# Patient Record
Sex: Male | Born: 1990
Health system: Southern US, Community
[De-identification: ages and names within clinical notes are randomized; demographics above are authoritative.]

## PROBLEM LIST (undated history)

## (undated) ENCOUNTER — Emergency Department (HOSPITAL_BASED_OUTPATIENT_CLINIC_OR_DEPARTMENT_OTHER): Admission: EM | Payer: Medicaid Other | Source: Home / Self Care

## (undated) DIAGNOSIS — Z765 Malingerer [conscious simulation]: Secondary | ICD-10-CM

## (undated) DIAGNOSIS — D571 Sickle-cell disease without crisis: Secondary | ICD-10-CM

## (undated) DIAGNOSIS — F17200 Nicotine dependence, unspecified, uncomplicated: Secondary | ICD-10-CM

## (undated) DIAGNOSIS — G894 Chronic pain syndrome: Secondary | ICD-10-CM

## (undated) DIAGNOSIS — F1910 Other psychoactive substance abuse, uncomplicated: Secondary | ICD-10-CM

## (undated) DIAGNOSIS — F1410 Cocaine abuse, uncomplicated: Secondary | ICD-10-CM

## (undated) HISTORY — PX: CHOLECYSTECTOMY: SHX55

## (undated) HISTORY — DX: Other psychoactive substance abuse, uncomplicated: F19.10

## (undated) HISTORY — DX: Nicotine dependence, unspecified, uncomplicated: F17.200

## (undated) HISTORY — DX: Malingerer (conscious simulation): Z76.5

## (undated) HISTORY — DX: Cocaine abuse, uncomplicated: F14.10

## (undated) HISTORY — DX: Chronic pain syndrome: G89.4

## (undated) HISTORY — PX: OTHER SURGICAL HISTORY: SHX169

---

## 2014-09-23 ENCOUNTER — Emergency Department (HOSPITAL_COMMUNITY): Payer: Self-pay

## 2014-09-23 ENCOUNTER — Emergency Department (HOSPITAL_COMMUNITY)
Admission: EM | Admit: 2014-09-23 | Discharge: 2014-09-24 | Discharge status: Against Medical Advice | Payer: Self-pay | Attending: Emergency Medicine | Admitting: Emergency Medicine

## 2014-09-23 ENCOUNTER — Encounter (HOSPITAL_COMMUNITY): Payer: Self-pay | Admitting: Emergency Medicine

## 2014-09-23 DIAGNOSIS — D5700 Hb-SS disease with crisis, unspecified: Secondary | ICD-10-CM

## 2014-09-23 DIAGNOSIS — J159 Unspecified bacterial pneumonia: Secondary | ICD-10-CM | POA: Insufficient documentation

## 2014-09-23 DIAGNOSIS — Z720 Tobacco use: Secondary | ICD-10-CM | POA: Insufficient documentation

## 2014-09-23 DIAGNOSIS — Z79899 Other long term (current) drug therapy: Secondary | ICD-10-CM | POA: Insufficient documentation

## 2014-09-23 DIAGNOSIS — J189 Pneumonia, unspecified organism: Secondary | ICD-10-CM

## 2014-09-23 LAB — CBC WITH DIFFERENTIAL/PLATELET
Basophils Absolute: 0.2 10*3/uL — ABNORMAL HIGH (ref 0.0–0.1)
Basophils Relative: 1 % (ref 0–1)
EOS ABS: 1.1 10*3/uL — AB (ref 0.0–0.7)
EOS PCT: 6 % — AB (ref 0–5)
HEMATOCRIT: 21.3 % — AB (ref 39.0–52.0)
Hemoglobin: 7.6 g/dL — ABNORMAL LOW (ref 13.0–17.0)
Lymphocytes Relative: 26 % (ref 12–46)
Lymphs Abs: 4.6 10*3/uL — ABNORMAL HIGH (ref 0.7–4.0)
MCH: 32.9 pg (ref 26.0–34.0)
MCHC: 35.7 g/dL (ref 30.0–36.0)
MCV: 92.2 fL (ref 78.0–100.0)
MONO ABS: 1.9 10*3/uL — AB (ref 0.1–1.0)
Monocytes Relative: 11 % (ref 3–12)
NEUTROS ABS: 9.8 10*3/uL — AB (ref 1.7–7.7)
NEUTROS PCT: 56 % (ref 43–77)
Platelets: 301 10*3/uL (ref 150–400)
RBC: 2.31 MIL/uL — AB (ref 4.22–5.81)
RDW: 21.6 % — AB (ref 11.5–15.5)
WBC: 17.6 10*3/uL — ABNORMAL HIGH (ref 4.0–10.5)

## 2014-09-23 LAB — RETICULOCYTES
RBC.: 2.32 MIL/uL — ABNORMAL LOW (ref 4.22–5.81)
RETIC CT PCT: 19.7 % — AB (ref 0.4–3.1)
Retic Count, Absolute: 457 10*3/uL — ABNORMAL HIGH (ref 19.0–186.0)

## 2014-09-23 LAB — URINALYSIS, ROUTINE W REFLEX MICROSCOPIC
Bilirubin Urine: NEGATIVE
GLUCOSE, UA: NEGATIVE mg/dL
Hgb urine dipstick: NEGATIVE
KETONES UR: NEGATIVE mg/dL
Leukocytes, UA: NEGATIVE
NITRITE: NEGATIVE
PROTEIN: NEGATIVE mg/dL
Specific Gravity, Urine: 1.013 (ref 1.005–1.030)
Urobilinogen, UA: 2 mg/dL — ABNORMAL HIGH (ref 0.0–1.0)
pH: 6.5 (ref 5.0–8.0)

## 2014-09-23 MED ORDER — SODIUM CHLORIDE 0.9 % IV BOLUS (SEPSIS)
1000.0000 mL | Freq: Once | INTRAVENOUS | Status: AC
  Administered 2014-09-23: 1000 mL via INTRAVENOUS

## 2014-09-23 MED ORDER — MORPHINE SULFATE 4 MG/ML IJ SOLN
4.0000 mg | Freq: Once | INTRAMUSCULAR | Status: AC
  Administered 2014-09-23: 4 mg via INTRAVENOUS
  Filled 2014-09-23 (×2): qty 1

## 2014-09-23 MED ORDER — DIPHENHYDRAMINE HCL 25 MG PO CAPS
25.0000 mg | ORAL_CAPSULE | Freq: Once | ORAL | Status: AC
  Administered 2014-09-23: 25 mg via ORAL
  Filled 2014-09-23: qty 1

## 2014-09-23 MED ORDER — AZITHROMYCIN 500 MG IV SOLR
500.0000 mg | Freq: Once | INTRAVENOUS | Status: AC
  Administered 2014-09-23: 500 mg via INTRAVENOUS
  Filled 2014-09-23: qty 500

## 2014-09-23 MED ORDER — CEFTRIAXONE SODIUM 1 G IJ SOLR
1.0000 g | Freq: Once | INTRAMUSCULAR | Status: AC
  Administered 2014-09-23: 1 g via INTRAVENOUS
  Filled 2014-09-23: qty 10

## 2014-09-23 MED ORDER — HYDROMORPHONE HCL 1 MG/ML IJ SOLN
1.0000 mg | Freq: Once | INTRAMUSCULAR | Status: AC
  Administered 2014-09-23: 1 mg via INTRAVENOUS
  Filled 2014-09-23: qty 1
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
## 2014-09-23 NOTE — ED Notes (Signed)
Pt requesting more pain medication.

## 2014-09-23 NOTE — ED Notes (Signed)
Pt from home c/o back pain and leg pain. HX of sickle cell. He states "I feel dehydrated". He reports he has not taken any medication at home for pain.

## 2014-09-24 ENCOUNTER — Encounter (HOSPITAL_COMMUNITY): Payer: Self-pay

## 2014-09-24 ENCOUNTER — Emergency Department (HOSPITAL_COMMUNITY): Payer: Medicaid - Out of State

## 2014-09-24 ENCOUNTER — Inpatient Hospital Stay (HOSPITAL_COMMUNITY)
Admission: EM | Admit: 2014-09-24 | Discharge: 2014-09-26 | DRG: 811 | Disposition: A | Discharge status: Home / Self Care | Payer: Medicaid - Out of State | Attending: Internal Medicine | Admitting: Internal Medicine

## 2014-09-24 DIAGNOSIS — Z720 Tobacco use: Secondary | ICD-10-CM | POA: Diagnosis not present

## 2014-09-24 DIAGNOSIS — Z79899 Other long term (current) drug therapy: Secondary | ICD-10-CM

## 2014-09-24 DIAGNOSIS — J189 Pneumonia, unspecified organism: Secondary | ICD-10-CM | POA: Diagnosis present

## 2014-09-24 DIAGNOSIS — D571 Sickle-cell disease without crisis: Secondary | ICD-10-CM

## 2014-09-24 DIAGNOSIS — Z79891 Long term (current) use of opiate analgesic: Secondary | ICD-10-CM | POA: Diagnosis not present

## 2014-09-24 DIAGNOSIS — D5700 Hb-SS disease with crisis, unspecified: Secondary | ICD-10-CM | POA: Diagnosis present

## 2014-09-24 DIAGNOSIS — Z681 Body mass index (BMI) 19 or less, adult: Secondary | ICD-10-CM

## 2014-09-24 DIAGNOSIS — D72829 Elevated white blood cell count, unspecified: Secondary | ICD-10-CM | POA: Diagnosis not present

## 2014-09-24 DIAGNOSIS — R000 Tachycardia, unspecified: Secondary | ICD-10-CM | POA: Diagnosis present

## 2014-09-24 DIAGNOSIS — R0902 Hypoxemia: Secondary | ICD-10-CM

## 2014-09-24 DIAGNOSIS — F17210 Nicotine dependence, cigarettes, uncomplicated: Secondary | ICD-10-CM | POA: Diagnosis present

## 2014-09-24 HISTORY — DX: Sickle-cell disease without crisis: D57.1

## 2014-09-24 LAB — CBC WITH DIFFERENTIAL/PLATELET
BASOS PCT: 0 % (ref 0–1)
Basophils Absolute: 0 10*3/uL (ref 0.0–0.1)
EOS PCT: 6 % — AB (ref 0–5)
Eosinophils Absolute: 1 10*3/uL — ABNORMAL HIGH (ref 0.0–0.7)
HCT: 22.5 % — ABNORMAL LOW (ref 39.0–52.0)
HEMOGLOBIN: 7.8 g/dL — AB (ref 13.0–17.0)
LYMPHS PCT: 24 % (ref 12–46)
Lymphs Abs: 3.9 10*3/uL (ref 0.7–4.0)
MCH: 32 pg (ref 26.0–34.0)
MCHC: 34.7 g/dL (ref 30.0–36.0)
MCV: 92.2 fL (ref 78.0–100.0)
MONO ABS: 1.4 10*3/uL — AB (ref 0.1–1.0)
Monocytes Relative: 9 % (ref 3–12)
NEUTROS PCT: 61 % (ref 43–77)
Neutro Abs: 9.8 10*3/uL — ABNORMAL HIGH (ref 1.7–7.7)
Platelets: 307 10*3/uL (ref 150–400)
RBC: 2.44 MIL/uL — ABNORMAL LOW (ref 4.22–5.81)
RDW: 21.4 % — ABNORMAL HIGH (ref 11.5–15.5)
WBC: 16.1 10*3/uL — ABNORMAL HIGH (ref 4.0–10.5)

## 2014-09-24 LAB — BASIC METABOLIC PANEL
Anion gap: 8 (ref 5–15)
BUN: 5 mg/dL — AB (ref 6–20)
CHLORIDE: 107 mmol/L (ref 101–111)
CO2: 24 mmol/L (ref 22–32)
Calcium: 9.3 mg/dL (ref 8.9–10.3)
Creatinine, Ser: 0.53 mg/dL — ABNORMAL LOW (ref 0.61–1.24)
GFR calc Af Amer: >60 mL/min (ref >60)
GFR calc non Af Amer: >60 mL/min (ref >60)
GLUCOSE: 89 mg/dL (ref 65–99)
POTASSIUM: 3.6 mmol/L (ref 3.5–5.1)
Sodium: 139 mmol/L (ref 135–145)

## 2014-09-24 LAB — HEPATIC FUNCTION PANEL
ALT: 15 U/L — ABNORMAL LOW (ref 17–63)
AST: 22 U/L (ref 15–41)
Albumin: 4.2 g/dL (ref 3.5–5.0)
Alkaline Phosphatase: 82 U/L (ref 38–126)
BILIRUBIN DIRECT: 0.2 mg/dL (ref 0.1–0.5)
BILIRUBIN TOTAL: 3.5 mg/dL — AB (ref 0.3–1.2)
Indirect Bilirubin: 3.3 mg/dL — ABNORMAL HIGH (ref 0.3–0.9)
Total Protein: 7.8 g/dL (ref 6.5–8.1)

## 2014-09-24 LAB — RETICULOCYTES
RBC.: 2.44 MIL/uL — ABNORMAL LOW (ref 4.22–5.81)
Retic Count, Absolute: 468.5 10*3/uL — ABNORMAL HIGH (ref 19.0–186.0)
Retic Ct Pct: 19.2 % — ABNORMAL HIGH (ref 0.4–3.1)

## 2014-09-24 LAB — LACTATE DEHYDROGENASE: LDH: 237 U/L — AB (ref 98–192)

## 2014-09-24 MED ORDER — ENOXAPARIN SODIUM 40 MG/0.4ML ~~LOC~~ SOLN
40.0000 mg | SUBCUTANEOUS | Status: DC
  Filled 2014-09-24 (×2): qty 0.4

## 2014-09-24 MED ORDER — DIPHENHYDRAMINE HCL 25 MG PO CAPS
50.0000 mg | ORAL_CAPSULE | Freq: Once | ORAL | Status: AC
  Administered 2014-09-24: 50 mg via ORAL
  Filled 2014-09-24: qty 2

## 2014-09-24 MED ORDER — HYDROMORPHONE HCL 1 MG/ML IJ SOLN
1.0000 mg | Freq: Once | INTRAMUSCULAR | Status: DC
Start: 2014-09-24 — End: 2014-09-25

## 2014-09-24 MED ORDER — DIPHENHYDRAMINE HCL 12.5 MG/5ML PO ELIX: 12.5000 mg | ORAL_SOLUTION | Freq: Four times a day (QID) | ORAL | Status: DC | PRN

## 2014-09-24 MED ORDER — NALOXONE HCL 0.4 MG/ML IJ SOLN: 0.4000 mg | INTRAMUSCULAR | Status: DC | PRN

## 2014-09-24 MED ORDER — DEXTROSE 5 % IV SOLN
500.0000 mg | Freq: Once | INTRAVENOUS | Status: AC
  Administered 2014-09-24: 500 mg via INTRAVENOUS
  Filled 2014-09-24: qty 500

## 2014-09-24 MED ORDER — DIPHENHYDRAMINE HCL 50 MG/ML IJ SOLN
12.5000 mg | Freq: Four times a day (QID) | INTRAMUSCULAR | Status: DC | PRN
  Administered 2014-09-25 (×2): 12.5 mg via INTRAVENOUS
  Filled 2014-09-24 (×2): qty 1

## 2014-09-24 MED ORDER — FOLIC ACID 1 MG PO TABS
1.0000 mg | ORAL_TABLET | Freq: Every day | ORAL | Status: DC
  Administered 2014-09-24 – 2014-09-26 (×3): 1 mg via ORAL
  Filled 2014-09-24 (×3): qty 1

## 2014-09-24 MED ORDER — HYDROMORPHONE HCL 1 MG/ML IJ SOLN
1.0000 mg | Freq: Once | INTRAMUSCULAR | Status: AC
  Administered 2014-09-24: 1 mg via INTRAVENOUS
  Filled 2014-09-24: qty 1

## 2014-09-24 MED ORDER — SODIUM CHLORIDE 0.9 % IV BOLUS (SEPSIS)
1000.0000 mL | Freq: Once | INTRAVENOUS | Status: AC
  Administered 2014-09-24: 1000 mL via INTRAVENOUS

## 2014-09-24 MED ORDER — HYDROMORPHONE HCL 1 MG/ML IJ SOLN: 1.0000 mg | INTRAMUSCULAR | Status: DC | PRN

## 2014-09-24 MED ORDER — ACETAMINOPHEN 325 MG PO TABS: 650.0000 mg | ORAL_TABLET | Freq: Four times a day (QID) | ORAL | Status: DC | PRN

## 2014-09-24 MED ORDER — DEXTROSE 5 % IV SOLN
1.0000 g | INTRAVENOUS | Status: DC
  Filled 2014-09-24: qty 10

## 2014-09-24 MED ORDER — SODIUM CHLORIDE 0.9 % IV SOLN: INTRAVENOUS | Status: DC

## 2014-09-24 MED ORDER — DEXTROSE 5 % IV SOLN
1.0000 g | Freq: Once | INTRAVENOUS | Status: AC
  Administered 2014-09-24: 1 g via INTRAVENOUS
  Filled 2014-09-24: qty 10

## 2014-09-24 MED ORDER — SODIUM CHLORIDE 0.9 % IJ SOLN
3.0000 mL | Freq: Two times a day (BID) | INTRAMUSCULAR | Status: DC
  Administered 2014-09-24 – 2014-09-25 (×2): 3 mL via INTRAVENOUS

## 2014-09-24 MED ORDER — OXYCODONE HCL 5 MG PO TABS
10.0000 mg | ORAL_TABLET | ORAL | Status: DC
  Administered 2014-09-24 – 2014-09-25 (×4): 10 mg via ORAL
  Filled 2014-09-24 (×4): qty 2

## 2014-09-24 MED ORDER — SODIUM CHLORIDE 0.9 % IV SOLN
INTRAVENOUS | Status: DC
  Administered 2014-09-24: 20:00:00 via INTRAVENOUS

## 2014-09-24 MED ORDER — ACETAMINOPHEN 650 MG RE SUPP
650.0000 mg | Freq: Four times a day (QID) | RECTAL | Status: DC | PRN
Start: 2014-09-24 — End: 2014-09-26

## 2014-09-24 MED ORDER — SODIUM CHLORIDE 0.9 % IJ SOLN: 9.0000 mL | INTRAMUSCULAR | Status: DC | PRN

## 2014-09-24 MED ORDER — AZITHROMYCIN 500 MG PO TABS
500.0000 mg | ORAL_TABLET | ORAL | Status: DC
  Filled 2014-09-24: qty 1

## 2014-09-24 MED ORDER — ONDANSETRON HCL 4 MG/2ML IJ SOLN: 4.0000 mg | Freq: Four times a day (QID) | INTRAMUSCULAR | Status: DC | PRN

## 2014-09-24 MED ORDER — HYDROMORPHONE 0.3 MG/ML IV SOLN
INTRAVENOUS | Status: DC
  Administered 2014-09-24: 0.3 mg via INTRAVENOUS
  Administered 2014-09-25: 2.4 mg via INTRAVENOUS
  Administered 2014-09-25: 3 mg via INTRAVENOUS
  Administered 2014-09-25: 0.9 mg via INTRAVENOUS
  Administered 2014-09-25: 4.2 mg via INTRAVENOUS
  Administered 2014-09-25: 2.1 mg via INTRAVENOUS
  Filled 2014-09-24 (×2): qty 25

## 2014-09-24 NOTE — ED Notes (Signed)
Pt can go at 18:25
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
## 2014-09-24 NOTE — ED Notes (Signed)
MD at bedside. 

## 2014-09-24 NOTE — Progress Notes (Addendum)
24 yr old [place] of [place] moved to [place] in the last week  Reports support system is his step father  Pt confirms no pcp and he has not made attempt to change medicaid from [facility] to [facility]  CM discussed with pt that he does need to go to or call DSS to assist with coverage prior to being changed to uninsured [employer]  CM discussed and provided written information for uninsured and medicaid accepting pcps, [facility] contact information, discussed the importance of pcp vs EDP services for f/u care, www.[email or web address], www.goodrx.com, discounted pharmacies and other [place] resources such as [employer] [facility] [facility], financial assistance,DSS and  health department  Reviewed resources for [employer] accepting pcps like [facility], family medicine at [facility], [facility], [facility], local urgent care centers, [facility], [facility] family practice, general medical clinics, family services of the [place], [facility] urgent care plus others, medication resources, [facility] out [facility] and housing Pt voiced understanding and appreciation of resources provided

## 2014-09-24 NOTE — ED Notes (Signed)
Pt seen yesterday.  Pt was given meds and sent home.  Pt has had back and leg pain x 3 days.  Sickle cell.  No shortness of breath or chest pain
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
## 2014-09-24 NOTE — ED Notes (Signed)
EDP spoke with pt about being admitted for sickle cell crisis and pneumonia. Pt refused. Pt states he understands the risks of refusing treatment. Pt understands the complications of pneumonia as well as sickle cell disease.

## 2014-09-24 NOTE — Progress Notes (Signed)
   I went to see the patient and introduced myself. I told him that the emergency room providers that saw him earlier have recommended for him to stay admitted at the hospital to treat his pneumonia and sickle cell crisis. He declined to be admitted and stated that he wanted to try to outpatient therapy first. He understands the risks of not getting treatment in the hospital, but states that if he fails outpatient treatment and he will return to the ED as soon as possible. I notified the provider that saw him earlier in the ED, Joycie Peek, PA-C, of the patient's decision.

## 2014-09-25 ENCOUNTER — Inpatient Hospital Stay (HOSPITAL_COMMUNITY): Payer: Medicaid - Out of State

## 2014-09-25 DIAGNOSIS — D5700 Hb-SS disease with crisis, unspecified: Principal | ICD-10-CM

## 2014-09-25 DIAGNOSIS — D72829 Elevated white blood cell count, unspecified: Secondary | ICD-10-CM

## 2014-09-25 DIAGNOSIS — Z720 Tobacco use: Secondary | ICD-10-CM

## 2014-09-25 DIAGNOSIS — J189 Pneumonia, unspecified organism: Secondary | ICD-10-CM

## 2014-09-25 DIAGNOSIS — R0902 Hypoxemia: Secondary | ICD-10-CM

## 2014-09-25 LAB — HIV ANTIBODY (ROUTINE TESTING W REFLEX): HIV SCREEN 4TH GENERATION: NONREACTIVE

## 2014-09-25 LAB — COMPREHENSIVE METABOLIC PANEL
ALT: 12 U/L — AB (ref 17–63)
ANION GAP: 4 — AB (ref 5–15)
AST: 17 U/L (ref 15–41)
Albumin: 3.7 g/dL (ref 3.5–5.0)
Alkaline Phosphatase: 78 U/L (ref 38–126)
BUN: 5 mg/dL — ABNORMAL LOW (ref 6–20)
CHLORIDE: 109 mmol/L (ref 101–111)
CO2: 27 mmol/L (ref 22–32)
Calcium: 8.9 mg/dL (ref 8.9–10.3)
Creatinine, Ser: 0.52 mg/dL — ABNORMAL LOW (ref 0.61–1.24)
GFR calc non Af Amer: >60 mL/min (ref >60)
Glucose, Bld: 88 mg/dL (ref 65–99)
POTASSIUM: 3.5 mmol/L (ref 3.5–5.1)
SODIUM: 140 mmol/L (ref 135–145)
Total Bilirubin: 3.3 mg/dL — ABNORMAL HIGH (ref 0.3–1.2)
Total Protein: 6.9 g/dL (ref 6.5–8.1)

## 2014-09-25 LAB — CBC
HCT: 19.9 % — ABNORMAL LOW (ref 39.0–52.0)
HEMOGLOBIN: 7 g/dL — AB (ref 13.0–17.0)
MCH: 32.6 pg (ref 26.0–34.0)
MCHC: 35.2 g/dL (ref 30.0–36.0)
MCV: 92.6 fL (ref 78.0–100.0)
Platelets: 262 10*3/uL (ref 150–400)
RBC: 2.15 MIL/uL — AB (ref 4.22–5.81)
RDW: 21.5 % — ABNORMAL HIGH (ref 11.5–15.5)
WBC: 17.3 10*3/uL — ABNORMAL HIGH (ref 4.0–10.5)

## 2014-09-25 MED ORDER — SODIUM CHLORIDE 0.9 % IV SOLN
25.0000 mg | INTRAVENOUS | Status: DC | PRN
  Administered 2014-09-25 – 2014-09-26 (×3): 25 mg via INTRAVENOUS
  Filled 2014-09-25 (×6): qty 0.5

## 2014-09-25 MED ORDER — POLYETHYLENE GLYCOL 3350 17 G PO PACK: 17.0000 g | PACK | Freq: Every day | ORAL | Status: DC | PRN

## 2014-09-25 MED ORDER — KETOROLAC TROMETHAMINE 30 MG/ML IJ SOLN
30.0000 mg | Freq: Four times a day (QID) | INTRAMUSCULAR | Status: DC
  Filled 2014-09-25 (×5): qty 1

## 2014-09-25 MED ORDER — LEVOFLOXACIN 750 MG PO TABS
750.0000 mg | ORAL_TABLET | Freq: Every day | ORAL | Status: DC
  Administered 2014-09-25 – 2014-09-26 (×2): 750 mg via ORAL
  Filled 2014-09-25 (×2): qty 1

## 2014-09-25 MED ORDER — SENNOSIDES-DOCUSATE SODIUM 8.6-50 MG PO TABS
1.0000 | ORAL_TABLET | Freq: Two times a day (BID) | ORAL | Status: DC
  Administered 2014-09-25 (×2): 1 via ORAL
  Filled 2014-09-25 (×3): qty 1

## 2014-09-25 MED ORDER — HYDROMORPHONE HCL 1 MG/ML IJ SOLN
1.0000 mg | INTRAMUSCULAR | Status: DC | PRN
  Administered 2014-09-25 – 2014-09-26 (×4): 1 mg via INTRAVENOUS
  Filled 2014-09-25 (×5): qty 1

## 2014-09-25 MED ORDER — DIPHENHYDRAMINE HCL 25 MG PO CAPS
25.0000 mg | ORAL_CAPSULE | Freq: Four times a day (QID) | ORAL | Status: DC | PRN
  Filled 2014-09-25: qty 1

## 2014-09-25 MED ORDER — OXYCODONE HCL 5 MG PO TABS
10.0000 mg | ORAL_TABLET | ORAL | Status: DC
  Administered 2014-09-25 – 2014-09-26 (×7): 10 mg via ORAL
  Filled 2014-09-25 (×7): qty 2

## 2014-09-25 NOTE — Progress Notes (Signed)
SICKLE CELL SERVICE PROGRESS NOTE  Matthew Shannon JYN:[id number] DOB: 06-21-1990 DOA: 09/24/2014 PCP: No primary care provider on file.  Assessment/Plan: Principal Problem:   Sickle cell pain crisis Active Problems:   Community acquired pneumonia   Tobacco abuse   CAP (community acquired pneumonia)  1. Hb SS with crisis  Pt reports Hb [place] which is unverified at this time. He reports that he has pain localized to his low back, throbbing in nature and at an intensity of 4/10 which    Is his usual baseline. He has used 6.3 mg with 123/21:demands/deliveries. I will add Toradol, Schedule Oxycodone 10 mg every 4 hours.and decrease IVF as patient is eating and drinking well. He is requesting to be discharged before 1:00pm this afternoon as he has his Grandmother's  funeral to attend. I will re-assess in 3 hours and make a decision about discharge. Will also obtain electrophorsis for establishing diagnosis and baseline Hb type levels. 2. Suspect CAP: Pt has an elevated WBC and had a CXR which showed scarring VS infiltrate. He denies cough or fever. However he was started on Azithromycin and Rocephin. I will de-escalate therapy to Levaquin as his severity of illness does not warrant IV antibiotics. 3. Tobacco Use Disorder: Pt smokes about 4 cigarettes/day. He has been counseled against tobacco use particularly in the context of his disease.  4. Psychosocial: Pt reports that he moved from [facility] to [place] in the last week so that he can be a patient of the [facility] as he has been discharged from the care of his Hematologist Dr. Henry Russel because of violation of his pain medication contract.   Code Status: Full Code Family Communication: N/A Disposition Plan: Will re-assess fro discharge this afternoon.  MATTHEWS,[name]  Pager [phone]. If 7PM-7AM, please contact night-coverage.  09/25/2014, 9:47 AM  LOS: 1 day  Pertinent  History: Pt reports a h/o Hb SS with hospitalizations about 4-5 x year. He  denies any h/o acute chest syndrome, Splenic sequestration,. Priapism. He does not know his baseline Hb, WBC or reticulocyte count. I spoke Dr. Henry Russel at [facility] who verifies that patient has HB SS and not Hb [place] a originally reported by patient. We will obtain records to document his baseline indices.  Consultants:  None  Procedures:  None  Antibiotics:  Azithromycin 8/15 >>8/16  Rocephin 8/15 >>8/16  Levaquin 8/16 >>  HPI/Subjective: Pt reports pain currently at 4/10 ( baseline) and localized to back and legs. Throbbing and non-radiating in nature.  He denies cough, fever or chills. He is requesting discharge befor 1:00pm today to attend his Grandmother's funeral.   Objective: Filed Vitals:   09/25/14 0400 09/25/14 0426 09/25/14 0443 09/25/14 0815  BP:  104/62    Pulse:  78    Temp:  97.9 F (36.6 C)    TempSrc:  Oral    Resp: Height:      Weight:      SpO2: 99% 96% 99% 99%   Weight change:   Intake/Output Summary (Last 24 hours) at 09/25/14 0947 Last data filed at 09/25/14 0654  Gross per 24 hour  Intake 813.75 ml  Output      0 ml  Net 813.75 ml    General: Alert, awake, oriented x3, in no acute distress.  HEENT: /AT PEERL, EOMI, anicteric Neck: Trachea midline,  no masses, no thyromegal,y no JVD, no carotid bruit OROPHARYNX:  Moist, No exudate/ erythema/lesions.  Heart: Regular rate  and rhythm, without murmurs, rubs, gallops, PMI non-displaced, no heaves or thrills on palpation.  Lungs: Crackles noted at RLL. No wheezing, no increased vocal fremitus. Lungs are resonant to percussion  Abdomen: Soft, nontender, nondistended, positive bowel sounds, no masses no hepatosplenomegaly noted.  Neuro: No focal neurological deficits noted cranial nerves II through XII grossly intact.  Strength 5/5 in bilateral upper and lower extremities. Musculoskeletal: No warm swelling or erythema around joints, no spinal tenderness  noted. Psychiatric: Patient alert and oriented x3, good insight and cognition, good recent to remote recall. Lymph node survey: No cervical axillary or inguinal lymphadenopathy noted.   Data Reviewed: Basic Metabolic Panel:  Recent Labs Lab 09/24/14 1535 09/25/14 0425  NA 139 140  K 3.6 3.5  CL 107 109  CO2 24 27  GLUCOSE 89 88  BUN 5* 5*  CREATININE 0.53* 0.52*  CALCIUM 9.3 8.9   Liver Function Tests:  Recent Labs Lab 09/24/14 1535 09/25/14 0425  AST 22 17  ALT 15* 12*  ALKPHOS 82 78  BILITOT 3.5* 3.3*  PROT 7.8 6.9  ALBUMIN 4.2 3.7   No results for input(s): LIPASE, AMYLASE in the last 168 hours. No results for input(s): AMMONIA in the last 168 hours. CBC:  Recent Labs Lab 09/23/14 2015 09/24/14 1535 09/25/14 0425  WBC 17.6* 16.1* 17.3*  NEUTROABS 9.8* 9.8*  --   HGB 7.6* 7.8* 7.0*  HCT 21.3* 22.5* 19.9*  MCV 92.2 92.2 92.6  PLT 301 307 262   Cardiac Enzymes: No results for input(s): CKTOTAL, CKMB, CKMBINDEX, TROPONINI in the last 168 hours. BNP (last 3 results) No results for input(s): BNP in the last 8760 hours.  ProBNP (last 3 results) No results for input(s): PROBNP in the last 8760 hours.  CBG: No results for input(s): GLUCAP in the last 168 hours.  No results found for this or any previous visit (from the past 240 hour(s)).   Studies: Dg Chest 2 View  09/24/2014   CLINICAL DATA:  Back pain for 3 days. Nonproductive cough. History of sickle cell disease.  EXAM: CHEST  2 VIEW  COMPARISON:  09/23/2014  FINDINGS: Heart is mildly enlarged. Airspace opacity in the right lung base is similar to prior study. This could be chronic, but cannot exclude developing infiltrate. Left lung is clear. No effusions. No acute bony abnormality.  IMPRESSION: Stable opacity at the right lung base of unknown chronicity. This could reflect scarring or developing infiltrate.   Electronically Signed   By: Charlett Nose M.D.   On: 09/24/2014 16:23   Dg Chest 2  View  09/23/2014   CLINICAL DATA:  Wheezing and congestion for 2 days  EXAM: CHEST - 2 VIEW  COMPARISON:  None.  FINDINGS: Cardiac shadow is within normal limits. A mild scoliosis concave to the left is noted in the mid thoracic spine. The lungs are well aerated bilaterally. Slight increased density is noted in the right lung base projecting in the lower lobe consistent with early infiltrate. No sizable effusion is seen. No other bony abnormality is noted.  IMPRESSION: Early infiltrate in the right lower lobe.   Electronically Signed   By: Alcide Clever M.D.   On: 09/23/2014 21:42    Scheduled Meds: . enoxaparin (LOVENOX) injection  40 mg Subcutaneous Q24H  . folic acid  1 mg Oral Daily  . HYDROmorphone PCA 0.3 mg/mL   Intravenous 6 times per day  . levofloxacin  750 mg Oral Daily  . oxyCODONE  10 mg Oral  Q4H  . senna-docusate  1 tablet Oral BID  . sodium chloride  3 mL Intravenous Q12H   Continuous Infusions: . sodium chloride 75 mL/hr at 09/24/14 2003   Time spent 40 minutes.

## 2014-09-25 NOTE — Progress Notes (Signed)
RN paged this NP secondary to pt leaving the floor multiple times to smoke in spite of being told not to. Both times, the RN has discussed the risks associated with him leaving the floor. He apologizes and does it again. Apparently, he did this yesterday as well. This NP communicated with hospital [facility], [place] Dec. Huntley Dec is going to talk with pt. Security is presently looking for the pt. In my opinion, if he is well enough to walk outside to smoke, he may be well enough for discharge. Jimmye Norman, NP [facility]

## 2014-09-25 NOTE — Care Management Note (Signed)
Case Management Note  Patient Details  Name: Matthew Shannon MRN: [id number] Date of Birth: 1990-02-19  Subjective/Objective:  24 y/o m admitted w/SSC. Recent move to .No pcp. Noted ED CM has provided w/pcp listing,med asst resources,& community resources.  [facility] to [facility] for pcp,& connection w/resources.                  Action/Plan:d/c plan home.   Expected Discharge Date:   (unknown)               Expected Discharge Plan:  Home/Self Care  In-House Referral:     Discharge planning Services  CM Consult  Post Acute Care Choice:    Choice offered to:     DME Arranged:    DME Agency:     HH Arranged:    HH Agency:     Status of Service:  In process, will continue to follow  Medicare Important Message Given:    Date Medicare IM Given:    Medicare IM give by:    Date Additional Medicare IM Given:    Additional Medicare Important Message give by:     If discussed at Long Length of Stay Meetings, dates discussed:    Additional Comments:  Lanier Clam, RN 09/25/2014, 3:26 PM

## 2014-09-25 NOTE — Progress Notes (Signed)
Entered patient room to round with Dr. Ashley Royalty. Patient had unhooked himself from the PCA pump and heart monitor to take a shower. RN was unaware of this. Educated patient on importance of not changing pump settings and the importance of not taking heart monitor off. Explained risk of infection when he disconnects himself from patient. Will continue to monitor patient.

## 2014-09-25 NOTE — Progress Notes (Signed)
Came back to re-assess patietn for possible discharge per his request. Pt had a decrease in saturation from 100% to 92% with ambulation and increase in HR to 110. In light of the possible developing infiltrate on CXR and  relative hypoxemia in a setting of Hb SS with crisis. I feel that his medical risk requires continued hospitalization. Additionally he is still requiring IV dilaudid in addition to oral Oxycodone to control pain. Will continue current treatment and re-assess tomorrow. All of the above discussed with patient in the presence of bedside nurse.

## 2014-09-25 NOTE — Progress Notes (Signed)
SATURATION QUALIFICATIONS: (This note is used to comply with regulatory documentation for home oxygen)  Patient Saturations on Room Air at Rest = 100%  Patient Saturations on Room Air while Ambulating = 92%  Patient Saturations on 0 Liters of oxygen while Ambulating = 92%  Please briefly explain why patient needs home oxygen: 

## 2014-09-25 NOTE — Progress Notes (Signed)
Talked with [name] about leaving the floor. I explained to him that when he signed in for treatment he was agreeing to follow the rules of [employer]. If he continues to leave the floor and not follow the rules, he will be asked to leave. He was told that his continuing treatment and care would be up to him. The patient voiced understanding and agrees to follow the rules. Verdene Rio, RN was present during this conversation.

## 2014-09-25 NOTE — Progress Notes (Signed)
Sent request to obtain medical records from patient previous HCP. Awaiting response.

## 2014-09-26 DIAGNOSIS — R000 Tachycardia, unspecified: Secondary | ICD-10-CM | POA: Diagnosis present

## 2014-09-26 LAB — BASIC METABOLIC PANEL
Anion gap: 7 (ref 5–15)
BUN: 5 mg/dL — ABNORMAL LOW (ref 6–20)
CO2: 26 mmol/L (ref 22–32)
Calcium: 8.8 mg/dL — ABNORMAL LOW (ref 8.9–10.3)
Chloride: 106 mmol/L (ref 101–111)
Creatinine, Ser: 0.49 mg/dL — ABNORMAL LOW (ref 0.61–1.24)
GFR calc Af Amer: >60 mL/min (ref >60)
GFR calc non Af Amer: >60 mL/min (ref >60)
Glucose, Bld: 82 mg/dL (ref 65–99)
Potassium: 3.6 mmol/L (ref 3.5–5.1)
Sodium: 139 mmol/L (ref 135–145)

## 2014-09-26 LAB — CBC WITH DIFFERENTIAL/PLATELET
BASOS ABS: 0.2 10*3/uL — AB (ref 0.0–0.1)
Basophils Relative: 1 % (ref 0–1)
EOS ABS: 0.9 10*3/uL — AB (ref 0.0–0.7)
EOS PCT: 6 % — AB (ref 0–5)
HCT: 18.5 % — ABNORMAL LOW (ref 39.0–52.0)
Hemoglobin: 6.3 g/dL — CL (ref 13.0–17.0)
LYMPHS ABS: 3.6 10*3/uL (ref 0.7–4.0)
Lymphocytes Relative: 24 % (ref 12–46)
MCH: 31.3 pg (ref 26.0–34.0)
MCHC: 34.1 g/dL (ref 30.0–36.0)
MCV: 92 fL (ref 78.0–100.0)
MONOS PCT: 13 % — AB (ref 3–12)
Monocytes Absolute: 2 10*3/uL — ABNORMAL HIGH (ref 0.1–1.0)
NEUTROS ABS: 8.5 10*3/uL — AB (ref 1.7–7.7)
NEUTROS PCT: 56 % (ref 43–77)
PLATELETS: 272 10*3/uL (ref 150–400)
RBC: 2.01 MIL/uL — AB (ref 4.22–5.81)
RDW: 22.9 % — ABNORMAL HIGH (ref 11.5–15.5)
WBC: 15.2 10*3/uL — AB (ref 4.0–10.5)
nRBC: 3 /100 WBC — ABNORMAL HIGH

## 2014-09-26 LAB — LACTATE DEHYDROGENASE: LDH: 259 U/L — AB (ref 98–192)

## 2014-09-26 MED ORDER — LEVOFLOXACIN 750 MG PO TABS: 750.0000 mg | ORAL_TABLET | Freq: Every day | ORAL | Status: DC

## 2014-09-26 MED ORDER — OXYCODONE HCL 10 MG PO TABS: 10.0000 mg | ORAL_TABLET | ORAL | Status: DC

## 2014-09-26 NOTE — Progress Notes (Signed)
Pt ambulated 200 ft in hall.  O2 sat was 96%, HR 86 at rest on RA.  O2 sat was 90-95% during ambulation with a HR of 88-99.  No complaints of SOB during ambulation.

## 2014-09-26 NOTE — Progress Notes (Signed)
CRITICAL VALUE ALERT  Critical value received:  6:39 AM  Date of notification:  09/26/2014  Time of notification:  6:38 AM  Critical value read back:Yes.    Nurse who received alert:  Rockne Coons   MD notified (1st page):  K.Kirby    Time of first page:  6:39 AM  MD notified (2nd page): K.Kirby  Time of second page: 6:45AM  Responding MD:    Time MD responded:

## 2014-09-26 NOTE — Care Management Note (Signed)
Case Management Note  Patient Details  Name: Kasai Beltran MRN: [id number] Date of Birth: March 22, 1990  Subjective/Objective:                    Action/Plan:d/c ome no needs or orders.   Expected Discharge Date:   (unknown)               Expected Discharge Plan:  Home/Self Care  In-House Referral:     Discharge planning Services  CM Consult  Post Acute Care Choice:    Choice offered to:     DME Arranged:    DME Agency:     HH Arranged:    HH Agency:     Status of Service:  Completed, signed off  Medicare Important Message Given:    Date Medicare IM Given:    Medicare IM give by:    Date Additional Medicare IM Given:    Additional Medicare Important Message give by:     If discussed at Long Length of Stay Meetings, dates discussed:    Additional Comments:  Lanier Clam, RN 09/26/2014, 10:20 AM

## 2014-09-26 NOTE — Discharge Summary (Signed)
Matthew Shannon MRN: [id number] DOB/AGE: 1990/06/03 24 y.o.  Admit date: 09/24/2014 Discharge date: 09/26/2014  Primary Care Physician:  No primary care provider on file.   Discharge Diagnoses:   Patient Active Problem List   Diagnosis Date Noted  . Tachycardia 09/26/2014  . Sickle cell pain crisis 09/24/2014  . Community acquired pneumonia 09/24/2014  . Tobacco abuse 09/24/2014  . CAP (community acquired pneumonia) 09/24/2014    DISCHARGE MEDICATION:   Medication List    STOP taking these medications        oxyCODONE-acetaminophen 5-325 MG per tablet  Commonly known as:  PERCOCET/ROXICET      TAKE these medications        folic acid 1 MG tablet  Commonly known as:  FOLVITE  Take 1 mg by mouth daily.     levofloxacin 750 MG tablet  Commonly known as:  LEVAQUIN  Take 1 tablet (750 mg total) by mouth daily.     Oxycodone HCl 10 MG Tabs  Take 1 tablet (10 mg total) by mouth every 4 (four) hours.          Consults:     SIGNIFICANT DIAGNOSTIC STUDIES:  Dg Chest 2 View  09/24/2014   CLINICAL DATA:  Back pain for 3 days. Nonproductive cough. History of sickle cell disease.  EXAM: CHEST  2 VIEW  COMPARISON:  09/23/2014  FINDINGS: Heart is mildly enlarged. Airspace opacity in the right lung base is similar to prior study. This could be chronic, but cannot exclude developing infiltrate. Left lung is clear. No effusions. No acute bony abnormality.  IMPRESSION: Stable opacity at the right lung base of unknown chronicity. This could reflect scarring or developing infiltrate.   Electronically Signed   By: Charlett Nose M.D.   On: 09/24/2014 16:23   Dg Chest 2 View  09/23/2014   CLINICAL DATA:  Wheezing and congestion for 2 days  EXAM: CHEST - 2 VIEW  COMPARISON:  None.  FINDINGS: Cardiac shadow is within normal limits. A mild scoliosis concave to the left is noted in the mid thoracic spine. The lungs are well aerated bilaterally. Slight increased density is noted in the right lung  base projecting in the lower lobe consistent with early infiltrate. No sizable effusion is seen. No other bony abnormality is noted.  IMPRESSION: Early infiltrate in the right lower lobe.   Electronically Signed   By: Alcide Clever M.D.   On: 09/23/2014 21:42   Dg Chest Port 1 View  09/25/2014   CLINICAL DATA:  Sickle cell anemia with current hypoxic episode, patient smokes  EXAM: PORTABLE CHEST - 1 VIEW  COMPARISON:  09/24/2014  FINDINGS: Sigmoid scoliotic curvature of the thoracolumbar spine stable. Mild to moderate cardiac enlargement stable. Moderate vascular congestion. Mild diffuse interstitial process bilaterally, increased in conspicuity when compared to prior study. No pleural effusions.  IMPRESSION: Cardiac enlargement with vascular congestion stable from prior study. Mildly increased diffuse interstitial opacification. No Ker[place] B-lines suggesting facial pneumonia however. Findings may be due to sickle cell crisis. Other possibilities include atypical pneumonia or progressive interstitial lung disease.   Electronically Signed   By: Esperanza Heir M.D.   On: 09/25/2014 21:47       Recent Results (from the past 240 hour(s))  Culture, blood (routine x 2) Call MD if unable to obtain prior to antibiotics being given     Status: None (Preliminary result)   Collection Time: 09/24/14  8:15 PM  Result Value Ref Range Status   Specimen  Description BLOOD LEFT ARM  Final   Special Requests BOTTLES DRAWN AEROBIC AND ANAEROBIC 5CC  Final   Culture   Final    NO GROWTH < 24 HOURS Performed at [facility]    Report Status PENDING  Incomplete  Culture, blood (routine x 2) Call MD if unable to obtain prior to antibiotics being given     Status: None (Preliminary result)   Collection Time: 09/24/14  8:20 PM  Result Value Ref Range Status   Specimen Description BLOOD RIGHT HAND  Final   Special Requests BOTTLES DRAWN AEROBIC AND ANAEROBIC 10CC  Final   Culture   Final    NO GROWTH < 24  HOURS Performed at [facility]    Report Status PENDING  Incomplete    BRIEF ADMITTING H & P: 24 year old male with history of sickle cell disease, from [place], [place] who is relocating to [place] presented to the ED yesterday with 3-4 days of progressive back pain along with bilateral leg pains typical of his sickle cell pain crisis. Patient reports that he gets 3-4 episodes of pain crisis during a year. He was last hospitalized in [place] in March of this year. Patient came to the ED yesterday with severe pain and was being admitted for sickle cell pain crisis but signed out AMA as he had a death in his family and had to leave. However the pain continued to get worse and he returned to the ED today. He also has been having nonproductive cough but no fevers or chills. Patient denies headache, dizziness,, nausea , vomiting, chest pain, palpitations, SOB, abdominal pain, bowel or urinary symptoms. Denies change in weight or appetite. He does not see a doctor in [place] and goes to the ED during sickle cell pain attacks and is prescribed Percocet by the ED. He reports taking his Percocet last few days without relief of symptoms. In the ED patient had low blood pressure. Remaining vitals were stable. Blood work done showed leukocytosis with obesity ascending 0.6, hemoglobin of 7.6 normal platelets. BMET was normal. Chest x-ray suggested of right lower lobe infiltrate. Patient given 3 mg of IV Dilaudid following which his pain was still 8/10 in severity. He also received IV Rocephin and azithromycin. Hospitalist admission requested.    Hospital Course:  Present on Admission:  . Sickle cell pain crisis: This is an Opiate tolerant patient who was admitted with Sickle Cell Crisis. He was managed intially with IV Dilaudid via PCA and Toradol. As pain improved, he  was transitioned to oral Oxycodone with clinician assisted doses. At the time of discharge his pain is 2/10  which is his baseline. He is discharged home on Oxycodone 10 mg q 4 hours PRN. A prescription was issued for Oxycodone 10 mg #30 tabs.  I spoke with his previous Hematologist Dr. Henry Russel who recalls that patient is Hb SS however patient reports Hb [place]. A consent for record request was faxed to Dr. Michaelle Copas office but not records were received.  Thus an Electrophoresis was ordered ands is in process. He is to follow up in the [facility] on 8/26 at 10:45 am.  . Suspect Community acquired pneumonia: Pr had a possible infiltrate on CXR in the setting of relative hypoxia. He was initially treated with Rocephin and Azithromycin. Therapy was de-escalated to Levaquin and he need 5 more days of therapy to complete course.   . Tobacco abuse disorder: Pt smokes on a daily basis. He has  been counseled against continued smoking particularly in the context of Sickle Cell Disease.  . Tachycardia: Pt has tachycardia and non-specific Telemetry changes. An EKG showed possible LVH. However tachycardia resolved at time of discharge thus this was likely related to pain.  Marland Kitchen Psychosocial: Pt has recently moved here from [facility] where he was last seen for his Sickle Cell Care by Dr. Henry Russel at [facility]. Dr. Katrinka Blazing reports patient to be a very nice young man who tries to comply with care and had been very compliant with Hydrea use during his care. Dr. Katrinka Blazing provided care until age 51. Since leaving the care of Dr. Katrinka Blazing, he has received his care mainly through [facility] as he has not been able to find a Primary Care Provider. Pt states that he moved to [facility] as he heard that there is a Teacher, early years/pre in [place] that knows how to care for his disease. He has been referred to the [facility].   Disposition and Follow-up:  Pt discharged in good condition. He is to follow up with [facility] for an appointment on 10/05/2014. I have advised patient that is he has a crisis  between now and his appointment he can call the [facility] for use of the [facility] as long a he keeps the appointment.     Discharge Instructions    Activity as tolerated - No restrictions    Complete by:  As directed      Diet general    Complete by:  As directed            DISCHARGE EXAM:  General: Alert, awake, oriented x3, in NAD.  Vital Signs: BP 104/57, HR 79, T 98.2 F (36.8 C), temperature source Oral, RR 16, height 5\' 10"  (1.778 m), weight 129 lb 4.8 oz (58.65 kg), SpO2 94 %. HEENT: [place]/AT PEERL, EOMI, anicteric Neck: Trachea midline, no masses, no thyromegal,y no JVD, no carotid bruit OROPHARYNX: Moist, No exudate/ erythema/lesions.  Heart: Regular rate and rhythm, without murmurs, rubs, gallops or S3. PMI non-displaced. Exam reveals no decreased pulses. Pulmonary/Chest: Normal effort. Breath sounds normal. No. Apnea. Clear to auscultation,no stridor,  no wheezing and no rhonchi noted. No respiratory distress and no tenderness noted. Abdomen: Soft, nontender, nondistended, normal bowel sounds, no masses no hepatosplenomegaly noted. No fluid wave and no ascites. There is no guarding or rebound. Neuro: Alert and oriented to person, place and time. Normal motor skills, Displays no atrophy or tremors and exhibits normal muscle tone.  No focal neurological deficits noted cranial nerves II through XII grossly intact. No sensory deficit noted. Strength at baseline in bilateral upper and lower extremities. Gait normal. Musculoskeletal: No warm swelling or erythema around joints, no spinal tenderness noted. Psychiatric: Patient alert and oriented x3, good insight and cognition, good recent to remote recall. Mood, memory, affect and judgement normal     Recent Labs  09/25/14 0425 09/26/14 0510  NA 140 139  K 3.5 3.6  CL 109 106  CO2 27 26  GLUCOSE 88 82  BUN 5* 5*  CREATININE 0.52* 0.49*  CALCIUM 8.9 8.8*    Recent Labs  09/24/14 1535 09/25/14 0425  AST 22  17  ALT 15* 12*  ALKPHOS 82 78  BILITOT 3.5* 3.3*  PROT 7.8 6.9  ALBUMIN 4.2 3.7   No results for input(s): LIPASE, AMYLASE in the last 72 hours.  Recent Labs  09/24/14 1535 09/25/14 0425 09/26/14 0510  WBC 16.1* 17.3* 15.2*  NEUTROABS 9.8*  --  8.5*  HGB 7.8* 7.0* 6.3*  HCT 22.5* 19.9* 18.5*  MCV 92.2 92.6 92.0  PLT 307 262 272     Total time spent including face to face and decision making was greater than 30 minutes  Signed: Leverne Amrhein A. 09/26/2014, 9:54 AM

## 2014-09-28 LAB — HEMOGLOBINOPATHY EVALUATION
HGB A: 0 % — AB (ref 94.0–98.0)
HGB C: 0 %
HGB S QUANTITAION: 92.8 % — AB
Hgb A2 Quant: 4.5 % — ABNORMAL HIGH (ref 0.7–3.1)
Hgb F Quant: 2.7 % — ABNORMAL HIGH (ref 0.0–2.0)

## 2014-09-29 LAB — CULTURE, BLOOD (ROUTINE X 2)
Culture: NO GROWTH
Culture: NO GROWTH

## 2014-10-02 ENCOUNTER — Emergency Department (HOSPITAL_COMMUNITY)
Admission: EM | Admit: 2014-10-02 | Discharge: 2014-10-02 | Disposition: A | Discharge status: Home / Self Care | Payer: Medicaid Other | Attending: Emergency Medicine | Admitting: Emergency Medicine

## 2014-10-02 ENCOUNTER — Encounter (HOSPITAL_COMMUNITY): Payer: Self-pay

## 2014-10-02 DIAGNOSIS — D5700 Hb-SS disease with crisis, unspecified: Secondary | ICD-10-CM | POA: Insufficient documentation

## 2014-10-02 DIAGNOSIS — Z79899 Other long term (current) drug therapy: Secondary | ICD-10-CM | POA: Diagnosis not present

## 2014-10-02 DIAGNOSIS — Z720 Tobacco use: Secondary | ICD-10-CM | POA: Diagnosis not present

## 2014-10-02 DIAGNOSIS — M545 Low back pain: Secondary | ICD-10-CM

## 2014-10-02 LAB — COMPREHENSIVE METABOLIC PANEL
ALT: 13 U/L — ABNORMAL LOW (ref 17–63)
ANION GAP: 6 (ref 5–15)
AST: 30 U/L (ref 15–41)
Albumin: 4.4 g/dL (ref 3.5–5.0)
Alkaline Phosphatase: 82 U/L (ref 38–126)
BUN: <5 mg/dL — ABNORMAL LOW (ref 6–20)
CHLORIDE: 106 mmol/L (ref 101–111)
CO2: 28 mmol/L (ref 22–32)
CREATININE: 0.38 mg/dL — AB (ref 0.61–1.24)
Calcium: 9.1 mg/dL (ref 8.9–10.3)
Glucose, Bld: 78 mg/dL (ref 65–99)
POTASSIUM: 3.9 mmol/L (ref 3.5–5.1)
SODIUM: 140 mmol/L (ref 135–145)
Total Bilirubin: 3 mg/dL — ABNORMAL HIGH (ref 0.3–1.2)
Total Protein: 7.9 g/dL (ref 6.5–8.1)

## 2014-10-02 LAB — CBC WITH DIFFERENTIAL/PLATELET
BASOS ABS: 0.2 10*3/uL — AB (ref 0.0–0.1)
Basophils Relative: 1 % (ref 0–1)
EOS PCT: 4 % (ref 0–5)
Eosinophils Absolute: 0.7 10*3/uL (ref 0.0–0.7)
HCT: 20.1 % — ABNORMAL LOW (ref 39.0–52.0)
HEMOGLOBIN: 6.9 g/dL — AB (ref 13.0–17.0)
LYMPHS ABS: 3 10*3/uL (ref 0.7–4.0)
LYMPHS PCT: 17 % (ref 12–46)
MCH: 32.5 pg (ref 26.0–34.0)
MCHC: 34.3 g/dL (ref 30.0–36.0)
MCV: 94.8 fL (ref 78.0–100.0)
MONOS PCT: 10 % (ref 3–12)
Monocytes Absolute: 1.8 10*3/uL — ABNORMAL HIGH (ref 0.1–1.0)
NEUTROS PCT: 68 % (ref 43–77)
Neutro Abs: 11.8 10*3/uL — ABNORMAL HIGH (ref 1.7–7.7)
Platelets: 350 10*3/uL (ref 150–400)
RBC: 2.12 MIL/uL — AB (ref 4.22–5.81)
RDW: 21.8 % — ABNORMAL HIGH (ref 11.5–15.5)
WBC: 17.5 10*3/uL — AB (ref 4.0–10.5)
nRBC: 2 /100 WBC — ABNORMAL HIGH

## 2014-10-02 LAB — RETICULOCYTES
RBC.: 2.12 MIL/uL — AB (ref 4.22–5.81)
RETIC CT PCT: 21.1 % — AB (ref 0.4–3.1)
Retic Count, Absolute: 447.3 10*3/uL — ABNORMAL HIGH (ref 19.0–186.0)

## 2014-10-02 MED ORDER — HYDROMORPHONE HCL 1 MG/ML IJ SOLN
1.0000 mg | Freq: Once | INTRAMUSCULAR | Status: AC
  Administered 2014-10-02: 1 mg via INTRAVENOUS
  Filled 2014-10-02: qty 1

## 2014-10-02 MED ORDER — DIPHENHYDRAMINE HCL 25 MG PO CAPS
50.0000 mg | ORAL_CAPSULE | Freq: Once | ORAL | Status: AC
  Administered 2014-10-02: 50 mg via ORAL
  Filled 2014-10-02: qty 2

## 2014-10-02 MED ORDER — ONDANSETRON HCL 4 MG/2ML IJ SOLN
4.0000 mg | Freq: Once | INTRAMUSCULAR | Status: AC
  Administered 2014-10-02: 4 mg via INTRAVENOUS
  Filled 2014-10-02: qty 2

## 2014-10-02 MED ORDER — SODIUM CHLORIDE 0.9 % IV BOLUS (SEPSIS)
1000.0000 mL | Freq: Once | INTRAVENOUS | Status: AC
  Administered 2014-10-02: 1000 mL via INTRAVENOUS
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
## 2014-10-02 NOTE — ED Notes (Signed)
Discharge instructions given and reviewed with patient.  Patient verbalized understanding to take medications as directed and to follow up with sickle cell clinic as scheduled.  Patient ambulatory; discharged home in good condition.

## 2014-10-02 NOTE — ED Notes (Signed)
Patient c/o upper, mid and lower back pain  X 2 day. Patient states the pain radiates into bilateral lower extremities. Patient also c/o weakness x 2 days.

## 2014-10-02 NOTE — ED Notes (Signed)
Called into patient's room.  IV site in right forearm noted to have hives and IV site swelling.  NS stopped and IV removed.  Dr. Verdie Mosher aware.

## 2014-10-02 NOTE — ED Notes (Signed)
CRITICAL VALUE ALERT  Critical value received:  Hgb 6.9 Date of notification:  10/02/2014   Time of notification:  1224  Critical value read back:Yes.    Nurse who received alert:  Charlette Caffey, RN  MD notified:  Dr. Verdie Mosher  Time:  12:36 PM

## 2014-10-02 NOTE — Discharge Instructions (Signed)
Please go to your hematologist appointment in 3 days as scheduled, and discuss any further medications as needed. Return without fail for worsening symptoms, including difficulty breathing, chest pain, fevers, vomiting unable to keep down food or fluids, or any other symptoms concerning to you  Sickle Cell Anemia Sickle cell anemia is a condition where your red blood cells are shaped like sickles. Red blood cells carry oxygen through the body. Sickle-shaped red blood cells do not live as long as normal red blood cells. They also clump together and block blood from flowing through the blood vessels. These things prevent the body from getting enough oxygen. Sickle cell anemia causes organ damage and pain. It also increases the risk of infection. HOME CARE  Drink enough fluid to keep your pee (urine) clear or pale yellow. Drink more in hot weather and during exercise.  Do not smoke. Smoking lowers oxygen levels in the blood.  Only take over-the-counter or prescription medicines as told by your doctor.  Take antibiotic medicines as told by your doctor. Make sure you finish them even if you start to feel better.  Take supplements as told by your doctor.  Consider wearing a medical alert bracelet. This tells anyone caring for you in an emergency of your condition.  When traveling, keep your medical information, doctors' names, and the medicines you take with you at all times.  If you have a fever, do not take fever medicines right away. This could cover up a problem. Tell your doctor.   Keep all follow-up visits with your doctor. Sickle cell anemia requires regular medical care. GET HELP IF: You have a fever. GET HELP RIGHT AWAY IF:  You feel dizzy or faint.  You have new belly (abdominal) pain, especially on the left side near the stomach area.  You have a lasting, often uncomfortable and painful erection of the penis (priapism). If it is not treated right away, you will become unable to  have sex (impotence).  You have numbness in your arms or legs or you have a hard time moving them.  You have a hard time talking.  You have a fever or lasting symptoms for more than 2-3 days.  You have a fever and your symptoms suddenly get worse.  You have signs or symptoms of infection. These include:  Chills.  Being more tired than normal (lethargy).  Irritability.  Poor eating.  Throwing up (vomiting).  You have pain that is not helped with medicine.  You have shortness of breath.  You have pain in your chest.  You are coughing up pus-like or bloody mucus.  You have a stiff neck.  Your feet or hands swell or have pain.  Your belly looks bloated.  Your joints hurt. MAKE SURE YOU:  Understand these instructions.  Will watch your condition.  Will get help right away if you are not doing well or get worse. Document Released: 11/16/2012 Document Revised: 06/12/2013 Document Reviewed: 11/16/2012 [facility] Patient Information 2015 [place], [place]. This information is not intended to replace advice given to you by your health care provider. Make sure you discuss any questions you have with your health care provider.

## 2014-10-05 ENCOUNTER — Emergency Department (HOSPITAL_COMMUNITY)
Admission: EM | Admit: 2014-10-05 | Discharge: 2014-10-06 | Disposition: A | Discharge status: Home / Self Care | Payer: Medicaid - Out of State | Attending: Emergency Medicine | Admitting: Emergency Medicine

## 2014-10-05 ENCOUNTER — Ambulatory Visit (INDEPENDENT_AMBULATORY_CARE_PROVIDER_SITE_OTHER): Payer: Medicaid - Out of State | Admitting: Family Medicine

## 2014-10-05 ENCOUNTER — Encounter: Payer: Self-pay | Admitting: Family Medicine

## 2014-10-05 ENCOUNTER — Encounter (HOSPITAL_COMMUNITY): Payer: Self-pay | Admitting: Emergency Medicine

## 2014-10-05 VITALS — BP 110/58 | HR 84 | Temp 98.2°F | Resp 16 | Ht 70.0 in | Wt 128.0 lb

## 2014-10-05 DIAGNOSIS — Z720 Tobacco use: Secondary | ICD-10-CM | POA: Insufficient documentation

## 2014-10-05 DIAGNOSIS — D571 Sickle-cell disease without crisis: Secondary | ICD-10-CM | POA: Diagnosis not present

## 2014-10-05 DIAGNOSIS — Z0000 Encounter for general adult medical examination without abnormal findings: Secondary | ICD-10-CM | POA: Diagnosis not present

## 2014-10-05 DIAGNOSIS — D5700 Hb-SS disease with crisis, unspecified: Secondary | ICD-10-CM | POA: Insufficient documentation

## 2014-10-05 DIAGNOSIS — R011 Cardiac murmur, unspecified: Secondary | ICD-10-CM | POA: Insufficient documentation

## 2014-10-05 LAB — COMPREHENSIVE METABOLIC PANEL
ALT: 12 U/L — ABNORMAL LOW (ref 17–63)
ANION GAP: 7 (ref 5–15)
AST: 25 U/L (ref 15–41)
Albumin: 4.5 g/dL (ref 3.5–5.0)
Alkaline Phosphatase: 75 U/L (ref 38–126)
BILIRUBIN TOTAL: 2.8 mg/dL — AB (ref 0.3–1.2)
BUN: 6 mg/dL (ref 6–20)
CO2: 26 mmol/L (ref 22–32)
Calcium: 9.6 mg/dL (ref 8.9–10.3)
Chloride: 107 mmol/L (ref 101–111)
Creatinine, Ser: 0.52 mg/dL — ABNORMAL LOW (ref 0.61–1.24)
Glucose, Bld: 94 mg/dL (ref 65–99)
POTASSIUM: 3.9 mmol/L (ref 3.5–5.1)
Sodium: 140 mmol/L (ref 135–145)
TOTAL PROTEIN: 8.2 g/dL — AB (ref 6.5–8.1)

## 2014-10-05 LAB — CBC WITH DIFFERENTIAL/PLATELET
BASOS PCT: 1 % (ref 0–1)
Basophils Absolute: 0.1 10*3/uL (ref 0.0–0.1)
Basophils Absolute: 0.2 10*3/uL — ABNORMAL HIGH (ref 0.0–0.1)
Basophils Relative: 1 % (ref 0–1)
EOS ABS: 0.8 10*3/uL — AB (ref 0.0–0.7)
Eosinophils Absolute: 0.4 10*3/uL (ref 0.0–0.7)
Eosinophils Relative: 3 % (ref 0–5)
Eosinophils Relative: 5 % (ref 0–5)
HCT: 25.1 % — ABNORMAL LOW (ref 39.0–52.0)
HCT: 24.9 % — ABNORMAL LOW (ref 39.0–52.0)
HEMOGLOBIN: 8.4 g/dL — AB (ref 13.0–17.0)
HEMOGLOBIN: 8.5 g/dL — AB (ref 13.0–17.0)
LYMPHS PCT: 33 % (ref 12–46)
Lymphocytes Relative: 21 % (ref 12–46)
Lymphs Abs: 3 10*3/uL (ref 0.7–4.0)
Lymphs Abs: 5.1 10*3/uL — ABNORMAL HIGH (ref 0.7–4.0)
MCH: 32.2 pg (ref 26.0–34.0)
MCH: 32.1 pg (ref 26.0–34.0)
MCHC: 33.5 g/dL (ref 30.0–36.0)
MCHC: 34.1 g/dL (ref 30.0–36.0)
MCV: 96.2 fL (ref 78.0–100.0)
MCV: 94 fL (ref 78.0–100.0)
MONOS PCT: 12 % (ref 3–12)
MPV: 10.3 fL (ref 8.6–12.4)
Monocytes Absolute: 1.3 10*3/uL — ABNORMAL HIGH (ref 0.1–1.0)
Monocytes Absolute: 1.9 10*3/uL — ABNORMAL HIGH (ref 0.1–1.0)
Monocytes Relative: 9 % (ref 3–12)
NEUTROS ABS: 9.4 10*3/uL — AB (ref 1.7–7.7)
NEUTROS ABS: 7.5 10*3/uL (ref 1.7–7.7)
NEUTROS PCT: 66 % (ref 43–77)
Neutrophils Relative %: 49 % (ref 43–77)
Platelets: 402 10*3/uL — ABNORMAL HIGH (ref 150–400)
Platelets: 421 10*3/uL — ABNORMAL HIGH (ref 150–400)
RBC: 2.61 MIL/uL — AB (ref 4.22–5.81)
RBC: 2.65 MIL/uL — AB (ref 4.22–5.81)
RDW: 19.7 % — ABNORMAL HIGH (ref 11.5–15.5)
RDW: 19.1 % — AB (ref 11.5–15.5)
WBC: 14.3 10*3/uL — AB (ref 4.0–10.5)
WBC: 15.5 10*3/uL — AB (ref 4.0–10.5)
nRBC: 2 /100 WBC — ABNORMAL HIGH

## 2014-10-05 LAB — TSH: TSH: 0.626 u[IU]/mL (ref 0.350–4.500)

## 2014-10-05 MED ORDER — FOLIC ACID 1 MG PO TABS: 1.0000 mg | ORAL_TABLET | Freq: Every day | ORAL | Status: DC

## 2014-10-05 MED ORDER — MORPHINE SULFATE ER 15 MG PO TBCR: 15.0000 mg | EXTENDED_RELEASE_TABLET | Freq: Two times a day (BID) | ORAL | Status: DC

## 2014-10-05 MED ORDER — OXYCODONE HCL 10 MG PO TABS: 10.0000 mg | ORAL_TABLET | Freq: Four times a day (QID) | ORAL | Status: DC | PRN

## 2014-10-05 NOTE — Progress Notes (Signed)
Patient ID: Matthew Shannon, male   DOB: 12-Sep-1990, 24 y.o.   MRN: [id number]   Matthew Shannon, is a 24 y.o. male  WUJ:[id number]  [id number]  DOB - 1990-05-30  CC:  Chief Complaint  Patient presents with  . Establish Care    back pain        HPI: Matthew Shannon is a 24 y.o. male here to establish care for sickle cell Disease. He denies other chronic illnesses. He was treated in hospital in the last week for Sickle Cell Crisis and a week prior to that for CAP. He has recently moved here from [facility]. He has been on hydrea but is unsure of the dosage and he is out. He is about out of folic acid and oxycodone 10 mg. He is asking for an increase in dosage on his oxycodone. He has not been on a long acting opoid. He reports he has a crisis a couple of times a year.  No Known Allergies Past Medical History  Diagnosis Date  . Sickle cell anemia    Current Outpatient Prescriptions on File Prior to Visit  Medication Sig Dispense Refill  . levofloxacin (LEVAQUIN) 750 MG tablet Take 1 tablet (750 mg total) by mouth daily. (Patient not taking: Reported on 10/02/2014) 5 tablet 0   No current facility-administered medications on file prior to visit.   Family History  Problem Relation Age of Onset  . Sickle cell anemia Brother   . Asthma Brother   . Diabetes Father    Social History   Social History  . Marital Status: Single    Spouse Name: N/A  . Number of Children: N/A  . Years of Education: N/A   Occupational History  . Not on file.   Social History Main Topics  . Smoking status: Current Every Day Smoker -- 0.25 packs/day    Types: Cigarettes  . Smokeless tobacco: Never Used  . Alcohol Use: No  . Drug Use: No  . Sexual Activity: Not on file   Other Topics Concern  . Not on file   Social History Narrative    Review of Systems: Constitutional: Denies chills, fever, weight loss HENT: Denies Problems Eyes: Denies problems Neck: Denies problems Respiratory: Negative for cough,  shortness of breath,   Cardiovascular: Negative for chest pain, palpitations and leg swelling. Gastrointestinal: Negative for abdominal distention, abdominal pain, nausea, vomiting, diarrhea,  Genitourinary: Denies problems Musculoskeletal: Denies problems except for sickle cell pain  Neurological: Denies problems Hematological: Denies problems Psychiatric/Behavioral: Denies depression, anxiety.   Objective:   Filed Vitals:   10/05/14 0955  BP: 110/58  Pulse: 84  Temp: 98.2 F (36.8 C)  Resp: 16    Physical Exam: Constitutional: Patient appears well-developed and well-nourished. No distress. HENT: Normocephalic, atraumatic, External right and left ear normal. Oropharynx is clear and moist.  Eyes: Conjunctivae and EOM are normal. PERRLA, no scleral icterus. Neck: Normal ROM. Neck supple. No lymphadenopathy, No thyromegaly. CVS: RRR, S1/S2 +, no murmurs, no gallops, no rubs Pulmonary: Effort and breath sounds normal, no stridor, rhonchi, wheezes, rales.  Abdominal: Soft. Normoactive BS,, no distension, tenderness, rebound or guarding.  Musculoskeletal: Normal range of motion. No edema and no tenderness.  Neuro: Alert.Normal muscle tone coordination. Non-focal Skin: Skin is warm and dry. No rash noted. Not diaphoretic. No erythema. No pallor. Psychiatric: Normal mood and affect. Behavior, judgment, thought content normal.  Lab Results  Component Value Date   WBC 17.5* 10/02/2014   HGB 6.9* 10/02/2014   HCT 20.1*  10/02/2014   MCV 94.8 10/02/2014   PLT 350 10/02/2014   Lab Results  Component Value Date   CREATININE 0.38* 10/02/2014   BUN <5* 10/02/2014   NA 140 10/02/2014   K 3.9 10/02/2014   CL 106 10/02/2014   CO2 28 10/02/2014    No results found for: HGBA1C Lipid Panel  No results found for: CHOL, TRIG, HDL, CHOLHDL, VLDL, LDLCALC     Assessment and plan:   1. Hb-SS disease without crisis  - CBC with Differential - Microalbumin, urine -He is to determine  hydrea dosage and call me with that.  2. Healthcare maintenance  - TSH - Vitamin D 1,25 dihydroxy - HIV antibody (with reflex)   Return in about 2 weeks (around 10/19/2014).  The patient was given clear instructions to go to ER or return to medical center if symptoms don't improve, worsen or new problems develop. The patient verbalized understanding. The patient was told to call to get lab results if they haven't heard anything in the next week.       Henrietta Hoover, MSN, FNP-BC   10/05/2014, 12:56 PM

## 2014-10-05 NOTE — ED Notes (Signed)
Pt not in lobby when called

## 2014-10-05 NOTE — ED Notes (Signed)
Patient has returned back to lobby. Pt was made aware that he must remain in lobby if he would like to be seen. Pt was made aware that he was called multiple times and now answer when called. EKG still pending due to patient unavailbility.

## 2014-10-06 ENCOUNTER — Emergency Department (HOSPITAL_COMMUNITY): Payer: Medicaid - Out of State

## 2014-10-06 LAB — MICROALBUMIN, URINE: MICROALB UR: 1 mg/dL (ref <2.0)

## 2014-10-06 LAB — RETICULOCYTES
RBC.: 2.63 MIL/uL — AB (ref 4.22–5.81)
RETIC CT PCT: 12.8 % — AB (ref 0.4–3.1)
Retic Count, Absolute: 336.6 10*3/uL — ABNORMAL HIGH (ref 19.0–186.0)

## 2014-10-06 LAB — HIV ANTIBODY (ROUTINE TESTING W REFLEX): HIV: NONREACTIVE

## 2014-10-06 LAB — I-STAT TROPONIN, ED: TROPONIN I, POC: 0 ng/mL (ref 0.00–0.08)

## 2014-10-06 MED ORDER — HYDROMORPHONE HCL 1 MG/ML IJ SOLN
1.0000 mg | Freq: Once | INTRAMUSCULAR | Status: AC
  Administered 2014-10-06: 1 mg via INTRAVENOUS
  Filled 2014-10-06: qty 1

## 2014-10-06 MED ORDER — KETOROLAC TROMETHAMINE 30 MG/ML IJ SOLN
30.0000 mg | Freq: Once | INTRAMUSCULAR | Status: AC
  Administered 2014-10-06: 30 mg via INTRAVENOUS
  Filled 2014-10-06: qty 1

## 2014-10-06 MED ORDER — DIPHENHYDRAMINE HCL 25 MG PO CAPS
50.0000 mg | ORAL_CAPSULE | Freq: Once | ORAL | Status: AC
  Administered 2014-10-06: 50 mg via ORAL
  Filled 2014-10-06: qty 2

## 2014-10-06 MED ORDER — ONDANSETRON HCL 4 MG/2ML IJ SOLN
4.0000 mg | INTRAMUSCULAR | Status: AC
  Administered 2014-10-06: 4 mg via INTRAVENOUS
  Filled 2014-10-06: qty 2

## 2014-10-06 NOTE — Discharge Instructions (Signed)
Sickle Cell Anemia, Adult °Sickle cell anemia is a condition in which red blood cells have an abnormal "sickle" shape. This abnormal shape shortens the cells' life span, which results in a lower than normal concentration of red blood cells in the blood. The sickle shape also causes the cells to clump together and block free blood flow through the blood vessels. As a result, the tissues and organs of the body do not receive enough oxygen. Sickle cell anemia causes organ damage and pain and increases the risk of infection. °CAUSES  °Sickle cell anemia is a genetic disorder. Those who receive two copies of the gene have the condition, and those who receive one copy have the trait. °RISK FACTORS °The sickle cell gene is most common in people whose families originated in [place]. Other areas of the globe where sickle cell trait occurs include the Mediterranean, [place] and [place], the [place], and the [place].  °SIGNS AND SYMPTOMS °· Pain, especially in the extremities, back, chest, or abdomen (common). The pain may start suddenly or may develop following an illness, especially if there is dehydration. Pain can also occur due to overexertion or exposure to extreme temperature changes. °· Frequent severe bacterial infections, especially certain types of pneumonia and meningitis. °· Pain and swelling in the hands and feet. °· Decreased activity.   °· Loss of appetite.   °· Change in behavior. °· Headaches. °· Seizures. °· Shortness of breath or difficulty breathing. °· Vision changes. °· Skin ulcers. °Those with the trait may not have symptoms or they may have mild symptoms.  °DIAGNOSIS  °Sickle cell anemia is diagnosed with blood tests that demonstrate the genetic trait. It is often diagnosed during the newborn period, due to mandatory testing nationwide. A variety of blood tests, X-rays, CT scans, MRI scans, ultrasounds, and lung function tests may also be done to monitor the condition. °TREATMENT  °Sickle  cell anemia may be treated with: °· Medicines. You may be given pain medicines, antibiotic medicines (to treat and prevent infections) or medicines to increase the production of certain types of hemoglobin. °· Fluids. °· Oxygen. °· Blood transfusions. °HOME CARE INSTRUCTIONS  °· Drink enough fluid to keep your urine clear or pale yellow. Increase your fluid intake in hot weather and during exercise. °· Do not smoke. Smoking lowers oxygen levels in the blood.   °· Only take over-the-counter or prescription medicines for pain, fever, or discomfort as directed by your health care provider. °· Take antibiotics as directed by your health care provider. Make sure you finish them it even if you start to feel better.   °· Take supplements as directed by your health care provider.   °· Consider wearing a medical alert bracelet. This tells anyone caring for you in an emergency of your condition.   °· When traveling, keep your medical information, health care provider's names, and the medicines you take with you at all times.   °· If you develop a fever, do not take medicines to reduce the fever right away. This could cover up a problem that is developing. Notify your health care provider. °· Keep all follow-up appointments with your health care provider. Sickle cell anemia requires regular medical care. °SEEK MEDICAL CARE IF: ° You have a fever. °SEEK IMMEDIATE MEDICAL CARE IF:  °· You feel dizzy or faint.   °· You have new abdominal pain, especially on the left side near the stomach area.   °· You develop a persistent, often uncomfortable and painful penile erection (priapism). If this is not treated immediately it   will lead to impotence.   °· You have numbness your arms or legs or you have a hard time moving them.   °· You have a hard time with speech.   °· You have a fever or persistent symptoms for more than 2-3 days.   °· You have a fever and your symptoms suddenly get worse.   °· You have signs or symptoms of infection.  These include:   °¨ Chills.   °¨ Abnormal tiredness (lethargy).   °¨ Irritability.   °¨ Poor eating.   °¨ Vomiting.   °· You develop pain that is not helped with medicine.   °· You develop shortness of breath. °· You have pain in your chest.   °· You are coughing up pus-like or bloody sputum.   °· You develop a stiff neck. °· Your feet or hands swell or have pain. °· Your abdomen appears bloated. °· You develop joint pain. °MAKE SURE YOU: °· Understand these instructions. °Document Released: 05/06/2005 Document Revised: 06/12/2013 Document Reviewed: 09/07/2012 °[employer]® Patient Information ©2015 [employer]. This information is not intended to replace advice given to you by your health care provider. Make sure you discuss any questions you have with your health care provider. ° °
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
## 2014-10-07 LAB — VITAMIN D 1,25 DIHYDROXY
VITAMIN D 1, 25 (OH) TOTAL: 81 pg/mL — AB (ref 18–72)
Vitamin D3 1, 25 (OH)2: 81 pg/mL

## 2014-10-09 ENCOUNTER — Encounter (HOSPITAL_COMMUNITY): Payer: Self-pay

## 2014-10-09 ENCOUNTER — Inpatient Hospital Stay (HOSPITAL_COMMUNITY)
Admission: EM | Admit: 2014-10-09 | Discharge: 2014-10-11 | DRG: 812 | Disposition: A | Discharge status: Home / Self Care | Payer: Medicaid - Out of State | Attending: Internal Medicine | Admitting: Internal Medicine

## 2014-10-09 DIAGNOSIS — Z832 Family history of diseases of the blood and blood-forming organs and certain disorders involving the immune mechanism: Secondary | ICD-10-CM

## 2014-10-09 DIAGNOSIS — D649 Anemia, unspecified: Secondary | ICD-10-CM

## 2014-10-09 DIAGNOSIS — Z833 Family history of diabetes mellitus: Secondary | ICD-10-CM

## 2014-10-09 DIAGNOSIS — F17210 Nicotine dependence, cigarettes, uncomplicated: Secondary | ICD-10-CM | POA: Diagnosis present

## 2014-10-09 DIAGNOSIS — D638 Anemia in other chronic diseases classified elsewhere: Secondary | ICD-10-CM | POA: Diagnosis present

## 2014-10-09 DIAGNOSIS — Z825 Family history of asthma and other chronic lower respiratory diseases: Secondary | ICD-10-CM

## 2014-10-09 DIAGNOSIS — D5700 Hb-SS disease with crisis, unspecified: Secondary | ICD-10-CM | POA: Diagnosis not present

## 2014-10-09 DIAGNOSIS — Z720 Tobacco use: Secondary | ICD-10-CM | POA: Diagnosis not present

## 2014-10-09 LAB — CBC
HCT: 24 % — ABNORMAL LOW (ref 39.0–52.0)
Hemoglobin: 8.5 g/dL — ABNORMAL LOW (ref 13.0–17.0)
MCH: 32.2 pg (ref 26.0–34.0)
MCHC: 35.4 g/dL (ref 30.0–36.0)
MCV: 90.9 fL (ref 78.0–100.0)
PLATELETS: 406 10*3/uL — AB (ref 150–400)
RBC: 2.64 MIL/uL — AB (ref 4.22–5.81)
RDW: 18.7 % — AB (ref 11.5–15.5)
WBC: 13.8 10*3/uL — AB (ref 4.0–10.5)

## 2014-10-09 LAB — COMPREHENSIVE METABOLIC PANEL
ALT: 7 U/L — AB (ref 17–63)
AST: 26 U/L (ref 15–41)
Albumin: 4.4 g/dL (ref 3.5–5.0)
Alkaline Phosphatase: 85 U/L (ref 38–126)
Anion gap: 8 (ref 5–15)
BILIRUBIN TOTAL: 3.7 mg/dL — AB (ref 0.3–1.2)
CO2: 27 mmol/L (ref 22–32)
CREATININE: 0.42 mg/dL — AB (ref 0.61–1.24)
Calcium: 9.2 mg/dL (ref 8.9–10.3)
Chloride: 105 mmol/L (ref 101–111)
Glucose, Bld: 73 mg/dL (ref 65–99)
POTASSIUM: 4.1 mmol/L (ref 3.5–5.1)
Sodium: 140 mmol/L (ref 135–145)
TOTAL PROTEIN: 7.9 g/dL (ref 6.5–8.1)

## 2014-10-09 LAB — RETICULOCYTES
RBC.: 2.64 MIL/uL — AB (ref 4.22–5.81)
RETIC COUNT ABSOLUTE: 290.4 10*3/uL — AB (ref 19.0–186.0)
Retic Ct Pct: 11 % — ABNORMAL HIGH (ref 0.4–3.1)

## 2014-10-09 MED ORDER — HYDROMORPHONE HCL 2 MG/ML IJ SOLN
3.0000 mg | INTRAMUSCULAR | Status: DC | PRN
  Administered 2014-10-09 – 2014-10-10 (×5): 3 mg via INTRAVENOUS
  Filled 2014-10-09 (×5): qty 2

## 2014-10-09 MED ORDER — HYDROMORPHONE HCL 2 MG/ML IJ SOLN
2.0000 mg | Freq: Once | INTRAMUSCULAR | Status: AC
  Administered 2014-10-09: 2 mg via INTRAVENOUS
  Filled 2014-10-09: qty 1

## 2014-10-09 MED ORDER — ONDANSETRON HCL 4 MG/2ML IJ SOLN
4.0000 mg | Freq: Once | INTRAMUSCULAR | Status: AC
  Administered 2014-10-09: 4 mg via INTRAVENOUS
  Filled 2014-10-09: qty 2

## 2014-10-09 MED ORDER — SODIUM CHLORIDE 0.9 % IV SOLN
INTRAVENOUS | Status: DC
Start: 2014-10-09 — End: 2014-10-10
  Administered 2014-10-09: 22:00:00 via INTRAVENOUS

## 2014-10-09 MED ORDER — PROMETHAZINE HCL 25 MG PO TABS: 12.5000 mg | ORAL_TABLET | Freq: Four times a day (QID) | ORAL | Status: DC | PRN

## 2014-10-09 MED ORDER — ACETAMINOPHEN 650 MG RE SUPP: 650.0000 mg | Freq: Four times a day (QID) | RECTAL | Status: DC | PRN

## 2014-10-09 MED ORDER — SODIUM CHLORIDE 0.9 % IJ SOLN
3.0000 mL | Freq: Two times a day (BID) | INTRAMUSCULAR | Status: DC
  Administered 2014-10-10 (×3): 3 mL via INTRAVENOUS

## 2014-10-09 MED ORDER — DIPHENHYDRAMINE HCL 50 MG/ML IJ SOLN
25.0000 mg | Freq: Once | INTRAMUSCULAR | Status: AC
  Administered 2014-10-09: 25 mg via INTRAVENOUS
  Filled 2014-10-09: qty 1

## 2014-10-09 MED ORDER — PROMETHAZINE HCL 25 MG RE SUPP
12.5000 mg | Freq: Four times a day (QID) | RECTAL | Status: DC | PRN
Start: 2014-10-09 — End: 2014-10-10

## 2014-10-09 MED ORDER — LEVOFLOXACIN 500 MG PO TABS: 500.0000 mg | ORAL_TABLET | Freq: Every day | ORAL | Status: DC

## 2014-10-09 MED ORDER — ACETAMINOPHEN 325 MG PO TABS: 650.0000 mg | ORAL_TABLET | Freq: Four times a day (QID) | ORAL | Status: DC | PRN

## 2014-10-09 MED ORDER — PROMETHAZINE HCL 25 MG/ML IJ SOLN
12.5000 mg | Freq: Four times a day (QID) | INTRAMUSCULAR | Status: DC | PRN
  Administered 2014-10-10: 12.5 mg via INTRAVENOUS
  Filled 2014-10-09: qty 1

## 2014-10-09 MED ORDER — DIPHENHYDRAMINE HCL 50 MG/ML IJ SOLN
25.0000 mg | INTRAMUSCULAR | Status: DC | PRN
  Administered 2014-10-09 – 2014-10-10 (×3): 25 mg via INTRAVENOUS
  Filled 2014-10-09 (×3): qty 1

## 2014-10-09 MED ORDER — LEVOFLOXACIN 500 MG PO TABS
500.0000 mg | ORAL_TABLET | Freq: Every day | ORAL | Status: DC
  Administered 2014-10-10: 500 mg via ORAL
  Filled 2014-10-09 (×2): qty 1

## 2014-10-09 MED ORDER — SODIUM CHLORIDE 0.9 % IV BOLUS (SEPSIS)
1000.0000 mL | INTRAVENOUS | Status: AC
  Administered 2014-10-09: 1000 mL via INTRAVENOUS

## 2014-10-09 NOTE — ED Notes (Signed)
Per pt, sickle cell pain x 4 days.  Seen here with no relief.  Went home. Pain is generalized.  Also c/o nausea

## 2014-10-09 NOTE — Progress Notes (Signed)
Patient listed as not having a pcp and Medicaid out of state.  EDCM spoke to patient at bedside.  Patient reports that he [facility]hs [facility]  Patient reports he has not gotten his Medicaid changed from [place] to [place].  EDCM provided patient with phone number and address of DSS of [place] as he staying with his uncle and plans to move here.  EDCM instructed patient to call DSS ASAP.  Patient thankful for services.  No further EDCM needs at this time.
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
## 2014-10-10 DIAGNOSIS — D72829 Elevated white blood cell count, unspecified: Secondary | ICD-10-CM | POA: Diagnosis not present

## 2014-10-10 DIAGNOSIS — Z825 Family history of asthma and other chronic lower respiratory diseases: Secondary | ICD-10-CM | POA: Diagnosis not present

## 2014-10-10 DIAGNOSIS — F17210 Nicotine dependence, cigarettes, uncomplicated: Secondary | ICD-10-CM | POA: Diagnosis present

## 2014-10-10 DIAGNOSIS — D638 Anemia in other chronic diseases classified elsewhere: Secondary | ICD-10-CM

## 2014-10-10 DIAGNOSIS — Z832 Family history of diseases of the blood and blood-forming organs and certain disorders involving the immune mechanism: Secondary | ICD-10-CM | POA: Diagnosis not present

## 2014-10-10 DIAGNOSIS — M79605 Pain in left leg: Secondary | ICD-10-CM | POA: Diagnosis not present

## 2014-10-10 DIAGNOSIS — D5700 Hb-SS disease with crisis, unspecified: Principal | ICD-10-CM

## 2014-10-10 DIAGNOSIS — Z833 Family history of diabetes mellitus: Secondary | ICD-10-CM | POA: Diagnosis not present

## 2014-10-10 DIAGNOSIS — Z720 Tobacco use: Secondary | ICD-10-CM | POA: Diagnosis not present

## 2014-10-10 LAB — COMPREHENSIVE METABOLIC PANEL
ALBUMIN: 3.9 g/dL (ref 3.5–5.0)
ALK PHOS: 77 U/L (ref 38–126)
ALT: 9 U/L — ABNORMAL LOW (ref 17–63)
ANION GAP: 5 (ref 5–15)
AST: 21 U/L (ref 15–41)
BILIRUBIN TOTAL: 4.5 mg/dL — AB (ref 0.3–1.2)
BUN: 5 mg/dL — AB (ref 6–20)
CALCIUM: 8.5 mg/dL — AB (ref 8.9–10.3)
CO2: 27 mmol/L (ref 22–32)
Chloride: 107 mmol/L (ref 101–111)
Creatinine, Ser: 0.44 mg/dL — ABNORMAL LOW (ref 0.61–1.24)
GFR calc Af Amer: >60 mL/min (ref >60)
GLUCOSE: 85 mg/dL (ref 65–99)
POTASSIUM: 3.8 mmol/L (ref 3.5–5.1)
Sodium: 139 mmol/L (ref 135–145)
TOTAL PROTEIN: 6.8 g/dL (ref 6.5–8.1)

## 2014-10-10 LAB — CBC
HEMATOCRIT: 21.4 % — AB (ref 39.0–52.0)
HEMOGLOBIN: 7.5 g/dL — AB (ref 13.0–17.0)
MCH: 31.9 pg (ref 26.0–34.0)
MCHC: 35 g/dL (ref 30.0–36.0)
MCV: 91.1 fL (ref 78.0–100.0)
Platelets: 332 10*3/uL (ref 150–400)
RBC: 2.35 MIL/uL — ABNORMAL LOW (ref 4.22–5.81)
RDW: 19.2 % — AB (ref 11.5–15.5)
WBC: 13.9 10*3/uL — ABNORMAL HIGH (ref 4.0–10.5)

## 2014-10-10 MED ORDER — HYDROMORPHONE 2 MG/ML HIGH CONCENTRATION IV PCA SOLN
INTRAVENOUS | Status: DC
  Administered 2014-10-10: 4.8 mg via INTRAVENOUS
  Administered 2014-10-10: 7.8 mg via INTRAVENOUS
  Administered 2014-10-10: 13:00:00 via INTRAVENOUS
  Administered 2014-10-11 (×2): 2.4 mg via INTRAVENOUS
  Administered 2014-10-11: 4.2 mg via INTRAVENOUS
  Administered 2014-10-11: 3 mg via INTRAVENOUS
  Filled 2014-10-10 (×2): qty 25

## 2014-10-10 MED ORDER — NALOXONE HCL 0.4 MG/ML IJ SOLN: 0.4000 mg | INTRAMUSCULAR | Status: DC | PRN

## 2014-10-10 MED ORDER — HYDROMORPHONE HCL 2 MG/ML IJ SOLN
2.0000 mg | Freq: Once | INTRAMUSCULAR | Status: AC
  Administered 2014-10-10: 2 mg via INTRAVENOUS
  Filled 2014-10-10: qty 1

## 2014-10-10 MED ORDER — DIPHENHYDRAMINE HCL 25 MG PO CAPS: 25.0000 mg | ORAL_CAPSULE | ORAL | Status: DC | PRN

## 2014-10-10 MED ORDER — ENOXAPARIN SODIUM 40 MG/0.4ML ~~LOC~~ SOLN
40.0000 mg | SUBCUTANEOUS | Status: DC
  Filled 2014-10-10 (×3): qty 0.4

## 2014-10-10 MED ORDER — ONDANSETRON HCL 4 MG/2ML IJ SOLN: 4.0000 mg | Freq: Four times a day (QID) | INTRAMUSCULAR | Status: DC | PRN

## 2014-10-10 MED ORDER — KETOROLAC TROMETHAMINE 30 MG/ML IJ SOLN
30.0000 mg | Freq: Four times a day (QID) | INTRAMUSCULAR | Status: DC
  Administered 2014-10-10 – 2014-10-11 (×4): 30 mg via INTRAVENOUS
  Filled 2014-10-10 (×4): qty 1

## 2014-10-10 MED ORDER — POLYETHYLENE GLYCOL 3350 17 G PO PACK
17.0000 g | PACK | Freq: Every day | ORAL | Status: DC
  Administered 2014-10-10 – 2014-10-11 (×2): 17 g via ORAL
  Filled 2014-10-10 (×2): qty 1

## 2014-10-10 MED ORDER — DEXTROSE-NACL 5-0.45 % IV SOLN
INTRAVENOUS | Status: DC
  Administered 2014-10-10 – 2014-10-11 (×2): via INTRAVENOUS

## 2014-10-10 MED ORDER — SENNOSIDES-DOCUSATE SODIUM 8.6-50 MG PO TABS
1.0000 | ORAL_TABLET | Freq: Two times a day (BID) | ORAL | Status: DC
  Administered 2014-10-10 – 2014-10-11 (×3): 1 via ORAL
  Filled 2014-10-10 (×3): qty 1

## 2014-10-10 MED ORDER — SODIUM CHLORIDE 0.9 % IJ SOLN: 9.0000 mL | INTRAMUSCULAR | Status: DC | PRN

## 2014-10-10 MED ORDER — SODIUM CHLORIDE 0.9 % IV SOLN: 25.0000 mg | INTRAVENOUS | Status: DC | PRN

## 2014-10-10 NOTE — Progress Notes (Signed)
SICKLE CELL SERVICE PROGRESS NOTE  Matthew Shannon NGE:[id number] DOB: September 20, 1990 DOA: 10/09/2014 PCP: No primary care provider on file.  Assessment/Plan: Principal Problem:   Sickle cell pain crisis Active Problems:   Tobacco abuse   Anemia   Sickle cell crisis  1. Hb SS with crisis: Pt reports pain localized to back. He is opiate tolerant and is receiving intermittent clinician assisted doses which per patient is ineffective. He reports that his pain is currently 9/10. I will add Toradol and start on a weight based Dilaudid PCA. Change IVF to D5.45. Will reassess later today.  2. Leukocytosis: Pt has a mild leukocytosis and no evidence of infection. He was started on Levaquin by the admitting Physician however with no clinical signs or symptoms of an infection and only a minimally elevated WBC which is a feature of crisis, I will discontinue Levaquin. 3. Anemia of chronic disease: Pt does not know baseline Hb but has no S/S of decompensated acute anemia. Will monitor.   Code Status: Full Code Family Communication: N/A Disposition Plan: Not yet ready for discharge  Dulcy Sida A.  Pager [phone]. If 7PM-7AM, please contact night-coverage.  10/10/2014, 10:00 AM    Pertinent History: Pt states that he was seen by [facility] and she could not adjust his pain medications until he was seen by [name]. He reports that he feels that his pain medication is ineffective and thus his reason for presenting to the ED. He has an appointment with D.r Hyman Hopes on 10/16/2014.  Consultants:  None  Procedures:  None  Antibiotics:  Levaquin 8/31 >> 8/31  HPI/Subjective: Pt reports pain localized to his back at an intensity of 8-9/10. He describes pain as throbbing and occasionally sharp. He has no other complaints at this time. Last BM yesterday. Telemetry shows 1st degree AVB with sinus rhythm.  Objective: Filed Vitals:   10/09/14 1431 10/09/14 1758 10/09/14 2053  BP:  107/60 104/55 116/65  Pulse: 84 79 67  Temp: 97.9 F (36.6 C) 98.9 F (37.2 C) 97.7 F (36.5 C)  TempSrc: Oral Oral Oral  Resp: Height:    (1.778 m)  Weight:   130 lb 1.1 oz (59 kg)  SpO2: 100% 96% 100%   Weight change:  No intake or output data in the 24 hours ending 10/10/14 1000  General: Alert, awake, oriented x3, in no acute distress.  HEENT: [place]/AT PEERL, EOMI, mild icterus Neck: Trachea midline,  no masses, no thyromegal,y no JVD, no carotid bruit OROPHARYNX:  Moist, No exudate/ erythema/lesions.  Heart: Regular rate and rhythm, without murmurs, rubs, gallops, PMI non-displaced, no heaves or thrills on palpation.  Lungs: Clear to auscultation, no wheezing or rhonchi noted. No increased vocal fremitus resonant to percussion  Abdomen: Soft, nontender, nondistended, positive bowel sounds, no masses no hepatosplenomegaly noted.  Neuro: No focal neurological deficits noted cranial nerves II through XII grossly intact.  Strength at functional baseline in bilateral upper and lower extremities. Musculoskeletal: No warm swelling or erythema around joints, no spinal tenderness noted. Psychiatric: Patient alert and oriented x3, good insight and cognition, good recent to remote recall.    Data Reviewed: Basic Metabolic Panel:  Recent Labs Lab 10/05/14 2113 10/09/14 1621 10/10/14 0556  NA 140 140 139  K 3.9 4.1 3.8  CL 107 105 107  CO2 GLUCOSE 94 73 85  BUN 6 <5* 5*  CREATININE 0.52* 0.42* 0.44*  CALCIUM 9.6 9.2 8.5*  Liver Function Tests:  Recent Labs Lab 10/05/14 2113 10/09/14 1621 10/10/14 0556  AST 25 26 21   ALT 12* 7* 9*  ALKPHOS 75 85 77  BILITOT 2.8* 3.7* 4.5*  PROT 8.2* 7.9 6.8  ALBUMIN 4.5 4.4 3.9   No results for input(s): LIPASE, AMYLASE in the last 168 hours. No results for input(s): AMMONIA in the last 168 hours. CBC:  Recent Labs Lab 10/05/14 1059 10/05/14 2113 10/09/14 1621 10/10/14 0556  WBC 14.3* 15.5* 13.8*  13.9*  NEUTROABS 9.4* 7.5  --   --   HGB 8.4* 8.5* 8.5* 7.5*  HCT 25.1* 24.9* 24.0* 21.4*  MCV 96.2 94.0 90.9 91.1  PLT 402* 421* 406* 332   Cardiac Enzymes: No results for input(s): CKTOTAL, CKMB, CKMBINDEX, TROPONINI in the last 168 hours. BNP (last 3 results) No results for input(s): BNP in the last 8760 hours.  ProBNP (last 3 results) No results for input(s): PROBNP in the last 8760 hours.  CBG: No results for input(s): GLUCAP in the last 168 hours.  No results found for this or any previous visit (from the past 240 hour(s)).   Studies: Dg Chest 2 View  10/06/2014   CLINICAL DATA:  Right-sided chest pain for 3 days. Current everyday smoker.  EXAM: CHEST  2 VIEW  COMPARISON:  09/25/2014  FINDINGS: Normal heart size and pulmonary vascularity. Linear interstitial changes in the lung bases are stable since prior study. This may represent fibrosis or chronic bronchitis. There is new patchy airspace infiltration in the left upper lung, suggesting developing acute pneumonia. No blunting of costophrenic angles. No pneumothorax. Thoracic scoliosis convex towards the right.  IMPRESSION: Unchanged interstitial pattern to the lungs. New focal airspace disease in the left upper lung suggesting acute pneumonia.   Electronically Signed   By: Burman Nieves M.D.   On: 10/06/2014 01:30   Dg Chest 2 View  09/24/2014   CLINICAL DATA:  Back pain for 3 days. Nonproductive cough. History of sickle cell disease.  EXAM: CHEST  2 VIEW  COMPARISON:  09/23/2014  FINDINGS: Heart is mildly enlarged. Airspace opacity in the right lung base is similar to prior study. This could be chronic, but cannot exclude developing infiltrate. Left lung is clear. No effusions. No acute bony abnormality.  IMPRESSION: Stable opacity at the right lung base of unknown chronicity. This could reflect scarring or developing infiltrate.   Electronically Signed   By: Charlett Nose M.D.   On: 09/24/2014 16:23   Dg Chest 2  View  09/23/2014   CLINICAL DATA:  Wheezing and congestion for 2 days  EXAM: CHEST - 2 VIEW  COMPARISON:  None.  FINDINGS: Cardiac shadow is within normal limits. A mild scoliosis concave to the left is noted in the mid thoracic spine. The lungs are well aerated bilaterally. Slight increased density is noted in the right lung base projecting in the lower lobe consistent with early infiltrate. No sizable effusion is seen. No other bony abnormality is noted.  IMPRESSION: Early infiltrate in the right lower lobe.   Electronically Signed   By: Alcide Clever M.D.   On: 09/23/2014 21:42   Dg Chest Port 1 View  09/25/2014   CLINICAL DATA:  Sickle cell anemia with current hypoxic episode, patient smokes  EXAM: PORTABLE CHEST - 1 VIEW  COMPARISON:  09/24/2014  FINDINGS: Sigmoid scoliotic curvature of the thoracolumbar spine stable. Mild to moderate cardiac enlargement stable. Moderate vascular congestion. Mild diffuse interstitial process bilaterally, increased in conspicuity when compared  to prior study. No pleural effusions.  IMPRESSION: Cardiac enlargement with vascular congestion stable from prior study. Mildly increased diffuse interstitial opacification. No Kerley B-lines suggesting facial pneumonia however. Findings may be due to sickle cell crisis. Other possibilities include atypical pneumonia or progressive interstitial lung disease.   Electronically Signed   By: Esperanza Heir M.D.   On: 09/25/2014 21:47    Scheduled Meds: . enoxaparin (LOVENOX) injection  40 mg Subcutaneous Q24H  . HYDROmorphone PCA 2 mg/mL   Intravenous 6 times per day  .  HYDROmorphone (DILAUDID) injection  2 mg Intravenous Once  . ketorolac  30 mg Intravenous 4 times per day  . polyethylene glycol  17 g Oral Daily  . senna-docusate  1 tablet Oral BID  . sodium chloride  3 mL Intravenous Q12H   Continuous Infusions: . dextrose 5 % and 0.45% NaCl      Time spent 30 minutes.

## 2014-10-10 NOTE — Progress Notes (Signed)
Report called to 3W spoke to [facility]

## 2014-10-10 NOTE — Progress Notes (Signed)
Attempted to call report to 3W.Nurse unable to take at this time d/t off unit.

## 2014-10-10 NOTE — Care Management Note (Signed)
Case Management Note  Patient Details  Name: Matthew Shannon MRN: [id number] Date of Birth: February 07, 1991  Subjective/Objective:   24 y/o m admitted w/SSC. From home.               Action/Plan:d/c plan home. No anticipated d/c needs.   Expected Discharge Date:   (unknown)               Expected Discharge Plan:  Home/Self Care  In-House Referral:     Discharge planning Services  CM Consult  Post Acute Care Choice:    Choice offered to:     DME Arranged:    DME Agency:     HH Arranged:    HH Agency:     Status of Service:  In process, will continue to follow  Medicare Important Message Given:    Date Medicare IM Given:    Medicare IM give by:    Date Additional Medicare IM Given:    Additional Medicare Important Message give by:     If discussed at Long Length of Stay Meetings, dates discussed:    Additional Comments:  Lanier Clam, RN 10/10/2014, 10:57 AM

## 2014-10-11 DIAGNOSIS — D72829 Elevated white blood cell count, unspecified: Secondary | ICD-10-CM

## 2014-10-11 DIAGNOSIS — Z720 Tobacco use: Secondary | ICD-10-CM

## 2014-10-11 LAB — CBC WITH DIFFERENTIAL/PLATELET
BASOS PCT: 1 % (ref 0–1)
Basophils Absolute: 0.2 10*3/uL — ABNORMAL HIGH (ref 0.0–0.1)
EOS ABS: 1.1 10*3/uL — AB (ref 0.0–0.7)
EOS PCT: 6 % — AB (ref 0–5)
HCT: 22.8 % — ABNORMAL LOW (ref 39.0–52.0)
HEMOGLOBIN: 7.8 g/dL — AB (ref 13.0–17.0)
Lymphocytes Relative: 37 % (ref 12–46)
Lymphs Abs: 6.8 10*3/uL — ABNORMAL HIGH (ref 0.7–4.0)
MCH: 31.2 pg (ref 26.0–34.0)
MCHC: 34.2 g/dL (ref 30.0–36.0)
MCV: 91.2 fL (ref 78.0–100.0)
MONO ABS: 1.8 10*3/uL — AB (ref 0.1–1.0)
Monocytes Relative: 10 % (ref 3–12)
NEUTROS ABS: 8.5 10*3/uL — AB (ref 1.7–7.7)
Neutrophils Relative %: 46 % (ref 43–77)
PLATELETS: 361 10*3/uL (ref 150–400)
RBC: 2.5 MIL/uL — ABNORMAL LOW (ref 4.22–5.81)
RDW: 21.2 % — ABNORMAL HIGH (ref 11.5–15.5)
WBC: 18.4 10*3/uL — ABNORMAL HIGH (ref 4.0–10.5)

## 2014-10-11 LAB — LACTATE DEHYDROGENASE: LDH: 269 U/L — AB (ref 98–192)

## 2014-10-11 MED ORDER — HYDROMORPHONE HCL 4 MG PO TABS: 4.0000 mg | ORAL_TABLET | ORAL | Status: DC | PRN

## 2014-10-11 MED ORDER — HYDROMORPHONE HCL 2 MG PO TABS: 2.0000 mg | ORAL_TABLET | ORAL | Status: DC | PRN

## 2014-10-11 MED ORDER — HYDROMORPHONE HCL 2 MG PO TABS
2.0000 mg | ORAL_TABLET | ORAL | Status: DC
  Administered 2014-10-11: 2 mg via ORAL
  Filled 2014-10-11: qty 1

## 2014-10-11 NOTE — Progress Notes (Signed)
23 ml high dose dilaudid pca wasted in sink verified with Enriqueta Shutter.  Betsey Holiday, Karis Rilling Dionne

## 2014-10-11 NOTE — Progress Notes (Signed)
Wasted 23ml high dose dilaudid pca with misha mathis. Rubye Oaks

## 2014-10-11 NOTE — Discharge Summary (Signed)
Matthew Shannon MRN: [id number] DOB/AGE: Jun 20, 1990 24 y.o.  Admit date: 10/09/2014 Discharge date: 10/11/2014  Primary Care Physician:  Jeanann Lewandowsky, MD   Discharge Diagnoses:   Patient Active Problem List   Diagnosis Date Noted  . Anemia 10/09/2014  . Sickle cell crisis 10/09/2014  . Tachycardia 09/26/2014  . Sickle cell pain crisis 09/24/2014  . Community acquired pneumonia 09/24/2014  . Tobacco abuse 09/24/2014  . CAP (community acquired pneumonia) 09/24/2014    DISCHARGE MEDICATION:   Medication List    STOP taking these medications        levofloxacin 750 MG tablet  Commonly known as:  LEVAQUIN     oxyCODONE 15 MG immediate release tablet  Commonly known as:  ROXICODONE     Oxycodone HCl 10 MG Tabs      TAKE these medications        folic acid 1 MG tablet  Commonly known as:  FOLVITE  Take 1 tablet (1 mg total) by mouth daily.     HYDROmorphone 4 MG tablet  Commonly known as:  DILAUDID  Take 1 tablet (4 mg total) by mouth every 4 (four) hours as needed for severe pain.     morphine 15 MG 12 hr tablet  Commonly known as:  MS CONTIN  Take 1 tablet (15 mg total) by mouth every 12 (twelve) hours.          Consults:     SIGNIFICANT DIAGNOSTIC STUDIES:  Dg Chest 2 View  10/06/2014   CLINICAL DATA:  Right-sided chest pain for 3 days. Current everyday smoker.  EXAM: CHEST  2 VIEW  COMPARISON:  09/25/2014  FINDINGS: Normal heart size and pulmonary vascularity. Linear interstitial changes in the lung bases are stable since prior study. This may represent fibrosis or chronic bronchitis. There is new patchy airspace infiltration in the left upper lung, suggesting developing acute pneumonia. No blunting of costophrenic angles. No pneumothorax. Thoracic scoliosis convex towards the right.  IMPRESSION: Unchanged interstitial pattern to the lungs. New focal airspace disease in the left upper lung suggesting acute pneumonia.   Electronically Signed   By: Burman Nieves M.D.   On: 10/06/2014 01:30   Dg Chest 2 View  09/24/2014   CLINICAL DATA:  Back pain for 3 days. Nonproductive cough. History of sickle cell disease.  EXAM: CHEST  2 VIEW  COMPARISON:  09/23/2014  FINDINGS: Heart is mildly enlarged. Airspace opacity in the right lung base is similar to prior study. This could be chronic, but cannot exclude developing infiltrate. Left lung is clear. No effusions. No acute bony abnormality.  IMPRESSION: Stable opacity at the right lung base of unknown chronicity. This could reflect scarring or developing infiltrate.   Electronically Signed   By: Charlett Nose M.D.   On: 09/24/2014 16:23   Dg Chest 2 View  09/23/2014   CLINICAL DATA:  Wheezing and congestion for 2 days  EXAM: CHEST - 2 VIEW  COMPARISON:  None.  FINDINGS: Cardiac shadow is within normal limits. A mild scoliosis concave to the left is noted in the mid thoracic spine. The lungs are well aerated bilaterally. Slight increased density is noted in the right lung base projecting in the lower lobe consistent with early infiltrate. No sizable effusion is seen. No other bony abnormality is noted.  IMPRESSION: Early infiltrate in the right lower lobe.   Electronically Signed   By: Alcide Clever M.D.   On: 09/23/2014 21:42   Dg Chest [facility] 1 [address]  09/25/2014   CLINICAL DATA:  Sickle cell anemia with current hypoxic episode, patient smokes  EXAM: PORTABLE CHEST - 1 VIEW  COMPARISON:  09/24/2014  FINDINGS: Sigmoid scoliotic curvature of the thoracolumbar spine stable. Mild to moderate cardiac enlargement stable. Moderate vascular congestion. Mild diffuse interstitial process bilaterally, increased in conspicuity when compared to prior study. No pleural effusions.  IMPRESSION: Cardiac enlargement with vascular congestion stable from prior study. Mildly increased diffuse interstitial opacification. No Kerley B-lines suggesting facial pneumonia however. Findings may be due to sickle cell crisis. Other possibilities  include atypical pneumonia or progressive interstitial lung disease.   Electronically Signed   By: Esperanza Heir M.D.   On: 09/25/2014 21:47      No results found for this or any previous visit (from the past 240 hour(s)).  BRIEF ADMITTING H & P: 24 yo male with hx of sickle cell, c/o pain in bilateral legs, back for the past 4 days c/w his prior sickle cell pain per pt. Routine pain medication has not been effective. Pt states that he was on different medication while in [facility] which was more efficiacious. Pt states that this was changed by the sickle cell clinic to ? Something weaker per the patient.    Hospital Course:  Present on Admission:  . Sickle cell pain crisis: Pt was admitted with simple sickle cell crisis. He was initially treated with bolus doses of Dilaudid every 3 ours by admitting Physician which was ineffective. He was then transition to IV Dilaudid via PCA, IVF and Toradol.  His pain decreased and he was transitioned to Oral Dilaudid 2 mg every 3 hours. Pt was still having pain at the level where he required 8.5 mg of IV Dilaudid within 4 hours. However in the absence of medical risk, he insisted on being discharged and felt that he had to balance his pain management  with his responsibilities to his 2 sons. Thus he was discharged with a Prescription for Dilaudid 4 mg #30 tabs.   . Leukocytosis: No evidence of infection. Felt to be secondary to crisis.  . Tobacco abuse: Pt counseled against further tobacco use.   Disposition and Follow-up:  Pt discharged in good condition. He is to follow up with [name] on 10/16/2014.       Discharge Instructions    Activity as tolerated - No restrictions    Complete by:  As directed      Diet general    Complete by:  As directed            DISCHARGE EXAM:  General: Alert, awake, oriented x3, in mild distress.  Vital Signs: BP 107/61, HR 77, temperature 97.9 F (36.6 C), temperature source Oral, RR 11, height 5\' 10"  (1.778  m), weight 126 lb 1.6 oz (57.199 kg), SpO2 96 %. HEENT: [place]/AT PEERL, EOMI, anicteric Neck: Trachea midline, no masses, no thyromegal,y no JVD, no carotid bruit OROPHARYNX: Moist, No exudate/ erythema/lesions.  Heart: Regular rate and rhythm, without murmurs, rubs, gallops or S3. PMI non-displaced. Exam reveals no decreased pulses. Pulmonary/Chest: Normal effort. Breath sounds normal. No. Apnea. Clear to auscultation,no stridor,  no wheezing and no rhonchi noted. No respiratory distress and no tenderness noted. Abdomen: Soft, nontender, nondistended, normal bowel sounds, no masses no hepatosplenomegaly noted. No fluid wave and no ascites. There is no guarding or rebound. Neuro: Alert and oriented to person, place and time. Normal motor skills, Displays no atrophy or tremors and exhibits normal muscle tone.  No focal neurological deficits  noted cranial nerves II through XII grossly intact. No sensory deficit noted.  Strength at functional baseline in bilateral upper and lower extremities. Gait normal. Musculoskeletal: No warm swelling or erythema around joints, no spinal tenderness noted. Psychiatric: Patient alert and oriented x3, good insight and cognition, good recent to remote recall. Mood, memory, affect and judgement normal Skin: Skin is warm and dry. No bruising, no ecchymosis and no rash noted. Pt is not diaphoretic. No erythema. No pallor    Recent Labs  10/09/14 1621 10/10/14 0556  NA 140 139  K 4.1 3.8  CL 105 107  CO2 27 27  GLUCOSE 73 85  BUN <5* 5*  CREATININE 0.42* 0.44*  CALCIUM 9.2 8.5*    Recent Labs  10/09/14 1621 10/10/14 0556  AST 26 21  ALT 7* 9*  ALKPHOS 85 77  BILITOT 3.7* 4.5*  PROT 7.9 6.8  ALBUMIN 4.4 3.9   No results for input(s): LIPASE, AMYLASE in the last 72 hours.  Recent Labs  10/10/14 0556 10/11/14 0440  WBC 13.9* 18.4*  NEUTROABS  --  8.5*  HGB 7.5* 7.8*  HCT 21.4* 22.8*  MCV 91.1 91.2  PLT 332 361     Total time spent  including face to face and decision making was greater than 30 minutes  Signed: Ajahni Nay A. 10/11/2014, 4:24 PM

## 2014-10-16 ENCOUNTER — Encounter: Payer: Self-pay | Admitting: Internal Medicine

## 2014-10-16 ENCOUNTER — Ambulatory Visit (INDEPENDENT_AMBULATORY_CARE_PROVIDER_SITE_OTHER): Payer: Medicaid - Out of State | Admitting: Internal Medicine

## 2014-10-16 VITALS — BP 109/58 | HR 94 | Temp 98.3°F | Resp 16 | Ht 70.0 in | Wt 128.0 lb

## 2014-10-16 DIAGNOSIS — D571 Sickle-cell disease without crisis: Secondary | ICD-10-CM | POA: Diagnosis not present

## 2014-10-16 DIAGNOSIS — Z0000 Encounter for general adult medical examination without abnormal findings: Secondary | ICD-10-CM

## 2014-10-16 MED ORDER — OXYCODONE-ACETAMINOPHEN 10-325 MG PO TABS: 1.0000 | ORAL_TABLET | ORAL | Status: DC | PRN

## 2014-10-16 MED ORDER — HYDROXYUREA 500 MG PO CAPS: 500.0000 mg | ORAL_CAPSULE | Freq: Every day | ORAL | Status: DC

## 2014-10-16 MED ORDER — FOLIC ACID 1 MG PO TABS: 1.0000 mg | ORAL_TABLET | Freq: Every day | ORAL | Status: DC

## 2014-10-16 MED ORDER — MORPHINE SULFATE ER 30 MG PO TBCR: 30.0000 mg | EXTENDED_RELEASE_TABLET | Freq: Two times a day (BID) | ORAL | Status: DC
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
## 2014-10-16 NOTE — Patient Instructions (Signed)
Hydroxyurea capsules What is this medicine? HYDROXYUREA (hye drox ee yoor EE a) is a chemotherapy drug. It slows the growth of cancer cells. This medicine is used to treat certain leukemias, skin cancer, head and neck cancer, and advanced ovarian cancer. It is also used to control the painful crises of sickle cell anemia. This medicine may be used for other purposes; ask your health care provider or pharmacist if you have questions. COMMON BRAND NAME(S): Droxia, Hydrea What should I tell my health care provider before I take this medicine? They need to know if you have any of these conditions: -immune system problems -infection (especially a virus infection such as chickenpox, cold sores, or herpes) -kidney disease -low blood counts, like low white cell, platelet, or red cell counts -previous or ongoing radiation therapy -an unusual or allergic reaction to hydroxyurea, other chemotherapy, other medicines, foods, dyes, or preservatives -pregnant or trying to get pregnant -breast-feeding How should I use this medicine? Take this medicine by mouth with a glass of water. Follow the directions on the prescription label. Take your medicine at regular intervals. Do not take it more often than directed. Do not stop taking except on your doctor's advice. People who are not taking this medicine should not be exposed to it. Wash your hands before and after handling your bottle or medicine. Caregivers should wear disposable gloves if they must touch the bottle or medicine. Clean up any medicine powder that spills with a damp disposable towel and throw the towel away in a closed container, such as a plastic bag. Talk to your pediatrician regarding the use of this medicine in children. Special care may be needed. Patients over 65 years old may have a stronger reaction and need a smaller dose. Overdosage: If you think you have taken too much of this medicine contact a poison control center or emergency room at  once. NOTE: This medicine is only for you. Do not share this medicine with others. What if I miss a dose? If you miss a dose, take it as soon as you can. If it is almost time for your next dose, take only that dose. Do not take double or extra doses. What may interact with this medicine? -didanosine -other chemotherapy agents -stavudine -tenofovir -vaccines This list may not describe all possible interactions. Give your health care provider a list of all the medicines, herbs, non-prescription drugs, or dietary supplements you use. Also tell them if you smoke, drink alcohol, or use illegal drugs. Some items may interact with your medicine. What should I watch for while using this medicine? This drug may make you feel generally unwell. This is not uncommon, as chemotherapy can affect healthy cells as well as cancer cells. Report any side effects. Continue your course of treatment even though you feel ill unless your doctor tells you to stop. You will receive regular blood tests during your treatment. Call your doctor or health care professional for advice if you get a fever, chills or sore throat, or other symptoms of a cold or flu. Do not treat yourself. This drug decreases your body's ability to fight infections. Try to avoid being around people who are sick. This medicine may increase your risk to bruise or bleed. Call your doctor or health care professional if you notice any unusual bleeding. Be careful brushing and flossing your teeth or using a toothpick because you may get an infection or bleed more easily. If you have any dental work done, tell your dentist you are   receiving this medicine. Avoid taking products that contain aspirin, acetaminophen, ibuprofen, naproxen, or ketoprofen unless instructed by your doctor. These medicines may hide a fever. Do not become pregnant while taking this medicine. Women should inform their doctor if they wish to become pregnant or think they might be  pregnant. There is a potential for serious side effects to an unborn child. Men should inform their doctors if they wish to father a child. This medicine may lower sperm counts. Talk to your health care professional or pharmacist for more information. Do not breast-feed an infant while taking this medicine. What side effects may I notice from receiving this medicine? Side effects that you should report to your doctor or health care professional as soon as possible: -allergic reactions like skin rash, itching or hives, swelling of the face, lips, or tongue -low blood counts - this medicine may decrease the number of white blood cells, red blood cells and platelets. You may be at increased risk for infections and bleeding. -signs of infection - fever or chills, cough, sore throat, pain or difficulty passing urine -signs of decreased platelets or bleeding - bruising, pinpoint red spots on the skin, black, tarry stools, blood in the urine -signs of decreased red blood cells - unusually weak or tired, fainting spells, lightheadedness -breathing problems -burning, redness or pain at the site of any radiation therapy -changes in skin color -confusion -mouth sores -pain, tingling, numbness in the hands or feet -seizures -skin ulcers -trouble passing urine or change in the amount of urine -vomiting Side effects that usually do not require medical attention (report to your doctor or health care professional if they continue or are bothersome): -headache -loss of appetite -red color to the face This list may not describe all possible side effects. Call your doctor for medical advice about side effects. You may report side effects to FDA at 1-800-FDA-1088. Where should I keep my medicine? Keep out of the reach of children. Store at room temperature between 15 and 30 degrees C (59 and 86 degrees F). Keep tightly closed. Throw away any unused medicine after the expiration date. NOTE: This sheet is a  summary. It may not cover all possible information. If you have questions about this medicine, talk to your doctor, pharmacist, or health care provider.  2015, [employer]vier/[employer] Standard. (2007-06-10 15:03:29) Sickle Cell Anemia, Adult Sickle cell anemia is a condition in which red blood cells have an abnormal "sickle" shape. This abnormal shape shortens the cells' life span, which results in a lower than normal concentration of red blood cells in the blood. The sickle shape also causes the cells to clump together and block free blood flow through the blood vessels. As a result, the tissues and organs of the body do not receive enough oxygen. Sickle cell anemia causes organ damage and pain and increases the risk of infection. CAUSES  Sickle cell anemia is a genetic disorder. Those who receive two copies of the gene have the condition, and those who receive one copy have the trait. RISK FACTORS The sickle cell gene is most common in people whose families originated in [place]. Other areas of the globe where sickle cell trait occurs include the [place], [place] and [place], and the [place].  SIGNS AND SYMPTOMS  Pain, especially in the extremities, back, chest, or abdomen (common). The pain may start suddenly or may develop following an illness, especially if there is dehydration. Pain can also occur due to overexertion or exposure to extreme  temperature changes.  Frequent severe bacterial infections, especially certain types of pneumonia and meningitis.  Pain and swelling in the hands and feet.  Decreased activity.   Loss of appetite.   Change in behavior.  Headaches.  Seizures.  Shortness of breath or difficulty breathing.  Vision changes.  Skin ulcers. Those with the trait may not have symptoms or they may have mild symptoms.  DIAGNOSIS  Sickle cell anemia is diagnosed with blood tests that demonstrate the genetic trait. It is often diagnosed during the  newborn period, due to mandatory testing nationwide. A variety of blood tests, X-rays, CT scans, MRI scans, ultrasounds, and lung function tests may also be done to monitor the condition. TREATMENT  Sickle cell anemia may be treated with:  Medicines. You may be given pain medicines, antibiotic medicines (to treat and prevent infections) or medicines to increase the production of certain types of hemoglobin.  Fluids.  Oxygen.  Blood transfusions. HOME CARE INSTRUCTIONS   Drink enough fluid to keep your urine clear or pale yellow. Increase your fluid intake in hot weather and during exercise.  Do not smoke. Smoking lowers oxygen levels in the blood.   Only take over-the-counter or prescription medicines for pain, fever, or discomfort as directed by your health care provider.  Take antibiotics as directed by your health care provider. Make sure you finish them it even if you start to feel better.   Take supplements as directed by your health care provider.   Consider wearing a medical alert bracelet. This tells anyone caring for you in an emergency of your condition.   When traveling, keep your medical information, health care provider's names, and the medicines you take with you at all times.   If you develop a fever, do not take medicines to reduce the fever right away. This could cover up a problem that is developing. Notify your health care provider.  Keep all follow-up appointments with your health care provider. Sickle cell anemia requires regular medical care. SEEK MEDICAL CARE IF: You have a fever. SEEK IMMEDIATE MEDICAL CARE IF:   You feel dizzy or faint.   You have new abdominal pain, especially on the left side near the stomach area.   You develop a persistent, often uncomfortable and painful penile erection (priapism). If this is not treated immediately it will lead to impotence.   You have numbness your arms or legs or you have a hard time moving them.    You have a hard time with speech.   You have a fever or persistent symptoms for more than 2-3 days.   You have a fever and your symptoms suddenly get worse.   You have signs or symptoms of infection. These include:   Chills.   Abnormal tiredness (lethargy).   Irritability.   Poor eating.   Vomiting.   You develop pain that is not helped with medicine.   You develop shortness of breath.  You have pain in your chest.   You are coughing up pus-like or bloody sputum.   You develop a stiff neck.  Your feet or hands swell or have pain.  Your abdomen appears bloated.  You develop joint pain. MAKE SURE YOU:  Understand these instructions. Document Released: 05/06/2005 Document Revised: 06/12/2013 Document Reviewed: 09/07/2012 [facility] Patient Information 2015 [place], [place]. This information is not intended to replace advice given to you by your health care provider. Make sure you discuss any questions you have with your health care provider. Smoking Cessation Quitting  smoking is important to your health and has many advantages. However, it is not always easy to quit since nicotine is a very addictive drug. Oftentimes, people try 3 times or more before being able to quit. This document explains the best ways for you to prepare to quit smoking. Quitting takes hard work and a lot of effort, but you can do it. ADVANTAGES OF QUITTING SMOKING  You will live longer, feel better, and live better.  Your body will feel the impact of quitting smoking almost immediately.  Within 20 minutes, blood pressure decreases. Your pulse returns to its normal level.  After 8 hours, carbon monoxide levels in the blood return to normal. Your oxygen level increases.  After 24 hours, the chance of having a heart attack starts to decrease. Your breath, hair, and body stop smelling like smoke.  After 48 hours, damaged nerve endings begin to recover. Your sense of taste and smell  improve.  After 72 hours, the body is virtually free of nicotine. Your bronchial tubes relax and breathing becomes easier.  After 2 to 12 weeks, lungs can hold more air. Exercise becomes easier and circulation improves.  The risk of having a heart attack, stroke, cancer, or lung disease is greatly reduced.  After 1 year, the risk of coronary heart disease is cut in half.  After 5 years, the risk of stroke falls to the same as a nonsmoker.  After 10 years, the risk of lung cancer is cut in half and the risk of other cancers decreases significantly.  After 15 years, the risk of coronary heart disease drops, usually to the level of a nonsmoker.  If you are pregnant, quitting smoking will improve your chances of having a healthy baby.  The people you live with, especially any children, will be healthier.  You will have extra money to spend on things other than cigarettes. QUESTIONS TO THINK ABOUT BEFORE ATTEMPTING TO QUIT You may want to talk about your answers with your health care provider.  Why do you want to quit?  If you tried to quit in the past, what helped and what did not?  What will be the most difficult situations for you after you quit? How will you plan to handle them?  Who can help you through the tough times? Your family? Friends? A health care provider?  What pleasures do you get from smoking? What ways can you still get pleasure if you quit? Here are some questions to ask your health care provider:  How can you help me to be successful at quitting?  What medicine do you think would be best for me and how should I take it?  What should I do if I need more help?  What is smoking withdrawal like? How can I get information on withdrawal? GET READY  Set a quit date.  Change your environment by getting rid of all cigarettes, ashtrays, matches, and lighters in your home, car, or work. Do not let people smoke in your home.  Review your past attempts to quit. Think  about what worked and what did not. GET SUPPORT AND ENCOURAGEMENT You have a better chance of being successful if you have help. You can get support in many ways.  Tell your family, friends, and coworkers that you are going to quit and need their support. Ask them not to smoke around you.  Get individual, group, or telephone counseling and support. Programs are available at [employer] and health centers. Call your local  health department for information about programs in your area.  Spiritual beliefs and practices may help some smokers quit.  Download a "quit meter" on your computer to keep track of quit statistics, such as how long you have gone without smoking, cigarettes not smoked, and money saved.  Get a self-help book about quitting smoking and staying off tobacco. LEARN NEW SKILLS AND BEHAVIORS  Distract yourself from urges to smoke. Talk to someone, go for a walk, or occupy your time with a task.  Change your normal routine. Take a different route to work. Drink tea instead of coffee. Eat breakfast in a different place.  Reduce your stress. Take a hot bath, exercise, or read a book.  Plan something enjoyable to do every day. Reward yourself for not smoking.  Explore interactive web-based programs that specialize in helping you quit. GET MEDICINE AND USE IT CORRECTLY Medicines can help you stop smoking and decrease the urge to smoke. Combining medicine with the above behavioral methods and support can greatly increase your chances of successfully quitting smoking.  Nicotine replacement therapy helps deliver nicotine to your body without the negative effects and risks of smoking. Nicotine replacement therapy includes nicotine gum, lozenges, inhalers, nasal sprays, and skin patches. Some may be available over-the-counter and others require a prescription.  Antidepressant medicine helps people abstain from smoking, but how this works is unknown. This medicine is available by  prescription.  Nicotinic receptor partial agonist medicine simulates the effect of nicotine in your brain. This medicine is available by prescription. Ask your health care provider for advice about which medicines to use and how to use them based on your health history. Your health care provider will tell you what side effects to look out for if you choose to be on a medicine or therapy. Carefully read the information on the package. Do not use any other product containing nicotine while using a nicotine replacement product.  RELAPSE OR DIFFICULT SITUATIONS Most relapses occur within the first 3 months after quitting. Do not be discouraged if you start smoking again. Remember, most people try several times before finally quitting. You may have symptoms of withdrawal because your body is used to nicotine. You may crave cigarettes, be irritable, feel very hungry, cough often, get headaches, or have difficulty concentrating. The withdrawal symptoms are only temporary. They are strongest when you first quit, but they will go away within 10-14 days. To reduce the chances of relapse, try to:  Avoid drinking alcohol. Drinking lowers your chances of successfully quitting.  Reduce the amount of caffeine you consume. Once you quit smoking, the amount of caffeine in your body increases and can give you symptoms, such as a rapid heartbeat, sweating, and anxiety.  Avoid smokers because they can make you want to smoke.  Do not let weight gain distract you. Many smokers will gain weight when they quit, usually less than 10 pounds. Eat a healthy diet and stay active. You can always lose the weight gained after you quit.  Find ways to improve your mood other than smoking. FOR MORE INFORMATION  www.smokefree.gov  Document Released: 01/20/2001 Document Revised: 06/12/2013 Document Reviewed: 05/07/2011 [facility] Patient Information 2015 [place], [place]. This information is not intended to replace advice given to you  by your health care provider. Make sure you discuss any questions you have with your health care provider.

## 2014-10-17 ENCOUNTER — Encounter (HOSPITAL_COMMUNITY): Payer: Self-pay

## 2014-10-17 ENCOUNTER — Emergency Department (HOSPITAL_COMMUNITY)
Admission: EM | Admit: 2014-10-17 | Discharge: 2014-10-17 | Discharge status: Against Medical Advice | Payer: Medicaid - Out of State | Attending: Emergency Medicine | Admitting: Emergency Medicine

## 2014-10-17 DIAGNOSIS — Z79899 Other long term (current) drug therapy: Secondary | ICD-10-CM | POA: Diagnosis not present

## 2014-10-17 DIAGNOSIS — Z720 Tobacco use: Secondary | ICD-10-CM | POA: Diagnosis not present

## 2014-10-17 DIAGNOSIS — D5700 Hb-SS disease with crisis, unspecified: Secondary | ICD-10-CM | POA: Diagnosis present

## 2014-10-17 LAB — RETICULOCYTES: RBC.: 1.83 MIL/uL — AB (ref 4.22–5.81)

## 2014-10-17 LAB — CBC WITH DIFFERENTIAL/PLATELET
BASOS ABS: 0.2 10*3/uL — AB (ref 0.0–0.1)
Basophils Relative: 1 % (ref 0–1)
EOS ABS: 0.8 10*3/uL — AB (ref 0.0–0.7)
Eosinophils Relative: 5 % (ref 0–5)
HCT: 22.7 % — ABNORMAL LOW (ref 39.0–52.0)
HEMOGLOBIN: 7.7 g/dL — AB (ref 13.0–17.0)
LYMPHS PCT: 19 % (ref 12–46)
Lymphs Abs: 3.1 10*3/uL (ref 0.7–4.0)
MCH: 31.8 pg (ref 26.0–34.0)
MCHC: 33.9 g/dL (ref 30.0–36.0)
MCV: 93.8 fL (ref 78.0–100.0)
Monocytes Absolute: 1.6 10*3/uL — ABNORMAL HIGH (ref 0.1–1.0)
Monocytes Relative: 10 % (ref 3–12)
NEUTROS ABS: 10.7 10*3/uL — AB (ref 1.7–7.7)
NEUTROS PCT: 65 % (ref 43–77)
Platelets: 296 10*3/uL (ref 150–400)
RBC: 2.42 MIL/uL — ABNORMAL LOW (ref 4.22–5.81)
RDW: 21.6 % — ABNORMAL HIGH (ref 11.5–15.5)
WBC: 16.4 10*3/uL — AB (ref 4.0–10.5)

## 2014-10-17 LAB — COMPREHENSIVE METABOLIC PANEL
ALK PHOS: 97 U/L (ref 38–126)
ALT: 20 U/L (ref 17–63)
ANION GAP: 9 (ref 5–15)
AST: 40 U/L (ref 15–41)
Albumin: 4.5 g/dL (ref 3.5–5.0)
BILIRUBIN TOTAL: 4.4 mg/dL — AB (ref 0.3–1.2)
BUN: 7 mg/dL (ref 6–20)
CALCIUM: 9.5 mg/dL (ref 8.9–10.3)
CO2: 24 mmol/L (ref 22–32)
Chloride: 107 mmol/L (ref 101–111)
Creatinine, Ser: 0.48 mg/dL — ABNORMAL LOW (ref 0.61–1.24)
GFR calc non Af Amer: >60 mL/min (ref >60)
Glucose, Bld: 107 mg/dL — ABNORMAL HIGH (ref 65–99)
Potassium: 4.1 mmol/L (ref 3.5–5.1)
Sodium: 140 mmol/L (ref 135–145)
TOTAL PROTEIN: 8.7 g/dL — AB (ref 6.5–8.1)

## 2014-10-17 MED ORDER — DIPHENHYDRAMINE HCL 50 MG/ML IJ SOLN
25.0000 mg | Freq: Once | INTRAMUSCULAR | Status: DC
  Filled 2014-10-17: qty 1

## 2014-10-17 MED ORDER — SODIUM CHLORIDE 0.45 % IV SOLN
INTRAVENOUS | Status: DC
  Administered 2014-10-17: 13:00:00 via INTRAVENOUS

## 2014-10-17 MED ORDER — HYDROMORPHONE HCL 2 MG/ML IJ SOLN: 0.0250 mg/kg | INTRAMUSCULAR | Status: DC

## 2014-10-17 MED ORDER — ONDANSETRON HCL 4 MG/2ML IJ SOLN: 4.0000 mg | Freq: Once | INTRAMUSCULAR | Status: DC

## 2014-10-17 MED ORDER — HYDROMORPHONE HCL 1 MG/ML IJ SOLN
1.0000 mg | INTRAMUSCULAR | Status: AC
  Administered 2014-10-17 (×3): 1 mg via INTRAVENOUS
  Filled 2014-10-17 (×3): qty 1

## 2014-10-17 MED ORDER — DIPHENHYDRAMINE HCL 50 MG/ML IJ SOLN
25.0000 mg | Freq: Once | INTRAMUSCULAR | Status: AC
  Administered 2014-10-17: 25 mg via INTRAVENOUS
  Filled 2014-10-17: qty 1

## 2014-10-17 MED ORDER — ONDANSETRON HCL 4 MG/2ML IJ SOLN
4.0000 mg | INTRAMUSCULAR | Status: DC | PRN
  Administered 2014-10-17: 4 mg via INTRAVENOUS
  Filled 2014-10-17: qty 2
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
## 2014-10-17 NOTE — ED Notes (Signed)
Pt c/o generalized back and BLE pain x 3 days r/t Sickle Cell Crisis.  Pain score 8/10.  Pt reports taking all prescribed medications w/o relief.

## 2014-10-17 NOTE — ED Notes (Signed)
Walked in room in order to administer benadryl . Found pt dressed and ready to leave the room after he removed his IV. Pt stated that he has an emergency at home and needs to leave. Joni Reining, [place] made aware.

## 2014-10-17 NOTE — ED Notes (Signed)
Bed: WA02 Expected date:  Expected time:  Means of arrival:  Comments: 

## 2014-10-18 ENCOUNTER — Emergency Department (HOSPITAL_COMMUNITY)
Admission: EM | Admit: 2014-10-18 | Discharge: 2014-10-18 | Disposition: A | Discharge status: Home / Self Care | Payer: Medicaid - Out of State | Attending: Emergency Medicine | Admitting: Emergency Medicine

## 2014-10-18 ENCOUNTER — Encounter (HOSPITAL_COMMUNITY): Payer: Self-pay | Admitting: Emergency Medicine

## 2014-10-18 DIAGNOSIS — Z79899 Other long term (current) drug therapy: Secondary | ICD-10-CM | POA: Diagnosis not present

## 2014-10-18 DIAGNOSIS — Z720 Tobacco use: Secondary | ICD-10-CM | POA: Insufficient documentation

## 2014-10-18 DIAGNOSIS — D5700 Hb-SS disease with crisis, unspecified: Secondary | ICD-10-CM | POA: Diagnosis present

## 2014-10-18 MED ORDER — DEXTROSE-NACL 5-0.45 % IV SOLN
INTRAVENOUS | Status: DC
  Administered 2014-10-18: 10:00:00 via INTRAVENOUS

## 2014-10-18 MED ORDER — HYDROMORPHONE HCL 2 MG/ML IJ SOLN
2.0000 mg | INTRAMUSCULAR | Status: AC | PRN
  Administered 2014-10-18 (×3): 2 mg via INTRAVENOUS
  Filled 2014-10-18 (×3): qty 1

## 2014-10-18 MED ORDER — HYDROMORPHONE HCL 2 MG/ML IJ SOLN
2.0000 mg | Freq: Once | INTRAMUSCULAR | Status: AC
  Administered 2014-10-18: 2 mg via INTRAVENOUS
  Filled 2014-10-18: qty 1

## 2014-10-18 MED ORDER — KETOROLAC TROMETHAMINE 15 MG/ML IJ SOLN
15.0000 mg | Freq: Once | INTRAMUSCULAR | Status: AC
  Administered 2014-10-18: 15 mg via INTRAVENOUS
  Filled 2014-10-18: qty 1

## 2014-10-18 MED ORDER — DIPHENHYDRAMINE HCL 50 MG/ML IJ SOLN
25.0000 mg | INTRAMUSCULAR | Status: AC | PRN
  Administered 2014-10-18 (×2): 25 mg via INTRAVENOUS
  Filled 2014-10-18 (×2): qty 1
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
## 2014-10-18 NOTE — ED Notes (Signed)
Pt left prior to receiving discharge papers.

## 2014-10-18 NOTE — ED Notes (Signed)
Pt in [facility], c/o pain to back and bilateral legs. Was seen yesterday for same.

## 2014-10-18 NOTE — Discharge Instructions (Signed)
Sickle Cell Anemia, Adult °Sickle cell anemia is a condition in which red blood cells have an abnormal "sickle" shape. This abnormal shape shortens the cells' life span, which results in a lower than normal concentration of red blood cells in the blood. The sickle shape also causes the cells to clump together and block free blood flow through the blood vessels. As a result, the tissues and organs of the body do not receive enough oxygen. Sickle cell anemia causes organ damage and pain and increases the risk of infection. °CAUSES  °Sickle cell anemia is a genetic disorder. Those who receive two copies of the gene have the condition, and those who receive one copy have the trait. °RISK FACTORS °The sickle cell gene is most common in people whose families originated in [place]. Other areas of the globe where sickle cell trait occurs include the Mediterranean, [place] and [place], the [place], and the [place].  °SIGNS AND SYMPTOMS °· Pain, especially in the extremities, back, chest, or abdomen (common). The pain may start suddenly or may develop following an illness, especially if there is dehydration. Pain can also occur due to overexertion or exposure to extreme temperature changes. °· Frequent severe bacterial infections, especially certain types of pneumonia and meningitis. °· Pain and swelling in the hands and feet. °· Decreased activity.   °· Loss of appetite.   °· Change in behavior. °· Headaches. °· Seizures. °· Shortness of breath or difficulty breathing. °· Vision changes. °· Skin ulcers. °Those with the trait may not have symptoms or they may have mild symptoms.  °DIAGNOSIS  °Sickle cell anemia is diagnosed with blood tests that demonstrate the genetic trait. It is often diagnosed during the newborn period, due to mandatory testing nationwide. A variety of blood tests, X-rays, CT scans, MRI scans, ultrasounds, and lung function tests may also be done to monitor the condition. °TREATMENT  °Sickle  cell anemia may be treated with: °· Medicines. You may be given pain medicines, antibiotic medicines (to treat and prevent infections) or medicines to increase the production of certain types of hemoglobin. °· Fluids. °· Oxygen. °· Blood transfusions. °HOME CARE INSTRUCTIONS  °· Drink enough fluid to keep your urine clear or pale yellow. Increase your fluid intake in hot weather and during exercise. °· Do not smoke. Smoking lowers oxygen levels in the blood.   °· Only take over-the-counter or prescription medicines for pain, fever, or discomfort as directed by your health care provider. °· Take antibiotics as directed by your health care provider. Make sure you finish them it even if you start to feel better.   °· Take supplements as directed by your health care provider.   °· Consider wearing a medical alert bracelet. This tells anyone caring for you in an emergency of your condition.   °· When traveling, keep your medical information, health care provider's names, and the medicines you take with you at all times.   °· If you develop a fever, do not take medicines to reduce the fever right away. This could cover up a problem that is developing. Notify your health care provider. °· Keep all follow-up appointments with your health care provider. Sickle cell anemia requires regular medical care. °SEEK MEDICAL CARE IF: ° You have a fever. °SEEK IMMEDIATE MEDICAL CARE IF:  °· You feel dizzy or faint.   °· You have new abdominal pain, especially on the left side near the stomach area.   °· You develop a persistent, often uncomfortable and painful penile erection (priapism). If this is not treated immediately it   will lead to impotence.   °· You have numbness your arms or legs or you have a hard time moving them.   °· You have a hard time with speech.   °· You have a fever or persistent symptoms for more than 2-3 days.   °· You have a fever and your symptoms suddenly get worse.   °· You have signs or symptoms of infection.  These include:   °¨ Chills.   °¨ Abnormal tiredness (lethargy).   °¨ Irritability.   °¨ Poor eating.   °¨ Vomiting.   °· You develop pain that is not helped with medicine.   °· You develop shortness of breath. °· You have pain in your chest.   °· You are coughing up pus-like or bloody sputum.   °· You develop a stiff neck. °· Your feet or hands swell or have pain. °· Your abdomen appears bloated. °· You develop joint pain. °MAKE SURE YOU: °· Understand these instructions. °Document Released: 05/06/2005 Document Revised: 06/12/2013 Document Reviewed: 09/07/2012 °[employer]® Patient Information ©2015 [employer]. This information is not intended to replace advice given to you by your health care provider. Make sure you discuss any questions you have with your health care provider. ° °

## 2014-10-19 ENCOUNTER — Encounter (HOSPITAL_COMMUNITY): Payer: Self-pay

## 2014-10-19 ENCOUNTER — Emergency Department (HOSPITAL_COMMUNITY)
Admission: EM | Admit: 2014-10-19 | Discharge: 2014-10-19 | Disposition: A | Discharge status: Home / Self Care | Payer: Medicaid - Out of State | Attending: Emergency Medicine | Admitting: Emergency Medicine

## 2014-10-19 DIAGNOSIS — Z720 Tobacco use: Secondary | ICD-10-CM | POA: Insufficient documentation

## 2014-10-19 DIAGNOSIS — Z79899 Other long term (current) drug therapy: Secondary | ICD-10-CM | POA: Diagnosis not present

## 2014-10-19 DIAGNOSIS — D5700 Hb-SS disease with crisis, unspecified: Secondary | ICD-10-CM | POA: Diagnosis not present

## 2014-10-19 LAB — COMPREHENSIVE METABOLIC PANEL
ALBUMIN: 4.6 g/dL (ref 3.5–5.0)
ALK PHOS: 90 U/L (ref 38–126)
ALT: 17 U/L (ref 17–63)
ANION GAP: 4 — AB (ref 5–15)
AST: 28 U/L (ref 15–41)
BILIRUBIN TOTAL: 3.6 mg/dL — AB (ref 0.3–1.2)
BUN: 6 mg/dL (ref 6–20)
CALCIUM: 9.4 mg/dL (ref 8.9–10.3)
CO2: 30 mmol/L (ref 22–32)
CREATININE: 0.51 mg/dL — AB (ref 0.61–1.24)
Chloride: 109 mmol/L (ref 101–111)
GFR calc Af Amer: >60 mL/min (ref >60)
GFR calc non Af Amer: >60 mL/min (ref >60)
GLUCOSE: 78 mg/dL (ref 65–99)
Potassium: 4 mmol/L (ref 3.5–5.1)
SODIUM: 143 mmol/L (ref 135–145)
TOTAL PROTEIN: 8.1 g/dL (ref 6.5–8.1)

## 2014-10-19 LAB — CBC WITH DIFFERENTIAL/PLATELET
BASOS ABS: 0 10*3/uL (ref 0.0–0.1)
Basophils Relative: 0 % (ref 0–1)
EOS ABS: 0.9 10*3/uL — AB (ref 0.0–0.7)
Eosinophils Relative: 5 % (ref 0–5)
HEMATOCRIT: 21.7 % — AB (ref 39.0–52.0)
HEMOGLOBIN: 7.5 g/dL — AB (ref 13.0–17.0)
LYMPHS PCT: 17 % (ref 12–46)
Lymphs Abs: 3 10*3/uL (ref 0.7–4.0)
MCH: 32.2 pg (ref 26.0–34.0)
MCHC: 34.6 g/dL (ref 30.0–36.0)
MCV: 93.1 fL (ref 78.0–100.0)
MONOS PCT: 11 % (ref 3–12)
Monocytes Absolute: 2 10*3/uL — ABNORMAL HIGH (ref 0.1–1.0)
NEUTROS ABS: 12 10*3/uL — AB (ref 1.7–7.7)
NEUTROS PCT: 67 % (ref 43–77)
Platelets: 340 10*3/uL (ref 150–400)
RBC: 2.33 MIL/uL — ABNORMAL LOW (ref 4.22–5.81)
RDW: 21.8 % — ABNORMAL HIGH (ref 11.5–15.5)
WBC: 17.9 10*3/uL — ABNORMAL HIGH (ref 4.0–10.5)

## 2014-10-19 LAB — RETICULOCYTES
RBC.: 2.33 MIL/uL — ABNORMAL LOW (ref 4.22–5.81)
RETIC COUNT ABSOLUTE: 517.3 10*3/uL — AB (ref 19.0–186.0)
Retic Ct Pct: 22.2 % — ABNORMAL HIGH (ref 0.4–3.1)

## 2014-10-19 MED ORDER — SODIUM CHLORIDE 0.9 % IV BOLUS (SEPSIS)
1000.0000 mL | Freq: Once | INTRAVENOUS | Status: AC
  Administered 2014-10-19: 1000 mL via INTRAVENOUS

## 2014-10-19 MED ORDER — HYDROMORPHONE HCL 1 MG/ML IJ SOLN
1.0000 mg | Freq: Once | INTRAMUSCULAR | Status: AC
  Administered 2014-10-19: 1 mg via INTRAVENOUS
  Filled 2014-10-19: qty 1

## 2014-10-19 MED ORDER — DIPHENHYDRAMINE HCL 25 MG PO CAPS
25.0000 mg | ORAL_CAPSULE | Freq: Once | ORAL | Status: AC
  Administered 2014-10-19: 25 mg via ORAL
  Filled 2014-10-19: qty 1

## 2014-10-19 MED ORDER — SODIUM CHLORIDE 0.9 % IV SOLN: Freq: Once | INTRAVENOUS | Status: DC

## 2014-10-19 NOTE — ED Notes (Signed)
Pt has sickle cell.  Has had back pain for a few days.  Has been seen here multiple x this week.  States pain is not getting any better.
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
## 2014-10-19 NOTE — ED Notes (Signed)
Attempted IV twice, both attempts unsuccessful. Gave pt a warm blanket, will alert on-coming nurse.

## 2014-10-19 NOTE — ED Notes (Signed)
Pt tried to leave without telling nursing staff or MD. Pt pulled out IV and left. Pt came back to get cell phone charger. Informed pt of need to let nursing or medical staff know he wants to be discharged prior to leaving to evaluate discharge condition.

## 2014-10-19 NOTE — Discharge Instructions (Signed)
Sickle Cell Anemia, Adult °Sickle cell anemia is a condition in which red blood cells have an abnormal "sickle" shape. This abnormal shape shortens the cells' life span, which results in a lower than normal concentration of red blood cells in the blood. The sickle shape also causes the cells to clump together and block free blood flow through the blood vessels. As a result, the tissues and organs of the body do not receive enough oxygen. Sickle cell anemia causes organ damage and pain and increases the risk of infection. °CAUSES  °Sickle cell anemia is a genetic disorder. Those who receive two copies of the gene have the condition, and those who receive one copy have the trait. °RISK FACTORS °The sickle cell gene is most common in people whose families originated in [place]. Other areas of the globe where sickle cell trait occurs include the Mediterranean, [place] and [place], the [place], and the [place].  °SIGNS AND SYMPTOMS °· Pain, especially in the extremities, back, chest, or abdomen (common). The pain may start suddenly or may develop following an illness, especially if there is dehydration. Pain can also occur due to overexertion or exposure to extreme temperature changes. °· Frequent severe bacterial infections, especially certain types of pneumonia and meningitis. °· Pain and swelling in the hands and feet. °· Decreased activity.   °· Loss of appetite.   °· Change in behavior. °· Headaches. °· Seizures. °· Shortness of breath or difficulty breathing. °· Vision changes. °· Skin ulcers. °Those with the trait may not have symptoms or they may have mild symptoms.  °DIAGNOSIS  °Sickle cell anemia is diagnosed with blood tests that demonstrate the genetic trait. It is often diagnosed during the newborn period, due to mandatory testing nationwide. A variety of blood tests, X-rays, CT scans, MRI scans, ultrasounds, and lung function tests may also be done to monitor the condition. °TREATMENT  °Sickle  cell anemia may be treated with: °· Medicines. You may be given pain medicines, antibiotic medicines (to treat and prevent infections) or medicines to increase the production of certain types of hemoglobin. °· Fluids. °· Oxygen. °· Blood transfusions. °HOME CARE INSTRUCTIONS  °· Drink enough fluid to keep your urine clear or pale yellow. Increase your fluid intake in hot weather and during exercise. °· Do not smoke. Smoking lowers oxygen levels in the blood.   °· Only take over-the-counter or prescription medicines for pain, fever, or discomfort as directed by your health care provider. °· Take antibiotics as directed by your health care provider. Make sure you finish them it even if you start to feel better.   °· Take supplements as directed by your health care provider.   °· Consider wearing a medical alert bracelet. This tells anyone caring for you in an emergency of your condition.   °· When traveling, keep your medical information, health care provider's names, and the medicines you take with you at all times.   °· If you develop a fever, do not take medicines to reduce the fever right away. This could cover up a problem that is developing. Notify your health care provider. °· Keep all follow-up appointments with your health care provider. Sickle cell anemia requires regular medical care. °SEEK MEDICAL CARE IF: ° You have a fever. °SEEK IMMEDIATE MEDICAL CARE IF:  °· You feel dizzy or faint.   °· You have new abdominal pain, especially on the left side near the stomach area.   °· You develop a persistent, often uncomfortable and painful penile erection (priapism). If this is not treated immediately it   will lead to impotence.   °· You have numbness your arms or legs or you have a hard time moving them.   °· You have a hard time with speech.   °· You have a fever or persistent symptoms for more than 2-3 days.   °· You have a fever and your symptoms suddenly get worse.   °· You have signs or symptoms of infection.  These include:   °¨ Chills.   °¨ Abnormal tiredness (lethargy).   °¨ Irritability.   °¨ Poor eating.   °¨ Vomiting.   °· You develop pain that is not helped with medicine.   °· You develop shortness of breath. °· You have pain in your chest.   °· You are coughing up pus-like or bloody sputum.   °· You develop a stiff neck. °· Your feet or hands swell or have pain. °· Your abdomen appears bloated. °· You develop joint pain. °MAKE SURE YOU: °· Understand these instructions. °Document Released: 05/06/2005 Document Revised: 06/12/2013 Document Reviewed: 09/07/2012 °[employer]® Patient Information ©2015 [employer]. This information is not intended to replace advice given to you by your health care provider. Make sure you discuss any questions you have with your health care provider. ° °

## 2014-10-22 ENCOUNTER — Encounter (HOSPITAL_COMMUNITY): Payer: Self-pay

## 2014-10-22 ENCOUNTER — Emergency Department (HOSPITAL_COMMUNITY)
Admission: EM | Admit: 2014-10-22 | Discharge: 2014-10-22 | Disposition: A | Discharge status: Home / Self Care | Payer: Medicaid - Out of State | Attending: Emergency Medicine | Admitting: Emergency Medicine

## 2014-10-22 DIAGNOSIS — D5700 Hb-SS disease with crisis, unspecified: Secondary | ICD-10-CM | POA: Diagnosis not present

## 2014-10-22 DIAGNOSIS — Z79899 Other long term (current) drug therapy: Secondary | ICD-10-CM | POA: Diagnosis not present

## 2014-10-22 DIAGNOSIS — Z720 Tobacco use: Secondary | ICD-10-CM | POA: Diagnosis not present

## 2014-10-22 LAB — RETICULOCYTES
RBC.: 2.31 MIL/uL — ABNORMAL LOW (ref 4.22–5.81)
Retic Count, Absolute: 422.7 K/uL — ABNORMAL HIGH (ref 19.0–186.0)
Retic Ct Pct: 18.3 % — ABNORMAL HIGH (ref 0.4–3.1)

## 2014-10-22 LAB — CBC WITH DIFFERENTIAL/PLATELET
Basophils Absolute: 0.1 K/uL (ref 0.0–0.1)
Basophils Relative: 1 % (ref 0–1)
Eosinophils Absolute: 1 K/uL — ABNORMAL HIGH (ref 0.0–0.7)
Eosinophils Relative: 7 % — ABNORMAL HIGH (ref 0–5)
HCT: 21.2 % — ABNORMAL LOW (ref 39.0–52.0)
Hemoglobin: 7.3 g/dL — ABNORMAL LOW (ref 13.0–17.0)
Lymphocytes Relative: 31 % (ref 12–46)
Lymphs Abs: 4.6 K/uL — ABNORMAL HIGH (ref 0.7–4.0)
MCH: 31.3 pg (ref 26.0–34.0)
MCHC: 34.4 g/dL (ref 30.0–36.0)
MCV: 91 fL (ref 78.0–100.0)
Monocytes Absolute: 1.6 K/uL — ABNORMAL HIGH (ref 0.1–1.0)
Monocytes Relative: 11 % (ref 3–12)
Neutro Abs: 7.5 K/uL (ref 1.7–7.7)
Neutrophils Relative %: 50 % (ref 43–77)
Platelets: 304 K/uL (ref 150–400)
RBC: 2.33 MIL/uL — ABNORMAL LOW (ref 4.22–5.81)
RDW: 20.3 % — ABNORMAL HIGH (ref 11.5–15.5)
WBC: 14.7 K/uL — ABNORMAL HIGH (ref 4.0–10.5)

## 2014-10-22 LAB — COMPREHENSIVE METABOLIC PANEL
ALBUMIN: 4.1 g/dL (ref 3.5–5.0)
ALK PHOS: 72 U/L (ref 38–126)
ALT: 11 U/L — ABNORMAL LOW (ref 17–63)
ANION GAP: 7 (ref 5–15)
AST: 23 U/L (ref 15–41)
BILIRUBIN TOTAL: 3.7 mg/dL — AB (ref 0.3–1.2)
CO2: 25 mmol/L (ref 22–32)
Calcium: 8.9 mg/dL (ref 8.9–10.3)
Chloride: 108 mmol/L (ref 101–111)
Creatinine, Ser: 0.33 mg/dL — ABNORMAL LOW (ref 0.61–1.24)
GFR calc Af Amer: >60 mL/min (ref >60)
GFR calc non Af Amer: >60 mL/min (ref >60)
GLUCOSE: 85 mg/dL (ref 65–99)
POTASSIUM: 3.7 mmol/L (ref 3.5–5.1)
SODIUM: 140 mmol/L (ref 135–145)
TOTAL PROTEIN: 7.2 g/dL (ref 6.5–8.1)

## 2014-10-22 MED ORDER — OXYCODONE-ACETAMINOPHEN 5-325 MG PO TABS: 2.0000 | ORAL_TABLET | ORAL | Status: DC | PRN

## 2014-10-22 MED ORDER — SODIUM CHLORIDE 0.9 % IV BOLUS (SEPSIS)
1000.0000 mL | Freq: Once | INTRAVENOUS | Status: AC
  Administered 2014-10-22: 1000 mL via INTRAVENOUS

## 2014-10-22 MED ORDER — HYDROMORPHONE HCL 2 MG/ML IJ SOLN
2.0000 mg | Freq: Once | INTRAMUSCULAR | Status: AC
  Administered 2014-10-22: 2 mg via INTRAVENOUS
  Filled 2014-10-22: qty 1

## 2014-10-22 MED ORDER — DIPHENHYDRAMINE HCL 50 MG/ML IJ SOLN
12.5000 mg | Freq: Once | INTRAMUSCULAR | Status: AC
  Administered 2014-10-22: 12.5 mg via INTRAVENOUS
  Filled 2014-10-22: qty 1

## 2014-10-22 MED ORDER — ONDANSETRON HCL 4 MG/2ML IJ SOLN
4.0000 mg | Freq: Once | INTRAMUSCULAR | Status: AC
  Administered 2014-10-22: 4 mg via INTRAVENOUS
  Filled 2014-10-22: qty 2

## 2014-10-22 NOTE — Discharge Instructions (Signed)
1. Medications: percocet, usual home medications 2. Treatment: rest, drink plenty of fluids  3. Follow Up: please followup with your primary doctor for discussion of your diagnoses and further evaluation after today's visit; please return to the ER for high fever, chest pain, shortness of breath, severe pain, weakness, loss of bowel or bladder control, numbness in groin   Sickle Cell Anemia, Adult Sickle cell anemia is a condition in which red blood cells have an abnormal "sickle" shape. This abnormal shape shortens the cells' life span, which results in a lower than normal concentration of red blood cells in the blood. The sickle shape also causes the cells to clump together and block free blood flow through the blood vessels. As a result, the tissues and organs of the body do not receive enough oxygen. Sickle cell anemia causes organ damage and pain and increases the risk of infection. CAUSES  Sickle cell anemia is a genetic disorder. Those who receive two copies of the gene have the condition, and those who receive one copy have the trait. RISK FACTORS The sickle cell gene is most common in people whose families originated in [place]. Other areas of the globe where sickle cell trait occurs include the [place], [place], the [place], and the [place].  SIGNS AND SYMPTOMS  Pain, especially in the extremities, back, chest, or abdomen (common). The pain may start suddenly or may develop following an illness, especially if there is dehydration. Pain can also occur due to overexertion or exposure to extreme temperature changes.  Frequent severe bacterial infections, especially certain types of pneumonia and meningitis.  Pain and swelling in the hands and feet.  Decreased activity.   Loss of appetite.   Change in behavior.  Headaches.  Seizures.  Shortness of breath or difficulty breathing.  Vision changes.  Skin ulcers. Those with the trait may not have  symptoms or they may have mild symptoms.  DIAGNOSIS  Sickle cell anemia is diagnosed with blood tests that demonstrate the genetic trait. It is often diagnosed during the newborn period, due to mandatory testing nationwide. A variety of blood tests, X-rays, CT scans, MRI scans, ultrasounds, and lung function tests may also be done to monitor the condition. TREATMENT  Sickle cell anemia may be treated with:  Medicines. You may be given pain medicines, antibiotic medicines (to treat and prevent infections) or medicines to increase the production of certain types of hemoglobin.  Fluids.  Oxygen.  Blood transfusions. HOME CARE INSTRUCTIONS   Drink enough fluid to keep your urine clear or pale yellow. Increase your fluid intake in hot weather and during exercise.  Do not smoke. Smoking lowers oxygen levels in the blood.   Only take over-the-counter or prescription medicines for pain, fever, or discomfort as directed by your health care provider.  Take antibiotics as directed by your health care provider. Make sure you finish them it even if you start to feel better.   Take supplements as directed by your health care provider.   Consider wearing a medical alert bracelet. This tells anyone caring for you in an emergency of your condition.   When traveling, keep your medical information, health care provider's names, and the medicines you take with you at all times.   If you develop a fever, do not take medicines to reduce the fever right away. This could cover up a problem that is developing. Notify your health care provider.  Keep all follow-up appointments with your health care provider. Sickle cell  anemia requires regular medical care. SEEK MEDICAL CARE IF: You have a fever. SEEK IMMEDIATE MEDICAL CARE IF:   You feel dizzy or faint.   You have new abdominal pain, especially on the left side near the stomach area.   You develop a persistent, often uncomfortable and painful  penile erection (priapism). If this is not treated immediately it will lead to impotence.   You have numbness your arms or legs or you have a hard time moving them.   You have a hard time with speech.   You have a fever or persistent symptoms for more than 2-3 days.   You have a fever and your symptoms suddenly get worse.   You have signs or symptoms of infection. These include:   Chills.   Abnormal tiredness (lethargy).   Irritability.   Poor eating.   Vomiting.   You develop pain that is not helped with medicine.   You develop shortness of breath.  You have pain in your chest.   You are coughing up pus-like or bloody sputum.   You develop a stiff neck.  Your feet or hands swell or have pain.  Your abdomen appears bloated.  You develop joint pain. MAKE SURE YOU:  Understand these instructions. Document Released: 05/06/2005 Document Revised: 06/12/2013 Document Reviewed: 09/07/2012 [facility] Patient Information 2015 [place], [place]. This information is not intended to replace advice given to you by your health care provider. Make sure you discuss any questions you have with your health care provider.
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
## 2014-10-22 NOTE — ED Notes (Signed)
Pt here several times this week for sickle cell pain. Pain not improving. Pain to back and legs.

## 2014-10-22 NOTE — ED Notes (Signed)
Unable to collect labs because only wants to be stuck once when he gets his IV done.

## 2014-10-24 ENCOUNTER — Telehealth (HOSPITAL_COMMUNITY): Payer: Self-pay

## 2014-10-24 ENCOUNTER — Encounter (HOSPITAL_COMMUNITY): Payer: Self-pay

## 2014-10-24 ENCOUNTER — Emergency Department (HOSPITAL_COMMUNITY)
Admission: EM | Admit: 2014-10-24 | Discharge: 2014-10-25 | Disposition: A | Discharge status: Home / Self Care | Payer: Medicaid - Out of State | Source: Home / Self Care

## 2014-10-24 ENCOUNTER — Encounter (HOSPITAL_COMMUNITY): Payer: Self-pay | Admitting: Emergency Medicine

## 2014-10-24 ENCOUNTER — Emergency Department (HOSPITAL_COMMUNITY)
Admission: EM | Admit: 2014-10-24 | Discharge: 2014-10-24 | Disposition: A | Discharge status: Home / Self Care | Payer: Medicaid - Out of State | Attending: Emergency Medicine | Admitting: Emergency Medicine

## 2014-10-24 ENCOUNTER — Emergency Department (HOSPITAL_COMMUNITY)
Admission: EM | Admit: 2014-10-24 | Discharge: 2014-10-24 | Disposition: A | Discharge status: Home / Self Care | Payer: Medicaid - Out of State | Source: Home / Self Care | Attending: Emergency Medicine | Admitting: Emergency Medicine

## 2014-10-24 DIAGNOSIS — D5700 Hb-SS disease with crisis, unspecified: Secondary | ICD-10-CM

## 2014-10-24 DIAGNOSIS — Z79899 Other long term (current) drug therapy: Secondary | ICD-10-CM | POA: Insufficient documentation

## 2014-10-24 DIAGNOSIS — M549 Dorsalgia, unspecified: Secondary | ICD-10-CM

## 2014-10-24 DIAGNOSIS — Z720 Tobacco use: Secondary | ICD-10-CM | POA: Insufficient documentation

## 2014-10-24 DIAGNOSIS — M79606 Pain in leg, unspecified: Secondary | ICD-10-CM

## 2014-10-24 DIAGNOSIS — G8929 Other chronic pain: Secondary | ICD-10-CM

## 2014-10-24 DIAGNOSIS — R110 Nausea: Secondary | ICD-10-CM

## 2014-10-24 LAB — COMPREHENSIVE METABOLIC PANEL WITH GFR
ALT: 17 U/L (ref 17–63)
AST: 67 U/L — ABNORMAL HIGH (ref 15–41)
Albumin: 4.4 g/dL (ref 3.5–5.0)
Alkaline Phosphatase: 72 U/L (ref 38–126)
Anion gap: 9 (ref 5–15)
BUN: 7 mg/dL (ref 6–20)
CO2: 22 mmol/L (ref 22–32)
Calcium: 9.1 mg/dL (ref 8.9–10.3)
Chloride: 108 mmol/L (ref 101–111)
Creatinine, Ser: 0.38 mg/dL — ABNORMAL LOW (ref 0.61–1.24)
GFR calc Af Amer: >60 mL/min
GFR calc non Af Amer: >60 mL/min
Glucose, Bld: 72 mg/dL (ref 65–99)
Potassium: 4.8 mmol/L (ref 3.5–5.1)
Sodium: 139 mmol/L (ref 135–145)
Total Bilirubin: 4 mg/dL — ABNORMAL HIGH (ref 0.3–1.2)
Total Protein: 7.4 g/dL (ref 6.5–8.1)

## 2014-10-24 LAB — CBC WITH DIFFERENTIAL/PLATELET
Basophils Absolute: 0.2 K/uL — ABNORMAL HIGH (ref 0.0–0.1)
Basophils Relative: 1 %
Eosinophils Absolute: 0.9 K/uL — ABNORMAL HIGH (ref 0.0–0.7)
Eosinophils Relative: 6 %
HCT: 22.9 % — ABNORMAL LOW (ref 39.0–52.0)
Hemoglobin: 8.1 g/dL — ABNORMAL LOW (ref 13.0–17.0)
Lymphocytes Relative: 25 %
Lymphs Abs: 3.9 K/uL (ref 0.7–4.0)
MCH: 32.1 pg (ref 26.0–34.0)
MCHC: 35.4 g/dL (ref 30.0–36.0)
MCV: 90.9 fL (ref 78.0–100.0)
Monocytes Absolute: 1.6 K/uL — ABNORMAL HIGH (ref 0.1–1.0)
Monocytes Relative: 10 %
Neutro Abs: 8.9 K/uL — ABNORMAL HIGH (ref 1.7–7.7)
Neutrophils Relative %: 58 %
Platelets: ADEQUATE K/uL (ref 150–400)
RBC: 2.52 MIL/uL — ABNORMAL LOW (ref 4.22–5.81)
RDW: 21.4 % — ABNORMAL HIGH (ref 11.5–15.5)
WBC: 15.5 K/uL — ABNORMAL HIGH (ref 4.0–10.5)

## 2014-10-24 LAB — RETICULOCYTES
RBC.: 2.52 MIL/uL — ABNORMAL LOW (ref 4.22–5.81)
Retic Count, Absolute: 488.9 K/uL — ABNORMAL HIGH (ref 19.0–186.0)
Retic Ct Pct: 19.4 % — ABNORMAL HIGH (ref 0.4–3.1)

## 2014-10-24 MED ORDER — DIPHENHYDRAMINE HCL 25 MG PO CAPS
25.0000 mg | ORAL_CAPSULE | Freq: Once | ORAL | Status: AC
  Administered 2014-10-24: 25 mg via ORAL
  Filled 2014-10-24: qty 1

## 2014-10-24 MED ORDER — ONDANSETRON HCL 8 MG PO TABS: 8.0000 mg | ORAL_TABLET | Freq: Three times a day (TID) | ORAL | Status: DC | PRN

## 2014-10-24 MED ORDER — KETOROLAC TROMETHAMINE 60 MG/2ML IM SOLN
30.0000 mg | Freq: Once | INTRAMUSCULAR | Status: AC
  Administered 2014-10-24: 30 mg via INTRAMUSCULAR
  Filled 2014-10-24: qty 2

## 2014-10-24 MED ORDER — ONDANSETRON HCL 4 MG/2ML IJ SOLN
4.0000 mg | Freq: Once | INTRAMUSCULAR | Status: AC
  Administered 2014-10-24: 4 mg via INTRAVENOUS
  Filled 2014-10-24: qty 2

## 2014-10-24 MED ORDER — SODIUM CHLORIDE 0.9 % IV BOLUS (SEPSIS)
2000.0000 mL | Freq: Once | INTRAVENOUS | Status: AC
  Administered 2014-10-24: 2000 mL via INTRAVENOUS

## 2014-10-24 MED ORDER — ONDANSETRON 8 MG PO TBDP
8.0000 mg | ORAL_TABLET | Freq: Once | ORAL | Status: AC
  Administered 2014-10-24: 8 mg via ORAL
  Filled 2014-10-24: qty 1

## 2014-10-24 MED ORDER — HYDROMORPHONE HCL 2 MG/ML IJ SOLN
2.0000 mg | Freq: Once | INTRAMUSCULAR | Status: AC
  Administered 2014-10-24: 2 mg via INTRAMUSCULAR
  Filled 2014-10-24: qty 1

## 2014-10-24 MED ORDER — HYDROMORPHONE HCL 1 MG/ML IJ SOLN
1.0000 mg | Freq: Once | INTRAMUSCULAR | Status: AC
  Administered 2014-10-24: 1 mg via INTRAVENOUS
  Filled 2014-10-24: qty 1

## 2014-10-24 NOTE — ED Notes (Signed)
Unsuccessful IV attempt x 2, second RN to attempt 

## 2014-10-24 NOTE — ED Notes (Signed)
Pt states he is having leg and back pain due to his sickle cell  Pt has been seen here twice today for same  Pt states he talked to his doctor about an hour ago and was told to come back to the hospitall for pain control

## 2014-10-24 NOTE — ED Notes (Signed)
He c/o persistent pain in his back and bilat. Legs which he recognizes as continued sickle cell related pain. He is in no distress.

## 2014-10-24 NOTE — ED Notes (Signed)
Pt was seen at [facility] this morning and left prior to d/c around 1115.
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
## 2014-10-24 NOTE — ED Notes (Signed)
RN to bedside to discharge pt, pt not in room.  Will recheck in room and department.

## 2014-10-24 NOTE — ED Notes (Signed)
Pt is upset about discharge and wants to be admitted.  RN explained that sickle cell is a chronic condition and we will not be able to eliminate his pain in crisis, but he insists on admission.  PA notified.

## 2014-10-24 NOTE — Discharge Instructions (Signed)
Sickle Cell Anemia, Adult °Sickle cell anemia is a condition in which red blood cells have an abnormal "sickle" shape. This abnormal shape shortens the cells' life span, which results in a lower than normal concentration of red blood cells in the blood. The sickle shape also causes the cells to clump together and block free blood flow through the blood vessels. As a result, the tissues and organs of the body do not receive enough oxygen. Sickle cell anemia causes organ damage and pain and increases the risk of infection. °CAUSES  °Sickle cell anemia is a genetic disorder. Those who receive two copies of the gene have the condition, and those who receive one copy have the trait. °RISK FACTORS °The sickle cell gene is most common in people whose families originated in [place]. Other areas of the globe where sickle cell trait occurs include the Mediterranean, [place] and [place], the [place], and the [place].  °SIGNS AND SYMPTOMS °· Pain, especially in the extremities, back, chest, or abdomen (common). The pain may start suddenly or may develop following an illness, especially if there is dehydration. Pain can also occur due to overexertion or exposure to extreme temperature changes. °· Frequent severe bacterial infections, especially certain types of pneumonia and meningitis. °· Pain and swelling in the hands and feet. °· Decreased activity.   °· Loss of appetite.   °· Change in behavior. °· Headaches. °· Seizures. °· Shortness of breath or difficulty breathing. °· Vision changes. °· Skin ulcers. °Those with the trait may not have symptoms or they may have mild symptoms.  °DIAGNOSIS  °Sickle cell anemia is diagnosed with blood tests that demonstrate the genetic trait. It is often diagnosed during the newborn period, due to mandatory testing nationwide. A variety of blood tests, X-rays, CT scans, MRI scans, ultrasounds, and lung function tests may also be done to monitor the condition. °TREATMENT  °Sickle  cell anemia may be treated with: °· Medicines. You may be given pain medicines, antibiotic medicines (to treat and prevent infections) or medicines to increase the production of certain types of hemoglobin. °· Fluids. °· Oxygen. °· Blood transfusions. °HOME CARE INSTRUCTIONS  °· Drink enough fluid to keep your urine clear or pale yellow. Increase your fluid intake in hot weather and during exercise. °· Do not smoke. Smoking lowers oxygen levels in the blood.   °· Only take over-the-counter or prescription medicines for pain, fever, or discomfort as directed by your health care provider. °· Take antibiotics as directed by your health care provider. Make sure you finish them it even if you start to feel better.   °· Take supplements as directed by your health care provider.   °· Consider wearing a medical alert bracelet. This tells anyone caring for you in an emergency of your condition.   °· When traveling, keep your medical information, health care provider's names, and the medicines you take with you at all times.   °· If you develop a fever, do not take medicines to reduce the fever right away. This could cover up a problem that is developing. Notify your health care provider. °· Keep all follow-up appointments with your health care provider. Sickle cell anemia requires regular medical care. °SEEK MEDICAL CARE IF: ° You have a fever. °SEEK IMMEDIATE MEDICAL CARE IF:  °· You feel dizzy or faint.   °· You have new abdominal pain, especially on the left side near the stomach area.   °· You develop a persistent, often uncomfortable and painful penile erection (priapism). If this is not treated immediately it   will lead to impotence.   You have numbness your arms or legs or you have a hard time moving them.   You have a hard time with speech.   You have a fever or persistent symptoms for more than 2-3 days.   You have a fever and your symptoms suddenly get worse.   You have signs or symptoms of infection.  These include:   Chills.   Abnormal tiredness (lethargy).   Irritability.   Poor eating.   Vomiting.   You develop pain that is not helped with medicine.   You develop shortness of breath.  You have pain in your chest.   You are coughing up pus-like or bloody sputum.   You develop a stiff neck.  Your feet or hands swell or have pain.  Your abdomen appears bloated.  You develop joint pain. MAKE SURE YOU:  Understand these instructions. Document Released: 05/06/2005 Document Revised: 06/12/2013 Document Reviewed: 09/07/2012 [facility] Patient Information 2015 [place], [place]. This information is not intended to replace advice given to you by your health care provider. Make sure you discuss any questions you have with your health care provider.  Return to the emergency department if he experienced chest pain, difficulty breathing, cough, numbness or tingling in extremities, vomiting, diarrhea.

## 2014-10-24 NOTE — Discharge Instructions (Signed)
Take your home pain medications. Use zofran as directed. Stay well hydrated. Follow up with your regular doctor today for ongoing management of your chronic pain. Return to the ER for changes or worsening symptoms.   Chronic Back Pain  When back pain lasts longer than 3 months, it is called chronic back pain.People with chronic back pain often go through certain periods that are more intense (flare-ups).  CAUSES Chronic back pain can be caused by wear and tear (degeneration) on different structures in your back. These structures include:  The bones of your spine (vertebrae) and the joints surrounding your spinal cord and nerve roots (facets).  The strong, fibrous tissues that connect your vertebrae (ligaments). Degeneration of these structures may result in pressure on your nerves. This can lead to constant pain. HOME CARE INSTRUCTIONS  Avoid bending, heavy lifting, prolonged sitting, and activities which make the problem worse.  Take brief periods of rest throughout the day to reduce your pain. Lying down or standing usually is better than sitting while you are resting.  Take over-the-counter or prescription medicines only as directed by your caregiver. SEEK IMMEDIATE MEDICAL CARE IF:   You have weakness or numbness in one of your legs or feet.  You have trouble controlling your bladder or bowels.  You have nausea, vomiting, abdominal pain, shortness of breath, or fainting. Document Released: 03/05/2004 Document Revised: 04/20/2011 Document Reviewed: 01/10/2011 [facility] Patient Information 2015 [place], [place]. This information is not intended to replace advice given to you by your health care provider. Make sure you discuss any questions you have with your health care provider.  Musculoskeletal Pain Musculoskeletal pain is muscle and boney aches and pains. These pains can occur in any part of the body. Your caregiver may treat you without knowing the cause of the pain. They may treat  you if blood or urine tests, X-rays, and other tests were normal.  CAUSES There is often not a definite cause or reason for these pains. These pains may be caused by a type of germ (virus). The discomfort may also come from overuse. Overuse includes working out too hard when your body is not fit. Boney aches also come from weather changes. Bone is sensitive to atmospheric pressure changes. HOME CARE INSTRUCTIONS   Ask when your test results will be ready. Make sure you get your test results.  Only take over-the-counter or prescription medicines for pain, discomfort, or fever as directed by your caregiver. If you were given medications for your condition, do not drive, operate machinery or power tools, or sign legal documents for 24 hours. Do not drink alcohol. Do not take sleeping pills or other medications that may interfere with treatment.  Continue all activities unless the activities cause more pain. When the pain lessens, slowly resume normal activities. Gradually increase the intensity and duration of the activities or exercise.  During periods of severe pain, bed rest may be helpful. Lay or sit in any position that is comfortable.  Putting ice on the injured area.  Put ice in a bag.  Place a towel between your skin and the bag.  Leave the ice on for 15 to 20 minutes, 3 to 4 times a day.  Follow up with your caregiver for continued problems and no reason can be found for the pain. If the pain becomes worse or does not go away, it may be necessary to repeat tests or do additional testing. Your caregiver may need to look further for a possible cause. SEEK IMMEDIATE  MEDICAL CARE IF:  You have pain that is getting worse and is not relieved by medications.  You develop chest pain that is associated with shortness or breath, sweating, feeling sick to your stomach (nauseous), or throw up (vomit).  Your pain becomes localized to the abdomen.  You develop any new symptoms that seem  different or that concern you. MAKE SURE YOU:   Understand these instructions.  Will watch your condition.  Will get help right away if you are not doing well or get worse. Document Released: 01/26/2005 Document Revised: 04/20/2011 Document Reviewed: 09/30/2012 [facility] Patient Information 2015 [place], [place]. This information is not intended to replace advice given to you by your health care provider. Make sure you discuss any questions you have with your health care provider.  Nausea, Adult Nausea is the feeling that you have an upset stomach or have to vomit. Nausea by itself is not likely a serious concern, but it may be an early sign of more serious medical problems. As nausea gets worse, it can lead to vomiting. If vomiting develops, there is the risk of dehydration.  CAUSES   Viral infections.  Food poisoning.  Medicines.  Pregnancy.  Motion sickness.  Migraine headaches.  Emotional distress.  Severe pain from any source.  Alcohol intoxication. HOME CARE INSTRUCTIONS  Get plenty of rest.  Ask your caregiver about specific rehydration instructions.  Eat small amounts of food and sip liquids more often.  Take all medicines as told by your caregiver. SEEK MEDICAL CARE IF:  You have not improved after 2 days, or you get worse.  You have a headache. SEEK IMMEDIATE MEDICAL CARE IF:   You have a fever.  You faint.  You keep vomiting or have blood in your vomit.  You are extremely weak or dehydrated.  You have dark or bloody stools.  You have severe chest or abdominal pain. MAKE SURE YOU:  Understand these instructions.  Will watch your condition.  Will get help right away if you are not doing well or get worse. Document Released: 03/05/2004 Document Revised: 10/21/2011 Document Reviewed: 10/08/2010 [facility] Patient Information 2015 [place], [place]. This information is not intended to replace advice given to you by your health care provider. Make  sure you discuss any questions you have with your health care provider.  Sickle Cell Anemia, Adult Sickle cell anemia is a condition in which red blood cells have an abnormal "sickle" shape. This abnormal shape shortens the cells' life span, which results in a lower than normal concentration of red blood cells in the blood. The sickle shape also causes the cells to clump together and block free blood flow through the blood vessels. As a result, the tissues and organs of the body do not receive enough oxygen. Sickle cell anemia causes organ damage and pain and increases the risk of infection. CAUSES  Sickle cell anemia is a genetic disorder. Those who receive two copies of the gene have the condition, and those who receive one copy have the trait. RISK FACTORS The sickle cell gene is most common in people whose families originated in [place]. Other areas of the globe where sickle cell trait occurs include the [place], [place], the [place], and the [place].  SIGNS AND SYMPTOMS  Pain, especially in the extremities, back, chest, or abdomen (common). The pain may start suddenly or may develop following an illness, especially if there is dehydration. Pain can also occur due to overexertion or exposure to extreme temperature changes.  Frequent severe bacterial infections, especially certain types of pneumonia and meningitis.  Pain and swelling in the hands and feet.  Decreased activity.   Loss of appetite.   Change in behavior.  Headaches.  Seizures.  Shortness of breath or difficulty breathing.  Vision changes.  Skin ulcers. Those with the trait may not have symptoms or they may have mild symptoms.  DIAGNOSIS  Sickle cell anemia is diagnosed with blood tests that demonstrate the genetic trait. It is often diagnosed during the newborn period, due to mandatory testing nationwide. A variety of blood tests, X-rays, CT scans, MRI scans, ultrasounds, and lung  function tests may also be done to monitor the condition. TREATMENT  Sickle cell anemia may be treated with:  Medicines. You may be given pain medicines, antibiotic medicines (to treat and prevent infections) or medicines to increase the production of certain types of hemoglobin.  Fluids.  Oxygen.  Blood transfusions. HOME CARE INSTRUCTIONS   Drink enough fluid to keep your urine clear or pale yellow. Increase your fluid intake in hot weather and during exercise.  Do not smoke. Smoking lowers oxygen levels in the blood.   Only take over-the-counter or prescription medicines for pain, fever, or discomfort as directed by your health care provider.  Take antibiotics as directed by your health care provider. Make sure you finish them it even if you start to feel better.   Take supplements as directed by your health care provider.   Consider wearing a medical alert bracelet. This tells anyone caring for you in an emergency of your condition.   When traveling, keep your medical information, health care provider's names, and the medicines you take with you at all times.   If you develop a fever, do not take medicines to reduce the fever right away. This could cover up a problem that is developing. Notify your health care provider.  Keep all follow-up appointments with your health care provider. Sickle cell anemia requires regular medical care. SEEK MEDICAL CARE IF: You have a fever. SEEK IMMEDIATE MEDICAL CARE IF:   You feel dizzy or faint.   You have new abdominal pain, especially on the left side near the stomach area.   You develop a persistent, often uncomfortable and painful penile erection (priapism). If this is not treated immediately it will lead to impotence.   You have numbness your arms or legs or you have a hard time moving them.   You have a hard time with speech.   You have a fever or persistent symptoms for more than 2-3 days.   You have a fever and  your symptoms suddenly get worse.   You have signs or symptoms of infection. These include:   Chills.   Abnormal tiredness (lethargy).   Irritability.   Poor eating.   Vomiting.   You develop pain that is not helped with medicine.   You develop shortness of breath.  You have pain in your chest.   You are coughing up pus-like or bloody sputum.   You develop a stiff neck.  Your feet or hands swell or have pain.  Your abdomen appears bloated.  You develop joint pain. MAKE SURE YOU:  Understand these instructions. Document Released: 05/06/2005 Document Revised: 06/12/2013 Document Reviewed: 09/07/2012 [facility] Patient Information 2015 [place], [place]. This information is not intended to replace advice given to you by your health care provider. Make sure you discuss any questions you have with your health care provider.

## 2014-10-24 NOTE — Telephone Encounter (Signed)
Patient came to the day hospital wanting to be seen in the clinic. He is complaining of pain 8/10 in his back.  He stated that the last pain medication had been taken at 7am percocet . After speaking with provider, It was noted that Matthew Shannon has been seen and treated in the ER this morning. Patient advised to continue taking pain medications prescribed every 4 hours and to drink extra fluids to stay hydrated. If pain is not any better tomorrow morning, to call [facility] Patient verbalizes understanding.

## 2014-10-24 NOTE — ED Notes (Signed)
Pt requesting pain medication. Provider notified.

## 2014-10-24 NOTE — ED Notes (Signed)
Patient is requesting to wait until IV is put in to collect labs

## 2014-10-24 NOTE — ED Notes (Signed)
He states he is having back pain and bilat. L.e. Pain which he recognizes as his sickle cell pain.  He is in no distress.
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
## 2014-10-25 ENCOUNTER — Telehealth (HOSPITAL_COMMUNITY): Payer: Self-pay | Admitting: Hematology

## 2014-10-25 ENCOUNTER — Non-Acute Institutional Stay (HOSPITAL_COMMUNITY)
Admission: AD | Admit: 2014-10-25 | Discharge: 2014-10-25 | Disposition: A | Discharge status: Home / Self Care | Payer: Medicaid - Out of State | Attending: Internal Medicine | Admitting: Internal Medicine

## 2014-10-25 ENCOUNTER — Encounter (HOSPITAL_COMMUNITY): Payer: Self-pay | Admitting: *Deleted

## 2014-10-25 DIAGNOSIS — Z79891 Long term (current) use of opiate analgesic: Secondary | ICD-10-CM | POA: Diagnosis not present

## 2014-10-25 DIAGNOSIS — F17210 Nicotine dependence, cigarettes, uncomplicated: Secondary | ICD-10-CM | POA: Diagnosis not present

## 2014-10-25 DIAGNOSIS — Z79899 Other long term (current) drug therapy: Secondary | ICD-10-CM | POA: Diagnosis not present

## 2014-10-25 DIAGNOSIS — M545 Low back pain: Secondary | ICD-10-CM | POA: Diagnosis present

## 2014-10-25 DIAGNOSIS — D5700 Hb-SS disease with crisis, unspecified: Secondary | ICD-10-CM | POA: Diagnosis present

## 2014-10-25 LAB — LACTATE DEHYDROGENASE: LDH: 241 U/L — ABNORMAL HIGH (ref 98–192)

## 2014-10-25 MED ORDER — NALOXONE HCL 0.4 MG/ML IJ SOLN: 0.4000 mg | INTRAMUSCULAR | Status: DC | PRN

## 2014-10-25 MED ORDER — KETOROLAC TROMETHAMINE 30 MG/ML IJ SOLN
30.0000 mg | Freq: Once | INTRAMUSCULAR | Status: AC
  Administered 2014-10-25: 30 mg via INTRAVENOUS
  Filled 2014-10-25: qty 1

## 2014-10-25 MED ORDER — SODIUM CHLORIDE 0.9 % IV SOLN
12.5000 mg | Freq: Four times a day (QID) | INTRAVENOUS | Status: DC | PRN
  Filled 2014-10-25: qty 0.25

## 2014-10-25 MED ORDER — HYDROMORPHONE HCL 2 MG/ML IJ SOLN
2.0000 mg | Freq: Once | INTRAMUSCULAR | Status: AC
Start: 2014-10-25 — End: 2014-10-25
  Administered 2014-10-25: 2 mg via SUBCUTANEOUS
  Filled 2014-10-25: qty 1

## 2014-10-25 MED ORDER — HYDROMORPHONE 2 MG/ML HIGH CONCENTRATION IV PCA SOLN
INTRAVENOUS | Status: DC
  Administered 2014-10-25: 14:00:00 via INTRAVENOUS
  Administered 2014-10-25: 7.25 mg via INTRAVENOUS
  Filled 2014-10-25: qty 25

## 2014-10-25 MED ORDER — DIPHENHYDRAMINE HCL 12.5 MG/5ML PO ELIX: 12.5000 mg | ORAL_SOLUTION | Freq: Four times a day (QID) | ORAL | Status: DC | PRN

## 2014-10-25 MED ORDER — DEXTROSE-NACL 5-0.45 % IV SOLN
INTRAVENOUS | Status: DC
  Administered 2014-10-25: 14:00:00 via INTRAVENOUS

## 2014-10-25 MED ORDER — SODIUM CHLORIDE 0.9 % IJ SOLN: 9.0000 mL | INTRAMUSCULAR | Status: DC | PRN

## 2014-10-25 MED ORDER — OXYCODONE HCL 5 MG PO TABS
10.0000 mg | ORAL_TABLET | Freq: Once | ORAL | Status: AC
  Administered 2014-10-25: 10 mg via ORAL
  Filled 2014-10-25: qty 2

## 2014-10-25 MED ORDER — ONDANSETRON HCL 4 MG/2ML IJ SOLN: 4.0000 mg | Freq: Four times a day (QID) | INTRAMUSCULAR | Status: DC | PRN

## 2014-10-25 NOTE — ED Provider Notes (Signed)
Presents for his third ED visit today. Patient has received several doses of IV pain medication today and encouraged to resume home pain medication regimen which he has minimally attempted. Patient states he took 1 dose of his OxyContin at home and said that he waited 45 minutes and did not get full pain relief so he returned. Discussed with patient that multiple visits to the ED is not appropriate management. Labs and vital signs are stable. Educated patient on home pain management regimen however, unsure if patient understands. Patient is medically cleared.  Lester Kinsman [place], PA-C 10/25/14 0005  Lyndal Pulley, MD 10/25/14 [phone]

## 2014-10-25 NOTE — Progress Notes (Signed)
Patient removed his IV, and left [facility] prior to receiving and signing discharge paperwork.  Spoke with patient down in front lobby and he indicated that his brother is his ride and has to be at work by 5:30.  I advised the patient the importance of staying an additional 30 mins after discontinuing IV narcotics and receiving PO pain medication.  Patient verbalized understanding but insisted he could not wait any longer.  Elisha Headland, NP made aware.

## 2014-10-25 NOTE — Telephone Encounter (Signed)
Patient C/O pain to back and legs that is 8/10 on pain scale.  Patient states he took his last percocet around 0700, oxycontin around 0900.  Patient denies N/V/, abdominal pain, chest pain or shortness of breath.  Advised I would notify the physician and give him a call back.  Patient verbalizes understanding.

## 2014-10-25 NOTE — Progress Notes (Signed)
Pt stated his pain # was down to 2 after receiving Dilaudid pca and fluids. PCA and IV fluids were discontinued, but saline lock was left  in place. Po Oxycondone  given to patient as ordered and pt was advised that he has to stay for after receiving his po med. Pt asked if he could leave before then because his brother had to get to work. Pt was told that we would check and went back to his room and he was told he had to stay for the . RN left room to go to another room and pt removed his own IV and left the day hospital without acknowledgement from nurses or receiving discharge instructions. C.Hollis, NP notified

## 2014-10-25 NOTE — ED Notes (Signed)
PA in and talked with pt in consultation room

## 2014-10-25 NOTE — Progress Notes (Signed)
Unable to start IV on pt after several sticks. Pt states pain is 8 at this time. IV consult placed and order received to give SQ pain med. Will cont to monitor patient.

## 2014-10-25 NOTE — Discharge Summary (Signed)
[facility] Discharge Summary   Patient ID: Matthew Shannon MRN: [id number] DOB/AGE: Mar 02, 1990 24 y.o.  Admit date: 10/25/2014 Discharge date: 10/25/2014  Primary Care Physician:  Jeanann Lewandowsky, MD  Admission Diagnoses:  Active Problems:   Sickle cell pain crisis   Discharge Medications:    Medication List    TAKE these medications        folic acid 1 MG tablet  Commonly known as:  FOLVITE  Take 1 tablet (1 mg total) by mouth daily.     hydroxyurea 500 MG capsule  Commonly known as:  HYDREA  Take 1 capsule (500 mg total) by mouth daily. May take with food to minimize GI side effects.     morphine 30 MG 12 hr tablet  Commonly known as:  MS CONTIN  Take 1 tablet (30 mg total) by mouth every 12 (twelve) hours.     ondansetron 8 MG tablet  Commonly known as:  ZOFRAN  Take 1 tablet (8 mg total) by mouth every 8 (eight) hours as needed for nausea or vomiting.     oxyCODONE-acetaminophen 5-325 MG per tablet  Commonly known as:  PERCOCET/ROXICET  Take 2 tablets by mouth every 4 (four) hours as needed for severe pain.         Consults:  None  Significant Diagnostic Studies:  Dg Chest 2 View  10/06/2014   CLINICAL DATA:  Right-sided chest pain for 3 days. Current everyday smoker.  EXAM: CHEST  2 VIEW  COMPARISON:  09/25/2014  FINDINGS: Normal heart size and pulmonary vascularity. Linear interstitial changes in the lung bases are stable since prior study. This may represent fibrosis or chronic bronchitis. There is new patchy airspace infiltration in the left upper lung, suggesting developing acute pneumonia. No blunting of costophrenic angles. No pneumothorax. Thoracic scoliosis convex towards the right.  IMPRESSION: Unchanged interstitial pattern to the lungs. New focal airspace disease in the left upper lung suggesting acute pneumonia.   Electronically Signed   By: Burman Nieves M.D.   On: 10/06/2014 01:30   Dg Chest Port 1 View  09/25/2014   CLINICAL  DATA:  Sickle cell anemia with current hypoxic episode, patient smokes  EXAM: PORTABLE CHEST - 1 VIEW  COMPARISON:  09/24/2014  FINDINGS: Sigmoid scoliotic curvature of the thoracolumbar spine stable. Mild to moderate cardiac enlargement stable. Moderate vascular congestion. Mild diffuse interstitial process bilaterally, increased in conspicuity when compared to prior study. No pleural effusions.  IMPRESSION: Cardiac enlargement with vascular congestion stable from prior study. Mildly increased diffuse interstitial opacification. No Kerley B-lines suggesting facial pneumonia however. Findings may be due to sickle cell crisis. Other possibilities include atypical pneumonia or progressive interstitial lung disease.   Electronically Signed   By: Esperanza Heir M.D.   On: 09/25/2014 21:47     [facility] Course: [name], a 24 year old male was admitted to the day infusion center for extended relief. He was started on D5. 45 at 125 per hour for cellular rehydration. Patient was give Dilaudid 2 mg subcutaneously due to inability to obtain IV access. High concentration PCA was started for pain control, patient used a total of 7.25 mg with 25 demands and 12 deliveries. Mr. Farrel states that pain intensity is 2/10. Patient given Oxycodone 10 mg prior to discharge. Patient currently alert, oriented, and ambulatory. He will discharge home with his brother. He was advised to follow up in clinic as scheduled.  Physical Exam at Discharge:  BP 121/62  mmHg  Pulse 88  Temp(Src) 98.6 F (37 C) (Oral)  Resp 14  Ht  (1.778 m)  Wt 135 lb (61.236 kg)  BMI 19.37 kg/m2  SpO2 98%    Disposition at Discharge: 01-Home or Self Care  Discharge Orders:     Discharge Instructions    Discharge patient    Complete by:  As directed            Condition at Discharge:   Stable  Time spent on Discharge:  20 minutes  Signed: Serine Kea M 10/25/2014, 4:57 PM

## 2014-10-25 NOTE — Telephone Encounter (Signed)
Spoke with [place] Hollis, NP and it is ok for patient to come to [facility].  Patient verbalizes understanding.
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
## 2014-10-26 ENCOUNTER — Emergency Department (HOSPITAL_COMMUNITY)
Admission: EM | Admit: 2014-10-26 | Discharge: 2014-10-26 | Disposition: A | Discharge status: Home / Self Care | Payer: Medicaid - Out of State | Attending: Emergency Medicine | Admitting: Emergency Medicine

## 2014-10-26 ENCOUNTER — Encounter (HOSPITAL_COMMUNITY): Payer: Self-pay | Admitting: *Deleted

## 2014-10-26 DIAGNOSIS — Z79899 Other long term (current) drug therapy: Secondary | ICD-10-CM | POA: Diagnosis not present

## 2014-10-26 DIAGNOSIS — D5700 Hb-SS disease with crisis, unspecified: Secondary | ICD-10-CM | POA: Insufficient documentation

## 2014-10-26 DIAGNOSIS — Z720 Tobacco use: Secondary | ICD-10-CM | POA: Insufficient documentation

## 2014-10-26 DIAGNOSIS — Z87828 Personal history of other (healed) physical injury and trauma: Secondary | ICD-10-CM | POA: Diagnosis not present

## 2014-10-26 LAB — CBC WITH DIFFERENTIAL/PLATELET
BASOS ABS: 0.1 10*3/uL (ref 0.0–0.1)
Basophils Relative: 1 %
EOS ABS: 0.9 10*3/uL — AB (ref 0.0–0.7)
Eosinophils Relative: 6 %
HCT: 25.1 % — ABNORMAL LOW (ref 39.0–52.0)
Hemoglobin: 8.8 g/dL — ABNORMAL LOW (ref 13.0–17.0)
LYMPHS ABS: 3.4 10*3/uL (ref 0.7–4.0)
Lymphocytes Relative: 23 %
MCH: 33 pg (ref 26.0–34.0)
MCHC: 35.1 g/dL (ref 30.0–36.0)
MCV: 94 fL (ref 78.0–100.0)
Monocytes Absolute: 1.6 10*3/uL — ABNORMAL HIGH (ref 0.1–1.0)
Monocytes Relative: 11 %
NEUTROS ABS: 8.8 10*3/uL — AB (ref 1.7–7.7)
Neutrophils Relative %: 59 %
PLATELETS: 394 10*3/uL (ref 150–400)
RBC: 2.67 MIL/uL — ABNORMAL LOW (ref 4.22–5.81)
RDW: 20.8 % — AB (ref 11.5–15.5)
WBC: 14.8 10*3/uL — ABNORMAL HIGH (ref 4.0–10.5)

## 2014-10-26 LAB — COMPREHENSIVE METABOLIC PANEL
ALBUMIN: 4.5 g/dL (ref 3.5–5.0)
ALK PHOS: 80 U/L (ref 38–126)
ALT: 13 U/L — ABNORMAL LOW (ref 17–63)
ANION GAP: 9 (ref 5–15)
AST: 37 U/L (ref 15–41)
BILIRUBIN TOTAL: 4.2 mg/dL — AB (ref 0.3–1.2)
CALCIUM: 9.4 mg/dL (ref 8.9–10.3)
CO2: 24 mmol/L (ref 22–32)
Chloride: 107 mmol/L (ref 101–111)
Creatinine, Ser: 0.44 mg/dL — ABNORMAL LOW (ref 0.61–1.24)
GFR calc Af Amer: >60 mL/min (ref >60)
GLUCOSE: 112 mg/dL — AB (ref 65–99)
Potassium: 4.3 mmol/L (ref 3.5–5.1)
Sodium: 140 mmol/L (ref 135–145)
TOTAL PROTEIN: 7.9 g/dL (ref 6.5–8.1)

## 2014-10-26 LAB — RETICULOCYTES
RBC.: 2.67 MIL/uL — AB (ref 4.22–5.81)
RETIC CT PCT: 17.7 % — AB (ref 0.4–3.1)
Retic Count, Absolute: 472.6 10*3/uL — ABNORMAL HIGH (ref 19.0–186.0)

## 2014-10-26 MED ORDER — SODIUM CHLORIDE 0.9 % IV BOLUS (SEPSIS): 1000.0000 mL | Freq: Once | INTRAVENOUS | Status: DC

## 2014-10-26 MED ORDER — DIPHENHYDRAMINE HCL 25 MG PO CAPS
25.0000 mg | ORAL_CAPSULE | Freq: Once | ORAL | Status: AC
  Administered 2014-10-26: 25 mg via ORAL
  Filled 2014-10-26: qty 1

## 2014-10-26 MED ORDER — HYDROMORPHONE HCL 1 MG/ML IJ SOLN
1.0000 mg | Freq: Once | INTRAMUSCULAR | Status: AC
  Administered 2014-10-26: 1 mg via INTRAMUSCULAR

## 2014-10-26 MED ORDER — HYDROMORPHONE HCL 1 MG/ML IJ SOLN
1.0000 mg | Freq: Once | INTRAMUSCULAR | Status: DC
  Filled 2014-10-26: qty 1

## 2014-10-26 NOTE — Discharge Instructions (Signed)
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
## 2014-10-26 NOTE — ED Notes (Addendum)
Two unsuccessful IV attempts by this writer.  

## 2014-10-26 NOTE — ED Notes (Signed)
Pt reports sickle cell crisis x 4 days ago with back and bila leg pain.  Takes percocet for pain without relief at this time

## 2014-10-29 ENCOUNTER — Emergency Department (HOSPITAL_COMMUNITY)
Admission: EM | Admit: 2014-10-29 | Discharge: 2014-10-30 | Disposition: A | Discharge status: Home / Self Care | Payer: Medicaid - Out of State | Attending: Emergency Medicine | Admitting: Emergency Medicine

## 2014-10-29 ENCOUNTER — Telehealth (HOSPITAL_COMMUNITY): Payer: Self-pay

## 2014-10-29 ENCOUNTER — Telehealth: Payer: Self-pay | Admitting: Internal Medicine

## 2014-10-29 ENCOUNTER — Encounter (HOSPITAL_COMMUNITY): Payer: Self-pay | Admitting: *Deleted

## 2014-10-29 DIAGNOSIS — Z79899 Other long term (current) drug therapy: Secondary | ICD-10-CM | POA: Insufficient documentation

## 2014-10-29 DIAGNOSIS — D5700 Hb-SS disease with crisis, unspecified: Secondary | ICD-10-CM | POA: Insufficient documentation

## 2014-10-29 DIAGNOSIS — Z720 Tobacco use: Secondary | ICD-10-CM | POA: Diagnosis not present

## 2014-10-29 LAB — CBC WITH DIFFERENTIAL/PLATELET
BASOS ABS: 0.1 10*3/uL (ref 0.0–0.1)
BASOS PCT: 1 %
EOS ABS: 1 10*3/uL — AB (ref 0.0–0.7)
Eosinophils Relative: 7 %
HCT: 22.9 % — ABNORMAL LOW (ref 39.0–52.0)
HEMOGLOBIN: 8.2 g/dL — AB (ref 13.0–17.0)
LYMPHS ABS: 4.7 10*3/uL — AB (ref 0.7–4.0)
Lymphocytes Relative: 31 %
MCH: 32.7 pg (ref 26.0–34.0)
MCHC: 35.8 g/dL (ref 30.0–36.0)
MCV: 91.2 fL (ref 78.0–100.0)
Monocytes Absolute: 1.8 10*3/uL — ABNORMAL HIGH (ref 0.1–1.0)
Monocytes Relative: 12 %
NEUTROS PCT: 49 %
Neutro Abs: 7.7 10*3/uL (ref 1.7–7.7)
Platelets: 314 10*3/uL (ref 150–400)
RBC: 2.51 MIL/uL — AB (ref 4.22–5.81)
RDW: 20.1 % — ABNORMAL HIGH (ref 11.5–15.5)
WBC: 15.2 10*3/uL — AB (ref 4.0–10.5)

## 2014-10-29 LAB — COMPREHENSIVE METABOLIC PANEL
ALBUMIN: 4.6 g/dL (ref 3.5–5.0)
ALT: 8 U/L — AB (ref 17–63)
AST: 30 U/L (ref 15–41)
Alkaline Phosphatase: 81 U/L (ref 38–126)
Anion gap: 6 (ref 5–15)
BUN: 7 mg/dL (ref 6–20)
CHLORIDE: 108 mmol/L (ref 101–111)
CO2: 26 mmol/L (ref 22–32)
CREATININE: 0.34 mg/dL — AB (ref 0.61–1.24)
Calcium: 9 mg/dL (ref 8.9–10.3)
GFR calc Af Amer: >60 mL/min (ref >60)
GFR calc non Af Amer: >60 mL/min (ref >60)
GLUCOSE: 85 mg/dL (ref 65–99)
Potassium: 3.8 mmol/L (ref 3.5–5.1)
SODIUM: 140 mmol/L (ref 135–145)
Total Bilirubin: 5.3 mg/dL — ABNORMAL HIGH (ref 0.3–1.2)
Total Protein: 7.7 g/dL (ref 6.5–8.1)

## 2014-10-29 LAB — RETICULOCYTES
RBC.: 2.51 MIL/uL — ABNORMAL LOW (ref 4.22–5.81)
RETIC COUNT ABSOLUTE: 464.4 10*3/uL — AB (ref 19.0–186.0)
Retic Ct Pct: 18.5 % — ABNORMAL HIGH (ref 0.4–3.1)

## 2014-10-29 MED ORDER — HYDROMORPHONE HCL 2 MG/ML IJ SOLN
2.0000 mg | Freq: Once | INTRAMUSCULAR | Status: AC
  Administered 2014-10-29: 2 mg via INTRAVENOUS
  Filled 2014-10-29: qty 1

## 2014-10-29 MED ORDER — DIPHENHYDRAMINE HCL 50 MG/ML IJ SOLN
25.0000 mg | Freq: Once | INTRAMUSCULAR | Status: AC
  Administered 2014-10-29: 25 mg via INTRAVENOUS
  Filled 2014-10-29: qty 1

## 2014-10-29 MED ORDER — SODIUM CHLORIDE 0.9 % IV BOLUS (SEPSIS)
1000.0000 mL | Freq: Once | INTRAVENOUS | Status: AC
  Administered 2014-10-29: 1000 mL via INTRAVENOUS

## 2014-10-29 NOTE — Telephone Encounter (Signed)
Patient called complaining of pain all over 8/10. Wanting to come to the day hospital for treatment. I told patient per sickle cell protocol, that he could not be admitted to the day hospital for treatment after 1 pm. If pain persists he should go to the ED, and/ or  if no improvement by morning he could call clinic back in the morning. Patient verbalized understanding.
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
## 2014-10-29 NOTE — ED Notes (Signed)
Attempted IV without success. IV team to be consulted.

## 2014-10-29 NOTE — ED Notes (Signed)
Pt reports sickle cell crisis-reports back and bila leg pain.

## 2014-10-29 NOTE — ED Notes (Signed)
IV team at bedside 

## 2014-10-29 NOTE — ED Notes (Signed)
Unable to collect labs at this time patient wants labs gotten when he gets his IV

## 2014-10-30 ENCOUNTER — Inpatient Hospital Stay (EMERGENCY_DEPARTMENT_HOSPITAL)
Admission: EM | Admit: 2014-10-30 | Discharge: 2014-10-31 | Disposition: A | Discharge status: Home / Self Care | Payer: Medicaid - Out of State | Source: Home / Self Care | Attending: Internal Medicine | Admitting: Internal Medicine

## 2014-10-30 ENCOUNTER — Encounter (HOSPITAL_COMMUNITY): Payer: Self-pay

## 2014-10-30 DIAGNOSIS — Z720 Tobacco use: Secondary | ICD-10-CM | POA: Diagnosis present

## 2014-10-30 DIAGNOSIS — D5700 Hb-SS disease with crisis, unspecified: Secondary | ICD-10-CM | POA: Diagnosis present

## 2014-10-30 LAB — CBC WITH DIFFERENTIAL/PLATELET
BASOS ABS: 0.1 10*3/uL (ref 0.0–0.1)
Basophils Relative: 1 %
EOS ABS: 0.9 10*3/uL — AB (ref 0.0–0.7)
Eosinophils Relative: 6 %
HCT: 22.9 % — ABNORMAL LOW (ref 39.0–52.0)
HEMOGLOBIN: 7.9 g/dL — AB (ref 13.0–17.0)
LYMPHS ABS: 3.9 10*3/uL (ref 0.7–4.0)
LYMPHS PCT: 26 %
MCH: 31.9 pg (ref 26.0–34.0)
MCHC: 34.5 g/dL (ref 30.0–36.0)
MCV: 92.3 fL (ref 78.0–100.0)
Monocytes Absolute: 1.9 10*3/uL — ABNORMAL HIGH (ref 0.1–1.0)
Monocytes Relative: 13 %
NEUTROS PCT: 54 %
Neutro Abs: 8.4 10*3/uL — ABNORMAL HIGH (ref 1.7–7.7)
Platelets: 324 10*3/uL (ref 150–400)
RBC: 2.48 MIL/uL — AB (ref 4.22–5.81)
RDW: 20.7 % — ABNORMAL HIGH (ref 11.5–15.5)
WBC: 15.1 10*3/uL — AB (ref 4.0–10.5)

## 2014-10-30 LAB — COMPREHENSIVE METABOLIC PANEL
ALT: 14 U/L — ABNORMAL LOW (ref 17–63)
AST: 34 U/L (ref 15–41)
Albumin: 4.3 g/dL (ref 3.5–5.0)
Alkaline Phosphatase: 76 U/L (ref 38–126)
Anion gap: 5 (ref 5–15)
BUN: 6 mg/dL (ref 6–20)
CHLORIDE: 108 mmol/L (ref 101–111)
CO2: 27 mmol/L (ref 22–32)
Calcium: 9.4 mg/dL (ref 8.9–10.3)
Glucose, Bld: 88 mg/dL (ref 65–99)
POTASSIUM: 4 mmol/L (ref 3.5–5.1)
Sodium: 140 mmol/L (ref 135–145)
Total Bilirubin: 5.6 mg/dL — ABNORMAL HIGH (ref 0.3–1.2)
Total Protein: 7.6 g/dL (ref 6.5–8.1)

## 2014-10-30 LAB — RETICULOCYTES
RBC.: 2.5 MIL/uL — ABNORMAL LOW (ref 4.22–5.81)
RETIC COUNT ABSOLUTE: 495 10*3/uL — AB (ref 19.0–186.0)
RETIC CT PCT: 19.8 % — AB (ref 0.4–3.1)

## 2014-10-30 MED ORDER — HYDROMORPHONE HCL 2 MG/ML IJ SOLN
2.0000 mg | Freq: Once | INTRAMUSCULAR | Status: AC
Start: 2014-10-30 — End: 2014-10-30
  Administered 2014-10-30: 2 mg via INTRAVENOUS
  Filled 2014-10-30: qty 1

## 2014-10-30 MED ORDER — DIPHENHYDRAMINE HCL 50 MG/ML IJ SOLN
25.0000 mg | Freq: Once | INTRAMUSCULAR | Status: AC
  Administered 2014-10-30: 25 mg via INTRAVENOUS
  Filled 2014-10-30: qty 1

## 2014-10-30 MED ORDER — SODIUM CHLORIDE 0.9 % IV BOLUS (SEPSIS)
1000.0000 mL | Freq: Once | INTRAVENOUS | Status: AC
  Administered 2014-10-30: 1000 mL via INTRAVENOUS

## 2014-10-30 MED ORDER — ONDANSETRON HCL 4 MG/2ML IJ SOLN
4.0000 mg | Freq: Once | INTRAMUSCULAR | Status: AC
  Administered 2014-10-30: 4 mg via INTRAVENOUS
  Filled 2014-10-30: qty 2

## 2014-10-30 MED ORDER — HYDROMORPHONE HCL 2 MG/ML IJ SOLN
2.0000 mg | Freq: Once | INTRAMUSCULAR | Status: AC
  Administered 2014-10-30: 2 mg via INTRAVENOUS
  Filled 2014-10-30: qty 1

## 2014-10-30 NOTE — ED Notes (Signed)
Pt took out his own IV and left before getting discharge instructions.

## 2014-10-30 NOTE — ED Notes (Signed)
MADE FIRST CALL FOR BLOOD COLLECTION NO ONE ANSWERED

## 2014-10-30 NOTE — Discharge Instructions (Signed)

## 2014-10-30 NOTE — ED Notes (Signed)
Pt c/o back pain and BLE pain x 4 days which he attributes to Sickle Cell Crisis.  Pain score 8/10.  Pt reports that he was seen last night for same and sts "they wanted to keep me, but I had an issue w/ kids."  Sts he [facility], but sts "they didn't have enough beds."  Pt sts he has been taking all medications as prescribed.
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
## 2014-10-31 ENCOUNTER — Encounter (HOSPITAL_COMMUNITY): Payer: Self-pay | Admitting: *Deleted

## 2014-10-31 ENCOUNTER — Ambulatory Visit: Payer: Medicaid - Out of State | Admitting: Family Medicine

## 2014-10-31 ENCOUNTER — Telehealth: Payer: Self-pay | Admitting: *Deleted

## 2014-10-31 DIAGNOSIS — Z79899 Other long term (current) drug therapy: Secondary | ICD-10-CM

## 2014-10-31 DIAGNOSIS — D5700 Hb-SS disease with crisis, unspecified: Principal | ICD-10-CM

## 2014-10-31 DIAGNOSIS — Z720 Tobacco use: Secondary | ICD-10-CM | POA: Diagnosis not present

## 2014-10-31 LAB — COMPREHENSIVE METABOLIC PANEL
ALT: 13 U/L — ABNORMAL LOW (ref 17–63)
AST: 26 U/L (ref 15–41)
Albumin: 4 g/dL (ref 3.5–5.0)
Alkaline Phosphatase: 75 U/L (ref 38–126)
Anion gap: 15 (ref 5–15)
BILIRUBIN TOTAL: 5.5 mg/dL — AB (ref 0.3–1.2)
BUN: 5 mg/dL — AB (ref 6–20)
CHLORIDE: 99 mmol/L — AB (ref 101–111)
CO2: 26 mmol/L (ref 22–32)
Calcium: 8.3 mg/dL — ABNORMAL LOW (ref 8.9–10.3)
Creatinine, Ser: 0.37 mg/dL — ABNORMAL LOW (ref 0.61–1.24)
Glucose, Bld: 86 mg/dL (ref 65–99)
POTASSIUM: 3.5 mmol/L (ref 3.5–5.1)
Sodium: 140 mmol/L (ref 135–145)
TOTAL PROTEIN: 7.2 g/dL (ref 6.5–8.1)

## 2014-10-31 LAB — CBC WITH DIFFERENTIAL/PLATELET
BASOS ABS: 0.1 10*3/uL (ref 0.0–0.1)
BASOS PCT: 1 %
EOS ABS: 0.9 10*3/uL — AB (ref 0.0–0.7)
Eosinophils Relative: 6 %
HCT: 21.1 % — ABNORMAL LOW (ref 39.0–52.0)
HEMOGLOBIN: 7.4 g/dL — AB (ref 13.0–17.0)
Lymphocytes Relative: 32 %
Lymphs Abs: 5 10*3/uL — ABNORMAL HIGH (ref 0.7–4.0)
MCH: 32.3 pg (ref 26.0–34.0)
MCHC: 35.1 g/dL (ref 30.0–36.0)
MCV: 92.1 fL (ref 78.0–100.0)
MONO ABS: 1.6 10*3/uL — AB (ref 0.1–1.0)
MONOS PCT: 10 %
NEUTROS ABS: 8.1 10*3/uL — AB (ref 1.7–7.7)
NEUTROS PCT: 51 %
Platelets: 288 10*3/uL (ref 150–400)
RBC: 2.29 MIL/uL — ABNORMAL LOW (ref 4.22–5.81)
RDW: 20.4 % — AB (ref 11.5–15.5)
WBC: 15.6 10*3/uL — ABNORMAL HIGH (ref 4.0–10.5)

## 2014-10-31 LAB — RETICULOCYTES
RBC.: 2.29 MIL/uL — AB (ref 4.22–5.81)
RETIC COUNT ABSOLUTE: 460.3 10*3/uL — AB (ref 19.0–186.0)
RETIC CT PCT: 20.1 % — AB (ref 0.4–3.1)

## 2014-10-31 LAB — LACTATE DEHYDROGENASE: LDH: 243 U/L — AB (ref 98–192)

## 2014-10-31 LAB — MAGNESIUM: MAGNESIUM: 1.8 mg/dL (ref 1.7–2.4)

## 2014-10-31 MED ORDER — HYDROXYUREA 500 MG PO CAPS
500.0000 mg | ORAL_CAPSULE | Freq: Every day | ORAL | Status: DC
  Administered 2014-10-31: 500 mg via ORAL
  Filled 2014-10-31: qty 1

## 2014-10-31 MED ORDER — HEPARIN SODIUM (PORCINE) 5000 UNIT/ML IJ SOLN
5000.0000 [IU] | Freq: Three times a day (TID) | INTRAMUSCULAR | Status: DC
  Filled 2014-10-31 (×4): qty 1

## 2014-10-31 MED ORDER — OXYCODONE HCL 5 MG PO TABS
5.0000 mg | ORAL_TABLET | Freq: Once | ORAL | Status: AC
  Administered 2014-10-31: 5 mg via ORAL
  Filled 2014-10-31: qty 1

## 2014-10-31 MED ORDER — DIPHENHYDRAMINE HCL 12.5 MG/5ML PO ELIX: 12.5000 mg | ORAL_SOLUTION | Freq: Four times a day (QID) | ORAL | Status: DC | PRN

## 2014-10-31 MED ORDER — NALOXONE HCL 0.4 MG/ML IJ SOLN: 0.4000 mg | INTRAMUSCULAR | Status: DC | PRN

## 2014-10-31 MED ORDER — DIPHENHYDRAMINE HCL 50 MG/ML IJ SOLN
12.5000 mg | Freq: Four times a day (QID) | INTRAMUSCULAR | Status: DC | PRN
  Administered 2014-10-31: 12.5 mg via INTRAVENOUS
  Filled 2014-10-31: qty 1

## 2014-10-31 MED ORDER — OXYCODONE-ACETAMINOPHEN 10-325 MG PO TABS: 1.0000 | ORAL_TABLET | ORAL | Status: DC | PRN

## 2014-10-31 MED ORDER — DIPHENHYDRAMINE HCL 50 MG/ML IJ SOLN
25.0000 mg | Freq: Once | INTRAMUSCULAR | Status: AC
  Administered 2014-10-31: 25 mg via INTRAVENOUS
  Filled 2014-10-31: qty 1

## 2014-10-31 MED ORDER — SODIUM CHLORIDE 0.9 % IJ SOLN: 9.0000 mL | INTRAMUSCULAR | Status: DC | PRN

## 2014-10-31 MED ORDER — HYDROMORPHONE HCL 2 MG/ML IJ SOLN
2.0000 mg | Freq: Once | INTRAMUSCULAR | Status: AC
  Administered 2014-10-31: 2 mg via INTRAVENOUS
  Filled 2014-10-31: qty 1

## 2014-10-31 MED ORDER — FOLIC ACID 1 MG PO TABS
1.0000 mg | ORAL_TABLET | Freq: Every day | ORAL | Status: DC
  Administered 2014-10-31: 1 mg via ORAL
  Filled 2014-10-31: qty 1

## 2014-10-31 MED ORDER — POLYETHYLENE GLYCOL 3350 17 G PO PACK: 17.0000 g | PACK | Freq: Every day | ORAL | Status: DC | PRN

## 2014-10-31 MED ORDER — HYDROMORPHONE 0.3 MG/ML IV SOLN
INTRAVENOUS | Status: DC
  Administered 2014-10-31 (×2): via INTRAVENOUS
  Administered 2014-10-31: 0.999 mg via INTRAVENOUS
  Filled 2014-10-31 (×2): qty 25

## 2014-10-31 MED ORDER — DEXTROSE-NACL 5-0.45 % IV SOLN
INTRAVENOUS | Status: DC
  Administered 2014-10-31: 02:00:00 via INTRAVENOUS

## 2014-10-31 MED ORDER — SENNOSIDES-DOCUSATE SODIUM 8.6-50 MG PO TABS
1.0000 | ORAL_TABLET | Freq: Two times a day (BID) | ORAL | Status: DC
  Administered 2014-10-31: 1 via ORAL
  Filled 2014-10-31 (×2): qty 1

## 2014-10-31 MED ORDER — OXYCODONE-ACETAMINOPHEN 5-325 MG PO TABS
1.0000 | ORAL_TABLET | Freq: Once | ORAL | Status: AC
  Administered 2014-10-31: 1 via ORAL
  Filled 2014-10-31: qty 1

## 2014-10-31 MED ORDER — PROMETHAZINE HCL 25 MG PO TABS
12.5000 mg | ORAL_TABLET | ORAL | Status: DC | PRN
Start: 2014-10-31 — End: 2014-10-31

## 2014-10-31 MED ORDER — ONDANSETRON HCL 4 MG/2ML IJ SOLN: 4.0000 mg | Freq: Four times a day (QID) | INTRAMUSCULAR | Status: DC | PRN

## 2014-10-31 MED ORDER — PROMETHAZINE HCL 25 MG RE SUPP: 12.5000 mg | RECTAL | Status: DC | PRN

## 2014-10-31 NOTE — Telephone Encounter (Signed)
Patient is requesting a refill of percocet LOV 10/16/2014

## 2014-10-31 NOTE — Discharge Summary (Signed)
Matthew Shannon MRN: [id number] DOB/AGE: 13-Dec-1990 24 y.o.  Admit date: 10/30/2014 Discharge date: 10/31/2014  Primary Care Physician:  Jeanann Lewandowsky, MD   Discharge Diagnoses:   Patient Active Problem List   Diagnosis Date Noted  . Anemia 10/09/2014  . Sickle cell crisis 10/09/2014  . Tachycardia 09/26/2014  . Sickle cell pain crisis 09/24/2014  . Community acquired pneumonia 09/24/2014  . Tobacco abuse 09/24/2014  . CAP (community acquired pneumonia) 09/24/2014    DISCHARGE MEDICATION:   Medication List    STOP taking these medications        oxyCODONE-acetaminophen 5-325 MG per tablet  Commonly known as:  PERCOCET/ROXICET  Replaced by:  oxyCODONE-acetaminophen 10-325 MG per tablet      TAKE these medications        folic acid 1 MG tablet  Commonly known as:  FOLVITE  Take 1 tablet (1 mg total) by mouth daily.     hydroxyurea 500 MG capsule  Commonly known as:  HYDREA  Take 1 capsule (500 mg total) by mouth daily. May take with food to minimize GI side effects.     morphine 30 MG 12 hr tablet  Commonly known as:  MS CONTIN  Take 1 tablet (30 mg total) by mouth every 12 (twelve) hours.     ondansetron 8 MG tablet  Commonly known as:  ZOFRAN  Take 1 tablet (8 mg total) by mouth every 8 (eight) hours as needed for nausea or vomiting.     oxyCODONE-acetaminophen 10-325 MG per tablet  Commonly known as:  PERCOCET  Take 1 tablet by mouth every 4 (four) hours as needed for pain.          Consults:     SIGNIFICANT DIAGNOSTIC STUDIES:  Dg Chest 2 View  10/06/2014   CLINICAL DATA:  Right-sided chest pain for 3 days. Current everyday smoker.  EXAM: CHEST  2 VIEW  COMPARISON:  09/25/2014  FINDINGS: Normal heart size and pulmonary vascularity. Linear interstitial changes in the lung bases are stable since prior study. This may represent fibrosis or chronic bronchitis. There is new patchy airspace infiltration in the left upper lung, suggesting developing acute  pneumonia. No blunting of costophrenic angles. No pneumothorax. Thoracic scoliosis convex towards the right.  IMPRESSION: Unchanged interstitial pattern to the lungs. New focal airspace disease in the left upper lung suggesting acute pneumonia.   Electronically Signed   By: Burman Nieves M.D.   On: 10/06/2014 01:30     No results found for this or any previous visit (from the past 240 hour(s)).  BRIEF ADMITTING H & P: Pt with Hb SS known to me from previous hospitalizations. He presented with c/o pain typical of his sickle cell crisis. Pt reports that he was out of his medication due to financial constraints leading to an inability to obtain his full prescriptions.He was only able to obtain 3 days worth of medication and thus has been without any medications. HE presented to the ED with pain in the back and legs and after several doses of IV analgesics was still without relief from pain. Thus he was admitted for management of acute sickle cell crisis.    Hospital Course:  Present on Admission:  . Sickle cell pain crisis: Pt states that his pain is decreased since admission last night. He has used only 7.9 mg on the PCA. He was transitioned to  requested discharge home as he has 2 small children to care for without any assistance. He currently has no  medical risk and is ambulatory without assistance. He feels that he can manage at home if he has his oral analgesics.   .  Chronic Opiate Use: Pt reports that he has spoken with the staff at his PMD's office and they are requesting a visit with the clinic before he can have any further analgesics. I have reviewed the [facility] and spoken with the Pharmacy at [facility] and their reports are consistent with patient's report i.e.: He  Had an increase in MS Contin on 9/6 from 15 mg to 30 mg BID so he took 4 tabs/day instead of 2 tabs/day. When the MS Contin was completed he took the mew MS Contin prescription the Pharmacy but has not been able to pick  it up as it costs $75.00 and he could not afford. Similarly he was only able to afford to obtain 22 /60 tabs of Percocet 10-325 mg and thus also ran out of medications. He filled an older post-hospital prescription for Dilaudid on 9/16 (a Friday Evening) when his pain increased and he had no other analgesics. Her reports that he made the decision to fill the prescription rather than coming to the ED. I have discussed all of the above with [name] and he has requested that a prescription be written for Percocet 10-325 mg #60 tabs. I have also discussed with patient that he must see Dr. Foye Deer Extender in the clinic within 2 weeks in order to continue receiving opiate medications.  . Tobacco abuse: Pt cautioned against further tobacco use.   Disposition and Follow-up:  Pt discharged in good condition and is to schedule an appointment with [facility] in 1 week.      Discharge Instructions    Activity as tolerated - No restrictions    Complete by:  As directed      Diet general    Complete by:  As directed            DISCHARGE EXAM:  General: Alert, awake, oriented x3, in mild distress.  Vital Signs: BP 101/64, HR 72, T 98.3 F (36.8 C), temperature source Oral, RR 16, height  (1.753 m), weight 129 lb 3 oz (58.6 kg), SpO2 95 %. HEENT: [place]/AT PEERL, EOMI, mild icterus at baseline. OROPHARYNX: Moist, No exudate/ erythema/lesions.  Heart: Regular rate and rhythm, without murmurs, rubs, gallops or S3.  Pulmonary/Chest: Normal effort. Breath sounds normal. No. Apnea. Clear to auscultation,no stridor,  no wheezing and no rhonchi noted. No respiratory distress and no tenderness noted. Abdomen: Soft, nontender, nondistended, normal bowel sounds, no masses no hepatosplenomegaly noted. No fluid wave and no ascites. There is no guarding or rebound. Neuro: Alert and oriented to person, place and time. Normal motor skills, Displays no atrophy or tremors and exhibits normal  muscle tone.  No focal neurological deficits noted cranial nerves II through XII grossly intact. No sensory deficit noted. DTRs 2+ bilaterally upper and lower extremities. Strength at baseline in bilateral upper and lower extremities. Gait normal. Musculoskeletal: No warm swelling or erythema around joints, no spinal tenderness noted. Psychiatric: Patient alert and oriented x3, good insight and cognition, good recent to remote recall.Mood, memory, affect and judgement normal Skin: Skin is warm and dry. No bruising, no ecchymosis and no rash noted. Pt is not diaphoretic. No erythema. No pallor      Recent Labs  10/30/14 2105 10/31/14 0200  NA 140 140  K 4.0 3.5  CL 108 99*  CO2 27 26  GLUCOSE 88 86  BUN 6 5*  CREATININE <0.30* 0.37*  CALCIUM 9.4 8.3*  MG  --  1.8    Recent Labs  10/30/14 2105 10/31/14 0200  AST 34 26  ALT 14* 13*  ALKPHOS 76 75  BILITOT 5.6* 5.5*  PROT 7.6 7.2  ALBUMIN 4.3 4.0   No results for input(s): LIPASE, AMYLASE in the last 72 hours.  Recent Labs  10/30/14 2105 10/31/14 0200  WBC 15.1* 15.6*  NEUTROABS 8.4* 8.1*  HGB 7.9* 7.4*  HCT 22.9* 21.1*  MCV 92.3 92.1  PLT 324 288     Total time spent including face to face and decision making was greater than 30 minutes  Signed: MATTHEWS,[name] 10/31/2014, 4:42 PM

## 2014-10-31 NOTE — Progress Notes (Signed)
Patient given discharge, medication and f/u instructions, verbalized understanding, IV and telemetry removed, family to transport home. 

## 2014-10-31 NOTE — Care Management Note (Signed)
Case Management Note  Patient Details  Name: Vivian Okelley MRN: [id number] Date of Birth: [birth year]  Subjective/Objective:   24 y/o m admitted w/[facility].From home.                 Action/Plan:d/c home no needs or orders.   Expected Discharge Date:                  Expected Discharge Plan:  Home/Self Care  In-House Referral:     Discharge planning Services  CM Consult  Post Acute Care Choice:    Choice offered to:     DME Arranged:    DME Agency:     HH Arranged:    HH Agency:     Status of Service:  Completed, signed off  Medicare Important Message Given:    Date Medicare IM Given:    Medicare IM give by:    Date Additional Medicare IM Given:    Additional Medicare Important Message give by:     If discussed at Long Length of Stay Meetings, dates discussed:    Additional Comments:  Lanier Clam, RN 10/31/2014, 5:11 PM

## 2014-10-31 NOTE — Telephone Encounter (Signed)
Pt called and needs a refill of his Percocet. Please advise MD.Thanks

## 2014-10-31 NOTE — Progress Notes (Signed)
Paged attending md, Ashley Royalty, per pt request that he is requesting to be discharged

## 2014-10-31 NOTE — Telephone Encounter (Signed)
Patient did not show for last office visit and has had noted inconsistencies on [place] Substance Reporting System. Patient will have to schedule an office visit to discuss medications at length.  Massie Maroon, FNP
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
## 2014-11-04 ENCOUNTER — Emergency Department (HOSPITAL_COMMUNITY)
Admission: EM | Admit: 2014-11-04 | Discharge: 2014-11-04 | Discharge status: Against Medical Advice | Payer: Medicaid - Out of State | Attending: Emergency Medicine | Admitting: Emergency Medicine

## 2014-11-04 ENCOUNTER — Encounter (HOSPITAL_COMMUNITY): Payer: Self-pay | Admitting: Emergency Medicine

## 2014-11-04 DIAGNOSIS — Z720 Tobacco use: Secondary | ICD-10-CM | POA: Diagnosis not present

## 2014-11-04 DIAGNOSIS — D5700 Hb-SS disease with crisis, unspecified: Secondary | ICD-10-CM | POA: Diagnosis present

## 2014-11-04 DIAGNOSIS — Z79899 Other long term (current) drug therapy: Secondary | ICD-10-CM | POA: Diagnosis not present

## 2014-11-04 LAB — CBC WITH DIFFERENTIAL/PLATELET
Basophils Absolute: 0.1 10*3/uL (ref 0.0–0.1)
Basophils Relative: 0 %
Eosinophils Absolute: 0.9 10*3/uL — ABNORMAL HIGH (ref 0.0–0.7)
Eosinophils Relative: 6 %
HCT: 22.5 % — ABNORMAL LOW (ref 39.0–52.0)
HEMOGLOBIN: 7.9 g/dL — AB (ref 13.0–17.0)
LYMPHS ABS: 4.1 10*3/uL — AB (ref 0.7–4.0)
LYMPHS PCT: 28 %
MCH: 32 pg (ref 26.0–34.0)
MCHC: 35.1 g/dL (ref 30.0–36.0)
MCV: 91.1 fL (ref 78.0–100.0)
MONOS PCT: 12 %
Monocytes Absolute: 1.7 10*3/uL — ABNORMAL HIGH (ref 0.1–1.0)
NEUTROS PCT: 54 %
Neutro Abs: 7.8 10*3/uL — ABNORMAL HIGH (ref 1.7–7.7)
Platelets: 310 10*3/uL (ref 150–400)
RBC: 2.47 MIL/uL — AB (ref 4.22–5.81)
RDW: 18.8 % — ABNORMAL HIGH (ref 11.5–15.5)
WBC: 14.5 10*3/uL — AB (ref 4.0–10.5)

## 2014-11-04 LAB — COMPREHENSIVE METABOLIC PANEL
ALBUMIN: 4.7 g/dL (ref 3.5–5.0)
ALK PHOS: 78 U/L (ref 38–126)
ALT: 14 U/L — AB (ref 17–63)
AST: 27 U/L (ref 15–41)
Anion gap: 7 (ref 5–15)
BILIRUBIN TOTAL: 4.8 mg/dL — AB (ref 0.3–1.2)
CALCIUM: 9.2 mg/dL (ref 8.9–10.3)
CO2: 27 mmol/L (ref 22–32)
CREATININE: 0.44 mg/dL — AB (ref 0.61–1.24)
Chloride: 105 mmol/L (ref 101–111)
GFR calc Af Amer: >60 mL/min (ref >60)
GFR calc non Af Amer: >60 mL/min (ref >60)
GLUCOSE: 83 mg/dL (ref 65–99)
Potassium: 3.7 mmol/L (ref 3.5–5.1)
SODIUM: 139 mmol/L (ref 135–145)
Total Protein: 7.9 g/dL (ref 6.5–8.1)

## 2014-11-04 LAB — RETICULOCYTES
RBC.: 2.47 MIL/uL — ABNORMAL LOW (ref 4.22–5.81)
Retic Count, Absolute: 333.5 10*3/uL — ABNORMAL HIGH (ref 19.0–186.0)
Retic Ct Pct: 13.5 % — ABNORMAL HIGH (ref 0.4–3.1)

## 2014-11-04 MED ORDER — HYDROMORPHONE HCL 2 MG/ML IJ SOLN
2.5000 mg | Freq: Once | INTRAMUSCULAR | Status: AC
  Administered 2014-11-04: 2.5 mg via INTRAVENOUS
  Filled 2014-11-04: qty 2

## 2014-11-04 MED ORDER — HYDROMORPHONE HCL 2 MG/ML IJ SOLN: 0.0250 mg/kg | INTRAMUSCULAR | Status: DC

## 2014-11-04 MED ORDER — DIPHENHYDRAMINE HCL 50 MG/ML IJ SOLN
25.0000 mg | Freq: Once | INTRAMUSCULAR | Status: AC
  Administered 2014-11-04: 25 mg via INTRAVENOUS
  Filled 2014-11-04: qty 1

## 2014-11-04 MED ORDER — ONDANSETRON HCL 4 MG/2ML IJ SOLN
4.0000 mg | INTRAMUSCULAR | Status: DC | PRN
  Administered 2014-11-04: 4 mg via INTRAVENOUS
  Filled 2014-11-04: qty 2

## 2014-11-04 MED ORDER — SODIUM CHLORIDE 0.45 % IV SOLN
INTRAVENOUS | Status: DC
  Administered 2014-11-04: 13:00:00 via INTRAVENOUS

## 2014-11-04 NOTE — ED Notes (Signed)
Pt would like IV/labs done at the same time.  RN aware.

## 2014-11-04 NOTE — ED Notes (Signed)
MD at bedside. 

## 2014-11-04 NOTE — ED Notes (Signed)
Pt reports sickle cell crisis pain in back. Pt also reports he feels dehydrated. No emesis.

## 2014-11-04 NOTE — ED Notes (Signed)
He announces he received a call and has a family emergency and has to leave.  I d/c his IV and together we attempt to have him sign AMA in computer, however I was unable to get it to work.  He is calm and smiling and thanks [place] as he leaves.
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
## 2014-11-05 ENCOUNTER — Encounter (HOSPITAL_COMMUNITY): Payer: Self-pay

## 2014-11-05 ENCOUNTER — Telehealth (HOSPITAL_COMMUNITY): Payer: Self-pay | Admitting: Internal Medicine

## 2014-11-05 ENCOUNTER — Non-Acute Institutional Stay (HOSPITAL_COMMUNITY)
Admission: AD | Admit: 2014-11-05 | Discharge: 2014-11-05 | Disposition: A | Discharge status: Home / Self Care | Payer: Medicaid - Out of State | Attending: Internal Medicine | Admitting: Internal Medicine

## 2014-11-05 DIAGNOSIS — D5700 Hb-SS disease with crisis, unspecified: Secondary | ICD-10-CM | POA: Diagnosis present

## 2014-11-05 DIAGNOSIS — Z79891 Long term (current) use of opiate analgesic: Secondary | ICD-10-CM | POA: Diagnosis not present

## 2014-11-05 DIAGNOSIS — Z79899 Other long term (current) drug therapy: Secondary | ICD-10-CM | POA: Diagnosis not present

## 2014-11-05 DIAGNOSIS — F17210 Nicotine dependence, cigarettes, uncomplicated: Secondary | ICD-10-CM | POA: Insufficient documentation

## 2014-11-05 DIAGNOSIS — M545 Low back pain: Secondary | ICD-10-CM | POA: Diagnosis present

## 2014-11-05 MED ORDER — HYDROMORPHONE HCL 2 MG/ML IJ SOLN
2.0000 mg | Freq: Once | INTRAMUSCULAR | Status: AC
  Administered 2014-11-05: 2 mg via SUBCUTANEOUS
  Filled 2014-11-05: qty 1

## 2014-11-05 MED ORDER — OXYCODONE-ACETAMINOPHEN 5-325 MG PO TABS
1.0000 | ORAL_TABLET | Freq: Once | ORAL | Status: AC
  Administered 2014-11-05: 1 via ORAL
  Filled 2014-11-05: qty 1

## 2014-11-05 MED ORDER — ONDANSETRON HCL 4 MG/2ML IJ SOLN: 4.0000 mg | Freq: Four times a day (QID) | INTRAMUSCULAR | Status: DC | PRN

## 2014-11-05 MED ORDER — SODIUM CHLORIDE 0.9 % IV SOLN
12.5000 mg | Freq: Four times a day (QID) | INTRAVENOUS | Status: DC | PRN
  Filled 2014-11-05: qty 0.25

## 2014-11-05 MED ORDER — HYDROMORPHONE 2 MG/ML HIGH CONCENTRATION IV PCA SOLN
INTRAVENOUS | Status: DC
  Administered 2014-11-05: 11:00:00 via INTRAVENOUS
  Administered 2014-11-05: 8.4 mg via INTRAVENOUS
  Filled 2014-11-05: qty 25

## 2014-11-05 MED ORDER — OXYCODONE HCL 5 MG PO TABS
5.0000 mg | ORAL_TABLET | Freq: Once | ORAL | Status: AC
Start: 2014-11-05 — End: 2014-11-05
  Administered 2014-11-05: 5 mg via ORAL
  Filled 2014-11-05: qty 1

## 2014-11-05 MED ORDER — DIPHENHYDRAMINE HCL 12.5 MG/5ML PO ELIX: 12.5000 mg | ORAL_SOLUTION | Freq: Four times a day (QID) | ORAL | Status: DC | PRN

## 2014-11-05 MED ORDER — SODIUM CHLORIDE 0.9 % IJ SOLN: 9.0000 mL | INTRAMUSCULAR | Status: DC | PRN

## 2014-11-05 MED ORDER — NALOXONE HCL 0.4 MG/ML IJ SOLN: 0.4000 mg | INTRAMUSCULAR | Status: DC | PRN

## 2014-11-05 MED ORDER — KETOROLAC TROMETHAMINE 30 MG/ML IJ SOLN
30.0000 mg | Freq: Once | INTRAMUSCULAR | Status: AC
  Administered 2014-11-05: 30 mg via INTRAVENOUS
  Filled 2014-11-05: qty 1

## 2014-11-05 MED ORDER — DEXTROSE-NACL 5-0.45 % IV SOLN
INTRAVENOUS | Status: DC
  Administered 2014-11-05: 11:00:00 via INTRAVENOUS

## 2014-11-05 NOTE — Progress Notes (Signed)
Patient treated in the day hospital today. Rates pain a 4/10. Instruction given. Patient alert, oriented and ambulatory at discharge. States he has no other concerns at this time.

## 2014-11-05 NOTE — Discharge Summary (Signed)
[facility] Discharge Summary   Patient ID: Matthew Shannon MRN: [id number] DOB/AGE: 16-Mar-1990 24 y.o.  Admit date: 11/05/2014 Discharge date: 11/05/2014  Primary Care Physician:  Jeanann Lewandowsky, MD  Admission Diagnoses:  Active Problems:   Sickle cell pain crisis   Discharge Medications:    Medication List    ASK your doctor about these medications        folic acid 1 MG tablet  Commonly known as:  FOLVITE  Take 1 tablet (1 mg total) by mouth daily.     hydroxyurea 500 MG capsule  Commonly known as:  HYDREA  Take 1 capsule (500 mg total) by mouth daily. May take with food to minimize GI side effects.     morphine 30 MG 12 hr tablet  Commonly known as:  MS CONTIN  Take 1 tablet (30 mg total) by mouth every 12 (twelve) hours.     ondansetron 8 MG tablet  Commonly known as:  ZOFRAN  Take 1 tablet (8 mg total) by mouth every 8 (eight) hours as needed for nausea or vomiting.     oxyCODONE-acetaminophen 10-325 MG per tablet  Commonly known as:  PERCOCET  Take 1 tablet by mouth every 4 (four) hours as needed for pain.         Consults:  None  Significant Diagnostic Studies:  No results found.   [facility] Course: Matthew Shannon was admitted to the day infusion center for extended observation. He was started on a high concentration dilaudid PCA where he used a total of 8.4 mg with 21 demands and 14 deliveries. Pain intensity is 3/10, decreased from 9/10 on admission. Discussed the importance of follow-up appointment to discuss possibly increasing hydroxyurea therapy and referral to pain management.  Patient is alert, oriented and ambulatory. Will discharge home with brother in stable condition.  Physical Exam at Discharge:    BP 103/46 mmHg  Pulse 80  Temp(Src) 97.8 F (36.6 C) (Oral)  Resp 10  Ht  (1.778 m)  Wt 135 lb (61.236 kg)  BMI 19.37 kg/m2  SpO2 96%  PF   General Appearance:    Alert, cooperative, no distress,  appears stated age  Head:    Normocephalic, without obvious abnormality, atraumatic  Eyes:    PERRL, conjunctiva/corneas clear, EOM's intact, fundi    benign, both eyes       Ears:    Normal TM's and external ear canals, both ears  Nose:   Nares normal, septum midline, mucosa normal, no drainage   or sinus tenderness  Throat:   Lips, mucosa, and tongue normal; teeth and gums normal  Neck:   Supple, symmetrical, trachea midline, no adenopathy;       thyroid:  No enlargement/tenderness/nodules; no carotid   bruit or JVD  Back:     Symmetric, no curvature, ROM normal, no CVA tenderness  Lungs:     Clear to auscultation bilaterally, respirations unlabored  Chest wall:    No tenderness or deformity  Heart:    Regular rate and rhythm, S1 and S2 normal, no murmur, rub   or gallop     Extremities:   Extremities normal, atraumatic, no cyanosis or edema  Pulses:   2+ and symmetric all extremities  Skin:   Skin color, texture, turgor normal, no rashes or lesions  Lymph nodes:   Cervical, supraclavicular, and axillary nodes normal  Neurologic:   CNII-XII intact. Normal strength, sensation and reflexes      throughout  Disposition at Discharge: Stable  Discharge Orders:   Condition at Discharge:   Stable  Time spent on Discharge:  25 minutes  Signed: Akera Snowberger M 11/05/2014, 1:58 PM
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
## 2014-11-05 NOTE — Telephone Encounter (Signed)
Pt states experiencing back and leg pain ;states feels dehydrated; rates pain 8/10; states took home pain medications last night and none today; pt denies CP, SOB, priapism, vomiting, or diarrhea; states is experiencing nausea; NP notified; pt informed that he may come to the day hospital for evaluation; pt verbalizes understanding

## 2014-11-07 ENCOUNTER — Emergency Department (HOSPITAL_COMMUNITY)
Admission: EM | Admit: 2014-11-07 | Discharge: 2014-11-07 | Disposition: A | Discharge status: Home / Self Care | Payer: Medicaid - Out of State | Attending: Emergency Medicine | Admitting: Emergency Medicine

## 2014-11-07 ENCOUNTER — Encounter (HOSPITAL_COMMUNITY): Payer: Self-pay | Admitting: Emergency Medicine

## 2014-11-07 ENCOUNTER — Emergency Department (HOSPITAL_COMMUNITY): Payer: Medicaid - Out of State

## 2014-11-07 DIAGNOSIS — D5700 Hb-SS disease with crisis, unspecified: Secondary | ICD-10-CM | POA: Diagnosis present

## 2014-11-07 DIAGNOSIS — Z720 Tobacco use: Secondary | ICD-10-CM | POA: Insufficient documentation

## 2014-11-07 DIAGNOSIS — J159 Unspecified bacterial pneumonia: Secondary | ICD-10-CM | POA: Insufficient documentation

## 2014-11-07 DIAGNOSIS — Z79899 Other long term (current) drug therapy: Secondary | ICD-10-CM | POA: Diagnosis not present

## 2014-11-07 DIAGNOSIS — J189 Pneumonia, unspecified organism: Secondary | ICD-10-CM

## 2014-11-07 LAB — CBC WITH DIFFERENTIAL/PLATELET
BASOS ABS: 0.1 10*3/uL (ref 0.0–0.1)
BASOS PCT: 1 %
EOS ABS: 0.6 10*3/uL (ref 0.0–0.7)
EOS PCT: 4 %
HCT: 23.7 % — ABNORMAL LOW (ref 39.0–52.0)
Hemoglobin: 8.2 g/dL — ABNORMAL LOW (ref 13.0–17.0)
Lymphocytes Relative: 26 %
Lymphs Abs: 3.9 10*3/uL (ref 0.7–4.0)
MCH: 32.5 pg (ref 26.0–34.0)
MCHC: 34.6 g/dL (ref 30.0–36.0)
MCV: 94 fL (ref 78.0–100.0)
MONO ABS: 1.8 10*3/uL — AB (ref 0.1–1.0)
Monocytes Relative: 12 %
Neutro Abs: 8.5 10*3/uL — ABNORMAL HIGH (ref 1.7–7.7)
Neutrophils Relative %: 57 %
PLATELETS: 322 10*3/uL (ref 150–400)
RBC: 2.52 MIL/uL — ABNORMAL LOW (ref 4.22–5.81)
RDW: 19.2 % — AB (ref 11.5–15.5)
WBC: 14.8 10*3/uL — AB (ref 4.0–10.5)

## 2014-11-07 LAB — COMPREHENSIVE METABOLIC PANEL
ALBUMIN: 4.7 g/dL (ref 3.5–5.0)
ALT: 11 U/L — ABNORMAL LOW (ref 17–63)
AST: 26 U/L (ref 15–41)
Alkaline Phosphatase: 72 U/L (ref 38–126)
Anion gap: 8 (ref 5–15)
BUN: 7 mg/dL (ref 6–20)
CHLORIDE: 106 mmol/L (ref 101–111)
CO2: 27 mmol/L (ref 22–32)
Calcium: 9.8 mg/dL (ref 8.9–10.3)
Creatinine, Ser: 0.41 mg/dL — ABNORMAL LOW (ref 0.61–1.24)
GFR calc Af Amer: >60 mL/min (ref >60)
GLUCOSE: 107 mg/dL — AB (ref 65–99)
POTASSIUM: 4 mmol/L (ref 3.5–5.1)
SODIUM: 141 mmol/L (ref 135–145)
Total Bilirubin: 4.3 mg/dL — ABNORMAL HIGH (ref 0.3–1.2)
Total Protein: 8 g/dL (ref 6.5–8.1)

## 2014-11-07 MED ORDER — DIPHENHYDRAMINE HCL 50 MG/ML IJ SOLN
12.5000 mg | Freq: Once | INTRAMUSCULAR | Status: AC
  Administered 2014-11-07: 12.5 mg via INTRAVENOUS
  Filled 2014-11-07: qty 1

## 2014-11-07 MED ORDER — ONDANSETRON HCL 4 MG/2ML IJ SOLN
4.0000 mg | Freq: Once | INTRAMUSCULAR | Status: AC
  Administered 2014-11-07: 4 mg via INTRAVENOUS
  Filled 2014-11-07: qty 2

## 2014-11-07 MED ORDER — LEVOFLOXACIN 500 MG PO TABS: 500.0000 mg | ORAL_TABLET | Freq: Every day | ORAL | Status: DC

## 2014-11-07 MED ORDER — HYDROMORPHONE HCL 1 MG/ML IJ SOLN
1.0000 mg | Freq: Once | INTRAMUSCULAR | Status: AC
  Administered 2014-11-07: 1 mg via INTRAVENOUS
  Filled 2014-11-07: qty 1

## 2014-11-07 MED ORDER — OXYCODONE-ACETAMINOPHEN 5-325 MG PO TABS: 1.0000 | ORAL_TABLET | Freq: Four times a day (QID) | ORAL | Status: DC | PRN

## 2014-11-07 MED ORDER — KETOROLAC TROMETHAMINE 30 MG/ML IJ SOLN
30.0000 mg | Freq: Once | INTRAMUSCULAR | Status: AC
  Administered 2014-11-07: 30 mg via INTRAVENOUS
  Filled 2014-11-07: qty 1

## 2014-11-07 MED ORDER — SODIUM CHLORIDE 0.9 % IV BOLUS (SEPSIS)
1000.0000 mL | Freq: Once | INTRAVENOUS | Status: AC
  Administered 2014-11-07: 1000 mL via INTRAVENOUS

## 2014-11-07 MED ORDER — DIPHENHYDRAMINE HCL 50 MG/ML IJ SOLN
25.0000 mg | Freq: Once | INTRAMUSCULAR | Status: AC
  Administered 2014-11-07: 25 mg via INTRAVENOUS
  Filled 2014-11-07: qty 1

## 2014-11-07 NOTE — Discharge Instructions (Signed)
Follow up with your md next week. °
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
## 2014-11-07 NOTE — ED Notes (Signed)
Bed: [facility] Expected date:  Expected time:  Means of arrival:  Comments: Ems- sickle cell

## 2014-11-07 NOTE — ED Notes (Signed)
Pt arrived via EMS with report of having SSC with lower backd and bil leg pain that has gotten progressively worse over the past 3 days.

## 2014-11-08 ENCOUNTER — Encounter (HOSPITAL_COMMUNITY): Payer: Self-pay

## 2014-11-08 ENCOUNTER — Emergency Department (HOSPITAL_COMMUNITY)
Admission: EM | Admit: 2014-11-08 | Discharge: 2014-11-08 | Disposition: A | Discharge status: Home / Self Care | Payer: Medicaid - Out of State | Attending: Emergency Medicine | Admitting: Emergency Medicine

## 2014-11-08 DIAGNOSIS — Z720 Tobacco use: Secondary | ICD-10-CM | POA: Diagnosis not present

## 2014-11-08 DIAGNOSIS — Z79899 Other long term (current) drug therapy: Secondary | ICD-10-CM | POA: Diagnosis not present

## 2014-11-08 DIAGNOSIS — D5700 Hb-SS disease with crisis, unspecified: Secondary | ICD-10-CM | POA: Diagnosis present

## 2014-11-08 LAB — CBC WITH DIFFERENTIAL/PLATELET
Basophils Absolute: 0.1 10*3/uL (ref 0.0–0.1)
Basophils Relative: 1 %
Eosinophils Absolute: 0.8 10*3/uL — ABNORMAL HIGH (ref 0.0–0.7)
Eosinophils Relative: 5 %
HCT: 23.4 % — ABNORMAL LOW (ref 39.0–52.0)
Hemoglobin: 8.1 g/dL — ABNORMAL LOW (ref 13.0–17.0)
Lymphocytes Relative: 35 %
Lymphs Abs: 5.1 10*3/uL — ABNORMAL HIGH (ref 0.7–4.0)
MCH: 32.5 pg (ref 26.0–34.0)
MCHC: 34.6 g/dL (ref 30.0–36.0)
MCV: 94 fL (ref 78.0–100.0)
Monocytes Absolute: 1.7 10*3/uL — ABNORMAL HIGH (ref 0.1–1.0)
Monocytes Relative: 11 %
Neutro Abs: 7 10*3/uL (ref 1.7–7.7)
Neutrophils Relative %: 48 %
Platelets: 314 10*3/uL (ref 150–400)
RBC: 2.49 MIL/uL — ABNORMAL LOW (ref 4.22–5.81)
RDW: 19.3 % — ABNORMAL HIGH (ref 11.5–15.5)
WBC: 14.6 10*3/uL — ABNORMAL HIGH (ref 4.0–10.5)

## 2014-11-08 LAB — COMPREHENSIVE METABOLIC PANEL
ALT: 9 U/L — ABNORMAL LOW (ref 17–63)
AST: 19 U/L (ref 15–41)
Albumin: 4.7 g/dL (ref 3.5–5.0)
Alkaline Phosphatase: 77 U/L (ref 38–126)
Anion gap: 6 (ref 5–15)
BUN: 6 mg/dL (ref 6–20)
CO2: 25 mmol/L (ref 22–32)
Calcium: 9.4 mg/dL (ref 8.9–10.3)
Chloride: 109 mmol/L (ref 101–111)
Creatinine, Ser: 0.48 mg/dL — ABNORMAL LOW (ref 0.61–1.24)
GFR calc Af Amer: >60 mL/min (ref >60)
GFR calc non Af Amer: >60 mL/min (ref >60)
Glucose, Bld: 121 mg/dL — ABNORMAL HIGH (ref 65–99)
Potassium: 3.7 mmol/L (ref 3.5–5.1)
Sodium: 140 mmol/L (ref 135–145)
Total Bilirubin: 3.9 mg/dL — ABNORMAL HIGH (ref 0.3–1.2)
Total Protein: 8 g/dL (ref 6.5–8.1)

## 2014-11-08 LAB — RETICULOCYTES
RBC.: 2.51 MIL/uL — ABNORMAL LOW (ref 4.22–5.81)
Retic Count, Absolute: 318.8 10*3/uL — ABNORMAL HIGH (ref 19.0–186.0)
Retic Ct Pct: 12.7 % — ABNORMAL HIGH (ref 0.4–3.1)

## 2014-11-08 MED ORDER — HYDROMORPHONE HCL 2 MG/ML IJ SOLN
2.0000 mg | Freq: Once | INTRAMUSCULAR | Status: AC
  Administered 2014-11-08: 2 mg via INTRAVENOUS
  Filled 2014-11-08: qty 1

## 2014-11-08 MED ORDER — ONDANSETRON HCL 4 MG/2ML IJ SOLN
4.0000 mg | INTRAMUSCULAR | Status: AC
  Administered 2014-11-08: 4 mg via INTRAVENOUS
  Filled 2014-11-08: qty 2

## 2014-11-08 MED ORDER — OXYCODONE-ACETAMINOPHEN 5-325 MG PO TABS: 1.0000 | ORAL_TABLET | Freq: Four times a day (QID) | ORAL | Status: DC | PRN

## 2014-11-08 MED ORDER — SODIUM CHLORIDE 0.9 % IV BOLUS (SEPSIS)
1000.0000 mL | Freq: Once | INTRAVENOUS | Status: AC
  Administered 2014-11-08: 1000 mL via INTRAVENOUS

## 2014-11-08 MED ORDER — DIPHENHYDRAMINE HCL 50 MG/ML IJ SOLN
25.0000 mg | Freq: Once | INTRAMUSCULAR | Status: AC
  Administered 2014-11-08: 25 mg via INTRAVENOUS
  Filled 2014-11-08: qty 1

## 2014-11-08 MED ORDER — LEVOFLOXACIN 500 MG PO TABS: 500.0000 mg | ORAL_TABLET | Freq: Every day | ORAL | Status: DC

## 2014-11-08 MED ORDER — KETOROLAC TROMETHAMINE 30 MG/ML IJ SOLN
30.0000 mg | Freq: Once | INTRAMUSCULAR | Status: AC
  Administered 2014-11-08: 30 mg via INTRAVENOUS
  Filled 2014-11-08: qty 1

## 2014-11-08 NOTE — ED Notes (Signed)
Per EMS, Pt, from home, c/o generalized back pain and BLE pain r/t Sickle Cell Crisis.  Pain score 8/10.  Pt was seen yesterday for same.

## 2014-11-08 NOTE — ED Notes (Signed)
Attempted to call Pt for bed assignment w/o answer x 2.

## 2014-11-08 NOTE — ED Notes (Signed)
Pt presents with upper back pain, states seen here yesterday and dx with pneumonia. Pt reports his d/c paperwork said he was suppose to get abx prescription, but never received the prescription and was not able to fill the oxycodone because the rx was not signed.

## 2014-11-08 NOTE — Discharge Instructions (Signed)
1. Medications: levaquin, percocet, usual home medications 2. Treatment: rest, drink plenty of fluids 3. Follow Up: please followup with your primary doctor in 2-3 days for discussion of your diagnoses and further evaluation after today's visit; if you do not have a primary care doctor use the resource guide provided to find one; please return to the ER for fever, headache, loss of consciousness, shortness of breath, chest pain, new or worsening symptoms   Sickle Cell Anemia Sickle cell anemia is a condition where your red blood cells are shaped like sickles. Red blood cells carry oxygen through the body. Sickle-shaped red blood cells do not live as long as normal red blood cells. They also clump together and block blood from flowing through the blood vessels. These things prevent the body from getting enough oxygen. Sickle cell anemia causes organ damage and pain. It also increases the risk of infection. HOME CARE  Drink enough fluid to keep your pee (urine) clear or pale yellow. Drink more in hot weather and during exercise.  Do not smoke. Smoking lowers oxygen levels in the blood.  Only take over-the-counter or prescription medicines as told by your doctor.  Take antibiotic medicines as told by your doctor. Make sure you finish them even if you start to feel better.  Take supplements as told by your doctor.  Consider wearing a medical alert bracelet. This tells anyone caring for you in an emergency of your condition.  When traveling, keep your medical information, doctors' names, and the medicines you take with you at all times.  If you have a fever, do not take fever medicines right away. This could cover up a problem. Tell your doctor.   Keep all follow-up visits with your doctor. Sickle cell anemia requires regular medical care. GET HELP IF: You have a fever. GET HELP RIGHT AWAY IF:  You feel dizzy or faint.  You have new belly (abdominal) pain, especially on the left side near  the stomach area.  You have a lasting, often uncomfortable and painful erection of the penis (priapism). If it is not treated right away, you will become unable to have sex (impotence).  You have numbness in your arms or legs or you have a hard time moving them.  You have a hard time talking.  You have a fever or lasting symptoms for more than 2-3 days.  You have a fever and your symptoms suddenly get worse.  You have signs or symptoms of infection. These include:  Chills.  Being more tired than normal (lethargy).  Irritability.  Poor eating.  Throwing up (vomiting).  You have pain that is not helped with medicine.  You have shortness of breath.  You have pain in your chest.  You are coughing up pus-like or bloody mucus.  You have a stiff neck.  Your feet or hands swell or have pain.  Your belly looks bloated.  Your joints hurt. MAKE SURE YOU:  Understand these instructions.  Will watch your condition.  Will get help right away if you are not doing well or get worse. Document Released: 11/16/2012 Document Revised: 06/12/2013 Document Reviewed: 11/16/2012 [facility] Patient Information 2015 [place], [place]. This information is not intended to replace advice given to you by your health care provider. Make sure you discuss any questions you have with your health care provider.   Emergency Department Resource Guide 1) Find a Doctor and Pay Out of Pocket Although you won't have to find out who is covered by your insurance plan,  it is a good idea to ask around and get recommendations. You will then need to call the office and see if the doctor you have chosen will accept you as a new patient and what types of options they offer for patients who are self-pay. Some doctors offer discounts or will set up payment plans for their patients who do not have insurance, but you will need to ask so you aren't surprised when you get to your appointment.  2) Contact Your Local Health  Department Not all health departments have doctors that can see patients for sick visits, but many do, so it is worth a call to see if yours does. If you don't know where your local health department is, you can check in your phone book. The CDC also has a tool to help you locate your state's health department, and many state websites also have listings of all of their local health departments.  3) Find a Walk-in Clinic If your illness is not likely to be very severe or complicated, you may want to try a walk in clinic. These are popping up all over the country in pharmacies, drugstores, and shopping centers. They're usually staffed by nurse practitioners or physician assistants that have been trained to treat common illnesses and complaints. They're usually fairly quick and inexpensive. However, if you have serious medical issues or chronic medical problems, these are probably not your best option.  No Primary Care Doctor: - Call Health Connect at  [phone] - they can help you locate a primary care doctor that  accepts your insurance, provides certain services, etc. - Physician Referral Service- [phone]  Chronic Pain Problems: Organization         Address  Phone   Notes  [facility]  [phone] Patients need to be referred by their primary care doctor.   Medication Assistance: Organization         Address  Phone   Notes  [facility] Medication [facility] [address] [place], [place] [address] [phone] --Must be a resident of [facility] -- Must have NO insurance coverage whatsoever (no Medicaid/ Medicare, etc.) -- The pt. MUST have a primary care doctor that directs their care regularly and follows them in the community   [facility]  [phone]   [employer]  [phone]    Agencies that provide inexpensive medical care: Organization         Address  Phone   Notes  [facility]  [phone]   [facility]    [phone]   [facility] [address] [place], [place] [address] [phone]   [facility] [address], [place] [phone]   Planned Parenthood    [phone]   [facility]    [phone]   [facility]  [address], [place] Phone:  [phone], Fax:  [phone] Hours of Operation:  9 am - 6 pm, M-F.  Also accepts Medicaid/Medicare and self-pay.  [facility]  [address], [place] Phone: [phone], Fax: [phone]. Hours of Operation:  8:30 am - 5:30 pm, M-F.  Also accepts Medicaid and self-pay.  [facility] [place] [address] [place] Phone: [phone]   [facility] [address] Natasha Bence [place], [place] [phone], Ext. [phone] Mondays & Thursdays: 7-9  AM.  First 15 patients are seen on a first come, first serve basis.    Medicaid-accepting [facility] Providers:  Organization         Address  Phone   Notes  [facility] [address], [place] [phone] Also accepts self-pay patients.  [facility] [address] Laurell Josephs [place], [place]  [phone]   [facility] [address], [place] [phone]   [facility] [address], [place] [phone]   Renaye Rakers [address], [place]   [phone] Only accepts [place] Access [place] patients after they have their name applied to their card.   Self-Pay (no insurance) in [facility]  Organization         Address  Phone   Notes  Sickle Cell Patients, [facility] Internal Medicine [address] [place], [place] [phone]   [facility] [address] [place], [place] [phone]   [facility] [place]  [address] [place] [address],  [place] [phone]   [facility]/Dr. Osei-Bonsu  [address] [place] or [address], [place] [phone] Phone number for both [place] and [place] locations is the same.  [facility] [address], [place] [phone]   [facility] [address] [place] or [address] [phone]   [facility] [address] [place] [phone], phone; [phone], fax Sees patients 1st and 3rd Saturday of every month.  Must not qualify for public or private insurance (i.e. Medicaid, Medicare, [place] Health Choice, Veterans' Benefits)  Household income should be no more than 200% of the poverty level The clinic cannot treat you if you are pregnant or think you are pregnant  Sexually transmitted diseases are not treated at the clinic.    Dental Care: Organization         Address  Phone  Notes  [facility] Department of [facility] [address] [place], [place] [phone] Accepts children up to age 23 who are enrolled in [place] or [place] Health Choice; pregnant women with a Medicaid card; and children who have applied for Medicaid or [place] Health Choice, but were declined, whose parents can pay a reduced fee at time of service.  [facility] Department of [facility]  [address], [place] [phone] Accepts children up to age 42 who are enrolled in [place] or [place] Health Choice; pregnant women with a Medicaid card; and children who have applied for Medicaid or [place] Health Choice, but were declined, whose parents can pay a reduced fee at time of service.  [facility]  [address] [place], [place] [phone] Patients are seen by appointment only. Walk-ins are not accepted. [facility] will see patients 90 years of age and older. Monday - Tuesday (8am-5pm) Most Wednesdays  (8:30-5pm) $30 per visit, cash only  [facility] Adult Dental Access PROGRAM  [address], [facility] [phone] Patients are seen by appointment only. Walk-ins are not accepted. [facility] will see patients 80 years of age and older. One Wednesday Evening (Monthly: Volunteer Based).  $30 per visit, cash only  [employer]  [phone] for adults; Children under age 46,  call Graduate Pediatric Dentistry at [phone]. Children aged 32-14, please call [phone] to request a pediatric application.  Dental services are provided in all areas of dental care including fillings, crowns and bridges, complete and partial dentures, implants, gum treatment, root canals, and extractions. Preventive care is also provided. Treatment is provided to both adults and children. Patients are selected via a lottery and there is often a waiting list.   [facility] [address] [place]  [phone] [email or web address]   [facility] [address] [place], [place] [phone], Ext. [phone] Second and Fourth Thursday of each month, opens at 6:30 AM; Clinic ends at 9 AM.  Patients are seen on a first-come first-served basis, and a limited number are seen during each clinic.   [facility]  [address] Ether Griffins [place], [place] [phone]   Eligibility Requirements You must have lived in [place], [place], or [place] counties for at least the last three months.   You cannot be eligible for state or federal sponsored [place], including [employer], [place], or [employer].   You generally cannot be eligible for healthcare insurance through your employer.    How to apply: Eligibility screenings are held every Tuesday and Wednesday afternoon from 1:00 pm until 4:00 pm. You do not need an appointment for the interview!  [facility] [address], [place], [place] [phone]   [facility]  Health Department  [phone]   [facility] Health Department  [phone]   [facility] Health Department  [phone]    Behavioral Health Resources in the Community: Intensive Outpatient Programs Organization         Address  Phone  Notes  [facility] Services [address], [place], [place] [phone]   [facility] Outpatient [address], [place], [place] [phone]   [facility] [address] [place], [place]  [phone]   [facility] Mental Health [address]  [place], [place] [phone] or [phone]   Substance Abuse Resources Organization         Address  Phone  Notes  [facility]  [phone]   [facility]  [phone]   [facility] [place]  [phone]   Floydene Flock  [phone]   Residential & Outpatient Substance Abuse Program  [phone]   Psychological Services Organization         Address  Phone  Notes  [facility] Behavioral Health  [phone]   [facility] Services  [phone]   [facility] Mental Health [address], [place] [phone] or [phone]    Mobile Crisis Teams Organization         Address  Phone  Notes  [facility], Mobile Crisis Care Unit  [phone]   [facility]  [address] [place], [place] [phone]   Doristine Locks [address] [place] [phone]    Self-Help/Support Groups Organization         Address  Phone             Notes  [facility] [place] - variety of support groups  336- I7437963 Call for more information  Narcotics Anonymous (NA), [facility] [address], [employer] [place]  2 meetings at this location   Statistician         Address  Phone  Notes  [facility] [address],  [place]  [phone]   [facility]  [address] [place] [address], [place], [place]  [phone]   [facility] [address] [place], [place] [phone] Admissions: 8am-3pm M-F  [facility] [address]    [place], [place] [phone]   [facility] [address] [place], [place], [place] [phone]   [facility] [address],  [place], [place] [phone]   [facility] - Intensive Outpatient [address] Laurell Josephs [address], [place], [place] [phone]   [facility] ([facility]iction [facility] [address] [place]  [place], [place] [phone] or [phone]   [facility] ([facility]) [address] [place], [place] [phone] Accepts Medicaid  [facility] [address],  [place] [phone] Substance Abuse/Addiction Treatment   [facility] Organization         Address  Phone  Notes  [facility]  [phone]   Angie Fava, PhD [address] Ervin Knack [place], [place]   [phone] or [phone]   [facility] Behavioral   [address] [place], [place] ([phone]   [facility] [address] [place], [place] [phone] Insurance/Medicaid/sponsorship through [facility] and Families [address]                                    [place], [place] [phone] Therapy/tele-psych/case  [facility] [address], [place] [phone]    Dr. Lolly Mustache  [phone]   [facility] [place]  [employer] [facility] Dept. 1) [address] [place] 2) [address] [place] 3)  [address], [place] [phone]  [phone]   [facility] Child Abuse Hotline 940-547-9892 or [phone] (After Hours)

## 2014-11-08 NOTE — ED Notes (Signed)
PA at bedside.
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
## 2014-11-08 NOTE — ED Notes (Signed)
Called pt for blood draw, no answer. 

## 2014-11-09 ENCOUNTER — Encounter (HOSPITAL_COMMUNITY): Payer: Self-pay | Admitting: Emergency Medicine

## 2014-11-09 ENCOUNTER — Emergency Department (HOSPITAL_COMMUNITY)
Admission: EM | Admit: 2014-11-09 | Discharge: 2014-11-09 | Disposition: A | Discharge status: Home / Self Care | Payer: Medicaid - Out of State | Attending: Emergency Medicine | Admitting: Emergency Medicine

## 2014-11-09 DIAGNOSIS — Z79899 Other long term (current) drug therapy: Secondary | ICD-10-CM | POA: Diagnosis not present

## 2014-11-09 DIAGNOSIS — Z720 Tobacco use: Secondary | ICD-10-CM | POA: Insufficient documentation

## 2014-11-09 DIAGNOSIS — D5700 Hb-SS disease with crisis, unspecified: Secondary | ICD-10-CM | POA: Diagnosis present

## 2014-11-09 DIAGNOSIS — G8929 Other chronic pain: Secondary | ICD-10-CM | POA: Insufficient documentation

## 2014-11-09 MED ORDER — LEVOFLOXACIN 500 MG PO TABS
500.0000 mg | ORAL_TABLET | Freq: Once | ORAL | Status: AC
  Administered 2014-11-09: 500 mg via ORAL
  Filled 2014-11-09: qty 1

## 2014-11-09 MED ORDER — OXYCODONE-ACETAMINOPHEN 5-325 MG PO TABS
2.0000 | ORAL_TABLET | Freq: Once | ORAL | Status: AC
  Administered 2014-11-09: 2 via ORAL
  Filled 2014-11-09: qty 2

## 2014-11-09 MED ORDER — MORPHINE SULFATE ER 30 MG PO TBCR
30.0000 mg | EXTENDED_RELEASE_TABLET | Freq: Two times a day (BID) | ORAL | Status: DC
  Administered 2014-11-09: 30 mg via ORAL
  Filled 2014-11-09: qty 2

## 2014-11-09 NOTE — ED Notes (Signed)
The start of triage is not documented sequentially in this hx.  I had to go to flowsheets to enter the start of triage up to the vitals page.
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
## 2014-11-09 NOTE — ED Notes (Signed)
Pt is having sickle cell crisis, pain rating 8/10.  It started up 4-5 days ago and has steadily been getting worse until he came for tx.

## 2014-11-10 ENCOUNTER — Emergency Department (HOSPITAL_COMMUNITY): Payer: Medicaid - Out of State

## 2014-11-10 ENCOUNTER — Encounter (HOSPITAL_COMMUNITY): Payer: Self-pay | Admitting: Emergency Medicine

## 2014-11-10 ENCOUNTER — Emergency Department (HOSPITAL_COMMUNITY)
Admission: EM | Admit: 2014-11-10 | Discharge: 2014-11-10 | Disposition: A | Discharge status: Home / Self Care | Payer: Medicaid - Out of State | Attending: Emergency Medicine | Admitting: Emergency Medicine

## 2014-11-10 DIAGNOSIS — R531 Weakness: Secondary | ICD-10-CM | POA: Insufficient documentation

## 2014-11-10 DIAGNOSIS — Z79899 Other long term (current) drug therapy: Secondary | ICD-10-CM | POA: Insufficient documentation

## 2014-11-10 DIAGNOSIS — D5700 Hb-SS disease with crisis, unspecified: Secondary | ICD-10-CM | POA: Diagnosis present

## 2014-11-10 DIAGNOSIS — R0602 Shortness of breath: Secondary | ICD-10-CM | POA: Insufficient documentation

## 2014-11-10 DIAGNOSIS — G8929 Other chronic pain: Secondary | ICD-10-CM | POA: Insufficient documentation

## 2014-11-10 DIAGNOSIS — Z720 Tobacco use: Secondary | ICD-10-CM | POA: Diagnosis not present

## 2014-11-10 LAB — COMPREHENSIVE METABOLIC PANEL
ALBUMIN: 4.1 g/dL (ref 3.5–5.0)
ALT: 13 U/L — ABNORMAL LOW (ref 17–63)
ANION GAP: 7 (ref 5–15)
AST: 25 U/L (ref 15–41)
Alkaline Phosphatase: 72 U/L (ref 38–126)
BUN: 6 mg/dL (ref 6–20)
CHLORIDE: 106 mmol/L (ref 101–111)
CO2: 27 mmol/L (ref 22–32)
Calcium: 9.1 mg/dL (ref 8.9–10.3)
Creatinine, Ser: 0.48 mg/dL — ABNORMAL LOW (ref 0.61–1.24)
GFR calc Af Amer: >60 mL/min (ref >60)
GFR calc non Af Amer: >60 mL/min (ref >60)
GLUCOSE: 94 mg/dL (ref 65–99)
POTASSIUM: 3.3 mmol/L — AB (ref 3.5–5.1)
Sodium: 140 mmol/L (ref 135–145)
Total Bilirubin: 4.1 mg/dL — ABNORMAL HIGH (ref 0.3–1.2)
Total Protein: 7.1 g/dL (ref 6.5–8.1)

## 2014-11-10 LAB — CBC WITH DIFFERENTIAL/PLATELET
BASOS ABS: 0.2 10*3/uL — AB (ref 0.0–0.1)
Basophils Relative: 1 %
EOS ABS: 1 10*3/uL — AB (ref 0.0–0.7)
Eosinophils Relative: 6 %
HCT: 22.1 % — ABNORMAL LOW (ref 39.0–52.0)
HEMOGLOBIN: 7.8 g/dL — AB (ref 13.0–17.0)
Lymphocytes Relative: 31 %
Lymphs Abs: 5.1 10*3/uL — ABNORMAL HIGH (ref 0.7–4.0)
MCH: 32.4 pg (ref 26.0–34.0)
MCHC: 35.3 g/dL (ref 30.0–36.0)
MCV: 91.7 fL (ref 78.0–100.0)
MONO ABS: 1.8 10*3/uL — AB (ref 0.1–1.0)
MONOS PCT: 11 %
Neutro Abs: 8.4 10*3/uL — ABNORMAL HIGH (ref 1.7–7.7)
Neutrophils Relative %: 51 %
PLATELETS: 301 10*3/uL (ref 150–400)
RBC: 2.41 MIL/uL — AB (ref 4.22–5.81)
RDW: 19 % — AB (ref 11.5–15.5)
WBC: 16.5 10*3/uL — AB (ref 4.0–10.5)

## 2014-11-10 LAB — RETICULOCYTES
RBC.: 2.41 MIL/uL — AB (ref 4.22–5.81)
RETIC COUNT ABSOLUTE: 315.7 10*3/uL — AB (ref 19.0–186.0)
Retic Ct Pct: 13.1 % — ABNORMAL HIGH (ref 0.4–3.1)

## 2014-11-10 MED ORDER — SODIUM CHLORIDE 0.9 % IV SOLN: INTRAVENOUS | Status: DC

## 2014-11-10 MED ORDER — SODIUM CHLORIDE 0.9 % IV BOLUS (SEPSIS): 1000.0000 mL | Freq: Once | INTRAVENOUS | Status: DC

## 2014-11-10 MED ORDER — OXYCODONE-ACETAMINOPHEN 5-325 MG PO TABS
2.0000 | ORAL_TABLET | Freq: Once | ORAL | Status: AC
  Administered 2014-11-10: 2 via ORAL
  Filled 2014-11-10: qty 2

## 2014-11-10 NOTE — ED Notes (Signed)
Pt wants blood drawn when his IV is started

## 2014-11-10 NOTE — Discharge Instructions (Signed)
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
## 2014-11-10 NOTE — ED Notes (Signed)
Pt here for sickle cell related pain in his back and legs and generalized weakness. Pt states he was here yesterday for same symptoms.

## 2014-11-11 ENCOUNTER — Encounter (HOSPITAL_COMMUNITY): Payer: Self-pay | Admitting: *Deleted

## 2014-11-11 ENCOUNTER — Emergency Department (HOSPITAL_COMMUNITY)
Admission: EM | Admit: 2014-11-11 | Discharge: 2014-11-11 | Disposition: A | Discharge status: Home / Self Care | Payer: Medicaid - Out of State | Attending: Emergency Medicine | Admitting: Emergency Medicine

## 2014-11-11 ENCOUNTER — Encounter (HOSPITAL_COMMUNITY): Payer: Self-pay | Admitting: Emergency Medicine

## 2014-11-11 DIAGNOSIS — Z720 Tobacco use: Secondary | ICD-10-CM | POA: Diagnosis not present

## 2014-11-11 DIAGNOSIS — G8929 Other chronic pain: Secondary | ICD-10-CM | POA: Diagnosis not present

## 2014-11-11 DIAGNOSIS — D5700 Hb-SS disease with crisis, unspecified: Secondary | ICD-10-CM | POA: Diagnosis present

## 2014-11-11 DIAGNOSIS — Z79899 Other long term (current) drug therapy: Secondary | ICD-10-CM | POA: Insufficient documentation

## 2014-11-11 LAB — URINALYSIS, ROUTINE W REFLEX MICROSCOPIC
BILIRUBIN URINE: NEGATIVE
Glucose, UA: NEGATIVE mg/dL
Hgb urine dipstick: NEGATIVE
KETONES UR: NEGATIVE mg/dL
Leukocytes, UA: NEGATIVE
NITRITE: NEGATIVE
PROTEIN: NEGATIVE mg/dL
Specific Gravity, Urine: 1.011 (ref 1.005–1.030)
UROBILINOGEN UA: 2 mg/dL — AB (ref 0.0–1.0)
pH: 7.5 (ref 5.0–8.0)

## 2014-11-11 MED ORDER — OXYCODONE-ACETAMINOPHEN 5-325 MG PO TABS
2.0000 | ORAL_TABLET | Freq: Once | ORAL | Status: AC
  Administered 2014-11-11: 2 via ORAL
  Filled 2014-11-11: qty 2

## 2014-11-11 MED ORDER — SODIUM CHLORIDE 0.9 % IV BOLUS (SEPSIS)
1000.0000 mL | Freq: Once | INTRAVENOUS | Status: AC
  Administered 2014-11-11: 1000 mL via INTRAVENOUS

## 2014-11-11 NOTE — ED Notes (Signed)
Pt was at [facility] ED, left AMA, pt reports sickle cell pain crisis. Back pain and leg pain. Pain 10/10.

## 2014-11-11 NOTE — ED Notes (Signed)
When RN went to round on pt he had left room. MD made aware. Pt was up for discharge.

## 2014-11-11 NOTE — Discharge Instructions (Signed)
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
## 2014-11-11 NOTE — ED Notes (Signed)
Pt left without discharge instructions.

## 2014-11-11 NOTE — ED Notes (Signed)
MD aware of pt pain. VSS. Will continue to monitor
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
## 2014-11-11 NOTE — Discharge Instructions (Signed)
Sickle Cell Anemia, Adult °Sickle cell anemia is a condition in which red blood cells have an abnormal "sickle" shape. This abnormal shape shortens the cells' life span, which results in a lower than normal concentration of red blood cells in the blood. The sickle shape also causes the cells to clump together and block free blood flow through the blood vessels. As a result, the tissues and organs of the body do not receive enough oxygen. Sickle cell anemia causes organ damage and pain and increases the risk of infection. °CAUSES  °Sickle cell anemia is a genetic disorder. Those who receive two copies of the gene have the condition, and those who receive one copy have the trait. °RISK FACTORS °The sickle cell gene is most common in people whose families originated in [place]. Other areas of the globe where sickle cell trait occurs include the Mediterranean, [place] and [place], the [place], and the [place].  °SIGNS AND SYMPTOMS °· Pain, especially in the extremities, back, chest, or abdomen (common). The pain may start suddenly or may develop following an illness, especially if there is dehydration. Pain can also occur due to overexertion or exposure to extreme temperature changes. °· Frequent severe bacterial infections, especially certain types of pneumonia and meningitis. °· Pain and swelling in the hands and feet. °· Decreased activity.   °· Loss of appetite.   °· Change in behavior. °· Headaches. °· Seizures. °· Shortness of breath or difficulty breathing. °· Vision changes. °· Skin ulcers. °Those with the trait may not have symptoms or they may have mild symptoms.  °DIAGNOSIS  °Sickle cell anemia is diagnosed with blood tests that demonstrate the genetic trait. It is often diagnosed during the newborn period, due to mandatory testing nationwide. A variety of blood tests, X-rays, CT scans, MRI scans, ultrasounds, and lung function tests may also be done to monitor the condition. °TREATMENT  °Sickle  cell anemia may be treated with: °· Medicines. You may be given pain medicines, antibiotic medicines (to treat and prevent infections) or medicines to increase the production of certain types of hemoglobin. °· Fluids. °· Oxygen. °· Blood transfusions. °HOME CARE INSTRUCTIONS  °· Drink enough fluid to keep your urine clear or pale yellow. Increase your fluid intake in hot weather and during exercise. °· Do not smoke. Smoking lowers oxygen levels in the blood.   °· Only take over-the-counter or prescription medicines for pain, fever, or discomfort as directed by your health care provider. °· Take antibiotics as directed by your health care provider. Make sure you finish them it even if you start to feel better.   °· Take supplements as directed by your health care provider.   °· Consider wearing a medical alert bracelet. This tells anyone caring for you in an emergency of your condition.   °· When traveling, keep your medical information, health care provider's names, and the medicines you take with you at all times.   °· If you develop a fever, do not take medicines to reduce the fever right away. This could cover up a problem that is developing. Notify your health care provider. °· Keep all follow-up appointments with your health care provider. Sickle cell anemia requires regular medical care. °SEEK MEDICAL CARE IF: ° You have a fever. °SEEK IMMEDIATE MEDICAL CARE IF:  °· You feel dizzy or faint.   °· You have new abdominal pain, especially on the left side near the stomach area.   °· You develop a persistent, often uncomfortable and painful penile erection (priapism). If this is not treated immediately it   will lead to impotence.   °· You have numbness your arms or legs or you have a hard time moving them.   °· You have a hard time with speech.   °· You have a fever or persistent symptoms for more than 2-3 days.   °· You have a fever and your symptoms suddenly get worse.   °· You have signs or symptoms of infection.  These include:   °¨ Chills.   °¨ Abnormal tiredness (lethargy).   °¨ Irritability.   °¨ Poor eating.   °¨ Vomiting.   °· You develop pain that is not helped with medicine.   °· You develop shortness of breath. °· You have pain in your chest.   °· You are coughing up pus-like or bloody sputum.   °· You develop a stiff neck. °· Your feet or hands swell or have pain. °· Your abdomen appears bloated. °· You develop joint pain. °MAKE SURE YOU: °· Understand these instructions. °Document Released: 05/06/2005 Document Revised: 06/12/2013 Document Reviewed: 09/07/2012 °[employer]® Patient Information ©2015 [employer]. This information is not intended to replace advice given to you by your health care provider. Make sure you discuss any questions you have with your health care provider. ° °

## 2014-11-11 NOTE — ED Notes (Signed)
Pt seen leaving the department. I asked pt if he was leaving and he stated "yes."

## 2014-11-11 NOTE — ED Notes (Signed)
Pt sts sickle cell pain crisis with pain in back and legs x 3 days

## 2014-11-15 ENCOUNTER — Ambulatory Visit: Payer: Medicaid - Out of State | Admitting: Family Medicine

## 2014-11-16 ENCOUNTER — Emergency Department (HOSPITAL_COMMUNITY): Payer: Medicaid - Out of State

## 2014-11-16 ENCOUNTER — Encounter (HOSPITAL_COMMUNITY): Payer: Self-pay | Admitting: Emergency Medicine

## 2014-11-16 ENCOUNTER — Inpatient Hospital Stay (HOSPITAL_COMMUNITY)
Admission: EM | Admit: 2014-11-16 | Discharge: 2014-11-18 | DRG: 812 | Disposition: A | Discharge status: Home / Self Care | Payer: Medicaid - Out of State | Attending: Internal Medicine | Admitting: Internal Medicine

## 2014-11-16 DIAGNOSIS — R000 Tachycardia, unspecified: Secondary | ICD-10-CM

## 2014-11-16 DIAGNOSIS — R079 Chest pain, unspecified: Secondary | ICD-10-CM | POA: Diagnosis present

## 2014-11-16 DIAGNOSIS — D649 Anemia, unspecified: Secondary | ICD-10-CM | POA: Diagnosis present

## 2014-11-16 DIAGNOSIS — D5700 Hb-SS disease with crisis, unspecified: Principal | ICD-10-CM | POA: Diagnosis present

## 2014-11-16 DIAGNOSIS — K047 Periapical abscess without sinus: Secondary | ICD-10-CM | POA: Diagnosis present

## 2014-11-16 LAB — CBC WITH DIFFERENTIAL/PLATELET
BASOS PCT: 0 %
Basophils Absolute: 0 10*3/uL (ref 0.0–0.1)
EOS PCT: 3 %
Eosinophils Absolute: 0.6 10*3/uL (ref 0.0–0.7)
HCT: 22.7 % — ABNORMAL LOW (ref 39.0–52.0)
HEMOGLOBIN: 7.9 g/dL — AB (ref 13.0–17.0)
Lymphocytes Relative: 17 %
Lymphs Abs: 3.5 10*3/uL (ref 0.7–4.0)
MCH: 32.5 pg (ref 26.0–34.0)
MCHC: 34.8 g/dL (ref 30.0–36.0)
MCV: 93.4 fL (ref 78.0–100.0)
MONO ABS: 2.2 10*3/uL — AB (ref 0.1–1.0)
Monocytes Relative: 11 %
NEUTROS PCT: 69 %
Neutro Abs: 14 10*3/uL — ABNORMAL HIGH (ref 1.7–7.7)
Platelets: 261 10*3/uL (ref 150–400)
RBC: 2.43 MIL/uL — ABNORMAL LOW (ref 4.22–5.81)
RDW: 18.5 % — ABNORMAL HIGH (ref 11.5–15.5)
WBC: 20.3 10*3/uL — ABNORMAL HIGH (ref 4.0–10.5)

## 2014-11-16 LAB — COMPREHENSIVE METABOLIC PANEL
ALT: 10 U/L — AB (ref 17–63)
AST: 18 U/L (ref 15–41)
Albumin: 4.4 g/dL (ref 3.5–5.0)
Alkaline Phosphatase: 76 U/L (ref 38–126)
Anion gap: 7 (ref 5–15)
BUN: 6 mg/dL (ref 6–20)
CHLORIDE: 105 mmol/L (ref 101–111)
CO2: 27 mmol/L (ref 22–32)
CREATININE: 0.35 mg/dL — AB (ref 0.61–1.24)
Calcium: 9.8 mg/dL (ref 8.9–10.3)
GFR calc non Af Amer: >60 mL/min (ref >60)
Glucose, Bld: 97 mg/dL (ref 65–99)
Potassium: 3.8 mmol/L (ref 3.5–5.1)
SODIUM: 139 mmol/L (ref 135–145)
Total Bilirubin: 5.2 mg/dL — ABNORMAL HIGH (ref 0.3–1.2)
Total Protein: 7.7 g/dL (ref 6.5–8.1)

## 2014-11-16 LAB — RETICULOCYTES
RBC.: 2.43 MIL/uL — ABNORMAL LOW (ref 4.22–5.81)
RETIC CT PCT: 14.5 % — AB (ref 0.4–3.1)
Retic Count, Absolute: 352.4 10*3/uL — ABNORMAL HIGH (ref 19.0–186.0)

## 2014-11-16 LAB — I-STAT TROPONIN, ED: TROPONIN I, POC: 0.01 ng/mL (ref 0.00–0.08)

## 2014-11-16 MED ORDER — HYDROMORPHONE HCL 2 MG/ML IJ SOLN
2.0000 mg | Freq: Once | INTRAMUSCULAR | Status: AC
  Administered 2014-11-16: 2 mg via INTRAVENOUS
  Filled 2014-11-16: qty 1

## 2014-11-16 MED ORDER — SODIUM CHLORIDE 0.9 % IV BOLUS (SEPSIS)
1000.0000 mL | Freq: Once | INTRAVENOUS | Status: AC
  Administered 2014-11-16: 1000 mL via INTRAVENOUS

## 2014-11-16 MED ORDER — DIPHENHYDRAMINE HCL 50 MG PO CAPS
50.0000 mg | ORAL_CAPSULE | Freq: Once | ORAL | Status: DC
  Filled 2014-11-16: qty 2

## 2014-11-16 MED ORDER — CLINDAMYCIN PHOSPHATE 600 MG/50ML IV SOLN
600.0000 mg | Freq: Once | INTRAVENOUS | Status: AC
  Administered 2014-11-16: 600 mg via INTRAVENOUS
  Filled 2014-11-16: qty 50

## 2014-11-16 MED ORDER — ONDANSETRON HCL 4 MG/2ML IJ SOLN
4.0000 mg | Freq: Once | INTRAMUSCULAR | Status: AC
  Administered 2014-11-16: 4 mg via INTRAVENOUS
  Filled 2014-11-16: qty 2

## 2014-11-16 MED ORDER — IOHEXOL 300 MG/ML  SOLN
100.0000 mL | Freq: Once | INTRAMUSCULAR | Status: AC | PRN
  Administered 2014-11-16: 100 mL via INTRAVENOUS

## 2014-11-16 NOTE — ED Notes (Signed)
Report received from [facility] RN  Pt doesn't have IV or blood work yet,  I notified Dahlia Client PA and now pt has labs , blood drawn and meds given

## 2014-11-16 NOTE — ED Notes (Signed)
Pt has had 6 mg Dilaudid without relief,  Pt is to be admitted to hospital

## 2014-11-16 NOTE — ED Notes (Signed)
Patient transported to X-ray 
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
## 2014-11-16 NOTE — ED Notes (Signed)
Pt wants blood draw during IV stick

## 2014-11-16 NOTE — ED Notes (Signed)
Pt reports sickle cell pain crisis for past 3 days in back and chest area. Typical location for crisis. Pt reports he threw up 1x this am.

## 2014-11-17 ENCOUNTER — Encounter (HOSPITAL_COMMUNITY): Payer: Self-pay

## 2014-11-17 DIAGNOSIS — K047 Periapical abscess without sinus: Secondary | ICD-10-CM

## 2014-11-17 DIAGNOSIS — D5700 Hb-SS disease with crisis, unspecified: Secondary | ICD-10-CM | POA: Diagnosis present

## 2014-11-17 DIAGNOSIS — R000 Tachycardia, unspecified: Secondary | ICD-10-CM

## 2014-11-17 DIAGNOSIS — D638 Anemia in other chronic diseases classified elsewhere: Secondary | ICD-10-CM

## 2014-11-17 DIAGNOSIS — R079 Chest pain, unspecified: Secondary | ICD-10-CM | POA: Diagnosis present

## 2014-11-17 LAB — CBC
HEMATOCRIT: 20.8 % — AB (ref 39.0–52.0)
HEMOGLOBIN: 7.4 g/dL — AB (ref 13.0–17.0)
MCH: 32.9 pg (ref 26.0–34.0)
MCHC: 35.6 g/dL (ref 30.0–36.0)
MCV: 92.4 fL (ref 78.0–100.0)
Platelets: 246 10*3/uL (ref 150–400)
RBC: 2.25 MIL/uL — ABNORMAL LOW (ref 4.22–5.81)
RDW: 18 % — ABNORMAL HIGH (ref 11.5–15.5)
WBC: 19.7 10*3/uL — ABNORMAL HIGH (ref 4.0–10.5)

## 2014-11-17 LAB — BASIC METABOLIC PANEL
ANION GAP: 7 (ref 5–15)
BUN: 5 mg/dL — ABNORMAL LOW (ref 6–20)
CO2: 25 mmol/L (ref 22–32)
Calcium: 8.7 mg/dL — ABNORMAL LOW (ref 8.9–10.3)
Chloride: 106 mmol/L (ref 101–111)
Creatinine, Ser: 0.41 mg/dL — ABNORMAL LOW (ref 0.61–1.24)
GFR calc Af Amer: >60 mL/min (ref >60)
GFR calc non Af Amer: >60 mL/min (ref >60)
GLUCOSE: 119 mg/dL — AB (ref 65–99)
POTASSIUM: 3.5 mmol/L (ref 3.5–5.1)
Sodium: 138 mmol/L (ref 135–145)

## 2014-11-17 LAB — TROPONIN I: Troponin I: <0.03 ng/mL (ref <0.031)

## 2014-11-17 LAB — LACTATE DEHYDROGENASE: LDH: 219 U/L — AB (ref 98–192)

## 2014-11-17 LAB — MAGNESIUM: MAGNESIUM: 1.6 mg/dL — AB (ref 1.7–2.4)

## 2014-11-17 MED ORDER — POLYETHYLENE GLYCOL 3350 17 G PO PACK: 17.0000 g | PACK | Freq: Every day | ORAL | Status: DC | PRN

## 2014-11-17 MED ORDER — ALUM & MAG HYDROXIDE-SIMETH 200-200-20 MG/5ML PO SUSP: 30.0000 mL | Freq: Four times a day (QID) | ORAL | Status: DC | PRN

## 2014-11-17 MED ORDER — ONDANSETRON HCL 4 MG/2ML IJ SOLN: 4.0000 mg | Freq: Three times a day (TID) | INTRAMUSCULAR | Status: AC | PRN

## 2014-11-17 MED ORDER — HYDROMORPHONE 0.3 MG/ML IV SOLN
INTRAVENOUS | Status: DC
  Administered 2014-11-17: 01:00:00 via INTRAVENOUS
  Administered 2014-11-17: 4.73 mg via INTRAVENOUS
  Administered 2014-11-17: 4.8 mg via INTRAVENOUS
  Administered 2014-11-17: 1.2 mg via INTRAVENOUS
  Administered 2014-11-17: 3.6 mg via INTRAVENOUS
  Administered 2014-11-17: 11:00:00 via INTRAVENOUS
  Administered 2014-11-18: 2.1 mg via INTRAVENOUS
  Administered 2014-11-18: 0.3 mg via INTRAVENOUS
  Administered 2014-11-18: 3.3 mg via INTRAVENOUS
  Administered 2014-11-18: 10:00:00 via INTRAVENOUS
  Filled 2014-11-17 (×4): qty 25

## 2014-11-17 MED ORDER — ONDANSETRON HCL 4 MG PO TABS: 4.0000 mg | ORAL_TABLET | Freq: Four times a day (QID) | ORAL | Status: DC | PRN

## 2014-11-17 MED ORDER — DIPHENHYDRAMINE HCL 12.5 MG/5ML PO ELIX: 12.5000 mg | ORAL_SOLUTION | Freq: Four times a day (QID) | ORAL | Status: DC | PRN

## 2014-11-17 MED ORDER — SODIUM CHLORIDE 0.9 % IJ SOLN: 9.0000 mL | INTRAMUSCULAR | Status: DC | PRN

## 2014-11-17 MED ORDER — DEXTROSE-NACL 5-0.45 % IV SOLN
INTRAVENOUS | Status: DC
  Administered 2014-11-17 – 2014-11-18 (×4): via INTRAVENOUS

## 2014-11-17 MED ORDER — SODIUM CHLORIDE 0.9 % IV SOLN: INTRAVENOUS | Status: DC

## 2014-11-17 MED ORDER — ACETAMINOPHEN 325 MG PO TABS: 650.0000 mg | ORAL_TABLET | Freq: Four times a day (QID) | ORAL | Status: DC | PRN

## 2014-11-17 MED ORDER — HYDROXYUREA 500 MG PO CAPS
500.0000 mg | ORAL_CAPSULE | Freq: Every day | ORAL | Status: DC
Start: 2014-11-17 — End: 2014-11-18
  Administered 2014-11-17 – 2014-11-18 (×2): 500 mg via ORAL
  Filled 2014-11-17 (×2): qty 1

## 2014-11-17 MED ORDER — ONDANSETRON HCL 4 MG/2ML IJ SOLN: 4.0000 mg | Freq: Four times a day (QID) | INTRAMUSCULAR | Status: DC | PRN

## 2014-11-17 MED ORDER — CLINDAMYCIN PHOSPHATE 600 MG/50ML IV SOLN
600.0000 mg | Freq: Three times a day (TID) | INTRAVENOUS | Status: DC
  Administered 2014-11-17 – 2014-11-18 (×4): 600 mg via INTRAVENOUS
  Filled 2014-11-17 (×6): qty 50

## 2014-11-17 MED ORDER — NALOXONE HCL 0.4 MG/ML IJ SOLN
0.4000 mg | INTRAMUSCULAR | Status: DC | PRN
Start: 2014-11-17 — End: 2014-11-18

## 2014-11-17 MED ORDER — HYDROMORPHONE HCL 1 MG/ML IJ SOLN
1.0000 mg | INTRAMUSCULAR | Status: AC | PRN
  Administered 2014-11-17: 1 mg via INTRAVENOUS
  Filled 2014-11-17: qty 1

## 2014-11-17 MED ORDER — DIPHENHYDRAMINE HCL 50 MG/ML IJ SOLN
12.5000 mg | Freq: Four times a day (QID) | INTRAMUSCULAR | Status: DC | PRN
  Administered 2014-11-17 – 2014-11-18 (×3): 12.5 mg via INTRAVENOUS
  Filled 2014-11-17 (×3): qty 1

## 2014-11-17 MED ORDER — SENNOSIDES-DOCUSATE SODIUM 8.6-50 MG PO TABS
1.0000 | ORAL_TABLET | Freq: Two times a day (BID) | ORAL | Status: DC
  Administered 2014-11-17 – 2014-11-18 (×3): 1 via ORAL
  Filled 2014-11-17 (×6): qty 1

## 2014-11-17 MED ORDER — ENOXAPARIN SODIUM 40 MG/0.4ML ~~LOC~~ SOLN
40.0000 mg | Freq: Every day | SUBCUTANEOUS | Status: DC
  Filled 2014-11-17 (×2): qty 0.4

## 2014-11-17 MED ORDER — ACETAMINOPHEN 650 MG RE SUPP: 650.0000 mg | Freq: Four times a day (QID) | RECTAL | Status: DC | PRN

## 2014-11-17 MED ORDER — FOLIC ACID 1 MG PO TABS
1.0000 mg | ORAL_TABLET | Freq: Every day | ORAL | Status: DC
  Administered 2014-11-17 – 2014-11-18 (×2): 1 mg via ORAL
  Filled 2014-11-17 (×2): qty 1

## 2014-11-17 MED ORDER — ENOXAPARIN SODIUM 30 MG/0.3ML ~~LOC~~ SOLN
30.0000 mg | Freq: Every day | SUBCUTANEOUS | Status: DC
  Filled 2014-11-17: qty 0.3
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
## 2014-11-17 NOTE — ED Notes (Signed)
Pt has been resting quietly ,, no complaints of pain, or nausea

## 2014-11-18 DIAGNOSIS — D5700 Hb-SS disease with crisis, unspecified: Principal | ICD-10-CM

## 2014-11-18 MED ORDER — CLINDAMYCIN HCL 300 MG PO CAPS: 300.0000 mg | ORAL_CAPSULE | Freq: Three times a day (TID) | ORAL | Status: DC

## 2014-11-18 MED ORDER — OXYCODONE HCL 10 MG PO TABS: 10.0000 mg | ORAL_TABLET | ORAL | Status: DC

## 2014-11-18 MED ORDER — MORPHINE SULFATE ER 30 MG PO TBCR
30.0000 mg | EXTENDED_RELEASE_TABLET | Freq: Two times a day (BID) | ORAL | Status: DC
  Administered 2014-11-18: 30 mg via ORAL
  Filled 2014-11-18: qty 1

## 2014-11-18 NOTE — Progress Notes (Signed)
Went over d/c instructions with patient.  Patient verbalized understanding.  Patient left hospital with hard scripts and personal belongings via w/c. Levora Angel, RN

## 2014-11-18 NOTE — Discharge Summary (Signed)
Physician Discharge Summary  Patient ID: Matthew Shannon MRN: [id number] DOB/AGE: [birth year] 26 y.o.  Admit date: 11/16/2014 Discharge date: 11/18/2014  Admission Diagnoses:  Discharge Diagnoses:  Principal Problem:   Sickle cell pain crisis [facility]) Active Problems:   Anemia   Dental infection   Chest pain   Discharged Condition: good  Hospital Course: Patient admitted with sickle cell pain triggered by left tooth abscess and swelling of his jaw. Was treated with IV Clindamycin and PCA pump. Has responded to treatment. Has pain down to 4/10. Has been transitioned to oral Clindamycin and on his MS contin and oral Oxycodone. Has been asked to follow up with his PCP. He apparently has no PCP now but is looking.   Consults: None  Significant Diagnostic Studies: labs: CBCs and CMPs checked. Mostly within normal limits.  Treatments: IV hydration, antibiotics: Clindamycin and analgesia: Dilaudid  Discharge Exam: Blood pressure 125/73, pulse 84, temperature 98.6 F (37 C), temperature source Oral, resp. rate 18, height  (1.778 m), weight 59.512 kg (131 lb 3.2 oz), SpO2 100 %. General appearance: alert, cooperative and no distress Head: Normocephalic, without obvious abnormality, atraumatic, Left maxillar swollen Nose: Nares normal. Septum midline. Mucosa normal. No drainage or sinus tenderness. Back: symmetric, no curvature. ROM normal. No CVA tenderness. Resp: clear to auscultation bilaterally Chest wall: no tenderness Cardio: regular rate and rhythm, S1, S2 normal, no murmur, click, rub or gallop GI: soft, non-tender; bowel sounds normal; no masses,  no organomegaly Extremities: extremities normal, atraumatic, no cyanosis or edema Pulses: 2+ and symmetric Skin: Skin color, texture, turgor normal. No rashes or lesions Neurologic: Grossly normal  Disposition: 01-Home or Self Care     Medication List    STOP taking these medications        levofloxacin 500 MG tablet   Commonly known as:  LEVAQUIN     oxyCODONE-acetaminophen 5-325 MG tablet  Commonly known as:  PERCOCET      TAKE these medications        clindamycin 300 MG capsule  Commonly known as:  CLEOCIN  Take 1 capsule (300 mg total) by mouth 3 (three) times daily.     folic acid 1 MG tablet  Commonly known as:  FOLVITE  Take 1 tablet (1 mg total) by mouth daily.     hydroxyurea 500 MG capsule  Commonly known as:  HYDREA  Take 1 capsule (500 mg total) by mouth daily. May take with food to minimize GI side effects.     morphine 30 MG 12 hr tablet  Commonly known as:  MS CONTIN  Take 1 tablet (30 mg total) by mouth every 12 (twelve) hours.     ondansetron 8 MG tablet  Commonly known as:  ZOFRAN  Take 1 tablet (8 mg total) by mouth every 8 (eight) hours as needed for nausea or vomiting.     Oxycodone HCl 10 MG Tabs  Take 1 tablet (10 mg total) by mouth every 4 (four) hours.         SignedLonia Blood 11/18/2014, 12:57 PM  Time spent 32 minutes

## 2014-11-20 ENCOUNTER — Encounter (HOSPITAL_COMMUNITY): Payer: Self-pay

## 2014-11-20 ENCOUNTER — Emergency Department (HOSPITAL_COMMUNITY)
Admission: EM | Admit: 2014-11-20 | Discharge: 2014-11-20 | Disposition: A | Discharge status: Home / Self Care | Payer: Medicaid - Out of State | Attending: Emergency Medicine | Admitting: Emergency Medicine

## 2014-11-20 DIAGNOSIS — D57219 Sickle-cell/Hb-C disease with crisis, unspecified: Secondary | ICD-10-CM | POA: Diagnosis not present

## 2014-11-20 DIAGNOSIS — K047 Periapical abscess without sinus: Secondary | ICD-10-CM

## 2014-11-20 DIAGNOSIS — Z79899 Other long term (current) drug therapy: Secondary | ICD-10-CM | POA: Insufficient documentation

## 2014-11-20 DIAGNOSIS — K029 Dental caries, unspecified: Secondary | ICD-10-CM | POA: Diagnosis not present

## 2014-11-20 DIAGNOSIS — Z79891 Long term (current) use of opiate analgesic: Secondary | ICD-10-CM | POA: Diagnosis not present

## 2014-11-20 DIAGNOSIS — Z720 Tobacco use: Secondary | ICD-10-CM | POA: Insufficient documentation

## 2014-11-20 DIAGNOSIS — Z792 Long term (current) use of antibiotics: Secondary | ICD-10-CM | POA: Diagnosis not present

## 2014-11-20 DIAGNOSIS — G8929 Other chronic pain: Secondary | ICD-10-CM

## 2014-11-20 DIAGNOSIS — K0889 Other specified disorders of teeth and supporting structures: Secondary | ICD-10-CM | POA: Diagnosis present

## 2014-11-20 LAB — RETICULOCYTES
RBC.: 2.5 MIL/uL — AB (ref 4.22–5.81)
RETIC CT PCT: 18.8 % — AB (ref 0.4–3.1)
Retic Count, Absolute: 470 10*3/uL — ABNORMAL HIGH (ref 19.0–186.0)

## 2014-11-20 LAB — CBC WITH DIFFERENTIAL/PLATELET
Basophils Absolute: 0.1 10*3/uL (ref 0.0–0.1)
Basophils Relative: 1 %
Eosinophils Absolute: 0.8 10*3/uL — ABNORMAL HIGH (ref 0.0–0.7)
Eosinophils Relative: 5 %
HEMATOCRIT: 22.2 % — AB (ref 39.0–52.0)
HEMOGLOBIN: 7.7 g/dL — AB (ref 13.0–17.0)
LYMPHS ABS: 3.6 10*3/uL (ref 0.7–4.0)
LYMPHS PCT: 25 %
MCH: 31.2 pg (ref 26.0–34.0)
MCHC: 34.7 g/dL (ref 30.0–36.0)
MCV: 89.9 fL (ref 78.0–100.0)
Monocytes Absolute: 2.4 10*3/uL — ABNORMAL HIGH (ref 0.1–1.0)
Monocytes Relative: 16 %
NEUTROS PCT: 53 %
Neutro Abs: 7.6 10*3/uL (ref 1.7–7.7)
Platelets: 266 10*3/uL (ref 150–400)
RBC: 2.47 MIL/uL — AB (ref 4.22–5.81)
RDW: 20.6 % — ABNORMAL HIGH (ref 11.5–15.5)
WBC: 14.4 10*3/uL — AB (ref 4.0–10.5)

## 2014-11-20 LAB — COMPREHENSIVE METABOLIC PANEL
ALT: 23 U/L (ref 17–63)
AST: 43 U/L — AB (ref 15–41)
Albumin: 4.8 g/dL (ref 3.5–5.0)
Alkaline Phosphatase: 91 U/L (ref 38–126)
Anion gap: 8 (ref 5–15)
BUN: 8 mg/dL (ref 6–20)
CHLORIDE: 105 mmol/L (ref 101–111)
CO2: 26 mmol/L (ref 22–32)
Calcium: 9.7 mg/dL (ref 8.9–10.3)
Creatinine, Ser: 0.41 mg/dL — ABNORMAL LOW (ref 0.61–1.24)
Glucose, Bld: 79 mg/dL (ref 65–99)
POTASSIUM: 4 mmol/L (ref 3.5–5.1)
SODIUM: 139 mmol/L (ref 135–145)
Total Bilirubin: 5.4 mg/dL — ABNORMAL HIGH (ref 0.3–1.2)
Total Protein: 8.7 g/dL — ABNORMAL HIGH (ref 6.5–8.1)

## 2014-11-20 MED ORDER — OXYCODONE-ACETAMINOPHEN 5-325 MG PO TABS
2.0000 | ORAL_TABLET | Freq: Once | ORAL | Status: AC
  Administered 2014-11-20: 2 via ORAL
  Filled 2014-11-20: qty 2

## 2014-11-20 NOTE — ED Notes (Signed)
Pt c/o L lower dental infection and generalized back pain r/t Sickle Cell Crisis x 1 week.  Pain score 8/10.  Pt was recently admitted for same and reports that he was unable to get antibiotic filled, because he "is new to town and insurance hasn't kicked in."

## 2014-11-20 NOTE — Discharge Instructions (Signed)
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
## 2014-11-21 ENCOUNTER — Emergency Department (HOSPITAL_COMMUNITY)
Admission: EM | Admit: 2014-11-21 | Discharge: 2014-11-21 | Disposition: A | Discharge status: Home / Self Care | Payer: Medicaid - Out of State | Attending: Emergency Medicine | Admitting: Emergency Medicine

## 2014-11-21 ENCOUNTER — Encounter (HOSPITAL_COMMUNITY): Payer: Self-pay | Admitting: Emergency Medicine

## 2014-11-21 DIAGNOSIS — Z720 Tobacco use: Secondary | ICD-10-CM | POA: Insufficient documentation

## 2014-11-21 DIAGNOSIS — D5700 Hb-SS disease with crisis, unspecified: Secondary | ICD-10-CM | POA: Diagnosis not present

## 2014-11-21 DIAGNOSIS — Z792 Long term (current) use of antibiotics: Secondary | ICD-10-CM | POA: Insufficient documentation

## 2014-11-21 DIAGNOSIS — D849 Immunodeficiency, unspecified: Secondary | ICD-10-CM | POA: Insufficient documentation

## 2014-11-21 DIAGNOSIS — Z79899 Other long term (current) drug therapy: Secondary | ICD-10-CM | POA: Insufficient documentation

## 2014-11-21 DIAGNOSIS — M545 Low back pain: Secondary | ICD-10-CM | POA: Diagnosis present

## 2014-11-21 LAB — I-STAT CHEM 8, ED
BUN: 8 mg/dL (ref 6–20)
CALCIUM ION: 1.24 mmol/L — AB (ref 1.12–1.23)
CREATININE: 0.5 mg/dL — AB (ref 0.61–1.24)
Chloride: 107 mmol/L (ref 101–111)
GLUCOSE: 117 mg/dL — AB (ref 65–99)
HCT: 25 % — ABNORMAL LOW (ref 39.0–52.0)
HEMOGLOBIN: 8.5 g/dL — AB (ref 13.0–17.0)
POTASSIUM: 4.1 mmol/L (ref 3.5–5.1)
Sodium: 145 mmol/L (ref 135–145)
TCO2: 24 mmol/L (ref 0–100)

## 2014-11-21 LAB — CBC
HEMATOCRIT: 20.1 % — AB (ref 39.0–52.0)
Hemoglobin: 7 g/dL — ABNORMAL LOW (ref 13.0–17.0)
MCH: 31.5 pg (ref 26.0–34.0)
MCHC: 34.8 g/dL (ref 30.0–36.0)
MCV: 90.5 fL (ref 78.0–100.0)
PLATELETS: 279 10*3/uL (ref 150–400)
RBC: 2.22 MIL/uL — ABNORMAL LOW (ref 4.22–5.81)
RDW: 20.7 % — AB (ref 11.5–15.5)
WBC: 19 10*3/uL — ABNORMAL HIGH (ref 4.0–10.5)

## 2014-11-21 LAB — URINALYSIS, ROUTINE W REFLEX MICROSCOPIC
Bilirubin Urine: NEGATIVE
GLUCOSE, UA: NEGATIVE mg/dL
HGB URINE DIPSTICK: NEGATIVE
KETONES UR: NEGATIVE mg/dL
LEUKOCYTES UA: NEGATIVE
Nitrite: NEGATIVE
PH: 8 (ref 5.0–8.0)
Protein, ur: NEGATIVE mg/dL
Specific Gravity, Urine: 1.011 (ref 1.005–1.030)
Urobilinogen, UA: 1 mg/dL (ref 0.0–1.0)

## 2014-11-21 MED ORDER — HYDROMORPHONE HCL 2 MG/ML IJ SOLN
2.0000 mg | Freq: Once | INTRAMUSCULAR | Status: AC
  Administered 2014-11-21: 2 mg via INTRAVENOUS
  Filled 2014-11-21: qty 1

## 2014-11-21 MED ORDER — DIPHENHYDRAMINE HCL 25 MG PO CAPS
50.0000 mg | ORAL_CAPSULE | Freq: Once | ORAL | Status: AC
  Administered 2014-11-21: 50 mg via ORAL
  Filled 2014-11-21: qty 2

## 2014-11-21 MED ORDER — DIPHENHYDRAMINE HCL 25 MG PO CAPS
25.0000 mg | ORAL_CAPSULE | Freq: Once | ORAL | Status: AC
  Administered 2014-11-21: 25 mg via ORAL
  Filled 2014-11-21: qty 1

## 2014-11-21 MED ORDER — OXYCODONE-ACETAMINOPHEN 5-325 MG PO TABS
2.0000 | ORAL_TABLET | Freq: Once | ORAL | Status: AC
  Administered 2014-11-21: 2 via ORAL
  Filled 2014-11-21: qty 2

## 2014-11-21 NOTE — ED Notes (Signed)
PT REQUEST THAT THE SAMPLES BE COLLECT AT THE TIME THE IV WAS PLACED

## 2014-11-21 NOTE — Progress Notes (Addendum)
   11/21/14 0000  CM Assessment  Expected Discharge Plan Home/Self Care  In-house Referral NA  Choice offered to / list presented to  Patient  Status of Service Completed, signed off  Discharge Disposition Home/Self Care   [facility] Pt has been seen by Dr Hyman HopesJegede on 10/16/14 but pt informed Cm he does not prefer to see Dr Hyman Hopesjegede " He ws not helping me with my pain" No further appts were made to see Dr Hyman HopesJegede Pt states he wnts another provider and has been home "babysitting" and has not had opportunity to contact DSS to [facility] [place] in the last "two months" that he has been in [place]. [facility] to find a "Pain management doctor' but he has not done so yet CM strongly encouraged pt to follow up with the resources offered to him using the DSS contact number [phone] and access [email or web address].ncdhhs.gov with his internet on his smart phone sitting on the table  CM discussed with pt his 21 ED visits, differences in EDPs and pcps and specialist for sickle cell and need for him to be compliant with the resources offered to him in the last "two months"  Pt does not have his [place] medicaid card nor a copy of it with him and it is not scanned in EPIC documents  Pt agreed to complete the [facility] [place] referral form indicating his needs of finances, pcp, prescriptions and transportation  Cm faxed referral to [phone] and pt given a copy of the referral form to consult [facility] because he provided his return number as [phone]
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
## 2014-11-21 NOTE — ED Notes (Signed)
Pt now requesting if MD will give 2 percocets before he goes home, stating "I haven't had the money to get my Rx filled."  Dr. Ethelda ChickJacubowitz informed.

## 2014-11-21 NOTE — Discharge Instructions (Signed)
Sickle Cell Anemia, Adult Return if her pain is not well-controlled with the medications that you take at home or follow up with the sickle cell clinic Sickle cell anemia is a condition where your red blood cells are shaped like sickles. Red blood cells carry oxygen through the body. Sickle-shaped red blood cells do not live as long as normal red blood cells. They also clump together and block blood from flowing through the blood vessels. These things prevent the body from getting enough oxygen. Sickle cell anemia causes organ damage and pain. It also increases the risk of infection. HOME CARE  Drink enough fluid to keep your pee (urine) clear or pale yellow. Drink more in hot weather and during exercise.  Do not smoke. Smoking lowers oxygen levels in the blood.  Only take over-the-counter or prescription medicines as told by your doctor.  Take antibiotic medicines as told by your doctor. Make sure you finish them even if you start to feel better.  Take supplements as told by your doctor.  Consider wearing a medical alert bracelet. This tells anyone caring for you in an emergency of your condition.  When traveling, keep your medical information, doctors' names, and the medicines you take with you at all times.  If you have a fever, do not take fever medicines right away. This could cover up a problem. Tell your doctor.   Keep all follow-up visits with your doctor. Sickle cell anemia requires regular medical care. GET HELP IF: You have a fever. GET HELP RIGHT AWAY IF:  You feel dizzy or faint.  You have new belly (abdominal) pain, especially on the left side near the stomach area.  You have a lasting, often uncomfortable and painful erection of the penis (priapism). If it is not treated right away, you will become unable to have sex (impotence).  You have numbness in your arms or legs or you have a hard time moving them.  You have a hard time talking.  You have a fever or lasting  symptoms for more than 2-3 days.  You have a fever and your symptoms suddenly get worse.  You have signs or symptoms of infection. These include:  Chills.  Being more tired than normal (lethargy).  Irritability.  Poor eating.  Throwing up (vomiting).  You have pain that is not helped with medicine.  You have shortness of breath.  You have pain in your chest.  You are coughing up pus-like or bloody mucus.  You have a stiff neck.  Your feet or hands swell or have pain.  Your belly looks bloated.  Your joints hurt. MAKE SURE YOU:  Understand these instructions.  Will watch your condition.  Will get help right away if you are not doing well or get worse.   This information is not intended to replace advice given to you by your health care provider. Make sure you discuss any questions you have with your health care provider.   Document Released: 11/16/2012 Document Revised: 06/12/2014 Document Reviewed: 11/16/2012 Elsevier Interactive Patient Education [employer]2016 [employer] Inc.

## 2014-11-21 NOTE — ED Notes (Signed)
Pt comes from home per EMS with lower back pain x 4 days.  Pt has Sickle cell.  Pt on 2Lo2 via [place].

## 2014-11-21 NOTE — ED Notes (Signed)
Bed: WA08 Expected date:  Expected time:  Means of arrival:  Comments: EMS- 24yo M, SCC/back pain

## 2014-11-22 ENCOUNTER — Emergency Department (HOSPITAL_COMMUNITY)
Admission: EM | Admit: 2014-11-22 | Discharge: 2014-11-22 | Disposition: A | Discharge status: Home / Self Care | Payer: Medicaid - Out of State | Source: Home / Self Care | Attending: Emergency Medicine | Admitting: Emergency Medicine

## 2014-11-22 ENCOUNTER — Encounter (HOSPITAL_COMMUNITY): Payer: Self-pay | Admitting: Emergency Medicine

## 2014-11-22 DIAGNOSIS — Z792 Long term (current) use of antibiotics: Secondary | ICD-10-CM | POA: Insufficient documentation

## 2014-11-22 DIAGNOSIS — Z9114 Patient's other noncompliance with medication regimen: Secondary | ICD-10-CM

## 2014-11-22 DIAGNOSIS — Z79899 Other long term (current) drug therapy: Secondary | ICD-10-CM

## 2014-11-22 DIAGNOSIS — D5700 Hb-SS disease with crisis, unspecified: Secondary | ICD-10-CM | POA: Insufficient documentation

## 2014-11-22 DIAGNOSIS — R011 Cardiac murmur, unspecified: Secondary | ICD-10-CM

## 2014-11-22 DIAGNOSIS — Z9119 Patient's noncompliance with other medical treatment and regimen: Secondary | ICD-10-CM

## 2014-11-22 MED ORDER — HYDROMORPHONE HCL 2 MG/ML IJ SOLN
2.0000 mg | Freq: Once | INTRAMUSCULAR | Status: AC
  Administered 2014-11-22: 2 mg via INTRAMUSCULAR
  Filled 2014-11-22: qty 1

## 2014-11-22 MED ORDER — MORPHINE SULFATE ER 30 MG PO TBCR
30.0000 mg | EXTENDED_RELEASE_TABLET | Freq: Two times a day (BID) | ORAL | Status: AC
  Administered 2014-11-22: 30 mg via ORAL
  Filled 2014-11-22: qty 2

## 2014-11-22 MED ORDER — DIPHENHYDRAMINE HCL 50 MG/ML IJ SOLN
25.0000 mg | Freq: Once | INTRAMUSCULAR | Status: AC
  Administered 2014-11-22: 25 mg via INTRAMUSCULAR
  Filled 2014-11-22: qty 1

## 2014-11-22 NOTE — Discharge Instructions (Signed)
Sickle Cell Anemia, Adult Sickle cell anemia is a condition in which red blood cells have an abnormal "sickle" shape. This abnormal shape shortens the cells' life span, which results in a lower than normal concentration of red blood cells in the blood. The sickle shape also causes the cells to clump together and block free blood flow through the blood vessels. As a result, the tissues and organs of the body do not receive enough oxygen. Sickle cell anemia causes organ damage and pain and increases the risk of infection. CAUSES  Sickle cell anemia is a genetic disorder. Those who receive two copies of the gene have the condition, and those who receive one copy have the trait. RISK FACTORS The sickle cell gene is most common in people whose families originated in [place]. Other areas of the globe where sickle cell trait occurs include the Mediterranean, [place] and [place], the [place], and the [place].  SIGNS AND SYMPTOMS  Pain, especially in the extremities, back, chest, or abdomen (common). The pain may start suddenly or may develop following an illness, especially if there is dehydration. Pain can also occur due to overexertion or exposure to extreme temperature changes.  Frequent severe bacterial infections, especially certain types of pneumonia and meningitis.  Pain and swelling in the hands and feet.  Decreased activity.   Loss of appetite.   Change in behavior.  Headaches.  Seizures.  Shortness of breath or difficulty breathing.  Vision changes.  Skin ulcers. Those with the trait may not have symptoms or they may have mild symptoms.  DIAGNOSIS  Sickle cell anemia is diagnosed with blood tests that demonstrate the genetic trait. It is often diagnosed during the newborn period, due to mandatory testing nationwide. A variety of blood tests, X-rays, CT scans, MRI scans, ultrasounds, and lung function tests may also be done to monitor the condition. TREATMENT  Sickle  cell anemia may be treated with:  Medicines. You may be given pain medicines, antibiotic medicines (to treat and prevent infections) or medicines to increase the production of certain types of hemoglobin.  Fluids.  Oxygen.  Blood transfusions. HOME CARE INSTRUCTIONS   Drink enough fluid to keep your urine clear or pale yellow. Increase your fluid intake in hot weather and during exercise.  Do not smoke. Smoking lowers oxygen levels in the blood.   Only take over-the-counter or prescription medicines for pain, fever, or discomfort as directed by your health care provider.  Take antibiotics as directed by your health care provider. Make sure you finish them it even if you start to feel better.   Take supplements as directed by your health care provider.   Consider wearing a medical alert bracelet. This tells anyone caring for you in an emergency of your condition.   When traveling, keep your medical information, health care provider's names, and the medicines you take with you at all times.   If you develop a fever, do not take medicines to reduce the fever right away. This could cover up a problem that is developing. Notify your health care provider.  Keep all follow-up appointments with your health care provider. Sickle cell anemia requires regular medical care. SEEK MEDICAL CARE IF: You have a fever. SEEK IMMEDIATE MEDICAL CARE IF:   You feel dizzy or faint.   You have new abdominal pain, especially on the left side near the stomach area.   You develop a persistent, often uncomfortable and painful penile erection (priapism). If this is not treated immediately it   will lead to impotence.   You have numbness your arms or legs or you have a hard time moving them.   You have a hard time with speech.   You have a fever or persistent symptoms for more than 2-3 days.   You have a fever and your symptoms suddenly get worse.   You have signs or symptoms of infection.  These include:   Chills.   Abnormal tiredness (lethargy).   Irritability.   Poor eating.   Vomiting.   You develop pain that is not helped with medicine.   You develop shortness of breath.  You have pain in your chest.   You are coughing up pus-like or bloody sputum.   You develop a stiff neck.  Your feet or hands swell or have pain.  Your abdomen appears bloated.  You develop joint pain. MAKE SURE YOU:  Understand these instructions.   This information is not intended to replace advice given to you by your health care provider. Make sure you discuss any questions you have with your health care provider.   Document Released: 05/06/2005 Document Revised: 02/16/2014 Document Reviewed: 09/07/2012 Elsevier Interactive Patient Education 2016 Elsevier Inc.  

## 2014-11-22 NOTE — ED Notes (Addendum)
Pt alert and oriented x4. Respirations even and unlabored, bilateral symmetrical rise and fall of chest. Skin warm and dry. In no acute distress. Denies needs.   md at bedside  Pt was discharged last night, did not get his pain medication filled.
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
## 2014-11-22 NOTE — ED Notes (Signed)
Pt presents for SSC x2-3 days. Pain in back, pt states sickle cell clinic has no room for him and he can't see PCP until next week. No relief with prescriptions. Ambulatory with steady gait. Denies other complaints.

## 2014-11-22 NOTE — Progress Notes (Addendum)
ED CM saw pt on 11/21/14  See copy of 11/21/14 notes   [facility] Pt has been seen by Dr Hyman HopesJegede on 10/16/14 but pt informed Cm he does not prefer to see Dr Hyman Hopesjegede " He ws not helping me with my pain" No further appts were made to see Dr Hyman HopesJegede Pt states he wnts another provider and has been home "babysitting" and has not had opportunity to contact DSS to [facility] [place] in the last "two months" that he has been in [place]. [facility] encouraged him to find a "Pain management doctor' but he has not done so yet CM strongly encouraged pt to follow up with the resources offered to him using the DSS contact number [phone] and access Rank[employer]ider.[email or web address]://dma.ncdhhs.gov with his internet on his smart phone sitting on the table  CM discussed with pt his 21 ED visits, differences in EDPs and pcps and specialist for sickle cell and need for him to be compliant with the resources offered to him in the last "two months" Pt does not have his [place] medicaid card nor a copy of it with him and it is not scanned in EPIC documents  Pt agreed to complete the [facility] [place] referral form indicating his needs of finances, pcp, prescriptions and transportation  Cm faxed referral to [phone] and pt given a copy of the referral form to consult when [facility] is [facility] because he provided his return number as [phone]  Today ED CM attempted to call [facility] to attempt to get pt an appt but no answer CM [facility]  CM to see if there is an available [facility] appt for pt  1459 an appt was obtained for pt and he was given a written sheet to remind him   Follow-up With Details Why Contact Info [facility] Go on 11/29/2014 You have been scheduled to see the NP L Hollis on 11/29/14 at 1:30 pm PLEASE SHOW for this appt Please use the medicaid transportation services call [phone] or [phone] [place] [phone]

## 2014-11-22 NOTE — ED Notes (Signed)
Pt given crackers and peanut butter.

## 2014-11-23 ENCOUNTER — Emergency Department (HOSPITAL_COMMUNITY)
Admission: EM | Admit: 2014-11-23 | Discharge: 2014-11-23 | Disposition: A | Discharge status: Home / Self Care | Payer: Medicaid - Out of State | Source: Home / Self Care | Attending: Emergency Medicine | Admitting: Emergency Medicine

## 2014-11-23 ENCOUNTER — Inpatient Hospital Stay (HOSPITAL_COMMUNITY)
Admission: EM | Admit: 2014-11-23 | Discharge: 2014-11-24 | DRG: 812 | Disposition: A | Discharge status: Home / Self Care | Payer: Medicaid - Out of State | Attending: Internal Medicine | Admitting: Internal Medicine

## 2014-11-23 ENCOUNTER — Encounter (HOSPITAL_COMMUNITY): Payer: Self-pay | Admitting: Emergency Medicine

## 2014-11-23 DIAGNOSIS — K047 Periapical abscess without sinus: Secondary | ICD-10-CM | POA: Diagnosis present

## 2014-11-23 DIAGNOSIS — Z9114 Patient's other noncompliance with medication regimen: Secondary | ICD-10-CM

## 2014-11-23 DIAGNOSIS — Z833 Family history of diabetes mellitus: Secondary | ICD-10-CM | POA: Diagnosis not present

## 2014-11-23 DIAGNOSIS — Z720 Tobacco use: Secondary | ICD-10-CM | POA: Insufficient documentation

## 2014-11-23 DIAGNOSIS — F17210 Nicotine dependence, cigarettes, uncomplicated: Secondary | ICD-10-CM | POA: Diagnosis present

## 2014-11-23 DIAGNOSIS — D5700 Hb-SS disease with crisis, unspecified: Secondary | ICD-10-CM | POA: Diagnosis not present

## 2014-11-23 DIAGNOSIS — D72829 Elevated white blood cell count, unspecified: Secondary | ICD-10-CM | POA: Diagnosis present

## 2014-11-23 DIAGNOSIS — Z79899 Other long term (current) drug therapy: Secondary | ICD-10-CM | POA: Insufficient documentation

## 2014-11-23 DIAGNOSIS — Z832 Family history of diseases of the blood and blood-forming organs and certain disorders involving the immune mechanism: Secondary | ICD-10-CM | POA: Diagnosis not present

## 2014-11-23 DIAGNOSIS — Z7982 Long term (current) use of aspirin: Secondary | ICD-10-CM | POA: Diagnosis not present

## 2014-11-23 DIAGNOSIS — M549 Dorsalgia, unspecified: Secondary | ICD-10-CM | POA: Diagnosis present

## 2014-11-23 LAB — CBC WITH DIFFERENTIAL/PLATELET
Basophils Absolute: 0.2 10*3/uL — ABNORMAL HIGH (ref 0.0–0.1)
Basophils Relative: 1 %
EOS PCT: 2 %
Eosinophils Absolute: 0.4 10*3/uL (ref 0.0–0.7)
HEMATOCRIT: 24.1 % — AB (ref 39.0–52.0)
HEMOGLOBIN: 8.2 g/dL — AB (ref 13.0–17.0)
LYMPHS ABS: 3.4 10*3/uL (ref 0.7–4.0)
Lymphocytes Relative: 19 %
MCH: 32 pg (ref 26.0–34.0)
MCHC: 34 g/dL (ref 30.0–36.0)
MCV: 94.1 fL (ref 78.0–100.0)
MONOS PCT: 12 %
Monocytes Absolute: 2.2 10*3/uL — ABNORMAL HIGH (ref 0.1–1.0)
Neutro Abs: 11.8 10*3/uL — ABNORMAL HIGH (ref 1.7–7.7)
Neutrophils Relative %: 66 %
Platelets: 352 10*3/uL (ref 150–400)
RBC: 2.56 MIL/uL — AB (ref 4.22–5.81)
RDW: 23 % — AB (ref 11.5–15.5)
WBC: 18 10*3/uL — AB (ref 4.0–10.5)

## 2014-11-23 LAB — COMPREHENSIVE METABOLIC PANEL
ALT: 18 U/L (ref 17–63)
AST: 29 U/L (ref 15–41)
Albumin: 4.3 g/dL (ref 3.5–5.0)
Alkaline Phosphatase: 81 U/L (ref 38–126)
Anion gap: 9 (ref 5–15)
BILIRUBIN TOTAL: 2.8 mg/dL — AB (ref 0.3–1.2)
BUN: 7 mg/dL (ref 6–20)
CALCIUM: 9.3 mg/dL (ref 8.9–10.3)
CO2: 23 mmol/L (ref 22–32)
CREATININE: 0.59 mg/dL — AB (ref 0.61–1.24)
Chloride: 108 mmol/L (ref 101–111)
GFR calc Af Amer: >60 mL/min (ref >60)
Glucose, Bld: 79 mg/dL (ref 65–99)
POTASSIUM: 4.6 mmol/L (ref 3.5–5.1)
Sodium: 140 mmol/L (ref 135–145)
TOTAL PROTEIN: 7.8 g/dL (ref 6.5–8.1)

## 2014-11-23 LAB — RETICULOCYTES
RBC.: 2.56 MIL/uL — ABNORMAL LOW (ref 4.22–5.81)
RETIC COUNT ABSOLUTE: 527.4 10*3/uL — AB (ref 19.0–186.0)
RETIC CT PCT: 20.6 % — AB (ref 0.4–3.1)

## 2014-11-23 MED ORDER — ACETAMINOPHEN 325 MG PO TABS: 650.0000 mg | ORAL_TABLET | Freq: Four times a day (QID) | ORAL | Status: DC | PRN

## 2014-11-23 MED ORDER — HYDROMORPHONE HCL 1 MG/ML IJ SOLN
1.0000 mg | INTRAMUSCULAR | Status: DC | PRN
  Administered 2014-11-24 (×4): 2 mg via INTRAVENOUS
  Filled 2014-11-23 (×4): qty 2

## 2014-11-23 MED ORDER — CLINDAMYCIN HCL 300 MG PO CAPS
300.0000 mg | ORAL_CAPSULE | Freq: Three times a day (TID) | ORAL | Status: DC
  Administered 2014-11-23 – 2014-11-24 (×2): 300 mg via ORAL
  Filled 2014-11-23 (×4): qty 1

## 2014-11-23 MED ORDER — SODIUM CHLORIDE 0.45 % IV SOLN
INTRAVENOUS | Status: DC
  Administered 2014-11-23 – 2014-11-24 (×2): via INTRAVENOUS

## 2014-11-23 MED ORDER — HYDROMORPHONE HCL 2 MG/ML IJ SOLN
2.0000 mg | INTRAMUSCULAR | Status: AC | PRN
  Administered 2014-11-23 (×3): 2 mg via INTRAVENOUS
  Filled 2014-11-23 (×3): qty 1

## 2014-11-23 MED ORDER — DIPHENHYDRAMINE HCL 25 MG PO CAPS: 25.0000 mg | ORAL_CAPSULE | Freq: Four times a day (QID) | ORAL | Status: DC | PRN

## 2014-11-23 MED ORDER — SODIUM CHLORIDE 0.9 % IV SOLN
25.0000 mg | INTRAVENOUS | Status: DC | PRN
  Administered 2014-11-23: 25 mg via INTRAVENOUS
  Filled 2014-11-23: qty 0.5

## 2014-11-23 MED ORDER — HYDROMORPHONE HCL 1 MG/ML IJ SOLN
1.0000 mg | Freq: Once | INTRAMUSCULAR | Status: AC
  Administered 2014-11-23: 1 mg via INTRAVENOUS
  Filled 2014-11-23: qty 1

## 2014-11-23 MED ORDER — NICOTINE 21 MG/24HR TD PT24
21.0000 mg | MEDICATED_PATCH | Freq: Every day | TRANSDERMAL | Status: DC
  Filled 2014-11-23: qty 1

## 2014-11-23 MED ORDER — ACETAMINOPHEN 650 MG RE SUPP: 650.0000 mg | Freq: Four times a day (QID) | RECTAL | Status: DC | PRN

## 2014-11-23 MED ORDER — ONDANSETRON HCL 4 MG/2ML IJ SOLN: 4.0000 mg | Freq: Four times a day (QID) | INTRAMUSCULAR | Status: DC | PRN

## 2014-11-23 MED ORDER — SODIUM CHLORIDE 0.9 % IV SOLN
INTRAVENOUS | Status: DC
  Administered 2014-11-23: 20:00:00 via INTRAVENOUS

## 2014-11-23 MED ORDER — ONDANSETRON HCL 4 MG/2ML IJ SOLN
4.0000 mg | Freq: Once | INTRAMUSCULAR | Status: AC
  Administered 2014-11-23: 4 mg via INTRAVENOUS
  Filled 2014-11-23: qty 2

## 2014-11-23 MED ORDER — ONDANSETRON HCL 4 MG PO TABS: 4.0000 mg | ORAL_TABLET | Freq: Four times a day (QID) | ORAL | Status: DC | PRN

## 2014-11-23 MED ORDER — HYDROMORPHONE HCL 1 MG/ML IJ SOLN
1.0000 mg | INTRAMUSCULAR | Status: DC | PRN
  Administered 2014-11-23: 2 mg via INTRAVENOUS
  Filled 2014-11-23: qty 2

## 2014-11-23 MED ORDER — HYDROXYUREA 500 MG PO CAPS
500.0000 mg | ORAL_CAPSULE | Freq: Every day | ORAL | Status: DC
  Administered 2014-11-24: 500 mg via ORAL
  Filled 2014-11-23: qty 1

## 2014-11-23 MED ORDER — FOLIC ACID 1 MG PO TABS
1.0000 mg | ORAL_TABLET | Freq: Every day | ORAL | Status: DC
  Administered 2014-11-24: 1 mg via ORAL
  Filled 2014-11-23: qty 1

## 2014-11-23 MED ORDER — MORPHINE SULFATE ER 30 MG PO TBCR
30.0000 mg | EXTENDED_RELEASE_TABLET | Freq: Two times a day (BID) | ORAL | Status: DC
  Administered 2014-11-23 – 2014-11-24 (×2): 30 mg via ORAL
  Filled 2014-11-23 (×2): qty 1

## 2014-11-23 MED ORDER — ENOXAPARIN SODIUM 40 MG/0.4ML ~~LOC~~ SOLN
40.0000 mg | SUBCUTANEOUS | Status: DC
  Filled 2014-11-23: qty 0.4

## 2014-11-23 MED ORDER — SODIUM CHLORIDE 0.9 % IV BOLUS (SEPSIS)
1000.0000 mL | Freq: Once | INTRAVENOUS | Status: AC
  Administered 2014-11-23: 1000 mL via INTRAVENOUS

## 2014-11-23 MED ORDER — OXYCODONE HCL 5 MG PO TABS
10.0000 mg | ORAL_TABLET | Freq: Four times a day (QID) | ORAL | Status: DC | PRN
  Administered 2014-11-23: 10 mg via ORAL
  Filled 2014-11-23: qty 2

## 2014-11-23 NOTE — ED Notes (Signed)
Bed: WA02 Expected date: 11/23/14 Expected time: 12:01 PM Means of arrival:  Comments: EMS male sickle cell pain.
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
## 2014-11-23 NOTE — ED Notes (Signed)
Generalized pain, has been seen x3 days for this. Has not taken anything for pain. Denies any n/v.
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
## 2014-11-23 NOTE — ED Notes (Signed)
Pt A&O x4, respirations even and unlabored, skin warm and dry, pt is talking on cellphone, in NAD, eating graham crackers and tolerating PO fluids. Will continue to monitor.

## 2014-11-23 NOTE — ED Notes (Signed)
Nurse drawing labs. 

## 2014-11-23 NOTE — ED Notes (Signed)
Attempted to draw blood only able to only able to obtain 1cc.
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
## 2014-11-23 NOTE — ED Notes (Signed)
Patient not in room upon returning to review d/c instructions. IV catheter on bed, intact.

## 2014-11-23 NOTE — ED Notes (Signed)
Pt seen one hour ago for same complaint. States "they were going to admit me, I thought I could handle my pain at home, and I just want to come back and see if I could be admitted in the hospital".

## 2014-11-23 NOTE — Discharge Instructions (Signed)
Please follow with your primary care doctor in the next 2 days for a check-up. They must obtain records for further management.  ° °Do not hesitate to return to the Emergency Department for any new, worsening or concerning symptoms.  ° ° °Sickle Cell Anemia, Adult °Sickle cell anemia is a condition in which red blood cells have an abnormal "sickle" shape. This abnormal shape shortens the cells' life span, which results in a lower than normal concentration of red blood cells in the blood. The sickle shape also causes the cells to clump together and block free blood flow through the blood vessels. As a result, the tissues and organs of the body do not receive enough oxygen. Sickle cell anemia causes organ damage and pain and increases the risk of infection. °CAUSES  °Sickle cell anemia is a genetic disorder. Those who receive two copies of the gene have the condition, and those who receive one copy have the trait. °RISK FACTORS °The sickle cell gene is most common in people whose families originated in [place]. Other areas of the globe where sickle cell trait occurs include the [place], [place] and [place], the [place], and the [place].  °SIGNS AND SYMPTOMS °· Pain, especially in the extremities, back, chest, or abdomen (common). The pain may start suddenly or may develop following an illness, especially if there is dehydration. Pain can also occur due to overexertion or exposure to extreme temperature changes. °· Frequent severe bacterial infections, especially certain types of pneumonia and meningitis. °· Pain and swelling in the hands and feet. °· Decreased activity.   °· Loss of appetite.   °· Change in behavior. °· Headaches. °· Seizures. °· Shortness of breath or difficulty breathing. °· Vision changes. °· Skin ulcers. °Those with the trait may not have symptoms or they may have mild symptoms.  °DIAGNOSIS  °Sickle cell anemia is diagnosed with blood tests that demonstrate the genetic trait. It  is often diagnosed during the newborn period, due to mandatory testing nationwide. A variety of blood tests, X-rays, CT scans, MRI scans, ultrasounds, and lung function tests may also be done to monitor the condition. °TREATMENT  °Sickle cell anemia may be treated with: °· Medicines. You may be given pain medicines, antibiotic medicines (to treat and prevent infections) or medicines to increase the production of certain types of hemoglobin. °· Fluids. °· Oxygen. °· Blood transfusions. °HOME CARE INSTRUCTIONS  °· Drink enough fluid to keep your urine clear or pale yellow. Increase your fluid intake in hot weather and during exercise. °· Do not smoke. Smoking lowers oxygen levels in the blood.   °· Only take over-the-counter or prescription medicines for pain, fever, or discomfort as directed by your health care provider. °· Take antibiotics as directed by your health care provider. Make sure you finish them it even if you start to feel better.   °· Take supplements as directed by your health care provider.   °· Consider wearing a medical alert bracelet. This tells anyone caring for you in an emergency of your condition.   °· When traveling, keep your medical information, health care provider's names, and the medicines you take with you at all times.   °· If you develop a fever, do not take medicines to reduce the fever right away. This could cover up a problem that is developing. Notify your health care provider. °· Keep all follow-up appointments with your health care provider. Sickle cell anemia requires regular medical care. °SEEK MEDICAL CARE IF: ° You have a fever. °SEEK IMMEDIATE MEDICAL CARE IF:  °·   You feel dizzy or faint.   °· You have new abdominal pain, especially on the left side near the stomach area.   °· You develop a persistent, often uncomfortable and painful penile erection (priapism). If this is not treated immediately it will lead to impotence.   °· You have numbness your arms or legs or you have  a hard time moving them.   °· You have a hard time with speech.   °· You have a fever or persistent symptoms for more than 2-3 days.   °· You have a fever and your symptoms suddenly get worse.   °· You have signs or symptoms of infection. These include:   °¨ Chills.   °¨ Abnormal tiredness (lethargy).   °¨ Irritability.   °¨ Poor eating.   °¨ Vomiting.   °· You develop pain that is not helped with medicine.   °· You develop shortness of breath. °· You have pain in your chest.   °· You are coughing up pus-like or bloody sputum.   °· You develop a stiff neck. °· Your feet or hands swell or have pain. °· Your abdomen appears bloated. °· You develop joint pain. °MAKE SURE YOU: °· Understand these instructions. °  °This information is not intended to replace advice given to you by your health care provider. Make sure you discuss any questions you have with your health care provider. °  °Document Released: 05/06/2005 Document Revised: 02/16/2014 Document Reviewed: 09/07/2012 °Elsevier Interactive Patient Education ©2016 [employer] ° °

## 2014-11-23 NOTE — Progress Notes (Signed)
This ED CM has seen pt x 2 within the last 4 days Pt has been given sickle cell and uninsured resources Encouraged to go online or to DSS for assistance [facility] [place] an [facility] just on 11/22/14  Cm spoke with EDP and ED PA/NP about this

## 2014-11-24 DIAGNOSIS — D5700 Hb-SS disease with crisis, unspecified: Principal | ICD-10-CM

## 2014-11-24 MED ORDER — OXYCODONE HCL 5 MG PO TABS
15.0000 mg | ORAL_TABLET | ORAL | Status: DC
  Administered 2014-11-24: 15 mg via ORAL
  Filled 2014-11-24: qty 3

## 2014-11-24 NOTE — Discharge Summary (Signed)
Sickle-Cell Discharge Summary  Matthew Shannon ZOX:[id number] DOB: August 18, 1990 DOA: 11/23/2014  PCP: Jeanann Lewandowsky, MD  Admit date: 11/23/2014 Discharge date: 11/24/2014  Discharge Diagnoses:  Active Problems:   Sickle cell pain crisis (HCC)   Sickle cell crisis (HCC)   Discharge Condition: stable, improved  Disposition: home Follow-up Information    Follow up with Jeanann Lewandowsky, MD In 2 days.   Specialty:  Internal Medicine   Contact information:   [address] [place] [address] [phone]       Diet:regular  Wt Readings from Last 3 Encounters:  11/23/14 134 lb 1.6 oz (60.827 kg)  11/21/14 135 lb (61.236 kg)  11/17/14 131 lb 3.2 oz (59.512 kg)    History of present illness:  Per Chart: Matthew Shannon is a 24 y.o. male with h/o sickle cell anemia , presents twice int he last 24 hours for worsening pain in the back and lower extremities. In the ED, he received IV dilaudid and said his pain improved and left the ED, he returned back in a couple of hours saying the pain is back. He denies any chest pain, sob, cough or nausea or vomiting. He was referred to medical service for further evaluation.    Hospital Course:  Sickle Cell Crisis: Patient presented with pain characteristic of acute vaso-occlusive crisis.Pt's pain was treated with bolus IV Dilaudid and IV fluids. On the morning of discharge, he reported feeling very little pain that he rated as a 3/10. He requested discharge home. He was given Oxycodone  PO and was monitored to see if pain would be manageable at home. He continued to do well with just PO oxycodone. Pt stated that he needed a refill on oxycodone. He reported running out of medication and that being the reason why he came to the hospital. [facility] to [facility], pt filled a prescription for 60 tabs of oxycodone on 11/18/14. When given this information, pt stated that he paid $50 for half of the prescription as his [facility] doesn't  cover his medicines here. He didn't think the pharmacy will fill the rest and requested a prescription. I then discussed with the pt that if it was a partial fill, they should be able to fill the rest. Pt then insisted on still having an on-hand prescription "just in case." I then called the [facility] where he had his last prescription filled to ensure there wouldn't be any problems in him getting his medications. I was told by the pharmacist that the pt filled his full prescription of 60 tabs for $31 using a discount prescription card on 11/18/14. Pt was notified that he will not be given a new prescription, as he should have enough medication available and if there is any problems of what was filled that he would need to contact the pharmacy where his last prescription was filled. Pt had overall improvement of his pain and was physically functional upon discharge.  Discharge Exam: Filed Vitals:   11/24/14 0920  BP: 102/66  Pulse: 71  Temp: 98.3 F (36.8 C)  Resp: 16   Filed Vitals:   11/23/14 1920 11/24/14 0116 11/24/14 0451 11/24/14 0920  BP: 110/64 107/69 103/63 102/66  Pulse: 73 77 73 71  Temp: 97.8 F (36.6 C) 97.7 F (36.5 C) 97.5 F (36.4 C) 98.3 F (36.8 C)  TempSrc: Oral Oral Oral Oral  Resp: Height:  (1.778 m)     Weight: 134 lb 1.6 oz (60.827 kg)  SpO2: 100% 100% 100% 98%   General: Alert, awake, oriented x3, in no acute distress.  HEENT: [place]/AT PEERL, EOMI Neck: Trachea midline,  no masses or lymphadenopathy OROPHARYNX:  Moist, No exudate/ erythema/lesions.  Heart: Regular rate and rhythm, without murmurs, rubs, gallops Lungs: Clear to auscultation, no wheezing or rhonchi noted. No increased vocal fremitus resonant to percussion  Abdomen: Soft, nontender, nondistended, positive bowel sounds, no masses no hepatosplenomegaly noted.  Neuro: No focal neurological deficits noted cranial nerves II through XII grossly intact. Strength 5 out of 5 in bilateral  upper and lower extremities. Musculoskeletal: No warm swelling or erythema around joints, no spinal tenderness noted. Psychiatric: Patient alert and oriented x3, good insight and cognition, good recent to remote recall. Lymph node survey: No cervical axillary or inguinal lymphadenopathy noted. Extremities: no clubbing, cyanosis, or edema  Discharge Instructions As above    Medication List    TAKE these medications        clindamycin 300 MG capsule  Commonly known as:  CLEOCIN  Take 1 capsule (300 mg total) by mouth 3 (three) times daily.     folic acid 1 MG tablet  Commonly known as:  FOLVITE  Take 1 tablet (1 mg total) by mouth daily.     GOODY HEADACHE PO  Take 1 packet by mouth 2 (two) times daily as needed (headache).     hydroxyurea 500 MG capsule  Commonly known as:  HYDREA  Take 1 capsule (500 mg total) by mouth daily. May take with food to minimize GI side effects.     morphine 30 MG 12 hr tablet  Commonly known as:  MS CONTIN  Take 1 tablet (30 mg total) by mouth every 12 (twelve) hours.     ondansetron 8 MG tablet  Commonly known as:  ZOFRAN  Take 1 tablet (8 mg total) by mouth every 8 (eight) hours as needed for nausea or vomiting.     Oxycodone HCl 10 MG Tabs  Take 1 tablet (10 mg total) by mouth every 4 (four) hours.          The results of significant diagnostics from this hospitalization (including imaging, microbiology, ancillary and laboratory) are listed below for reference.    Significant Diagnostic Studies: Dg Chest 2 View  11/16/2014  CLINICAL DATA:  Chest pain. EXAM: CHEST  2 VIEW COMPARISON:  November 10, 2014. FINDINGS: Stable cardiomediastinal silhouette. Both lungs are clear. No pneumothorax or pleural effusion is noted. Moderate dextroscoliosis of lower thoracic spine is again noted. IMPRESSION: No active cardiopulmonary disease. Electronically Signed   By: Lupita Raider, M.D.   On: 11/16/2014 18:11   Dg Chest 2 View  11/10/2014  CLINICAL  DATA:  Sickle cell related back and leg pain, generalized weakness and shortness of breath. Cough for few days. Smoker. EXAM: CHEST  2 VIEW COMPARISON:  Chest radiograph November 07, 2014 FINDINGS: Cardiac silhouette is mildly enlarged. Mediastinal silhouette is nonsuspicious. Mild interstitial prominence in the lung bases is similar without pleural effusion or focal consolidation. No pneumothorax. Lower thoracic broad dextroscoliosis, fish type vertebra. Surgical clips in the included right abdomen compatible with cholecystectomy. IMPRESSION: Mild cardiomegaly. Mild interstitial changes in the lung bases favor atelectasis or scarring. No focal consolidation. Electronically Signed   By: Awilda Metro M.D.   On: 11/10/2014 01:13   Dg Chest 2 View  11/07/2014  CLINICAL DATA:  Sickle cell crisis. Low back and bilateral lower extremity pain. Smoker EXAM: CHEST  2 VIEW COMPARISON:  10/06/2014 chest radiograph. FINDINGS: Stable cardiomediastinal silhouette with mild cardiomegaly. No pneumothorax. No pleural effusion. The previously described left upper lobe patchy opacity has largely resolved. Mild reticular opacities at both lung bases are not appreciably changed. No new focal lung consolidation. Endplate changes characteristic of sickle cell disease are seen throughout the thoracolumbar spine vertebral bodies, unchanged. Surgical clips are noted in the right upper quadrant. IMPRESSION: 1. Largely resolved left upper lobe opacity, in keeping with resolving pneumonia. 2. No new focal lung consolidation to suggest a pneumonia. 3. Stable mild reticular opacities at both lung bases, favor scarring or atelectasis. Electronically Signed   By: Delbert PhenixJason A Poff M.D.   On: 11/07/2014 17:35   Ct Maxillofacial W/cm  11/16/2014  CLINICAL DATA:  Left facial pain and swelling.  Left molar pain. EXAM: CT MAXILLOFACIAL WITH CONTRAST TECHNIQUE: Multidetector CT imaging of the maxillofacial structures was performed with intravenous  contrast. Multiplanar CT image reconstructions were also generated. A small metallic BB was placed on the right temple in order to reliably differentiate right from left. CONTRAST:  100mL OMNIPAQUE IOHEXOL 300 MG/ML  SOLN COMPARISON:  None. FINDINGS: The bones in general show expansion consistent with the clinical diagnosis of sickle cell disease. There is advanced dental decay throughout the maxillary and mandibular dentition. This is advanced in multiple locations. Radicular cysts/root abscesses are present at the left mandibular molars and to a lesser extent the right mandibular molars. Pronounced inflammation of the left face is present, but I do not identify a drainable abscess. No extension into the deep spaces of the face or upper neck. IMPRESSION: Marrow changes consistent with sickle cell disease. Advanced widespread dental decay. Radicular cyst/root abscesses of the left mandibular molars with pronounced inflammatory changes of the left face but no evidence of drainable fluid density abscess. Inflammation appears phlegmonous. Electronically Signed   By: Paulina FusiMark  Shogry M.D.   On: 11/16/2014 21:15    Microbiology: No results found for this or any previous visit (from the past 240 hour(s)).   Labs: Basic Metabolic Panel:  Recent Labs Lab 11/20/14 1919 11/21/14 1708 11/23/14 1313  NA 139 145 140  K 4.0 4.1 4.6  CL 105 107 108  CO2 26  --  23  GLUCOSE 79 117* 79  BUN 8 8 7   CREATININE 0.41* 0.50* 0.59*  CALCIUM 9.7  --  9.3   Liver Function Tests:  Recent Labs Lab 11/20/14 1919 11/23/14 1313  AST 43* 29  ALT 23 18  ALKPHOS 91 81  BILITOT 5.4* 2.8*  PROT 8.7* 7.8  ALBUMIN 4.8 4.3   No results for input(s): LIPASE, AMYLASE in the last 168 hours. No results for input(s): AMMONIA in the last 168 hours. CBC:  Recent Labs Lab 11/20/14 1919 11/21/14 1700 11/21/14 1708 11/23/14 1313  WBC 14.4* 19.0*  --  18.0*  NEUTROABS 7.6  --   --  11.8*  HGB 7.7* 7.0* 8.5* 8.2*  HCT  22.2* 20.1* 25.0* 24.1*  MCV 89.9 90.5  --  94.1  PLT 266 279  --  352   Cardiac Enzymes:  Recent Labs Lab 11/17/14 1451 11/17/14 2235  TROPONINI <0.03 <0.03   BNP: Invalid input(s): POCBNP CBG: No results for input(s): GLUCAP in the last 168 hours.  Time coordinating discharge: >30 min  Signed:  Serina CowperAGBOOLA, Jeannette Maddy Sickle Cell Service 11/24/2014, 12:18 PM

## 2014-11-24 NOTE — Discharge Instructions (Signed)
Sickle Cell Anemia, Adult Sickle cell anemia is a condition in which red blood cells have an abnormal "sickle" shape. This abnormal shape shortens the cells' life span, which results in a lower than normal concentration of red blood cells in the blood. The sickle shape also causes the cells to clump together and block free blood flow through the blood vessels. As a result, the tissues and organs of the body do not receive enough oxygen. Sickle cell anemia causes organ damage and pain and increases the risk of infection. CAUSES  Sickle cell anemia is a genetic disorder. Those who receive two copies of the gene have the condition, and those who receive one copy have the trait. RISK FACTORS The sickle cell gene is most common in people whose families originated in [place]. Other areas of the globe where sickle cell trait occurs include the Mediterranean, [place] and [place], the [place], and the [place].  SIGNS AND SYMPTOMS  Pain, especially in the extremities, back, chest, or abdomen (common). The pain may start suddenly or may develop following an illness, especially if there is dehydration. Pain can also occur due to overexertion or exposure to extreme temperature changes.  Frequent severe bacterial infections, especially certain types of pneumonia and meningitis.  Pain and swelling in the hands and feet.  Decreased activity.   Loss of appetite.   Change in behavior.  Headaches.  Seizures.  Shortness of breath or difficulty breathing.  Vision changes.  Skin ulcers. Those with the trait may not have symptoms or they may have mild symptoms.  DIAGNOSIS  Sickle cell anemia is diagnosed with blood tests that demonstrate the genetic trait. It is often diagnosed during the newborn period, due to mandatory testing nationwide. A variety of blood tests, X-rays, CT scans, MRI scans, ultrasounds, and lung function tests may also be done to monitor the condition. TREATMENT  Sickle  cell anemia may be treated with:  Medicines. You may be given pain medicines, antibiotic medicines (to treat and prevent infections) or medicines to increase the production of certain types of hemoglobin.  Fluids.  Oxygen.  Blood transfusions. HOME CARE INSTRUCTIONS   Drink enough fluid to keep your urine clear or pale yellow. Increase your fluid intake in hot weather and during exercise.  Do not smoke. Smoking lowers oxygen levels in the blood.   Only take over-the-counter or prescription medicines for pain, fever, or discomfort as directed by your health care provider.  Take antibiotics as directed by your health care provider. Make sure you finish them it even if you start to feel better.   Take supplements as directed by your health care provider.   Consider wearing a medical alert bracelet. This tells anyone caring for you in an emergency of your condition.   When traveling, keep your medical information, health care provider's names, and the medicines you take with you at all times.   If you develop a fever, do not take medicines to reduce the fever right away. This could cover up a problem that is developing. Notify your health care provider.  Keep all follow-up appointments with your health care provider. Sickle cell anemia requires regular medical care. SEEK MEDICAL CARE IF: You have a fever. SEEK IMMEDIATE MEDICAL CARE IF:   You feel dizzy or faint.   You have new abdominal pain, especially on the left side near the stomach area.   You develop a persistent, often uncomfortable and painful penile erection (priapism). If this is not treated immediately it   will lead to impotence.   You have numbness your arms or legs or you have a hard time moving them.   You have a hard time with speech.   You have a fever or persistent symptoms for more than 2-3 days.   You have a fever and your symptoms suddenly get worse.   You have signs or symptoms of infection.  These include:   Chills.   Abnormal tiredness (lethargy).   Irritability.   Poor eating.   Vomiting.   You develop pain that is not helped with medicine.   You develop shortness of breath.  You have pain in your chest.   You are coughing up pus-like or bloody sputum.   You develop a stiff neck.  Your feet or hands swell or have pain.  Your abdomen appears bloated.  You develop joint pain. MAKE SURE YOU:  Understand these instructions.   This information is not intended to replace advice given to you by your health care provider. Make sure you discuss any questions you have with your health care provider.   Document Released: 05/06/2005 Document Revised: 02/16/2014 Document Reviewed: 09/07/2012 Elsevier Interactive Patient Education 2016 Elsevier Inc.  

## 2014-11-24 NOTE — Progress Notes (Signed)
Patient witnessed coming back to the floor off the elevator.  Patient disconnected himself from the IV, got dressed, and was witnessed smoking at the emergency room by security.  MD paged.  Will continue to monitor.

## 2014-11-26 ENCOUNTER — Encounter (HOSPITAL_COMMUNITY): Payer: Self-pay | Admitting: *Deleted

## 2014-11-26 ENCOUNTER — Emergency Department (HOSPITAL_COMMUNITY)
Admission: EM | Admit: 2014-11-26 | Discharge: 2014-11-26 | Disposition: A | Discharge status: Home / Self Care | Payer: Medicaid - Out of State | Attending: Emergency Medicine | Admitting: Emergency Medicine

## 2014-11-26 DIAGNOSIS — Z79899 Other long term (current) drug therapy: Secondary | ICD-10-CM | POA: Diagnosis not present

## 2014-11-26 DIAGNOSIS — Z720 Tobacco use: Secondary | ICD-10-CM | POA: Diagnosis not present

## 2014-11-26 DIAGNOSIS — D5700 Hb-SS disease with crisis, unspecified: Secondary | ICD-10-CM | POA: Insufficient documentation

## 2014-11-26 LAB — CBC WITH DIFFERENTIAL/PLATELET
BASOS ABS: 0.2 10*3/uL — AB (ref 0.0–0.1)
Basophils Relative: 1 %
EOS PCT: 3 %
Eosinophils Absolute: 0.5 10*3/uL (ref 0.0–0.7)
HEMATOCRIT: 22 % — AB (ref 39.0–52.0)
HEMOGLOBIN: 7.6 g/dL — AB (ref 13.0–17.0)
LYMPHS PCT: 27 %
Lymphs Abs: 4.1 10*3/uL — ABNORMAL HIGH (ref 0.7–4.0)
MCH: 31.1 pg (ref 26.0–34.0)
MCHC: 34.5 g/dL (ref 30.0–36.0)
MCV: 90.2 fL (ref 78.0–100.0)
MONOS PCT: 11 %
Monocytes Absolute: 1.7 10*3/uL — ABNORMAL HIGH (ref 0.1–1.0)
Neutro Abs: 8.8 10*3/uL — ABNORMAL HIGH (ref 1.7–7.7)
Neutrophils Relative %: 58 %
Platelets: 363 10*3/uL (ref 150–400)
RBC: 2.44 MIL/uL — AB (ref 4.22–5.81)
RDW: 22.5 % — ABNORMAL HIGH (ref 11.5–15.5)
WBC: 15.3 10*3/uL — AB (ref 4.0–10.5)

## 2014-11-26 LAB — RETICULOCYTES
RBC.: 2.39 MIL/uL — AB (ref 4.22–5.81)
RETIC COUNT ABSOLUTE: 442.2 10*3/uL — AB (ref 19.0–186.0)
RETIC CT PCT: 18.5 % — AB (ref 0.4–3.1)

## 2014-11-26 LAB — COMPREHENSIVE METABOLIC PANEL
ALK PHOS: 80 U/L (ref 38–126)
ALT: 10 U/L — ABNORMAL LOW (ref 17–63)
ANION GAP: 6 (ref 5–15)
AST: 21 U/L (ref 15–41)
Albumin: 4.6 g/dL (ref 3.5–5.0)
BILIRUBIN TOTAL: 4.3 mg/dL — AB (ref 0.3–1.2)
BUN: 6 mg/dL (ref 6–20)
CALCIUM: 9.3 mg/dL (ref 8.9–10.3)
CO2: 27 mmol/L (ref 22–32)
CREATININE: 0.48 mg/dL — AB (ref 0.61–1.24)
Chloride: 107 mmol/L (ref 101–111)
GLUCOSE: 100 mg/dL — AB (ref 65–99)
Potassium: 3.8 mmol/L (ref 3.5–5.1)
SODIUM: 140 mmol/L (ref 135–145)
Total Protein: 8.1 g/dL (ref 6.5–8.1)

## 2014-11-26 MED ORDER — HYDROMORPHONE HCL 2 MG/ML IJ SOLN
0.0313 mg/kg | INTRAMUSCULAR | Status: AC
  Administered 2014-11-26: 1.8 mg via INTRAVENOUS

## 2014-11-26 MED ORDER — HYDROMORPHONE HCL 2 MG/ML IJ SOLN: 0.0375 mg/kg | INTRAMUSCULAR | Status: AC

## 2014-11-26 MED ORDER — HYDROMORPHONE HCL 2 MG/ML IJ SOLN: 0.0313 mg/kg | INTRAMUSCULAR | Status: AC

## 2014-11-26 MED ORDER — DIPHENHYDRAMINE HCL 25 MG PO CAPS
25.0000 mg | ORAL_CAPSULE | ORAL | Status: DC | PRN
  Administered 2014-11-26: 25 mg via ORAL
  Filled 2014-11-26 (×2): qty 1

## 2014-11-26 MED ORDER — OXYCODONE-ACETAMINOPHEN 5-325 MG PO TABS
2.0000 | ORAL_TABLET | Freq: Once | ORAL | Status: AC
  Administered 2014-11-26: 2 via ORAL
  Filled 2014-11-26: qty 2

## 2014-11-26 MED ORDER — HYDROMORPHONE HCL 2 MG/ML IJ SOLN
0.0250 mg/kg | INTRAMUSCULAR | Status: AC
  Administered 2014-11-26: 1.5 mg via INTRAVENOUS
  Filled 2014-11-26: qty 1

## 2014-11-26 MED ORDER — SODIUM CHLORIDE 0.9 % IV SOLN
Freq: Once | INTRAVENOUS | Status: AC
  Administered 2014-11-26: 18:00:00 via INTRAVENOUS

## 2014-11-26 MED ORDER — HYDROMORPHONE HCL 2 MG/ML IJ SOLN
0.0250 mg/kg | INTRAMUSCULAR | Status: AC
  Filled 2014-11-26: qty 1

## 2014-11-26 MED ORDER — HYDROMORPHONE HCL 2 MG/ML IJ SOLN
0.0375 mg/kg | INTRAMUSCULAR | Status: AC
Start: 2014-11-26 — End: 2014-11-26
  Administered 2014-11-26: 2.1 mg via INTRAVENOUS
  Filled 2014-11-26: qty 2
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
## 2014-11-26 NOTE — Progress Notes (Signed)
Patient noted to have been seen in the ED 24 times in six months with 7 admissions.  Patient recently admitted and discharged from 10/14 to 10/15.  EDCM spoke to patient at bedside.  EDCM asked patient what changed in hios symptoms since his discharge from the hospital?  Patient reports "Nothing has changed, I still feel the same since the day I was discharged."  Patient reports there are times when his pain is at a zero.  Patient reports when his pain level is at a 3-4, he can tolerate it.  [facility] when his pain begins at home, what does he do?  Patient reports he tries to manage it by himself, if he is unable too, he will call the [facility] and ask if there is any room there, if not, he comes to the ED.  [facility] informed [facility] of concerns of patient coming to ED 24 times within the last six months and if there was anything going on at home that was causing his sickle cell to flair?  Patient reports the change in weather affects his symptoms.  Patient reports he would much rather be at home with his two boys.  [facility] 130[facility] and placed appointment reminder on his bed side table.  Patient thankful for services.  No fruther EDCM needs at this time.

## 2014-11-26 NOTE — Discharge Instructions (Signed)
Return to the ED with any concerns including difficulty breathing, abdominal pain, fever/chills, chest pain, fainting, decreased level of alertness/lethargy, or any other alarming symptoms

## 2014-11-26 NOTE — ED Notes (Signed)
Per EMS, pt from home, reports sickle cell crisis x 2 days.  C/o back and bila leg pain.

## 2014-11-26 NOTE — ED Notes (Signed)
[facility] Expected date:  Expected time:  Means of arrival:  Comments: Triage [place]

## 2014-11-26 NOTE — ED Notes (Signed)
Bed: WA22 Expected date:  Expected time:  Means of arrival:  Comments: [facility] 

## 2014-11-27 ENCOUNTER — Emergency Department (HOSPITAL_COMMUNITY): Payer: Medicaid - Out of State

## 2014-11-27 ENCOUNTER — Encounter (HOSPITAL_COMMUNITY): Payer: Self-pay | Admitting: Emergency Medicine

## 2014-11-27 ENCOUNTER — Emergency Department (HOSPITAL_COMMUNITY)
Admission: EM | Admit: 2014-11-27 | Discharge: 2014-11-27 | Disposition: A | Discharge status: Home / Self Care | Payer: Medicaid - Out of State | Source: Home / Self Care | Attending: Emergency Medicine | Admitting: Emergency Medicine

## 2014-11-27 DIAGNOSIS — D5700 Hb-SS disease with crisis, unspecified: Secondary | ICD-10-CM

## 2014-11-27 DIAGNOSIS — G8929 Other chronic pain: Secondary | ICD-10-CM

## 2014-11-27 LAB — CBC WITH DIFFERENTIAL/PLATELET
BASOS PCT: 1 %
Basophils Absolute: 0.2 10*3/uL — ABNORMAL HIGH (ref 0.0–0.1)
Basophils Absolute: 0 10*3/uL (ref 0.0–0.1)
Basophils Relative: 0 %
EOS ABS: 0.7 10*3/uL (ref 0.0–0.7)
EOS ABS: 0.4 10*3/uL (ref 0.0–0.7)
EOS PCT: 4 %
Eosinophils Relative: 3 %
HCT: 23 % — ABNORMAL LOW (ref 39.0–52.0)
HEMATOCRIT: 22.9 % — AB (ref 39.0–52.0)
HEMOGLOBIN: 8 g/dL — AB (ref 13.0–17.0)
Hemoglobin: 7.8 g/dL — ABNORMAL LOW (ref 13.0–17.0)
LYMPHS PCT: 26 %
LYMPHS PCT: 22 %
Lymphs Abs: 4.5 10*3/uL — ABNORMAL HIGH (ref 0.7–4.0)
Lymphs Abs: 2.8 10*3/uL (ref 0.7–4.0)
MCH: 31.7 pg (ref 26.0–34.0)
MCH: 31.3 pg (ref 26.0–34.0)
MCHC: 34.8 g/dL (ref 30.0–36.0)
MCHC: 34.1 g/dL (ref 30.0–36.0)
MCV: 91.3 fL (ref 78.0–100.0)
MCV: 92 fL (ref 78.0–100.0)
MONOS PCT: 10 %
MONOS PCT: 12 %
Monocytes Absolute: 1.7 10*3/uL — ABNORMAL HIGH (ref 0.1–1.0)
Monocytes Absolute: 1.5 10*3/uL — ABNORMAL HIGH (ref 0.1–1.0)
NEUTROS ABS: 7.8 10*3/uL — AB (ref 1.7–7.7)
NEUTROS PCT: 59 %
NEUTROS PCT: 63 %
Neutro Abs: 10.3 10*3/uL — ABNORMAL HIGH (ref 1.7–7.7)
Platelets: 361 10*3/uL (ref 150–400)
Platelets: 376 10*3/uL (ref 150–400)
RBC: 2.52 MIL/uL — ABNORMAL LOW (ref 4.22–5.81)
RBC: 2.49 MIL/uL — AB (ref 4.22–5.81)
RDW: 22.1 % — ABNORMAL HIGH (ref 11.5–15.5)
RDW: 22.3 % — AB (ref 11.5–15.5)
WBC: 17.4 10*3/uL — ABNORMAL HIGH (ref 4.0–10.5)
WBC: 12.5 10*3/uL — ABNORMAL HIGH (ref 4.0–10.5)

## 2014-11-27 LAB — RETICULOCYTES
RBC.: 2.49 MIL/uL — ABNORMAL LOW (ref 4.22–5.81)
Retic Count, Absolute: 453.2 10*3/uL — ABNORMAL HIGH (ref 19.0–186.0)
Retic Ct Pct: 18.2 % — ABNORMAL HIGH (ref 0.4–3.1)

## 2014-11-27 LAB — COMPREHENSIVE METABOLIC PANEL
ALBUMIN: 4.4 g/dL (ref 3.5–5.0)
ALK PHOS: 82 U/L (ref 38–126)
ALT: 12 U/L — ABNORMAL LOW (ref 17–63)
ALT: 10 U/L — ABNORMAL LOW (ref 17–63)
ANION GAP: 10 (ref 5–15)
ANION GAP: 5 (ref 5–15)
AST: 23 U/L (ref 15–41)
AST: 21 U/L (ref 15–41)
Albumin: 4.1 g/dL (ref 3.5–5.0)
Alkaline Phosphatase: 84 U/L (ref 38–126)
BILIRUBIN TOTAL: 3.7 mg/dL — AB (ref 0.3–1.2)
BUN: <5 mg/dL — ABNORMAL LOW (ref 6–20)
BUN: <5 mg/dL — ABNORMAL LOW (ref 6–20)
CALCIUM: 9.2 mg/dL (ref 8.9–10.3)
CHLORIDE: 105 mmol/L (ref 101–111)
CO2: 24 mmol/L (ref 22–32)
CO2: 27 mmol/L (ref 22–32)
Calcium: 9.3 mg/dL (ref 8.9–10.3)
Chloride: 108 mmol/L (ref 101–111)
Creatinine, Ser: 0.57 mg/dL — ABNORMAL LOW (ref 0.61–1.24)
Creatinine, Ser: 0.41 mg/dL — ABNORMAL LOW (ref 0.61–1.24)
GFR calc non Af Amer: >60 mL/min (ref >60)
GFR calc non Af Amer: >60 mL/min (ref >60)
GLUCOSE: 98 mg/dL (ref 65–99)
Glucose, Bld: 125 mg/dL — ABNORMAL HIGH (ref 65–99)
POTASSIUM: 3.2 mmol/L — AB (ref 3.5–5.1)
POTASSIUM: 3.5 mmol/L (ref 3.5–5.1)
SODIUM: 139 mmol/L (ref 135–145)
SODIUM: 140 mmol/L (ref 135–145)
TOTAL PROTEIN: 7.9 g/dL (ref 6.5–8.1)
Total Bilirubin: 3.8 mg/dL — ABNORMAL HIGH (ref 0.3–1.2)
Total Protein: 7.4 g/dL (ref 6.5–8.1)

## 2014-11-27 LAB — I-STAT TROPONIN, ED: Troponin i, poc: 0 ng/mL (ref 0.00–0.08)

## 2014-11-27 LAB — URINALYSIS, ROUTINE W REFLEX MICROSCOPIC
Bilirubin Urine: NEGATIVE
Glucose, UA: NEGATIVE mg/dL
Hgb urine dipstick: NEGATIVE
Ketones, ur: NEGATIVE mg/dL
LEUKOCYTES UA: NEGATIVE
Nitrite: NEGATIVE
PROTEIN: NEGATIVE mg/dL
Specific Gravity, Urine: 1.007 (ref 1.005–1.030)
UROBILINOGEN UA: 1 mg/dL (ref 0.0–1.0)
pH: 7.5 (ref 5.0–8.0)

## 2014-11-27 MED ORDER — HYDROMORPHONE HCL 1 MG/ML IJ SOLN
1.0000 mg | Freq: Once | INTRAMUSCULAR | Status: AC
  Administered 2014-11-27: 1 mg via INTRAVENOUS
  Filled 2014-11-27: qty 1

## 2014-11-27 MED ORDER — OXYCODONE-ACETAMINOPHEN 5-325 MG PO TABS
2.0000 | ORAL_TABLET | Freq: Once | ORAL | Status: AC
  Administered 2014-11-27: 2 via ORAL
  Filled 2014-11-27: qty 2

## 2014-11-27 MED ORDER — ONDANSETRON HCL 4 MG/2ML IJ SOLN
4.0000 mg | Freq: Once | INTRAMUSCULAR | Status: AC
  Administered 2014-11-27: 4 mg via INTRAVENOUS
  Filled 2014-11-27: qty 2

## 2014-11-27 MED ORDER — DIPHENHYDRAMINE HCL 25 MG PO CAPS
25.0000 mg | ORAL_CAPSULE | Freq: Once | ORAL | Status: AC
Start: 2014-11-27 — End: 2014-11-27
  Administered 2014-11-27: 25 mg via ORAL
  Filled 2014-11-27: qty 1

## 2014-11-27 MED ORDER — SODIUM CHLORIDE 0.9 % IV BOLUS (SEPSIS)
1000.0000 mL | Freq: Once | INTRAVENOUS | Status: AC
  Administered 2014-11-27: 1000 mL via INTRAVENOUS

## 2014-11-27 NOTE — Discharge Instructions (Signed)
Follow up with sickle cell clinic in the morning. Sickle Cell Anemia, Adult Sickle cell anemia is a condition where your red blood cells are shaped like sickles. Red blood cells carry oxygen through the body. Sickle-shaped red blood cells do not live as long as normal red blood cells. They also clump together and block blood from flowing through the blood vessels. These things prevent the body from getting enough oxygen. Sickle cell anemia causes organ damage and pain. It also increases the risk of infection. HOME CARE  Drink enough fluid to keep your pee (urine) clear or pale yellow. Drink more in hot weather and during exercise.  Do not smoke. Smoking lowers oxygen levels in the blood.  Only take over-the-counter or prescription medicines as told by your doctor.  Take antibiotic medicines as told by your doctor. Make sure you finish them even if you start to feel better.  Take supplements as told by your doctor.  Consider wearing a medical alert bracelet. This tells anyone caring for you in an emergency of your condition.  When traveling, keep your medical information, doctors' names, and the medicines you take with you at all times.  If you have a fever, do not take fever medicines right away. This could cover up a problem. Tell your doctor.   Keep all follow-up visits with your doctor. Sickle cell anemia requires regular medical care. GET HELP IF: You have a fever. GET HELP RIGHT AWAY IF:  You feel dizzy or faint.  You have new belly (abdominal) pain, especially on the left side near the stomach area.  You have a lasting, often uncomfortable and painful erection of the penis (priapism). If it is not treated right away, you will become unable to have sex (impotence).  You have numbness in your arms or legs or you have a hard time moving them.  You have a hard time talking.  You have a fever or lasting symptoms for more than 2-3 days.  You have a fever and your symptoms  suddenly get worse.  You have signs or symptoms of infection. These include:  Chills.  Being more tired than normal (lethargy).  Irritability.  Poor eating.  Throwing up (vomiting).  You have pain that is not helped with medicine.  You have shortness of breath.  You have pain in your chest.  You are coughing up pus-like or bloody mucus.  You have a stiff neck.  Your feet or hands swell or have pain.  Your belly looks bloated.  Your joints hurt. MAKE SURE YOU:  Understand these instructions.  Will watch your condition.  Will get help right away if you are not doing well or get worse.   This information is not intended to replace advice given to you by your health care provider. Make sure you discuss any questions you have with your health care provider.   Document Released: 11/16/2012 Document Revised: 06/12/2014 Document Reviewed: 11/16/2012 Elsevier Interactive Patient Education [employer]2016 [employer] Inc.

## 2014-11-27 NOTE — ED Notes (Signed)
Bed: WA09 Expected date:  Expected time:  Means of arrival:  Comments: [facility] center 

## 2014-11-27 NOTE — Progress Notes (Signed)
[facility] Dr. Elisabeth Pigeonevine to discuss admission.  At this time, Dr. Elisabeth Pigeonevine believes the patient does not require admission.  Discussed with EDP.  Patient was instructed to go to Sickle Cell clinic first thing in the am.   [facility] to [facility] at bedside, however the patient was discharged.

## 2014-11-27 NOTE — ED Notes (Signed)
Unable to collect labs patient wants to wait until he gets his IV to be stuck.

## 2014-11-27 NOTE — ED Notes (Signed)
Pt. reports sickle cell pain crisis ; back pain and leg pain onset last week unrelieved by prescription Percocet. Denies fever , respirations unlabored.
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
## 2014-11-27 NOTE — ED Notes (Addendum)
Pt reports non-radiating mid chest pain starting last night. Also c/o back pain starting 2 days ago. Says he was recently seen and was "almost admitted." Referred here today by MD Jegede. Denies SOB. Takes Oxycodone 10 mg at home with no alleviation-took around 0900 this morning. No other c/c. Ambulatory with steady gait. RR even/unlabored. Denies cough/cold symptoms.
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
## 2014-11-29 ENCOUNTER — Ambulatory Visit: Payer: Medicaid - Out of State | Admitting: Family Medicine

## 2014-11-30 ENCOUNTER — Emergency Department (HOSPITAL_COMMUNITY)
Admission: EM | Admit: 2014-11-30 | Discharge: 2014-11-30 | Disposition: A | Discharge status: Home / Self Care | Payer: Medicaid - Out of State | Attending: Emergency Medicine | Admitting: Emergency Medicine

## 2014-11-30 ENCOUNTER — Encounter (HOSPITAL_COMMUNITY): Payer: Self-pay | Admitting: Emergency Medicine

## 2014-11-30 DIAGNOSIS — D5700 Hb-SS disease with crisis, unspecified: Secondary | ICD-10-CM | POA: Diagnosis not present

## 2014-11-30 DIAGNOSIS — Z79899 Other long term (current) drug therapy: Secondary | ICD-10-CM | POA: Insufficient documentation

## 2014-11-30 DIAGNOSIS — Z7982 Long term (current) use of aspirin: Secondary | ICD-10-CM | POA: Diagnosis not present

## 2014-11-30 DIAGNOSIS — Z79891 Long term (current) use of opiate analgesic: Secondary | ICD-10-CM | POA: Insufficient documentation

## 2014-11-30 DIAGNOSIS — Z720 Tobacco use: Secondary | ICD-10-CM | POA: Diagnosis not present

## 2014-11-30 LAB — BASIC METABOLIC PANEL
Anion gap: 5 (ref 5–15)
BUN: 6 mg/dL (ref 6–20)
CALCIUM: 8.9 mg/dL (ref 8.9–10.3)
CO2: 27 mmol/L (ref 22–32)
CREATININE: 0.45 mg/dL — AB (ref 0.61–1.24)
Chloride: 110 mmol/L (ref 101–111)
GFR calc non Af Amer: >60 mL/min (ref >60)
GLUCOSE: 89 mg/dL (ref 65–99)
Potassium: 3.2 mmol/L — ABNORMAL LOW (ref 3.5–5.1)
Sodium: 142 mmol/L (ref 135–145)

## 2014-11-30 LAB — CBC WITH DIFFERENTIAL/PLATELET
BASOS PCT: 1 %
Basophils Absolute: 0.1 10*3/uL (ref 0.0–0.1)
EOS ABS: 0.5 10*3/uL (ref 0.0–0.7)
Eosinophils Relative: 5 %
HCT: 26 % — ABNORMAL LOW (ref 39.0–52.0)
HEMOGLOBIN: 9 g/dL — AB (ref 13.0–17.0)
Lymphocytes Relative: 31 %
Lymphs Abs: 3.4 10*3/uL (ref 0.7–4.0)
MCH: 32 pg (ref 26.0–34.0)
MCHC: 34.6 g/dL (ref 30.0–36.0)
MCV: 92.5 fL (ref 78.0–100.0)
MONOS PCT: 10 %
Monocytes Absolute: 1.1 10*3/uL — ABNORMAL HIGH (ref 0.1–1.0)
NEUTROS PCT: 53 %
Neutro Abs: 5.8 10*3/uL (ref 1.7–7.7)
Platelets: 295 10*3/uL (ref 150–400)
RBC: 2.81 MIL/uL — ABNORMAL LOW (ref 4.22–5.81)
RDW: 20.1 % — AB (ref 11.5–15.5)
WBC: 10.9 10*3/uL — ABNORMAL HIGH (ref 4.0–10.5)

## 2014-11-30 LAB — RETICULOCYTES
RBC.: 2.81 MIL/uL — AB (ref 4.22–5.81)
RETIC CT PCT: 14.2 % — AB (ref 0.4–3.1)
Retic Count, Absolute: 399 10*3/uL — ABNORMAL HIGH (ref 19.0–186.0)

## 2014-11-30 MED ORDER — DIPHENHYDRAMINE HCL 50 MG/ML IJ SOLN
25.0000 mg | Freq: Once | INTRAMUSCULAR | Status: AC
  Administered 2014-11-30: 25 mg via INTRAVENOUS
  Filled 2014-11-30: qty 1

## 2014-11-30 MED ORDER — HYDROMORPHONE HCL 2 MG/ML IJ SOLN
0.0375 mg/kg | INTRAMUSCULAR | Status: AC
  Administered 2014-11-30: 2.3 mg via INTRAVENOUS
  Filled 2014-11-30: qty 2

## 2014-11-30 MED ORDER — HYDROMORPHONE HCL 2 MG/ML IJ SOLN: 0.0250 mg/kg | INTRAMUSCULAR | Status: AC

## 2014-11-30 MED ORDER — HYDROMORPHONE HCL 2 MG/ML IJ SOLN
0.0250 mg/kg | INTRAMUSCULAR | Status: AC
  Administered 2014-11-30: 1.5 mg via INTRAVENOUS
  Filled 2014-11-30: qty 1

## 2014-11-30 MED ORDER — KETOROLAC TROMETHAMINE 30 MG/ML IJ SOLN
30.0000 mg | INTRAMUSCULAR | Status: AC
  Administered 2014-11-30: 30 mg via INTRAVENOUS
  Filled 2014-11-30: qty 1

## 2014-11-30 MED ORDER — HYDROMORPHONE HCL 2 MG/ML IJ SOLN
0.0313 mg/kg | INTRAMUSCULAR | Status: AC
  Administered 2014-11-30: 1.9 mg via INTRAVENOUS
  Filled 2014-11-30: qty 1

## 2014-11-30 MED ORDER — HYDROMORPHONE HCL 2 MG/ML IJ SOLN: 0.0313 mg/kg | INTRAMUSCULAR | Status: AC

## 2014-11-30 MED ORDER — HYDROMORPHONE HCL 2 MG/ML IJ SOLN: 0.0375 mg/kg | INTRAMUSCULAR | Status: DC

## 2014-11-30 NOTE — Discharge Instructions (Signed)
Read the information below.  Use the prescribed medication as directed.  Please discuss all new medications with your pharmacist.  You may return to the Emergency Department at any time for worsening condition or any new symptoms that concern you.  Read the information below.  You may return to the Emergency Department at any time for worsening condition or any new symptoms that concern you.   Sickle Cell Anemia, Adult Sickle cell anemia is a condition where your red blood cells are shaped like sickles. Red blood cells carry oxygen through the body. Sickle-shaped red blood cells do not live as long as normal red blood cells. They also clump together and block blood from flowing through the blood vessels. These things prevent the body from getting enough oxygen. Sickle cell anemia causes organ damage and pain. It also increases the risk of infection. HOME CARE  Drink enough fluid to keep your pee (urine) clear or pale yellow. Drink more in hot weather and during exercise.  Do not smoke. Smoking lowers oxygen levels in the blood.  Only take over-the-counter or prescription medicines as told by your doctor.  Take antibiotic medicines as told by your doctor. Make sure you finish them even if you start to feel better.  Take supplements as told by your doctor.  Consider wearing a medical alert bracelet. This tells anyone caring for you in an emergency of your condition.  When traveling, keep your medical information, doctors' names, and the medicines you take with you at all times.  If you have a fever, do not take fever medicines right away. This could cover up a problem. Tell your doctor.   Keep all follow-up visits with your doctor. Sickle cell anemia requires regular medical care. GET HELP IF: You have a fever. GET HELP RIGHT AWAY IF:  You feel dizzy or faint.  You have new belly (abdominal) pain, especially on the left side near the stomach area.  You have a lasting, often  uncomfortable and painful erection of the penis (priapism). If it is not treated right away, you will become unable to have sex (impotence).  You have numbness in your arms or legs or you have a hard time moving them.  You have a hard time talking.  You have a fever or lasting symptoms for more than 2-3 days.  You have a fever and your symptoms suddenly get worse.  You have signs or symptoms of infection. These include:  Chills.  Being more tired than normal (lethargy).  Irritability.  Poor eating.  Throwing up (vomiting).  You have pain that is not helped with medicine.  You have shortness of breath.  You have pain in your chest.  You are coughing up pus-like or bloody mucus.  You have a stiff neck.  Your feet or hands swell or have pain.  Your belly looks bloated.  Your joints hurt. MAKE SURE YOU:  Understand these instructions.  Will watch your condition.  Will get help right away if you are not doing well or get worse.   This information is not intended to replace advice given to you by your health care provider. Make sure you discuss any questions you have with your health care provider.   Document Released: 11/16/2012 Document Revised: 06/12/2014 Document Reviewed: 11/16/2012 Elsevier Interactive Patient Education [employer]2016 Else[employer] Inc.

## 2014-11-30 NOTE — ED Notes (Signed)
PA at bedside.

## 2014-11-30 NOTE — ED Notes (Signed)
Pt a/o x4 and ambulatory.
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
## 2014-11-30 NOTE — ED Notes (Signed)
Per EMS #10 -Pt called EMS with c/o back and leg pain due to sickle cell. Pt is ambulatory, alert, oriented.BP 118/68 HR 82 Resp 16

## 2014-12-01 ENCOUNTER — Encounter (HOSPITAL_COMMUNITY): Payer: Self-pay | Admitting: Emergency Medicine

## 2014-12-01 ENCOUNTER — Emergency Department (HOSPITAL_COMMUNITY)
Admission: EM | Admit: 2014-12-01 | Discharge: 2014-12-01 | Disposition: A | Discharge status: Home / Self Care | Payer: Medicaid - Out of State | Attending: Emergency Medicine | Admitting: Emergency Medicine

## 2014-12-01 DIAGNOSIS — Z720 Tobacco use: Secondary | ICD-10-CM | POA: Diagnosis not present

## 2014-12-01 DIAGNOSIS — Z79899 Other long term (current) drug therapy: Secondary | ICD-10-CM | POA: Diagnosis not present

## 2014-12-01 DIAGNOSIS — E860 Dehydration: Secondary | ICD-10-CM | POA: Insufficient documentation

## 2014-12-01 DIAGNOSIS — D5700 Hb-SS disease with crisis, unspecified: Secondary | ICD-10-CM | POA: Diagnosis not present

## 2014-12-01 DIAGNOSIS — M549 Dorsalgia, unspecified: Secondary | ICD-10-CM | POA: Diagnosis present

## 2014-12-01 LAB — URINALYSIS, ROUTINE W REFLEX MICROSCOPIC
Bilirubin Urine: NEGATIVE
Glucose, UA: NEGATIVE mg/dL
Hgb urine dipstick: NEGATIVE
KETONES UR: NEGATIVE mg/dL
LEUKOCYTES UA: NEGATIVE
NITRITE: NEGATIVE
PROTEIN: NEGATIVE mg/dL
Specific Gravity, Urine: 1.013 (ref 1.005–1.030)
UROBILINOGEN UA: 1 mg/dL (ref 0.0–1.0)
pH: 7 (ref 5.0–8.0)

## 2014-12-01 LAB — CBC WITH DIFFERENTIAL/PLATELET
BASOS ABS: 0.1 10*3/uL (ref 0.0–0.1)
BASOS PCT: 0 %
EOS ABS: 0.6 10*3/uL (ref 0.0–0.7)
Eosinophils Relative: 4 %
HCT: 23.6 % — ABNORMAL LOW (ref 39.0–52.0)
HEMOGLOBIN: 8 g/dL — AB (ref 13.0–17.0)
LYMPHS ABS: 3.3 10*3/uL (ref 0.7–4.0)
Lymphocytes Relative: 21 %
MCH: 31.5 pg (ref 26.0–34.0)
MCHC: 33.9 g/dL (ref 30.0–36.0)
MCV: 92.9 fL (ref 78.0–100.0)
Monocytes Absolute: 1.4 10*3/uL — ABNORMAL HIGH (ref 0.1–1.0)
Monocytes Relative: 9 %
NEUTROS PCT: 66 %
Neutro Abs: 10.5 10*3/uL — ABNORMAL HIGH (ref 1.7–7.7)
PLATELETS: 339 10*3/uL (ref 150–400)
RBC: 2.54 MIL/uL — AB (ref 4.22–5.81)
RDW: 20.3 % — ABNORMAL HIGH (ref 11.5–15.5)
WBC: 15.8 10*3/uL — AB (ref 4.0–10.5)

## 2014-12-01 LAB — BASIC METABOLIC PANEL
ANION GAP: 5 (ref 5–15)
BUN: 7 mg/dL (ref 6–20)
CHLORIDE: 114 mmol/L — AB (ref 101–111)
CO2: 27 mmol/L (ref 22–32)
Calcium: 9 mg/dL (ref 8.9–10.3)
Creatinine, Ser: 0.48 mg/dL — ABNORMAL LOW (ref 0.61–1.24)
Glucose, Bld: 96 mg/dL (ref 65–99)
POTASSIUM: 3.8 mmol/L (ref 3.5–5.1)
SODIUM: 146 mmol/L — AB (ref 135–145)

## 2014-12-01 MED ORDER — DIPHENHYDRAMINE HCL 50 MG/ML IJ SOLN
12.5000 mg | Freq: Once | INTRAMUSCULAR | Status: AC
  Administered 2014-12-01: 12.5 mg via INTRAVENOUS
  Filled 2014-12-01: qty 1

## 2014-12-01 MED ORDER — HYDROMORPHONE HCL 1 MG/ML IJ SOLN
1.0000 mg | Freq: Once | INTRAMUSCULAR | Status: AC
  Administered 2014-12-01: 1 mg via INTRAVENOUS
  Filled 2014-12-01: qty 1

## 2014-12-01 MED ORDER — SODIUM CHLORIDE 0.9 % IV BOLUS (SEPSIS)
1000.0000 mL | Freq: Once | INTRAVENOUS | Status: AC
  Administered 2014-12-01: 1000 mL via INTRAVENOUS

## 2014-12-01 MED ORDER — ONDANSETRON HCL 4 MG/2ML IJ SOLN
4.0000 mg | Freq: Once | INTRAMUSCULAR | Status: AC
  Administered 2014-12-01: 4 mg via INTRAVENOUS
  Filled 2014-12-01: qty 2

## 2014-12-01 MED ORDER — DIPHENHYDRAMINE HCL 50 MG/ML IJ SOLN
INTRAMUSCULAR | Status: AC
  Administered 2014-12-01: 12.5 mg
  Filled 2014-12-01: qty 1

## 2014-12-01 MED ORDER — HYDROMORPHONE HCL 1 MG/ML IJ SOLN: 1.0000 mg | Freq: Once | INTRAMUSCULAR | Status: DC

## 2014-12-01 NOTE — ED Notes (Signed)
Pt reports recurrent back pain x 4 days, abdominal pain x 2 days, nausea x 1 day. Pt has been able to eat fast food every day this week. Pt has fried chicken for lunch today.

## 2014-12-01 NOTE — Discharge Instructions (Signed)
Follow-up your sickle cell doctor next week
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
## 2014-12-01 NOTE — ED Notes (Signed)
MD aware of response to medication.

## 2014-12-02 ENCOUNTER — Encounter (HOSPITAL_COMMUNITY): Payer: Self-pay | Admitting: Emergency Medicine

## 2014-12-02 ENCOUNTER — Emergency Department (HOSPITAL_COMMUNITY)
Admission: EM | Admit: 2014-12-02 | Discharge: 2014-12-02 | Disposition: A | Discharge status: Home / Self Care | Payer: Medicaid - Out of State | Attending: Emergency Medicine | Admitting: Emergency Medicine

## 2014-12-02 DIAGNOSIS — R109 Unspecified abdominal pain: Secondary | ICD-10-CM | POA: Insufficient documentation

## 2014-12-02 DIAGNOSIS — D571 Sickle-cell disease without crisis: Secondary | ICD-10-CM | POA: Insufficient documentation

## 2014-12-02 DIAGNOSIS — Z79899 Other long term (current) drug therapy: Secondary | ICD-10-CM | POA: Diagnosis not present

## 2014-12-02 DIAGNOSIS — Z7982 Long term (current) use of aspirin: Secondary | ICD-10-CM | POA: Insufficient documentation

## 2014-12-02 DIAGNOSIS — Z720 Tobacco use: Secondary | ICD-10-CM | POA: Diagnosis not present

## 2014-12-02 DIAGNOSIS — G8929 Other chronic pain: Secondary | ICD-10-CM | POA: Diagnosis not present

## 2014-12-02 DIAGNOSIS — M545 Low back pain: Secondary | ICD-10-CM | POA: Diagnosis present

## 2014-12-02 MED ORDER — HYDROMORPHONE HCL 2 MG PO TABS
4.0000 mg | ORAL_TABLET | Freq: Once | ORAL | Status: AC
  Administered 2014-12-02: 4 mg via ORAL
  Filled 2014-12-02: qty 2
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
## 2014-12-02 NOTE — Discharge Instructions (Signed)

## 2014-12-02 NOTE — ED Notes (Signed)
Pt. Stated, I m having a sickle cell crisis, I think, My back and stomach have been hurting for about 4-5 days, now.

## 2014-12-09 ENCOUNTER — Emergency Department (HOSPITAL_COMMUNITY)
Admission: EM | Admit: 2014-12-09 | Discharge: 2014-12-10 | Disposition: A | Discharge status: Home / Self Care | Payer: Medicaid - Out of State | Attending: Emergency Medicine | Admitting: Emergency Medicine

## 2014-12-09 ENCOUNTER — Encounter (HOSPITAL_COMMUNITY): Payer: Self-pay | Admitting: Family Medicine

## 2014-12-09 DIAGNOSIS — Z79891 Long term (current) use of opiate analgesic: Secondary | ICD-10-CM | POA: Diagnosis not present

## 2014-12-09 DIAGNOSIS — Z79899 Other long term (current) drug therapy: Secondary | ICD-10-CM | POA: Diagnosis not present

## 2014-12-09 DIAGNOSIS — D5700 Hb-SS disease with crisis, unspecified: Secondary | ICD-10-CM

## 2014-12-09 DIAGNOSIS — R112 Nausea with vomiting, unspecified: Secondary | ICD-10-CM | POA: Insufficient documentation

## 2014-12-09 DIAGNOSIS — D72829 Elevated white blood cell count, unspecified: Secondary | ICD-10-CM | POA: Diagnosis not present

## 2014-12-09 DIAGNOSIS — Z720 Tobacco use: Secondary | ICD-10-CM | POA: Diagnosis not present

## 2014-12-09 LAB — COMPREHENSIVE METABOLIC PANEL
ALT: 11 U/L — ABNORMAL LOW (ref 17–63)
ANION GAP: 5 (ref 5–15)
AST: 21 U/L (ref 15–41)
Albumin: 4.5 g/dL (ref 3.5–5.0)
Alkaline Phosphatase: 68 U/L (ref 38–126)
BUN: 7 mg/dL (ref 6–20)
CHLORIDE: 109 mmol/L (ref 101–111)
CO2: 26 mmol/L (ref 22–32)
Calcium: 9.3 mg/dL (ref 8.9–10.3)
Creatinine, Ser: 0.45 mg/dL — ABNORMAL LOW (ref 0.61–1.24)
GFR calc Af Amer: >60 mL/min (ref >60)
Glucose, Bld: 88 mg/dL (ref 65–99)
Potassium: 3.5 mmol/L (ref 3.5–5.1)
SODIUM: 140 mmol/L (ref 135–145)
Total Bilirubin: 5.5 mg/dL — ABNORMAL HIGH (ref 0.3–1.2)
Total Protein: 7.7 g/dL (ref 6.5–8.1)

## 2014-12-09 LAB — CBC WITH DIFFERENTIAL/PLATELET
BASOS PCT: 1 %
Basophils Absolute: 0.1 10*3/uL (ref 0.0–0.1)
EOS ABS: 0.6 10*3/uL (ref 0.0–0.7)
Eosinophils Relative: 5 %
HEMATOCRIT: 26.2 % — AB (ref 39.0–52.0)
HEMOGLOBIN: 9.2 g/dL — AB (ref 13.0–17.0)
LYMPHS ABS: 4.3 10*3/uL — AB (ref 0.7–4.0)
Lymphocytes Relative: 36 %
MCH: 32.4 pg (ref 26.0–34.0)
MCHC: 35.1 g/dL (ref 30.0–36.0)
MCV: 92.3 fL (ref 78.0–100.0)
MONOS PCT: 15 %
Monocytes Absolute: 1.8 10*3/uL — ABNORMAL HIGH (ref 0.1–1.0)
NEUTROS ABS: 5.2 10*3/uL (ref 1.7–7.7)
NEUTROS PCT: 43 %
Platelets: 264 10*3/uL (ref 150–400)
RBC: 2.84 MIL/uL — AB (ref 4.22–5.81)
RDW: 17.9 % — ABNORMAL HIGH (ref 11.5–15.5)
WBC: 12.1 10*3/uL — AB (ref 4.0–10.5)

## 2014-12-09 LAB — RETICULOCYTES
RBC.: 2.86 MIL/uL — AB (ref 4.22–5.81)
RETIC CT PCT: 8.9 % — AB (ref 0.4–3.1)
Retic Count, Absolute: 254.5 10*3/uL — ABNORMAL HIGH (ref 19.0–186.0)

## 2014-12-09 MED ORDER — SODIUM CHLORIDE 0.9 % IV BOLUS (SEPSIS)
1000.0000 mL | Freq: Once | INTRAVENOUS | Status: AC
  Administered 2014-12-09: 1000 mL via INTRAVENOUS

## 2014-12-09 MED ORDER — ONDANSETRON HCL 4 MG/2ML IJ SOLN
4.0000 mg | INTRAMUSCULAR | Status: AC
  Administered 2014-12-10: 4 mg via INTRAVENOUS
  Filled 2014-12-09: qty 2

## 2014-12-09 MED ORDER — DIPHENHYDRAMINE HCL 50 MG/ML IJ SOLN
25.0000 mg | Freq: Once | INTRAMUSCULAR | Status: AC
  Administered 2014-12-09: 25 mg via INTRAVENOUS
  Filled 2014-12-09: qty 1

## 2014-12-09 MED ORDER — HYDROMORPHONE HCL 2 MG/ML IJ SOLN
2.0000 mg | Freq: Once | INTRAMUSCULAR | Status: AC
  Administered 2014-12-10: 2 mg via INTRAVENOUS
  Filled 2014-12-09: qty 1

## 2014-12-09 MED ORDER — HYDROMORPHONE HCL 2 MG/ML IJ SOLN
2.0000 mg | Freq: Once | INTRAMUSCULAR | Status: AC
  Administered 2014-12-09: 2 mg via INTRAVENOUS
  Filled 2014-12-09: qty 1
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
## 2014-12-09 NOTE — ED Notes (Signed)
Pt is complaining of sickle cell pain in back and legs. Pain started on Friday morning. Pt has not taken any medications for pain.

## 2014-12-10 ENCOUNTER — Emergency Department (HOSPITAL_COMMUNITY)
Admission: EM | Admit: 2014-12-10 | Discharge: 2014-12-10 | Disposition: A | Discharge status: Home / Self Care | Payer: Medicaid - Out of State | Source: Home / Self Care | Attending: Emergency Medicine | Admitting: Emergency Medicine

## 2014-12-10 ENCOUNTER — Encounter (HOSPITAL_COMMUNITY): Payer: Self-pay | Admitting: Emergency Medicine

## 2014-12-10 DIAGNOSIS — D5700 Hb-SS disease with crisis, unspecified: Secondary | ICD-10-CM

## 2014-12-10 LAB — CBC WITH DIFFERENTIAL/PLATELET
Basophils Absolute: 0.1 10*3/uL (ref 0.0–0.1)
Basophils Relative: 1 %
EOS PCT: 5 %
Eosinophils Absolute: 0.7 10*3/uL (ref 0.0–0.7)
HCT: 25.5 % — ABNORMAL LOW (ref 39.0–52.0)
Hemoglobin: 8.7 g/dL — ABNORMAL LOW (ref 13.0–17.0)
LYMPHS ABS: 4.4 10*3/uL — AB (ref 0.7–4.0)
LYMPHS PCT: 33 %
MCH: 31.4 pg (ref 26.0–34.0)
MCHC: 34.1 g/dL (ref 30.0–36.0)
MCV: 92.1 fL (ref 78.0–100.0)
MONO ABS: 1.7 10*3/uL — AB (ref 0.1–1.0)
Monocytes Relative: 13 %
Neutro Abs: 6.3 10*3/uL (ref 1.7–7.7)
Neutrophils Relative %: 48 %
PLATELETS: 256 10*3/uL (ref 150–400)
RBC: 2.77 MIL/uL — ABNORMAL LOW (ref 4.22–5.81)
RDW: 17.9 % — AB (ref 11.5–15.5)
WBC: 13.1 10*3/uL — ABNORMAL HIGH (ref 4.0–10.5)

## 2014-12-10 LAB — BASIC METABOLIC PANEL
Anion gap: 4 — ABNORMAL LOW (ref 5–15)
BUN: 9 mg/dL (ref 6–20)
CALCIUM: 9.1 mg/dL (ref 8.9–10.3)
CO2: 29 mmol/L (ref 22–32)
Chloride: 108 mmol/L (ref 101–111)
Creatinine, Ser: 0.45 mg/dL — ABNORMAL LOW (ref 0.61–1.24)
GFR calc Af Amer: >60 mL/min (ref >60)
GLUCOSE: 101 mg/dL — AB (ref 65–99)
Potassium: 3.4 mmol/L — ABNORMAL LOW (ref 3.5–5.1)
Sodium: 141 mmol/L (ref 135–145)

## 2014-12-10 MED ORDER — HYDROMORPHONE HCL 2 MG/ML IJ SOLN
2.0000 mg | Freq: Once | INTRAMUSCULAR | Status: AC
  Administered 2014-12-10: 2 mg via INTRAVENOUS
  Filled 2014-12-10: qty 1

## 2014-12-10 MED ORDER — SODIUM CHLORIDE 0.9 % IV SOLN
1000.0000 mL | Freq: Once | INTRAVENOUS | Status: AC
  Administered 2014-12-10: 1000 mL via INTRAVENOUS

## 2014-12-10 MED ORDER — DIPHENHYDRAMINE HCL 50 MG/ML IJ SOLN
25.0000 mg | Freq: Once | INTRAMUSCULAR | Status: AC
  Administered 2014-12-10: 25 mg via INTRAVENOUS
  Filled 2014-12-10: qty 1

## 2014-12-10 MED ORDER — SODIUM CHLORIDE 0.9 % IV SOLN: 1000.0000 mL | INTRAVENOUS | Status: DC

## 2014-12-10 MED ORDER — ONDANSETRON HCL 4 MG/2ML IJ SOLN
4.0000 mg | Freq: Once | INTRAMUSCULAR | Status: DC | PRN
  Administered 2014-12-10: 4 mg via INTRAVENOUS
  Filled 2014-12-10: qty 2

## 2014-12-10 MED ORDER — SODIUM CHLORIDE 0.9 % IV SOLN: 1000.0000 mL | Freq: Once | INTRAVENOUS | Status: DC

## 2014-12-10 MED ORDER — HYDROMORPHONE HCL 2 MG/ML IJ SOLN: 2.0000 mg | Freq: Once | INTRAMUSCULAR | Status: DC

## 2014-12-10 MED ORDER — PROMETHAZINE HCL 25 MG/ML IJ SOLN
25.0000 mg | Freq: Once | INTRAMUSCULAR | Status: AC
  Administered 2014-12-10: 25 mg via INTRAVENOUS
  Filled 2014-12-10: qty 1

## 2014-12-10 MED ORDER — OXYCODONE-ACETAMINOPHEN 5-325 MG PO TABS: 2.0000 | ORAL_TABLET | ORAL | Status: DC | PRN

## 2014-12-10 NOTE — Discharge Instructions (Signed)
1. Medications: usual home medications 2. Treatment: rest, drink plenty of fluids 3. Follow Up: please followup with your primary doctor for discussion of your diagnoses and further evaluation after today's visit; if you do not have a primary care doctor use the resource guide provided to find one; please return to the ER for chest pain, shortness of breath, new or worsening symptoms   Sickle Cell Anemia, Adult Sickle cell anemia is a condition in which red blood cells have an abnormal "sickle" shape. This abnormal shape shortens the cells' life span, which results in a lower than normal concentration of red blood cells in the blood. The sickle shape also causes the cells to clump together and block free blood flow through the blood vessels. As a result, the tissues and organs of the body do not receive enough oxygen. Sickle cell anemia causes organ damage and pain and increases the risk of infection. CAUSES  Sickle cell anemia is a genetic disorder. Those who receive two copies of the gene have the condition, and those who receive one copy have the trait. RISK FACTORS The sickle cell gene is most common in people whose families originated in [place]. Other areas of the globe where sickle cell trait occurs include the Mediterranean, [place] and [place], the [place], and the [place].  SIGNS AND SYMPTOMS  Pain, especially in the extremities, back, chest, or abdomen (common). The pain may start suddenly or may develop following an illness, especially if there is dehydration. Pain can also occur due to overexertion or exposure to extreme temperature changes.  Frequent severe bacterial infections, especially certain types of pneumonia and meningitis.  Pain and swelling in the hands and feet.  Decreased activity.   Loss of appetite.   Change in behavior.  Headaches.  Seizures.  Shortness of breath or difficulty breathing.  Vision changes.  Skin ulcers. Those with the trait  may not have symptoms or they may have mild symptoms.  DIAGNOSIS  Sickle cell anemia is diagnosed with blood tests that demonstrate the genetic trait. It is often diagnosed during the newborn period, due to mandatory testing nationwide. A variety of blood tests, X-rays, CT scans, MRI scans, ultrasounds, and lung function tests may also be done to monitor the condition. TREATMENT  Sickle cell anemia may be treated with:  Medicines. You may be given pain medicines, antibiotic medicines (to treat and prevent infections) or medicines to increase the production of certain types of hemoglobin.  Fluids.  Oxygen.  Blood transfusions. HOME CARE INSTRUCTIONS   Drink enough fluid to keep your urine clear or pale yellow. Increase your fluid intake in hot weather and during exercise.  Do not smoke. Smoking lowers oxygen levels in the blood.   Only take over-the-counter or prescription medicines for pain, fever, or discomfort as directed by your health care provider.  Take antibiotics as directed by your health care provider. Make sure you finish them it even if you start to feel better.   Take supplements as directed by your health care provider.   Consider wearing a medical alert bracelet. This tells anyone caring for you in an emergency of your condition.   When traveling, keep your medical information, health care provider's names, and the medicines you take with you at all times.   If you develop a fever, do not take medicines to reduce the fever right away. This could cover up a problem that is developing. Notify your health care provider.  Keep all follow-up appointments with your  health care provider. Sickle cell anemia requires regular medical care. SEEK MEDICAL CARE IF: You have a fever. SEEK IMMEDIATE MEDICAL CARE IF:   You feel dizzy or faint.   You have new abdominal pain, especially on the left side near the stomach area.   You develop a persistent, often uncomfortable  and painful penile erection (priapism). If this is not treated immediately it will lead to impotence.   You have numbness your arms or legs or you have a hard time moving them.   You have a hard time with speech.   You have a fever or persistent symptoms for more than 2-3 days.   You have a fever and your symptoms suddenly get worse.   You have signs or symptoms of infection. These include:   Chills.   Abnormal tiredness (lethargy).   Irritability.   Poor eating.   Vomiting.   You develop pain that is not helped with medicine.   You develop shortness of breath.  You have pain in your chest.   You are coughing up pus-like or bloody sputum.   You develop a stiff neck.  Your feet or hands swell or have pain.  Your abdomen appears bloated.  You develop joint pain. MAKE SURE YOU:  Understand these instructions.   This information is not intended to replace advice given to you by your health care provider. Make sure you discuss any questions you have with your health care provider.   Document Released: 05/06/2005 Document Revised: 02/16/2014 Document Reviewed: 09/07/2012 Elsevier Interactive Patient Education 2016 [employer].   Emergency Department Resource Guide 1) Find a Doctor and Pay Out of Pocket Although you won't have to find out who is covered by your insurance plan, it is a good idea to ask around and get recommendations. You will then need to call the office and see if the doctor you have chosen will accept you as a new patient and what types of options they offer for patients who are self-pay. Some doctors offer discounts or will set up payment plans for their patients who do not have insurance, but you will need to ask so you aren't surprised when you get to your appointment.  2) Contact Your Local Health Department Not all health departments have doctors that can see patients for sick visits, but many do, so it is worth a call to see if  yours does. If you don't know where your local health department is, you can check in your phone book. The CDC also has a tool to help you locate your state's health department, and many state websites also have listings of all of their local health departments.  3) Find a Walk-in Clinic If your illness is not likely to be very severe or complicated, you may want to try a walk in clinic. These are popping up all over the country in pharmacies, drugstores, and shopping centers. They're usually staffed by nurse practitioners or physician assistants that have been trained to treat common illnesses and complaints. They're usually fairly quick and inexpensive. However, if you have serious medical issues or chronic medical problems, these are probably not your best option.  No Primary Care Doctor: - Call Health Connect at  [phone] - they can help you locate a primary care doctor that  accepts your insurance, provides certain services, etc. - Physician Referral Service- [phone]  Chronic Pain Problems: Organization         Address  Phone   Notes  [facility]  [phone] Patients need to be referred by their primary care doctor.   Medication Assistance: Organization         Address  Phone   Notes  [facility] [address] [place] [place], [place] [address] [phone] --Must be a resident of [facility] -- Must have NO insurance coverage whatsoever (no Medicaid/ Medicare, etc.) -- The pt. MUST have a primary care doctor that directs their care regularly and follows them in the community   Med[facility]ist  [phone]   [employer]  [phone]    Agencies that provide inexpensive medical care: Organization         Address  Phone   Notes  [facility]  [phone]   [facility]    [phone]   [facility] [address] [place], [place] [address] [phone]    [facility] [address] [place], [place] [phone]   Planned Parenthood    [phone]   [facility]    [phone]   [facility]  [address], [place] Phone:  [phone], Fax:  [phone] Hours of Operation:  9 am - 6 pm, M-F.  Also accepts Medicaid/Medicare and self-pay.  [facility]  [address], [place] Phone: [phone], Fax: [phone]. Hours of Operation:  8:30 am - 5:30 pm, M-F.  Also accepts Medicaid and self-pay.  [facility] [place] [address] [place] Phone: [phone]   [facility] [address] Natasha Bence [place], [place] [phone], Ext. [phone] Mondays & Thursdays: 7-9 AM.  First 15 patients are seen on a first come, first serve basis.    Medicaid-accepting [facility] Providers:  Organization         Address  Phone   Notes  [facility] [address], [place] [phone] Also accepts self-pay patients.  [facility] [address] Laurell Josephs [place], [place]  [phone]   [facility] [address], [place] [phone]   [facility] [address] [place] [phone]   Renaye Rakers [address], [place]   [phone] Only accepts [place] patients after they have their name applied to their card.   Self-Pay (no insurance) in [facility]:  Organization         Address  Phone   Notes  Sickle Cell Patients, [facility] [address] [place], [place] [phone]   [facility] [address] [place], [place] [phone]   [facility]  [address], [place] [phone]   [facility]/Dr. Osei-Bonsu  [address], [place] or [address], [place] [phone] Phone number for both [place] and [place] locations is the same.  [facility] [address], [place] [phone]   [facility] [address], [place] or [address] [phone]   [facility] [address] [place] [phone], phone; [phone], fax Sees patients 1st and 3rd Saturday of every month.  Must not qualify for public or private insurance (i.e. Medicaid, Medicare, [place]  Health Choice, Veterans' Benefits)  Household income should be no more than 200% of the poverty level The clinic cannot treat you if you are pregnant or think you are pregnant  Sexually transmitted diseases are not treated at the clinic.    Dental Care: Organization         Address  Phone  Notes  [facility] of [facility] [address] [place], [place] [phone] Accepts children up to age 34 who are enrolled in [place] or [place] Health Choice; pregnant women with a Medicaid card; and children who have applied for Medicaid or [place] Health Choice, but were declined, whose parents can pay a reduced fee at time of service.  [facility]  [address], [place] [phone] Accepts children up to age 57 who are enrolled in [place] or [place] Health Choice; pregnant women with a Medicaid card; and children who have applied for Medicaid or [place] Health Choice, but were declined, whose parents can pay a reduced fee at time of service.  [facility]  [address] [place], [place] [phone] Patients are seen by appointment only. Walk-ins are not accepted. [facility] will see patients 21 years of age and older. Monday - Tuesday (8am-5pm) Most Wednesdays (8:30-5pm) $30 per visit, cash only  [facility]  [address], [facility] [phone] Patients are seen by  appointment only. Walk-ins are not accepted. [facility] will see patients 43 years of age and older. One Wednesday Evening (Monthly: Volunteer Based).  $30 per visit, cash only  [employer]  [phone] for adults; Children under age 51, call Graduate Pediatric Dentistry at [phone]. Children aged 53-14, please call [phone] to request a pediatric application.  Dental services are provided in all areas of dental care including fillings, crowns and bridges, complete and partial dentures, implants, gum treatment, root canals, and extractions. Preventive care is also provided. Treatment is provided to both adults and children. Patients are selected via a lottery and there is often a waiting list.   [facility] [address] [place]  [phone] [email or web address]   [facility] [address] [place], [place] [phone], Ext. [phone] Second and Fourth Thursday of each month, opens at 6:30 AM; Clinic ends at 9 AM.  Patients are seen on a first-come first-served basis, and a limited number are seen during each clinic.   [facility]  [address] Ether Griffins [place], [place] [phone]   Eligibility Requirements You must have lived in [place], [place], or [place] counties for at least the last three months.   You cannot be eligible for state or federal sponsored [place], including [employer], [place], or [employer].   You generally cannot be eligible for healthcare insurance through your employer.    How to apply: Eligibility screenings are held every Tuesday and Wednesday afternoon from 1:00 pm until 4:00 pm. You do not need an appointment for the interview!  [facility] [address] [place], [place] [phone]   [facility] Health Department  [phone]   [facility] Health Department  [phone]   [facility]  [phone]     Behavioral Health Resources in the Community: Intensive Outpatient Programs Organization         Address  Phone  Notes  [facility] [phone]  [address], [place], [place] [phone]   [facility] [address], [place], [place] [phone]   [facility] [address], [place], [place]  [phone]   [facility] [address],  [place], [place] [phone] or [phone]   Substance Abuse Resources Organization         Address  Phone  Notes  [facility] Drug [facility]  [phone]   [facility]  [phone]   [facility]  [phone]   [facility]  [phone]   Residential & Outpatient Substance Abuse Program  [phone]   Psychological Services Organization         Address  Phone  Notes  [facility]  [phone]- [phone]   [facility]  [phone]- [phone]   [facility] [address], [place] [phone] or [phone]    Mobile Crisis Teams Organization         Address  Phone  Notes  [facility], Mobile Crisis Care Unit  [phone]   [facility]  [address] [place], [place] [phone]   Doristine LocksSharon DeEsch [address] [place] [phone]    Self-Help/Support Groups Organization         Address  Phone             Notes  [facility] [place] - variety of support groups  336- [phone] Call for more information  Narcotics Anonymous (NA), [facility] [address], [place]  2 meetings at this location   Statisticianesidential Treatment Programs Organization         Address  Phone  Notes  [facility] [address],    [place]  [phone]   [facility]  [address], [place] [phone], [place], [place] [phone]   [facility] [address], [place] [phone] Admissions: 8am-3pm M-F  Incentives  [facility]stance Abuse Treatment Center [address]    [place], [place] [phone]   [facility] [address], [place], [place] [phone]   [facility] [address]  [place], [place] [phone]   [facility] - Intensive Outpatient [address], [place], [place] [phone]   [facility] ([facility] [address]  [place], [place] [phone] or [phone]   [facility] ([facility]) [address] [place], [place] [phone] Accepts Medicaid  [facility] [address]  [place] [phone] Substance Abuse/Addiction Treatment   [facility] Resources Organization         Address  Phone  Notes  [facility]  [phone](888) [phone]   Angie FavaJulie Brannon, PhD [address] [place], [place]   [phone]) [phone] or [phone]   [facility]   [address] [place], [place] [phone](336) [phone]   [facility] [address], [place], [place] [phone]) [phone] Insurance/Medicaid/sponsorship through [facility]  [facility] and Families [address]                                    [place], [place] [phone]) [phone] Therapy/tele-psych/case  [facility] [address]   [place], [place] [phone]    Dr. Lolly MustacheArfeen  [phone](336) [phone]   [facility] [place] [place]  [employer] [facility] 1) [address],  [place] 2) [place] 3)  [place] [address], [place] [phone]  [phone]   [place] [phone] or [phone] (After Hours)

## 2014-12-10 NOTE — ED Notes (Signed)
Pt states he has sickle cell and is having leg and back pain  Pt states pain started 4 days ago and his home medications are not helping  Pt states he has nausea as well

## 2014-12-10 NOTE — Discharge Instructions (Signed)
You have been seen today for sickle cell crisis. Your lab tests showed no new abnormalities. Follow up with [facility]. Follow up with PCP as needed. Return to ED should symptoms worsen.

## 2014-12-10 NOTE — ED Notes (Signed)
Patient AMA.  I spoke to him around 11:30 and told him I would come and discharge him out.  He requested pain medication and decided he didn't want to wait.

## 2014-12-10 NOTE — ED Notes (Signed)
Pt repeatedly removing BP cuff and pulse ox as well as turning monitor off.  Explained to pt that I need to be able to monitor his VS consistently d/t receiving multiple doses of narcotics and sedating medication.  Pt verbalized understanding.
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
## 2014-12-11 ENCOUNTER — Emergency Department (EMERGENCY_DEPARTMENT_HOSPITAL)
Admission: EM | Admit: 2014-12-11 | Discharge: 2014-12-11 | Disposition: A | Discharge status: Home / Self Care | Payer: Medicaid - Out of State | Source: Home / Self Care | Attending: Emergency Medicine | Admitting: Emergency Medicine

## 2014-12-11 ENCOUNTER — Telehealth: Payer: Self-pay | Admitting: Clinical

## 2014-12-11 DIAGNOSIS — D5700 Hb-SS disease with crisis, unspecified: Secondary | ICD-10-CM

## 2014-12-11 MED ORDER — HYDROMORPHONE HCL 2 MG/ML IJ SOLN
2.0000 mg | Freq: Once | INTRAMUSCULAR | Status: AC
  Administered 2014-12-11: 2 mg via INTRAVENOUS
  Filled 2014-12-11: qty 1

## 2014-12-11 MED ORDER — KETOROLAC TROMETHAMINE 30 MG/ML IJ SOLN
30.0000 mg | INTRAMUSCULAR | Status: AC
  Administered 2014-12-11: 30 mg via INTRAVENOUS
  Filled 2014-12-11: qty 1

## 2014-12-11 MED ORDER — DIPHENHYDRAMINE HCL 25 MG PO CAPS
25.0000 mg | ORAL_CAPSULE | Freq: Once | ORAL | Status: AC
  Administered 2014-12-11: 25 mg via ORAL
  Filled 2014-12-11: qty 1

## 2014-12-11 MED ORDER — HYDROMORPHONE HCL 2 MG/ML IJ SOLN
0.0313 mg/kg | INTRAMUSCULAR | Status: AC
  Administered 2014-12-11: 1.9 mg via INTRAVENOUS
  Filled 2014-12-11: qty 1

## 2014-12-11 MED ORDER — MORPHINE SULFATE ER 30 MG PO TBCR
30.0000 mg | EXTENDED_RELEASE_TABLET | Freq: Two times a day (BID) | ORAL | Status: DC
  Administered 2014-12-11: 30 mg via ORAL
  Filled 2014-12-11: qty 2

## 2014-12-11 MED ORDER — OXYCODONE-ACETAMINOPHEN 10-325 MG PO TABS: 1.0000 | ORAL_TABLET | Freq: Four times a day (QID) | ORAL | Status: DC | PRN

## 2014-12-11 MED ORDER — HYDROMORPHONE HCL 2 MG/ML IJ SOLN: 0.0313 mg/kg | INTRAMUSCULAR | Status: AC

## 2014-12-11 NOTE — ED Notes (Signed)
Pt reports sickle cell pain crisis x4 days, pt left ED last night around 1130 for same. C/o back and leg pain. Pain 8/10.

## 2014-12-11 NOTE — Progress Notes (Signed)
Patient ID: Matthew Shannon, male   DOB: 10/31/1990, 24 y.o.   MRN: [id number]  Called by ED to admit patient Matthew Shannon. By the time I arrived in the ED patient had made a decision to be discharged home is possible. 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 He explained that he would be unable ot stay beyond 5:00am tomorrow as he has no-one elst to provide care for his children. This was discussed with Dr. Cyndie ChimeNguyen and she will continue treatment in the ED and then plan to discharge home.  Matthew Saladin A.

## 2014-12-11 NOTE — ED Notes (Signed)
MD at bedside.  edp present to speak with pt

## 2014-12-11 NOTE — ED Notes (Signed)
MD at bedside. 

## 2014-12-11 NOTE — Discharge Instructions (Signed)
Sickle Cell Anemia, Adult Sickle cell anemia is a condition in which red blood cells have an abnormal "sickle" shape. This abnormal shape shortens the cells' life span, which results in a lower than normal concentration of red blood cells in the blood. The sickle shape also causes the cells to clump together and block free blood flow through the blood vessels. As a result, the tissues and organs of the body do not receive enough oxygen. Sickle cell anemia causes organ damage and pain and increases the risk of infection. CAUSES  Sickle cell anemia is a genetic disorder. Those who receive two copies of the gene have the condition, and those who receive one copy have the trait. RISK FACTORS The sickle cell gene is most common in people whose families originated in [place]. Other areas of the globe where sickle cell trait occurs include the Mediterranean, [place] and [place], the [place], and the [place].  SIGNS AND SYMPTOMS  Pain, especially in the extremities, back, chest, or abdomen (common). The pain may start suddenly or may develop following an illness, especially if there is dehydration. Pain can also occur due to overexertion or exposure to extreme temperature changes.  Frequent severe bacterial infections, especially certain types of pneumonia and meningitis.  Pain and swelling in the hands and feet.  Decreased activity.   Loss of appetite.   Change in behavior.  Headaches.  Seizures.  Shortness of breath or difficulty breathing.  Vision changes.  Skin ulcers. Those with the trait may not have symptoms or they may have mild symptoms.  DIAGNOSIS  Sickle cell anemia is diagnosed with blood tests that demonstrate the genetic trait. It is often diagnosed during the newborn period, due to mandatory testing nationwide. A variety of blood tests, X-rays, CT scans, MRI scans, ultrasounds, and lung function tests may also be done to monitor the condition. TREATMENT  Sickle  cell anemia may be treated with:  Medicines. You may be given pain medicines, antibiotic medicines (to treat and prevent infections) or medicines to increase the production of certain types of hemoglobin.  Fluids.  Oxygen.  Blood transfusions. HOME CARE INSTRUCTIONS   Drink enough fluid to keep your urine clear or pale yellow. Increase your fluid intake in hot weather and during exercise.  Do not smoke. Smoking lowers oxygen levels in the blood.   Only take over-the-counter or prescription medicines for pain, fever, or discomfort as directed by your health care provider.  Take antibiotics as directed by your health care provider. Make sure you finish them it even if you start to feel better.   Take supplements as directed by your health care provider.   Consider wearing a medical alert bracelet. This tells anyone caring for you in an emergency of your condition.   When traveling, keep your medical information, health care provider's names, and the medicines you take with you at all times.   If you develop a fever, do not take medicines to reduce the fever right away. This could cover up a problem that is developing. Notify your health care provider.  Keep all follow-up appointments with your health care provider. Sickle cell anemia requires regular medical care. SEEK MEDICAL CARE IF: You have a fever. SEEK IMMEDIATE MEDICAL CARE IF:   You feel dizzy or faint.   You have new abdominal pain, especially on the left side near the stomach area.   You develop a persistent, often uncomfortable and painful penile erection (priapism). If this is not treated immediately it   will lead to impotence.   You have numbness your arms or legs or you have a hard time moving them.   You have a hard time with speech.   You have a fever or persistent symptoms for more than 2-3 days.   You have a fever and your symptoms suddenly get worse.   You have signs or symptoms of infection.  These include:   Chills.   Abnormal tiredness (lethargy).   Irritability.   Poor eating.   Vomiting.   You develop pain that is not helped with medicine.   You develop shortness of breath.  You have pain in your chest.   You are coughing up pus-like or bloody sputum.   You develop a stiff neck.  Your feet or hands swell or have pain.  Your abdomen appears bloated.  You develop joint pain. MAKE SURE YOU:  Understand these instructions.   This information is not intended to replace advice given to you by your health care provider. Make sure you discuss any questions you have with your health care provider.   Document Released: 05/06/2005 Document Revised: 02/16/2014 Document Reviewed: 09/07/2012 Elsevier Interactive Patient Education 2016 Elsevier Inc.  

## 2014-12-11 NOTE — Progress Notes (Signed)
Pt with 32 ED visits  Pt discussed in recent Sickle cell meeting with leadership- [facility] by Dr Deno EtienneM Matthews Pt refused admission Pt confirmed with ED CM he still  timhas not changed med[facility] [place], states he has no plans as of 12/11/14 [facility]. CM again encouraged pt to change medicaid  CM again provided pt with a list of [place] self pay resources  CM provided written information for uninsured accepting pcps, discussed the importance of pcp vs EDP services for f/u care, www.needymeds.org, www.goodrx.com, discounted pharmacies and other [place] resources such as [facility] , [facility], affordable care act, financial assistance, uninsured dental services, [place] med assist, DSS and  health department  Reviewed resources for [place] uninsured accepting pcps like [facility], family medicine at [address], [facility], [facility], local urgent care centers, [facility], [facility] family practice, general medical clinics, family services of the [place], [facility] urgent care plus others, medication resources, [facility] out patient pharmacies and housing Pt voiced understanding and appreciation of resources provided

## 2014-12-11 NOTE — ED Notes (Signed)
MD at bedside. EDP AND MD MATHEWS PRESENT TO SPEAK TO PT. PT REQUEST TO GO HOME
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
## 2014-12-11 NOTE — ED Notes (Signed)
Bed: [facility] Expected date:  Expected time:  Means of arrival:  Comments: 

## 2014-12-11 NOTE — Telephone Encounter (Signed)
[facility], that appointments could be made at either clinic by calling Ignatius Kloos at [phone].

## 2014-12-13 ENCOUNTER — Encounter (HOSPITAL_COMMUNITY): Payer: Self-pay | Admitting: Emergency Medicine

## 2014-12-13 ENCOUNTER — Emergency Department (HOSPITAL_COMMUNITY)
Admission: EM | Admit: 2014-12-13 | Discharge: 2014-12-13 | Disposition: A | Discharge status: Home / Self Care | Payer: Medicaid - Out of State | Attending: Emergency Medicine | Admitting: Emergency Medicine

## 2014-12-13 DIAGNOSIS — Z720 Tobacco use: Secondary | ICD-10-CM | POA: Diagnosis not present

## 2014-12-13 DIAGNOSIS — D571 Sickle-cell disease without crisis: Secondary | ICD-10-CM | POA: Insufficient documentation

## 2014-12-13 DIAGNOSIS — R011 Cardiac murmur, unspecified: Secondary | ICD-10-CM | POA: Diagnosis not present

## 2014-12-13 DIAGNOSIS — Z79899 Other long term (current) drug therapy: Secondary | ICD-10-CM | POA: Diagnosis not present

## 2014-12-13 DIAGNOSIS — D5700 Hb-SS disease with crisis, unspecified: Secondary | ICD-10-CM

## 2014-12-13 LAB — COMPREHENSIVE METABOLIC PANEL
ALBUMIN: 4.3 g/dL (ref 3.5–5.0)
ALT: 16 U/L — ABNORMAL LOW (ref 17–63)
ANION GAP: 5 (ref 5–15)
AST: 21 U/L (ref 15–41)
Alkaline Phosphatase: 66 U/L (ref 38–126)
BILIRUBIN TOTAL: 4.6 mg/dL — AB (ref 0.3–1.2)
BUN: 7 mg/dL (ref 6–20)
CO2: 29 mmol/L (ref 22–32)
Calcium: 9.4 mg/dL (ref 8.9–10.3)
Chloride: 105 mmol/L (ref 101–111)
Creatinine, Ser: 0.5 mg/dL — ABNORMAL LOW (ref 0.61–1.24)
GFR calc non Af Amer: >60 mL/min (ref >60)
GLUCOSE: 117 mg/dL — AB (ref 65–99)
POTASSIUM: 3.3 mmol/L — AB (ref 3.5–5.1)
Sodium: 139 mmol/L (ref 135–145)
TOTAL PROTEIN: 7.3 g/dL (ref 6.5–8.1)

## 2014-12-13 LAB — CBC WITH DIFFERENTIAL/PLATELET
BASOS PCT: 1 %
Basophils Absolute: 0.1 10*3/uL (ref 0.0–0.1)
EOS ABS: 0.6 10*3/uL (ref 0.0–0.7)
Eosinophils Relative: 4 %
HCT: 24.6 % — ABNORMAL LOW (ref 39.0–52.0)
HEMOGLOBIN: 8.7 g/dL — AB (ref 13.0–17.0)
Lymphocytes Relative: 23 %
Lymphs Abs: 3.7 10*3/uL (ref 0.7–4.0)
MCH: 32.5 pg (ref 26.0–34.0)
MCHC: 35.4 g/dL (ref 30.0–36.0)
MCV: 91.8 fL (ref 78.0–100.0)
MONOS PCT: 11 %
Monocytes Absolute: 1.7 10*3/uL — ABNORMAL HIGH (ref 0.1–1.0)
NEUTROS PCT: 61 %
Neutro Abs: 9.8 10*3/uL — ABNORMAL HIGH (ref 1.7–7.7)
Platelets: 250 10*3/uL (ref 150–400)
RBC: 2.68 MIL/uL — AB (ref 4.22–5.81)
RDW: 17.8 % — ABNORMAL HIGH (ref 11.5–15.5)
WBC: 15.8 10*3/uL — AB (ref 4.0–10.5)

## 2014-12-13 LAB — RETICULOCYTES
RBC.: 2.68 MIL/uL — AB (ref 4.22–5.81)
RETIC CT PCT: 8.9 % — AB (ref 0.4–3.1)
Retic Count, Absolute: 238.5 10*3/uL — ABNORMAL HIGH (ref 19.0–186.0)

## 2014-12-13 MED ORDER — ONDANSETRON HCL 4 MG/2ML IJ SOLN
4.0000 mg | INTRAMUSCULAR | Status: DC | PRN
  Administered 2014-12-13: 4 mg via INTRAVENOUS
  Filled 2014-12-13 (×2): qty 2

## 2014-12-13 MED ORDER — HYDROMORPHONE HCL 1 MG/ML IJ SOLN
1.0000 mg | Freq: Once | INTRAMUSCULAR | Status: AC
  Administered 2014-12-13: 1 mg via INTRAVENOUS
  Filled 2014-12-13: qty 1

## 2014-12-13 MED ORDER — HYDROMORPHONE HCL 2 MG/ML IJ SOLN: 0.0250 mg/kg | INTRAMUSCULAR | Status: AC

## 2014-12-13 MED ORDER — SODIUM CHLORIDE 0.45 % IV SOLN
INTRAVENOUS | Status: DC
  Administered 2014-12-13: 02:00:00 via INTRAVENOUS

## 2014-12-13 MED ORDER — DIPHENHYDRAMINE HCL 25 MG PO CAPS
25.0000 mg | ORAL_CAPSULE | Freq: Once | ORAL | Status: AC
  Administered 2014-12-13: 25 mg via ORAL
  Filled 2014-12-13: qty 1

## 2014-12-13 MED ORDER — HYDROMORPHONE HCL 2 MG/ML IJ SOLN
0.0250 mg/kg | INTRAMUSCULAR | Status: AC
  Administered 2014-12-13: 1.5 mg via INTRAVENOUS
  Filled 2014-12-13: qty 1

## 2014-12-13 NOTE — Discharge Instructions (Signed)
Follow up with your doctor for further management of your sickle cell pain  Sickle Cell Anemia, Adult Sickle cell anemia is a condition where your red blood cells are shaped like sickles. Red blood cells carry oxygen through the body. Sickle-shaped red blood cells do not live as long as normal red blood cells. They also clump together and block blood from flowing through the blood vessels. These things prevent the body from getting enough oxygen. Sickle cell anemia causes organ damage and pain. It also increases the risk of infection. HOME CARE  Drink enough fluid to keep your pee (urine) clear or pale yellow. Drink more in hot weather and during exercise.  Do not smoke. Smoking lowers oxygen levels in the blood.  Only take over-the-counter or prescription medicines as told by your doctor.  Take antibiotic medicines as told by your doctor. Make sure you finish them even if you start to feel better.  Take supplements as told by your doctor.  Consider wearing a medical alert bracelet. This tells anyone caring for you in an emergency of your condition.  When traveling, keep your medical information, doctors' names, and the medicines you take with you at all times.  If you have a fever, do not take fever medicines right away. This could cover up a problem. Tell your doctor.   Keep all follow-up visits with your doctor. Sickle cell anemia requires regular medical care. GET HELP IF: You have a fever. GET HELP RIGHT AWAY IF:  You feel dizzy or faint.  You have new belly (abdominal) pain, especially on the left side near the stomach area.  You have a lasting, often uncomfortable and painful erection of the penis (priapism). If it is not treated right away, you will become unable to have sex (impotence).  You have numbness in your arms or legs or you have a hard time moving them.  You have a hard time talking.  You have a fever or lasting symptoms for more than 2-3 days.  You have a  fever and your symptoms suddenly get worse.  You have signs or symptoms of infection. These include:  Chills.  Being more tired than normal (lethargy).  Irritability.  Poor eating.  Throwing up (vomiting).  You have pain that is not helped with medicine.  You have shortness of breath.  You have pain in your chest.  You are coughing up pus-like or bloody mucus.  You have a stiff neck.  Your feet or hands swell or have pain.  Your belly looks bloated.  Your joints hurt. MAKE SURE YOU:  Understand these instructions.  Will watch your condition.  Will get help right away if you are not doing well or get worse.   This information is not intended to replace advice given to you by your health care provider. Make sure you discuss any questions you have with your health care provider.   Document Released: 11/16/2012 Document Revised: 06/12/2014 Document Reviewed: 11/16/2012 Elsevier Interactive Patient Education [employer]2016 Elsevier Inc.

## 2014-12-13 NOTE — ED Notes (Signed)
PA at bedside.

## 2014-12-13 NOTE — ED Notes (Signed)
Pt c/o back and bilat lower extremity pain x 4 days, pt states pain similar to SS pain. Percocet at home with no relief.
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
## 2014-12-13 NOTE — ED Notes (Signed)
Patient resting quietly, eyes closed, chest observed for rise and fall. Patient was aroused when nurse entered room. patient was initially unable to rate pain and said he was feeling better. Patient was asked if he felt like he could go home and he said yes. As nurse exited the room he called the nurse back into the room requesting an additional dose of pain medication prior to d/c. PA updated. Orders received to administer one more dose of pain medication, watch for 30 minutes, and notify PA for discharge.

## 2014-12-14 ENCOUNTER — Emergency Department (HOSPITAL_COMMUNITY)
Admission: EM | Admit: 2014-12-14 | Discharge: 2014-12-14 | Disposition: A | Discharge status: Home / Self Care | Payer: Medicaid - Out of State | Attending: Emergency Medicine | Admitting: Emergency Medicine

## 2014-12-14 ENCOUNTER — Encounter (HOSPITAL_COMMUNITY): Payer: Self-pay | Admitting: Emergency Medicine

## 2014-12-14 DIAGNOSIS — Z79899 Other long term (current) drug therapy: Secondary | ICD-10-CM | POA: Insufficient documentation

## 2014-12-14 DIAGNOSIS — D5700 Hb-SS disease with crisis, unspecified: Secondary | ICD-10-CM | POA: Diagnosis present

## 2014-12-14 DIAGNOSIS — Z720 Tobacco use: Secondary | ICD-10-CM | POA: Diagnosis not present

## 2014-12-14 MED ORDER — KETOROLAC TROMETHAMINE 30 MG/ML IJ SOLN
60.0000 mg | Freq: Once | INTRAMUSCULAR | Status: AC
  Administered 2014-12-14: 60 mg via INTRAMUSCULAR
  Filled 2014-12-14: qty 2

## 2014-12-14 MED ORDER — HYDROMORPHONE HCL 1 MG/ML IJ SOLN
1.0000 mg | Freq: Once | INTRAMUSCULAR | Status: AC
  Administered 2014-12-14: 1 mg via INTRAMUSCULAR
  Filled 2014-12-14: qty 1

## 2014-12-14 MED ORDER — HYDROMORPHONE HCL 2 MG/ML IJ SOLN
2.0000 mg | Freq: Once | INTRAMUSCULAR | Status: AC
  Administered 2014-12-14: 2 mg via INTRAMUSCULAR
  Filled 2014-12-14: qty 1

## 2014-12-14 NOTE — Discharge Instructions (Signed)
Sickle Cell Anemia, Adult Sickle cell anemia is a condition in which red blood cells have an abnormal "sickle" shape. This abnormal shape shortens the cells' life span, which results in a lower than normal concentration of red blood cells in the blood. The sickle shape also causes the cells to clump together and block free blood flow through the blood vessels. As a result, the tissues and organs of the body do not receive enough oxygen. Sickle cell anemia causes organ damage and pain and increases the risk of infection. CAUSES  Sickle cell anemia is a genetic disorder. Those who receive two copies of the gene have the condition, and those who receive one copy have the trait. RISK FACTORS The sickle cell gene is most common in people whose families originated in [place]. Other areas of the globe where sickle cell trait occurs include the Mediterranean, [place] and [place], the [place], and the [place].  SIGNS AND SYMPTOMS  Pain, especially in the extremities, back, chest, or abdomen (common). The pain may start suddenly or may develop following an illness, especially if there is dehydration. Pain can also occur due to overexertion or exposure to extreme temperature changes.  Frequent severe bacterial infections, especially certain types of pneumonia and meningitis.  Pain and swelling in the hands and feet.  Decreased activity.   Loss of appetite.   Change in behavior.  Headaches.  Seizures.  Shortness of breath or difficulty breathing.  Vision changes.  Skin ulcers. Those with the trait may not have symptoms or they may have mild symptoms.  DIAGNOSIS  Sickle cell anemia is diagnosed with blood tests that demonstrate the genetic trait. It is often diagnosed during the newborn period, due to mandatory testing nationwide. A variety of blood tests, X-rays, CT scans, MRI scans, ultrasounds, and lung function tests may also be done to monitor the condition. TREATMENT  Sickle  cell anemia may be treated with:  Medicines. You may be given pain medicines, antibiotic medicines (to treat and prevent infections) or medicines to increase the production of certain types of hemoglobin.  Fluids.  Oxygen.  Blood transfusions. HOME CARE INSTRUCTIONS   Drink enough fluid to keep your urine clear or pale yellow. Increase your fluid intake in hot weather and during exercise.  Do not smoke. Smoking lowers oxygen levels in the blood.   Only take over-the-counter or prescription medicines for pain, fever, or discomfort as directed by your health care provider.  Take antibiotics as directed by your health care provider. Make sure you finish them it even if you start to feel better.   Take supplements as directed by your health care provider.   Consider wearing a medical alert bracelet. This tells anyone caring for you in an emergency of your condition.   When traveling, keep your medical information, health care provider's names, and the medicines you take with you at all times.   If you develop a fever, do not take medicines to reduce the fever right away. This could cover up a problem that is developing. Notify your health care provider.  Keep all follow-up appointments with your health care provider. Sickle cell anemia requires regular medical care. SEEK MEDICAL CARE IF: You have a fever. SEEK IMMEDIATE MEDICAL CARE IF:   You feel dizzy or faint.   You have new abdominal pain, especially on the left side near the stomach area.   You develop a persistent, often uncomfortable and painful penile erection (priapism). If this is not treated immediately it   will lead to impotence.   You have numbness your arms or legs or you have a hard time moving them.   You have a hard time with speech.   You have a fever or persistent symptoms for more than 2-3 days.   You have a fever and your symptoms suddenly get worse.   You have signs or symptoms of infection.  These include:   Chills.   Abnormal tiredness (lethargy).   Irritability.   Poor eating.   Vomiting.   You develop pain that is not helped with medicine.   You develop shortness of breath.  You have pain in your chest.   You are coughing up pus-like or bloody sputum.   You develop a stiff neck.  Your feet or hands swell or have pain.  Your abdomen appears bloated.  You develop joint pain. MAKE SURE YOU:  Understand these instructions.   This information is not intended to replace advice given to you by your health care provider. Make sure you discuss any questions you have with your health care provider.   Document Released: 05/06/2005 Document Revised: 02/16/2014 Document Reviewed: 09/07/2012 Elsevier Interactive Patient Education 2016 Elsevier Inc.  
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
## 2014-12-14 NOTE — ED Notes (Signed)
Patient c/o continued sickle cell pain. Patient was seen over night for the same complaint. Patient states his pain is in his BLE and back.

## 2014-12-15 ENCOUNTER — Emergency Department (HOSPITAL_COMMUNITY)
Admission: EM | Admit: 2014-12-15 | Discharge: 2014-12-16 | Disposition: A | Discharge status: Home / Self Care | Payer: Medicaid - Out of State | Attending: Emergency Medicine | Admitting: Emergency Medicine

## 2014-12-15 ENCOUNTER — Encounter (HOSPITAL_COMMUNITY): Payer: Self-pay | Admitting: Oncology

## 2014-12-15 DIAGNOSIS — Z79899 Other long term (current) drug therapy: Secondary | ICD-10-CM | POA: Insufficient documentation

## 2014-12-15 DIAGNOSIS — Z720 Tobacco use: Secondary | ICD-10-CM | POA: Insufficient documentation

## 2014-12-15 DIAGNOSIS — D5700 Hb-SS disease with crisis, unspecified: Secondary | ICD-10-CM | POA: Insufficient documentation

## 2014-12-15 MED ORDER — HYDROMORPHONE HCL 1 MG/ML IJ SOLN
1.0000 mg | Freq: Once | INTRAMUSCULAR | Status: AC
  Administered 2014-12-16: 1 mg via INTRAMUSCULAR
  Filled 2014-12-15: qty 1

## 2014-12-15 MED ORDER — ONDANSETRON HCL 4 MG/2ML IJ SOLN
4.0000 mg | Freq: Once | INTRAMUSCULAR | Status: AC
  Administered 2014-12-16: 4 mg via INTRAVENOUS
  Filled 2014-12-15: qty 2

## 2014-12-15 MED ORDER — SODIUM CHLORIDE 0.9 % IV BOLUS (SEPSIS)
1000.0000 mL | Freq: Once | INTRAVENOUS | Status: AC
  Administered 2014-12-16: 1000 mL via INTRAVENOUS
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
## 2014-12-15 NOTE — ED Notes (Signed)
Pt presents d/t typical sickle cell pain.

## 2014-12-16 ENCOUNTER — Emergency Department (HOSPITAL_COMMUNITY): Payer: Medicaid - Out of State

## 2014-12-16 ENCOUNTER — Encounter (HOSPITAL_COMMUNITY): Payer: Self-pay | Admitting: Emergency Medicine

## 2014-12-16 ENCOUNTER — Emergency Department (HOSPITAL_COMMUNITY)
Admission: EM | Admit: 2014-12-16 | Discharge: 2014-12-16 | Disposition: A | Discharge status: Home / Self Care | Payer: Medicaid - Out of State | Source: Home / Self Care | Attending: Emergency Medicine | Admitting: Emergency Medicine

## 2014-12-16 DIAGNOSIS — D5700 Hb-SS disease with crisis, unspecified: Secondary | ICD-10-CM

## 2014-12-16 LAB — COMPREHENSIVE METABOLIC PANEL
ALBUMIN: 4.2 g/dL (ref 3.5–5.0)
ALT: 20 U/L (ref 17–63)
AST: 27 U/L (ref 15–41)
Alkaline Phosphatase: 69 U/L (ref 38–126)
Anion gap: 5 (ref 5–15)
BUN: 6 mg/dL (ref 6–20)
CHLORIDE: 108 mmol/L (ref 101–111)
CO2: 27 mmol/L (ref 22–32)
Calcium: 9.3 mg/dL (ref 8.9–10.3)
Creatinine, Ser: 0.45 mg/dL — ABNORMAL LOW (ref 0.61–1.24)
GFR calc Af Amer: >60 mL/min (ref >60)
GFR calc non Af Amer: >60 mL/min (ref >60)
GLUCOSE: 82 mg/dL (ref 65–99)
POTASSIUM: 3.4 mmol/L — AB (ref 3.5–5.1)
SODIUM: 140 mmol/L (ref 135–145)
Total Bilirubin: 3.8 mg/dL — ABNORMAL HIGH (ref 0.3–1.2)
Total Protein: 7.4 g/dL (ref 6.5–8.1)

## 2014-12-16 LAB — CBC WITH DIFFERENTIAL/PLATELET
BASOS ABS: 0.1 10*3/uL (ref 0.0–0.1)
BASOS PCT: 1 %
EOS PCT: 4 %
Eosinophils Absolute: 0.7 10*3/uL (ref 0.0–0.7)
HCT: 22.7 % — ABNORMAL LOW (ref 39.0–52.0)
Hemoglobin: 8.1 g/dL — ABNORMAL LOW (ref 13.0–17.0)
Lymphocytes Relative: 31 %
Lymphs Abs: 5.1 10*3/uL — ABNORMAL HIGH (ref 0.7–4.0)
MCH: 32.1 pg (ref 26.0–34.0)
MCHC: 35.7 g/dL (ref 30.0–36.0)
MCV: 90.1 fL (ref 78.0–100.0)
MONO ABS: 1.9 10*3/uL — AB (ref 0.1–1.0)
Monocytes Relative: 12 %
Neutro Abs: 8.7 10*3/uL — ABNORMAL HIGH (ref 1.7–7.7)
Neutrophils Relative %: 52 %
PLATELETS: 229 10*3/uL (ref 150–400)
RBC: 2.52 MIL/uL — AB (ref 4.22–5.81)
RDW: 17.6 % — AB (ref 11.5–15.5)
WBC: 16.5 10*3/uL — AB (ref 4.0–10.5)

## 2014-12-16 LAB — RETICULOCYTES
RBC.: 2.52 MIL/uL — ABNORMAL LOW (ref 4.22–5.81)
RETIC CT PCT: 9 % — AB (ref 0.4–3.1)
Retic Count, Absolute: 226.8 10*3/uL — ABNORMAL HIGH (ref 19.0–186.0)

## 2014-12-16 MED ORDER — HYDROMORPHONE HCL 2 MG/ML IJ SOLN
2.0000 mg | INTRAMUSCULAR | Status: AC | PRN
  Administered 2014-12-16 (×3): 2 mg via INTRAVENOUS
  Filled 2014-12-16 (×3): qty 1

## 2014-12-16 MED ORDER — HYDROMORPHONE HCL 1 MG/ML IJ SOLN
1.0000 mg | Freq: Once | INTRAMUSCULAR | Status: AC
  Administered 2014-12-16: 1 mg via INTRAVENOUS
  Filled 2014-12-16: qty 1

## 2014-12-16 MED ORDER — ONDANSETRON HCL 4 MG/2ML IJ SOLN
4.0000 mg | Freq: Once | INTRAMUSCULAR | Status: AC
  Administered 2014-12-16: 4 mg via INTRAVENOUS
  Filled 2014-12-16: qty 2

## 2014-12-16 MED ORDER — DIPHENHYDRAMINE HCL 25 MG PO CAPS
25.0000 mg | ORAL_CAPSULE | Freq: Once | ORAL | Status: AC
  Administered 2014-12-16: 25 mg via ORAL
  Filled 2014-12-16: qty 1

## 2014-12-16 MED ORDER — DIPHENHYDRAMINE HCL 50 MG/ML IJ SOLN
12.5000 mg | Freq: Once | INTRAMUSCULAR | Status: AC
  Administered 2014-12-16: 12.5 mg via INTRAVENOUS
  Filled 2014-12-16: qty 1

## 2014-12-16 MED ORDER — KETOROLAC TROMETHAMINE 30 MG/ML IJ SOLN
30.0000 mg | Freq: Once | INTRAMUSCULAR | Status: AC
  Administered 2014-12-16: 30 mg via INTRAVENOUS
  Filled 2014-12-16: qty 1

## 2014-12-16 MED ORDER — SODIUM CHLORIDE 0.9 % IV BOLUS (SEPSIS)
1000.0000 mL | Freq: Once | INTRAVENOUS | Status: AC
Start: 2014-12-16 — End: 2014-12-16
  Administered 2014-12-16: 1000 mL via INTRAVENOUS

## 2014-12-16 NOTE — ED Notes (Signed)
Pt reported entire back pain without injury/trauma, LLQ pain with nausea but no vomiting/diarrhea, bil legs and bil arm pain [facility]

## 2014-12-16 NOTE — ED Notes (Signed)
RN entered Pts room and found IV removed an lying on bed and patient gone.  No signs of bleeding in room.  RN unable to go over DC papers with Pt.
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
## 2014-12-16 NOTE — Discharge Instructions (Signed)
Follow-up with her primary care doctor tomorrow for reevaluation. Return to ED for worsening symptoms.  Sickle Cell Anemia, Adult Sickle cell anemia is a condition in which red blood cells have an abnormal "sickle" shape. This abnormal shape shortens the cells' life span, which results in a lower than normal concentration of red blood cells in the blood. The sickle shape also causes the cells to clump together and block free blood flow through the blood vessels. As a result, the tissues and organs of the body do not receive enough oxygen. Sickle cell anemia causes organ damage and pain and increases the risk of infection. CAUSES  Sickle cell anemia is a genetic disorder. Those who receive two copies of the gene have the condition, and those who receive one copy have the trait. RISK FACTORS The sickle cell gene is most common in people whose families originated in [place]. Other areas of the globe where sickle cell trait occurs include the [place], [place] and [place], the [place], and the [place].  SIGNS AND SYMPTOMS  Pain, especially in the extremities, back, chest, or abdomen (common). The pain may start suddenly or may develop following an illness, especially if there is dehydration. Pain can also occur due to overexertion or exposure to extreme temperature changes.  Frequent severe bacterial infections, especially certain types of pneumonia and meningitis.  Pain and swelling in the hands and feet.  Decreased activity.   Loss of appetite.   Change in behavior.  Headaches.  Seizures.  Shortness of breath or difficulty breathing.  Vision changes.  Skin ulcers. Those with the trait may not have symptoms or they may have mild symptoms.  DIAGNOSIS  Sickle cell anemia is diagnosed with blood tests that demonstrate the genetic trait. It is often diagnosed during the newborn period, due to mandatory testing nationwide. A variety of blood tests, X-rays, CT scans, MRI  scans, ultrasounds, and lung function tests may also be done to monitor the condition. TREATMENT  Sickle cell anemia may be treated with:  Medicines. You may be given pain medicines, antibiotic medicines (to treat and prevent infections) or medicines to increase the production of certain types of hemoglobin.  Fluids.  Oxygen.  Blood transfusions. HOME CARE INSTRUCTIONS   Drink enough fluid to keep your urine clear or pale yellow. Increase your fluid intake in hot weather and during exercise.  Do not smoke. Smoking lowers oxygen levels in the blood.   Only take over-the-counter or prescription medicines for pain, fever, or discomfort as directed by your health care provider.  Take antibiotics as directed by your health care provider. Make sure you finish them it even if you start to feel better.   Take supplements as directed by your health care provider.   Consider wearing a medical alert bracelet. This tells anyone caring for you in an emergency of your condition.   When traveling, keep your medical information, health care provider's names, and the medicines you take with you at all times.   If you develop a fever, do not take medicines to reduce the fever right away. This could cover up a problem that is developing. Notify your health care provider.  Keep all follow-up appointments with your health care provider. Sickle cell anemia requires regular medical care. SEEK MEDICAL CARE IF: You have a fever. SEEK IMMEDIATE MEDICAL CARE IF:   You feel dizzy or faint.   You have new abdominal pain, especially on the left side near the stomach area.   You develop  a persistent, often uncomfortable and painful penile erection (priapism). If this is not treated immediately it will lead to impotence.   You have numbness your arms or legs or you have a hard time moving them.   You have a hard time with speech.   You have a fever or persistent symptoms for more than 2-3  days.   You have a fever and your symptoms suddenly get worse.   You have signs or symptoms of infection. These include:   Chills.   Abnormal tiredness (lethargy).   Irritability.   Poor eating.   Vomiting.   You develop pain that is not helped with medicine.   You develop shortness of breath.  You have pain in your chest.   You are coughing up pus-like or bloody sputum.   You develop a stiff neck.  Your feet or hands swell or have pain.  Your abdomen appears bloated.  You develop joint pain. MAKE SURE YOU:  Understand these instructions.   This information is not intended to replace advice given to you by your health care provider. Make sure you discuss any questions you have with your health care provider.   Document Released: 05/06/2005 Document Revised: 02/16/2014 Document Reviewed: 09/07/2012 Elsevier Interactive Patient Education [employer]2016 [employer] Inc.

## 2014-12-16 NOTE — Discharge Instructions (Signed)

## 2014-12-16 NOTE — ED Provider Notes (Signed)
Medical screening examination/treatment/procedure(s) were performed by non-physician practitioner and as supervising physician I was immediately available for consultation/collaboration.   EKG Interpretation None       Draxton Luu, MD 12/16/14 [id number]

## 2014-12-17 ENCOUNTER — Non-Acute Institutional Stay (HOSPITAL_COMMUNITY)
Admission: AD | Admit: 2014-12-17 | Discharge: 2014-12-17 | Disposition: A | Discharge status: Home / Self Care | Payer: Medicaid - Out of State | Source: Ambulatory Visit | Attending: Internal Medicine | Admitting: Internal Medicine

## 2014-12-17 ENCOUNTER — Ambulatory Visit (INDEPENDENT_AMBULATORY_CARE_PROVIDER_SITE_OTHER): Payer: Medicaid - Out of State | Admitting: Family Medicine

## 2014-12-17 ENCOUNTER — Emergency Department (HOSPITAL_COMMUNITY)
Admission: EM | Admit: 2014-12-17 | Discharge: 2014-12-17 | Disposition: A | Discharge status: Home / Self Care | Payer: Medicaid - Out of State | Source: Home / Self Care | Attending: Emergency Medicine | Admitting: Emergency Medicine

## 2014-12-17 ENCOUNTER — Encounter (HOSPITAL_COMMUNITY): Payer: Self-pay | Admitting: Emergency Medicine

## 2014-12-17 ENCOUNTER — Encounter: Payer: Self-pay | Admitting: Family Medicine

## 2014-12-17 VITALS — BP 115/69 | HR 79 | Temp 98.5°F | Resp 16 | Ht 70.0 in | Wt 130.0 lb

## 2014-12-17 DIAGNOSIS — Z720 Tobacco use: Secondary | ICD-10-CM | POA: Insufficient documentation

## 2014-12-17 DIAGNOSIS — D5700 Hb-SS disease with crisis, unspecified: Secondary | ICD-10-CM

## 2014-12-17 DIAGNOSIS — Z23 Encounter for immunization: Secondary | ICD-10-CM

## 2014-12-17 DIAGNOSIS — Z79899 Other long term (current) drug therapy: Secondary | ICD-10-CM

## 2014-12-17 DIAGNOSIS — G8929 Other chronic pain: Secondary | ICD-10-CM

## 2014-12-17 DIAGNOSIS — Z79891 Long term (current) use of opiate analgesic: Secondary | ICD-10-CM | POA: Diagnosis not present

## 2014-12-17 DIAGNOSIS — M79606 Pain in leg, unspecified: Secondary | ICD-10-CM | POA: Diagnosis present

## 2014-12-17 LAB — POCT URINALYSIS DIP (DEVICE)
BILIRUBIN URINE: NEGATIVE
Glucose, UA: NEGATIVE mg/dL
KETONES UR: NEGATIVE mg/dL
LEUKOCYTES UA: NEGATIVE
Nitrite: NEGATIVE
PH: 7 (ref 5.0–8.0)
Protein, ur: NEGATIVE mg/dL
SPECIFIC GRAVITY, URINE: 1.015 (ref 1.005–1.030)
Urobilinogen, UA: 0.2 mg/dL (ref 0.0–1.0)

## 2014-12-17 MED ORDER — HYDROMORPHONE HCL 2 MG/ML IJ SOLN: 2.5000 mg | Freq: Once | INTRAMUSCULAR | Status: DC

## 2014-12-17 MED ORDER — DEXTROSE-NACL 5-0.45 % IV SOLN
INTRAVENOUS | Status: DC
  Administered 2014-12-17: 14:00:00 via INTRAVENOUS

## 2014-12-17 MED ORDER — HYDROMORPHONE HCL 2 MG/ML IJ SOLN: 1.5000 mg | Freq: Once | INTRAMUSCULAR | Status: DC

## 2014-12-17 MED ORDER — HYDROMORPHONE HCL 2 MG/ML IJ SOLN
2.0000 mg | Freq: Once | INTRAMUSCULAR | Status: AC
  Administered 2014-12-17: 2 mg via INTRAVENOUS
  Filled 2014-12-17: qty 1

## 2014-12-17 MED ORDER — IBUPROFEN 600 MG PO TABS: 600.0000 mg | ORAL_TABLET | Freq: Three times a day (TID) | ORAL | Status: DC | PRN

## 2014-12-17 MED ORDER — MORPHINE SULFATE ER 30 MG PO TBCR: 30.0000 mg | EXTENDED_RELEASE_TABLET | Freq: Two times a day (BID) | ORAL | Status: DC

## 2014-12-17 MED ORDER — HYDROMORPHONE HCL 2 MG/ML IJ SOLN
1.5000 mg | Freq: Once | INTRAMUSCULAR | Status: AC
  Administered 2014-12-17: 1.5 mg via SUBCUTANEOUS
  Filled 2014-12-17: qty 1

## 2014-12-17 MED ORDER — HYDROMORPHONE HCL 2 MG/ML IJ SOLN
2.5000 mg | Freq: Once | INTRAMUSCULAR | Status: AC
  Administered 2014-12-17: 2.5 mg via INTRAVENOUS
  Filled 2014-12-17: qty 2

## 2014-12-17 MED ORDER — OXYCODONE HCL 5 MG PO TABS
5.0000 mg | ORAL_TABLET | ORAL | Status: DC | PRN
Start: 2014-12-17 — End: 2014-12-22

## 2014-12-17 MED ORDER — HYDROXYUREA 500 MG PO CAPS: 1000.0000 mg | ORAL_CAPSULE | Freq: Every day | ORAL | Status: DC

## 2014-12-17 MED ORDER — KETOROLAC TROMETHAMINE 30 MG/ML IJ SOLN
30.0000 mg | Freq: Once | INTRAMUSCULAR | Status: AC
  Administered 2014-12-17: 30 mg via INTRAVENOUS
  Filled 2014-12-17: qty 1

## 2014-12-17 MED ORDER — NICOTINE 14 MG/24HR TD PT24: 14.0000 mg | MEDICATED_PATCH | Freq: Every day | TRANSDERMAL | Status: DC

## 2014-12-17 NOTE — Progress Notes (Signed)
Subjective:    Patient ID: Matthew Shannon, male    DOB: [birth year], 24 y.o.   MRN: [id number]  HPI [name], a 24 year old male with a history of sickle cell anemia presents for an evaluation of sickle cell anemia, HbSS. Mr. Baby has had frequent emergency room visits and admissions over the past several months due to chronic pain. He states that he has been unable to make follow up appointments due to transportation and childcare constraints. He also reports that he has been unable to obtain medications consistently. He maintains that he is taking all other prescribed medications including hydroxyurea. Patient did not return for follow-up to increase hydroxyurea.  Pain is primarily in lower back and lower extremities. Current pain intensity is 7/10. He states that pain is worsened by activity. Patient last Percocet yesterday around 8 pm with minimal relief.   Past Medical History  Diagnosis Date  . Sickle cell anemia (HCC)    Immunization History  Administered Date(s) Administered  . Influenza,inj,Quad PF,36+ Mos 12/17/2014  . Pneumococcal Polysaccharide-23 12/17/2014  No Known Allergies  Social History   Social History  . Marital Status: Single    Spouse Name: N/A  . Number of Children: N/A  . Years of Education: N/A   Occupational History  . Not on file.   Social History Main Topics  . Smoking status: Current Every Day Smoker -- 0.25 packs/day for 0 years    Types: Cigarettes  . Smokeless tobacco: Never Used  . Alcohol Use: No  . Drug Use: No  . Sexual Activity: Not on file   Other Topics Concern  . Not on file   Social History Narrative   Review of Systems  Constitutional: Negative for fever and fatigue.  HENT: Negative.   Eyes: Negative.   Respiratory: Negative.   Cardiovascular: Negative.   Gastrointestinal: Negative.   Endocrine: Negative.   Genitourinary: Negative.   Musculoskeletal: Positive for myalgias and back pain.  Skin: Negative.    Allergic/Immunologic: Negative.   Neurological: Negative.   Hematological: Negative.   Psychiatric/Behavioral: Negative.  Negative for suicidal ideas and sleep disturbance.      Objective:   Physical Exam Transitioned to day infusion center for extended observation.       BP 115/69 mmHg  Pulse 79  Temp(Src) 98.5 F (36.9 C) (Oral)  Resp 16  Ht  (1.778 m)  Wt 130 lb (58.968 kg)  BMI 18.65 kg/m2  SpO2 100% Assessment & Plan:  1. Sickle cell pain crisis (HCC) Current pain intensity is 7/10, will transition to the day infusion center for extended observation. Will start IV fluids for cellular rehydration, toradol 30 mg IV, and IV dilaudid per weight based rapid re-dosing.    Will increase hydrea to 1000 mg daily.  Reviewed labs, ANC >2, hemoglobin 8.7, and platelet count is 250. Will re-check in 4 weeks.  We discussed the need for good hydration, monitoring of hydration status, avoidance of heat, cold, stress, and infection triggers. We discussed the risks and benefits of Hydrea, including bone marrow suppression, the possibility of GI upset, skin ulcers, hair thinning, and teratogenicity. The patient was reminded of the need to seek medical attention of any symptoms of bleeding, anemia, or infection. Continue folic acid 1 mg daily to prevent aplastic bone marrow crises.   Pulmonary evaluation - Patient denies severe recurrent wheezes, shortness of breath with exercise, or persistent cough. If these symptoms develop, pulmonary function tests with spirometry will be ordered,  and if abnormal, plan on referral to Pulmonology for further evaluation. Reminded [name] of the dangers of smoking and sickle cell anemia  Cardiac - Routine screening for pulmonary hypertension is not recommended.   Eye - High risk of proliferative retinopathy. Will send referral to opthalmology   Immunization status -He will receive Influenza and pneumonia vaccination today.    Acute and chronic  painful episodes - We will re-start opiate medications.   We discussed that pt is to receive hisSchedule II prescriptions only from us. Pt is also aware that the prescription history is available to us online through the [place]. Controlled substance agreement signed 12/17/2014. We reminded [name] that all patients receiving Schedule II narcotics must be seen for follow within one month of prescription being requested. We reviewed the terms of our pain agreement, including the need to keep medicines in a safe locked location away from children or pets, and the need to report excess sedation or constipation, measures to avoid constipation, and policies related to early refills and stolen prescriptions. According to the [place] [facility] program, we have reviewed details related to analgesia, adverse effects, aberrant behaviors.  - hydroxyurea (HYDREA) 500 MG capsule; Take 2 capsules (1,000 mg total) by mouth daily. May take with food to minimize GI side effects.  Dispense: 60 capsule; Refill: 0 - POCT urinalysis dipstick - ibuprofen (ADVIL,MOTRIN) 600 MG tablet; Take 1 tablet (600 mg total) by mouth every 8 (eight) hours as needed.  Dispense: 30 tablet; Refill: 0  2. Tobacco abuse Smoking cessation instruction/counseling given:  counseled patient on the dangers of tobacco use, advised patient to stop smoking, and reviewed strategies to maximize success - nicotine (NICODERM CQ) 14 mg/24hr patch; Place 1 patch (14 mg total) onto the skin daily.  Dispense: 28 patch; Refill: 1  3. Immunization due  - Pneumococcal polysaccharide vaccine 23-valent greater than or equal to 2yo subcutaneous/IM  4. Need for immunization against influenza - Flu Vaccine QUAD 36+ mos IM (Fluarix)  5. Chronic pain Patient is currently on 105 morphine milliequivalents per day. He has had frequent hospital emergency department visits due to chronic pain. Reviewed [place] Substance Reporting system prior to reorder, multiple  inconsistencies noted. Discussed medication management with [name] at length.  - Prescription Monitoring Profile (17)-[facility] - oxyCODONE (OXY IR/ROXICODONE) 5 MG immediate release tablet; Take 1 tablet (5 mg total) by mouth every 4 (four) hours as needed for severe pain.  Dispense: 60 tablet; Refill: 0 - morphine (MS CONTIN) 30 MG 12 hr tablet; Take 1 tablet (30 mg total) by mouth every 12 (twelve) hours.  Dispense: 60 tablet; Refill: 0   Jolena Kittle M, FNP

## 2014-12-17 NOTE — Discharge Instructions (Signed)
Sickle Cell Anemia, Adult Sickle cell anemia is a condition in which red blood cells have an abnormal "sickle" shape. This abnormal shape shortens the cells' life span, which results in a lower than normal concentration of red blood cells in the blood. The sickle shape also causes the cells to clump together and block free blood flow through the blood vessels. As a result, the tissues and organs of the body do not receive enough oxygen. Sickle cell anemia causes organ damage and pain and increases the risk of infection. CAUSES  Sickle cell anemia is a genetic disorder. Those who receive two copies of the gene have the condition, and those who receive one copy have the trait. RISK FACTORS The sickle cell gene is most common in people whose families originated in [place]. Other areas of the globe where sickle cell trait occurs include the Mediterranean, [place] and [place], the [place], and the [place].  SIGNS AND SYMPTOMS  Pain, especially in the extremities, back, chest, or abdomen (common). The pain may start suddenly or may develop following an illness, especially if there is dehydration. Pain can also occur due to overexertion or exposure to extreme temperature changes.  Frequent severe bacterial infections, especially certain types of pneumonia and meningitis.  Pain and swelling in the hands and feet.  Decreased activity.   Loss of appetite.   Change in behavior.  Headaches.  Seizures.  Shortness of breath or difficulty breathing.  Vision changes.  Skin ulcers. Those with the trait may not have symptoms or they may have mild symptoms.  DIAGNOSIS  Sickle cell anemia is diagnosed with blood tests that demonstrate the genetic trait. It is often diagnosed during the newborn period, due to mandatory testing nationwide. A variety of blood tests, X-rays, CT scans, MRI scans, ultrasounds, and lung function tests may also be done to monitor the condition. TREATMENT  Sickle  cell anemia may be treated with:  Medicines. You may be given pain medicines, antibiotic medicines (to treat and prevent infections) or medicines to increase the production of certain types of hemoglobin.  Fluids.  Oxygen.  Blood transfusions. HOME CARE INSTRUCTIONS   Drink enough fluid to keep your urine clear or pale yellow. Increase your fluid intake in hot weather and during exercise.  Do not smoke. Smoking lowers oxygen levels in the blood.   Only take over-the-counter or prescription medicines for pain, fever, or discomfort as directed by your health care provider.  Take antibiotics as directed by your health care provider. Make sure you finish them it even if you start to feel better.   Take supplements as directed by your health care provider.   Consider wearing a medical alert bracelet. This tells anyone caring for you in an emergency of your condition.   When traveling, keep your medical information, health care provider's names, and the medicines you take with you at all times.   If you develop a fever, do not take medicines to reduce the fever right away. This could cover up a problem that is developing. Notify your health care provider.  Keep all follow-up appointments with your health care provider. Sickle cell anemia requires regular medical care. SEEK MEDICAL CARE IF: You have a fever. SEEK IMMEDIATE MEDICAL CARE IF:   You feel dizzy or faint.   You have new abdominal pain, especially on the left side near the stomach area.   You develop a persistent, often uncomfortable and painful penile erection (priapism). If this is not treated immediately it   will lead to impotence.   You have numbness your arms or legs or you have a hard time moving them.   You have a hard time with speech.   You have a fever or persistent symptoms for more than 2-3 days.   You have a fever and your symptoms suddenly get worse.   You have signs or symptoms of infection.  These include:   Chills.   Abnormal tiredness (lethargy).   Irritability.   Poor eating.   Vomiting.   You develop pain that is not helped with medicine.   You develop shortness of breath.  You have pain in your chest.   You are coughing up pus-like or bloody sputum.   You develop a stiff neck.  Your feet or hands swell or have pain.  Your abdomen appears bloated.  You develop joint pain. MAKE SURE YOU:  Understand these instructions.   This information is not intended to replace advice given to you by your health care provider. Make sure you discuss any questions you have with your health care provider.   Document Released: 05/06/2005 Document Revised: 02/16/2014 Document Reviewed: 09/07/2012 Elsevier Interactive Patient Education 2016 Elsevier Inc.  

## 2014-12-17 NOTE — Discharge Instructions (Signed)
GO DIRECTLY TO THE SICKLE CELL CLINIC

## 2014-12-17 NOTE — ED Notes (Addendum)
Per EMS patient is having a sick cell crisis x3 days. Pain travels up the patient's legs and lower back Took prescribed percocet prior to EMS arrival. BP 110/60 Pulse 90  SpO2 98% Room air RR 16
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
## 2014-12-17 NOTE — Discharge Summary (Signed)
[facility] Discharge Summary   Patient ID: Matthew Shannon Coote MRN: [id number] DOB/AGE: 26/04/1990 26 y.o.  Admit date: 12/17/2014 Discharge date: 12/17/2014  Primary Care Physician:  Jeanann LewandowskyJEGEDE, OLUGBEMIGA, MD  Admission Diagnoses:  Active Problems:   Sickle cell pain crisis [facility](HCC)   Discharge Diagnoses:   Sickle cell anemia  Discharge Medications:    Medication List    ASK your doctor about these medications        folic acid 1 MG tablet  Commonly known as:  FOLVITE  Take 1 tablet (1 mg total) by mouth daily.     hydroxyurea 500 MG capsule  Commonly known as:  HYDREA  Take 2 capsules (1,000 mg total) by mouth daily. May take with food to minimize GI side effects.     ibuprofen 600 MG tablet  Commonly known as:  ADVIL,MOTRIN  Take 1 tablet (600 mg total) by mouth every 8 (eight) hours as needed.     morphine 30 MG 12 hr tablet  Commonly known as:  MS CONTIN  Take 1 tablet (30 mg total) by mouth every 12 (twelve) hours.     nicotine 14 mg/24hr patch  Commonly known as:  NICODERM CQ  Place 1 patch (14 mg total) onto the skin daily.     ondansetron 8 MG tablet  Commonly known as:  ZOFRAN  Take 1 tablet (8 mg total) by mouth every 8 (eight) hours as needed for nausea or vomiting.     oxyCODONE 5 MG immediate release tablet  Commonly known as:  Oxy IR/ROXICODONE  Take 1 tablet (5 mg total) by mouth every 4 (four) hours as needed for severe pain.         Consults:  None  Significant Diagnostic Studies:  Dg Chest 2 View  12/16/2014  CLINICAL DATA:  Upper back pain today.  No known injury.  Smoker. EXAM: CHEST  2 VIEW COMPARISON:  11/27/2014. FINDINGS: Interval borderline enlargement of the cardiac silhouette. Clear lungs. Mild diffuse peribronchial thickening. Stable mild to moderate scoliosis. Cholecystectomy clips. IMPRESSION: 1. No acute abnormality. 2. Mild chronic bronchitic changes with minimal progression. 3. Stable scoliosis. Electronically Signed    By: Beckie SaltsSteven  Reid M.D.   On: 12/16/2014 16:58   Dg Chest 2 View  11/27/2014  CLINICAL DATA:  Chest pain starting last night, smoker EXAM: CHEST  2 VIEW COMPARISON:  11/16/2014 FINDINGS: Cardiomediastinal silhouette is stable. There is dextroscoliosis lower thoracic spine. No acute infiltrate or pleural effusion. No pulmonary edema. IMPRESSION: No active cardiopulmonary disease. Dextroscoliosis lower thoracic spine. Electronically Signed   By: Natasha MeadLiviu  Pop M.D.   On: 11/27/2014 15:47     [facility] Course:  [name] Manganiello, a 24 year old male with a history of sickle cell anemia was admitted to the day infusion center for extended observation. Evaluated labs, consistent with baseline. Potassium level mildly decreased. Will re-check levels during follow-up appointment. [name] was given Toradol 30 mg IV times 1 for inflammation. Initiated Dilaudid IV per weight based rapid redosing. He received a total of 6 mg of dilaudid. Pain intensity decreased from 7/10 to 4/10. Patient is functional and can manage at home on pain medications as discussed. He was reminded to consistently take MS Contin 30 mg every 12 hours as scheduled and Oxycodone 10 mg every 4 hours as needed for moderate to severe pain. Patient's Hydroxyurea was also increased to 1000 mg during office visit. Patient expressed understanding of medication regimen. Reminded patient to return  to clinic on 12/18/2014 with [name] and Leticia Penna, MSW to discuss transportation concerns, patient expressed understanding. Patient also reminded of hours of operation [facility].  Physical Exam at Discharge:/ There were no vitals taken for this visit.  General Appearance:    Alert, cooperative, no distress, appears stated age  Head:    Normocephalic, without obvious abnormality, atraumatic  Eyes:    PERRL, conjunctiva/corneas clear, EOM's intact, fundi    benign, both eyes icteric  Ears:    Normal TM's and  external ear canals, both ears  Nose:   Nares normal, septum midline, mucosa normal, no drainage   or sinus tenderness  Back:     Symmetric, no curvature, ROM normal, no CVA tenderness  Chest wall:    No tenderness or deformity  Heart:    Regular rate and rhythm, S1 and S2 normal, no murmur, rub   or gallop  Abdomen:     Soft, non-tender, bowel sounds active all four quadrants,    no masses, no organomegaly  Extremities:   Extremities normal, atraumatic, no cyanosis or edema  Pulses:   2+ and symmetric all extremities  Skin:   Skin color, texture, turgor normal, no rashes or lesions  Lymph nodes:   Cervical, supraclavicular, and axillary nodes normal  Neurologic:   CNII-XII intact. Normal strength, sensation and reflexes      throughout   There were no vitals taken for this visit.   Disposition at Discharge: 01-Home or Self Care  Discharge Orders:   Condition at Discharge:   Stable  Time spent on Discharge:  15 minutes  Signed: Alayziah Tangeman M 12/17/2014, 2:53 PM

## 2014-12-17 NOTE — ED Notes (Signed)
Bed: [id number] Expected date:  Expected time:  Means of arrival:  Comments: [facility]

## 2014-12-17 NOTE — Progress Notes (Signed)
Pt received to the Day hospital for sickle cell pain control. Pt hydrated and SQ and IV given to patient with some relief. Discharge instructions given to patient. IV discontinued. Pt is to go to office of [facility] to pick up prescriptions. Pt voiced understanding.

## 2014-12-17 NOTE — ED Notes (Signed)
[employer] smart cell phone found in pt's room when cleaning.  Will put phone in bag with pt's name and place in lost in found cabinet in registration/nurse first area.

## 2014-12-18 ENCOUNTER — Ambulatory Visit: Payer: Medicaid - Out of State | Admitting: Internal Medicine

## 2014-12-19 ENCOUNTER — Emergency Department (EMERGENCY_DEPARTMENT_HOSPITAL)
Admission: EM | Admit: 2014-12-19 | Discharge: 2014-12-20 | Disposition: A | Discharge status: Home / Self Care | Payer: Medicaid - Out of State | Source: Home / Self Care | Attending: Emergency Medicine | Admitting: Emergency Medicine

## 2014-12-19 ENCOUNTER — Emergency Department (HOSPITAL_COMMUNITY): Payer: Medicaid - Out of State

## 2014-12-19 ENCOUNTER — Encounter (HOSPITAL_COMMUNITY): Payer: Self-pay | Admitting: Emergency Medicine

## 2014-12-19 ENCOUNTER — Encounter (HOSPITAL_COMMUNITY): Payer: Self-pay | Admitting: *Deleted

## 2014-12-19 ENCOUNTER — Emergency Department (HOSPITAL_COMMUNITY)
Admission: EM | Admit: 2014-12-19 | Discharge: 2014-12-19 | Disposition: A | Discharge status: Home / Self Care | Payer: Medicaid - Out of State | Source: Home / Self Care | Attending: Emergency Medicine | Admitting: Emergency Medicine

## 2014-12-19 DIAGNOSIS — D5700 Hb-SS disease with crisis, unspecified: Secondary | ICD-10-CM

## 2014-12-19 DIAGNOSIS — Z720 Tobacco use: Secondary | ICD-10-CM | POA: Insufficient documentation

## 2014-12-19 DIAGNOSIS — Z79899 Other long term (current) drug therapy: Secondary | ICD-10-CM

## 2014-12-19 MED ORDER — DIPHENHYDRAMINE HCL 50 MG/ML IJ SOLN
25.0000 mg | Freq: Once | INTRAMUSCULAR | Status: AC
  Administered 2014-12-20: 25 mg via INTRAVENOUS
  Filled 2014-12-19: qty 1

## 2014-12-19 MED ORDER — SODIUM CHLORIDE 0.9 % IV BOLUS (SEPSIS)
1000.0000 mL | Freq: Once | INTRAVENOUS | Status: AC
  Administered 2014-12-20: 1000 mL via INTRAVENOUS

## 2014-12-19 MED ORDER — HYDROMORPHONE HCL 2 MG/ML IJ SOLN
2.0000 mg | Freq: Once | INTRAMUSCULAR | Status: AC
  Administered 2014-12-19: 2 mg via INTRAVENOUS
  Filled 2014-12-19: qty 1

## 2014-12-19 MED ORDER — ONDANSETRON HCL 4 MG/2ML IJ SOLN
4.0000 mg | Freq: Once | INTRAMUSCULAR | Status: AC
  Administered 2014-12-19: 4 mg via INTRAVENOUS
  Filled 2014-12-19: qty 2

## 2014-12-19 MED ORDER — DEXTROSE 5 % IV BOLUS
1000.0000 mL | Freq: Once | INTRAVENOUS | Status: AC
  Administered 2014-12-19: 1000 mL via INTRAVENOUS

## 2014-12-19 MED ORDER — SODIUM CHLORIDE 0.9 % IV SOLN
INTRAVENOUS | Status: DC
  Administered 2014-12-20: 01:00:00 via INTRAVENOUS

## 2014-12-19 MED ORDER — HYDROMORPHONE HCL 1 MG/ML IJ SOLN
1.0000 mg | INTRAMUSCULAR | Status: AC
  Administered 2014-12-20 (×2): 1 mg via INTRAVENOUS
  Filled 2014-12-19 (×2): qty 1

## 2014-12-19 MED ORDER — ONDANSETRON HCL 4 MG/2ML IJ SOLN
4.0000 mg | Freq: Once | INTRAMUSCULAR | Status: AC
  Administered 2014-12-20: 4 mg via INTRAVENOUS
  Filled 2014-12-19: qty 2

## 2014-12-19 NOTE — ED Notes (Signed)
Pt reports sickle cell pain crisis x3 days, abdominal and back pain. Pain 7/10. Reports nausea, denies vomiting or diarrhea.

## 2014-12-19 NOTE — ED Notes (Signed)
Pt states he has sickle cell and is having back and chest pain  Pt states he was here earlier and not much was done so he left and went to the sickle cell clinic but continues to feel bad

## 2014-12-19 NOTE — Discharge Instructions (Signed)
Sickle Cell Anemia, Adult Sickle cell anemia is a condition in which red blood cells have an abnormal "sickle" shape. This abnormal shape shortens the cells' life span, which results in a lower than normal concentration of red blood cells in the blood. The sickle shape also causes the cells to clump together and block free blood flow through the blood vessels. As a result, the tissues and organs of the body do not receive enough oxygen. Sickle cell anemia causes organ damage and pain and increases the risk of infection. CAUSES  Sickle cell anemia is a genetic disorder. Those who receive two copies of the gene have the condition, and those who receive one copy have the trait. RISK FACTORS The sickle cell gene is most common in people whose families originated in [place]. Other areas of the globe where sickle cell trait occurs include the Mediterranean, [place] and [place], the [place], and the [place].  SIGNS AND SYMPTOMS  Pain, especially in the extremities, back, chest, or abdomen (common). The pain may start suddenly or may develop following an illness, especially if there is dehydration. Pain can also occur due to overexertion or exposure to extreme temperature changes.  Frequent severe bacterial infections, especially certain types of pneumonia and meningitis.  Pain and swelling in the hands and feet.  Decreased activity.   Loss of appetite.   Change in behavior.  Headaches.  Seizures.  Shortness of breath or difficulty breathing.  Vision changes.  Skin ulcers. Those with the trait may not have symptoms or they may have mild symptoms.  DIAGNOSIS  Sickle cell anemia is diagnosed with blood tests that demonstrate the genetic trait. It is often diagnosed during the newborn period, due to mandatory testing nationwide. A variety of blood tests, X-rays, CT scans, MRI scans, ultrasounds, and lung function tests may also be done to monitor the condition. TREATMENT  Sickle  cell anemia may be treated with:  Medicines. You may be given pain medicines, antibiotic medicines (to treat and prevent infections) or medicines to increase the production of certain types of hemoglobin.  Fluids.  Oxygen.  Blood transfusions. HOME CARE INSTRUCTIONS   Drink enough fluid to keep your urine clear or pale yellow. Increase your fluid intake in hot weather and during exercise.  Do not smoke. Smoking lowers oxygen levels in the blood.   Only take over-the-counter or prescription medicines for pain, fever, or discomfort as directed by your health care provider.  Take antibiotics as directed by your health care provider. Make sure you finish them it even if you start to feel better.   Take supplements as directed by your health care provider.   Consider wearing a medical alert bracelet. This tells anyone caring for you in an emergency of your condition.   When traveling, keep your medical information, health care provider's names, and the medicines you take with you at all times.   If you develop a fever, do not take medicines to reduce the fever right away. This could cover up a problem that is developing. Notify your health care provider.  Keep all follow-up appointments with your health care provider. Sickle cell anemia requires regular medical care. SEEK MEDICAL CARE IF: You have a fever. SEEK IMMEDIATE MEDICAL CARE IF:   You feel dizzy or faint.   You have new abdominal pain, especially on the left side near the stomach area.   You develop a persistent, often uncomfortable and painful penile erection (priapism). If this is not treated immediately it   will lead to impotence.   You have numbness your arms or legs or you have a hard time moving them.   You have a hard time with speech.   You have a fever or persistent symptoms for more than 2-3 days.   You have a fever and your symptoms suddenly get worse.   You have signs or symptoms of infection.  These include:   Chills.   Abnormal tiredness (lethargy).   Irritability.   Poor eating.   Vomiting.   You develop pain that is not helped with medicine.   You develop shortness of breath.  You have pain in your chest.   You are coughing up pus-like or bloody sputum.   You develop a stiff neck.  Your feet or hands swell or have pain.  Your abdomen appears bloated.  You develop joint pain. MAKE SURE YOU:  Understand these instructions.   This information is not intended to replace advice given to you by your health care provider. Make sure you discuss any questions you have with your health care provider.   Document Released: 05/06/2005 Document Revised: 02/16/2014 Document Reviewed: 09/07/2012 Elsevier Interactive Patient Education 2016 Elsevier Inc.  

## 2014-12-19 NOTE — ED Notes (Signed)
MD aware labs not obtained at this point. [facility] will be here shortly to pick patient up for clinic. MD cleared pt to go to clinic without labs.
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
## 2014-12-19 NOTE — ED Notes (Signed)
Pt reported to [facility] staff that he "does not need the [facility], he needs to go home."  This Charge RN confirmed that he is refusing transfer.  Pt sts "I need to get home to my kids.  I'm the only babysitter."  Primary RN to speak w/ EDP.

## 2014-12-19 NOTE — ED Notes (Signed)
Lab delay - pt wants to wait for IV.  RN aware.

## 2014-12-19 NOTE — ED Provider Notes (Signed)
By signing my name below, I, Arlan Organshley Leger, attest that this documentation has been prepared under the direction and in the presence of Kristen N Ward, DO.  Electronically Signed: Arlan OrganAshley Leger, ED Scribe. 12/19/2014. 11:29 PM.   TIME SEEN: 11:23 PM   CHIEF COMPLAINT:  Chief Complaint  Patient presents with  . Sickle Cell Pain Crisis     HPI:  HPI Comments: Matthew Shannon is a 24 y.o. male with a PMHx of sickle cell anemia who presents to the Emergency Department her for a sickle cell pain crisis this evening. He reports constant, ongoing chest pain and back pain x 1 day. States back pain is typical with recurrent crisis but states chest pain is somewhat new. Also reports aching in his legs.  Pain worse with movement.  No other aggravating or alleviating factors at this time. Matthew Shannon typically takes Percocet 10 mg but was recently given Percocet 5 mg. He denies any relief with this medication/dosage. No recent fever, chills, nausea, vomiting, or diarrhea. No cough or shortness of breath. No prior history of CAD, DVT, PE, CVA.  Pt was evaluated earlier this morning for same and encouraged to follow up with the sickle cell clinic at time of discharge. States he was sent to sickle cell clinic and reports they gave him oral medications and IV fluids. States his pain was not controlled.  PCP: Jeanann LewandowskyJEGEDE, OLUGBEMIGA, MD     ROS: See HPI Constitutional: no fever  Eyes: no drainage  ENT: no runny nose   Cardiovascular:  Positive chest pain  Resp: no SOB  GI: no vomiting GU: no dysuria Integumentary: no rash  Allergy: no hives  Musculoskeletal: no leg swelling. Positive back pain Neurological: no slurred speech ROS otherwise negative  PAST MEDICAL HISTORY/PAST SURGICAL HISTORY:  Past Medical History  Diagnosis Date  . Sickle cell anemia (HCC)     MEDICATIONS:  Prior to Admission medications   Medication Sig Start Date End Date Taking? Authorizing Provider  folic acid (FOLVITE) 1 MG  tablet Take 1 tablet (1 mg total) by mouth daily. 10/16/14   Quentin Angstlugbemiga E Jegede, MD  hydroxyurea (HYDREA) 500 MG capsule Take 2 capsules (1,000 mg total) by mouth daily. May take with food to minimize GI side effects. 12/17/14   Massie MaroonLachina M Hollis, FNP  ibuprofen (ADVIL,MOTRIN) 600 MG tablet Take 1 tablet (600 mg total) by mouth every 8 (eight) hours as needed. 12/17/14   Massie MaroonLachina M Hollis, FNP  morphine (MS CONTIN) 30 MG 12 hr tablet Take 1 tablet (30 mg total) by mouth every 12 (twelve) hours. 12/17/14   Massie MaroonLachina M Hollis, FNP  nicotine (NICODERM CQ) 14 mg/24hr patch Place 1 patch (14 mg total) onto the skin daily. 12/17/14   Massie MaroonLachina M Hollis, FNP  ondansetron (ZOFRAN) 8 MG tablet Take 1 tablet (8 mg total) by mouth every 8 (eight) hours as needed for nausea or vomiting. 10/24/14   Mercedes Camprubi-Soms, PA-C  oxyCODONE (OXY IR/ROXICODONE) 5 MG immediate release tablet Take 1 tablet (5 mg total) by mouth every 4 (four) hours as needed for severe pain. 12/17/14   Massie MaroonLachina M Hollis, FNP    ALLERGIES:  No Known Allergies  SOCIAL HISTORY:  Social History  Substance Use Topics  . Smoking status: Current Every Day Smoker -- 0.25 packs/day for 0 years    Types: Cigarettes  . Smokeless tobacco: Never Used  . Alcohol Use: No    FAMILY HISTORY: Family History  Problem Relation Age of Onset  . Sickle  cell anemia Brother   . Asthma Brother   . Diabetes Father     EXAM: BP 109/64 mmHg  Pulse 88  Temp(Src) 98.2 F (36.8 C) (Oral)  Resp 20  SpO2 95% CONSTITUTIONAL: Alert and oriented and responds appropriately to questions. Well-appearing; well-nourished, appears uncomfortable but is nontoxic, afebrile, well hydrated HEAD: Normocephalic EYES: Conjunctivae clear, PERRL ENT: normal nose; no rhinorrhea; moist mucous membranes; pharynx without lesions noted NECK: Supple, no meningismus, no LAD  CARD: RRR; S1 and S2 appreciated; Systolic murmurs noted, no clicks, no rubs, no gallops RESP: Normal chest  excursion without splinting or tachypnea; breath sounds clear and equal bilaterally; no wheezes, no rhonchi, no rales, no hypoxia or respiratory distress, speaking full sentences ABD/GI: Normal bowel sounds; non-distended; soft, non-tender, no rebound, no guarding, no peritoneal signs BACK:  The back appears normal. Diffusely tender without and step offs, deformity, or associated lesions. There is no CVA tenderness EXT: Normal ROM in all joints; non-tender to palpation; no edema; normal capillary refill; no cyanosis, no calf tenderness or swelling    SKIN: Normal color for age and race; warm NEURO: Moves all extremities equally, sensation to light touch intact diffusely, cranial nerves II through XII intact PSYCH: The patient's mood and manner are appropriate. Grooming and personal hygiene are appropriate.  MEDICAL DECISION MAKING: Patient here with complaints of pain from sickle cell crisis for the last several days. Hemodynamic is stable. Neurologically intact. We'll give IV fluids, pain and nausea medicine. We'll obtain labs, chest x-ray, EKG.  ED PROGRESS: 2:30 AM  Patient's labs appear to be at their baseline. Troponin negative. Chest x-ray clear. EKG shows no changes. Patient initially reported that he felt after 2 mg of IV Dilaudid that he was feeling better and he thought another 2 mg of IV Dilaudid was control his pain. Now that every assessed time I had to shake him awake and he states he does not feel his pain is controlled. Discussed with him that I will discuss this with the hospitalist but I feel strongly that he can be discharged as he appears very comfortable. Patient is here frequently for pain control.   2:50 AM  Pt seen by hospitalist for admission. Dr. Maryfrances Bunnell had seen the patient and was discussing admission with the patient and now patient is refusing admission and states he would like to go home. States he will come back if his pain is not controlled at home. We'll discharge  patient from the emergency department.    EKG Interpretation  Date/Time:  Thursday December 20 2014 00:02:50 EST Ventricular Rate:  86 PR Interval:  208 QRS Duration: 105 QT Interval:  360 QTC Calculation: 430 R Axis:   85 Text Interpretation:  Sinus rhythm Borderline prolonged PR interval Probable left ventricular hypertrophy Nonspecific T abnormalities, lateral leads No significant change since last tracing Confirmed by WARD,  DO, KRISTEN [phone]) on 12/20/2014 12:24:57 AM        I personally performed the services described in this documentation, which was scribed in my presence. The recorded information has been reviewed and is accurate.   Layla Maw Ward, DO 12/20/14 [phone]

## 2014-12-20 ENCOUNTER — Encounter (HOSPITAL_COMMUNITY): Payer: Self-pay | Admitting: Oncology

## 2014-12-20 ENCOUNTER — Inpatient Hospital Stay (HOSPITAL_COMMUNITY)
Admission: EM | Admit: 2014-12-20 | Discharge: 2014-12-22 | DRG: 812 | Disposition: A | Discharge status: Home / Self Care | Payer: Medicaid - Out of State | Attending: Internal Medicine | Admitting: Internal Medicine

## 2014-12-20 ENCOUNTER — Encounter (HOSPITAL_COMMUNITY): Payer: Self-pay | Admitting: Family Medicine

## 2014-12-20 DIAGNOSIS — F17210 Nicotine dependence, cigarettes, uncomplicated: Secondary | ICD-10-CM | POA: Diagnosis present

## 2014-12-20 DIAGNOSIS — E876 Hypokalemia: Secondary | ICD-10-CM | POA: Diagnosis present

## 2014-12-20 DIAGNOSIS — D72829 Elevated white blood cell count, unspecified: Secondary | ICD-10-CM | POA: Diagnosis present

## 2014-12-20 DIAGNOSIS — D5700 Hb-SS disease with crisis, unspecified: Secondary | ICD-10-CM | POA: Diagnosis not present

## 2014-12-20 DIAGNOSIS — G8929 Other chronic pain: Secondary | ICD-10-CM | POA: Diagnosis present

## 2014-12-20 DIAGNOSIS — R17 Unspecified jaundice: Secondary | ICD-10-CM | POA: Diagnosis present

## 2014-12-20 LAB — COMPREHENSIVE METABOLIC PANEL
ALT: 14 U/L — ABNORMAL LOW (ref 17–63)
ANION GAP: 4 — AB (ref 5–15)
AST: 21 U/L (ref 15–41)
Albumin: 4.3 g/dL (ref 3.5–5.0)
Alkaline Phosphatase: 74 U/L (ref 38–126)
BUN: 5 mg/dL — ABNORMAL LOW (ref 6–20)
CHLORIDE: 106 mmol/L (ref 101–111)
CO2: 29 mmol/L (ref 22–32)
Calcium: 9.1 mg/dL (ref 8.9–10.3)
Creatinine, Ser: 0.43 mg/dL — ABNORMAL LOW (ref 0.61–1.24)
Glucose, Bld: 83 mg/dL (ref 65–99)
POTASSIUM: 3.4 mmol/L — AB (ref 3.5–5.1)
SODIUM: 139 mmol/L (ref 135–145)
Total Bilirubin: 4.1 mg/dL — ABNORMAL HIGH (ref 0.3–1.2)
Total Protein: 7 g/dL (ref 6.5–8.1)

## 2014-12-20 LAB — CBC WITH DIFFERENTIAL/PLATELET
BASOS ABS: 0.2 10*3/uL — AB (ref 0.0–0.1)
BASOS PCT: 1 %
EOS ABS: 0.8 10*3/uL — AB (ref 0.0–0.7)
Eosinophils Relative: 5 %
HCT: 21.9 % — ABNORMAL LOW (ref 39.0–52.0)
Hemoglobin: 8.1 g/dL — ABNORMAL LOW (ref 13.0–17.0)
Lymphocytes Relative: 25 %
Lymphs Abs: 4.2 10*3/uL — ABNORMAL HIGH (ref 0.7–4.0)
MCH: 33.9 pg (ref 26.0–34.0)
MCHC: 37 g/dL — ABNORMAL HIGH (ref 30.0–36.0)
MCV: 91.6 fL (ref 78.0–100.0)
Monocytes Absolute: 1.5 10*3/uL — ABNORMAL HIGH (ref 0.1–1.0)
Monocytes Relative: 9 %
NEUTROS ABS: 10 10*3/uL — AB (ref 1.7–7.7)
NEUTROS PCT: 60 %
PLATELETS: 248 10*3/uL (ref 150–400)
RBC: 2.39 MIL/uL — ABNORMAL LOW (ref 4.22–5.81)
RDW: 19.1 % — AB (ref 11.5–15.5)
WBC: 16.7 10*3/uL — ABNORMAL HIGH (ref 4.0–10.5)

## 2014-12-20 LAB — I-STAT TROPONIN, ED: TROPONIN I, POC: 0 ng/mL (ref 0.00–0.08)

## 2014-12-20 LAB — CREATININE, SERUM: Creatinine, Ser: 0.42 mg/dL — ABNORMAL LOW (ref 0.61–1.24)

## 2014-12-20 LAB — CBC
HEMATOCRIT: 19.8 % — AB (ref 39.0–52.0)
HEMOGLOBIN: 7.1 g/dL — AB (ref 13.0–17.0)
MCH: 33 pg (ref 26.0–34.0)
MCHC: 35.9 g/dL (ref 30.0–36.0)
MCV: 92.1 fL (ref 78.0–100.0)
Platelets: 225 10*3/uL (ref 150–400)
RBC: 2.15 MIL/uL — ABNORMAL LOW (ref 4.22–5.81)
RDW: 19.3 % — ABNORMAL HIGH (ref 11.5–15.5)
WBC: 15.7 10*3/uL — AB (ref 4.0–10.5)

## 2014-12-20 LAB — RETICULOCYTES
RBC.: 2.39 MIL/uL — AB (ref 4.22–5.81)
RETIC COUNT ABSOLUTE: 351.3 10*3/uL — AB (ref 19.0–186.0)
RETIC CT PCT: 14.7 % — AB (ref 0.4–3.1)

## 2014-12-20 MED ORDER — HYDROMORPHONE HCL 2 MG/ML IJ SOLN
2.0000 mg | Freq: Once | INTRAMUSCULAR | Status: AC
  Administered 2014-12-20: 2 mg via INTRAVENOUS
  Filled 2014-12-20: qty 1

## 2014-12-20 MED ORDER — HYDROXYUREA 500 MG PO CAPS
1000.0000 mg | ORAL_CAPSULE | Freq: Every day | ORAL | Status: DC
  Administered 2014-12-20 – 2014-12-22 (×3): 1000 mg via ORAL
  Filled 2014-12-20 (×3): qty 2

## 2014-12-20 MED ORDER — DEXTROSE-NACL 5-0.45 % IV SOLN
INTRAVENOUS | Status: DC
  Administered 2014-12-20: 16:00:00 via INTRAVENOUS
  Administered 2014-12-21: 75 mL/h via INTRAVENOUS
  Administered 2014-12-21: 18:00:00 via INTRAVENOUS

## 2014-12-20 MED ORDER — POLYETHYLENE GLYCOL 3350 17 G PO PACK: 17.0000 g | PACK | Freq: Every day | ORAL | Status: DC | PRN

## 2014-12-20 MED ORDER — FLEET ENEMA 7-19 GM/118ML RE ENEM: 1.0000 | ENEMA | Freq: Once | RECTAL | Status: DC | PRN

## 2014-12-20 MED ORDER — SENNA 8.6 MG PO TABS: 1.0000 | ORAL_TABLET | Freq: Two times a day (BID) | ORAL | Status: DC

## 2014-12-20 MED ORDER — SODIUM CHLORIDE 0.9 % IV SOLN
25.0000 mg | INTRAVENOUS | Status: DC | PRN
  Administered 2014-12-21: 25 mg via INTRAVENOUS
  Filled 2014-12-20 (×3): qty 0.5

## 2014-12-20 MED ORDER — ENOXAPARIN SODIUM 40 MG/0.4ML ~~LOC~~ SOLN
40.0000 mg | SUBCUTANEOUS | Status: DC
  Filled 2014-12-20 (×3): qty 0.4

## 2014-12-20 MED ORDER — MORPHINE SULFATE ER 30 MG PO TBCR
30.0000 mg | EXTENDED_RELEASE_TABLET | Freq: Two times a day (BID) | ORAL | Status: DC
  Administered 2014-12-20 – 2014-12-22 (×5): 30 mg via ORAL
  Filled 2014-12-20 (×5): qty 1

## 2014-12-20 MED ORDER — DIPHENHYDRAMINE HCL 12.5 MG/5ML PO ELIX: 12.5000 mg | ORAL_SOLUTION | Freq: Four times a day (QID) | ORAL | Status: DC | PRN

## 2014-12-20 MED ORDER — POTASSIUM CHLORIDE CRYS ER 20 MEQ PO TBCR
40.0000 meq | EXTENDED_RELEASE_TABLET | Freq: Every day | ORAL | Status: DC
  Administered 2014-12-20 – 2014-12-22 (×2): 40 meq via ORAL
  Filled 2014-12-20 (×4): qty 2

## 2014-12-20 MED ORDER — SENNOSIDES-DOCUSATE SODIUM 8.6-50 MG PO TABS
1.0000 | ORAL_TABLET | Freq: Two times a day (BID) | ORAL | Status: DC
  Administered 2014-12-20 – 2014-12-22 (×5): 1 via ORAL
  Filled 2014-12-20 (×7): qty 1

## 2014-12-20 MED ORDER — FOLIC ACID 1 MG PO TABS
1.0000 mg | ORAL_TABLET | Freq: Every day | ORAL | Status: DC
  Administered 2014-12-20 – 2014-12-22 (×3): 1 mg via ORAL
  Filled 2014-12-20 (×3): qty 1

## 2014-12-20 MED ORDER — SODIUM CHLORIDE 0.9 % IV SOLN
INTRAVENOUS | Status: AC
  Administered 2014-12-20 (×2): via INTRAVENOUS

## 2014-12-20 MED ORDER — ONDANSETRON HCL 4 MG/2ML IJ SOLN: 4.0000 mg | Freq: Four times a day (QID) | INTRAMUSCULAR | Status: DC | PRN

## 2014-12-20 MED ORDER — SODIUM CHLORIDE 0.9 % IJ SOLN: 9.0000 mL | INTRAMUSCULAR | Status: DC | PRN

## 2014-12-20 MED ORDER — POTASSIUM CHLORIDE CRYS ER 20 MEQ PO TBCR
40.0000 meq | EXTENDED_RELEASE_TABLET | Freq: Once | ORAL | Status: AC
  Administered 2014-12-20: 40 meq via ORAL
  Filled 2014-12-20: qty 2

## 2014-12-20 MED ORDER — HYDROMORPHONE 1 MG/ML IV SOLN
INTRAVENOUS | Status: DC
  Administered 2014-12-20: 22:00:00 via INTRAVENOUS
  Administered 2014-12-20: 3.8 mg via INTRAVENOUS
  Administered 2014-12-20: 6 mg via INTRAVENOUS
  Administered 2014-12-20: 06:00:00 via INTRAVENOUS
  Administered 2014-12-20 (×2): 7.8 mg via INTRAVENOUS
  Administered 2014-12-20: 1.2 mg via INTRAVENOUS
  Administered 2014-12-21: 10.8 mg via INTRAVENOUS
  Administered 2014-12-21: 16:00:00 via INTRAVENOUS
  Administered 2014-12-21: 7.5 mg via INTRAVENOUS
  Administered 2014-12-22: 12:00:00 via INTRAVENOUS
  Administered 2014-12-22: 4.8 mg via INTRAVENOUS
  Administered 2014-12-22: 7.8 mg via INTRAVENOUS
  Filled 2014-12-20 (×4): qty 25

## 2014-12-20 MED ORDER — BISACODYL 5 MG PO TBEC: 5.0000 mg | DELAYED_RELEASE_TABLET | Freq: Every day | ORAL | Status: DC | PRN

## 2014-12-20 MED ORDER — SODIUM CHLORIDE 0.9 % IV SOLN
12.5000 mg | Freq: Four times a day (QID) | INTRAVENOUS | Status: DC | PRN
  Filled 2014-12-20: qty 0.25

## 2014-12-20 MED ORDER — KETOROLAC TROMETHAMINE 30 MG/ML IJ SOLN
30.0000 mg | Freq: Four times a day (QID) | INTRAMUSCULAR | Status: DC
  Administered 2014-12-20 – 2014-12-22 (×8): 30 mg via INTRAVENOUS
  Filled 2014-12-20 (×11): qty 1

## 2014-12-20 MED ORDER — NALOXONE HCL 0.4 MG/ML IJ SOLN: 0.4000 mg | INTRAMUSCULAR | Status: DC | PRN

## 2014-12-20 MED ORDER — NICOTINE 14 MG/24HR TD PT24
14.0000 mg | MEDICATED_PATCH | TRANSDERMAL | Status: DC
  Administered 2014-12-20 – 2014-12-21 (×2): 14 mg via TRANSDERMAL
  Filled 2014-12-20 (×3): qty 1

## 2014-12-20 MED ORDER — DIPHENHYDRAMINE HCL 25 MG PO CAPS: 25.0000 mg | ORAL_CAPSULE | ORAL | Status: DC | PRN

## 2014-12-20 NOTE — H&P (Signed)
History and Physical  Patient Name: Matthew Shannon     IRS:[id number]    DOB: [birth year]    DOA: 12/19/2014 Referring physician: Pryor Curia, MD PCP: Angelica Chessman, MD      Chief Complaint: Pain crisis  HPI: Matthew Shannon is a 24 y.o. male with a past medical history significant for sickle cell disease with chronic pain who presents with pain crisis.  The patient appears to have daily pain for which she is seen either in the ER or the sickle cell day clinic. He states that today he has had worsening of his pain since being in this ER this morning, and that his oral pain medicines don't help.  His pain is located in the low back and long bones of the legs, is sharp and aching in character, and severe in intensity. He denies fever, cough, chest pain, dysuria, hematuria, leg swelling.  The patient was treated in the ED with IV fluids, diphenhydramine 25 mg IV, and hydromorphone 1 mg IV 2 followed by hydromorphone 2 mg IV 1.  He initially told me that he was ready to go home, and Dr. Leonides Schanz obliged and discharged him.  He now reports that he was able to ambulate to the car but as soon as he got outside in the cold air, his pain returned with the same intensity as before.     Review of Systems:  Pt complains of low back pain, leg pain. All other systems negative except as just noted or noted in the history of present illness.   Allergies: No Known Allergies  Home medications: 1. Folate 2. Hydroxyurea 1000 mg daily 3. MS Contin 30 mg twice daily 4. Oxycodone 10 mg every 4 hours as needed  Past medical history: 1. 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 Smoking  Past surgical history: 1. Cholecystectomy  Family history:  Brother, Sickle cell disease, asthma. Father, diabetes.   Social History:  Patient lives in [place] with his girlfriend and HER-2 children. He is an active smoker. He is not able to work right now. He is from [place], [place]iginally.        Physical Exam: BP 113/68 mmHg  Pulse 90  Temp(Src) 98.5 F (36.9 C) (Oral)  Resp 14  SpO2 99%  General appearance: Well-developed, adult male, alert but uncomfortable.   Eyes: Slightly icteric, conjunctiva pink, lids and lashes normal.     ENT: No nasal deformity, discharge, or epistaxis.  Skin: Warm and dry.  No jaundice.  Dry lightly scaling rash on back/neck. Cardiac: RRR, nl S1-S2, hyperdynamic, flow murmur.  Capillary refill is brisk.   Respiratory: Normal respiratory rate and rhythm.  CTAB without rales or wheezes. Abdomen: Abdomen soft without rigidity.  No TTP. No ascites, distension.   MSK: No deformities or effusions. Neuro: Previously, he was very sleepy and his answers drifted off in mid sentence, but he is now more alert, moving slowly due to pain. Moves all extremities equally and with normal coordination.    Psych: Behavior appropriate.  Affect normal.  No evidence of aural or visual hallucinations or delusions.       Labs on Admission:  The metabolic panel shows mild hypokalemia. The total bilirubin is elevated at 4.1 mg/dL, which is stable from previous. Reticulocyte Percentage is 14.7% The complete blood count shows stable chronic leukocytosis, anemia stable at 8.1 g/dL.   Radiological Exams on Admission: Personally reviewed: Dg Chest 2 View  12/19/2014  CLINICAL DATA:  Sickle cell pain crisis this evening. Chest and back pain. EXAM: CHEST  2 VIEW COMPARISON:  12/16/2014 FINDINGS: The cardiac silhouette, mediastinal and hilar contours are within normal limits and stable. The lungs are clear. No pleural effusion. The bony thorax is intact. Stable thoracic scoliosis. IMPRESSION: No acute cardiopulmonary findings. Electronically Signed   By: Marijo Sanes M.D.   On: 12/19/2014 23:44    EKG: Independently reviewed. Normal sinus rhythm with early repolarization pattern, nonspecific T-wave changes in V5 and 6    Assessment/Plan 1. Sickle cell  pain crisis:  Given the patient's pain is frequent and uncontrolled I offered admission for high-dose PCA. And I think that this would be reasonable. The patient declined.  -High dose hydromorphone PCA -ETCO2 monitoring, frequent pain checks, sickle cell unit protocl -Continue folate and hydroxyurea  2. Hypokalemia:  Supplemented already. -Repeat potassium tomorrow  3. Hyperbilirubinemia:  -Chronic hemolysis  4. Leukocytosis:  Chronic.       DVT PPx: Lovenox Diet: Regular Consultants: None Code Status: Full   Medical decision making: What exists of the patient's previous chart was reviewed in depth and the case was discussed with Dr. Leonides Schanz. Patient seen 4:24 AM on 12/20/2014.  Disposition Plan:  Admit to sickle cell unit on high dose PCA.        Edwin Dada [facility] Pager [phone]

## 2014-12-20 NOTE — Consult Note (Signed)
Please see my admission H&P from later this morning.  Matthew Shannon

## 2014-12-20 NOTE — ED Provider Notes (Signed)
TIME SEEN: 4:15 AM  CHIEF COMPLAINT: Sickle cell pain crisis  HPI: Pt is a 24 y.o. male with history of sickle cell anemia who presents emergency department with back pain, chest pain and lower extremity pain similar to his sickle cell crisis last several days. Patient was just discharged from the emergency department approximately one hour ago for the same. At that time he was offered admission and the hospitalist had seen the patient as he felt like his pain was uncontrolled but he refused admission when the hospitalist came to see him. He states that he walked in the parking lot and when the cold air hit him his pain got worse. Her turns to be admitted.  ROS: See HPI Constitutional: no fever  Eyes: no drainage  ENT: no runny nose   Cardiovascular:  no chest pain  Resp: no SOB  GI: no vomiting GU: no dysuria Integumentary: no rash  Allergy: no hives  Musculoskeletal: no leg swelling  Neurological: no slurred speech ROS otherwise negative  PAST MEDICAL HISTORY/PAST SURGICAL HISTORY:  Past Medical History  Diagnosis Date  . Sickle cell anemia (HCC)     MEDICATIONS:  Prior to Admission medications   Medication Sig Start Date End Date Taking? Authorizing Provider  folic acid (FOLVITE) 1 MG tablet Take 1 tablet (1 mg total) by mouth daily. 10/16/14   Quentin Angstlugbemiga E Jegede, MD  hydroxyurea (HYDREA) 500 MG capsule Take 2 capsules (1,000 mg total) by mouth daily. May take with food to minimize GI side effects. 12/17/14   Massie MaroonLachina M Hollis, FNP  ibuprofen (ADVIL,MOTRIN) 600 MG tablet Take 1 tablet (600 mg total) by mouth every 8 (eight) hours as needed. 12/17/14   Massie MaroonLachina M Hollis, FNP  morphine (MS CONTIN) 30 MG 12 hr tablet Take 1 tablet (30 mg total) by mouth every 12 (twelve) hours. 12/17/14   Massie MaroonLachina M Hollis, FNP  nicotine (NICODERM CQ) 14 mg/24hr patch Place 1 patch (14 mg total) onto the skin daily. 12/17/14   Massie MaroonLachina M Hollis, FNP  ondansetron (ZOFRAN) 8 MG tablet Take 1 tablet (8 mg  total) by mouth every 8 (eight) hours as needed for nausea or vomiting. 10/24/14   Mercedes Camprubi-Soms, PA-C  oxyCODONE (OXY IR/ROXICODONE) 5 MG immediate release tablet Take 1 tablet (5 mg total) by mouth every 4 (four) hours as needed for severe pain. 12/17/14   Massie MaroonLachina M Hollis, FNP    ALLERGIES:  No Known Allergies  SOCIAL HISTORY:  Social History  Substance Use Topics  . Smoking status: Current Every Day Smoker -- 0.25 packs/day for 0 years    Types: Cigarettes  . Smokeless tobacco: Never Used  . Alcohol Use: No    FAMILY HISTORY: Family History  Problem Relation Age of Onset  . Sickle cell anemia Brother     Two brothers  . Asthma Brother   . Diabetes Father     EXAM: BP 113/68 mmHg  Pulse 90  Temp(Src) 98.5 F (36.9 C) (Oral)  Resp 14  SpO2 99% CONSTITUTIONAL: Alert and oriented and responds appropriately to questions. Well-appearing; well-nourished HEAD: Normocephalic EYES: Conjunctivae clear, PERRL ENT: normal nose; no rhinorrhea; moist mucous membranes; pharynx without lesions noted NECK: Supple, no meningismus, no LAD  CARD: RRR; S1 and S2 appreciated; no murmurs, no clicks, no rubs, no gallops RESP: Normal chest excursion without splinting or tachypnea; breath sounds clear and equal bilaterally; no wheezes, no rhonchi, no rales, no hypoxia or respiratory distress, speaking full sentences ABD/GI: Normal bowel sounds; non-distended;  soft, non-tender, no rebound, no guarding, no peritoneal signs BACK:  The back appears normal and is non-tender to palpation, there is no CVA tenderness EXT: Normal ROM in all joints; non-tender to palpation; no edema; normal capillary refill; no cyanosis, no calf tenderness or swelling    SKIN: Normal color for age and race; warm NEURO: Moves all extremities equally, sensation to light touch intact diffusely, cranial nerves II through XII intact PSYCH: The patient's mood and manner are appropriate. Grooming and personal hygiene are  appropriate.  MEDICAL DECISION MAKING: Patient here with sickle cell pain crisis. Patient was offered admission and refused and was discharged one hour ago. Returns for admission for pain control. Discussed Dr. Maryfrances Bunnell with hospitalist service who agrees to admit patient. I will place holding orders for a medical bed. Patient can receive pain medication from PCA when he is on the floor.  No other acute changes per patient.  Patient had stable labs earlier, negative troponin, negative chest x-ray and unchanged EKG.       Matthew Maw Cyanna Neace, DO 12/20/14 [phone]

## 2014-12-20 NOTE — Discharge Instructions (Signed)
Sickle Cell Anemia, Adult Sickle cell anemia is a condition in which red blood cells have an abnormal "sickle" shape. This abnormal shape shortens the cells' life span, which results in a lower than normal concentration of red blood cells in the blood. The sickle shape also causes the cells to clump together and block free blood flow through the blood vessels. As a result, the tissues and organs of the body do not receive enough oxygen. Sickle cell anemia causes organ damage and pain and increases the risk of infection. CAUSES  Sickle cell anemia is a genetic disorder. Those who receive two copies of the gene have the condition, and those who receive one copy have the trait. RISK FACTORS The sickle cell gene is most common in people whose families originated in [place]. Other areas of the globe where sickle cell trait occurs include the Mediterranean, [place] and [place], the [place], and the [place].  SIGNS AND SYMPTOMS  Pain, especially in the extremities, back, chest, or abdomen (common). The pain may start suddenly or may develop following an illness, especially if there is dehydration. Pain can also occur due to overexertion or exposure to extreme temperature changes.  Frequent severe bacterial infections, especially certain types of pneumonia and meningitis.  Pain and swelling in the hands and feet.  Decreased activity.   Loss of appetite.   Change in behavior.  Headaches.  Seizures.  Shortness of breath or difficulty breathing.  Vision changes.  Skin ulcers. Those with the trait may not have symptoms or they may have mild symptoms.  DIAGNOSIS  Sickle cell anemia is diagnosed with blood tests that demonstrate the genetic trait. It is often diagnosed during the newborn period, due to mandatory testing nationwide. A variety of blood tests, X-rays, CT scans, MRI scans, ultrasounds, and lung function tests may also be done to monitor the condition. TREATMENT  Sickle  cell anemia may be treated with:  Medicines. You may be given pain medicines, antibiotic medicines (to treat and prevent infections) or medicines to increase the production of certain types of hemoglobin.  Fluids.  Oxygen.  Blood transfusions. HOME CARE INSTRUCTIONS   Drink enough fluid to keep your urine clear or pale yellow. Increase your fluid intake in hot weather and during exercise.  Do not smoke. Smoking lowers oxygen levels in the blood.   Only take over-the-counter or prescription medicines for pain, fever, or discomfort as directed by your health care provider.  Take antibiotics as directed by your health care provider. Make sure you finish them it even if you start to feel better.   Take supplements as directed by your health care provider.   Consider wearing a medical alert bracelet. This tells anyone caring for you in an emergency of your condition.   When traveling, keep your medical information, health care provider's names, and the medicines you take with you at all times.   If you develop a fever, do not take medicines to reduce the fever right away. This could cover up a problem that is developing. Notify your health care provider.  Keep all follow-up appointments with your health care provider. Sickle cell anemia requires regular medical care. SEEK MEDICAL CARE IF: You have a fever. SEEK IMMEDIATE MEDICAL CARE IF:   You feel dizzy or faint.   You have new abdominal pain, especially on the left side near the stomach area.   You develop a persistent, often uncomfortable and painful penile erection (priapism). If this is not treated immediately it   will lead to impotence.   You have numbness your arms or legs or you have a hard time moving them.   You have a hard time with speech.   You have a fever or persistent symptoms for more than 2-3 days.   You have a fever and your symptoms suddenly get worse.   You have signs or symptoms of infection.  These include:   Chills.   Abnormal tiredness (lethargy).   Irritability.   Poor eating.   Vomiting.   You develop pain that is not helped with medicine.   You develop shortness of breath.  You have pain in your chest.   You are coughing up pus-like or bloody sputum.   You develop a stiff neck.  Your feet or hands swell or have pain.  Your abdomen appears bloated.  You develop joint pain. MAKE SURE YOU:  Understand these instructions.   This information is not intended to replace advice given to you by your health care provider. Make sure you discuss any questions you have with your health care provider.   Document Released: 05/06/2005 Document Revised: 02/16/2014 Document Reviewed: 09/07/2012 Elsevier Interactive Patient Education 2016 Elsevier Inc.  

## 2014-12-20 NOTE — ED Notes (Signed)
Pt presents d/t chronic sickle cell pain.

## 2014-12-20 NOTE — Consult Note (Deleted)
[facility] ED Consult Note  Patient Name: Matthew Shannon     BWG:[id number]    DOB: Apr 18, 1990    DOA: 12/19/2014 Referring physician: Pryor Curia, MD PCP: Angelica Chessman, MD      Chief Complaint: Pain crisis  HPI: Matthew Shannon is a 24 y.o. male with a past medical history significant for sickle cell disease with chronic pain who presents with pain crisis.  I was asked to see the patient by Dr. Cyril Mourning Ward to evaluate for admission for sickle cell pain crisis.  The patient appears to have daily pain for which she is seen either in the ER or the sickle cell day clinic. He states that today he has had worsening of his pain since being in this ER this morning, and that his oral pain medicines don't help.  His pain is located in the low back and long bones of the legs, is sharp and aching in character, and severe in intensity. He denies fever, cough, chest pain, dysuria, hematuria, leg swelling.  In the ED, the patient got IV fluids, diphenhydramine 25 mg IV, and hydromorphone 1 mg IV 2 followed by hydromorphone 2 mg IV 1.  He is sleepy but now tells me that he feels better and thinks he will be able to go home.     Review of Systems:  Pt complains of low back pain, leg pain. All other systems negative except as just noted or noted in the history of present illness.   Allergies: No Known Allergies  Home medications: 1. Folate 2. Hydroxyurea 1000 mg daily 3. MS Contin 30 mg twice daily 4. Oxycodone 10 mg every 4 hours as needed  Past medical history: 1. 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 Smoking  Past surgical history: 1. Cholecystectomy  Family history:  Brother, Sickle cell disease, asthma. Father, diabetes.   Social History:  Patient lives in [place] with his girlfriend and HER-2 children. He is an active smoker. He is not able to work right now. He is from [place], [place] originally.        Physical Exam: BP 109/64 mmHg   Pulse 88  Temp(Src) 98.2 F (36.8 C) (Oral)  Resp 20  SpO2 95% General appearance: Well-developed, adult male, sleeping, and rousable by touch.   Eyes: Slightly icteric, conjunctiva pink, lids and lashes normal.     ENT: No nasal deformity, discharge, or epistaxis.  Skin: Warm and dry.  No jaundice.  Dry lightly scaling rash on back/neck. Cardiac: RRR, nl S1-S2, hyperdynamic, flow murmur.  Capillary refill is brisk.   Respiratory: Normal respiratory rate and rhythm.  CTAB without rales or wheezes. Abdomen: Abdomen soft without rigidity.  No TTP. No ascites, distension.   MSK: No deformities or effusions. Neuro: Sleepy but rousable.  His answers sometimes drifts off in mid sentence. Moves all extremities equally and with normal coordination.    Psych: Behavior appropriate but sleepy.  Affect normal.  No evidence of aural or visual hallucinations or delusions.       Labs on Admission:  The metabolic panel shows mild hypokalemia. The total bilirubin is elevated at 4.1 mg/dL, which is stable from previous. Reticulocyte Percentage is 14.7% The complete blood count shows stable chronic leukocytosis, anemia stable at 8.1 g/dL.   Radiological Exams on Admission: Personally reviewed: Dg Chest 2 View  12/19/2014  CLINICAL DATA:  Sickle cell pain crisis this evening. Chest and back pain. EXAM: CHEST  2 VIEW COMPARISON:  12/16/2014 FINDINGS: The  cardiac silhouette, mediastinal and hilar contours are within normal limits and stable. The lungs are clear. No pleural effusion. The bony thorax is intact. Stable thoracic scoliosis. IMPRESSION: No acute cardiopulmonary findings. Electronically Signed   By: Marijo Sanes M.D.   On: 12/19/2014 23:44    EKG: Independently reviewed. Normal sinus rhythm with early repolarization pattern, nonspecific T-wave changes in V5 and 6    Assessment/Plan 1. Sickle cell pain crisis:  Given the patient's pain is frequent and uncontrolled I offered admission for  high-dose PCA. And I think that this would be reasonable. The patient declined.  -Discharged home with MS Contin 39 g twice daily and oxycodone 10 mg every 4 hours as needed -Continue folate and hydroxyurea  2. Hypokalemia:  -Supplement 40 mEq KCl -Follow-up with Dr. Doreene Burke as previously recommended  3. Hyperbilirubinemia:  -Chronic hemolysis     Edwin Dada [facility] Pager [phone]

## 2014-12-20 NOTE — Progress Notes (Signed)
SICKLE CELL SERVICE PROGRESS NOTE  Matthew HarrisonBobby Shannon HQI:[id number] DOB: 05/02/1990 DOA: 12/20/2014 PCP: Matthew LewandowskyJEGEDE, OLUGBEMIGA, MD  Assessment/Plan:  1. Hb SS with crisis: Pt has had PCA for about 6 hours and used only 2.4 mg as he has been sleeping. He rates pain as 6-7/1- compared to baseline of 3/10. Pain localized ot his low back. Orders reviewed. Continue PCA at current dose. Add Toradol and continue MS Contin and IVF. WIll re-assess tomorrow. 2. Leukocytosis: No evidence of infection.  3. Anemia of chronic disease: At baseline Hb.   Code Status: Full Code Family Communication: N/A Disposition Plan: Not yet ready for discharge  Maurica Omura A.  Pager [phone]. If 7PM-7AM, please contact night-coverage.  12/20/2014, 5:25 PM  LOS: 0 days     Objective: Filed Vitals:   12/20/14 1200 12/20/14 1400 12/20/14 1552 12/20/14 1554  BP:  108/66    Pulse:  81    Temp:  98.6 F (37 C)    TempSrc:  Oral    Resp: 17 12 10 13   Height:      Weight:      SpO2: 91% 100% 100% 100%   Weight change:   Intake/Output Summary (Last 24 hours) at 12/20/14 1725 Last data filed at 12/20/14 1420  Gross per 24 hour  Intake 519.17 ml  Output      0 ml  Net 519.17 ml    General: Alert, awake, oriented x3, in mild distress.  HEENT: [place]/AT PEERL, EOMI, mild icterus Neck: Trachea midline,  no masses, no thyromegal,y no JVD, no carotid bruit OROPHARYNX:  Moist, No exudate/ erythema/lesions.  Heart: Regular rate and rhythm, without murmurs, rubs, gallops, PMI non-displaced, no heaves or thrills on palpation.  Lungs: Clear to auscultation, no wheezing or rhonchi noted. No increased vocal fremitus resonant to percussion  Abdomen: Soft, nontender, nondistended, positive bowel sounds, no masses no hepatosplenomegaly noted.  Neuro: No focal neurological deficits noted cranial nerves II through XII grossly intact.  Strength at baseline in bilateral upper and lower extremities. Musculoskeletal: No warm  swelling or erythema around joints, no spinal tenderness noted.    Data Reviewed: Basic Metabolic Panel:  Recent Labs Lab 12/16/14 1726 12/19/14 2353 12/20/14 0720  NA 140 139  --   K 3.4* 3.4*  --   CL 108 106  --   CO2 27 29  --   GLUCOSE 82 83  --   BUN 6 5*  --   CREATININE 0.45* 0.43* 0.42*  CALCIUM 9.3 9.1  --    Liver Function Tests:  Recent Labs Lab 12/16/14 1726 12/19/14 2353  AST 27 21  ALT 20 14*  ALKPHOS 69 74  BILITOT 3.8* 4.1*  PROT 7.4 7.0  ALBUMIN 4.2 4.3   No results for input(s): LIPASE, AMYLASE in the last 168 hours. No results for input(s): AMMONIA in the last 168 hours. CBC:  Recent Labs Lab 12/16/14 1726 12/19/14 2353 12/20/14 0720  WBC 16.5* 16.7* 15.7*  NEUTROABS 8.7* 10.0*  --   HGB 8.1* 8.1* 7.1*  HCT 22.7* 21.9* 19.8*  MCV 90.1 91.6 92.1  PLT 229 248 225   Cardiac Enzymes: No results for input(s): CKTOTAL, CKMB, CKMBINDEX, TROPONINI in the last 168 hours. BNP (last 3 results) No results for input(s): BNP in the last 8760 hours.  ProBNP (last 3 results) No results for input(s): PROBNP in the last 8760 hours.  CBG: No results for input(s): GLUCAP in the last 168 hours.  No results found for this or any  previous visit (from the past 240 hour(s)).   Studies: Dg Chest 2 View  12/19/2014  CLINICAL DATA:  Sickle cell pain crisis this evening. Chest and back pain. EXAM: CHEST  2 VIEW COMPARISON:  12/16/2014 FINDINGS: The cardiac silhouette, mediastinal and hilar contours are within normal limits and stable. The lungs are clear. No pleural effusion. The bony thorax is intact. Stable thoracic scoliosis. IMPRESSION: No acute cardiopulmonary findings. Electronically Signed   By: Rudie Meyer M.D.   On: 12/19/2014 23:44   Dg Chest 2 View  12/16/2014  CLINICAL DATA:  Upper back pain today.  No known injury.  Smoker. EXAM: CHEST  2 VIEW COMPARISON:  11/27/2014. FINDINGS: Interval borderline enlargement of the cardiac silhouette. Clear  lungs. Mild diffuse peribronchial thickening. Stable mild to moderate scoliosis. Cholecystectomy clips. IMPRESSION: 1. No acute abnormality. 2. Mild chronic bronchitic changes with minimal progression. 3. Stable scoliosis. Electronically Signed   By: Beckie Salts M.D.   On: 12/16/2014 16:58   Dg Chest 2 View  11/27/2014  CLINICAL DATA:  Chest pain starting last night, smoker EXAM: CHEST  2 VIEW COMPARISON:  11/16/2014 FINDINGS: Cardiomediastinal silhouette is stable. There is dextroscoliosis lower thoracic spine. No acute infiltrate or pleural effusion. No pulmonary edema. IMPRESSION: No active cardiopulmonary disease. Dextroscoliosis lower thoracic spine. Electronically Signed   By: Natasha Mead M.D.   On: 11/27/2014 15:47    Scheduled Meds: . [COMPLETED] sodium chloride   Intravenous STAT  . enoxaparin (LOVENOX) injection  40 mg Subcutaneous Q24H  . folic acid  1 mg Oral Daily  . HYDROmorphone   Intravenous 6 times per day  . hydroxyurea  1,000 mg Oral Daily  . morphine  30 mg Oral Q12H  . senna-docusate  1 tablet Oral BID   Continuous Infusions: . dextrose 5 % and 0.45% NaCl 75 mL/hr at 12/20/14 1551    Principal Problem:   Sickle cell pain crisis (HCC) Active Problems:   Sickle cell crisis (HCC)

## 2014-12-21 DIAGNOSIS — D5700 Hb-SS disease with crisis, unspecified: Principal | ICD-10-CM

## 2014-12-21 LAB — BASIC METABOLIC PANEL
ANION GAP: 7 (ref 5–15)
BUN: 5 mg/dL — AB (ref 6–20)
CO2: 28 mmol/L (ref 22–32)
Calcium: 9.1 mg/dL (ref 8.9–10.3)
Chloride: 106 mmol/L (ref 101–111)
Creatinine, Ser: 0.39 mg/dL — ABNORMAL LOW (ref 0.61–1.24)
Glucose, Bld: 90 mg/dL (ref 65–99)
POTASSIUM: 3.5 mmol/L (ref 3.5–5.1)
SODIUM: 141 mmol/L (ref 135–145)

## 2014-12-21 LAB — CBC
HEMATOCRIT: 18.8 % — AB (ref 39.0–52.0)
Hemoglobin: 6.8 g/dL — CL (ref 13.0–17.0)
MCH: 32.9 pg (ref 26.0–34.0)
MCHC: 36.2 g/dL — ABNORMAL HIGH (ref 30.0–36.0)
MCV: 90.8 fL (ref 78.0–100.0)
Platelets: 208 10*3/uL (ref 150–400)
RBC: 2.07 MIL/uL — AB (ref 4.22–5.81)
RDW: 21.2 % — AB (ref 11.5–15.5)
WBC: 16 10*3/uL — AB (ref 4.0–10.5)

## 2014-12-21 NOTE — Progress Notes (Signed)
This RN received shift report that the patient refuses CO2 and O2 PCA monitoring, that the doctor was aware.  Upon MD visit, pt educated on necessity of monitoring.  CO2 monitoring hooked up, O2 monitor not functioning, new PCA machine ordered.

## 2014-12-21 NOTE — Progress Notes (Signed)
SICKLE CELL SERVICE PROGRESS NOTE  Matthew Shannon NUU:[id number] DOB: 1990/03/05 DOA: 12/20/2014 PCP: Jeanann Lewandowsky, MD  Assessment/Plan:  1. Hb SS with crisis: Pt hasused 34.8 mg in the last 24 hours. He rates pain as 4-5/10 compared to baseline of 3/10. Pain localized ot his low back. Will schedule Oxycodone 5 mg every 4 hours and continue PCA for PRN use in anticipation of discharge tomorrow. 2. Leukocytosis: No evidence of infection.  3. Anemia of chronic disease: At baseline Hb.   Code Status: Full Code Family Communication: N/A Disposition Plan: Not yet ready for discharge  Matthew Shannon A.  Pager [phone]. If 7PM-7AM, please contact night-coverage.  12/21/2014, 5:01 PM  LOS: 1 day   Subjective: Pt reports that pain has improved since last night with use of PCA and is localized to his back at an intensity of 4-5/10.   Objective: Filed Vitals:   12/21/14 0404 12/21/14 0608 12/21/14 1150 12/21/14 1600  BP:  111/76 114/72 124/83  Pulse:  78 78 100  Temp:  98.1 F (36.7 C) 98.1 F (36.7 C) 98.8 F (37.1 C)  TempSrc:  Oral Oral Oral  Resp: Height:      Weight:      SpO2: 100% 100% 100% 98%   Weight change:   Intake/Output Summary (Last 24 hours) at 12/21/14 1701 Last data filed at 12/21/14 1648  Gross per 24 hour  Intake 2612.5 ml  Output    450 ml  Net 2162.5 ml    General: Alert, awake, oriented x3, in no apparent distress.  HEENT: [place]/AT PEERL, EOMI, mild icterus Neck: Trachea midline,  no masses, no thyromegal,y no JVD, no carotid bruit OROPHARYNX:  Moist, No exudate/ erythema/lesions.  Heart: Regular rate and rhythm, without murmurs, rubs, gallops, PMI non-displaced, no heaves or thrills on palpation.  Lungs: Clear to auscultation, no wheezing or rhonchi noted. No increased vocal fremitus resonant to percussion  Abdomen: Soft, nontender, nondistended, positive bowel sounds, no masses no hepatosplenomegaly noted.  Neuro: No focal  neurological deficits noted cranial nerves II through XII grossly intact.  Strength at baseline in bilateral upper and lower extremities. Musculoskeletal: No warm swelling or erythema around joints, no spinal tenderness noted.    Data Reviewed: Basic Metabolic Panel:  Recent Labs Lab 12/16/14 1726 12/19/14 2353 12/20/14 0720 12/21/14 0726  NA 140 139  --  141  K 3.4* 3.4*  --  3.5  CL 108 106  --  106  CO2 27 29  --  28  GLUCOSE 82 83  --  90  BUN 6 5*  --  5*  CREATININE 0.45* 0.43* 0.42* 0.39*  CALCIUM 9.3 9.1  --  9.1   Liver Function Tests:  Recent Labs Lab 12/16/14 1726 12/19/14 2353  AST 27 21  ALT 20 14*  ALKPHOS 69 74  BILITOT 3.8* 4.1*  PROT 7.4 7.0  ALBUMIN 4.2 4.3   No results for input(s): LIPASE, AMYLASE in the last 168 hours. No results for input(s): AMMONIA in the last 168 hours. CBC:  Recent Labs Lab 12/16/14 1726 12/19/14 2353 12/20/14 0720 12/21/14 0726  WBC 16.5* 16.7* 15.7* 16.0*  NEUTROABS 8.7* 10.0*  --   --   HGB 8.1* 8.1* 7.1* 6.8*  HCT 22.7* 21.9* 19.8* 18.8*  MCV 90.1 91.6 92.1 90.8  PLT 229 248 225 208   Cardiac Enzymes: No results for input(s): CKTOTAL, CKMB, CKMBINDEX, TROPONINI in the last 168 hours. BNP (last 3 results) No results for  input(s): BNP in the last 8760 hours.  ProBNP (last 3 results) No results for input(s): PROBNP in the last 8760 hours.  CBG: No results for input(s): GLUCAP in the last 168 hours.  No results found for this or any previous visit (from the past 240 hour(s)).   Studies: Dg Chest 2 View  12/19/2014  CLINICAL DATA:  Sickle cell pain crisis this evening. Chest and back pain. EXAM: CHEST  2 VIEW COMPARISON:  12/16/2014 FINDINGS: The cardiac silhouette, mediastinal and hilar contours are within normal limits and stable. The lungs are clear. No pleural effusion. The bony thorax is intact. Stable thoracic scoliosis. IMPRESSION: No acute cardiopulmonary findings. Electronically Signed   By: Rudie MeyerP.   Gallerani M.D.   On: 12/19/2014 23:44   Dg Chest 2 View  12/16/2014  CLINICAL DATA:  Upper back pain today.  No known injury.  Smoker. EXAM: CHEST  2 VIEW COMPARISON:  11/27/2014. FINDINGS: Interval borderline enlargement of the cardiac silhouette. Clear lungs. Mild diffuse peribronchial thickening. Stable mild to moderate scoliosis. Cholecystectomy clips. IMPRESSION: 1. No acute abnormality. 2. Mild chronic bronchitic changes with minimal progression. 3. Stable scoliosis. Electronically Signed   By: Beckie SaltsSteven  Reid M.D.   On: 12/16/2014 16:58   Dg Chest 2 View  11/27/2014  CLINICAL DATA:  Chest pain starting last night, smoker EXAM: CHEST  2 VIEW COMPARISON:  11/16/2014 FINDINGS: Cardiomediastinal silhouette is stable. There is dextroscoliosis lower thoracic spine. No acute infiltrate or pleural effusion. No pulmonary edema. IMPRESSION: No active cardiopulmonary disease. Dextroscoliosis lower thoracic spine. Electronically Signed   By: Natasha MeadLiviu  Pop M.D.   On: 11/27/2014 15:47    Scheduled Meds: . enoxaparin (LOVENOX) injection  40 mg Subcutaneous Q24H  . folic acid  1 mg Oral Daily  . HYDROmorphone   Intravenous 6 times per day  . hydroxyurea  1,000 mg Oral Daily  . ketorolac  30 mg Intravenous 4 times per day  . morphine  30 mg Oral Q12H  . nicotine  14 mg Transdermal Q24H  . potassium chloride  40 mEq Oral Daily  . senna-docusate  1 tablet Oral BID   Continuous Infusions: . dextrose 5 % and 0.45% NaCl 75 mL/hr (12/21/14 0420)    Time spent 25 minutes.

## 2014-12-21 NOTE — Progress Notes (Signed)
CRITICAL VALUE ALERT  Critical value received:  Hgb 6.8  Date of notification:  12/21/14  Time of notification:  0740  Critical value read back:Yes.    Nurse who received alert:  Lorenda IshiharaBonnie Isel Skufca RN  MD notified (1st page):   Time of first page:  [phone]  MD notified (2nd page): Ashley RoyaltyMatthews MD  Time of second page: [phone]  Responding MD:  Ashley RoyaltyMatthews MD  Time MD responded:  [phone]

## 2014-12-22 LAB — CBC WITH DIFFERENTIAL/PLATELET
BASOS ABS: 0.2 10*3/uL — AB (ref 0.0–0.1)
Basophils Relative: 1 %
EOS ABS: 1 10*3/uL — AB (ref 0.0–0.7)
Eosinophils Relative: 6 %
HEMATOCRIT: 19.3 % — AB (ref 39.0–52.0)
HEMOGLOBIN: 6.9 g/dL — AB (ref 13.0–17.0)
LYMPHS PCT: 27 %
Lymphs Abs: 4.4 10*3/uL — ABNORMAL HIGH (ref 0.7–4.0)
MCH: 32.9 pg (ref 26.0–34.0)
MCHC: 35.8 g/dL (ref 30.0–36.0)
MCV: 91.9 fL (ref 78.0–100.0)
MONOS PCT: 11 %
Monocytes Absolute: 1.8 10*3/uL — ABNORMAL HIGH (ref 0.1–1.0)
NEUTROS ABS: 9 10*3/uL — AB (ref 1.7–7.7)
Neutrophils Relative %: 55 %
Platelets: 217 10*3/uL (ref 150–400)
RBC: 2.1 MIL/uL — AB (ref 4.22–5.81)
RDW: 22.6 % — AB (ref 11.5–15.5)
WBC: 16.4 10*3/uL — AB (ref 4.0–10.5)

## 2014-12-22 LAB — OPIATES/OPIOIDS (LC/MS-MS)
Codeine Urine: NEGATIVE ng/mL (ref <50)
HYDROCODONE: NEGATIVE ng/mL (ref <50)
Hydromorphone: 230 ng/mL — AB (ref <50)
Morphine Urine: NEGATIVE ng/mL (ref <50)
NOROXYCODONE, UR: 925 ng/mL — AB (ref <50)
Norhydrocodone, Ur: NEGATIVE ng/mL (ref <50)
OXYCODONE, UR: 139 ng/mL — AB (ref <50)
Oxymorphone: 323 ng/mL — AB (ref <50)

## 2014-12-22 LAB — PRESCRIPTION MONITORING PROFILE (SOLSTAS)
AMPHETAMINE/METH: NEGATIVE ng/mL
BENZODIAZEPINE SCREEN, URINE: NEGATIVE ng/mL
BUPRENORPHINE, URINE: NEGATIVE ng/mL
Barbiturate Screen, Urine: NEGATIVE ng/mL
CARISOPRODOL, URINE: NEGATIVE ng/mL
CREATININE, URINE: 41.9 mg/dL (ref >20.0)
Cannabinoid Scrn, Ur: NEGATIVE ng/mL
Cocaine Metabolites: NEGATIVE ng/mL
ECSTASY: NEGATIVE ng/mL
Fentanyl, Ur: NEGATIVE ng/mL
MEPERIDINE UR: NEGATIVE ng/mL
Methadone Screen, Urine: NEGATIVE ng/mL
NITRITES URINE, INITIAL: NEGATIVE ug/mL
PH URINE, INITIAL: 6.8 pH (ref 4.5–8.9)
Propoxyphene: NEGATIVE ng/mL
TRAMADOL UR: NEGATIVE ng/mL
Tapentadol, urine: NEGATIVE ng/mL
Zolpidem, Urine: NEGATIVE ng/mL

## 2014-12-22 LAB — RETICULOCYTES: RBC.: 2.1 MIL/uL — AB (ref 4.22–5.81)

## 2014-12-22 LAB — OXYCODONE, URINE (LC/MS-MS)
NOROXYCODONE, UR: 925 ng/mL — AB (ref <50)
OXYCODONE, UR: 139 ng/mL — AB (ref <50)
OXYMORPHONE, URINE: 323 ng/mL — AB (ref <50)

## 2014-12-22 MED ORDER — OXYCODONE HCL 15 MG PO TABS: 15.0000 mg | ORAL_TABLET | ORAL | Status: DC | PRN

## 2014-12-22 NOTE — Discharge Summary (Signed)
Physician Discharge Summary  Patient ID: Matthew Shannon MRN: [id number] DOB/AGE: 26/04/1990 26 y.o.  Admit date: 12/20/2014 Discharge date: 12/22/2014  Admission Diagnoses:  Discharge Diagnoses:  Principal Problem:   Sickle cell pain crisis (HCC) Active Problems:   Sickle cell crisis [facility](HCC)   Discharged Condition: good  Hospital Course: Patient admitted with sickle cell painful crisis. He was treated with IV Dilaudid PCA, Toradol and IVF. He responded to treatment and got better. He was transitioned to oral medications and discharged home. He has not been following up with his PCP and has been going to different hospitals mainly due to lack of medications.  Consults: None  Significant Diagnostic Studies: labs: Serial CBCs and CMPs with no transfusion given  Treatments: IV hydration and analgesia: Dilaudid  Discharge Exam: Blood pressure 110/68, pulse 86, temperature 99 F (37.2 C), temperature source Oral, resp. rate 16, height 5\' 10"  (1.778 m), weight 58.968 kg (130 lb), SpO2 95 %. General appearance: alert, cooperative and no distress Neck: no adenopathy, no carotid bruit, no JVD, supple, symmetrical, trachea midline and thyroid not enlarged, symmetric, no tenderness/mass/nodules Back: symmetric, no curvature. ROM normal. No CVA tenderness. Resp: clear to auscultation bilaterally Chest wall: no tenderness Cardio: regular rate and rhythm, S1, S2 normal, no murmur, click, rub or gallop GI: soft, non-tender; bowel sounds normal; no masses,  no organomegaly Extremities: extremities normal, atraumatic, no cyanosis or edema Skin: Skin color, texture, turgor normal. No rashes or lesions  Disposition: 01-Home or Self Care     Medication List    TAKE these medications        folic acid 1 MG tablet  Commonly known as:  FOLVITE  Take 1 tablet (1 mg total) by mouth daily.     hydroxyurea 500 MG capsule  Commonly known as:  HYDREA  Take 2 capsules (1,000 mg total) by mouth  daily. May take with food to minimize GI side effects.     ibuprofen 600 MG tablet  Commonly known as:  ADVIL,MOTRIN  Take 1 tablet (600 mg total) by mouth every 8 (eight) hours as needed.     morphine 30 MG 12 hr tablet  Commonly known as:  MS CONTIN  Take 1 tablet (30 mg total) by mouth every 12 (twelve) hours.     nicotine 14 mg/24hr patch  Commonly known as:  NICODERM CQ  Place 1 patch (14 mg total) onto the skin daily.     ondansetron 8 MG tablet  Commonly known as:  ZOFRAN  Take 1 tablet (8 mg total) by mouth every 8 (eight) hours as needed for nausea or vomiting.     oxyCODONE 15 MG immediate release tablet  Commonly known as:  ROXICODONE  Take 1 tablet (15 mg total) by mouth every 4 (four) hours as needed for severe pain.         SignedLonia Blood: GARBA,LAWAL 12/22/2014, 1:25 PM

## 2014-12-22 NOTE — Progress Notes (Signed)
Pt discharged to home. Left unit ambulatory accompanied by Myeisha, NT. Left in good condition. Copy of AVS given. Vwilliams,rn.

## 2014-12-22 NOTE — Progress Notes (Signed)
Pt discharged to home. DC instructions given. Prescription x 1 given for pain med. Pt requested refills for hydroxyurea and folic acid. MD made aware. Dr. Mikeal HawthorneGarba to sent refill request to [facility] off [address]. Pt reminded to stop and pick up meds. Voiced understanding and expressed thanks. Refused to be taken down in wheelchair. Awaiting ride. Vwilliams,rn.

## 2014-12-24 ENCOUNTER — Emergency Department (HOSPITAL_COMMUNITY)
Admission: EM | Admit: 2014-12-24 | Discharge: 2014-12-25 | Disposition: A | Discharge status: Home / Self Care | Payer: Medicaid - Out of State | Attending: Emergency Medicine | Admitting: Emergency Medicine

## 2014-12-24 ENCOUNTER — Encounter (HOSPITAL_COMMUNITY): Payer: Self-pay | Admitting: *Deleted

## 2014-12-24 DIAGNOSIS — D5700 Hb-SS disease with crisis, unspecified: Secondary | ICD-10-CM

## 2014-12-24 DIAGNOSIS — Z79899 Other long term (current) drug therapy: Secondary | ICD-10-CM | POA: Diagnosis not present

## 2014-12-24 DIAGNOSIS — F17210 Nicotine dependence, cigarettes, uncomplicated: Secondary | ICD-10-CM | POA: Insufficient documentation

## 2014-12-24 NOTE — ED Notes (Signed)
Patient is alert and oriented x4.  He is complaining of sickle cell pain that  Started two day ago.  Currently he describes his pain in the back and  Rates his pain 7 of 10.

## 2014-12-25 MED ORDER — ONDANSETRON 4 MG PO TBDP
4.0000 mg | ORAL_TABLET | Freq: Once | ORAL | Status: AC
  Administered 2014-12-25: 4 mg via ORAL
  Filled 2014-12-25: qty 1

## 2014-12-25 MED ORDER — DIPHENHYDRAMINE HCL 25 MG PO CAPS
25.0000 mg | ORAL_CAPSULE | Freq: Once | ORAL | Status: AC
  Administered 2014-12-25: 25 mg via ORAL
  Filled 2014-12-25: qty 1

## 2014-12-25 MED ORDER — HYDROMORPHONE HCL 2 MG/ML IJ SOLN
2.0000 mg | Freq: Once | INTRAMUSCULAR | Status: AC
  Administered 2014-12-25: 2 mg via INTRAVENOUS
  Filled 2014-12-25: qty 1
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
## 2014-12-25 NOTE — ED Provider Notes (Signed)
1:50 AM Patient signed out to me by Shanna CiscoJamie Ward, PA-C. Patient pending labs and pain medication for sickle cell pain crisis. Vitals stable and patient afebrile.   3:22 AM Patient requested to be discharged. No further evaluation needed at this time.   Emilia BeckKaitlyn Denisha Hoel, PA-C 12/25/14 [phone]  Devoria AlbeIva Knapp, MD 12/25/14 [phone]

## 2014-12-27 ENCOUNTER — Encounter (HOSPITAL_COMMUNITY): Payer: Self-pay

## 2014-12-27 ENCOUNTER — Emergency Department (HOSPITAL_COMMUNITY)
Admission: EM | Admit: 2014-12-27 | Discharge: 2014-12-28 | Disposition: A | Discharge status: Home / Self Care | Payer: Medicaid - Out of State | Attending: Emergency Medicine | Admitting: Emergency Medicine

## 2014-12-27 DIAGNOSIS — Z79899 Other long term (current) drug therapy: Secondary | ICD-10-CM | POA: Insufficient documentation

## 2014-12-27 DIAGNOSIS — D5700 Hb-SS disease with crisis, unspecified: Secondary | ICD-10-CM | POA: Insufficient documentation

## 2014-12-27 DIAGNOSIS — F17210 Nicotine dependence, cigarettes, uncomplicated: Secondary | ICD-10-CM | POA: Insufficient documentation

## 2014-12-27 DIAGNOSIS — R52 Pain, unspecified: Secondary | ICD-10-CM
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
## 2014-12-27 NOTE — ED Notes (Signed)
Pt complains of stomach and back pain for two days

## 2014-12-28 ENCOUNTER — Emergency Department (HOSPITAL_COMMUNITY)
Admission: EM | Admit: 2014-12-28 | Discharge: 2014-12-28 | Discharge status: Against Medical Advice | Payer: Medicaid - Out of State | Source: Home / Self Care | Attending: Emergency Medicine | Admitting: Emergency Medicine

## 2014-12-28 ENCOUNTER — Encounter (HOSPITAL_COMMUNITY): Payer: Self-pay | Admitting: *Deleted

## 2014-12-28 DIAGNOSIS — Z79899 Other long term (current) drug therapy: Secondary | ICD-10-CM | POA: Insufficient documentation

## 2014-12-28 DIAGNOSIS — F17210 Nicotine dependence, cigarettes, uncomplicated: Secondary | ICD-10-CM | POA: Insufficient documentation

## 2014-12-28 DIAGNOSIS — D5700 Hb-SS disease with crisis, unspecified: Secondary | ICD-10-CM

## 2014-12-28 DIAGNOSIS — D849 Immunodeficiency, unspecified: Secondary | ICD-10-CM

## 2014-12-28 LAB — COMPREHENSIVE METABOLIC PANEL
ALBUMIN: 4.5 g/dL (ref 3.5–5.0)
ALT: 13 U/L — ABNORMAL LOW (ref 17–63)
ANION GAP: 7 (ref 5–15)
AST: 27 U/L (ref 15–41)
Alkaline Phosphatase: 80 U/L (ref 38–126)
BILIRUBIN TOTAL: 3.4 mg/dL — AB (ref 0.3–1.2)
BUN: 7 mg/dL (ref 6–20)
CALCIUM: 9.3 mg/dL (ref 8.9–10.3)
CHLORIDE: 103 mmol/L (ref 101–111)
CO2: 27 mmol/L (ref 22–32)
CREATININE: 0.49 mg/dL — AB (ref 0.61–1.24)
GFR calc Af Amer: >60 mL/min (ref >60)
GLUCOSE: 86 mg/dL (ref 65–99)
POTASSIUM: 3.7 mmol/L (ref 3.5–5.1)
Sodium: 137 mmol/L (ref 135–145)
TOTAL PROTEIN: 7.8 g/dL (ref 6.5–8.1)

## 2014-12-28 LAB — CBC WITH DIFFERENTIAL/PLATELET
BASOS ABS: 0.1 10*3/uL (ref 0.0–0.1)
BASOS PCT: 0 %
EOS ABS: 0.8 10*3/uL — AB (ref 0.0–0.7)
Eosinophils Relative: 6 %
HEMATOCRIT: 21.4 % — AB (ref 39.0–52.0)
HEMOGLOBIN: 7.5 g/dL — AB (ref 13.0–17.0)
Lymphocytes Relative: 31 %
Lymphs Abs: 4.3 10*3/uL — ABNORMAL HIGH (ref 0.7–4.0)
MCH: 33.5 pg (ref 26.0–34.0)
MCHC: 35 g/dL (ref 30.0–36.0)
MCV: 95.5 fL (ref 78.0–100.0)
MONOS PCT: 12 %
Monocytes Absolute: 1.6 10*3/uL — ABNORMAL HIGH (ref 0.1–1.0)
NEUTROS ABS: 7.1 10*3/uL (ref 1.7–7.7)
NEUTROS PCT: 51 %
Platelets: 321 10*3/uL (ref 150–400)
RBC: 2.24 MIL/uL — ABNORMAL LOW (ref 4.22–5.81)
RDW: 20.6 % — ABNORMAL HIGH (ref 11.5–15.5)
WBC: 13.8 10*3/uL — AB (ref 4.0–10.5)

## 2014-12-28 LAB — RETICULOCYTES
RBC.: 2.24 MIL/uL — AB (ref 4.22–5.81)
RETIC COUNT ABSOLUTE: 450.2 10*3/uL — AB (ref 19.0–186.0)
RETIC CT PCT: 20.1 % — AB (ref 0.4–3.1)

## 2014-12-28 LAB — CBC
HCT: 22.3 % — ABNORMAL LOW (ref 39.0–52.0)
Hemoglobin: 7.9 g/dL — ABNORMAL LOW (ref 13.0–17.0)
MCH: 33.6 pg (ref 26.0–34.0)
MCHC: 35.4 g/dL (ref 30.0–36.0)
MCV: 94.9 fL (ref 78.0–100.0)
PLATELETS: 364 10*3/uL (ref 150–400)
RBC: 2.35 MIL/uL — AB (ref 4.22–5.81)
RDW: 19.8 % — AB (ref 11.5–15.5)
WBC: 12.9 10*3/uL — ABNORMAL HIGH (ref 4.0–10.5)

## 2014-12-28 LAB — LIPASE, BLOOD: Lipase: 20 U/L (ref 11–51)

## 2014-12-28 MED ORDER — HYDROMORPHONE HCL 2 MG/ML IJ SOLN
2.0000 mg | Freq: Once | INTRAMUSCULAR | Status: AC
  Administered 2014-12-28: 2 mg via INTRAVENOUS
  Filled 2014-12-28: qty 1

## 2014-12-28 MED ORDER — HYDROMORPHONE HCL 1 MG/ML IJ SOLN
1.0000 mg | INTRAMUSCULAR | Status: DC | PRN
  Administered 2014-12-28 (×2): 1 mg via INTRAVENOUS
  Filled 2014-12-28 (×2): qty 1

## 2014-12-28 MED ORDER — KETOROLAC TROMETHAMINE 30 MG/ML IJ SOLN
30.0000 mg | Freq: Once | INTRAMUSCULAR | Status: AC
Start: 2014-12-28 — End: 2014-12-28
  Administered 2014-12-28: 30 mg via INTRAVENOUS
  Filled 2014-12-28: qty 1

## 2014-12-28 MED ORDER — DIPHENHYDRAMINE HCL 25 MG PO CAPS
25.0000 mg | ORAL_CAPSULE | Freq: Once | ORAL | Status: AC
  Administered 2014-12-28: 25 mg via ORAL
  Filled 2014-12-28: qty 1

## 2014-12-28 MED ORDER — ONDANSETRON HCL 4 MG/2ML IJ SOLN
4.0000 mg | Freq: Once | INTRAMUSCULAR | Status: AC
  Administered 2014-12-28: 4 mg via INTRAVENOUS
  Filled 2014-12-28: qty 2

## 2014-12-28 MED ORDER — DIPHENHYDRAMINE HCL 25 MG PO CAPS
50.0000 mg | ORAL_CAPSULE | Freq: Once | ORAL | Status: AC
  Administered 2014-12-28: 50 mg via ORAL
  Filled 2014-12-28: qty 2

## 2014-12-28 MED ORDER — SODIUM CHLORIDE 0.9 % IV BOLUS (SEPSIS)
1000.0000 mL | Freq: Once | INTRAVENOUS | Status: AC
  Administered 2014-12-28: 1000 mL via INTRAVENOUS
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
## 2014-12-28 NOTE — ED Notes (Addendum)
Pt left after removing his own IV.

## 2014-12-28 NOTE — ED Notes (Signed)
Nurse currently start US IV

## 2014-12-28 NOTE — ED Notes (Signed)
Went to do hourly rounding and patient removed his IV and walked out of hospital.

## 2014-12-28 NOTE — ED Notes (Signed)
Per EMS, pt complains of sickle cell pain crisis for the past 3-4 days. Pt took percocet 3 hours ago with no relief. Pt states the pain is all over his pain, 8/10 pain.

## 2014-12-30 ENCOUNTER — Encounter (HOSPITAL_COMMUNITY): Payer: Self-pay | Admitting: *Deleted

## 2014-12-30 ENCOUNTER — Observation Stay (EMERGENCY_DEPARTMENT_HOSPITAL)
Admission: EM | Admit: 2014-12-30 | Discharge: 2014-12-31 | Disposition: A | Discharge status: Home / Self Care | Payer: Medicaid - Out of State | Source: Home / Self Care | Attending: Emergency Medicine | Admitting: Emergency Medicine

## 2014-12-30 ENCOUNTER — Emergency Department (HOSPITAL_COMMUNITY)
Admission: EM | Admit: 2014-12-30 | Discharge: 2014-12-30 | Disposition: A | Discharge status: Home / Self Care | Payer: Medicaid - Out of State | Attending: Emergency Medicine | Admitting: Emergency Medicine

## 2014-12-30 ENCOUNTER — Encounter (HOSPITAL_COMMUNITY): Payer: Self-pay | Admitting: Emergency Medicine

## 2014-12-30 DIAGNOSIS — G8929 Other chronic pain: Secondary | ICD-10-CM | POA: Diagnosis not present

## 2014-12-30 DIAGNOSIS — F1120 Opioid dependence, uncomplicated: Secondary | ICD-10-CM | POA: Insufficient documentation

## 2014-12-30 DIAGNOSIS — D5700 Hb-SS disease with crisis, unspecified: Secondary | ICD-10-CM

## 2014-12-30 DIAGNOSIS — Z79899 Other long term (current) drug therapy: Secondary | ICD-10-CM | POA: Insufficient documentation

## 2014-12-30 DIAGNOSIS — D638 Anemia in other chronic diseases classified elsewhere: Secondary | ICD-10-CM | POA: Insufficient documentation

## 2014-12-30 DIAGNOSIS — D72829 Elevated white blood cell count, unspecified: Secondary | ICD-10-CM | POA: Diagnosis not present

## 2014-12-30 DIAGNOSIS — Z9049 Acquired absence of other specified parts of digestive tract: Secondary | ICD-10-CM | POA: Insufficient documentation

## 2014-12-30 DIAGNOSIS — F17210 Nicotine dependence, cigarettes, uncomplicated: Secondary | ICD-10-CM | POA: Diagnosis not present

## 2014-12-30 LAB — COMPREHENSIVE METABOLIC PANEL
ALK PHOS: 80 U/L (ref 38–126)
ALT: 16 U/L — AB (ref 17–63)
AST: 36 U/L (ref 15–41)
Albumin: 4.6 g/dL (ref 3.5–5.0)
Anion gap: 8 (ref 5–15)
BUN: 6 mg/dL (ref 6–20)
CALCIUM: 9.5 mg/dL (ref 8.9–10.3)
CHLORIDE: 105 mmol/L (ref 101–111)
CO2: 26 mmol/L (ref 22–32)
CREATININE: 0.47 mg/dL — AB (ref 0.61–1.24)
GFR calc Af Amer: >60 mL/min (ref >60)
Glucose, Bld: 99 mg/dL (ref 65–99)
Potassium: 4.2 mmol/L (ref 3.5–5.1)
Sodium: 139 mmol/L (ref 135–145)
Total Bilirubin: 4.3 mg/dL — ABNORMAL HIGH (ref 0.3–1.2)
Total Protein: 8.2 g/dL — ABNORMAL HIGH (ref 6.5–8.1)

## 2014-12-30 LAB — CBC WITH DIFFERENTIAL/PLATELET
Basophils Absolute: 0.1 10*3/uL (ref 0.0–0.1)
Basophils Relative: 1 %
EOS ABS: 0.7 10*3/uL (ref 0.0–0.7)
EOS PCT: 4 %
HCT: 24.2 % — ABNORMAL LOW (ref 39.0–52.0)
Hemoglobin: 8.4 g/dL — ABNORMAL LOW (ref 13.0–17.0)
LYMPHS ABS: 3.2 10*3/uL (ref 0.7–4.0)
Lymphocytes Relative: 20 %
MCH: 32.8 pg (ref 26.0–34.0)
MCHC: 34.7 g/dL (ref 30.0–36.0)
MCV: 94.5 fL (ref 78.0–100.0)
MONOS PCT: 10 %
Monocytes Absolute: 1.7 10*3/uL — ABNORMAL HIGH (ref 0.1–1.0)
Neutro Abs: 10.5 10*3/uL — ABNORMAL HIGH (ref 1.7–7.7)
Neutrophils Relative %: 65 %
PLATELETS: 392 10*3/uL (ref 150–400)
RBC: 2.56 MIL/uL — AB (ref 4.22–5.81)
RDW: 19.9 % — AB (ref 11.5–15.5)
WBC: 16.2 10*3/uL — AB (ref 4.0–10.5)

## 2014-12-30 LAB — RETICULOCYTES
RBC.: 2.56 MIL/uL — ABNORMAL LOW (ref 4.22–5.81)
RETIC COUNT ABSOLUTE: 366.1 10*3/uL — AB (ref 19.0–186.0)
Retic Ct Pct: 14.3 % — ABNORMAL HIGH (ref 0.4–3.1)

## 2014-12-30 LAB — LIPASE, BLOOD: LIPASE: 24 U/L (ref 11–51)

## 2014-12-30 MED ORDER — HYDROMORPHONE HCL 2 MG/ML IJ SOLN: 0.0250 mg/kg | INTRAMUSCULAR | Status: AC

## 2014-12-30 MED ORDER — DIPHENHYDRAMINE HCL 50 MG/ML IJ SOLN
25.0000 mg | Freq: Once | INTRAMUSCULAR | Status: AC
  Administered 2014-12-30: 25 mg via INTRAVENOUS
  Filled 2014-12-30: qty 1

## 2014-12-30 MED ORDER — HYDROMORPHONE HCL 2 MG/ML IJ SOLN
2.0000 mg | Freq: Once | INTRAMUSCULAR | Status: AC
  Administered 2014-12-30: 2 mg via INTRAVENOUS
  Filled 2014-12-30: qty 1

## 2014-12-30 MED ORDER — HYDROMORPHONE 1 MG/ML IV SOLN
INTRAVENOUS | Status: DC
  Administered 2014-12-31: 1 mg via INTRAVENOUS
  Administered 2014-12-31: 7.2 mg via INTRAVENOUS
  Filled 2014-12-30: qty 25

## 2014-12-30 MED ORDER — HEPARIN SODIUM (PORCINE) 5000 UNIT/ML IJ SOLN
5000.0000 [IU] | Freq: Three times a day (TID) | INTRAMUSCULAR | Status: DC
  Filled 2014-12-30 (×4): qty 1

## 2014-12-30 MED ORDER — ONDANSETRON HCL 4 MG/2ML IJ SOLN
4.0000 mg | INTRAMUSCULAR | Status: AC
  Administered 2014-12-30: 4 mg via INTRAVENOUS
  Filled 2014-12-30: qty 2

## 2014-12-30 MED ORDER — FOLIC ACID 1 MG PO TABS
1.0000 mg | ORAL_TABLET | Freq: Every day | ORAL | Status: DC
  Administered 2014-12-31: 1 mg via ORAL
  Filled 2014-12-30: qty 1

## 2014-12-30 MED ORDER — HYDROMORPHONE HCL 1 MG/ML IJ SOLN
1.0000 mg | Freq: Once | INTRAMUSCULAR | Status: AC
  Administered 2014-12-30: 1 mg via INTRAVENOUS
  Filled 2014-12-30 (×2): qty 1

## 2014-12-30 MED ORDER — DIPHENHYDRAMINE HCL 50 MG/ML IJ SOLN: 12.5000 mg | Freq: Four times a day (QID) | INTRAMUSCULAR | Status: DC | PRN

## 2014-12-30 MED ORDER — OXYCODONE HCL ER 15 MG PO T12A
15.0000 mg | EXTENDED_RELEASE_TABLET | Freq: Two times a day (BID) | ORAL | Status: DC
  Administered 2014-12-31: 15 mg via ORAL
  Filled 2014-12-30: qty 1

## 2014-12-30 MED ORDER — SODIUM CHLORIDE 0.9 % IJ SOLN: 9.0000 mL | INTRAMUSCULAR | Status: DC | PRN

## 2014-12-30 MED ORDER — HYDROXYUREA 500 MG PO CAPS
1000.0000 mg | ORAL_CAPSULE | Freq: Every day | ORAL | Status: DC
  Administered 2014-12-31: 1000 mg via ORAL
  Filled 2014-12-30: qty 2

## 2014-12-30 MED ORDER — ONDANSETRON HCL 4 MG/2ML IJ SOLN: 4.0000 mg | Freq: Four times a day (QID) | INTRAMUSCULAR | Status: DC | PRN

## 2014-12-30 MED ORDER — NALOXONE HCL 0.4 MG/ML IJ SOLN: 0.4000 mg | INTRAMUSCULAR | Status: DC | PRN

## 2014-12-30 MED ORDER — POLYETHYLENE GLYCOL 3350 17 G PO PACK
17.0000 g | PACK | Freq: Every day | ORAL | Status: DC | PRN
  Filled 2014-12-30: qty 1

## 2014-12-30 MED ORDER — HYDROMORPHONE 1 MG/ML IV SOLN
INTRAVENOUS | Status: DC
  Administered 2014-12-31: 1 mg via INTRAVENOUS

## 2014-12-30 MED ORDER — KETOROLAC TROMETHAMINE 30 MG/ML IJ SOLN
30.0000 mg | Freq: Four times a day (QID) | INTRAMUSCULAR | Status: DC
  Administered 2014-12-31 (×2): 30 mg via INTRAVENOUS
  Filled 2014-12-30 (×6): qty 1

## 2014-12-30 MED ORDER — SENNOSIDES-DOCUSATE SODIUM 8.6-50 MG PO TABS
1.0000 | ORAL_TABLET | Freq: Two times a day (BID) | ORAL | Status: DC
  Administered 2014-12-31: 1 via ORAL
  Filled 2014-12-30 (×2): qty 1

## 2014-12-30 MED ORDER — DIPHENHYDRAMINE HCL 12.5 MG/5ML PO ELIX: 12.5000 mg | ORAL_SOLUTION | Freq: Four times a day (QID) | ORAL | Status: DC | PRN

## 2014-12-30 MED ORDER — DEXTROSE-NACL 5-0.45 % IV SOLN
INTRAVENOUS | Status: DC
  Administered 2014-12-30: 14:00:00 via INTRAVENOUS

## 2014-12-30 MED ORDER — HYDROMORPHONE HCL 2 MG/ML IJ SOLN
0.0250 mg/kg | INTRAMUSCULAR | Status: AC
  Administered 2014-12-30: 1.5 mg via INTRAVENOUS
  Filled 2014-12-30: qty 1

## 2014-12-30 NOTE — ED Notes (Signed)
Pt is aware or urine sample

## 2014-12-30 NOTE — ED Notes (Signed)
MD at bedside. 

## 2014-12-30 NOTE — ED Notes (Addendum)
Pt. reports persistent pain at entire back - sickle cell crisis onset 4 days ago unrelieved by prescription Percocet  , [facility] for [facility] same complaint blood tests done and received multiple doses of Dilaudid and was discharged home , denies emesis or fever .

## 2014-12-30 NOTE — ED Notes (Signed)
Patient states he is having back pain and abdominal [facility].  Patient is taking oxycodone 5 mg for pain at home with no relief.  Patient endorses nausea, but denies vomiting and diarrhea.  Patient denies other symptoms and is in no distress.
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
## 2014-12-30 NOTE — ED Notes (Signed)
Urine sample requested.

## 2014-12-30 NOTE — Discharge Instructions (Signed)
1. Medications: usual home medications 2. Treatment: rest, drink plenty of fluids 3. Follow Up: please followup with your primary doctor for discussion of your diagnoses and further evaluation after today's visit; if you do not have a primary care doctor use the resource guide provided to find one; please return to the ER for severe pain, new or worsening symptoms   Sickle Cell Anemia, Adult Sickle cell anemia is a condition in which red blood cells have an abnormal "sickle" shape. This abnormal shape shortens the cells' life span, which results in a lower than normal concentration of red blood cells in the blood. The sickle shape also causes the cells to clump together and block free blood flow through the blood vessels. As a result, the tissues and organs of the body do not receive enough oxygen. Sickle cell anemia causes organ damage and pain and increases the risk of infection. CAUSES  Sickle cell anemia is a genetic disorder. Those who receive two copies of the gene have the condition, and those who receive one copy have the trait. RISK FACTORS The sickle cell gene is most common in people whose families originated in [place]. Other areas of the globe where sickle cell trait occurs include the Mediterranean, [place], the [place], and the [place].  SIGNS AND SYMPTOMS  Pain, especially in the extremities, back, chest, or abdomen (common). The pain may start suddenly or may develop following an illness, especially if there is dehydration. Pain can also occur due to overexertion or exposure to extreme temperature changes.  Frequent severe bacterial infections, especially certain types of pneumonia and meningitis.  Pain and swelling in the hands and feet.  Decreased activity.   Loss of appetite.   Change in behavior.  Headaches.  Seizures.  Shortness of breath or difficulty breathing.  Vision changes.  Skin ulcers. Those with the trait may not have symptoms  or they may have mild symptoms.  DIAGNOSIS  Sickle cell anemia is diagnosed with blood tests that demonstrate the genetic trait. It is often diagnosed during the newborn period, due to mandatory testing nationwide. A variety of blood tests, X-rays, CT scans, MRI scans, ultrasounds, and lung function tests may also be done to monitor the condition. TREATMENT  Sickle cell anemia may be treated with:  Medicines. You may be given pain medicines, antibiotic medicines (to treat and prevent infections) or medicines to increase the production of certain types of hemoglobin.  Fluids.  Oxygen.  Blood transfusions. HOME CARE INSTRUCTIONS   Drink enough fluid to keep your urine clear or pale yellow. Increase your fluid intake in hot weather and during exercise.  Do not smoke. Smoking lowers oxygen levels in the blood.   Only take over-the-counter or prescription medicines for pain, fever, or discomfort as directed by your health care provider.  Take antibiotics as directed by your health care provider. Make sure you finish them it even if you start to feel better.   Take supplements as directed by your health care provider.   Consider wearing a medical alert bracelet. This tells anyone caring for you in an emergency of your condition.   When traveling, keep your medical information, health care provider's names, and the medicines you take with you at all times.   If you develop a fever, do not take medicines to reduce the fever right away. This could cover up a problem that is developing. Notify your health care provider.  Keep all follow-up appointments with your health care provider.  Sickle cell anemia requires regular medical care. SEEK MEDICAL CARE IF: You have a fever. SEEK IMMEDIATE MEDICAL CARE IF:   You feel dizzy or faint.   You have new abdominal pain, especially on the left side near the stomach area.   You develop a persistent, often uncomfortable and painful penile  erection (priapism). If this is not treated immediately it will lead to impotence.   You have numbness your arms or legs or you have a hard time moving them.   You have a hard time with speech.   You have a fever or persistent symptoms for more than 2-3 days.   You have a fever and your symptoms suddenly get worse.   You have signs or symptoms of infection. These include:   Chills.   Abnormal tiredness (lethargy).   Irritability.   Poor eating.   Vomiting.   You develop pain that is not helped with medicine.   You develop shortness of breath.  You have pain in your chest.   You are coughing up pus-like or bloody sputum.   You develop a stiff neck.  Your feet or hands swell or have pain.  Your abdomen appears bloated.  You develop joint pain. MAKE SURE YOU:  Understand these instructions.   This information is not intended to replace advice given to you by your health care provider. Make sure you discuss any questions you have with your health care provider.   Document Released: 05/06/2005 Document Revised: 02/16/2014 Document Reviewed: 09/07/2012 Elsevier Interactive Patient Education 2016 [employer].   Emergency Department Resource Guide 1) Find a Doctor and Pay Out of Pocket Although you won't have to find out who is covered by your insurance plan, it is a good idea to ask around and get recommendations. You will then need to call the office and see if the doctor you have chosen will accept you as a new patient and what types of options they offer for patients who are self-pay. Some doctors offer discounts or will set up payment plans for their patients who do not have insurance, but you will need to ask so you aren't surprised when you get to your appointment.  2) Contact Your Local Health Department Not all health departments have doctors that can see patients for sick visits, but many do, so it is worth a call to see if yours does. If you  don't know where your local health department is, you can check in your phone book. The CDC also has a tool to help you locate your state's health department, and many state websites also have listings of all of their local health departments.  3) Find a Walk-in Clinic If your illness is not likely to be very severe or complicated, you may want to try a walk in clinic. These are popping up all over the country in pharmacies, drugstores, and shopping centers. They're usually staffed by nurse practitioners or physician assistants that have been trained to treat common illnesses and complaints. They're usually fairly quick and inexpensive. However, if you have serious medical issues or chronic medical problems, these are probably not your best option.  No Primary Care Doctor: - Call Health Connect at  [phone] - they can help you locate a primary care doctor that  accepts your insurance, provides certain services, etc. - Physician Referral Service- [phone]  Chronic Pain Problems: Organization         Address  Phone   Notes  [facility]  (  [phone] Patients need to be referred by their primary care doctor.   Medication Assistance: Organization         Address  Phone   Notes  [facility] [address] [place] [place], [place] [address] [phone] --Must be a resident of [facility] -- Must have NO insurance coverage whatsoever (no Medicaid/ Medicare, etc.) -- The pt. MUST have a primary care doctor that directs their care regularly and follows them in the community   [facility]  [phone]   [employer]  [phone]    Agencies that provide inexpensive medical care: Organization         Address  Phone   Notes  [facility]  [phone]   [facility]    [phone]   [facility] [address] [place], [place] [address] [phone]   [facility] [address], [place] [phone]   Planned Parenthood    [phone]   [facility]    [phone]   Community Health and [facility]  [address], [place] Phone:  [phone], Fax:  [phone] Hours of Operation:  9 am - 6 pm, M-F.  Also accepts Medicaid/Medicare and self-pay.  [facility]  [address], [place] Phone: [phone], Fax: [phone]. Hours of Operation:  8:30 am - 5:30 pm, M-F.  Also accepts Medicaid and self-pay.  [facility] [place] [address] [place] Phone: [phone]   [facility] [address] Natasha Bence [place], [place] [phone], Ext. [phone] Mondays & Thursdays: 7-9 AM.  First 15 patients are seen on a first come, first serve basis.    Medicaid-accepting [facility] Providers:  Organization         Address  Phone   Notes  [facility] [address], [place] [phone] Also accepts self-pay patients.  [facility] [address] Laurell Josephs [place], [place]  [phone]   [facility] [address], [place] [phone]   [facility] [address], [place] [phone]   Renaye Rakers [address], [place]   [phone] Only accepts [place] Access [place] patients after they have their name applied to their card.   Self-Pay (no insurance) in [facility]:  Organization         Address  Phone   Notes  Sickle Cell Patients, [facility] [address] [place], [place] [phone]   [facility] [address] [place], [place] [phone]   [facility] [address]  [address],  [phone]   [facility]/Dr. Osei-Bonsu  [address], [place] or [address], [place] [phone] Phone  number for both [place] and [place] locations is the same.  Urgent Medical and [facility] [address], [place] [phone]   [facility] [address] [place] or [address] [phone]   [facility] [address] [place] [phone], phone; [phone], fax Sees patients 1st and 3rd Saturday of every month.  Must not qualify for public or private insurance (i.e. Medicaid, Medicare,  Health Choice, Veterans' Benefits)  Household income should be no more than 200% of the poverty level The clinic cannot treat you if you are pregnant or think you are pregnant  Sexually transmitted diseases are not treated at the clinic.    Dental Care: Organization         Address  Phone  Notes  [facility] of [facility] [address] [place], [place] [phone] Accepts children up to age 69 who are enrolled in [place] or [place] Health Choice; pregnant women with a Medicaid card; and children who have applied for Medicaid or [place] Health Choice, but were declined, whose parents can pay a reduced fee at time of service.  [facility] of [facility]  [address], [place] [phone] Accepts children up to age 48 who are enrolled in [place] or [place] Health Choice; pregnant women with a Medicaid card; and children who have applied for Medicaid or [place] Health Choice, but were declined, whose parents can pay a reduced fee at time of service.  [facility]  [address] [place], [place] [phone] Patients are seen by appointment only. Walk-ins are not accepted. [facility] will see patients 95 years of age and older. Monday - Tuesday (8am-5pm) Most Wednesdays (8:30-5pm) $30 per visit, cash only  [facility] Adult Dental Access PRO[facility]  [address], [facility] [phone] Patients are seen by appointment only.  Walk-ins are not accepted. [facility] will see patients 57 years of age and older. One Wednesday Evening (Monthly: Volunteer Based).  $30 per visit, cash only  [employer]  [phone] for adults; Children under age 21, call Graduate Pediatric Dentistry at [phone]. Children aged 65-14, please call [phone] to request a pediatric application.  Dental services are provided in all areas of dental care including fillings, crowns and bridges, complete and partial dentures, implants, gum treatment, root canals, and extractions. Preventive care is also provided. Treatment is provided to both adults and children. Patients are selected via a lottery and there is often a waiting list.   [facility] [address] [place]  [phone] [email or web address]   [facility] [address] [place], [place] [phone], Ext. [phone] Second and Fourth Thursday of each month, opens at 6:30 AM; Clinic ends at 9 AM.  Patients are seen on a first-come first-served basis, and a limited number are seen during each clinic.   [facility]  [address] Ether Griffins [place], [place] [phone]   Eligibility Requirements You must have lived in [place], [place], or [place] counties for at least the last three months.   You cannot be eligible for state or federal sponsored [place], including [employer], [place], or [employer].   You generally cannot be eligible for healthcare insurance through your employer.    How to apply: Eligibility screenings are held every Tuesday and Wednesday afternoon from 1:00 pm until 4:00 pm. You do not need an appointment for the interview!  [facility] [address], [place], [place] [phone]   [facility] Health Department  [phone]   [facility] Health Department  [phone]   [facility] Health Department  [phone]    Behavioral Health  Resources in the Community: Intensive Outpatient Programs Organization         Address  Phone  Notes  [facility] [address], [place],  [place] [phone]   [facility] Outpatient [address] [place], [place] [phone]   [facility] [address], [place], [place]  [phone]   [facility] Mental Health [address],  [place], [place] [phone] or [phone]   Substance Abuse Resources Organization         Address  Phone  Notes  [facility] Drug Services  [phone]   [facility]  [phone]   [facility]  [phone]   Floydene Flock  [phone]   Residential & Outpatient Substance Abuse Program  [phone]   Psychological Services Organization         Address  Phone  Notes  [facility]  [phone]   [facility]  [phone]   [facility] [address] [place] [phone] or [phone]    Mobile Crisis Teams Organization         Address  Phone  Notes  [facility], Mobile Crisis Care Unit  [phone]   [facility]  [address] [place], [place] [phone]   Doristine Locks [address] [place] [phone]    Self-Help/Support Groups Organization         Address  Phone             Notes  [facility] [place] - variety of support groups  [phone]- [phone] Call for more information  Narcotics Anonymous (NA), [facility] [address], [employer] [place]  2 meetings at this location   Statistician         Address  Phone  Notes  [facility] [address],    [place]  [phone]   [facility]  [address], [place] [address], [place], [place] [phone]   [facility] [address] [place], [place] [phone] Admissions: 8am-3pm M-F  [facility] [address],    [place], [place] [phone]   [facility] [address] [place], [place], [place] [phone]   [facility] [address],  [place], [place] [phone]   [facility] - Intensive Outpatient [address] Laurell Josephs [address], [place], [place] [phone]   [facility] (Addiction [facility] [address] [place].,  [place], [place] [phone] or [phone]   [facility] ([facility]) [address], [place], [place] [phone] Accepts Medicaid  [facility] [address],  [place] [phone] Substance Abuse/Addiction Treatment   [facility] Organization         Address  Phone  Notes  [facility]  [phone]   Angie Fava, PhD [address] Ervin Knack [place], [place]   [phone] or [phone]   [facility] Behavioral   [address] [place], [place] [phone]   [facility] [address], [place], [place] [phone] Insurance/Medicaid/sponsorship through [facility] and Families [address]                                    [place], [place] [phone] Therapy/tele-psych/case  [facility] [address], [place] [phone]    Dr. Lolly Mustache  [phone]   [facility]  [employer] [facility] Dept. 1) [address] [place] 2) [address] [facility]  [address], [place] °3)  [address], [place] [phone] °[phone] ° °([phone]   °[place] Child Abuse Hotline (336) [phone] or ([phone] (After Hours)    ° ° ° °
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
## 2014-12-30 NOTE — ED Notes (Signed)
Went to update Pt vitals/ remove IV Pt was not in room, IV on table

## 2014-12-31 ENCOUNTER — Telehealth: Payer: Self-pay | Admitting: Internal Medicine

## 2014-12-31 ENCOUNTER — Other Ambulatory Visit: Payer: Self-pay | Admitting: Family Medicine

## 2014-12-31 DIAGNOSIS — G8929 Other chronic pain: Secondary | ICD-10-CM

## 2014-12-31 DIAGNOSIS — D638 Anemia in other chronic diseases classified elsewhere: Secondary | ICD-10-CM | POA: Diagnosis not present

## 2014-12-31 DIAGNOSIS — D5700 Hb-SS disease with crisis, unspecified: Secondary | ICD-10-CM | POA: Diagnosis not present

## 2014-12-31 MED ORDER — DIPHENHYDRAMINE HCL 25 MG PO CAPS: 25.0000 mg | ORAL_CAPSULE | ORAL | Status: DC | PRN

## 2014-12-31 MED ORDER — ONDANSETRON HCL 4 MG/2ML IJ SOLN: 4.0000 mg | Freq: Four times a day (QID) | INTRAMUSCULAR | Status: DC | PRN

## 2014-12-31 MED ORDER — HYDROMORPHONE 1 MG/ML IV SOLN: INTRAVENOUS | Status: DC

## 2014-12-31 MED ORDER — SODIUM CHLORIDE 0.9 % IV SOLN
25.0000 mg | INTRAVENOUS | Status: DC | PRN
  Filled 2014-12-31: qty 0.5

## 2014-12-31 MED ORDER — NALOXONE HCL 0.4 MG/ML IJ SOLN: 0.4000 mg | INTRAMUSCULAR | Status: DC | PRN

## 2014-12-31 MED ORDER — OXYCODONE HCL 15 MG PO TABS: 15.0000 mg | ORAL_TABLET | ORAL | Status: DC | PRN

## 2014-12-31 MED ORDER — DEXTROSE-NACL 5-0.45 % IV SOLN
INTRAVENOUS | Status: DC
  Administered 2014-12-31: 11:00:00 via INTRAVENOUS

## 2014-12-31 MED ORDER — SODIUM CHLORIDE 0.9 % IJ SOLN
9.0000 mL | INTRAMUSCULAR | Status: DC | PRN
Start: 2014-12-31 — End: 2014-12-31

## 2014-12-31 NOTE — Telephone Encounter (Signed)
Called patient to schedule appointment. Phone number is not in service.

## 2014-12-31 NOTE — Progress Notes (Signed)
Spoke with Dr. Marthann SchillerMichelle Shannon, [facility] inpatient services to discuss current medication regimen. Prior to relocating to [place] patient was taking a total of 327-426 mme/day  Reviewed previous notes.  Also, reviewed Matthew Shannon system prior to giving Rx, multiple inconsistencies noted. Discussed pain medication controlled substance refill policy [facility] with [name], he expressed understanding. Patient will need to come in for a post hospital follow-up, he states that he will not be back in town prior to December 1st. Scheduled a follow up appointment with Dr. Hyman HopesJegede on 01/11/2015. Patient was prescribed Oxycodone 15 mg every 4 hours #54 as needed for moderate to severe chronic pain. We discussed the need to take hydroxyurea consistently and frequent hydration.  Meds ordered this encounter  Medications  . oxyCODONE (ROXICODONE) 15 MG immediate release tablet    Sig: Take 1 tablet (15 mg total) by mouth every 4 (four) hours as needed.    Dispense:  54 tablet    Refill:  0    Order Specific Question:  Supervising Provider    Answer:  Matthew Shannon, Matthew E [[id number]]     Massie MaroonHollis,Matthew Keech M, FNP

## 2014-12-31 NOTE — Discharge Summary (Signed)
Matthew Shannon MRN: [id number] DOB/AGE: 11-Oct-1990 24 y.o.  Admit date: 12/30/2014 Discharge date: 12/31/2014  Primary Care Physician:  Matthew Lewandowsky, MD   Discharge Diagnoses:   Patient Active Problem List   Diagnosis Date Noted  . Dental infection 11/17/2014  . Chest pain 11/17/2014  . Anemia 10/09/2014  . Sickle cell crisis (HCC) 10/09/2014  . Tachycardia 09/26/2014  . Sickle cell pain crisis (HCC) 09/24/2014  . Community acquired pneumonia 09/24/2014  . Tobacco abuse 09/24/2014  . CAP (community acquired pneumonia) 09/24/2014    DISCHARGE MEDICATION:   Medication List    TAKE these medications        folic acid 1 MG tablet  Commonly known as:  FOLVITE  Take 1 tablet (1 mg total) by mouth daily.     hydroxyurea 500 MG capsule  Commonly known as:  HYDREA  Take 2 capsules (1,000 mg total) by mouth daily. May take with food to minimize GI side effects.     ibuprofen 600 MG tablet  Commonly known as:  ADVIL,MOTRIN  Take 1 tablet (600 mg total) by mouth every 8 (eight) hours as needed.     morphine 30 MG 12 hr tablet  Commonly known as:  MS CONTIN  Take 1 tablet (30 mg total) by mouth every 12 (twelve) hours.     nicotine 14 mg/24hr patch  Commonly known as:  NICODERM CQ  Place 1 patch (14 mg total) onto the skin daily.     ondansetron 8 MG tablet  Commonly known as:  ZOFRAN  Take 1 tablet (8 mg total) by mouth every 8 (eight) hours as needed for nausea or vomiting.     oxyCODONE 15 MG immediate release tablet  Commonly known as:  ROXICODONE  Take 1 tablet (15 mg total) by mouth every 4 (four) hours as needed for severe pain.          Consults:     SIGNIFICANT DIAGNOSTIC STUDIES:  Dg Chest 2 View  12/19/2014  CLINICAL DATA:  Sickle cell pain crisis this evening. Chest and back pain. EXAM: CHEST  2 VIEW COMPARISON:  12/16/2014 FINDINGS: The cardiac silhouette, mediastinal and hilar contours are within normal limits and stable. The lungs are clear. No  pleural effusion. The bony thorax is intact. Stable thoracic scoliosis. IMPRESSION: No acute cardiopulmonary findings. Electronically Signed   By: Rudie Meyer M.D.   On: 12/19/2014 23:44   Dg Chest 2 View  12/16/2014  CLINICAL DATA:  Upper back pain today.  No known injury.  Smoker. EXAM: CHEST  2 VIEW COMPARISON:  11/27/2014. FINDINGS: Interval borderline enlargement of the cardiac silhouette. Clear lungs. Mild diffuse peribronchial thickening. Stable mild to moderate scoliosis. Cholecystectomy clips. IMPRESSION: 1. No acute abnormality. 2. Mild chronic bronchitic changes with minimal progression. 3. Stable scoliosis. Electronically Signed   By: Beckie Salts M.D.   On: 12/16/2014 16:58     No results found for this or any previous visit (from the past 240 hour(s)).  BRIEF ADMITTING H & P: Matthew Shannon is a 24 y.o. male with h/o sickle cell anemia. Frequent presentations and admissions (this is his 53rd visit in 6 months). He complains of back pain that is unrelieved by home meds or dilaudid in the ED. Requests admit for PCA.    Hospital Course:  Present on Admission:  . Sickle cell crisis (HCC): Pt was started on PCA and used 12.2 mg in the last 11 hours. He has also received 2 doses of Toradol.  He  reports his pain to be 4-5/10 and reports that he feels that if he has some short acting medications he would be able to manage particularly since he also has social pressures to take care of his two young sons. PT is requesting discharge and at present has no medical risk beyond his pain to justify continued hospitalization. I have spoken with Matthew Armenia Hollis, NP at the [facility] and she will write a prescription for Oxycodone 30 mg to get patient to this next appointment on 11/17/2014. At that time the chronic management of his pain will be more fully addressed. In support of the possible inadequacy of the current dose is the fact that patient was previously on Methadone 10 mg q8 hours and Oxycodone  15-30 mg q 4 hours PRN as reported by his previous Hematologist in [facility] (Dr. Katrinka Shannon at [facility], [place]). The difference if doses is from 327-426 mme/day down to 165 mme/day   . Leukocytosis: This is likely reactive  bone marrow activity. No evidence of infection.  . Chronic Pain: Pt was on an extremely high Opioid dose and has tolerance and dependence. He was previously on 327-426 MME(morphine Mg equivalents)/day while in [place]. Currently he is receiving 165 MME.day. I have spoken with the NP Matthew Shannon ) at the [facility] and she will write a prescription for pain medications and further address at his next appointment on 01/17/2015.   Marland Kitchen Anemia of Chronic Disease: Pt at baseline.  Marland Kitchen Psychosocial: Pt has to provide child care for his 2 young sons and has no independent transportation. I have referred patient to the [facility] to speak with someone about community support.  Disposition and Follow-up:  Pt discharged in good condition and is to follow up with his clinic for a prescription for pain medications and an appointment on 11/17/2014.      Discharge Instructions    Activity as tolerated - No restrictions    Complete by:  As directed      Diet general    Complete by:  As directed            DISCHARGE EXAM: General: Alert, awake, oriented x3, in mild distress.  Vital Signs: BP 93/52, HR 67, T 98.2 F (36.8 C), temperature source Oral, RR 9, height  (1.778 m), weight 129 lb (58.514 kg), SpO2 100 %. HEENT: [place]/AT PEERL, EOMI, anicteric Neck: Trachea midline, no masses, no thyromegal,y no JVD, no carotid bruit OROPHARYNX: Moist, No exudate/ erythema/lesions.  Heart: Regular rate and rhythm, without murmurs, rubs, gallops or S3. PMI non-displaced. Exam reveals no decreased pulses. Pulmonary/Chest: Normal effort. Breath sounds normal. No. Apnea. Clear to auscultation,no stridor,  no wheezing and no rhonchi noted. No respiratory  distress and no tenderness noted. Abdomen: Soft, nontender, nondistended, normal bowel sounds, no masses no hepatosplenomegaly noted. No fluid wave and no ascites. There is no guarding or rebound. Neuro: Alert and oriented to person, place and time. Normal motor skills, Displays no atrophy or tremors and exhibits normal muscle tone.  No focal neurological deficits noted cranial nerves II through XII grossly intact. No sensory deficit noted. Strength at baseline in bilateral upper and lower extremities. Gait normal. Musculoskeletal: No warm swelling or erythema around joints, no spinal tenderness noted. Psychiatric: Patient alert and oriented x3, good insight and cognition, good recent to remote recall. Mood, memory, affect and judgement normal Lymph node survey: No cervical axillary or inguinal lymphadenopathy noted. Skin: Skin is warm and  dry. No bruising, no ecchymosis and no rash noted. Pt is not diaphoretic. No erythema. No pallor   Recent Labs  12/30/14 1240  NA 139  K 4.2  CL 105  CO2 26  GLUCOSE 99  BUN 6  CREATININE 0.47*  CALCIUM 9.5    Recent Labs  12/30/14 1240  AST 36  ALT 16*  ALKPHOS 80  BILITOT 4.3*  PROT 8.2*  ALBUMIN 4.6    Recent Labs  12/30/14 1240  LIPASE 24    Recent Labs  12/28/14 1759 12/30/14 1240  WBC 12.9* 16.2*  NEUTROABS  --  10.5*  HGB 7.9* 8.4*  HCT 22.3* 24.2*  MCV 94.9 94.5  PLT 364 392     Total time spent including face to face and decision making was greater than 30 minutes  Signed: Floy Riegler A. 12/31/2014, 12:20 PM

## 2015-01-04 ENCOUNTER — Emergency Department (HOSPITAL_COMMUNITY)
Admission: EM | Admit: 2015-01-04 | Discharge: 2015-01-05 | Disposition: A | Discharge status: Home / Self Care | Payer: Medicaid - Out of State | Attending: Emergency Medicine | Admitting: Emergency Medicine

## 2015-01-04 ENCOUNTER — Encounter (HOSPITAL_COMMUNITY): Payer: Self-pay | Admitting: Emergency Medicine

## 2015-01-04 DIAGNOSIS — D5700 Hb-SS disease with crisis, unspecified: Secondary | ICD-10-CM | POA: Diagnosis not present

## 2015-01-04 DIAGNOSIS — R1084 Generalized abdominal pain: Secondary | ICD-10-CM | POA: Insufficient documentation

## 2015-01-04 DIAGNOSIS — Z79899 Other long term (current) drug therapy: Secondary | ICD-10-CM | POA: Insufficient documentation

## 2015-01-04 DIAGNOSIS — F17210 Nicotine dependence, cigarettes, uncomplicated: Secondary | ICD-10-CM | POA: Insufficient documentation

## 2015-01-04 DIAGNOSIS — R011 Cardiac murmur, unspecified: Secondary | ICD-10-CM | POA: Insufficient documentation

## 2015-01-04 DIAGNOSIS — R109 Unspecified abdominal pain: Secondary | ICD-10-CM

## 2015-01-04 NOTE — ED Notes (Signed)
Pt will wait for IV and get blood draw

## 2015-01-04 NOTE — ED Notes (Signed)
Patient reports that he has had pain x 2-3 days with 10/10 patient to back and stomach. Reports that it is the same as 4 days ago when her was here.

## 2015-01-05 ENCOUNTER — Emergency Department (HOSPITAL_COMMUNITY): Payer: Medicaid - Out of State

## 2015-01-05 LAB — CBC WITH DIFFERENTIAL/PLATELET
BASOS ABS: 0.2 10*3/uL — AB (ref 0.0–0.1)
Basophils Relative: 1 %
EOS ABS: 0.6 10*3/uL (ref 0.0–0.7)
Eosinophils Relative: 4 %
HCT: 25.3 % — ABNORMAL LOW (ref 39.0–52.0)
HEMOGLOBIN: 8.9 g/dL — AB (ref 13.0–17.0)
LYMPHS PCT: 27 %
Lymphs Abs: 4.3 10*3/uL — ABNORMAL HIGH (ref 0.7–4.0)
MCH: 32.8 pg (ref 26.0–34.0)
MCHC: 35.2 g/dL (ref 30.0–36.0)
MCV: 93.4 fL (ref 78.0–100.0)
Monocytes Absolute: 1.9 10*3/uL — ABNORMAL HIGH (ref 0.1–1.0)
Monocytes Relative: 12 %
NEUTROS PCT: 56 %
Neutro Abs: 8.9 10*3/uL — ABNORMAL HIGH (ref 1.7–7.7)
Platelets: 423 10*3/uL — ABNORMAL HIGH (ref 150–400)
RBC: 2.71 MIL/uL — ABNORMAL LOW (ref 4.22–5.81)
RDW: 17.4 % — ABNORMAL HIGH (ref 11.5–15.5)
WBC: 15.9 10*3/uL — ABNORMAL HIGH (ref 4.0–10.5)

## 2015-01-05 LAB — URINALYSIS, ROUTINE W REFLEX MICROSCOPIC
Bilirubin Urine: NEGATIVE
GLUCOSE, UA: NEGATIVE mg/dL
Hgb urine dipstick: NEGATIVE
KETONES UR: NEGATIVE mg/dL
LEUKOCYTES UA: NEGATIVE
NITRITE: NEGATIVE
PH: 6.5 (ref 5.0–8.0)
Protein, ur: NEGATIVE mg/dL
SPECIFIC GRAVITY, URINE: 1.015 (ref 1.005–1.030)

## 2015-01-05 LAB — COMPREHENSIVE METABOLIC PANEL
ALBUMIN: 5 g/dL (ref 3.5–5.0)
ALK PHOS: 75 U/L (ref 38–126)
ALT: 13 U/L — AB (ref 17–63)
ANION GAP: 7 (ref 5–15)
AST: 25 U/L (ref 15–41)
BUN: 8 mg/dL (ref 6–20)
CALCIUM: 9.5 mg/dL (ref 8.9–10.3)
CHLORIDE: 104 mmol/L (ref 101–111)
CO2: 26 mmol/L (ref 22–32)
CREATININE: 0.53 mg/dL — AB (ref 0.61–1.24)
GFR calc Af Amer: >60 mL/min (ref >60)
GFR calc non Af Amer: >60 mL/min (ref >60)
GLUCOSE: 101 mg/dL — AB (ref 65–99)
Potassium: 3.9 mmol/L (ref 3.5–5.1)
SODIUM: 137 mmol/L (ref 135–145)
Total Bilirubin: 3 mg/dL — ABNORMAL HIGH (ref 0.3–1.2)
Total Protein: 8.4 g/dL — ABNORMAL HIGH (ref 6.5–8.1)

## 2015-01-05 LAB — RETICULOCYTES
RBC.: 2.71 MIL/uL — AB (ref 4.22–5.81)
RETIC CT PCT: 9.3 % — AB (ref 0.4–3.1)
Retic Count, Absolute: 252 10*3/uL — ABNORMAL HIGH (ref 19.0–186.0)

## 2015-01-05 LAB — LACTATE DEHYDROGENASE: LDH: 259 U/L — ABNORMAL HIGH (ref 98–192)

## 2015-01-05 MED ORDER — DIPHENHYDRAMINE HCL 25 MG PO CAPS
25.0000 mg | ORAL_CAPSULE | Freq: Once | ORAL | Status: AC
Start: 2015-01-05 — End: 2015-01-05
  Administered 2015-01-05: 25 mg via ORAL
  Filled 2015-01-05: qty 1

## 2015-01-05 MED ORDER — OXYCODONE-ACETAMINOPHEN 5-325 MG PO TABS: 2.0000 | ORAL_TABLET | ORAL | Status: DC | PRN

## 2015-01-05 MED ORDER — IOHEXOL 300 MG/ML  SOLN
100.0000 mL | Freq: Once | INTRAMUSCULAR | Status: AC | PRN
  Administered 2015-01-05: 100 mL via INTRAVENOUS

## 2015-01-05 MED ORDER — HYDROMORPHONE HCL 2 MG/ML IJ SOLN
2.0000 mg | Freq: Once | INTRAMUSCULAR | Status: AC
  Administered 2015-01-05: 2 mg via INTRAVENOUS
  Filled 2015-01-05: qty 1

## 2015-01-05 MED ORDER — OXYCODONE HCL ER 10 MG PO T12A
10.0000 mg | EXTENDED_RELEASE_TABLET | Freq: Two times a day (BID) | ORAL | Status: DC
  Administered 2015-01-05: 10 mg via ORAL
  Filled 2015-01-05: qty 1

## 2015-01-05 MED ORDER — SODIUM CHLORIDE 0.9 % IV BOLUS (SEPSIS)
1000.0000 mL | Freq: Once | INTRAVENOUS | Status: AC
  Administered 2015-01-05: 1000 mL via INTRAVENOUS

## 2015-01-05 MED ORDER — DIPHENHYDRAMINE HCL 50 MG/ML IJ SOLN
25.0000 mg | Freq: Once | INTRAMUSCULAR | Status: AC
  Administered 2015-01-05: 25 mg via INTRAVENOUS
  Filled 2015-01-05: qty 1

## 2015-01-05 MED ORDER — OXYCODONE-ACETAMINOPHEN 10-325 MG PO TABS: 1.0000 | ORAL_TABLET | ORAL | Status: DC | PRN
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
## 2015-01-05 NOTE — Discharge Instructions (Signed)
Sickle Cell Anemia, Adult Sickle cell anemia is a condition in which red blood cells have an abnormal "sickle" shape. This abnormal shape shortens the cells' life span, which results in a lower than normal concentration of red blood cells in the blood. The sickle shape also causes the cells to clump together and block free blood flow through the blood vessels. As a result, the tissues and organs of the body do not receive enough oxygen. Sickle cell anemia causes organ damage and pain and increases the risk of infection. CAUSES  Sickle cell anemia is a genetic disorder. Those who receive two copies of the gene have the condition, and those who receive one copy have the trait. RISK FACTORS The sickle cell gene is most common in people whose families originated in [place]. Other areas of the globe where sickle cell trait occurs include the Mediterranean, [place] and [place], the [place], and the [place].  SIGNS AND SYMPTOMS  Pain, especially in the extremities, back, chest, or abdomen (common). The pain may start suddenly or may develop following an illness, especially if there is dehydration. Pain can also occur due to overexertion or exposure to extreme temperature changes.  Frequent severe bacterial infections, especially certain types of pneumonia and meningitis.  Pain and swelling in the hands and feet.  Decreased activity.   Loss of appetite.   Change in behavior.  Headaches.  Seizures.  Shortness of breath or difficulty breathing.  Vision changes.  Skin ulcers. Those with the trait may not have symptoms or they may have mild symptoms.  DIAGNOSIS  Sickle cell anemia is diagnosed with blood tests that demonstrate the genetic trait. It is often diagnosed during the newborn period, due to mandatory testing nationwide. A variety of blood tests, X-rays, CT scans, MRI scans, ultrasounds, and lung function tests may also be done to monitor the condition. TREATMENT  Sickle  cell anemia may be treated with:  Medicines. You may be given pain medicines, antibiotic medicines (to treat and prevent infections) or medicines to increase the production of certain types of hemoglobin.  Fluids.  Oxygen.  Blood transfusions. HOME CARE INSTRUCTIONS   Drink enough fluid to keep your urine clear or pale yellow. Increase your fluid intake in hot weather and during exercise.  Do not smoke. Smoking lowers oxygen levels in the blood.   Only take over-the-counter or prescription medicines for pain, fever, or discomfort as directed by your health care provider.  Take antibiotics as directed by your health care provider. Make sure you finish them it even if you start to feel better.   Take supplements as directed by your health care provider.   Consider wearing a medical alert bracelet. This tells anyone caring for you in an emergency of your condition.   When traveling, keep your medical information, health care provider's names, and the medicines you take with you at all times.   If you develop a fever, do not take medicines to reduce the fever right away. This could cover up a problem that is developing. Notify your health care provider.  Keep all follow-up appointments with your health care provider. Sickle cell anemia requires regular medical care. SEEK MEDICAL CARE IF: You have a fever. SEEK IMMEDIATE MEDICAL CARE IF:   You feel dizzy or faint.   You have new abdominal pain, especially on the left side near the stomach area.   You develop a persistent, often uncomfortable and painful penile erection (priapism). If this is not treated immediately it   will lead to impotence.   You have numbness your arms or legs or you have a hard time moving them.   You have a hard time with speech.   You have a fever or persistent symptoms for more than 2-3 days.   You have a fever and your symptoms suddenly get worse.   You have signs or symptoms of infection.  These include:   Chills.   Abnormal tiredness (lethargy).   Irritability.   Poor eating.   Vomiting.   You develop pain that is not helped with medicine.   You develop shortness of breath.  You have pain in your chest.   You are coughing up pus-like or bloody sputum.   You develop a stiff neck.  Your feet or hands swell or have pain.  Your abdomen appears bloated.  You develop joint pain. MAKE SURE YOU:  Understand these instructions.   This information is not intended to replace advice given to you by your health care provider. Make sure you discuss any questions you have with your health care provider.   Document Released: 05/06/2005 Document Revised: 02/16/2014 Document Reviewed: 09/07/2012 Elsevier Interactive Patient Education 2016 Elsevier Inc.  

## 2015-01-07 ENCOUNTER — Encounter (HOSPITAL_COMMUNITY): Payer: Self-pay | Admitting: Emergency Medicine

## 2015-01-07 ENCOUNTER — Emergency Department (HOSPITAL_COMMUNITY)
Admission: EM | Admit: 2015-01-07 | Discharge: 2015-01-07 | Disposition: A | Discharge status: Home / Self Care | Payer: Medicaid - Out of State | Attending: Emergency Medicine | Admitting: Emergency Medicine

## 2015-01-07 DIAGNOSIS — F17210 Nicotine dependence, cigarettes, uncomplicated: Secondary | ICD-10-CM | POA: Insufficient documentation

## 2015-01-07 DIAGNOSIS — D5700 Hb-SS disease with crisis, unspecified: Secondary | ICD-10-CM | POA: Insufficient documentation

## 2015-01-07 DIAGNOSIS — Z79899 Other long term (current) drug therapy: Secondary | ICD-10-CM | POA: Insufficient documentation

## 2015-01-07 LAB — CBC WITH DIFFERENTIAL/PLATELET
BASOS ABS: 0.1 10*3/uL (ref 0.0–0.1)
Basophils Relative: 1 %
EOS ABS: 0.5 10*3/uL (ref 0.0–0.7)
EOS PCT: 4 %
HCT: 25 % — ABNORMAL LOW (ref 39.0–52.0)
HEMOGLOBIN: 8.8 g/dL — AB (ref 13.0–17.0)
Lymphocytes Relative: 23 %
Lymphs Abs: 2.6 10*3/uL (ref 0.7–4.0)
MCH: 32.6 pg (ref 26.0–34.0)
MCHC: 35.2 g/dL (ref 30.0–36.0)
MCV: 92.6 fL (ref 78.0–100.0)
Monocytes Absolute: 1.6 10*3/uL — ABNORMAL HIGH (ref 0.1–1.0)
Monocytes Relative: 14 %
NEUTROS PCT: 58 %
Neutro Abs: 6.6 10*3/uL (ref 1.7–7.7)
PLATELETS: 410 10*3/uL — AB (ref 150–400)
RBC: 2.7 MIL/uL — AB (ref 4.22–5.81)
RDW: 17.6 % — ABNORMAL HIGH (ref 11.5–15.5)
WBC: 11.4 10*3/uL — AB (ref 4.0–10.5)

## 2015-01-07 LAB — RETICULOCYTES
RBC.: 2.7 MIL/uL — AB (ref 4.22–5.81)
RETIC COUNT ABSOLUTE: 194.4 10*3/uL — AB (ref 19.0–186.0)
RETIC CT PCT: 7.2 % — AB (ref 0.4–3.1)

## 2015-01-07 LAB — LIPASE, BLOOD: LIPASE: 21 U/L (ref 11–51)

## 2015-01-07 LAB — COMPREHENSIVE METABOLIC PANEL
ALK PHOS: 64 U/L (ref 38–126)
ALT: 10 U/L — AB (ref 17–63)
ANION GAP: 5 (ref 5–15)
AST: 36 U/L (ref 15–41)
Albumin: 4.4 g/dL (ref 3.5–5.0)
BUN: 6 mg/dL (ref 6–20)
CALCIUM: 9.5 mg/dL (ref 8.9–10.3)
CHLORIDE: 107 mmol/L (ref 101–111)
CO2: 29 mmol/L (ref 22–32)
CREATININE: 0.47 mg/dL — AB (ref 0.61–1.24)
Glucose, Bld: 87 mg/dL (ref 65–99)
Potassium: 4.4 mmol/L (ref 3.5–5.1)
SODIUM: 141 mmol/L (ref 135–145)
Total Bilirubin: 4 mg/dL — ABNORMAL HIGH (ref 0.3–1.2)
Total Protein: 7.9 g/dL (ref 6.5–8.1)

## 2015-01-07 LAB — URINALYSIS, ROUTINE W REFLEX MICROSCOPIC
BILIRUBIN URINE: NEGATIVE
GLUCOSE, UA: NEGATIVE mg/dL
HGB URINE DIPSTICK: NEGATIVE
KETONES UR: NEGATIVE mg/dL
Leukocytes, UA: NEGATIVE
Nitrite: NEGATIVE
PROTEIN: NEGATIVE mg/dL
Specific Gravity, Urine: 1.01 (ref 1.005–1.030)
pH: 7 (ref 5.0–8.0)

## 2015-01-07 LAB — LACTATE DEHYDROGENASE: LDH: 345 U/L — ABNORMAL HIGH (ref 98–192)

## 2015-01-07 MED ORDER — DIPHENHYDRAMINE HCL 50 MG/ML IJ SOLN
12.5000 mg | Freq: Once | INTRAMUSCULAR | Status: AC
  Administered 2015-01-07: 12.5 mg via INTRAVENOUS
  Filled 2015-01-07: qty 1

## 2015-01-07 MED ORDER — ONDANSETRON HCL 4 MG/2ML IJ SOLN
4.0000 mg | Freq: Once | INTRAMUSCULAR | Status: AC
  Administered 2015-01-07: 4 mg via INTRAVENOUS
  Filled 2015-01-07: qty 2

## 2015-01-07 MED ORDER — SODIUM CHLORIDE 0.9 % IV BOLUS (SEPSIS)
500.0000 mL | Freq: Once | INTRAVENOUS | Status: AC
  Administered 2015-01-07: 500 mL via INTRAVENOUS

## 2015-01-07 MED ORDER — HYDROMORPHONE HCL 1 MG/ML IJ SOLN
1.0000 mg | Freq: Once | INTRAMUSCULAR | Status: AC
  Administered 2015-01-07: 1 mg via INTRAVENOUS
  Filled 2015-01-07: qty 1

## 2015-01-07 MED ORDER — HYDROMORPHONE HCL 2 MG/ML IJ SOLN
2.0000 mg | Freq: Once | INTRAMUSCULAR | Status: AC
  Administered 2015-01-07: 2 mg via INTRAVENOUS
  Filled 2015-01-07: qty 1

## 2015-01-07 NOTE — ED Notes (Signed)
Unable to obtain blood or complete IV start. Phlebotomy made aware and order for IV team consult placed in chart.
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
## 2015-01-07 NOTE — ED Notes (Signed)
Patient states he has had back and left abdominal pain x3 days. Reports nausea. Denies vomiting/diarrhea. Hx of sickle cell pain crisis.

## 2015-01-07 NOTE — ED Notes (Signed)
Pt pulled his own IV out and left before dc paperwork. He stated earlier that his brother was coming to pick him up.

## 2015-01-07 NOTE — Discharge Instructions (Signed)
Sickle Cell Anemia, Adult °Sickle cell anemia is a condition in which red blood cells have an abnormal "sickle" shape. This abnormal shape shortens the cells' life span, which results in a lower than normal concentration of red blood cells in the blood. The sickle shape also causes the cells to clump together and block free blood flow through the blood vessels. As a result, the tissues and organs of the body do not receive enough oxygen. Sickle cell anemia causes organ damage and pain and increases the risk of infection. °CAUSES  °Sickle cell anemia is a genetic disorder. Those who receive two copies of the gene have the condition, and those who receive one copy have the trait. °RISK FACTORS °The sickle cell gene is most common in people whose families originated in [place]. Other areas of the globe where sickle cell trait occurs include the Mediterranean, [place] and [place], the [place], and the [place].  °SIGNS AND SYMPTOMS °· Pain, especially in the extremities, back, chest, or abdomen (common). The pain may start suddenly or may develop following an illness, especially if there is dehydration. Pain can also occur due to overexertion or exposure to extreme temperature changes. °· Frequent severe bacterial infections, especially certain types of pneumonia and meningitis. °· Pain and swelling in the hands and feet. °· Decreased activity.   °· Loss of appetite.   °· Change in behavior. °· Headaches. °· Seizures. °· Shortness of breath or difficulty breathing. °· Vision changes. °· Skin ulcers. °Those with the trait may not have symptoms or they may have mild symptoms.  °DIAGNOSIS  °Sickle cell anemia is diagnosed with blood tests that demonstrate the genetic trait. It is often diagnosed during the newborn period, due to mandatory testing nationwide. A variety of blood tests, X-rays, CT scans, MRI scans, ultrasounds, and lung function tests may also be done to monitor the condition. °TREATMENT  °Sickle  cell anemia may be treated with: °· Medicines. You may be given pain medicines, antibiotic medicines (to treat and prevent infections) or medicines to increase the production of certain types of hemoglobin. °· Fluids. °· Oxygen. °· Blood transfusions. °HOME CARE INSTRUCTIONS  °· Drink enough fluid to keep your urine clear or pale yellow. Increase your fluid intake in hot weather and during exercise. °· Do not smoke. Smoking lowers oxygen levels in the blood.   °· Only take over-the-counter or prescription medicines for pain, fever, or discomfort as directed by your health care provider. °· Take antibiotics as directed by your health care provider. Make sure you finish them it even if you start to feel better.   °· Take supplements as directed by your health care provider.   °· Consider wearing a medical alert bracelet. This tells anyone caring for you in an emergency of your condition.   °· When traveling, keep your medical information, health care provider's names, and the medicines you take with you at all times.   °· If you develop a fever, do not take medicines to reduce the fever right away. This could cover up a problem that is developing. Notify your health care provider. °· Keep all follow-up appointments with your health care provider. Sickle cell anemia requires regular medical care. °SEEK MEDICAL CARE IF: ° You have a fever. °SEEK IMMEDIATE MEDICAL CARE IF:  °· You feel dizzy or faint.   °· You have new abdominal pain, especially on the left side near the stomach area.   °· You develop a persistent, often uncomfortable and painful penile erection (priapism). If this is not treated immediately it   will lead to impotence.   You have numbness your arms or legs or you have a hard time moving them.   You have a hard time with speech.   You have a fever or persistent symptoms for more than 2-3 days.   You have a fever and your symptoms suddenly get worse.   You have signs or symptoms of infection.  These include:   Chills.   Abnormal tiredness (lethargy).   Irritability.   Poor eating.   Vomiting.   You develop pain that is not helped with medicine.   You develop shortness of breath.  You have pain in your chest.   You are coughing up pus-like or bloody sputum.   You develop a stiff neck.  Your feet or hands swell or have pain.  Your abdomen appears bloated.  You develop joint pain. MAKE SURE YOU:  Understand these instructions.   This information is not intended to replace advice given to you by your health care provider. Make sure you discuss any questions you have with your health care provider.   Document Released: 05/06/2005 Document Revised: 02/16/2014 Document Reviewed: 09/07/2012 Elsevier Interactive Patient Education 2016 Elsevier Inc[employer] Pain Without a Known Cause WHAT IS PAIN WITHOUT A KNOWN CAUSE? Pain can occur in any part of the body and can range from mild to severe. Sometimes no cause can be found for why you are having pain. Some types of pain that can occur without a known cause include:   Headache.  Back pain.  Abdominal pain.  Neck pain. HOW IS PAIN WITHOUT A KNOWN CAUSE DIAGNOSED?  Your health care provider will try to find the cause of your pain. This may include:  Physical exam.  Medical history.  Blood tests.  Urine tests.  X-rays. If no cause is found, your health care provider may diagnose you with pain without a known cause.  IS THERE TREATMENT FOR PAIN WITHOUT A CAUSE?  Treatment depends on the kind of pain you have. Your health care provider may prescribe medicines to help relieve your pain.  WHAT CAN I DO AT HOME FOR MY PAIN?   Take medicines only as directed by your health care provider.  Stop any activities that cause pain. During periods of severe pain, bed rest may help.  Try to reduce your stress with activities such as yoga or meditation. Talk to your health care provider for other stress-reducing  activity recommendations.  Exercise regularly, if approved by your health care provider.  Eat a healthy diet that includes fruits and vegetables. This may improve pain. Talk to your health care provider if you have any questions about your diet. WHAT IF MY PAIN DOES NOT GET BETTER?  If you have a painful condition and no reason can be found for the pain or the pain gets worse, it is important to follow up with your health care provider. It may be necessary to repeat tests and look further for a possible cause.    This information is not intended to replace advice given to you by your health care provider. Make sure you discuss any questions you have with your health care provider.   Document Released: 10/21/2000 Document Revised: 02/16/2014 Document Reviewed: 06/13/2013 Elsevier Interactive Patient Education [employer]

## 2015-01-07 NOTE — ED Notes (Signed)
MD at bedside. 

## 2015-01-07 NOTE — ED Notes (Addendum)
Medicated.   Aware of need for urine specimen. Snacks and Sprite provided.

## 2015-01-08 ENCOUNTER — Emergency Department (HOSPITAL_COMMUNITY)
Admission: EM | Admit: 2015-01-08 | Discharge: 2015-01-09 | Discharge status: Against Medical Advice | Payer: Medicaid - Out of State

## 2015-01-08 NOTE — ED Notes (Signed)
No answer when pt's name called for triage  

## 2015-01-09 ENCOUNTER — Encounter (HOSPITAL_COMMUNITY): Payer: Self-pay | Admitting: Emergency Medicine

## 2015-01-09 ENCOUNTER — Non-Acute Institutional Stay (HOSPITAL_COMMUNITY)
Admission: AD | Admit: 2015-01-09 | Discharge: 2015-01-09 | Disposition: A | Discharge status: Home / Self Care | Payer: Medicaid - Out of State | Attending: Internal Medicine | Admitting: Internal Medicine

## 2015-01-09 ENCOUNTER — Emergency Department (HOSPITAL_COMMUNITY)
Admission: EM | Admit: 2015-01-09 | Discharge: 2015-01-09 | Discharge status: Against Medical Advice | Payer: Medicaid - Out of State | Source: Home / Self Care | Attending: Emergency Medicine | Admitting: Emergency Medicine

## 2015-01-09 ENCOUNTER — Encounter (HOSPITAL_COMMUNITY): Payer: Self-pay

## 2015-01-09 ENCOUNTER — Telehealth (HOSPITAL_COMMUNITY): Payer: Self-pay | Admitting: *Deleted

## 2015-01-09 DIAGNOSIS — D5700 Hb-SS disease with crisis, unspecified: Secondary | ICD-10-CM | POA: Insufficient documentation

## 2015-01-09 DIAGNOSIS — R110 Nausea: Secondary | ICD-10-CM

## 2015-01-09 DIAGNOSIS — Z79899 Other long term (current) drug therapy: Secondary | ICD-10-CM | POA: Insufficient documentation

## 2015-01-09 DIAGNOSIS — M545 Low back pain: Secondary | ICD-10-CM | POA: Diagnosis present

## 2015-01-09 DIAGNOSIS — Z79891 Long term (current) use of opiate analgesic: Secondary | ICD-10-CM | POA: Diagnosis not present

## 2015-01-09 DIAGNOSIS — F17210 Nicotine dependence, cigarettes, uncomplicated: Secondary | ICD-10-CM

## 2015-01-09 MED ORDER — ONDANSETRON HCL 4 MG/2ML IJ SOLN
4.0000 mg | Freq: Once | INTRAMUSCULAR | Status: AC
  Administered 2015-01-09: 4 mg via INTRAVENOUS
  Filled 2015-01-09: qty 2

## 2015-01-09 MED ORDER — ONDANSETRON HCL 4 MG/2ML IJ SOLN: 4.0000 mg | Freq: Four times a day (QID) | INTRAMUSCULAR | Status: DC | PRN

## 2015-01-09 MED ORDER — SODIUM CHLORIDE 0.9 % IJ SOLN: 9.0000 mL | INTRAMUSCULAR | Status: DC | PRN

## 2015-01-09 MED ORDER — HYDROMORPHONE 1 MG/ML IV SOLN
INTRAVENOUS | Status: DC
  Administered 2015-01-09: 8.4 mg via INTRAVENOUS
  Administered 2015-01-09: 11:00:00 via INTRAVENOUS
  Filled 2015-01-09: qty 25

## 2015-01-09 MED ORDER — NALOXONE HCL 0.4 MG/ML IJ SOLN: 0.4000 mg | INTRAMUSCULAR | Status: DC | PRN

## 2015-01-09 MED ORDER — HYDROMORPHONE HCL 1 MG/ML IJ SOLN
1.0000 mg | Freq: Once | INTRAMUSCULAR | Status: AC
  Administered 2015-01-09: 1 mg via INTRAVENOUS
  Filled 2015-01-09: qty 1

## 2015-01-09 MED ORDER — DIPHENHYDRAMINE HCL 50 MG/ML IJ SOLN
25.0000 mg | Freq: Once | INTRAMUSCULAR | Status: AC
  Administered 2015-01-09: 25 mg via INTRAVENOUS
  Filled 2015-01-09: qty 1

## 2015-01-09 MED ORDER — DEXTROSE-NACL 5-0.45 % IV SOLN
INTRAVENOUS | Status: DC
  Administered 2015-01-09: 11:00:00 via INTRAVENOUS

## 2015-01-09 MED ORDER — SODIUM CHLORIDE 0.9 % IV SOLN
12.5000 mg | Freq: Four times a day (QID) | INTRAVENOUS | Status: DC | PRN
  Filled 2015-01-09: qty 0.25

## 2015-01-09 MED ORDER — DIPHENHYDRAMINE HCL 12.5 MG/5ML PO ELIX: 12.5000 mg | ORAL_SOLUTION | Freq: Four times a day (QID) | ORAL | Status: DC | PRN

## 2015-01-09 MED ORDER — KETOROLAC TROMETHAMINE 30 MG/ML IJ SOLN
30.0000 mg | Freq: Once | INTRAMUSCULAR | Status: AC
  Administered 2015-01-09: 30 mg via INTRAVENOUS
  Filled 2015-01-09: qty 1

## 2015-01-09 NOTE — Telephone Encounter (Signed)
Pt called, requesting [facility]. States his last pain med was 2 days ago. States pain is in his back and legs. Will check with the provider C.Hart RochesterHollis, NP and call him back. Pt voiced understanding.
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
## 2015-01-09 NOTE — Telephone Encounter (Signed)
Pt called back and told that he can come to the Day hospital after speaking with the provider. States he can get here in .

## 2015-01-09 NOTE — ED Notes (Signed)
Per EMS: Pt from home.  Pain in legs and back d/t SCC.  No NVD. No SOB.  No CP.

## 2015-01-09 NOTE — ED Notes (Signed)
No answer when pt called for triage. Multiple staff has tried to call and look for him with no response.
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
## 2015-01-09 NOTE — Discharge Instructions (Signed)
Sickle Cell Anemia, Adult Sickle cell anemia is a condition in which red blood cells have an abnormal "sickle" shape. This abnormal shape shortens the cells' life span, which results in a lower than normal concentration of red blood cells in the blood. The sickle shape also causes the cells to clump together and block free blood flow through the blood vessels. As a result, the tissues and organs of the body do not receive enough oxygen. Sickle cell anemia causes organ damage and pain and increases the risk of infection. CAUSES  Sickle cell anemia is a genetic disorder. Those who receive two copies of the gene have the condition, and those who receive one copy have the trait. RISK FACTORS The sickle cell gene is most common in people whose families originated in [place]. Other areas of the globe where sickle cell trait occurs include the Mediterranean, [place] and [place], the [place], and the [place].  SIGNS AND SYMPTOMS  Pain, especially in the extremities, back, chest, or abdomen (common). The pain may start suddenly or may develop following an illness, especially if there is dehydration. Pain can also occur due to overexertion or exposure to extreme temperature changes.  Frequent severe bacterial infections, especially certain types of pneumonia and meningitis.  Pain and swelling in the hands and feet.  Decreased activity.   Loss of appetite.   Change in behavior.  Headaches.  Seizures.  Shortness of breath or difficulty breathing.  Vision changes.  Skin ulcers. Those with the trait may not have symptoms or they may have mild symptoms.  DIAGNOSIS  Sickle cell anemia is diagnosed with blood tests that demonstrate the genetic trait. It is often diagnosed during the newborn period, due to mandatory testing nationwide. A variety of blood tests, X-rays, CT scans, MRI scans, ultrasounds, and lung function tests may also be done to monitor the condition. TREATMENT  Sickle  cell anemia may be treated with:  Medicines. You may be given pain medicines, antibiotic medicines (to treat and prevent infections) or medicines to increase the production of certain types of hemoglobin.  Fluids.  Oxygen.  Blood transfusions. HOME CARE INSTRUCTIONS   Drink enough fluid to keep your urine clear or pale yellow. Increase your fluid intake in hot weather and during exercise.  Do not smoke. Smoking lowers oxygen levels in the blood.   Only take over-the-counter or prescription medicines for pain, fever, or discomfort as directed by your health care provider.  Take antibiotics as directed by your health care provider. Make sure you finish them it even if you start to feel better.   Take supplements as directed by your health care provider.   Consider wearing a medical alert bracelet. This tells anyone caring for you in an emergency of your condition.   When traveling, keep your medical information, health care provider's names, and the medicines you take with you at all times.   If you develop a fever, do not take medicines to reduce the fever right away. This could cover up a problem that is developing. Notify your health care provider.  Keep all follow-up appointments with your health care provider. Sickle cell anemia requires regular medical care. SEEK MEDICAL CARE IF: You have a fever. SEEK IMMEDIATE MEDICAL CARE IF:   You feel dizzy or faint.   You have new abdominal pain, especially on the left side near the stomach area.   You develop a persistent, often uncomfortable and painful penile erection (priapism). If this is not treated immediately it   will lead to impotence.   You have numbness your arms or legs or you have a hard time moving them.   You have a hard time with speech.   You have a fever or persistent symptoms for more than 2-3 days.   You have a fever and your symptoms suddenly get worse.   You have signs or symptoms of infection.  These include:   Chills.   Abnormal tiredness (lethargy).   Irritability.   Poor eating.   Vomiting.   You develop pain that is not helped with medicine.   You develop shortness of breath.  You have pain in your chest.   You are coughing up pus-like or bloody sputum.   You develop a stiff neck.  Your feet or hands swell or have pain.  Your abdomen appears bloated.  You develop joint pain. MAKE SURE YOU:  Understand these instructions.   This information is not intended to replace advice given to you by your health care provider. Make sure you discuss any questions you have with your health care provider.   Document Released: 05/06/2005 Document Revised: 02/16/2014 Document Reviewed: 09/07/2012 Elsevier Interactive Patient Education 2016 Elsevier Inc.  

## 2015-01-09 NOTE — ED Notes (Signed)
Pt left without informing the RN or allowing for discharge vital signs. Pt was not up for discharge. Pt removed his own IV prior to leaving AMA

## 2015-01-09 NOTE — Discharge Summary (Signed)
[facility] Discharge Summary   Patient ID: Matthew HarrisonBobby Shannon MRN: [id number] DOB/AGE: 26/04/1990 26 y.o.  Admit date: 01/09/2015 Discharge date: 01/09/2015  Primary Care Physician:  Matthew LewandowskyJEGEDE, OLUGBEMIGA, MD  Admission Diagnoses:  Active Problems:   Sickle cell anemia with pain [facility](HCC)  Discharge Medications:    Medication List    ASK your doctor about these medications        folic acid 1 MG tablet  Commonly known as:  FOLVITE  Take 1 tablet (1 mg total) by mouth daily.     hydroxyurea 500 MG capsule  Commonly known as:  HYDREA  Take 2 capsules (1,000 mg total) by mouth daily. May take with food to minimize GI side effects.     ibuprofen 600 MG tablet  Commonly known as:  ADVIL,MOTRIN  Take 1 tablet (600 mg total) by mouth every 8 (eight) hours as needed.     morphine 30 MG 12 hr tablet  Commonly known as:  MS CONTIN  Take 1 tablet (30 mg total) by mouth every 12 (twelve) hours.     nicotine 14 mg/24hr patch  Commonly known as:  NICODERM CQ  Place 1 patch (14 mg total) onto the skin daily.     ondansetron 8 MG tablet  Commonly known as:  ZOFRAN  Take 1 tablet (8 mg total) by mouth every 8 (eight) hours as needed for nausea or vomiting.     oxyCODONE 15 MG immediate release tablet  Commonly known as:  ROXICODONE  Take 1 tablet (15 mg total) by mouth every 4 (four) hours as needed.     oxyCODONE-acetaminophen 5-325 MG tablet  Commonly known as:  PERCOCET/ROXICET  Take 2 tablets by mouth every 4 (four) hours as needed for severe pain.         Consults:  None  Significant Diagnostic Studies:  Dg Chest 2 View  01/05/2015  CLINICAL DATA:  Acute onset of generalized back and abdominal pain. Current history of sickle cell disease. Initial encounter. EXAM: CHEST  2 VIEW COMPARISON:  Chest radiograph performed 12/19/2014 FINDINGS: The lungs are well-aerated. Minimal right-sided airspace opacities may reflect acute chest syndrome, given the patient's  symptoms. There is no evidence of pleural effusion or pneumothorax. The heart is normal in size; the mediastinal contour is within normal limits. No acute osseous abnormalities are seen. Clips are noted within the right upper quadrant, reflecting prior cholecystectomy. IMPRESSION: Minimal right-sided airspace opacities may reflect acute chest syndrome, given the patient's symptoms. Electronically Signed   By: Matthew RaiderJeffery  Shannon M.D.   On: 01/05/2015 03:44   Dg Chest 2 View  12/19/2014  CLINICAL DATA:  Sickle cell pain crisis this evening. Chest and back pain. EXAM: CHEST  2 VIEW COMPARISON:  12/16/2014 FINDINGS: The cardiac silhouette, mediastinal and hilar contours are within normal limits and stable. The lungs are clear. No pleural effusion. The bony thorax is intact. Stable thoracic scoliosis. IMPRESSION: No acute cardiopulmonary findings. Electronically Signed   By: Matthew MeyerP.  Gallerani M.D.   On: 12/19/2014 23:44   Dg Chest 2 View  12/16/2014  CLINICAL DATA:  Upper back pain today.  No known injury.  Smoker. EXAM: CHEST  2 VIEW COMPARISON:  11/27/2014. FINDINGS: Interval borderline enlargement of the cardiac silhouette. Clear lungs. Mild diffuse peribronchial thickening. Stable mild to moderate scoliosis. Cholecystectomy clips. IMPRESSION: 1. No acute abnormality. 2. Mild chronic bronchitic changes with minimal progression. 3. Stable scoliosis. Electronically Signed   By: Beckie SaltsSteven  Shannon M.D.   On: 12/16/2014  16:58   Ct Abdomen Pelvis W Contrast  01/05/2015  CLINICAL DATA:  Acute onset of mild back pain and left-sided abdominal pain. Leukocytosis. Current history of sickle cell disease. Initial encounter. EXAM: CT ABDOMEN AND PELVIS WITH CONTRAST TECHNIQUE: Multidetector CT imaging of the abdomen and pelvis was performed using the standard protocol following bolus administration of intravenous contrast. CONTRAST:  OMNIPAQUE IOHEXOL 300 MG/ML  SOLN COMPARISON:  None. FINDINGS: Minimal bibasilar atelectasis is  noted. The liver is unremarkable. The spleen is diminutive in appearance. The patient is status post cholecystectomy, with clips noted at the gallbladder fossa. The pancreas and adrenal glands are unremarkable. The kidneys are unremarkable in appearance. There is no evidence of hydronephrosis. No renal or ureteral stones are seen. No perinephric stranding is appreciated. No free fluid is identified. The small bowel is unremarkable in appearance. The stomach is within normal limits. No acute vascular abnormalities are seen. The appendix is normal in caliber, without evidence of appendicitis. The colon is partially filled with stool, and is unremarkable in appearance. The bladder is moderately distended and grossly unremarkable. The prostate remains normal in size. No inguinal lymphadenopathy is seen. No acute osseous abnormalities are identified. H-shaped vertebral bodies reflects the patient's sickle cell disease. IMPRESSION: No acute abnormality seen within the abdomen or pelvis. Electronically Signed   By: Matthew Shannon M.D.   On: 01/05/2015 04:45     [facility] Course: Patient was admitted to the day infusion center. Reviewed labs, consistent with previous values. Patient states that has been taking more medication than prescribed. Matthew Shannon and I discussed medications at length. Reviewed [place] [employer] reporting system, inconsistencies noted over the past month. Patient has an appointment with [name] on Friday, January 11, 2015 to discuss medication management. Patient was started on high concentration PCA dilaudid per weight based protocol. He used a total of 8.4 mg with 16 demands and 14 deliveries. Patient reports that his pain intensity has decreased to 4/10. Recommend that patient increase fluid intake to 64 ounces every other hour.    Physical Exam at Discharge:  BP 117/62 mmHg  Pulse 85  Temp(Src) 98.8 F (37.1 C) (Oral)  Resp 9  Wt 135 lb (61.236 kg)  SpO2  100%   Disposition at Discharge: Discharged in stable condition  Discharge Orders:   Condition at Discharge:   Stable  Time spent on Discharge:    Signed: Milinda Sweeney M 01/09/2015, 1:54 PM

## 2015-01-09 NOTE — Progress Notes (Signed)
[facility] his sickle cell crises. Pt stated pain was 6 on admission to the clinic and progressed to 4 with a Dilaudid PCA, Toradol and IV fluids. With pain in his lower back and legs At discharge pt voiced understanding of discharge instructions. Discharged to home.

## 2015-01-10 ENCOUNTER — Emergency Department (HOSPITAL_COMMUNITY)
Admission: EM | Admit: 2015-01-10 | Discharge: 2015-01-10 | Disposition: A | Discharge status: Home / Self Care | Payer: Medicaid - Out of State | Attending: Emergency Medicine | Admitting: Emergency Medicine

## 2015-01-10 ENCOUNTER — Encounter (HOSPITAL_COMMUNITY): Payer: Self-pay | Admitting: Emergency Medicine

## 2015-01-10 DIAGNOSIS — G8929 Other chronic pain: Secondary | ICD-10-CM | POA: Insufficient documentation

## 2015-01-10 DIAGNOSIS — Z79899 Other long term (current) drug therapy: Secondary | ICD-10-CM | POA: Insufficient documentation

## 2015-01-10 DIAGNOSIS — F17210 Nicotine dependence, cigarettes, uncomplicated: Secondary | ICD-10-CM | POA: Insufficient documentation

## 2015-01-10 DIAGNOSIS — D5700 Hb-SS disease with crisis, unspecified: Secondary | ICD-10-CM | POA: Diagnosis not present

## 2015-01-10 DIAGNOSIS — M549 Dorsalgia, unspecified: Secondary | ICD-10-CM | POA: Diagnosis present

## 2015-01-10 MED ORDER — HYDROMORPHONE HCL 2 MG PO TABS
2.0000 mg | ORAL_TABLET | Freq: Once | ORAL | Status: AC
  Administered 2015-01-10: 2 mg via ORAL
  Filled 2015-01-10: qty 1
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
## 2015-01-10 NOTE — Discharge Instructions (Signed)
Sickle Cell Anemia, Adult Sickle cell anemia is a condition in which red blood cells have an abnormal "sickle" shape. This abnormal shape shortens the cells' life span, which results in a lower than normal concentration of red blood cells in the blood. The sickle shape also causes the cells to clump together and block free blood flow through the blood vessels. As a result, the tissues and organs of the body do not receive enough oxygen. Sickle cell anemia causes organ damage and pain and increases the risk of infection. CAUSES  Sickle cell anemia is a genetic disorder. Those who receive two copies of the gene have the condition, and those who receive one copy have the trait. RISK FACTORS The sickle cell gene is most common in people whose families originated in [place]. Other areas of the globe where sickle cell trait occurs include the Mediterranean, [place] and [place], the [place], and the [place].  SIGNS AND SYMPTOMS  Pain, especially in the extremities, back, chest, or abdomen (common). The pain may start suddenly or may develop following an illness, especially if there is dehydration. Pain can also occur due to overexertion or exposure to extreme temperature changes.  Frequent severe bacterial infections, especially certain types of pneumonia and meningitis.  Pain and swelling in the hands and feet.  Decreased activity.   Loss of appetite.   Change in behavior.  Headaches.  Seizures.  Shortness of breath or difficulty breathing.  Vision changes.  Skin ulcers. Those with the trait may not have symptoms or they may have mild symptoms.  DIAGNOSIS  Sickle cell anemia is diagnosed with blood tests that demonstrate the genetic trait. It is often diagnosed during the newborn period, due to mandatory testing nationwide. A variety of blood tests, X-rays, CT scans, MRI scans, ultrasounds, and lung function tests may also be done to monitor the condition. TREATMENT  Sickle  cell anemia may be treated with:  Medicines. You may be given pain medicines, antibiotic medicines (to treat and prevent infections) or medicines to increase the production of certain types of hemoglobin.  Fluids.  Oxygen.  Blood transfusions. HOME CARE INSTRUCTIONS   Drink enough fluid to keep your urine clear or pale yellow. Increase your fluid intake in hot weather and during exercise.  Do not smoke. Smoking lowers oxygen levels in the blood.   Only take over-the-counter or prescription medicines for pain, fever, or discomfort as directed by your health care provider.  Take antibiotics as directed by your health care provider. Make sure you finish them it even if you start to feel better.   Take supplements as directed by your health care provider.   Consider wearing a medical alert bracelet. This tells anyone caring for you in an emergency of your condition.   When traveling, keep your medical information, health care provider's names, and the medicines you take with you at all times.   If you develop a fever, do not take medicines to reduce the fever right away. This could cover up a problem that is developing. Notify your health care provider.  Keep all follow-up appointments with your health care provider. Sickle cell anemia requires regular medical care. SEEK MEDICAL CARE IF: You have a fever. SEEK IMMEDIATE MEDICAL CARE IF:   You feel dizzy or faint.   You have new abdominal pain, especially on the left side near the stomach area.   You develop a persistent, often uncomfortable and painful penile erection (priapism). If this is not treated immediately it   will lead to impotence.   You have numbness your arms or legs or you have a hard time moving them.   You have a hard time with speech.   You have a fever or persistent symptoms for more than 2-3 days.   You have a fever and your symptoms suddenly get worse.   You have signs or symptoms of infection.  These include:   Chills.   Abnormal tiredness (lethargy).   Irritability.   Poor eating.   Vomiting.   You develop pain that is not helped with medicine.   You develop shortness of breath.  You have pain in your chest.   You are coughing up pus-like or bloody sputum.   You develop a stiff neck.  Your feet or hands swell or have pain.  Your abdomen appears bloated.  You develop joint pain. MAKE SURE YOU:  Understand these instructions.   This information is not intended to replace advice given to you by your health care provider. Make sure you discuss any questions you have with your health care provider.   Document Released: 05/06/2005 Document Revised: 02/16/2014 Document Reviewed: 09/07/2012 Elsevier Interactive Patient Education 2016 Elsevier Inc.  

## 2015-01-10 NOTE — ED Notes (Signed)
Pt ambulatory to triage with no assistance.  Noted to be in no distress.  Pt texting on phone during triage.  C/o back and lt sided abd pain x 4 days.  No NVD.  No SOB.

## 2015-01-11 ENCOUNTER — Ambulatory Visit (INDEPENDENT_AMBULATORY_CARE_PROVIDER_SITE_OTHER): Payer: Medicaid - Out of State | Admitting: Family Medicine

## 2015-01-11 ENCOUNTER — Encounter: Payer: Self-pay | Admitting: Family Medicine

## 2015-01-11 VITALS — BP 112/62 | HR 85 | Temp 98.3°F | Resp 16 | Ht 70.0 in | Wt 125.0 lb

## 2015-01-11 DIAGNOSIS — Z720 Tobacco use: Secondary | ICD-10-CM

## 2015-01-11 DIAGNOSIS — G8929 Other chronic pain: Secondary | ICD-10-CM | POA: Diagnosis not present

## 2015-01-11 DIAGNOSIS — D5700 Hb-SS disease with crisis, unspecified: Secondary | ICD-10-CM

## 2015-01-11 LAB — POCT URINALYSIS DIP (DEVICE)
Bilirubin Urine: NEGATIVE
GLUCOSE, UA: NEGATIVE mg/dL
Ketones, ur: NEGATIVE mg/dL
LEUKOCYTES UA: NEGATIVE
NITRITE: NEGATIVE
Protein, ur: NEGATIVE mg/dL
Specific Gravity, Urine: 1.015 (ref 1.005–1.030)
UROBILINOGEN UA: 1 mg/dL (ref 0.0–1.0)
pH: 7 (ref 5.0–8.0)

## 2015-01-11 MED ORDER — OXYCODONE HCL 15 MG PO TABS: 15.0000 mg | ORAL_TABLET | ORAL | Status: DC | PRN

## 2015-01-11 NOTE — Progress Notes (Signed)
Subjective:    Patient ID: Matthew Shannon, male    DOB: 09-07-1990, 24 y.o.   MRN: [id number]  HPI [name], a 24 year old male with a history of sickle cell anemia presents for an evaluation of sickle cell anemia, HbSS. Mr. Justiniano has had frequent emergency room visits and admissions over the past several months due to chronic pain. Patient relocated to area from [place], [place]. Patient was evaluated in the emergency department on 01/10/2015. He maintains that he is out of pain medications. Patient was given a prescription for Oxycodone 15 mg every 4 hours for moderate to severe pain. He states that he has been taking more medication than prescribed due to increased pain.   Pain is primarily in lower back and lower extremities. Current pain intensity is 5/10. He states that pain is worsened by activity. Patient last had pain medication on yesterday per IV in the emergency department.   Past Medical History  Diagnosis Date  . Sickle cell anemia (HCC)    Immunization History  Administered Date(s) Administered  . Influenza,inj,Quad PF,36+ Mos 12/17/2014  . Pneumococcal Polysaccharide-23 12/17/2014  No Known Allergies  Social History   Social History  . Marital Status: Single    Spouse Name: N/A  . Number of Children: N/A  . Years of Education: N/A   Occupational History  . Not on file.   Social History Main Topics  . Smoking status: Current Every Day Smoker -- 0.25 packs/day for 0 years    Types: Cigarettes  . Smokeless tobacco: Never Used  . Alcohol Use: No  . Drug Use: No  . Sexual Activity: Not on file   Other Topics Concern  . Not on file   Social History Narrative   Review of Systems  Constitutional: Negative for fever and fatigue.  HENT: Negative.   Eyes: Negative.   Respiratory: Negative.   Cardiovascular: Negative.   Gastrointestinal: Negative.   Endocrine: Negative.   Genitourinary: Negative.   Musculoskeletal: Positive for myalgias and back pain.  Skin:  Negative.   Allergic/Immunologic: Negative.   Neurological: Negative.   Hematological: Negative.   Psychiatric/Behavioral: Negative.  Negative for suicidal ideas and sleep disturbance.      Objective:   Physical Exam  Constitutional: He is oriented to person, place, and time. He appears well-developed and well-nourished.  HENT:  Head: Normocephalic and atraumatic.  Right Ear: External ear normal.  Left Ear: External ear normal.  Mouth/Throat: Oropharynx is clear and moist.  Eyes: Conjunctivae and EOM are normal. Pupils are equal, round, and reactive to light.  Neck: Normal range of motion. Neck supple.  Cardiovascular: Normal rate, regular rhythm and intact distal pulses.   Murmur heard. Abdominal: Soft. Bowel sounds are normal.  Musculoskeletal: Normal range of motion.  Neurological: He is alert and oriented to person, place, and time. He has normal reflexes.  Skin: Skin is warm and dry.  Psychiatric: He has a normal mood and affect. His behavior is normal. Judgment and thought content normal.         BP 112/62 mmHg  Pulse 85  Temp(Src) 98.3 F (36.8 C) (Oral)  Resp 16  Ht  (1.778 m)  Wt 125 lb (56.7 kg)  BMI 17.94 kg/m2 Assessment & Plan:  1. Sickle cell pain crisis (HCC)  Will increase hydrea to 1000 mg daily.  Reviewed labs, ANC >2, hemoglobin 8.8, and platelet count is 410. Will re-check in 4 weeks.  We discussed the need for good hydration,  monitoring of hydration status, avoidance of heat, cold, stress, and infection triggers. We discussed the risks and benefits of Hydrea, including bone marrow suppression, the possibility of GI upset, skin ulcers, hair thinning, and teratogenicity. The patient was reminded of the need to seek medical attention of any symptoms of bleeding, anemia, or infection. Continue folic acid 1 mg daily to prevent aplastic bone marrow crises.   Pulmonary evaluation - Patient denies severe recurrent wheezes, shortness of breath with  exercise, or persistent cough. If these symptoms develop, pulmonary function tests with spirometry will be ordered, and if abnormal, plan on referral to Pulmonology for further evaluation. Reminded [name] of the dangers of smoking and sickle cell anemia  Cardiac - Routine screening for pulmonary hypertension is not recommended.    Acute and chronic painful episodes - We will re-start opiate medications. Patient will receive Oxycodone 15 mg every 4 hours as needed # 90. Patient is to return to office in 2 weeks for a mediations count. Advised patient to uses sickle cell day hospital for acute pain episodes. Reminded [name] of hours of operation.    We discussed that pt is to receive hisSchedule II prescriptions only from us. Pt is also aware that the prescription history is available to us online through the [facility] CSRS. Controlled substance agreement signed 01/11/2015. We reminded [name] that all patients receiving Schedule II narcotics must be seen for follow within one month of prescription being requested. We reviewed the terms of our pain agreement, including the need to keep medicines in a safe locked location away from children or pets, and the need to report excess sedation or constipation, measures to avoid constipation, and policies related to early refills and stolen prescriptions. According to the [place] [facility] program, we have reviewed details related to analgesia, adverse effects, aberrant behaviors. - POCT urinalysis dipstick - Prescription Monitoring Profile (17)-[facility]   2. Chronic pain  Reviewed [place] Substance Reporting system prior to reorder, multiple inconsistencies noted. Discussed medication management with [name] at length. He is scheduled to return to office for medication management in 2 weeks.  - oxyCODONE (ROXICODONE) 15 MG immediate release tablet; Take 1 tablet (15 mg total) by mouth every 4 (four) hours as needed.  Dispense: 90 tablet; Refill: 0 -  POCT urinalysis dipstick - Prescription Monitoring Profile (17)-[facility]  3. Tobacco abuse Smoking cessation instruction/counseling given:  counseled patient on the dangers of tobacco use, advised patient to stop smoking, and reviewed strategies to maximize success   RTC: 2 weeks for medication management  Massie MaroonHollis,Dasan Hardman M, FNP

## 2015-01-11 NOTE — Patient Instructions (Addendum)
BRING PILL BOTTLES TO APPOINTMENT FOR PILL COUNT IN 2 WEEKS Chronic Pain Chronic pain can be defined as pain that is off and on and lasts for 3-6 months or longer. Many things cause chronic pain, which can make it difficult to make a diagnosis. There are many treatment options available for chronic pain. However, finding a treatment that works well for you may require trying various approaches until the right one is found. Many people benefit from a combination of two or more types of treatment to control their pain. SYMPTOMS  Chronic pain can occur anywhere in the body and can range from mild to very severe. Some types of chronic pain include:  Headache.  Low back pain.  Cancer pain.  Arthritis pain.  Neurogenic pain. This is pain resulting from damage to nerves. People with chronic pain may also have other symptoms such as:  Depression.  Anger.  Insomnia.  Anxiety. DIAGNOSIS  Your health care provider will help diagnose your condition over time. In many cases, the initial focus will be on excluding possible conditions that could be causing the pain. Depending on your symptoms, your health care provider may order tests to diagnose your condition. Some of these tests may include:   Blood tests.   CT scan.   MRI.   X-rays.   Ultrasounds.   Nerve conduction studies.  You may need to see a specialist.  TREATMENT  Finding treatment that works well may take time. You may be referred to a pain specialist. He or she may prescribe medicine or therapies, such as:   Mindful meditation or yoga.  Shots (injections) of numbing or pain-relieving medicines into the spine or area of pain.  Local electrical stimulation.  Acupuncture.   Massage therapy.   Aroma, color, light, or sound therapy.   Biofeedback.   Working with a physical therapist to keep from getting stiff.   Regular, gentle exercise.   Cognitive or behavioral therapy.   Group support.   Sometimes, surgery may be recommended.  HOME CARE INSTRUCTIONS   Take all medicines as directed by your health care provider.   Lessen stress in your life by relaxing and doing things such as listening to calming music.   Exercise or be active as directed by your health care provider.   Eat a healthy diet and include things such as vegetables, fruits, fish, and lean meats in your diet.   Keep all follow-up appointments with your health care provider.   Attend a support group with others suffering from chronic pain. SEEK MEDICAL CARE IF:   Your pain gets worse.   You develop a new pain that was not there before.   You cannot tolerate medicines given to you by your health care provider.   You have new symptoms since your last visit with your health care provider.  SEEK IMMEDIATE MEDICAL CARE IF:   You feel weak.   You have decreased sensation or numbness.   You lose control of bowel or bladder function.   Your pain suddenly gets much worse.   You develop shaking.  You develop chills.  You develop confusion.  You develop chest pain.  You develop shortness of breath.  MAKE SURE YOU:  Understand these instructions.  Will watch your condition.  Will get help right away if you are not doing well or get worse.   This information is not intended to replace advice given to you by your health care provider. Make sure you discuss any questions you  have with your health care provider.   Document Released: 10/18/2001 Document Revised: 09/28/2012 Document Reviewed: 07/22/2012 Elsevier Interactive Patient Education [employer]2016 Elsevier Inc.

## 2015-01-13 LAB — OPIATES/OPIOIDS (LC/MS-MS)
Codeine Urine: NEGATIVE ng/mL (ref <50)
Hydrocodone: NEGATIVE ng/mL (ref <50)
Hydromorphone: 448 ng/mL — AB (ref <50)
MORPHINE: NEGATIVE ng/mL (ref <50)
NORHYDROCODONE, UR: NEGATIVE ng/mL (ref <50)
NOROXYCODONE, UR: 4614 ng/mL — AB (ref <50)
OXYCODONE, UR: 212 ng/mL — AB (ref <50)
OXYMORPHONE, URINE: 2084 ng/mL — AB (ref <50)

## 2015-01-13 LAB — OXYCODONE, URINE (LC/MS-MS)
Noroxycodone, Ur: 4614 ng/mL — AB (ref <50)
Oxycodone, ur: 212 ng/mL — AB (ref <50)
Oxymorphone: 2084 ng/mL — AB (ref <50)

## 2015-01-15 LAB — PRESCRIPTION MONITORING PROFILE (SOLSTAS)
Amphetamine/Meth: NEGATIVE ng/mL
BARBITURATE SCREEN, URINE: NEGATIVE ng/mL
BENZODIAZEPINE SCREEN, URINE: NEGATIVE ng/mL
Buprenorphine, Urine: NEGATIVE ng/mL
Cannabinoid Scrn, Ur: NEGATIVE ng/mL
Carisoprodol, Urine: NEGATIVE ng/mL
Cocaine Metabolites: NEGATIVE ng/mL
Creatinine, Urine: 104.31 mg/dL (ref >20.0)
Fentanyl, Ur: NEGATIVE ng/mL
MDMA URINE: NEGATIVE ng/mL
METHADONE SCREEN, URINE: NEGATIVE ng/mL
Meperidine, Ur: NEGATIVE ng/mL
NITRITES URINE, INITIAL: NEGATIVE ug/mL
PROPOXYPHENE: NEGATIVE ng/mL
TAPENTADOLUR: NEGATIVE ng/mL
Tramadol Scrn, Ur: NEGATIVE ng/mL
Zolpidem, Urine: NEGATIVE ng/mL
pH, Initial: 7 pH (ref 4.5–8.9)

## 2015-01-17 ENCOUNTER — Ambulatory Visit: Payer: Medicaid - Out of State | Admitting: Family Medicine

## 2015-01-18 ENCOUNTER — Emergency Department (HOSPITAL_COMMUNITY)
Admission: EM | Admit: 2015-01-18 | Discharge: 2015-01-18 | Disposition: A | Discharge status: Home / Self Care | Payer: Medicaid - Out of State | Attending: Emergency Medicine | Admitting: Emergency Medicine

## 2015-01-18 DIAGNOSIS — F17210 Nicotine dependence, cigarettes, uncomplicated: Secondary | ICD-10-CM | POA: Insufficient documentation

## 2015-01-18 DIAGNOSIS — D5700 Hb-SS disease with crisis, unspecified: Secondary | ICD-10-CM | POA: Diagnosis not present

## 2015-01-18 DIAGNOSIS — Z79899 Other long term (current) drug therapy: Secondary | ICD-10-CM | POA: Insufficient documentation

## 2015-01-18 MED ORDER — DIPHENHYDRAMINE HCL 25 MG PO CAPS
25.0000 mg | ORAL_CAPSULE | Freq: Once | ORAL | Status: AC
  Administered 2015-01-18: 25 mg via ORAL
  Filled 2015-01-18: qty 1

## 2015-01-18 MED ORDER — HYDROMORPHONE HCL 1 MG/ML IJ SOLN
1.0000 mg | Freq: Once | INTRAMUSCULAR | Status: AC
  Administered 2015-01-18: 1 mg via INTRAMUSCULAR
  Filled 2015-01-18: qty 1

## 2015-01-18 MED ORDER — KETOROLAC TROMETHAMINE 60 MG/2ML IM SOLN
60.0000 mg | Freq: Once | INTRAMUSCULAR | Status: AC
  Administered 2015-01-18: 60 mg via INTRAMUSCULAR
  Filled 2015-01-18: qty 2

## 2015-01-18 MED ORDER — ONDANSETRON 4 MG PO TBDP
4.0000 mg | ORAL_TABLET | Freq: Once | ORAL | Status: AC
  Administered 2015-01-18: 4 mg via ORAL
  Filled 2015-01-18: qty 1

## 2015-01-18 NOTE — ED Notes (Signed)
Pt reports sickle cell pain crisis x2 days, back and stomach pain. Pain 7/10.
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
## 2015-01-18 NOTE — Discharge Instructions (Signed)
Sickle Cell Anemia, Adult Sickle cell anemia is a condition in which red blood cells have an abnormal "sickle" shape. This abnormal shape shortens the cells' life span, which results in a lower than normal concentration of red blood cells in the blood. The sickle shape also causes the cells to clump together and block free blood flow through the blood vessels. As a result, the tissues and organs of the body do not receive enough oxygen. Sickle cell anemia causes organ damage and pain and increases the risk of infection. CAUSES  Sickle cell anemia is a genetic disorder. Those who receive two copies of the gene have the condition, and those who receive one copy have the trait. RISK FACTORS The sickle cell gene is most common in people whose families originated in [place]. Other areas of the globe where sickle cell trait occurs include the Mediterranean, [place] and [place], the [place], and the [place].  SIGNS AND SYMPTOMS  Pain, especially in the extremities, back, chest, or abdomen (common). The pain may start suddenly or may develop following an illness, especially if there is dehydration. Pain can also occur due to overexertion or exposure to extreme temperature changes.  Frequent severe bacterial infections, especially certain types of pneumonia and meningitis.  Pain and swelling in the hands and feet.  Decreased activity.   Loss of appetite.   Change in behavior.  Headaches.  Seizures.  Shortness of breath or difficulty breathing.  Vision changes.  Skin ulcers. Those with the trait may not have symptoms or they may have mild symptoms.  DIAGNOSIS  Sickle cell anemia is diagnosed with blood tests that demonstrate the genetic trait. It is often diagnosed during the newborn period, due to mandatory testing nationwide. A variety of blood tests, X-rays, CT scans, MRI scans, ultrasounds, and lung function tests may also be done to monitor the condition. TREATMENT  Sickle  cell anemia may be treated with:  Medicines. You may be given pain medicines, antibiotic medicines (to treat and prevent infections) or medicines to increase the production of certain types of hemoglobin.  Fluids.  Oxygen.  Blood transfusions. HOME CARE INSTRUCTIONS   Drink enough fluid to keep your urine clear or pale yellow. Increase your fluid intake in hot weather and during exercise.  Do not smoke. Smoking lowers oxygen levels in the blood.   Only take over-the-counter or prescription medicines for pain, fever, or discomfort as directed by your health care provider.  Take antibiotics as directed by your health care provider. Make sure you finish them it even if you start to feel better.   Take supplements as directed by your health care provider.   Consider wearing a medical alert bracelet. This tells anyone caring for you in an emergency of your condition.   When traveling, keep your medical information, health care provider's names, and the medicines you take with you at all times.   If you develop a fever, do not take medicines to reduce the fever right away. This could cover up a problem that is developing. Notify your health care provider.  Keep all follow-up appointments with your health care provider. Sickle cell anemia requires regular medical care. SEEK MEDICAL CARE IF: You have a fever. SEEK IMMEDIATE MEDICAL CARE IF:   You feel dizzy or faint.   You have new abdominal pain, especially on the left side near the stomach area.   You develop a persistent, often uncomfortable and painful penile erection (priapism). If this is not treated immediately it   will lead to impotence.   You have numbness your arms or legs or you have a hard time moving them.   You have a hard time with speech.   You have a fever or persistent symptoms for more than 2-3 days.   You have a fever and your symptoms suddenly get worse.   You have signs or symptoms of infection.  These include:   Chills.   Abnormal tiredness (lethargy).   Irritability.   Poor eating.   Vomiting.   You develop pain that is not helped with medicine.   You develop shortness of breath.  You have pain in your chest.   You are coughing up pus-like or bloody sputum.   You develop a stiff neck.  Your feet or hands swell or have pain.  Your abdomen appears bloated.  You develop joint pain. MAKE SURE YOU:  Understand these instructions.   This information is not intended to replace advice given to you by your health care provider. Make sure you discuss any questions you have with your health care provider.   Document Released: 05/06/2005 Document Revised: 02/16/2014 Document Reviewed: 09/07/2012 Elsevier Interactive Patient Education 2016 Elsevier Inc.  

## 2015-01-18 NOTE — ED Notes (Signed)
Pt called to triage room no response

## 2015-01-18 NOTE — ED Notes (Signed)
Pt requesting to see MD about labs from yesterday in hospital.  ? Wanting admission

## 2015-01-20 ENCOUNTER — Emergency Department (HOSPITAL_COMMUNITY)
Admission: EM | Admit: 2015-01-20 | Discharge: 2015-01-20 | Discharge status: Against Medical Advice | Payer: Medicaid - Out of State | Attending: Emergency Medicine | Admitting: Emergency Medicine

## 2015-01-20 ENCOUNTER — Emergency Department (HOSPITAL_COMMUNITY): Payer: Medicaid - Out of State

## 2015-01-20 ENCOUNTER — Encounter (HOSPITAL_COMMUNITY): Payer: Self-pay | Admitting: Emergency Medicine

## 2015-01-20 DIAGNOSIS — Z79899 Other long term (current) drug therapy: Secondary | ICD-10-CM | POA: Diagnosis not present

## 2015-01-20 DIAGNOSIS — F17210 Nicotine dependence, cigarettes, uncomplicated: Secondary | ICD-10-CM | POA: Diagnosis not present

## 2015-01-20 DIAGNOSIS — D5700 Hb-SS disease with crisis, unspecified: Secondary | ICD-10-CM | POA: Insufficient documentation

## 2015-01-20 DIAGNOSIS — R17 Unspecified jaundice: Secondary | ICD-10-CM | POA: Diagnosis not present

## 2015-01-20 DIAGNOSIS — Z79891 Long term (current) use of opiate analgesic: Secondary | ICD-10-CM | POA: Insufficient documentation

## 2015-01-20 LAB — URINALYSIS, ROUTINE W REFLEX MICROSCOPIC
Bilirubin Urine: NEGATIVE
Glucose, UA: NEGATIVE mg/dL
Hgb urine dipstick: NEGATIVE
KETONES UR: NEGATIVE mg/dL
LEUKOCYTES UA: NEGATIVE
NITRITE: NEGATIVE
PROTEIN: NEGATIVE mg/dL
Specific Gravity, Urine: 1.006 (ref 1.005–1.030)
pH: 6.5 (ref 5.0–8.0)

## 2015-01-20 LAB — COMPREHENSIVE METABOLIC PANEL
ALT: 16 U/L — ABNORMAL LOW (ref 17–63)
AST: 28 U/L (ref 15–41)
Albumin: 4.4 g/dL (ref 3.5–5.0)
Alkaline Phosphatase: 70 U/L (ref 38–126)
Anion gap: 8 (ref 5–15)
BILIRUBIN TOTAL: 4.9 mg/dL — AB (ref 0.3–1.2)
CHLORIDE: 107 mmol/L (ref 101–111)
CO2: 26 mmol/L (ref 22–32)
Calcium: 9.4 mg/dL (ref 8.9–10.3)
Creatinine, Ser: 0.43 mg/dL — ABNORMAL LOW (ref 0.61–1.24)
Glucose, Bld: 85 mg/dL (ref 65–99)
POTASSIUM: 3.8 mmol/L (ref 3.5–5.1)
Sodium: 141 mmol/L (ref 135–145)
TOTAL PROTEIN: 7.9 g/dL (ref 6.5–8.1)

## 2015-01-20 LAB — CBC WITH DIFFERENTIAL/PLATELET
BASOS ABS: 0.1 10*3/uL (ref 0.0–0.1)
BASOS PCT: 1 %
EOS PCT: 3 %
Eosinophils Absolute: 0.6 10*3/uL (ref 0.0–0.7)
HEMATOCRIT: 22 % — AB (ref 39.0–52.0)
Hemoglobin: 7.8 g/dL — ABNORMAL LOW (ref 13.0–17.0)
LYMPHS PCT: 25 %
Lymphs Abs: 4.2 10*3/uL — ABNORMAL HIGH (ref 0.7–4.0)
MCH: 32.6 pg (ref 26.0–34.0)
MCHC: 35.5 g/dL (ref 30.0–36.0)
MCV: 92.1 fL (ref 78.0–100.0)
MONO ABS: 1.7 10*3/uL — AB (ref 0.1–1.0)
Monocytes Relative: 10 %
NEUTROS ABS: 10.3 10*3/uL — AB (ref 1.7–7.7)
Neutrophils Relative %: 61 %
PLATELETS: 237 10*3/uL (ref 150–400)
RBC: 2.39 MIL/uL — AB (ref 4.22–5.81)
RDW: 19.5 % — AB (ref 11.5–15.5)
WBC: 16.9 10*3/uL — AB (ref 4.0–10.5)

## 2015-01-20 LAB — RETICULOCYTES
RBC.: 2.39 MIL/uL — AB (ref 4.22–5.81)
RETIC COUNT ABSOLUTE: 408.7 10*3/uL — AB (ref 19.0–186.0)
Retic Ct Pct: 17.1 % — ABNORMAL HIGH (ref 0.4–3.1)

## 2015-01-20 LAB — LIPASE, BLOOD: LIPASE: 21 U/L (ref 11–51)

## 2015-01-20 MED ORDER — DIPHENHYDRAMINE HCL 50 MG/ML IJ SOLN
12.5000 mg | Freq: Once | INTRAMUSCULAR | Status: AC
  Administered 2015-01-20: 12.5 mg via INTRAVENOUS
  Filled 2015-01-20: qty 1

## 2015-01-20 MED ORDER — OXYCODONE HCL 5 MG PO TABS
15.0000 mg | ORAL_TABLET | Freq: Once | ORAL | Status: AC
  Administered 2015-01-20: 15 mg via ORAL
  Filled 2015-01-20: qty 3

## 2015-01-20 MED ORDER — HYDROMORPHONE HCL 1 MG/ML IJ SOLN
1.0000 mg | Freq: Once | INTRAMUSCULAR | Status: AC
  Administered 2015-01-20: 1 mg via INTRAVENOUS
  Filled 2015-01-20: qty 1

## 2015-01-20 MED ORDER — SODIUM CHLORIDE 0.9 % IV BOLUS (SEPSIS)
1000.0000 mL | Freq: Once | INTRAVENOUS | Status: AC
  Administered 2015-01-20: 1000 mL via INTRAVENOUS

## 2015-01-20 MED ORDER — HYDROMORPHONE HCL 2 MG/ML IJ SOLN: 2.0000 mg | Freq: Once | INTRAMUSCULAR | Status: DC

## 2015-01-20 MED ORDER — ONDANSETRON HCL 4 MG/2ML IJ SOLN
4.0000 mg | Freq: Once | INTRAMUSCULAR | Status: AC
  Administered 2015-01-20: 4 mg via INTRAVENOUS
  Filled 2015-01-20: qty 2

## 2015-01-20 NOTE — ED Notes (Signed)
Bed: [id number] Expected date: 01/20/15 Expected time: 1:34 PM Means of arrival: Ambulance Comments: Sickle cell pain

## 2015-01-20 NOTE — ED Notes (Signed)
Upon entering pt's room to reassess pain pt was no longer there.  IV catheter noted on the floor.  Dr. Clarene DukeLittle is aware of pt leaving and advised that pt will be d/c'd as AMA.

## 2015-01-20 NOTE — ED Notes (Addendum)
Per EMS: Pt from home.  C/o abd pain, back pain, bilateral leg pain since last night.  States the pain started when he laid down to sleep last night.  No NVD or SOB.

## 2015-01-20 NOTE — ED Notes (Signed)
Patient unsuccessful at getting urine sample at this time.

## 2015-01-20 NOTE — ED Notes (Signed)
Patient given urinal to give sample
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
## 2015-01-21 ENCOUNTER — Encounter (HOSPITAL_COMMUNITY): Payer: Self-pay

## 2015-01-21 ENCOUNTER — Non-Acute Institutional Stay (HOSPITAL_COMMUNITY)
Admission: AD | Admit: 2015-01-21 | Discharge: 2015-01-21 | Disposition: A | Discharge status: Home / Self Care | Payer: Medicaid - Out of State | Attending: Internal Medicine | Admitting: Internal Medicine

## 2015-01-21 ENCOUNTER — Emergency Department (HOSPITAL_COMMUNITY)
Admission: EM | Admit: 2015-01-21 | Discharge: 2015-01-21 | Disposition: A | Discharge status: Home / Self Care | Payer: Medicaid - Out of State | Attending: Emergency Medicine | Admitting: Emergency Medicine

## 2015-01-21 DIAGNOSIS — Z79891 Long term (current) use of opiate analgesic: Secondary | ICD-10-CM | POA: Diagnosis not present

## 2015-01-21 DIAGNOSIS — D5700 Hb-SS disease with crisis, unspecified: Secondary | ICD-10-CM

## 2015-01-21 DIAGNOSIS — Z791 Long term (current) use of non-steroidal anti-inflammatories (NSAID): Secondary | ICD-10-CM | POA: Insufficient documentation

## 2015-01-21 DIAGNOSIS — Z79899 Other long term (current) drug therapy: Secondary | ICD-10-CM | POA: Insufficient documentation

## 2015-01-21 DIAGNOSIS — F17210 Nicotine dependence, cigarettes, uncomplicated: Secondary | ICD-10-CM | POA: Diagnosis not present

## 2015-01-21 DIAGNOSIS — M545 Low back pain: Secondary | ICD-10-CM | POA: Diagnosis present

## 2015-01-21 MED ORDER — HYDROMORPHONE 1 MG/ML IV SOLN
INTRAVENOUS | Status: DC
  Administered 2015-01-21: 4.5 mg via INTRAVENOUS
  Administered 2015-01-21: 13:00:00 via INTRAVENOUS
  Filled 2015-01-21: qty 25

## 2015-01-21 MED ORDER — DIPHENHYDRAMINE HCL 12.5 MG/5ML PO ELIX: 12.5000 mg | ORAL_SOLUTION | Freq: Four times a day (QID) | ORAL | Status: DC | PRN

## 2015-01-21 MED ORDER — NALOXONE HCL 0.4 MG/ML IJ SOLN: 0.4000 mg | INTRAMUSCULAR | Status: DC | PRN

## 2015-01-21 MED ORDER — SODIUM CHLORIDE 0.9 % IV SOLN
12.5000 mg | Freq: Four times a day (QID) | INTRAVENOUS | Status: DC | PRN
  Filled 2015-01-21: qty 0.25

## 2015-01-21 MED ORDER — KETOROLAC TROMETHAMINE 30 MG/ML IJ SOLN
30.0000 mg | Freq: Once | INTRAMUSCULAR | Status: AC
  Administered 2015-01-21: 30 mg via INTRAVENOUS
  Filled 2015-01-21: qty 1

## 2015-01-21 MED ORDER — HYDROMORPHONE HCL 2 MG/ML IJ SOLN
2.0000 mg | Freq: Once | INTRAMUSCULAR | Status: AC
  Administered 2015-01-21: 2 mg via SUBCUTANEOUS
  Filled 2015-01-21: qty 1

## 2015-01-21 MED ORDER — SODIUM CHLORIDE 0.9 % IJ SOLN: 9.0000 mL | INTRAMUSCULAR | Status: DC | PRN

## 2015-01-21 MED ORDER — KETOROLAC TROMETHAMINE 60 MG/2ML IM SOLN: 60.0000 mg | Freq: Once | INTRAMUSCULAR | Status: DC

## 2015-01-21 MED ORDER — ONDANSETRON HCL 4 MG/2ML IJ SOLN: 4.0000 mg | Freq: Four times a day (QID) | INTRAMUSCULAR | Status: DC | PRN

## 2015-01-21 MED ORDER — DEXTROSE-NACL 5-0.45 % IV SOLN
INTRAVENOUS | Status: DC
  Administered 2015-01-21: 13:00:00 via INTRAVENOUS

## 2015-01-21 MED ORDER — DIPHENHYDRAMINE HCL 25 MG PO CAPS: 25.0000 mg | ORAL_CAPSULE | Freq: Once | ORAL | Status: DC

## 2015-01-21 MED ORDER — HYDROMORPHONE HCL 1 MG/ML IJ SOLN: 1.0000 mg | Freq: Once | INTRAMUSCULAR | Status: DC

## 2015-01-21 NOTE — ED Notes (Signed)
Awake. Verbally responsive. A/O x4. Resp even and unlabored. No audible adventitious breath sounds noted. ABC's intact.  
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
## 2015-01-21 NOTE — ED Notes (Signed)
Bed: WA06 Expected date:  Expected time:  Means of arrival:  Comments: EMS- 24yo M, abdominal pain/SCC
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
## 2015-01-21 NOTE — Discharge Instructions (Signed)
Sickle Cell Anemia, Adult Sickle cell anemia is a condition in which red blood cells have an abnormal "sickle" shape. This abnormal shape shortens the cells' life span, which results in a lower than normal concentration of red blood cells in the blood. The sickle shape also causes the cells to clump together and block free blood flow through the blood vessels. As a result, the tissues and organs of the body do not receive enough oxygen. Sickle cell anemia causes organ damage and pain and increases the risk of infection. CAUSES  Sickle cell anemia is a genetic disorder. Those who receive two copies of the gene have the condition, and those who receive one copy have the trait. RISK FACTORS The sickle cell gene is most common in people whose families originated in [place]. Other areas of the globe where sickle cell trait occurs include the Mediterranean, [place] and [place], the [place], and the [place].  SIGNS AND SYMPTOMS  Pain, especially in the extremities, back, chest, or abdomen (common). The pain may start suddenly or may develop following an illness, especially if there is dehydration. Pain can also occur due to overexertion or exposure to extreme temperature changes.  Frequent severe bacterial infections, especially certain types of pneumonia and meningitis.  Pain and swelling in the hands and feet.  Decreased activity.   Loss of appetite.   Change in behavior.  Headaches.  Seizures.  Shortness of breath or difficulty breathing.  Vision changes.  Skin ulcers. Those with the trait may not have symptoms or they may have mild symptoms.  DIAGNOSIS  Sickle cell anemia is diagnosed with blood tests that demonstrate the genetic trait. It is often diagnosed during the newborn period, due to mandatory testing nationwide. A variety of blood tests, X-rays, CT scans, MRI scans, ultrasounds, and lung function tests may also be done to monitor the condition. TREATMENT  Sickle  cell anemia may be treated with:  Medicines. You may be given pain medicines, antibiotic medicines (to treat and prevent infections) or medicines to increase the production of certain types of hemoglobin.  Fluids.  Oxygen.  Blood transfusions. HOME CARE INSTRUCTIONS   Drink enough fluid to keep your urine clear or pale yellow. Increase your fluid intake in hot weather and during exercise.  Do not smoke. Smoking lowers oxygen levels in the blood.   Only take over-the-counter or prescription medicines for pain, fever, or discomfort as directed by your health care provider.  Take antibiotics as directed by your health care provider. Make sure you finish them it even if you start to feel better.   Take supplements as directed by your health care provider.   Consider wearing a medical alert bracelet. This tells anyone caring for you in an emergency of your condition.   When traveling, keep your medical information, health care provider's names, and the medicines you take with you at all times.   If you develop a fever, do not take medicines to reduce the fever right away. This could cover up a problem that is developing. Notify your health care provider.  Keep all follow-up appointments with your health care provider. Sickle cell anemia requires regular medical care. SEEK MEDICAL CARE IF: You have a fever. SEEK IMMEDIATE MEDICAL CARE IF:   You feel dizzy or faint.   You have new abdominal pain, especially on the left side near the stomach area.   You develop a persistent, often uncomfortable and painful penile erection (priapism). If this is not treated immediately it   will lead to impotence.   You have numbness your arms or legs or you have a hard time moving them.   You have a hard time with speech.   You have a fever or persistent symptoms for more than 2-3 days.   You have a fever and your symptoms suddenly get worse.   You have signs or symptoms of infection.  These include:   Chills.   Abnormal tiredness (lethargy).   Irritability.   Poor eating.   Vomiting.   You develop pain that is not helped with medicine.   You develop shortness of breath.  You have pain in your chest.   You are coughing up pus-like or bloody sputum.   You develop a stiff neck.  Your feet or hands swell or have pain.  Your abdomen appears bloated.  You develop joint pain. MAKE SURE YOU:  Understand these instructions.   This information is not intended to replace advice given to you by your health care provider. Make sure you discuss any questions you have with your health care provider.   Document Released: 05/06/2005 Document Revised: 02/16/2014 Document Reviewed: 09/07/2012 Elsevier Interactive Patient Education 2016 Elsevier Inc.  

## 2015-01-21 NOTE — Progress Notes (Signed)
Pt discharged to home; discharge instructions explained, given, and signed; all questions answered; pt alert, oriented, and ambulatory; no complications noted 

## 2015-01-21 NOTE — Discharge Summary (Signed)
[facility] Discharge Summary   Patient ID: Matthew Shannon MRN: [id number] DOB/AGE: 26/04/1990 26 y.o.  Admit date: 01/21/2015 Discharge date: 01/21/2015  Primary Care Physician:  Jeanann LewandowskyJEGEDE, OLUGBEMIGA, MD  Admission Diagnoses:  Active Problems:   Sickle cell anemia with pain [facility](HCC)  Discharge Medications:    Medication List    ASK your doctor about these medications        folic acid 1 MG tablet  Commonly known as:  FOLVITE  Take 1 tablet (1 mg total) by mouth daily.     hydroxyurea 500 MG capsule  Commonly known as:  HYDREA  Take 2 capsules (1,000 mg total) by mouth daily. May take with food to minimize GI side effects.     ibuprofen 600 MG tablet  Commonly known as:  ADVIL,MOTRIN  Take 1 tablet (600 mg total) by mouth every 8 (eight) hours as needed.     morphine 30 MG 12 hr tablet  Commonly known as:  MS CONTIN  Take 1 tablet (30 mg total) by mouth every 12 (twelve) hours.     nicotine 14 mg/24hr patch  Commonly known as:  NICODERM CQ  Place 1 patch (14 mg total) onto the skin daily.     ondansetron 8 MG tablet  Commonly known as:  ZOFRAN  Take 1 tablet (8 mg total) by mouth every 8 (eight) hours as needed for nausea or vomiting.     oxyCODONE 15 MG immediate release tablet  Commonly known as:  ROXICODONE  Take 1 tablet (15 mg total) by mouth every 4 (four) hours as needed.     oxyCODONE-acetaminophen 5-325 MG tablet  Commonly known as:  PERCOCET/ROXICET  Take 2 tablets by mouth every 4 (four) hours as needed for severe pain.         Consults:  None  Significant Diagnostic Studies:  Dg Chest 2 View  01/20/2015  CLINICAL DATA:  Sickle cell pain crisis.  Back pain. EXAM: CHEST  2 VIEW COMPARISON:  January 05, 2015. FINDINGS: The heart size and mediastinal contours are within normal limits. Both lungs are clear. No pneumothorax or pleural effusion is noted. Mild dextroscoliosis of lower thoracic spine is noted. IMPRESSION: No active  cardiopulmonary disease. Electronically Signed   By: Lupita RaiderJames  Green Jr, M.D.   On: 01/20/2015 17:31   Dg Chest 2 View  01/05/2015  CLINICAL DATA:  Acute onset of generalized back and abdominal pain. Current history of sickle cell disease. Initial encounter. EXAM: CHEST  2 VIEW COMPARISON:  Chest radiograph performed 12/19/2014 FINDINGS: The lungs are well-aerated. Minimal right-sided airspace opacities may reflect acute chest syndrome, given the patient's symptoms. There is no evidence of pleural effusion or pneumothorax. The heart is normal in size; the mediastinal contour is within normal limits. No acute osseous abnormalities are seen. Clips are noted within the right upper quadrant, reflecting prior cholecystectomy. IMPRESSION: Minimal right-sided airspace opacities may reflect acute chest syndrome, given the patient's symptoms. Electronically Signed   By: Roanna RaiderJeffery  Chang M.D.   On: 01/05/2015 03:44   Ct Abdomen Pelvis W Contrast  01/05/2015  CLINICAL DATA:  Acute onset of mild back pain and left-sided abdominal pain. Leukocytosis. Current history of sickle cell disease. Initial encounter. EXAM: CT ABDOMEN AND PELVIS WITH CONTRAST TECHNIQUE: Multidetector CT imaging of the abdomen and pelvis was performed using the standard protocol following bolus administration of intravenous contrast. CONTRAST:  100mL OMNIPAQUE IOHEXOL 300 MG/ML  SOLN COMPARISON:  None. FINDINGS: Minimal bibasilar atelectasis is noted.  The liver is unremarkable. The spleen is diminutive in appearance. The patient is status post cholecystectomy, with clips noted at the gallbladder fossa. The pancreas and adrenal glands are unremarkable. The kidneys are unremarkable in appearance. There is no evidence of hydronephrosis. No renal or ureteral stones are seen. No perinephric stranding is appreciated. No free fluid is identified. The small bowel is unremarkable in appearance. The stomach is within normal limits. No acute vascular abnormalities  are seen. The appendix is normal in caliber, without evidence of appendicitis. The colon is partially filled with stool, and is unremarkable in appearance. The bladder is moderately distended and grossly unremarkable. The prostate remains normal in size. No inguinal lymphadenopathy is seen. No acute osseous abnormalities are identified. H-shaped vertebral bodies reflects the patient's sickle cell disease. IMPRESSION: No acute abnormality seen within the abdomen or pelvis. Electronically Signed   By: Roanna Raider M.D.   On: 01/05/2015 04:45     [facility] Course: Patient was admitted to the day infusion center. Reviewed labs, consistent with previous values. Patient states that has been taking  medication as previously prescribed. Mr. Rossbach and I discussed medications at length. Patient was started on high concentration PCA dilaudid per weight based protocol. He used a total of 4.5 mg with 9 demands and 9 deliveries. Patient reports that his pain intensity has decreased to 3/10.  Recommend that patient increase fluid intake to 64 ounces every other hour. Patient is to follow up in office as previously scheduled.    Physical Exam at Discharge:  BP 100/51 mmHg  Pulse 93  Temp(Src) 99 F (37.2 C) (Oral)  Resp 18  SpO2 99%   Disposition at Discharge: Discharged in stable condition  Discharge Orders:   Condition at Discharge:   Stable  Time spent on Discharge:    Signed: Kamoni Depree M 01/21/2015, 2:49 PM

## 2015-01-21 NOTE — ED Notes (Signed)
Patient states tht he called the [facility], but they did not have any room for him. Patient states he is having abdominal and back pain. Patient rates pain 7/10.

## 2015-01-21 NOTE — ED Notes (Signed)
Per EMS- Patient was seen in the ED yesterday for abdominal pain. Patient reported to EMS that he felt fine when he left the ED yesterday, but today the pain is back. Patient denies any N/V/D.

## 2015-01-23 ENCOUNTER — Encounter (HOSPITAL_COMMUNITY): Payer: Self-pay | Admitting: Emergency Medicine

## 2015-01-23 ENCOUNTER — Inpatient Hospital Stay (HOSPITAL_COMMUNITY)
Admission: EM | Admit: 2015-01-23 | Discharge: 2015-01-26 | DRG: 812 | Disposition: A | Discharge status: Home / Self Care | Payer: Medicaid Other | Attending: Internal Medicine | Admitting: Internal Medicine

## 2015-01-23 DIAGNOSIS — Z79899 Other long term (current) drug therapy: Secondary | ICD-10-CM

## 2015-01-23 DIAGNOSIS — D5700 Hb-SS disease with crisis, unspecified: Principal | ICD-10-CM | POA: Diagnosis present

## 2015-01-23 DIAGNOSIS — Z79891 Long term (current) use of opiate analgesic: Secondary | ICD-10-CM

## 2015-01-23 DIAGNOSIS — R701 Abnormal plasma viscosity: Secondary | ICD-10-CM | POA: Diagnosis present

## 2015-01-23 DIAGNOSIS — Z833 Family history of diabetes mellitus: Secondary | ICD-10-CM

## 2015-01-23 DIAGNOSIS — Z832 Family history of diseases of the blood and blood-forming organs and certain disorders involving the immune mechanism: Secondary | ICD-10-CM

## 2015-01-23 DIAGNOSIS — D638 Anemia in other chronic diseases classified elsewhere: Secondary | ICD-10-CM | POA: Insufficient documentation

## 2015-01-23 DIAGNOSIS — F17210 Nicotine dependence, cigarettes, uncomplicated: Secondary | ICD-10-CM | POA: Diagnosis present

## 2015-01-23 DIAGNOSIS — G8929 Other chronic pain: Secondary | ICD-10-CM

## 2015-01-23 DIAGNOSIS — D72829 Elevated white blood cell count, unspecified: Secondary | ICD-10-CM | POA: Diagnosis present

## 2015-01-23 DIAGNOSIS — Z825 Family history of asthma and other chronic lower respiratory diseases: Secondary | ICD-10-CM

## 2015-01-23 DIAGNOSIS — Z720 Tobacco use: Secondary | ICD-10-CM | POA: Diagnosis present

## 2015-01-23 LAB — CBC WITH DIFFERENTIAL/PLATELET
BASOS ABS: 0.1 10*3/uL (ref 0.0–0.1)
BASOS PCT: 0 %
Eosinophils Absolute: 0.5 10*3/uL (ref 0.0–0.7)
Eosinophils Relative: 3 %
HEMATOCRIT: 22.3 % — AB (ref 39.0–52.0)
Hemoglobin: 7.9 g/dL — ABNORMAL LOW (ref 13.0–17.0)
Lymphocytes Relative: 16 %
Lymphs Abs: 3.4 10*3/uL (ref 0.7–4.0)
MCH: 33.5 pg (ref 26.0–34.0)
MCHC: 35.4 g/dL (ref 30.0–36.0)
MCV: 94.5 fL (ref 78.0–100.0)
MONO ABS: 2.1 10*3/uL — AB (ref 0.1–1.0)
Monocytes Relative: 10 %
NEUTROS ABS: 14.7 10*3/uL — AB (ref 1.7–7.7)
Neutrophils Relative %: 71 %
PLATELETS: 280 10*3/uL (ref 150–400)
RBC: 2.36 MIL/uL — AB (ref 4.22–5.81)
RDW: 20.3 % — AB (ref 11.5–15.5)
WBC: 20.8 10*3/uL — AB (ref 4.0–10.5)

## 2015-01-23 LAB — COMPREHENSIVE METABOLIC PANEL
ALBUMIN: 4.8 g/dL (ref 3.5–5.0)
ALK PHOS: 77 U/L (ref 38–126)
ALT: 15 U/L — ABNORMAL LOW (ref 17–63)
ANION GAP: 8 (ref 5–15)
AST: 26 U/L (ref 15–41)
BILIRUBIN TOTAL: 4.9 mg/dL — AB (ref 0.3–1.2)
BUN: 6 mg/dL (ref 6–20)
CALCIUM: 9.5 mg/dL (ref 8.9–10.3)
CO2: 26 mmol/L (ref 22–32)
Chloride: 106 mmol/L (ref 101–111)
Creatinine, Ser: 0.46 mg/dL — ABNORMAL LOW (ref 0.61–1.24)
GLUCOSE: 107 mg/dL — AB (ref 65–99)
POTASSIUM: 3.9 mmol/L (ref 3.5–5.1)
Sodium: 140 mmol/L (ref 135–145)
TOTAL PROTEIN: 7.8 g/dL (ref 6.5–8.1)

## 2015-01-23 LAB — RETICULOCYTES
RBC.: 2.31 MIL/uL — AB (ref 4.22–5.81)
RETIC COUNT ABSOLUTE: 445.8 10*3/uL — AB (ref 19.0–186.0)
Retic Ct Pct: 19.3 % — ABNORMAL HIGH (ref 0.4–3.1)

## 2015-01-23 MED ORDER — HYDROMORPHONE HCL 2 MG/ML IJ SOLN
2.0000 mg | INTRAMUSCULAR | Status: AC | PRN
  Administered 2015-01-23 (×3): 2 mg via INTRAVENOUS
  Filled 2015-01-23 (×3): qty 1

## 2015-01-23 MED ORDER — DIPHENHYDRAMINE HCL 25 MG PO CAPS
25.0000 mg | ORAL_CAPSULE | Freq: Once | ORAL | Status: AC
  Administered 2015-01-23: 25 mg via ORAL
  Filled 2015-01-23: qty 1

## 2015-01-23 MED ORDER — SODIUM CHLORIDE 0.9 % IV BOLUS (SEPSIS)
1000.0000 mL | Freq: Once | INTRAVENOUS | Status: AC
  Administered 2015-01-23: 1000 mL via INTRAVENOUS

## 2015-01-23 MED ORDER — KETOROLAC TROMETHAMINE 30 MG/ML IJ SOLN
30.0000 mg | Freq: Once | INTRAMUSCULAR | Status: AC
  Administered 2015-01-23: 30 mg via INTRAVENOUS
  Filled 2015-01-23: qty 1

## 2015-01-23 MED ORDER — ONDANSETRON HCL 4 MG/2ML IJ SOLN
4.0000 mg | Freq: Once | INTRAMUSCULAR | Status: AC
  Administered 2015-01-23: 4 mg via INTRAVENOUS
  Filled 2015-01-23: qty 2

## 2015-01-23 NOTE — ED Notes (Signed)
Pt complaining of abdominal and lower back pain starting three days ago, states its his sickle cell pain. Does not appear to be in any obvious signs of distress in triage. Also complaining of nausea. Denies CP, SOB, fever chills

## 2015-01-23 NOTE — Progress Notes (Signed)
Patient noted to have had 12 admissions with 52 ED visits within the last six months.  Patient was seen in the day infusion center at sickle cell clinic on 12/12.  Patient has a follow up visit scheduled at [facility] Cell clinic on 12/16 @ 1400pm with NP Hollis.

## 2015-01-23 NOTE — ED Notes (Signed)
Pt requesting more pain meds

## 2015-01-23 NOTE — ED Notes (Signed)
Nurse drawing labs. 

## 2015-01-24 ENCOUNTER — Encounter (HOSPITAL_COMMUNITY): Payer: Self-pay | Admitting: Internal Medicine

## 2015-01-24 DIAGNOSIS — D5700 Hb-SS disease with crisis, unspecified: Secondary | ICD-10-CM | POA: Diagnosis present

## 2015-01-24 DIAGNOSIS — R701 Abnormal plasma viscosity: Secondary | ICD-10-CM | POA: Diagnosis present

## 2015-01-24 DIAGNOSIS — F17210 Nicotine dependence, cigarettes, uncomplicated: Secondary | ICD-10-CM | POA: Diagnosis present

## 2015-01-24 DIAGNOSIS — Z720 Tobacco use: Secondary | ICD-10-CM | POA: Diagnosis not present

## 2015-01-24 DIAGNOSIS — D72829 Elevated white blood cell count, unspecified: Secondary | ICD-10-CM | POA: Diagnosis present

## 2015-01-24 DIAGNOSIS — Z832 Family history of diseases of the blood and blood-forming organs and certain disorders involving the immune mechanism: Secondary | ICD-10-CM | POA: Diagnosis not present

## 2015-01-24 DIAGNOSIS — Z833 Family history of diabetes mellitus: Secondary | ICD-10-CM | POA: Diagnosis not present

## 2015-01-24 DIAGNOSIS — R109 Unspecified abdominal pain: Secondary | ICD-10-CM | POA: Diagnosis not present

## 2015-01-24 DIAGNOSIS — Z79891 Long term (current) use of opiate analgesic: Secondary | ICD-10-CM | POA: Diagnosis not present

## 2015-01-24 DIAGNOSIS — D638 Anemia in other chronic diseases classified elsewhere: Secondary | ICD-10-CM

## 2015-01-24 DIAGNOSIS — Z825 Family history of asthma and other chronic lower respiratory diseases: Secondary | ICD-10-CM | POA: Diagnosis not present

## 2015-01-24 DIAGNOSIS — Z79899 Other long term (current) drug therapy: Secondary | ICD-10-CM | POA: Diagnosis not present

## 2015-01-24 LAB — COMPREHENSIVE METABOLIC PANEL
ALT: 10 U/L — AB (ref 17–63)
AST: 19 U/L (ref 15–41)
Albumin: 3.7 g/dL (ref 3.5–5.0)
Alkaline Phosphatase: 76 U/L (ref 38–126)
Anion gap: 6 (ref 5–15)
BILIRUBIN TOTAL: 4.1 mg/dL — AB (ref 0.3–1.2)
BUN: 6 mg/dL (ref 6–20)
CHLORIDE: 107 mmol/L (ref 101–111)
CO2: 28 mmol/L (ref 22–32)
CREATININE: 0.5 mg/dL — AB (ref 0.61–1.24)
Calcium: 8.8 mg/dL — ABNORMAL LOW (ref 8.9–10.3)
GFR calc Af Amer: >60 mL/min (ref >60)
GLUCOSE: 112 mg/dL — AB (ref 65–99)
Potassium: 3.5 mmol/L (ref 3.5–5.1)
Sodium: 141 mmol/L (ref 135–145)
Total Protein: 6.9 g/dL (ref 6.5–8.1)

## 2015-01-24 LAB — CBC WITH DIFFERENTIAL/PLATELET
BASOS PCT: 0 %
Basophils Absolute: 0 10*3/uL (ref 0.0–0.1)
EOS ABS: 0.7 10*3/uL (ref 0.0–0.7)
EOS PCT: 4 %
HEMATOCRIT: 17.7 % — AB (ref 39.0–52.0)
HEMOGLOBIN: 6.3 g/dL — AB (ref 13.0–17.0)
LYMPHS PCT: 32 %
Lymphs Abs: 5.8 10*3/uL — ABNORMAL HIGH (ref 0.7–4.0)
MCH: 34.2 pg — AB (ref 26.0–34.0)
MCHC: 35.6 g/dL (ref 30.0–36.0)
MCV: 96.2 fL (ref 78.0–100.0)
Monocytes Absolute: 1.8 10*3/uL — ABNORMAL HIGH (ref 0.1–1.0)
Monocytes Relative: 10 %
NEUTROS ABS: 9.9 10*3/uL — AB (ref 1.7–7.7)
Neutrophils Relative %: 54 %
PLATELETS: 184 10*3/uL (ref 150–400)
RBC: 1.84 MIL/uL — ABNORMAL LOW (ref 4.22–5.81)
RDW: 20.2 % — ABNORMAL HIGH (ref 11.5–15.5)
WBC: 18.2 10*3/uL — ABNORMAL HIGH (ref 4.0–10.5)

## 2015-01-24 LAB — TYPE AND SCREEN
ABO/RH(D): O POS
Antibody Screen: NEGATIVE

## 2015-01-24 LAB — URINALYSIS, ROUTINE W REFLEX MICROSCOPIC
Bilirubin Urine: NEGATIVE
Glucose, UA: NEGATIVE mg/dL
Hgb urine dipstick: NEGATIVE
Ketones, ur: NEGATIVE mg/dL
LEUKOCYTES UA: NEGATIVE
NITRITE: NEGATIVE
PROTEIN: NEGATIVE mg/dL
SPECIFIC GRAVITY, URINE: 1.011 (ref 1.005–1.030)
pH: 6.5 (ref 5.0–8.0)

## 2015-01-24 LAB — ABO/RH: ABO/RH(D): O POS

## 2015-01-24 LAB — PHOSPHORUS: Phosphorus: 4 mg/dL (ref 2.5–4.6)

## 2015-01-24 LAB — RETICULOCYTES
RBC.: 1.84 MIL/uL — ABNORMAL LOW (ref 4.22–5.81)
RETIC COUNT ABSOLUTE: 388.2 10*3/uL — AB (ref 19.0–186.0)
Retic Ct Pct: 21.1 % — ABNORMAL HIGH (ref 0.4–3.1)

## 2015-01-24 LAB — MAGNESIUM: MAGNESIUM: 1.7 mg/dL (ref 1.7–2.4)

## 2015-01-24 MED ORDER — NICOTINE 14 MG/24HR TD PT24
14.0000 mg | MEDICATED_PATCH | Freq: Every day | TRANSDERMAL | Status: DC
  Administered 2015-01-24 – 2015-01-26 (×3): 14 mg via TRANSDERMAL
  Filled 2015-01-24 (×3): qty 1

## 2015-01-24 MED ORDER — IBUPROFEN 800 MG PO TABS: 800.0000 mg | ORAL_TABLET | Freq: Three times a day (TID) | ORAL | Status: DC | PRN

## 2015-01-24 MED ORDER — ENOXAPARIN SODIUM 40 MG/0.4ML ~~LOC~~ SOLN
40.0000 mg | SUBCUTANEOUS | Status: DC
  Filled 2015-01-24 (×2): qty 0.4

## 2015-01-24 MED ORDER — MORPHINE SULFATE ER 30 MG PO TBCR
30.0000 mg | EXTENDED_RELEASE_TABLET | Freq: Two times a day (BID) | ORAL | Status: DC
  Administered 2015-01-24 – 2015-01-26 (×6): 30 mg via ORAL
  Filled 2015-01-24 (×6): qty 1

## 2015-01-24 MED ORDER — ONDANSETRON HCL 4 MG PO TABS: 4.0000 mg | ORAL_TABLET | ORAL | Status: DC | PRN

## 2015-01-24 MED ORDER — DIPHENHYDRAMINE HCL 50 MG/ML IJ SOLN
12.5000 mg | Freq: Four times a day (QID) | INTRAMUSCULAR | Status: DC | PRN
  Administered 2015-01-24: 12.5 mg via INTRAVENOUS
  Filled 2015-01-24 (×3): qty 0.25

## 2015-01-24 MED ORDER — HYDROXYZINE HCL 25 MG PO TABS: 25.0000 mg | ORAL_TABLET | ORAL | Status: DC | PRN

## 2015-01-24 MED ORDER — ONDANSETRON HCL 4 MG/2ML IJ SOLN: 4.0000 mg | Freq: Four times a day (QID) | INTRAMUSCULAR | Status: DC | PRN

## 2015-01-24 MED ORDER — DEXTROSE-NACL 5-0.45 % IV SOLN
INTRAVENOUS | Status: AC
  Administered 2015-01-24: 03:00:00 via INTRAVENOUS

## 2015-01-24 MED ORDER — DIPHENHYDRAMINE HCL 12.5 MG/5ML PO ELIX
12.5000 mg | ORAL_SOLUTION | Freq: Four times a day (QID) | ORAL | Status: DC | PRN
  Filled 2015-01-24: qty 5

## 2015-01-24 MED ORDER — ONDANSETRON HCL 4 MG/2ML IJ SOLN: 4.0000 mg | INTRAMUSCULAR | Status: DC | PRN

## 2015-01-24 MED ORDER — HYDROXYUREA 500 MG PO CAPS
1000.0000 mg | ORAL_CAPSULE | Freq: Every day | ORAL | Status: DC
  Administered 2015-01-24 – 2015-01-26 (×3): 1000 mg via ORAL
  Filled 2015-01-24 (×3): qty 2

## 2015-01-24 MED ORDER — HYDROMORPHONE HCL 4 MG PO TABS
4.0000 mg | ORAL_TABLET | ORAL | Status: DC
  Administered 2015-01-24 – 2015-01-26 (×12): 4 mg via ORAL
  Filled 2015-01-24 (×12): qty 1

## 2015-01-24 MED ORDER — SENNOSIDES-DOCUSATE SODIUM 8.6-50 MG PO TABS
1.0000 | ORAL_TABLET | Freq: Two times a day (BID) | ORAL | Status: DC
  Administered 2015-01-24 – 2015-01-26 (×2): 1 via ORAL
  Filled 2015-01-24 (×3): qty 1

## 2015-01-24 MED ORDER — POLYETHYLENE GLYCOL 3350 17 G PO PACK: 17.0000 g | PACK | Freq: Every day | ORAL | Status: DC | PRN

## 2015-01-24 MED ORDER — FOLIC ACID 1 MG PO TABS
1.0000 mg | ORAL_TABLET | Freq: Every day | ORAL | Status: DC
  Administered 2015-01-24 – 2015-01-26 (×3): 1 mg via ORAL
  Filled 2015-01-24 (×3): qty 1

## 2015-01-24 MED ORDER — HYDROMORPHONE 1 MG/ML IV SOLN
INTRAVENOUS | Status: DC
  Administered 2015-01-24: 0.55 mg via INTRAVENOUS
  Administered 2015-01-24: 1.1 mg via INTRAVENOUS
  Administered 2015-01-24: 4.4 mg via INTRAVENOUS
  Administered 2015-01-24 – 2015-01-25 (×2): 0.55 mg via INTRAVENOUS
  Administered 2015-01-25: 7.15 mg via INTRAVENOUS
  Administered 2015-01-25: 2.75 mg via INTRAVENOUS
  Administered 2015-01-25: 22:00:00 via INTRAVENOUS
  Administered 2015-01-25: 4.95 mg via INTRAVENOUS
  Administered 2015-01-25: 6.05 mg via INTRAVENOUS
  Administered 2015-01-25: 5.5 mg via INTRAVENOUS
  Administered 2015-01-25: 3.3 mg via INTRAVENOUS
  Administered 2015-01-26: 2.19 mg via INTRAVENOUS
  Administered 2015-01-26: 4.95 mg via INTRAVENOUS
  Administered 2015-01-26: 2.75 mg via INTRAVENOUS
  Filled 2015-01-24 (×3): qty 25

## 2015-01-24 MED ORDER — NALOXONE HCL 0.4 MG/ML IJ SOLN
0.4000 mg | INTRAMUSCULAR | Status: DC | PRN
Start: 2015-01-24 — End: 2015-01-26

## 2015-01-24 MED ORDER — KETOROLAC TROMETHAMINE 30 MG/ML IJ SOLN
30.0000 mg | Freq: Four times a day (QID) | INTRAMUSCULAR | Status: DC
  Administered 2015-01-24 – 2015-01-26 (×7): 30 mg via INTRAVENOUS
  Filled 2015-01-24 (×7): qty 1

## 2015-01-24 MED ORDER — SODIUM CHLORIDE 0.9 % IJ SOLN: 9.0000 mL | INTRAMUSCULAR | Status: DC | PRN
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
## 2015-01-24 NOTE — Plan of Care (Signed)
Problem: Phase III Progression Outcomes Goal: Pain controlled on oral analgesia Outcome: Not Progressing Dilaudid PCA Goal: Progress with ambulation Outcome: Progressing Patient ambulating. Tolerating well.

## 2015-01-24 NOTE — Progress Notes (Signed)
SICKLE CELL SERVICE PROGRESS NOTE  Matthew Shannon ZOX:[id number] DOB: [birth year] DOA: 01/23/2015 PCP: Jeanann Lewandowsky, MD  Assessment/Plan: Active Problems:   Sickle cell pain crisis (HCC)   Tobacco abuse   Sickle cell crisis (HCC)   Leukocytosis  1. Hb SS with crisis: Pt rates his pain as 6/10. Pt feels that he can transition to oral analgesics. He has used 12.1 mg with 25/22:demands/deliveries. Will transition to oral dilaudid and continue PCA for PRN use. Continue Toradol and re-assess pain tomorrow. 2. Leukocytosis: No evidence of infection however will check urine to evaluate for infection. Feel that this is secondary to bone marrow activity and inflammation. 3. Anemia of chronic disease: Pt asymptomatic and with a robust reticulocytosis. Expect recovery of baseline Hb in a few days. 4. Chronic pain: Continue MS Contin 5. Follow-up Care: Pt reports that he does not intend to [facility] However he does not have a PMD in mind to go to. I have advised patient that he should continue his care with the [facility] until he can appropriately transition his care elsewhere.  Code Status: Full Code Family Communication: N/A Disposition Plan: Not yet ready for discharge  Lucious Zou A.  Pager [phone]. If 7PM-7AM, please contact night-coverage.  01/24/2015, 3:05 PM  LOS: 0 days   Interim History: Pt rates his pain as 6/10 and localized ot back and legs. Last BM yesterday.  Consultants:  None  Procedures:  None  Antibiotics:  None   Objective: Filed Vitals:   01/24/15 0625 01/24/15 0751 01/24/15 1217 01/24/15 1443  BP: 101/59   100/49  Pulse: 71   81  Temp: 97.8 F (36.6 C)   98.4 F (36.9 C)  TempSrc: Oral   Oral  Resp: Height:      Weight:      SpO2: 97% 97% 94% 94%   Weight change:   Intake/Output Summary (Last 24 hours) at 01/24/15 1505 Last data filed at 01/24/15 1443  Gross per 24 hour  Intake      0 ml  Output    1550 ml  Net  -1550 ml    General: Alert, awake, oriented x3, in no acute distress.  HEENT: [place]/AT PEERL, EOMI, mild icterus Neck: Trachea midline,  no masses, no thyromegal,y no JVD, no carotid bruit OROPHARYNX:  Moist, No exudate/ erythema/lesions.  Heart: Regular rate and rhythm, without murmurs, rubs, gallops, PMI non-displaced, no heaves or thrills on palpation.  Lungs: Clear to auscultation, no wheezing or rhonchi noted. No increased vocal fremitus resonant to percussion  Abdomen: Soft, nontender, nondistended, positive bowel sounds, no masses no hepatosplenomegaly noted.  Neuro: No focal neurological deficits noted cranial nerves II through XII grossly intact.  Strength at functional baseline in bilateral upper and lower extremities. Musculoskeletal: No warm swelling or erythema around joints, no spinal tenderness noted. Psychiatric: Patient alert and oriented x3, good insight and cognition, good recent to remote recall.    Data Reviewed: Basic Metabolic Panel:  Recent Labs Lab 01/20/15 1540 01/23/15 1920 01/24/15 0400  NA 141 140 141  K 3.8 3.9 3.5  CL 107 106 107  CO2 GLUCOSE 85 107* 112*  BUN <5* 6 6  CREATININE 0.43* 0.46* 0.50*  CALCIUM 9.4 9.5 8.8*  MG  --   --  1.7  PHOS  --   --  4.0   Liver Function Tests:  Recent Labs Lab 01/20/15 1540 01/23/15 1920 01/24/15 0400  AST 28 26 19   ALT 16* 15* 10*  ALKPHOS 70 77 76  BILITOT 4.9* 4.9* 4.1*  PROT 7.9 7.8 6.9  ALBUMIN 4.4 4.8 3.7    Recent Labs Lab 01/20/15 1540  LIPASE 21   No results for input(s): AMMONIA in the last 168 hours. CBC:  Recent Labs Lab 01/20/15 1540 01/23/15 1920 01/24/15 0400  WBC 16.9* 20.8* 18.2*  NEUTROABS 10.3* 14.7* 9.9*  HGB 7.8* 7.9* 6.3*  HCT 22.0* 22.3* 17.7*  MCV 92.1 94.5 96.2  PLT 237 280 184   Cardiac Enzymes: No results for input(s): CKTOTAL, CKMB, CKMBINDEX, TROPONINI in the last 168 hours. BNP (last 3 results) No results for input(s): BNP in  the last 8760 hours.  ProBNP (last 3 results) No results for input(s): PROBNP in the last 8760 hours.  CBG: No results for input(s): GLUCAP in the last 168 hours.  No results found for this or any previous visit (from the past 240 hour(s)).   Studies: Dg Chest 2 View  01/20/2015  CLINICAL DATA:  Sickle cell pain crisis.  Back pain. EXAM: CHEST  2 VIEW COMPARISON:  January 05, 2015. FINDINGS: The heart size and mediastinal contours are within normal limits. Both lungs are clear. No pneumothorax or pleural effusion is noted. Mild dextroscoliosis of lower thoracic spine is noted. IMPRESSION: No active cardiopulmonary disease. Electronically Signed   By: Lupita RaiderJames  Green Jr, M.D.   On: 01/20/2015 17:31   Dg Chest 2 View  01/05/2015  CLINICAL DATA:  Acute onset of generalized back and abdominal pain. Current history of sickle cell disease. Initial encounter. EXAM: CHEST  2 VIEW COMPARISON:  Chest radiograph performed 12/19/2014 FINDINGS: The lungs are well-aerated. Minimal right-sided airspace opacities may reflect acute chest syndrome, given the patient's symptoms. There is no evidence of pleural effusion or pneumothorax. The heart is normal in size; the mediastinal contour is within normal limits. No acute osseous abnormalities are seen. Clips are noted within the right upper quadrant, reflecting prior cholecystectomy. IMPRESSION: Minimal right-sided airspace opacities may reflect acute chest syndrome, given the patient's symptoms. Electronically Signed   By: Roanna RaiderJeffery  Chang M.D.   On: 01/05/2015 03:44   Ct Abdomen Pelvis W Contrast  01/05/2015  CLINICAL DATA:  Acute onset of mild back pain and left-sided abdominal pain. Leukocytosis. Current history of sickle cell disease. Initial encounter. EXAM: CT ABDOMEN AND PELVIS WITH CONTRAST TECHNIQUE: Multidetector CT imaging of the abdomen and pelvis was performed using the standard protocol following bolus administration of intravenous contrast. CONTRAST:   100mL OMNIPAQUE IOHEXOL 300 MG/ML  SOLN COMPARISON:  None. FINDINGS: Minimal bibasilar atelectasis is noted. The liver is unremarkable. The spleen is diminutive in appearance. The patient is status post cholecystectomy, with clips noted at the gallbladder fossa. The pancreas and adrenal glands are unremarkable. The kidneys are unremarkable in appearance. There is no evidence of hydronephrosis. No renal or ureteral stones are seen. No perinephric stranding is appreciated. No free fluid is identified. The small bowel is unremarkable in appearance. The stomach is within normal limits. No acute vascular abnormalities are seen. The appendix is normal in caliber, without evidence of appendicitis. The colon is partially filled with stool, and is unremarkable in appearance. The bladder is moderately distended and grossly unremarkable. The prostate remains normal in size. No inguinal lymphadenopathy is seen. No acute osseous abnormalities are identified. H-shaped vertebral bodies reflects the patient's sickle cell disease. IMPRESSION: No acute abnormality seen within the abdomen or pelvis. Electronically Signed   By: Leotis ShamesJeffery  Chang M.D.   On: 01/05/2015 04:45    Scheduled Meds: . enoxaparin (LOVENOX) injection  40 mg Subcutaneous Q24H  . folic acid  1 mg Oral Daily  . HYDROmorphone   Intravenous 6 times per day  . HYDROmorphone  4 mg Oral Q4H  . hydroxyurea  1,000 mg Oral Daily  . ketorolac  30 mg Intravenous 4 times per day  . morphine  30 mg Oral Q12H  . nicotine  14 mg Transdermal Daily  . senna-docusate  1 tablet Oral BID   Continuous Infusions: . dextrose 5 % and 0.45% NaCl 100 mL/hr at 01/24/15 0238    Time spent 35 minutes.

## 2015-01-24 NOTE — ED Notes (Signed)
Attempted to call report to 3W, spoke with Marylu LundJanet.  Was informed RN not available @ present to take report.
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
## 2015-01-25 ENCOUNTER — Ambulatory Visit: Payer: Medicaid - Out of State | Admitting: Family Medicine

## 2015-01-25 DIAGNOSIS — D638 Anemia in other chronic diseases classified elsewhere: Secondary | ICD-10-CM | POA: Insufficient documentation

## 2015-01-25 LAB — CBC WITH DIFFERENTIAL/PLATELET
Basophils Absolute: 0 10*3/uL (ref 0.0–0.1)
Basophils Relative: 0 %
EOS PCT: 5 %
Eosinophils Absolute: 1 10*3/uL — ABNORMAL HIGH (ref 0.0–0.7)
HEMATOCRIT: 18.9 % — AB (ref 39.0–52.0)
HEMOGLOBIN: 6.7 g/dL — AB (ref 13.0–17.0)
LYMPHS ABS: 5.1 10*3/uL — AB (ref 0.7–4.0)
LYMPHS PCT: 26 %
MCH: 33.8 pg (ref 26.0–34.0)
MCHC: 35.4 g/dL (ref 30.0–36.0)
MCV: 95.5 fL (ref 78.0–100.0)
MONOS PCT: 7 %
Monocytes Absolute: 1.4 10*3/uL — ABNORMAL HIGH (ref 0.1–1.0)
Neutro Abs: 12 10*3/uL — ABNORMAL HIGH (ref 1.7–7.7)
Neutrophils Relative %: 62 %
Platelets: 233 10*3/uL (ref 150–400)
RBC: 1.98 MIL/uL — AB (ref 4.22–5.81)
RDW: 22.7 % — AB (ref 11.5–15.5)
WBC: 19.5 10*3/uL — AB (ref 4.0–10.5)

## 2015-01-25 LAB — RETICULOCYTES
RBC.: 2 MIL/uL — AB (ref 4.22–5.81)
Retic Ct Pct: >23 % — ABNORMAL HIGH (ref 0.4–3.1)

## 2015-01-25 LAB — LACTATE DEHYDROGENASE: LDH: 254 U/L — ABNORMAL HIGH (ref 98–192)

## 2015-01-25 NOTE — Care Management Note (Signed)
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               Action/Plan: From home with family  Expected Discharge Date:                  Expected Discharge Plan:  Home/Self Care  In-House Referral:     Discharge planning Services  CM Consult  Post Acute Care Choice:    Choice offered to:     DME Arranged:    DME Agency:     HH Arranged:    HH Agency:     Status of Service:  Completed, signed off  Medicare Important Message Given:    Date Medicare IM Given:    Medicare IM give by:    Date Additional Medicare IM Given:    Additional Medicare Important Message give by:     If discussed at Long Length of Stay Meetings, dates discussed:    Additional Comments:  Bartholome BillCLEMENTS, Trenton Verne H, RN 01/25/2015, 10:45 AM

## 2015-01-26 MED ORDER — HYDROMORPHONE HCL 4 MG PO TABS: 4.0000 mg | ORAL_TABLET | ORAL | Status: DC

## 2015-01-26 MED ORDER — OXYCODONE HCL 15 MG PO TABS: 15.0000 mg | ORAL_TABLET | ORAL | Status: DC | PRN

## 2015-01-26 NOTE — Progress Notes (Signed)
Ambulatory saturation on room air 98%.

## 2015-01-26 NOTE — Progress Notes (Signed)
Patient discharged home, all discharge medications and instructions reviewed and questions answered. Patient to be assisted to vehicle by wheelchair.  

## 2015-01-28 ENCOUNTER — Telehealth: Payer: Self-pay | Admitting: Internal Medicine

## 2015-01-28 NOTE — Telephone Encounter (Signed)
REFILL REQUEST FOR OXYCODONE 15MG . LOV 01/11/2015. Please advise. Thanks!

## 2015-01-30 ENCOUNTER — Other Ambulatory Visit (HOSPITAL_COMMUNITY): Payer: Self-pay | Admitting: Family Medicine

## 2015-01-30 DIAGNOSIS — G8929 Other chronic pain: Secondary | ICD-10-CM

## 2015-01-30 MED ORDER — OXYCODONE HCL 15 MG PO TABS: 15.0000 mg | ORAL_TABLET | ORAL | Status: DC | PRN

## 2015-01-30 NOTE — Telephone Encounter (Signed)
Received prescription request for Oxycodone 15 mg every 4 hours. Will be unable to prescribe medication due to inconsistencies noted on the [place] Substance reporting system. Patient has an appointment scheduled with Dr. Nolene Bernheimlu Jegede, primary provider on 02/12/2015, he can discuss medications further during appointment.   Massie MaroonHollis,Klea Nall M, FNP

## 2015-02-12 ENCOUNTER — Telehealth (HOSPITAL_COMMUNITY): Payer: Self-pay | Admitting: Hematology

## 2015-02-12 ENCOUNTER — Telehealth: Payer: Self-pay | Admitting: Internal Medicine

## 2015-02-12 ENCOUNTER — Ambulatory Visit: Payer: Medicaid - Out of State | Admitting: Internal Medicine

## 2015-02-12 NOTE — Telephone Encounter (Signed)
  Received prescription request for Oxycodone 15 mg every 4 hours. Will be unable to prescribe medication. Inconsistencies noted on [place] Substance reporting system. Patient will need to schedule a follow up appointment with Dr. Hyman HopesJegede.   Massie MaroonHollis,Labarron Durnin M, FNP

## 2015-02-12 NOTE — Telephone Encounter (Signed)
Refill request for oxycodone 15mg . LOV 01/11/2015. Please advise. Thanks!

## 2015-02-12 NOTE — Telephone Encounter (Signed)
Patient C/O pain to back and left side that he rates 6/10 on pain scale.  Patient denies chest pain, shortness of breath, denies difficulty breathing, or abdominal pain.  Patient denies fever and N/V/D.  Patient took last oxycodone 15mg  around 8:30 today. I advised I would notify the provider and give him a call back.

## 2015-02-12 NOTE — Telephone Encounter (Signed)
Spoke with Armeniahina, NP and because patient can't get here before 1:00p.m. He would not be able to receive adequate treatment so he should go to the ED in [place].

## 2015-02-13 ENCOUNTER — Other Ambulatory Visit: Payer: Self-pay | Admitting: Internal Medicine

## 2015-02-13 DIAGNOSIS — D5700 Hb-SS disease with crisis, unspecified: Secondary | ICD-10-CM

## 2015-02-14 MED ORDER — HYDROXYUREA 500 MG PO CAPS: 1000.0000 mg | ORAL_CAPSULE | Freq: Every day | ORAL | Status: DC

## 2015-02-14 NOTE — Telephone Encounter (Signed)
Refill for hydrea has been sent into pharmacy. See [name]'s note regarding oxycodone. Thanks!

## 2015-02-15 ENCOUNTER — Telehealth (HOSPITAL_COMMUNITY): Payer: Self-pay | Admitting: Hematology

## 2015-02-15 NOTE — Telephone Encounter (Signed)
C/O pain to back and side rates pain 7/10 on pain scale. Patient states he doesn't have any pain medication.  Patient denies N/V/D, and chest pain or shortness of breath.  While I was talking to patient, the call dropped mid-call.  I attempted to call patient back and phone keeps ringing to a voicemail.

## 2015-02-26 NOTE — Discharge Summary (Signed)
Matthew Shannon MRN: [id number] DOB/AGE: March 04, 1990 25 y.o.  Admit date: 01/23/2015 Discharge date: 01/26/2015  Primary Care Physician:  Jeanann Lewandowsky, MD   Discharge Diagnoses:   Patient Active Problem List   Diagnosis Date Noted  . Anemia of chronic disease   . Leukocytosis 01/24/2015  . Sickle cell anemia with pain (HCC) 01/09/2015  . Dental infection 11/17/2014  . Chest pain 11/17/2014  . Anemia 10/09/2014  . Sickle cell crisis (HCC) 10/09/2014  . Tachycardia 09/26/2014  . Sickle cell pain crisis (HCC) 09/24/2014  . Community acquired pneumonia 09/24/2014  . Tobacco abuse 09/24/2014  . CAP (community acquired pneumonia) 09/24/2014    DISCHARGE MEDICATION:   Medication List    STOP taking these medications        hydroxyurea 500 MG capsule  Commonly known as:  HYDREA     oxyCODONE 15 MG immediate release tablet  Commonly known as:  ROXICODONE      TAKE these medications        folic acid 1 MG tablet  Commonly known as:  FOLVITE  Take 1 tablet (1 mg total) by mouth daily.     HYDROmorphone 4 MG tablet  Commonly known as:  DILAUDID  Take 1 tablet (4 mg total) by mouth every 4 (four) hours.     ibuprofen 600 MG tablet  Commonly known as:  ADVIL,MOTRIN  Take 1 tablet (600 mg total) by mouth every 8 (eight) hours as needed.     morphine 30 MG 12 hr tablet  Commonly known as:  MS CONTIN  Take 1 tablet (30 mg total) by mouth every 12 (twelve) hours.     nicotine 14 mg/24hr patch  Commonly known as:  NICODERM CQ  Place 1 patch (14 mg total) onto the skin daily.     ondansetron 8 MG tablet  Commonly known as:  ZOFRAN  Take 1 tablet (8 mg total) by mouth every 8 (eight) hours as needed for nausea or vomiting.           No results found for this or any previous visit (from the past 240 hour(s)).  BRIEF ADMITTING H & P: Matthew Shannon is a 25 y.o. male witha past medical history of Sickle cell anemia (HCC).  Patient Has hx of frequent ER visits with  12 admissions. He has frequent episodes of pain. On 12/12 patient was seen in clinic for pain control.  Patient developed abdominal pain and back pain for the past 2 days. He attemepted to take his home PO medications but was unable to control pain. In ER noted to have leukocytosis up to 20, no hx of fever no chest pain. His Hg slightly down from baseline to 7.9 with elevated retic count up to 20% and elevated bilirubin at 4.9  Hospitalist was called for admission for sickle cell crisis with pain   Hospital Course:  Present on Admission:  . Sickle cell pain crisis [facility]): Pain was treated with IV Dilaudid via PCA. He was transitioned to oral Dilaudid as he reported that the Oxycodone was ineffective for treating his pain and had been for some time. The oral dilaudid was ineffective in controlling his pain and he was discharged on Dilaudid 4 mg. Pt was given a prescription for Dilaudid # 30 tabs.  . Chronic Pain: Pt has chronic pain and has been taking Oxycodone 15 mg every 4 hours as needed. He reports that this has been ineffective in managing his pain. I have advised patietn to discuss this  with Provider in the out  Patient setting.  . Tobacco abuse: Counseled against further tobacco use.    Disposition and Follow-up:  Pt discharged home in good condition and is to follow up with his PMD as scheduled.     Discharge Instructions    Activity as tolerated - No restrictions    Complete by:  As directed      Diet general    Complete by:  As directed      Discharge patient    Complete by:  As directed             DISCHARGE EXAM:  General: Alert, awake, oriented x3, in no apparent distress Vital Signs: BP 102/52 mmHg  HR 94  Temp(Src) 98 F (36.7 C) (Oral)  RR 14  Ht  (1.778 m)  Wt 127 lb 3.3 oz (57.7 kg)  BMI 18.25 kg/m2  SpO2 98% on RA  HEENT: [place]/AT PEERL, EOMI, anicteric Neck: Trachea midline, no masses, no thyromegal,y no JVD, no carotid bruit OROPHARYNX: Moist,  No exudate/ erythema/lesions.  Heart: Regular rate and rhythm, without murmurs, rubs, gallops or S3. PMI non-displaced. Exam reveals no decreased pulses. Pulmonary/Chest: Normal effort. Breath sounds normal. No. Apnea. Clear to auscultation,no stridor,  no wheezing and no rhonchi noted. No respiratory distress and no tenderness noted. Abdomen: Soft, nontender, nondistended, normal bowel sounds, no masses no hepatosplenomegaly noted. No fluid wave and no ascites. There is no guarding or rebound. Neuro: Alert and oriented to person, place and time. Normal motor skills, Displays no atrophy or tremors and exhibits normal muscle tone.  No focal neurological deficits noted cranial nerves II through XII grossly intact. No sensory deficit noted.  Strength at baseline in bilateral upper and lower extremities. Gait normal. Musculoskeletal: No warm swelling or erythema around joints, no spinal tenderness noted. Psychiatric: Patient alert and oriented x3, good insight and cognition, good recent to remote recall. Mood, memory, affect and judgement normal Lymph node survey: No cervical axillary or inguinal lymphadenopathy noted. Skin: Skin is warm and dry. No bruising, no ecchymosis and no rash noted. Pt is not diaphoretic. No erythema. No pallor     Blood pressure 102/52, pulse 94, temperature 98 F (36.7 C), temperature source Oral, resp. rate 14, height  (1.778 m), weight 127 lb 3.3 oz (57.7 kg), SpO2 98 %.  No results for input(s): NA, K, CL, CO2, GLUCOSE, BUN, CREATININE, CALCIUM, MG, PHOS in the last 72 hours. No results for input(s): AST, ALT, ALKPHOS, BILITOT, PROT, ALBUMIN in the last 72 hours. No results for input(s): LIPASE, AMYLASE in the last 72 hours. No results for input(s): WBC, NEUTROABS, HGB, HCT, MCV, PLT in the last 72 hours.   Total time spent including face to face and decision making was greater than 30 minutes  Signed: Lawton Dollinger A. 02/26/2015, 1:39 PM

## 2015-02-27 ENCOUNTER — Encounter (HOSPITAL_COMMUNITY): Payer: Self-pay | Admitting: Emergency Medicine

## 2015-02-27 ENCOUNTER — Emergency Department (HOSPITAL_COMMUNITY)
Admission: EM | Admit: 2015-02-27 | Discharge: 2015-02-27 | Discharge status: Against Medical Advice | Payer: Medicaid - Out of State | Attending: Emergency Medicine | Admitting: Emergency Medicine

## 2015-02-27 ENCOUNTER — Emergency Department (HOSPITAL_COMMUNITY): Payer: Medicaid - Out of State

## 2015-02-27 DIAGNOSIS — Z79899 Other long term (current) drug therapy: Secondary | ICD-10-CM | POA: Insufficient documentation

## 2015-02-27 DIAGNOSIS — D5700 Hb-SS disease with crisis, unspecified: Secondary | ICD-10-CM | POA: Diagnosis not present

## 2015-02-27 DIAGNOSIS — F17210 Nicotine dependence, cigarettes, uncomplicated: Secondary | ICD-10-CM | POA: Diagnosis not present

## 2015-02-27 MED ORDER — DIPHENHYDRAMINE HCL 25 MG PO CAPS
25.0000 mg | ORAL_CAPSULE | Freq: Once | ORAL | Status: AC
  Administered 2015-02-27: 25 mg via ORAL
  Filled 2015-02-27: qty 1

## 2015-02-27 MED ORDER — PROMETHAZINE HCL 25 MG/ML IJ SOLN
25.0000 mg | Freq: Once | INTRAMUSCULAR | Status: DC
  Filled 2015-02-27: qty 1

## 2015-02-27 MED ORDER — PROMETHAZINE HCL 25 MG/ML IJ SOLN
25.0000 mg | Freq: Once | INTRAMUSCULAR | Status: AC
  Administered 2015-02-27: 25 mg via INTRAMUSCULAR
  Filled 2015-02-27: qty 1

## 2015-02-27 MED ORDER — KETOROLAC TROMETHAMINE 30 MG/ML IJ SOLN: 30.0000 mg | Freq: Once | INTRAMUSCULAR | Status: DC

## 2015-02-27 MED ORDER — KETOROLAC TROMETHAMINE 60 MG/2ML IM SOLN
30.0000 mg | Freq: Once | INTRAMUSCULAR | Status: DC
  Filled 2015-02-27: qty 2

## 2015-02-27 MED ORDER — HYDROMORPHONE HCL 2 MG/ML IJ SOLN
3.0000 mg | Freq: Once | INTRAMUSCULAR | Status: AC
  Administered 2015-02-27: 3 mg via INTRAMUSCULAR
  Filled 2015-02-27: qty 2

## 2015-02-27 MED ORDER — HYDROMORPHONE HCL 2 MG/ML IJ SOLN: 3.0000 mg | INTRAMUSCULAR | Status: DC

## 2015-02-27 MED ORDER — KCL IN DEXTROSE-NACL 20-5-0.45 MEQ/L-%-% IV SOLN: Freq: Once | INTRAVENOUS | Status: DC

## 2015-02-27 NOTE — Progress Notes (Addendum)
Pt states he has a pain management clinic appt coming up but does not know the day or time or location of this appt to be able to tell ED Cm. States that during his last visit with L Hollis she discussed with him that he may need a pain management clinic but pt confirms she did not provide any referrals to pain management  Cm goggled all [place] pain management clinics, showed them to pt but he did not recognize any of the listed pain management clinics as the one he is possibly scheduled to go see   Pt present with ED CM as she called the below agencies to see if he had an appt  [facility] [place] [facility] [place], [place] [phone] contacted but this office is closed for lunch [facility] Pain Management Physician  [place], [place] [phone] CM left a voice message for a return call to CM mobile  Pt states he has contacted his step father who lives in [place] of who he believes his appt information is in his step father's car Confirmed no appt with [facility] staff members in [place] [phone] [facility] staff member Donnie confirms pt without an appt  [facility] [place], [place]  [phone] office closed for lunch  [facility]: Carrolyn Meiers MD Neurologist [place], [place] [phone] confirmed with Herbert Seta that pt does not have any appt with them  CM discussed with pt the general standard procedures of pain management clinics Discussed with pt that if he is placed on a pain management contract by a pain management doctor that all other doctors will follow that pain management contract without deviations Pt voiced understanding

## 2015-02-27 NOTE — ED Notes (Signed)
Walked in room to reassess pain, pt is not in room, checked restrooms and halls, no where to be found.

## 2015-02-27 NOTE — ED Notes (Addendum)
Per pt, states back pain that started 2 days ago-does not go to sickle cell clinic-states he goes to pain clinic-patient informed that if he is under a pain management contract he might not receive narcotics in ED-Kim, case manager, has discussed follow-up care with patient

## 2015-02-27 NOTE — Progress Notes (Addendum)
25 yr old medicaid [place] covered pt with [facility] [place] address with [facility] ED visits and 13 [facility] admissions Last admission at [facility] 01/23/15-01/26/15  Pt has an upcoming appt with Dr Hyman Hopes at [facility] cell clinic on 03/05/15 at 1000  Pt called Sickle cell clinic NP on 02/12/15 for refill of oxycodone  This note for 02/12/15 entered in [place] Received prescription request for Oxycodone 15 mg every 4 hours. Will be unable to prescribe medication. Inconsistencies noted on [place] Substance reporting system. Patient will need to schedule a follow up appointment with Dr. Watt Climes in d/c instructions [facility] Go on 03/05/2015 You have a scheduled appointment with Dr Hyman Hopes at the sickle cell clinic on 03/05/15 at 1000 [address] [place] [address] [phone]

## 2015-02-27 NOTE — Progress Notes (Signed)
Cm called [facility] [place] [facility] [place], [place] [phone] after their closed lunch hours. left voice message for return call to cm mobile

## 2015-02-28 ENCOUNTER — Telehealth: Payer: Self-pay | Admitting: *Deleted
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
## 2015-03-05 ENCOUNTER — Encounter: Payer: Self-pay | Admitting: Internal Medicine

## 2015-03-05 ENCOUNTER — Ambulatory Visit (INDEPENDENT_AMBULATORY_CARE_PROVIDER_SITE_OTHER): Payer: Medicaid - Out of State | Admitting: Internal Medicine

## 2015-03-05 VITALS — BP 108/60 | HR 80 | Temp 98.2°F | Resp 18 | Ht 70.0 in | Wt 127.0 lb

## 2015-03-05 DIAGNOSIS — D5700 Hb-SS disease with crisis, unspecified: Secondary | ICD-10-CM | POA: Diagnosis not present

## 2015-03-05 DIAGNOSIS — Z720 Tobacco use: Secondary | ICD-10-CM | POA: Diagnosis not present

## 2015-03-05 MED ORDER — OXYCODONE HCL 15 MG PO TABS: 15.0000 mg | ORAL_TABLET | ORAL | Status: DC | PRN

## 2015-03-05 MED ORDER — MORPHINE SULFATE ER 30 MG PO TBCR: 30.0000 mg | EXTENDED_RELEASE_TABLET | Freq: Two times a day (BID) | ORAL | Status: DC

## 2015-03-05 NOTE — Patient Instructions (Signed)
Sickle Cell Anemia, Adult Sickle cell anemia is a condition in which red blood cells have an abnormal "sickle" shape. This abnormal shape shortens the cells' life span, which results in a lower than normal concentration of red blood cells in the blood. The sickle shape also causes the cells to clump together and block free blood flow through the blood vessels. As a result, the tissues and organs of the body do not receive enough oxygen. Sickle cell anemia causes organ damage and pain and increases the risk of infection. CAUSES  Sickle cell anemia is a genetic disorder. Those who receive two copies of the gene have the condition, and those who receive one copy have the trait. RISK FACTORS The sickle cell gene is most common in people whose families originated in [place]. Other areas of the globe where sickle cell trait occurs include the Mediterranean, [place] and [place], the [place], and the [place].  SIGNS AND SYMPTOMS  Pain, especially in the extremities, back, chest, or abdomen (common). The pain may start suddenly or may develop following an illness, especially if there is dehydration. Pain can also occur due to overexertion or exposure to extreme temperature changes.  Frequent severe bacterial infections, especially certain types of pneumonia and meningitis.  Pain and swelling in the hands and feet.  Decreased activity.   Loss of appetite.   Change in behavior.  Headaches.  Seizures.  Shortness of breath or difficulty breathing.  Vision changes.  Skin ulcers. Those with the trait may not have symptoms or they may have mild symptoms.  DIAGNOSIS  Sickle cell anemia is diagnosed with blood tests that demonstrate the genetic trait. It is often diagnosed during the newborn period, due to mandatory testing nationwide. A variety of blood tests, X-rays, CT scans, MRI scans, ultrasounds, and lung function tests may also be done to monitor the condition. TREATMENT  Sickle  cell anemia may be treated with:  Medicines. You may be given pain medicines, antibiotic medicines (to treat and prevent infections) or medicines to increase the production of certain types of hemoglobin.  Fluids.  Oxygen.  Blood transfusions. HOME CARE INSTRUCTIONS   Drink enough fluid to keep your urine clear or pale yellow. Increase your fluid intake in hot weather and during exercise.  Do not smoke. Smoking lowers oxygen levels in the blood.   Only take over-the-counter or prescription medicines for pain, fever, or discomfort as directed by your health care provider.  Take antibiotics as directed by your health care provider. Make sure you finish them it even if you start to feel better.   Take supplements as directed by your health care provider.   Consider wearing a medical alert bracelet. This tells anyone caring for you in an emergency of your condition.   When traveling, keep your medical information, health care provider's names, and the medicines you take with you at all times.   If you develop a fever, do not take medicines to reduce the fever right away. This could cover up a problem that is developing. Notify your health care provider.  Keep all follow-up appointments with your health care provider. Sickle cell anemia requires regular medical care. SEEK MEDICAL CARE IF: You have a fever. SEEK IMMEDIATE MEDICAL CARE IF:   You feel dizzy or faint.   You have new abdominal pain, especially on the left side near the stomach area.   You develop a persistent, often uncomfortable and painful penile erection (priapism). If this is not treated immediately it   will lead to impotence.   You have numbness your arms or legs or you have a hard time moving them.   You have a hard time with speech.   You have a fever or persistent symptoms for more than 2-3 days.   You have a fever and your symptoms suddenly get worse.   You have signs or symptoms of infection.  These include:   Chills.   Abnormal tiredness (lethargy).   Irritability.   Poor eating.   Vomiting.   You develop pain that is not helped with medicine.   You develop shortness of breath.  You have pain in your chest.   You are coughing up pus-like or bloody sputum.   You develop a stiff neck.  Your feet or hands swell or have pain.  Your abdomen appears bloated.  You develop joint pain. MAKE SURE YOU:  Understand these instructions.   This information is not intended to replace advice given to you by your health care provider. Make sure you discuss any questions you have with your health care provider.   Document Released: 05/06/2005 Document Revised: 02/16/2014 Document Reviewed: 09/07/2012 Elsevier Interactive Patient Education 2016 [employer]  

## 2015-03-05 NOTE — Progress Notes (Signed)
Patient ID: Matthew Shannon, male   DOB: 03-10-1990, 25 y.o.   MRN: [id number]   Matthew Shannon, is a 25 y.o. male  [id number]  [id number]  DOB - 03-27-1990  Chief Complaint  Patient presents with  . Follow-up    SCD        Subjective:   Matthew Shannon is a 25 y.o. male with history of sickle cell anemia and chronic pain syndrome here today for a follow up visit. Patient was last seen in the ED 1 week ago with major complaints of pain especially in the low back area. He has a history of ED over utilization for acute on chronic pain. Today he has no new complaint. He claims his pain is at baseline. He needs a refill on his pain medications. He denies any fever, no chest pain, no abdominal pain, no nausea, no vomiting, no diarrhea, no constipation. He denies any urinary symptoms. Patient has No headache, No new weakness tingling or numbness, No Cough - SOB. He is up-to-date with his immunizations.  No problems updated.  ALLERGIES: No Known Allergies  PAST MEDICAL HISTORY: Past Medical History  Diagnosis Date  . Sickle cell anemia (HCC)     MEDICATIONS AT HOME: Prior to Admission medications   Medication Sig Start Date End Date Taking? Authorizing Provider  folic acid (FOLVITE) 1 MG tablet Take 1 tablet (1 mg total) by mouth daily. 10/16/14  Yes [name], MD  hydroxyurea (HYDREA) 500 MG capsule Take 2 capsules (1,000 mg total) by mouth daily. May take with food to minimize GI side effects. 02/14/15  Yes Massie Maroon, FNP  ibuprofen (ADVIL,MOTRIN) 600 MG tablet Take 1 tablet (600 mg total) by mouth every 8 (eight) hours as needed. 12/17/14  Yes Massie Maroon, FNP  HYDROmorphone (DILAUDID) 4 MG tablet Take 1 tablet (4 mg total) by mouth every 4 (four) hours. Patient not taking: Reported on 03/05/2015 01/26/15   Altha Harm, MD  morphine (MS CONTIN) 30 MG 12 hr tablet Take 1 tablet (30 mg total) by mouth every 12 (twelve) hours. 03/05/15   [name], MD   nicotine (NICODERM CQ) 14 mg/24hr patch Place 1 patch (14 mg total) onto the skin daily. Patient not taking: Reported on 03/05/2015 12/17/14   Massie Maroon, FNP  ondansetron (ZOFRAN) 8 MG tablet Take 1 tablet (8 mg total) by mouth every 8 (eight) hours as needed for nausea or vomiting. Patient not taking: Reported on 03/05/2015 10/24/14   Mercedes Camprubi-Soms, PA-C  oxyCODONE (ROXICODONE) 15 MG immediate release tablet Take 1 tablet (15 mg total) by mouth every 4 (four) hours as needed. 03/05/15   [name], MD     Objective:   Filed Vitals:   03/05/15 1240  BP: 108/60  Pulse: 80  Temp: 98.2 F (36.8 C)  TempSrc: Oral  Resp: 18  Height:  (1.778 m)  Weight: 127 lb (57.607 kg)  SpO2: 100%    Exam General appearance : Awake, alert, not in any distress. Speech Clear. Not toxic looking HEENT: Atraumatic and Normocephalic, pupils equally reactive to light and accomodation Neck: supple, no JVD. No cervical lymphadenopathy.  Chest:Good air entry bilaterally, no added sounds  CVS: S1 S2 regular, no murmurs.  Abdomen: Bowel sounds present, Non tender and not distended with no gaurding, rigidity or rebound. Extremities: B/L Lower Ext shows no edema, both legs are warm to touch Neurology: Awake alert, and oriented X 3, CN II-XII intact, Non focal Skin:No  Rash  Data Review No results found for: HGBA1C   Assessment & Plan   1. Tobacco abuse  Matthew Shannon was counseled on the dangers of tobacco use, and was advised to quit. Reviewed strategies to maximize success, including removing cigarettes and smoking materials from environment, stress management and support of family/friends.   2. Sickle cell anemia with pain (HCC) Refilled - oxyCODONE (ROXICODONE) 15 MG immediate release tablet; Take 1 tablet (15 mg total) by mouth every 4 (four) hours as needed.  Dispense: 60 tablet; Refill: 0 - morphine (MS CONTIN) 30 MG 12 hr tablet; Take 1 tablet (30 mg total) by mouth every 12  (twelve) hours.  Dispense: 60 tablet; Refill: 0  Continue Hydrea 1000 mg capsule by mouth daily. We discussed the need for good hydration, monitoring of hydration status, avoidance of heat, cold, stress, and infection triggers. We discussed the risks and benefits of Hydrea, including bone marrow suppression, the possibility of GI upset, skin ulcers, hair thinning, and teratogenicity. The patient was reminded of the need to seek medical attention of any symptoms of bleeding, anemia, or infection. Continue folic acid 1 mg daily to prevent aplastic bone marrow crises.   Chronic painful episodes - We agreed on Opiate dose and amount of pills  per month. We discussed that pt is to receive Schedule II prescriptions only from our clinic. Pt is also aware that the prescription history is available to [place] online through the [facility] CSRS. Controlled substance agreement reviewed and signed. We reminded Stavros that all patients receiving Schedule II narcotics must be seen for follow within one month of prescription being requested. We reviewed the terms of our pain agreement, including the need to keep medicines in a safe locked location away from children or pets, and the need to report excess sedation or constipation, measures to avoid constipation, and policies related to early refills and stolen prescriptions. According to the [place] [facility] program, we have reviewed details related to analgesia, adverse effects and aberrant behaviors.  Patient have been counseled extensively about nutrition and exercise  Return in about 4 weeks (around 04/02/2015) for Sickle Cell Disease/Pain.  The patient was given clear instructions to go to ER or return to medical center if symptoms don't improve, worsen or new problems develop. The patient verbalized understanding. The patient was told to call to get lab results if they haven't heard anything in the next week.   This note has been created with Furniture conservator/restorer. Any transcriptional errors are unintentional.    Jeanann Lewandowsky, MD, MHA, FACP, FAAP, CPE [facility] [place], [place] [phone]   03/05/2015, 1:00 PM

## 2015-03-05 NOTE — Progress Notes (Signed)
Patient here for FU SCD  Patient denies pain at this time.  Patient is out of pain medications except ibuprophen

## 2015-03-11 ENCOUNTER — Encounter (HOSPITAL_COMMUNITY): Payer: Self-pay | Admitting: Emergency Medicine

## 2015-03-11 ENCOUNTER — Emergency Department (HOSPITAL_COMMUNITY)
Admission: EM | Admit: 2015-03-11 | Discharge: 2015-03-11 | Disposition: A | Discharge status: Home / Self Care | Payer: Medicaid - Out of State | Attending: Emergency Medicine | Admitting: Emergency Medicine

## 2015-03-11 DIAGNOSIS — G8929 Other chronic pain: Secondary | ICD-10-CM | POA: Diagnosis not present

## 2015-03-11 DIAGNOSIS — D5700 Hb-SS disease with crisis, unspecified: Secondary | ICD-10-CM | POA: Diagnosis not present

## 2015-03-11 DIAGNOSIS — Z720 Tobacco use: Secondary | ICD-10-CM

## 2015-03-11 DIAGNOSIS — R17 Unspecified jaundice: Secondary | ICD-10-CM | POA: Diagnosis not present

## 2015-03-11 DIAGNOSIS — Z79891 Long term (current) use of opiate analgesic: Secondary | ICD-10-CM | POA: Insufficient documentation

## 2015-03-11 DIAGNOSIS — Z79899 Other long term (current) drug therapy: Secondary | ICD-10-CM | POA: Insufficient documentation

## 2015-03-11 DIAGNOSIS — D72829 Elevated white blood cell count, unspecified: Secondary | ICD-10-CM | POA: Insufficient documentation

## 2015-03-11 DIAGNOSIS — F17210 Nicotine dependence, cigarettes, uncomplicated: Secondary | ICD-10-CM | POA: Insufficient documentation

## 2015-03-11 DIAGNOSIS — M549 Dorsalgia, unspecified: Secondary | ICD-10-CM

## 2015-03-11 LAB — COMPREHENSIVE METABOLIC PANEL
ALBUMIN: 5 g/dL (ref 3.5–5.0)
ALT: 32 U/L (ref 17–63)
ANION GAP: 12 (ref 5–15)
AST: 50 U/L — ABNORMAL HIGH (ref 15–41)
Alkaline Phosphatase: 116 U/L (ref 38–126)
BUN: 8 mg/dL (ref 6–20)
CO2: 25 mmol/L (ref 22–32)
Calcium: 9.9 mg/dL (ref 8.9–10.3)
Chloride: 102 mmol/L (ref 101–111)
Creatinine, Ser: 0.41 mg/dL — ABNORMAL LOW (ref 0.61–1.24)
GFR calc Af Amer: >60 mL/min (ref >60)
GFR calc non Af Amer: >60 mL/min (ref >60)
GLUCOSE: 77 mg/dL (ref 65–99)
POTASSIUM: 4.7 mmol/L (ref 3.5–5.1)
SODIUM: 139 mmol/L (ref 135–145)
Total Bilirubin: 4.3 mg/dL — ABNORMAL HIGH (ref 0.3–1.2)
Total Protein: 9.3 g/dL — ABNORMAL HIGH (ref 6.5–8.1)

## 2015-03-11 LAB — CBC WITH DIFFERENTIAL/PLATELET
BASOS ABS: 0.1 10*3/uL (ref 0.0–0.1)
Basophils Relative: 0 %
Eosinophils Absolute: 0.6 10*3/uL (ref 0.0–0.7)
Eosinophils Relative: 3 %
HEMATOCRIT: 28.8 % — AB (ref 39.0–52.0)
Hemoglobin: 10.1 g/dL — ABNORMAL LOW (ref 13.0–17.0)
LYMPHS ABS: 3.5 10*3/uL (ref 0.7–4.0)
LYMPHS PCT: 20 %
MCH: 32.7 pg (ref 26.0–34.0)
MCHC: 35.1 g/dL (ref 30.0–36.0)
MCV: 93.2 fL (ref 78.0–100.0)
MONO ABS: 2 10*3/uL — AB (ref 0.1–1.0)
MONOS PCT: 12 %
NEUTROS ABS: 11.1 10*3/uL — AB (ref 1.7–7.7)
Neutrophils Relative %: 65 %
Platelets: 303 10*3/uL (ref 150–400)
RBC: 3.09 MIL/uL — ABNORMAL LOW (ref 4.22–5.81)
RDW: 16.6 % — AB (ref 11.5–15.5)
WBC: 17.4 10*3/uL — ABNORMAL HIGH (ref 4.0–10.5)

## 2015-03-11 MED ORDER — DIPHENHYDRAMINE HCL 25 MG PO CAPS
25.0000 mg | ORAL_CAPSULE | Freq: Once | ORAL | Status: AC
  Administered 2015-03-11: 25 mg via ORAL
  Filled 2015-03-11: qty 1

## 2015-03-11 MED ORDER — OXYCODONE HCL 5 MG PO TABS
5.0000 mg | ORAL_TABLET | Freq: Once | ORAL | Status: AC
  Administered 2015-03-11: 5 mg via ORAL
  Filled 2015-03-11: qty 1

## 2015-03-11 MED ORDER — DEXTROSE-NACL 5-0.45 % IV SOLN
INTRAVENOUS | Status: DC
  Administered 2015-03-11: 21:00:00 via INTRAVENOUS

## 2015-03-11 MED ORDER — KETOROLAC TROMETHAMINE 30 MG/ML IJ SOLN
30.0000 mg | Freq: Once | INTRAMUSCULAR | Status: AC
  Administered 2015-03-11: 30 mg via INTRAVENOUS
  Filled 2015-03-11: qty 1

## 2015-03-11 MED ORDER — PROMETHAZINE HCL 25 MG/ML IJ SOLN
25.0000 mg | Freq: Once | INTRAMUSCULAR | Status: AC
  Administered 2015-03-11: 25 mg via INTRAVENOUS
  Filled 2015-03-11: qty 1

## 2015-03-11 MED ORDER — HYDROMORPHONE HCL 2 MG/ML IJ SOLN
3.0000 mg | INTRAMUSCULAR | Status: DC | PRN
  Administered 2015-03-11 (×2): 3 mg via INTRAVENOUS
  Filled 2015-03-11 (×2): qty 2

## 2015-03-11 NOTE — Discharge Instructions (Signed)
Take your home pain medications as directed by your regular doctor. Stay very well hydrated with plenty of water. Stop smoking! Follow up with your regular doctor in the next 1-2 days. Follow up with the [facility] tomorrow for ongoing management of your chronic pain. Take all of your other regular home medications as directed. Return to the ER for changes or worsening symptoms.   Chronic Back Pain  When back pain lasts longer than 3 months, it is called chronic back pain.People with chronic back pain often go through certain periods that are more intense (flare-ups).  CAUSES Chronic back pain can be caused by wear and tear (degeneration) on different structures in your back. These structures include:  The bones of your spine (vertebrae) and the joints surrounding your spinal cord and nerve roots (facets).  The strong, fibrous tissues that connect your vertebrae (ligaments). Degeneration of these structures may result in pressure on your nerves. This can lead to constant pain. HOME CARE INSTRUCTIONS  Avoid bending, heavy lifting, prolonged sitting, and activities which make the problem worse.  Take brief periods of rest throughout the day to reduce your pain. Lying down or standing usually is better than sitting while you are resting.  Take over-the-counter or prescription medicines only as directed by your caregiver. SEEK IMMEDIATE MEDICAL CARE IF:   You have weakness or numbness in one of your legs or feet.  You have trouble controlling your bladder or bowels.  You have nausea, vomiting, abdominal pain, shortness of breath, or fainting.   This information is not intended to replace advice given to you by your health care provider. Make sure you discuss any questions you have with your health care provider.   Document Released: 03/05/2004 Document Revised: 04/20/2011 Document Reviewed: 07/16/2014 Elsevier Interactive Patient Education 2016 [employer] Inc[employer]  Chronic  Pain Chronic pain can be defined as pain that is off and on and lasts for 3-6 months or longer. Many things cause chronic pain, which can make it difficult to make a diagnosis. There are many treatment options available for chronic pain. However, finding a treatment that works well for you may require trying various approaches until the right one is found. Many people benefit from a combination of two or more types of treatment to control their pain. SYMPTOMS  Chronic pain can occur anywhere in the body and can range from mild to very severe. Some types of chronic pain include:  Headache.  Low back pain.  Cancer pain.  Arthritis pain.  Neurogenic pain. This is pain resulting from damage to nerves. People with chronic pain may also have other symptoms such as:  Depression.  Anger.  Insomnia.  Anxiety. DIAGNOSIS  Your health care provider will help diagnose your condition over time. In many cases, the initial focus will be on excluding possible conditions that could be causing the pain. Depending on your symptoms, your health care provider may order tests to diagnose your condition. Some of these tests may include:   Blood tests.   CT scan.   MRI.   X-rays.   Ultrasounds.   Nerve conduction studies.  You may need to see a specialist.  TREATMENT  Finding treatment that works well may take time. You may be referred to a pain specialist. He or she may prescribe medicine or therapies, such as:   Mindful meditation or yoga.  Shots (injections) of numbing or pain-relieving medicines into the spine or area of pain.  Local electrical stimulation.  Acupuncture.  Massage therapy.   Aroma, color, light, or sound therapy.   Biofeedback.   Working with a physical therapist to keep from getting stiff.   Regular, gentle exercise.   Cognitive or behavioral therapy.   Group support.  Sometimes, surgery may be recommended.  HOME CARE INSTRUCTIONS   Take  all medicines as directed by your health care provider.   Lessen stress in your life by relaxing and doing things such as listening to calming music.   Exercise or be active as directed by your health care provider.   Eat a healthy diet and include things such as vegetables, fruits, fish, and lean meats in your diet.   Keep all follow-up appointments with your health care provider.   Attend a support group with others suffering from chronic pain. SEEK MEDICAL CARE IF:   Your pain gets worse.   You develop a new pain that was not there before.   You cannot tolerate medicines given to you by your health care provider.   You have new symptoms since your last visit with your health care provider.  SEEK IMMEDIATE MEDICAL CARE IF:   You feel weak.   You have decreased sensation or numbness.   You lose control of bowel or bladder function.   Your pain suddenly gets much worse.   You develop shaking.  You develop chills.  You develop confusion.  You develop chest pain.  You develop shortness of breath.  MAKE SURE YOU:  Understand these instructions.  Will watch your condition.  Will get help right away if you are not doing well or get worse.   This information is not intended to replace advice given to you by your health care provider. Make sure you discuss any questions you have with your health care provider.   Document Released: 10/18/2001 Document Revised: 09/28/2012 Document Reviewed: 07/22/2012 Elsevier Interactive Patient Education 2016 [employer] Inc[employer]  Musculoskeletal Pain Musculoskeletal pain is muscle and boney aches and pains. These pains can occur in any part of the body. Your caregiver may treat you without knowing the cause of the pain. They may treat you if blood or urine tests, X-rays, and other tests were normal.  CAUSES There is often not a definite cause or reason for these pains. These pains may be caused by a type of germ (virus). The  discomfort may also come from overuse. Overuse includes working out too hard when your body is not fit. Boney aches also come from weather changes. Bone is sensitive to atmospheric pressure changes. HOME CARE INSTRUCTIONS   Ask when your test results will be ready. Make sure you get your test results.  Only take over-the-counter or prescription medicines for pain, discomfort, or fever as directed by your caregiver. If you were given medications for your condition, do not drive, operate machinery or power tools, or sign legal documents for 24 hours. Do not drink alcohol. Do not take sleeping pills or other medications that may interfere with treatment.  Continue all activities unless the activities cause more pain. When the pain lessens, slowly resume normal activities. Gradually increase the intensity and duration of the activities or exercise.  During periods of severe pain, bed rest may be helpful. Lay or sit in any position that is comfortable.  Putting ice on the injured area.  Put ice in a bag.  Place a towel between your skin and the bag.  Leave the ice on for 15 to 20 minutes, 3 to 4 times a day.  Follow up with your caregiver for continued problems and no reason can be found for the pain. If the pain becomes worse or does not go away, it may be necessary to repeat tests or do additional testing. Your caregiver may need to look further for a possible cause. SEEK IMMEDIATE MEDICAL CARE IF:  You have pain that is getting worse and is not relieved by medications.  You develop chest pain that is associated with shortness or breath, sweating, feeling sick to your stomach (nauseous), or throw up (vomit).  Your pain becomes localized to the abdomen.  You develop any new symptoms that seem different or that concern you. MAKE SURE YOU:   Understand these instructions.  Will watch your condition.  Will get help right away if you are not doing well or get worse.   This information is  not intended to replace advice given to you by your health care provider. Make sure you discuss any questions you have with your health care provider.   Document Released: 01/26/2005 Document Revised: 04/20/2011 Document Reviewed: 09/30/2012 Elsevier Interactive Patient Education 2016 [employer] Inc[employer]  Pain Medicine Instructions HOW CAN PAIN MEDICINE AFFECT ME? You were prescribed pain medicine. This medicine may:  Make you tired or sleepy.  Affect how well you can:  Drive  Do certain activities. Pain medicine may not make all of your pain go away. You should be comfortable enough to:  Move.  Breathe.  Take care of yourself. HOW OFTEN SHOULD I TAKE PAIN MEDICINE AND HOW MUCH SHOULD I TAKE?  Take pain medicine only as told by your doctor and only as needed for pain.  You do not need to take pain medicine if you are not having pain, unless your doctor tells you to do that.  You can take less than the prescribed dose if you find that less medicine helps your pain. WHAT SHOULD I AVOID WHILE I AM TAKING PAIN MEDICINE? Follow these instructions after you start taking pain medicine, while you are taking the medicine, and for 8 hours after you stop taking the medicine:  Do not drive.  Do not use machinery.  Do not use power tools.  Do not sign legal documents.  Do not drink alcohol.  Do not take sleeping pills.  Do not take care of children by yourself.  Do not do any activities that involve climbing or being in high places.  Do not go into any body of water unless there is an adult nearby who can watch and help you. This includes:  Lakes.  Rivers.  Oceans.  Spas.  Swimming pools. HOW CAN I KEEP OTHERS SAFE WHILE I AM TAKING PAIN MEDICINE?  Store your pain medicine as told by your doctor. Make sure that you keep it where children and pets cannot reach it.  Do not share your pain medicine with anyone.  Do not save any leftover pills. If you have any leftover  pain medicine, get rid of it or destroy it as told by your doctor. WHAT ELSE DO I NEED TO KNOW ABOUT TAKING PAIN MEDICINE?  Use a poop (stool) softener if you have trouble pooping (constipation) because of your pain medicine. Eating more fruits and vegetables also helps with constipation.  Write down the times when you take your pain medicine. Look at the times before you take your next dose of medicine.  If your pain is very bad, do not take more pills than told by your doctor. Call your doctor for help.  Your pain medicine might have acetaminophen in it. Do not take any other acetaminophen while you are taking this medicine. An overdose of acetaminophen can do very bad damage to your liver. If you are taking any medicines in addition to your pain medicine, check the active ingredients on those medicines to see if acetaminophen is listed. WHEN SHOULD I CALL MY DOCTOR?  Your medicine is not helping the pain.  You do either of these soon after you take the medicine:  Throw up (vomit).  Have watery poop (diarrhea).  You have new pain in areas that did not hurt before.  You have an allergic reaction to your medicine. This may include:  Feeling itchy.  Swelling.  Feeling dizzy.  Getting a new rash. WHEN SHOULD I CALL 911 OR GO TO THE EMERGENCY ROOM?  You feel dizzy or you faint.  You feel very confused.  You throw up again and again.  Your skin or lips turn pale or bluish in color.  You are:  Short of breath.  Breathing much more slowly than usual.  You have a very bad allergic reaction to your medicine. This includes:  Developing a swollen tongue.  Having trouble breathing.   This information is not intended to replace advice given to you by your health care provider. Make sure you discuss any questions you have with your health care provider.   Document Released: 07/15/2007 Document Revised: 06/12/2014 Document Reviewed: 11/30/2013 Elsevier Interactive Patient  Education 2016 Else[employer] Inc.  Sickle Cell Anemia, Adult Sickle cell anemia is a condition in which red blood cells have an abnormal "sickle" shape. This abnormal shape shortens the cells' life span, which results in a lower than normal concentration of red blood cells in the blood. The sickle shape also causes the cells to clump together and block free blood flow through the blood vessels. As a result, the tissues and organs of the body do not receive enough oxygen. Sickle cell anemia causes organ damage and pain and increases the risk of infection. CAUSES  Sickle cell anemia is a genetic disorder. Those who receive two copies of the gene have the condition, and those who receive one copy have the trait. RISK FACTORS The sickle cell gene is most common in people whose families originated in [place]. Other areas of the globe where sickle cell trait occurs include the [place], [place], the [place], and the [place].  SIGNS AND SYMPTOMS  Pain, especially in the extremities, back, chest, or abdomen (common). The pain may start suddenly or may develop following an illness, especially if there is dehydration. Pain can also occur due to overexertion or exposure to extreme temperature changes.  Frequent severe bacterial infections, especially certain types of pneumonia and meningitis.  Pain and swelling in the hands and feet.  Decreased activity.   Loss of appetite.   Change in behavior.  Headaches.  Seizures.  Shortness of breath or difficulty breathing.  Vision changes.  Skin ulcers. Those with the trait may not have symptoms or they may have mild symptoms.  DIAGNOSIS  Sickle cell anemia is diagnosed with blood tests that demonstrate the genetic trait. It is often diagnosed during the newborn period, due to mandatory testing nationwide. A variety of blood tests, X-rays, CT scans, MRI scans, ultrasounds, and lung function tests may also be done to monitor the  condition. TREATMENT  Sickle cell anemia may be treated with:  Medicines. You may be given pain medicines, antibiotic medicines (to treat and  prevent infections) or medicines to increase the production of certain types of hemoglobin.  Fluids.  Oxygen.  Blood transfusions. HOME CARE INSTRUCTIONS   Drink enough fluid to keep your urine clear or pale yellow. Increase your fluid intake in hot weather and during exercise.  Do not smoke. Smoking lowers oxygen levels in the blood.   Only take over-the-counter or prescription medicines for pain, fever, or discomfort as directed by your health care provider.  Take antibiotics as directed by your health care provider. Make sure you finish them it even if you start to feel better.   Take supplements as directed by your health care provider.   Consider wearing a medical alert bracelet. This tells anyone caring for you in an emergency of your condition.   When traveling, keep your medical information, health care provider's names, and the medicines you take with you at all times.   If you develop a fever, do not take medicines to reduce the fever right away. This could cover up a problem that is developing. Notify your health care provider.  Keep all follow-up appointments with your health care provider. Sickle cell anemia requires regular medical care. SEEK MEDICAL CARE IF: You have a fever. SEEK IMMEDIATE MEDICAL CARE IF:   You feel dizzy or faint.   You have new abdominal pain, especially on the left side near the stomach area.   You develop a persistent, often uncomfortable and painful penile erection (priapism). If this is not treated immediately it will lead to impotence.   You have numbness your arms or legs or you have a hard time moving them.   You have a hard time with speech.   You have a fever or persistent symptoms for more than 2-3 days.   You have a fever and your symptoms suddenly get worse.   You have  signs or symptoms of infection. These include:   Chills.   Abnormal tiredness (lethargy).   Irritability.   Poor eating.   Vomiting.   You develop pain that is not helped with medicine.   You develop shortness of breath.  You have pain in your chest.   You are coughing up pus-like or bloody sputum.   You develop a stiff neck.  Your feet or hands swell or have pain.  Your abdomen appears bloated.  You develop joint pain. MAKE SURE YOU:  Understand these instructions.   This information is not intended to replace advice given to you by your health care provider. Make sure you discuss any questions you have with your health care provider.   Document Released: 05/06/2005 Document Revised: 02/16/2014 Document Reviewed: 09/07/2012 Elsevier Interactive Patient Education 2016 [employer].  Smoking Cessation, Tips for Success If you are ready to quit smoking, congratulations! You have chosen to help yourself be healthier. Cigarettes bring nicotine, tar, carbon monoxide, and other irritants into your body. Your lungs, heart, and blood vessels will be able to work better without these poisons. There are many different ways to quit smoking. Nicotine gum, nicotine patches, a nicotine inhaler, or nicotine nasal spray can help with physical craving. Hypnosis, support groups, and medicines help break the habit of smoking. WHAT THINGS CAN I DO TO MAKE QUITTING EASIER?  Here are some tips to help you quit for good:  Pick a date when you will quit smoking completely. Tell all of your friends and family about your plan to quit on that date.  Do not try to slowly cut down on the number  of cigarettes you are smoking. Pick a quit date and quit smoking completely starting on that day.  Throw away all cigarettes.   Clean and remove all ashtrays from your home, work, and car.  On a card, write down your reasons for quitting. Carry the card with you and read it when you get the urge  to smoke.  Cleanse your body of nicotine. Drink enough water and fluids to keep your urine clear or pale yellow. Do this after quitting to flush the nicotine from your body.  Learn to predict your moods. Do not let a bad situation be your excuse to have a cigarette. Some situations in your life might tempt you into wanting a cigarette.  Never have "just one" cigarette. It leads to wanting another and another. Remind yourself of your decision to quit.  Change habits associated with smoking. If you smoked while driving or when feeling stressed, try other activities to replace smoking. Stand up when drinking your coffee. Brush your teeth after eating. Sit in a different chair when you read the paper. Avoid alcohol while trying to quit, and try to drink fewer caffeinated beverages. Alcohol and caffeine may urge you to smoke.  Avoid foods and drinks that can trigger a desire to smoke, such as sugary or spicy foods and alcohol.  Ask people who smoke not to smoke around you.  Have something planned to do right after eating or having a cup of coffee. For example, plan to take a walk or exercise.  Try a relaxation exercise to calm you down and decrease your stress. Remember, you may be tense and nervous for the first 2 weeks after you quit, but this will pass.  Find new activities to keep your hands busy. Play with a pen, coin, or rubber band. Doodle or draw things on paper.  Brush your teeth right after eating. This will help cut down on the craving for the taste of tobacco after meals. You can also try mouthwash.   Use oral substitutes in place of cigarettes. Try using lemon drops, carrots, cinnamon sticks, or chewing gum. Keep them handy so they are available when you have the urge to smoke.  When you have the urge to smoke, try deep breathing.  Designate your home as a nonsmoking area.  If you are a heavy smoker, ask your health care provider about a prescription for nicotine chewing gum. It  can ease your withdrawal from nicotine.  Reward yourself. Set aside the cigarette money you save and buy yourself something nice.  Look for support from others. Join a support group or smoking cessation program. Ask someone at home or at work to help you with your plan to quit smoking.  Always ask yourself, "Do I need this cigarette or is this just a reflex?" Tell yourself, "Today, I choose not to smoke," or "I do not want to smoke." You are reminding yourself of your decision to quit.  Do not replace cigarette smoking with electronic cigarettes (commonly called e-cigarettes). The safety of e-cigarettes is unknown, and some may contain harmful chemicals.  If you relapse, do not give up! Plan ahead and think about what you will do the next time you get the urge to smoke. HOW WILL I FEEL WHEN I QUIT SMOKING? You may have symptoms of withdrawal because your body is used to nicotine (the addictive substance in cigarettes). You may crave cigarettes, be irritable, feel very hungry, cough often, get headaches, or have difficulty concentrating. The withdrawal symptoms  are only temporary. They are strongest when you first quit but will go away within 10-14 days. When withdrawal symptoms occur, stay in control. Think about your reasons for quitting. Remind yourself that these are signs that your body is healing and getting used to being without cigarettes. Remember that withdrawal symptoms are easier to treat than the major diseases that smoking can cause.  Even after the withdrawal is over, expect periodic urges to smoke. However, these cravings are generally short lived and will go away whether you smoke or not. Do not smoke! WHAT RESOURCES ARE AVAILABLE TO HELP ME QUIT SMOKING? Your health care provider can direct you to community resources or hospitals for support, which may include:  Group support.  Education.  Hypnosis.  Therapy.   This information is not intended to replace advice given to you  by your health care provider. Make sure you discuss any questions you have with your health care provider.   Document Released: 10/25/2003 Document Revised: 02/16/2014 Document Reviewed: 07/14/2012 Elsevier Interactive Patient Education [employer]
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
## 2015-03-11 NOTE — ED Notes (Signed)
Pt c/o sickle cell pain x 2 days, generalized over body and worse in back. Pt denies Chest pain or SOB. A&Ox4 and ambulatory. Pt sts prescription percocet is not helping.

## 2015-03-13 ENCOUNTER — Encounter (HOSPITAL_COMMUNITY): Payer: Self-pay | Admitting: Emergency Medicine

## 2015-03-13 ENCOUNTER — Emergency Department (HOSPITAL_COMMUNITY)
Admission: EM | Admit: 2015-03-13 | Discharge: 2015-03-13 | Discharge status: Against Medical Advice | Payer: Medicaid - Out of State | Attending: Emergency Medicine | Admitting: Emergency Medicine

## 2015-03-13 DIAGNOSIS — D5700 Hb-SS disease with crisis, unspecified: Secondary | ICD-10-CM | POA: Insufficient documentation

## 2015-03-13 DIAGNOSIS — F17210 Nicotine dependence, cigarettes, uncomplicated: Secondary | ICD-10-CM | POA: Insufficient documentation

## 2015-03-13 DIAGNOSIS — M545 Low back pain, unspecified: Secondary | ICD-10-CM

## 2015-03-13 DIAGNOSIS — Z79899 Other long term (current) drug therapy: Secondary | ICD-10-CM | POA: Diagnosis not present

## 2015-03-13 LAB — URINALYSIS, ROUTINE W REFLEX MICROSCOPIC
BILIRUBIN URINE: NEGATIVE
Glucose, UA: NEGATIVE mg/dL
Hgb urine dipstick: NEGATIVE
KETONES UR: NEGATIVE mg/dL
Leukocytes, UA: NEGATIVE
NITRITE: NEGATIVE
PH: 7 (ref 5.0–8.0)
PROTEIN: NEGATIVE mg/dL
Specific Gravity, Urine: 1.009 (ref 1.005–1.030)

## 2015-03-13 LAB — CBC WITH DIFFERENTIAL/PLATELET
Basophils Absolute: 0.1 10*3/uL (ref 0.0–0.1)
Basophils Relative: 1 %
EOS ABS: 0.6 10*3/uL (ref 0.0–0.7)
EOS PCT: 3 %
HCT: 25 % — ABNORMAL LOW (ref 39.0–52.0)
Hemoglobin: 8.9 g/dL — ABNORMAL LOW (ref 13.0–17.0)
LYMPHS ABS: 3.3 10*3/uL (ref 0.7–4.0)
LYMPHS PCT: 20 %
MCH: 31.9 pg (ref 26.0–34.0)
MCHC: 35.6 g/dL (ref 30.0–36.0)
MCV: 89.6 fL (ref 78.0–100.0)
MONO ABS: 2.5 10*3/uL — AB (ref 0.1–1.0)
Monocytes Relative: 15 %
Neutro Abs: 10.5 10*3/uL — ABNORMAL HIGH (ref 1.7–7.7)
Neutrophils Relative %: 61 %
PLATELETS: 293 10*3/uL (ref 150–400)
RBC: 2.79 MIL/uL — ABNORMAL LOW (ref 4.22–5.81)
RDW: 16.8 % — AB (ref 11.5–15.5)
WBC: 17 10*3/uL — ABNORMAL HIGH (ref 4.0–10.5)

## 2015-03-13 LAB — COMPREHENSIVE METABOLIC PANEL
ALT: 34 U/L (ref 17–63)
ANION GAP: 9 (ref 5–15)
AST: 41 U/L (ref 15–41)
Albumin: 4.8 g/dL (ref 3.5–5.0)
Alkaline Phosphatase: 109 U/L (ref 38–126)
BUN: 9 mg/dL (ref 6–20)
CHLORIDE: 103 mmol/L (ref 101–111)
CO2: 26 mmol/L (ref 22–32)
CREATININE: 0.53 mg/dL — AB (ref 0.61–1.24)
Calcium: 9.5 mg/dL (ref 8.9–10.3)
Glucose, Bld: 95 mg/dL (ref 65–99)
POTASSIUM: 4.2 mmol/L (ref 3.5–5.1)
SODIUM: 138 mmol/L (ref 135–145)
Total Bilirubin: 3.7 mg/dL — ABNORMAL HIGH (ref 0.3–1.2)
Total Protein: 8.5 g/dL — ABNORMAL HIGH (ref 6.5–8.1)

## 2015-03-13 LAB — RETICULOCYTES
RBC.: 2.79 MIL/uL — AB (ref 4.22–5.81)
RETIC COUNT ABSOLUTE: 309.7 10*3/uL — AB (ref 19.0–186.0)
RETIC CT PCT: 11.1 % — AB (ref 0.4–3.1)

## 2015-03-13 MED ORDER — PROMETHAZINE HCL 25 MG/ML IJ SOLN
25.0000 mg | Freq: Once | INTRAMUSCULAR | Status: AC
  Administered 2015-03-13: 25 mg via INTRAVENOUS
  Filled 2015-03-13: qty 1

## 2015-03-13 MED ORDER — HYDROMORPHONE HCL 2 MG/ML IJ SOLN
3.0000 mg | Freq: Once | INTRAMUSCULAR | Status: AC
  Administered 2015-03-13: 3 mg via INTRAVENOUS
  Filled 2015-03-13: qty 2

## 2015-03-13 MED ORDER — OXYCODONE HCL 5 MG PO TABS: 5.0000 mg | ORAL_TABLET | Freq: Once | ORAL | Status: DC

## 2015-03-13 MED ORDER — DIPHENHYDRAMINE HCL 25 MG PO CAPS
25.0000 mg | ORAL_CAPSULE | Freq: Once | ORAL | Status: AC
  Administered 2015-03-13: 25 mg via ORAL
  Filled 2015-03-13: qty 1

## 2015-03-13 MED ORDER — DIPHENHYDRAMINE HCL 50 MG/ML IJ SOLN
25.0000 mg | Freq: Once | INTRAMUSCULAR | Status: AC
  Administered 2015-03-13: 25 mg via INTRAVENOUS
  Filled 2015-03-13: qty 1

## 2015-03-13 MED ORDER — ONDANSETRON HCL 4 MG/2ML IJ SOLN
4.0000 mg | Freq: Once | INTRAMUSCULAR | Status: AC
  Administered 2015-03-13: 4 mg via INTRAVENOUS
  Filled 2015-03-13: qty 2

## 2015-03-13 MED ORDER — DEXTROSE 5 % IV SOLN
Freq: Once | INTRAVENOUS | Status: AC
  Administered 2015-03-13: 18:00:00 via INTRAVENOUS

## 2015-03-13 NOTE — ED Notes (Signed)
Pt advised/encouraged to provide U/A per MD order as soon as possible. Urinal at bedside; pt confirmed/understood. ENM
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
## 2015-03-13 NOTE — ED Notes (Signed)
Patient given Sprite, saltine crackers and peanut butter per request.

## 2015-03-13 NOTE — Discharge Instructions (Signed)
Sickle Cell Anemia, Adult                                                  Follow-up with the sickle cell clinic tomorrow.  Sickle cell anemia is a condition where your red blood cells are shaped like sickles. Red blood cells carry oxygen through the body. Sickle-shaped red blood cells do not live as long as normal red blood cells. They also clump together and block blood from flowing through the blood vessels. These things prevent the body from getting enough oxygen. Sickle cell anemia causes organ damage and pain. It also increases the risk of infection. HOME CARE  Drink enough fluid to keep your pee (urine) clear or pale yellow. Drink more in hot weather and during exercise.  Do not smoke. Smoking lowers oxygen levels in the blood.  Only take over-the-counter or prescription medicines as told by your doctor.  Take antibiotic medicines as told by your doctor. Make sure you finish them even if you start to feel better.  Take supplements as told by your doctor.  Consider wearing a medical alert bracelet. This tells anyone caring for you in an emergency of your condition.  When traveling, keep your medical information, doctors' names, and the medicines you take with you at all times.  If you have a fever, do not take fever medicines right away. This could cover up a problem. Tell your doctor.   Keep all follow-up visits with your doctor. Sickle cell anemia requires regular medical care. GET HELP IF: You have a fever. GET HELP RIGHT AWAY IF:  You feel dizzy or faint.  You have new belly (abdominal) pain, especially on the left side near the stomach area.  You have a lasting, often uncomfortable and painful erection of the penis (priapism). If it is not treated right away, you will become unable to have sex (impotence).  You have numbness in your arms or legs or you have a hard time moving them.  You have a hard time talking.  You have a fever or lasting symptoms for more than 2-3  days.  You have a fever and your symptoms suddenly get worse.  You have signs or symptoms of infection. These include:  Chills.  Being more tired than normal (lethargy).  Irritability.  Poor eating.  Throwing up (vomiting).  You have pain that is not helped with medicine.  You have shortness of breath.  You have pain in your chest.  You are coughing up pus-like or bloody mucus.  You have a stiff neck.  Your feet or hands swell or have pain.  Your belly looks bloated.  Your joints hurt. MAKE SURE YOU:  Understand these instructions.  Will watch your condition.  Will get help right away if you are not doing well or get worse.   This information is not intended to replace advice given to you by your health care provider. Make sure you discuss any questions you have with your health care provider.   Document Released: 11/16/2012 Document Revised: 06/12/2014 Document Reviewed: 11/16/2012 Elsevier Interactive Patient Education [employer]

## 2015-03-13 NOTE — Progress Notes (Addendum)
Pt with 54 ED visits Pt last seen at [facility] sickle cell clinic on 03/05/15 and has a return appointment on 04/09/15 at 1100 Still remains with medicaid of [place]  No Cm interventions available for pt

## 2015-03-13 NOTE — ED Notes (Signed)
Pt walked out without advising staff; Pt pulled his own IV out and left it on the floor.

## 2015-03-13 NOTE — ED Notes (Signed)
Pt reports pain "all over", Hx sickle cell. denies chest pain nor shortness of breath. sts called the  clinic and was recommended to come to ED.

## 2015-03-22 ENCOUNTER — Emergency Department (HOSPITAL_COMMUNITY)
Admission: EM | Admit: 2015-03-22 | Discharge: 2015-03-23 | Disposition: A | Discharge status: Home / Self Care | Payer: Medicaid - Out of State | Attending: Emergency Medicine | Admitting: Emergency Medicine

## 2015-03-22 ENCOUNTER — Encounter (HOSPITAL_COMMUNITY): Payer: Self-pay

## 2015-03-22 DIAGNOSIS — Z79899 Other long term (current) drug therapy: Secondary | ICD-10-CM | POA: Diagnosis not present

## 2015-03-22 DIAGNOSIS — D5700 Hb-SS disease with crisis, unspecified: Secondary | ICD-10-CM | POA: Insufficient documentation

## 2015-03-22 DIAGNOSIS — F17210 Nicotine dependence, cigarettes, uncomplicated: Secondary | ICD-10-CM | POA: Insufficient documentation

## 2015-03-22 LAB — COMPREHENSIVE METABOLIC PANEL
ALK PHOS: 92 U/L (ref 38–126)
ALT: 12 U/L — ABNORMAL LOW (ref 17–63)
ANION GAP: 8 (ref 5–15)
AST: 22 U/L (ref 15–41)
Albumin: 4.5 g/dL (ref 3.5–5.0)
BILIRUBIN TOTAL: 3.1 mg/dL — AB (ref 0.3–1.2)
BUN: 9 mg/dL (ref 6–20)
CALCIUM: 9.4 mg/dL (ref 8.9–10.3)
CO2: 27 mmol/L (ref 22–32)
Chloride: 110 mmol/L (ref 101–111)
Creatinine, Ser: 0.43 mg/dL — ABNORMAL LOW (ref 0.61–1.24)
GFR calc non Af Amer: >60 mL/min (ref >60)
Glucose, Bld: 94 mg/dL (ref 65–99)
POTASSIUM: 3.9 mmol/L (ref 3.5–5.1)
SODIUM: 145 mmol/L (ref 135–145)
TOTAL PROTEIN: 8 g/dL (ref 6.5–8.1)

## 2015-03-22 LAB — CBC WITH DIFFERENTIAL/PLATELET
BASOS PCT: 1 %
Basophils Absolute: 0.1 10*3/uL (ref 0.0–0.1)
EOS ABS: 0.3 10*3/uL (ref 0.0–0.7)
Eosinophils Relative: 3 %
HEMATOCRIT: 28.2 % — AB (ref 39.0–52.0)
HEMOGLOBIN: 9.6 g/dL — AB (ref 13.0–17.0)
LYMPHS ABS: 2.8 10*3/uL (ref 0.7–4.0)
Lymphocytes Relative: 25 %
MCH: 32.2 pg (ref 26.0–34.0)
MCHC: 34 g/dL (ref 30.0–36.0)
MCV: 94.6 fL (ref 78.0–100.0)
MONO ABS: 1.4 10*3/uL — AB (ref 0.1–1.0)
MONOS PCT: 12 %
NEUTROS PCT: 59 %
Neutro Abs: 6.6 10*3/uL (ref 1.7–7.7)
Platelets: 402 10*3/uL — ABNORMAL HIGH (ref 150–400)
RBC: 2.98 MIL/uL — ABNORMAL LOW (ref 4.22–5.81)
RDW: 18.4 % — AB (ref 11.5–15.5)
WBC: 11.2 10*3/uL — ABNORMAL HIGH (ref 4.0–10.5)

## 2015-03-22 LAB — RETICULOCYTES
RBC.: 2.98 MIL/uL — AB (ref 4.22–5.81)
RETIC COUNT ABSOLUTE: 333.8 10*3/uL — AB (ref 19.0–186.0)
Retic Ct Pct: 11.2 % — ABNORMAL HIGH (ref 0.4–3.1)

## 2015-03-22 MED ORDER — SODIUM CHLORIDE 0.9 % IV SOLN: INTRAVENOUS | Status: DC

## 2015-03-22 MED ORDER — HYDROMORPHONE HCL 2 MG/ML IJ SOLN
3.0000 mg | INTRAMUSCULAR | Status: DC | PRN
  Administered 2015-03-22 (×2): 3 mg via INTRAVENOUS
  Filled 2015-03-22 (×2): qty 2

## 2015-03-22 MED ORDER — DEXTROSE-NACL 5-0.45 % IV SOLN
INTRAVENOUS | Status: DC
  Administered 2015-03-22: 22:00:00 via INTRAVENOUS

## 2015-03-22 MED ORDER — DIPHENHYDRAMINE HCL 25 MG PO CAPS
25.0000 mg | ORAL_CAPSULE | Freq: Once | ORAL | Status: AC
  Administered 2015-03-22: 25 mg via ORAL
  Filled 2015-03-22: qty 1

## 2015-03-22 MED ORDER — OXYCODONE HCL 5 MG PO TABS
5.0000 mg | ORAL_TABLET | Freq: Once | ORAL | Status: AC
  Administered 2015-03-23: 5 mg via ORAL
  Filled 2015-03-22: qty 1

## 2015-03-22 MED ORDER — PROMETHAZINE HCL 25 MG/ML IJ SOLN
25.0000 mg | Freq: Once | INTRAMUSCULAR | Status: AC
  Administered 2015-03-22: 25 mg via INTRAVENOUS
  Filled 2015-03-22: qty 1

## 2015-03-22 MED ORDER — KETOROLAC TROMETHAMINE 30 MG/ML IJ SOLN
30.0000 mg | Freq: Once | INTRAMUSCULAR | Status: AC
  Administered 2015-03-22: 30 mg via INTRAVENOUS
  Filled 2015-03-22: qty 1

## 2015-03-22 NOTE — Progress Notes (Addendum)
Pt with 55 ED visits and 13 admissions Pt has not been compliant He has not changed medicaid coverage Last Ov with Dr Hyman Hopes on 03/05/15  Has ED CP and narcotic precautions  Entered in d/c instructions  Jegede, Olugbemiga E Go on 04/09/2015 You have a 1 month follow up appointment with Dr Hyman Hopes on 04/09/15 at 11 am [address] [place] [address] [phone]  medicaid patient [place]: [phone] [address] [place], [place] [address] [email or web address] Use this website to assist with understanding your coverage & to renew application As a Medicaid client you MUST contact DSS/SSI each time you change address, move to another [place] or another state to keep your address updated [place]: [phone] [address] [place], [place] [address] [email or web address]

## 2015-03-22 NOTE — ED Notes (Signed)
Patient c/o upper, mid, and lower back pain due to sickle cell.

## 2015-03-22 NOTE — Discharge Instructions (Signed)
Sickle Cell Anemia, Adult Sickle cell anemia is a condition in which red blood cells have an abnormal "sickle" shape. This abnormal shape shortens the cells' life span, which results in a lower than normal concentration of red blood cells in the blood. The sickle shape also causes the cells to clump together and block free blood flow through the blood vessels. As a result, the tissues and organs of the body do not receive enough oxygen. Sickle cell anemia causes organ damage and pain and increases the risk of infection. CAUSES  Sickle cell anemia is a genetic disorder. Those who receive two copies of the gene have the condition, and those who receive one copy have the trait. RISK FACTORS The sickle cell gene is most common in people whose families originated in [place]. Other areas of the globe where sickle cell trait occurs include the Mediterranean, [place] and [place], the [place], and the [place].  SIGNS AND SYMPTOMS  Pain, especially in the extremities, back, chest, or abdomen (common). The pain may start suddenly or may develop following an illness, especially if there is dehydration. Pain can also occur due to overexertion or exposure to extreme temperature changes.  Frequent severe bacterial infections, especially certain types of pneumonia and meningitis.  Pain and swelling in the hands and feet.  Decreased activity.   Loss of appetite.   Change in behavior.  Headaches.  Seizures.  Shortness of breath or difficulty breathing.  Vision changes.  Skin ulcers. Those with the trait may not have symptoms or they may have mild symptoms.  DIAGNOSIS  Sickle cell anemia is diagnosed with blood tests that demonstrate the genetic trait. It is often diagnosed during the newborn period, due to mandatory testing nationwide. A variety of blood tests, X-rays, CT scans, MRI scans, ultrasounds, and lung function tests may also be done to monitor the condition. TREATMENT  Sickle  cell anemia may be treated with:  Medicines. You may be given pain medicines, antibiotic medicines (to treat and prevent infections) or medicines to increase the production of certain types of hemoglobin.  Fluids.  Oxygen.  Blood transfusions. HOME CARE INSTRUCTIONS   Drink enough fluid to keep your urine clear or pale yellow. Increase your fluid intake in hot weather and during exercise.  Do not smoke. Smoking lowers oxygen levels in the blood.   Only take over-the-counter or prescription medicines for pain, fever, or discomfort as directed by your health care provider.  Take antibiotics as directed by your health care provider. Make sure you finish them it even if you start to feel better.   Take supplements as directed by your health care provider.   Consider wearing a medical alert bracelet. This tells anyone caring for you in an emergency of your condition.   When traveling, keep your medical information, health care provider's names, and the medicines you take with you at all times.   If you develop a fever, do not take medicines to reduce the fever right away. This could cover up a problem that is developing. Notify your health care provider.  Keep all follow-up appointments with your health care provider. Sickle cell anemia requires regular medical care. SEEK MEDICAL CARE IF: You have a fever. SEEK IMMEDIATE MEDICAL CARE IF:   You feel dizzy or faint.   You have new abdominal pain, especially on the left side near the stomach area.   You develop a persistent, often uncomfortable and painful penile erection (priapism). If this is not treated immediately it   will lead to impotence.   You have numbness your arms or legs or you have a hard time moving them.   You have a hard time with speech.   You have a fever or persistent symptoms for more than 2-3 days.   You have a fever and your symptoms suddenly get worse.   You have signs or symptoms of infection.  These include:   Chills.   Abnormal tiredness (lethargy).   Irritability.   Poor eating.   Vomiting.   You develop pain that is not helped with medicine.   You develop shortness of breath.  You have pain in your chest.   You are coughing up pus-like or bloody sputum.   You develop a stiff neck.  Your feet or hands swell or have pain.  Your abdomen appears bloated.  You develop joint pain. MAKE SURE YOU:  Understand these instructions.   This information is not intended to replace advice given to you by your health care provider. Make sure you discuss any questions you have with your health care provider.   Document Released: 05/06/2005 Document Revised: 02/16/2014 Document Reviewed: 09/07/2012 Elsevier Interactive Patient Education 2016 Elsevier Inc.  
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
## 2015-03-26 ENCOUNTER — Emergency Department (HOSPITAL_COMMUNITY): Payer: Medicaid - Out of State

## 2015-03-26 ENCOUNTER — Encounter (HOSPITAL_COMMUNITY): Payer: Self-pay | Admitting: Emergency Medicine

## 2015-03-26 ENCOUNTER — Emergency Department (HOSPITAL_COMMUNITY)
Admission: EM | Admit: 2015-03-26 | Discharge: 2015-03-26 | Disposition: A | Discharge status: Home / Self Care | Payer: Medicaid - Out of State | Attending: Emergency Medicine | Admitting: Emergency Medicine

## 2015-03-26 DIAGNOSIS — F17210 Nicotine dependence, cigarettes, uncomplicated: Secondary | ICD-10-CM | POA: Insufficient documentation

## 2015-03-26 DIAGNOSIS — R0602 Shortness of breath: Secondary | ICD-10-CM | POA: Insufficient documentation

## 2015-03-26 DIAGNOSIS — D5700 Hb-SS disease with crisis, unspecified: Secondary | ICD-10-CM | POA: Insufficient documentation

## 2015-03-26 DIAGNOSIS — R05 Cough: Secondary | ICD-10-CM | POA: Diagnosis not present

## 2015-03-26 DIAGNOSIS — R0981 Nasal congestion: Secondary | ICD-10-CM | POA: Diagnosis not present

## 2015-03-26 DIAGNOSIS — Z79899 Other long term (current) drug therapy: Secondary | ICD-10-CM | POA: Insufficient documentation

## 2015-03-26 DIAGNOSIS — R110 Nausea: Secondary | ICD-10-CM | POA: Insufficient documentation

## 2015-03-26 LAB — COMPREHENSIVE METABOLIC PANEL
ALT: 19 U/L (ref 17–63)
ANION GAP: 7 (ref 5–15)
AST: 42 U/L — ABNORMAL HIGH (ref 15–41)
Albumin: 4.4 g/dL (ref 3.5–5.0)
Alkaline Phosphatase: 85 U/L (ref 38–126)
BUN: 9 mg/dL (ref 6–20)
CALCIUM: 9.2 mg/dL (ref 8.9–10.3)
CHLORIDE: 105 mmol/L (ref 101–111)
CO2: 27 mmol/L (ref 22–32)
Creatinine, Ser: 0.37 mg/dL — ABNORMAL LOW (ref 0.61–1.24)
GFR calc non Af Amer: >60 mL/min (ref >60)
Glucose, Bld: 82 mg/dL (ref 65–99)
Potassium: 4.5 mmol/L (ref 3.5–5.1)
SODIUM: 139 mmol/L (ref 135–145)
Total Bilirubin: 3.7 mg/dL — ABNORMAL HIGH (ref 0.3–1.2)
Total Protein: 8 g/dL (ref 6.5–8.1)

## 2015-03-26 LAB — CBC WITH DIFFERENTIAL/PLATELET
BASOS PCT: 1 %
Basophils Absolute: 0.1 10*3/uL (ref 0.0–0.1)
EOS ABS: 0.3 10*3/uL (ref 0.0–0.7)
EOS PCT: 2 %
HCT: 25.2 % — ABNORMAL LOW (ref 39.0–52.0)
HEMOGLOBIN: 8.9 g/dL — AB (ref 13.0–17.0)
LYMPHS PCT: 32 %
Lymphs Abs: 4.1 10*3/uL — ABNORMAL HIGH (ref 0.7–4.0)
MCH: 32.1 pg (ref 26.0–34.0)
MCHC: 35.3 g/dL (ref 30.0–36.0)
MCV: 91 fL (ref 78.0–100.0)
Monocytes Absolute: 1.4 10*3/uL — ABNORMAL HIGH (ref 0.1–1.0)
Monocytes Relative: 11 %
NEUTROS PCT: 54 %
Neutro Abs: 7 10*3/uL (ref 1.7–7.7)
Platelets: 292 10*3/uL (ref 150–400)
RBC: 2.77 MIL/uL — AB (ref 4.22–5.81)
RDW: 18 % — ABNORMAL HIGH (ref 11.5–15.5)
WBC: 12.9 10*3/uL — ABNORMAL HIGH (ref 4.0–10.5)

## 2015-03-26 LAB — I-STAT TROPONIN, ED: TROPONIN I, POC: 0 ng/mL (ref 0.00–0.08)

## 2015-03-26 LAB — RETICULOCYTES
RBC.: 3.25 MIL/uL — ABNORMAL LOW (ref 4.22–5.81)
RETIC CT PCT: 9.5 % — AB (ref 0.4–3.1)
Retic Count, Absolute: 308.8 10*3/uL — ABNORMAL HIGH (ref 19.0–186.0)

## 2015-03-26 MED ORDER — DIPHENHYDRAMINE HCL 25 MG PO CAPS
25.0000 mg | ORAL_CAPSULE | Freq: Once | ORAL | Status: AC
  Administered 2015-03-26: 25 mg via ORAL
  Filled 2015-03-26: qty 1

## 2015-03-26 MED ORDER — KETOROLAC TROMETHAMINE 30 MG/ML IJ SOLN
30.0000 mg | Freq: Once | INTRAMUSCULAR | Status: AC
  Administered 2015-03-26: 30 mg via INTRAVENOUS
  Filled 2015-03-26: qty 1

## 2015-03-26 MED ORDER — DEXTROSE-NACL 5-0.45 % IV SOLN
INTRAVENOUS | Status: DC
  Administered 2015-03-26: 17:00:00 via INTRAVENOUS

## 2015-03-26 MED ORDER — OXYCODONE-ACETAMINOPHEN 5-325 MG PO TABS
1.0000 | ORAL_TABLET | Freq: Once | ORAL | Status: AC
  Administered 2015-03-26: 1 via ORAL
  Filled 2015-03-26: qty 1

## 2015-03-26 MED ORDER — HYDROMORPHONE HCL 2 MG/ML IJ SOLN
3.0000 mg | INTRAMUSCULAR | Status: AC
  Administered 2015-03-26 (×3): 3 mg via INTRAVENOUS
  Filled 2015-03-26 (×3): qty 2

## 2015-03-26 MED ORDER — PROMETHAZINE HCL 25 MG/ML IJ SOLN
25.0000 mg | Freq: Once | INTRAMUSCULAR | Status: AC
  Administered 2015-03-26: 25 mg via INTRAVENOUS
  Filled 2015-03-26: qty 1

## 2015-03-26 NOTE — Discharge Instructions (Signed)
Sickle Cell Anemia, Adult Sickle cell anemia is a condition in which red blood cells have an abnormal "sickle" shape. This abnormal shape shortens the cells' life span, which results in a lower than normal concentration of red blood cells in the blood. The sickle shape also causes the cells to clump together and block free blood flow through the blood vessels. As a result, the tissues and organs of the body do not receive enough oxygen. Sickle cell anemia causes organ damage and pain and increases the risk of infection. CAUSES  Sickle cell anemia is a genetic disorder. Those who receive two copies of the gene have the condition, and those who receive one copy have the trait. RISK FACTORS The sickle cell gene is most common in people whose families originated in [place]. Other areas of the globe where sickle cell trait occurs include the Mediterranean, [place] and [place], the [place], and the [place].  SIGNS AND SYMPTOMS  Pain, especially in the extremities, back, chest, or abdomen (common). The pain may start suddenly or may develop following an illness, especially if there is dehydration. Pain can also occur due to overexertion or exposure to extreme temperature changes.  Frequent severe bacterial infections, especially certain types of pneumonia and meningitis.  Pain and swelling in the hands and feet.  Decreased activity.   Loss of appetite.   Change in behavior.  Headaches.  Seizures.  Shortness of breath or difficulty breathing.  Vision changes.  Skin ulcers. Those with the trait may not have symptoms or they may have mild symptoms.  DIAGNOSIS  Sickle cell anemia is diagnosed with blood tests that demonstrate the genetic trait. It is often diagnosed during the newborn period, due to mandatory testing nationwide. A variety of blood tests, X-rays, CT scans, MRI scans, ultrasounds, and lung function tests may also be done to monitor the condition. TREATMENT  Sickle  cell anemia may be treated with:  Medicines. You may be given pain medicines, antibiotic medicines (to treat and prevent infections) or medicines to increase the production of certain types of hemoglobin.  Fluids.  Oxygen.  Blood transfusions. HOME CARE INSTRUCTIONS   Drink enough fluid to keep your urine clear or pale yellow. Increase your fluid intake in hot weather and during exercise.  Do not smoke. Smoking lowers oxygen levels in the blood.   Only take over-the-counter or prescription medicines for pain, fever, or discomfort as directed by your health care provider.  Take antibiotics as directed by your health care provider. Make sure you finish them it even if you start to feel better.   Take supplements as directed by your health care provider.   Consider wearing a medical alert bracelet. This tells anyone caring for you in an emergency of your condition.   When traveling, keep your medical information, health care provider's names, and the medicines you take with you at all times.   If you develop a fever, do not take medicines to reduce the fever right away. This could cover up a problem that is developing. Notify your health care provider.  Keep all follow-up appointments with your health care provider. Sickle cell anemia requires regular medical care. SEEK MEDICAL CARE IF: You have a fever. SEEK IMMEDIATE MEDICAL CARE IF:   You feel dizzy or faint.   You have new abdominal pain, especially on the left side near the stomach area.   You develop a persistent, often uncomfortable and painful penile erection (priapism). If this is not treated immediately it   will lead to impotence.   You have numbness your arms or legs or you have a hard time moving them.   You have a hard time with speech.   You have a fever or persistent symptoms for more than 2-3 days.   You have a fever and your symptoms suddenly get worse.   You have signs or symptoms of infection.  These include:   Chills.   Abnormal tiredness (lethargy).   Irritability.   Poor eating.   Vomiting.   You develop pain that is not helped with medicine.   You develop shortness of breath.  You have pain in your chest.   You are coughing up pus-like or bloody sputum.   You develop a stiff neck.  Your feet or hands swell or have pain.  Your abdomen appears bloated.  You develop joint pain. MAKE SURE YOU:  Understand these instructions.   This information is not intended to replace advice given to you by your health care provider. Make sure you discuss any questions you have with your health care provider.   Document Released: 05/06/2005 Document Revised: 02/16/2014 Document Reviewed: 09/07/2012 Elsevier Interactive Patient Education 2016 Elsevier Inc.  

## 2015-03-26 NOTE — ED Notes (Signed)
Generalized sickle cell body aches with chest pain beginning yesterday. Also c/o SOB, nausea, and weakness.

## 2015-03-26 NOTE — ED Notes (Signed)
Bed: WA19 Expected date:  Expected time:  Means of arrival:  Comments: TR 1 

## 2015-03-26 NOTE — ED Notes (Signed)
Provided patient Matthew Shannon, peanut butter, and ginger ale with permission from provider.
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
## 2015-03-26 NOTE — ED Notes (Signed)
Patient is alert and oriented x3.  He was given DC instructions and follow up visit instructions.  Patient gave verbal understanding.  He was DC ambulatory under his own power to home.  V/S stable.  He was not showing any signs of distress on DC 

## 2015-03-28 ENCOUNTER — Telehealth: Payer: Self-pay | Admitting: Internal Medicine

## 2015-03-28 NOTE — Telephone Encounter (Signed)
Refill request for oxycodone . LOV 03/05/2015. Please advise. Thanks!

## 2015-03-29 ENCOUNTER — Other Ambulatory Visit: Payer: Self-pay | Admitting: Internal Medicine

## 2015-03-29 DIAGNOSIS — D5700 Hb-SS disease with crisis, unspecified: Secondary | ICD-10-CM

## 2015-03-29 MED ORDER — OXYCODONE HCL 15 MG PO TABS: 15.0000 mg | ORAL_TABLET | ORAL | Status: DC | PRN

## 2015-03-30 ENCOUNTER — Emergency Department (HOSPITAL_COMMUNITY)
Admission: EM | Admit: 2015-03-30 | Discharge: 2015-03-30 | Disposition: A | Discharge status: Home / Self Care | Payer: Medicaid - Out of State | Attending: Emergency Medicine | Admitting: Emergency Medicine

## 2015-03-30 ENCOUNTER — Encounter (HOSPITAL_COMMUNITY): Payer: Self-pay | Admitting: Emergency Medicine

## 2015-03-30 DIAGNOSIS — Z79899 Other long term (current) drug therapy: Secondary | ICD-10-CM | POA: Insufficient documentation

## 2015-03-30 DIAGNOSIS — D5700 Hb-SS disease with crisis, unspecified: Secondary | ICD-10-CM | POA: Diagnosis not present

## 2015-03-30 DIAGNOSIS — F17210 Nicotine dependence, cigarettes, uncomplicated: Secondary | ICD-10-CM | POA: Diagnosis not present

## 2015-03-30 DIAGNOSIS — R109 Unspecified abdominal pain: Secondary | ICD-10-CM | POA: Diagnosis present

## 2015-03-30 MED ORDER — OXYCODONE HCL 5 MG PO TABS
5.0000 mg | ORAL_TABLET | Freq: Once | ORAL | Status: AC
  Administered 2015-03-30: 5 mg via ORAL
  Filled 2015-03-30: qty 1

## 2015-03-30 MED ORDER — ONDANSETRON HCL 4 MG/2ML IJ SOLN
4.0000 mg | Freq: Once | INTRAMUSCULAR | Status: AC
  Administered 2015-03-30: 4 mg via INTRAVENOUS
  Filled 2015-03-30: qty 2

## 2015-03-30 MED ORDER — DEXTROSE-NACL 5-0.45 % IV SOLN
INTRAVENOUS | Status: DC
  Administered 2015-03-30: 15:00:00 via INTRAVENOUS

## 2015-03-30 MED ORDER — DIPHENHYDRAMINE HCL 25 MG PO CAPS
25.0000 mg | ORAL_CAPSULE | Freq: Once | ORAL | Status: AC
  Administered 2015-03-30: 25 mg via ORAL
  Filled 2015-03-30: qty 1

## 2015-03-30 MED ORDER — HYDROMORPHONE HCL 2 MG/ML IJ SOLN
3.0000 mg | Freq: Once | INTRAMUSCULAR | Status: AC
  Administered 2015-03-30: 3 mg via INTRAVENOUS
  Filled 2015-03-30: qty 2

## 2015-03-30 MED ORDER — PROMETHAZINE HCL 25 MG/ML IJ SOLN
25.0000 mg | Freq: Once | INTRAMUSCULAR | Status: AC
  Administered 2015-03-30: 25 mg via INTRAVENOUS
  Filled 2015-03-30: qty 1

## 2015-03-30 MED ORDER — KETOROLAC TROMETHAMINE 30 MG/ML IJ SOLN
30.0000 mg | Freq: Once | INTRAMUSCULAR | Status: AC
  Administered 2015-03-30: 30 mg via INTRAVENOUS
  Filled 2015-03-30: qty 1

## 2015-03-30 NOTE — Discharge Instructions (Signed)
You were seen in the emergency room today for abdominal pain and back pain that is most likely related to your sickle cell pain. We did not check labs today because your bloodwork just a few days ago was normal. We were able to control your pain today. Please follow up with the sickle cell clinic on Monday. Return to the ER for new or worsening symptoms.

## 2015-03-30 NOTE — ED Notes (Signed)
Pt reports abd pain , denies emesis nor diarrhea. Also reports back pain. Pt believes pain is sickle cell crisis. denies shortness of breath nor chest pain.
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
## 2015-04-06 ENCOUNTER — Encounter (HOSPITAL_COMMUNITY): Payer: Self-pay | Admitting: Emergency Medicine

## 2015-04-06 ENCOUNTER — Emergency Department (HOSPITAL_COMMUNITY): Payer: Medicaid - Out of State

## 2015-04-06 ENCOUNTER — Emergency Department (HOSPITAL_COMMUNITY)
Admission: EM | Admit: 2015-04-06 | Discharge: 2015-04-06 | Disposition: A | Discharge status: Home / Self Care | Payer: Medicaid - Out of State | Attending: Emergency Medicine | Admitting: Emergency Medicine

## 2015-04-06 DIAGNOSIS — D5700 Hb-SS disease with crisis, unspecified: Secondary | ICD-10-CM | POA: Insufficient documentation

## 2015-04-06 DIAGNOSIS — Z79899 Other long term (current) drug therapy: Secondary | ICD-10-CM | POA: Diagnosis not present

## 2015-04-06 DIAGNOSIS — F17210 Nicotine dependence, cigarettes, uncomplicated: Secondary | ICD-10-CM | POA: Insufficient documentation

## 2015-04-06 DIAGNOSIS — R0602 Shortness of breath: Secondary | ICD-10-CM | POA: Insufficient documentation

## 2015-04-06 LAB — CBC WITH DIFFERENTIAL/PLATELET
Basophils Absolute: 0.1 10*3/uL (ref 0.0–0.1)
Basophils Relative: 1 %
Eosinophils Absolute: 0.6 10*3/uL (ref 0.0–0.7)
Eosinophils Relative: 4 %
HCT: 24.2 % — ABNORMAL LOW (ref 39.0–52.0)
HEMOGLOBIN: 8.5 g/dL — AB (ref 13.0–17.0)
LYMPHS ABS: 4.7 10*3/uL — AB (ref 0.7–4.0)
LYMPHS PCT: 32 %
MCH: 31.6 pg (ref 26.0–34.0)
MCHC: 35.1 g/dL (ref 30.0–36.0)
MCV: 90 fL (ref 78.0–100.0)
Monocytes Absolute: 1.8 10*3/uL — ABNORMAL HIGH (ref 0.1–1.0)
Monocytes Relative: 12 %
NEUTROS PCT: 51 %
Neutro Abs: 7.8 10*3/uL — ABNORMAL HIGH (ref 1.7–7.7)
Platelets: 362 10*3/uL (ref 150–400)
RBC: 2.69 MIL/uL — AB (ref 4.22–5.81)
RDW: 18.3 % — ABNORMAL HIGH (ref 11.5–15.5)
WBC: 14.9 10*3/uL — AB (ref 4.0–10.5)

## 2015-04-06 LAB — COMPREHENSIVE METABOLIC PANEL
ALBUMIN: 4.6 g/dL (ref 3.5–5.0)
ALK PHOS: 110 U/L (ref 38–126)
ALT: 17 U/L (ref 17–63)
AST: 26 U/L (ref 15–41)
Anion gap: 10 (ref 5–15)
BILIRUBIN TOTAL: 2.2 mg/dL — AB (ref 0.3–1.2)
BUN: 6 mg/dL (ref 6–20)
CALCIUM: 9.9 mg/dL (ref 8.9–10.3)
CO2: 26 mmol/L (ref 22–32)
CREATININE: 0.54 mg/dL — AB (ref 0.61–1.24)
Chloride: 105 mmol/L (ref 101–111)
GFR calc Af Amer: >60 mL/min (ref >60)
GLUCOSE: 87 mg/dL (ref 65–99)
Potassium: 3.8 mmol/L (ref 3.5–5.1)
Sodium: 141 mmol/L (ref 135–145)
TOTAL PROTEIN: 8.8 g/dL — AB (ref 6.5–8.1)

## 2015-04-06 LAB — I-STAT CG4 LACTIC ACID, ED: LACTIC ACID, VENOUS: 1.42 mmol/L (ref 0.5–2.0)

## 2015-04-06 LAB — RETICULOCYTES
RBC.: 2.69 MIL/uL — AB (ref 4.22–5.81)
RETIC CT PCT: 12.8 % — AB (ref 0.4–3.1)
Retic Count, Absolute: 344.3 10*3/uL — ABNORMAL HIGH (ref 19.0–186.0)

## 2015-04-06 MED ORDER — DIPHENHYDRAMINE HCL 25 MG PO CAPS
25.0000 mg | ORAL_CAPSULE | ORAL | Status: DC | PRN
  Administered 2015-04-06 (×2): 25 mg via ORAL
  Filled 2015-04-06 (×2): qty 1

## 2015-04-06 MED ORDER — DIPHENHYDRAMINE HCL 50 MG/ML IJ SOLN
25.0000 mg | Freq: Once | INTRAMUSCULAR | Status: AC
  Administered 2015-04-06: 25 mg via INTRAVENOUS
  Filled 2015-04-06: qty 1

## 2015-04-06 MED ORDER — PROMETHAZINE HCL 25 MG/ML IJ SOLN
12.5000 mg | Freq: Once | INTRAMUSCULAR | Status: AC
  Administered 2015-04-06: 12.5 mg via INTRAVENOUS
  Filled 2015-04-06: qty 1

## 2015-04-06 MED ORDER — HYDROMORPHONE HCL 2 MG/ML IJ SOLN: 0.0250 mg/kg | INTRAMUSCULAR | Status: DC

## 2015-04-06 MED ORDER — ONDANSETRON HCL 4 MG/2ML IJ SOLN
4.0000 mg | INTRAMUSCULAR | Status: DC | PRN
  Administered 2015-04-06: 4 mg via INTRAVENOUS
  Filled 2015-04-06: qty 2

## 2015-04-06 MED ORDER — SODIUM CHLORIDE 0.45 % IV SOLN
INTRAVENOUS | Status: DC
  Administered 2015-04-06: 150 mL/h via INTRAVENOUS

## 2015-04-06 MED ORDER — HYDROMORPHONE HCL 1 MG/ML IJ SOLN
2.0000 mg | INTRAMUSCULAR | Status: DC | PRN
  Administered 2015-04-06 (×3): 2 mg via INTRAVENOUS
  Filled 2015-04-06 (×4): qty 2

## 2015-04-06 NOTE — Discharge Instructions (Signed)
Sickle Cell Anemia, Adult Sickle cell anemia is a condition in which red blood cells have an abnormal "sickle" shape. This abnormal shape shortens the cells' life span, which results in a lower than normal concentration of red blood cells in the blood. The sickle shape also causes the cells to clump together and block free blood flow through the blood vessels. As a result, the tissues and organs of the body do not receive enough oxygen. Sickle cell anemia causes organ damage and pain and increases the risk of infection. CAUSES  Sickle cell anemia is a genetic disorder. Those who receive two copies of the gene have the condition, and those who receive one copy have the trait. RISK FACTORS The sickle cell gene is most common in people whose families originated in [place]. Other areas of the globe where sickle cell trait occurs include the Mediterranean, [place] and [place], the [place], and the [place].  SIGNS AND SYMPTOMS  Pain, especially in the extremities, back, chest, or abdomen (common). The pain may start suddenly or may develop following an illness, especially if there is dehydration. Pain can also occur due to overexertion or exposure to extreme temperature changes.  Frequent severe bacterial infections, especially certain types of pneumonia and meningitis.  Pain and swelling in the hands and feet.  Decreased activity.   Loss of appetite.   Change in behavior.  Headaches.  Seizures.  Shortness of breath or difficulty breathing.  Vision changes.  Skin ulcers. Those with the trait may not have symptoms or they may have mild symptoms.  DIAGNOSIS  Sickle cell anemia is diagnosed with blood tests that demonstrate the genetic trait. It is often diagnosed during the newborn period, due to mandatory testing nationwide. A variety of blood tests, X-rays, CT scans, MRI scans, ultrasounds, and lung function tests may also be done to monitor the condition. TREATMENT  Sickle  cell anemia may be treated with:  Medicines. You may be given pain medicines, antibiotic medicines (to treat and prevent infections) or medicines to increase the production of certain types of hemoglobin.  Fluids.  Oxygen.  Blood transfusions. HOME CARE INSTRUCTIONS   Drink enough fluid to keep your urine clear or pale yellow. Increase your fluid intake in hot weather and during exercise.  Do not smoke. Smoking lowers oxygen levels in the blood.   Only take over-the-counter or prescription medicines for pain, fever, or discomfort as directed by your health care provider.  Take antibiotics as directed by your health care provider. Make sure you finish them it even if you start to feel better.   Take supplements as directed by your health care provider.   Consider wearing a medical alert bracelet. This tells anyone caring for you in an emergency of your condition.   When traveling, keep your medical information, health care provider's names, and the medicines you take with you at all times.   If you develop a fever, do not take medicines to reduce the fever right away. This could cover up a problem that is developing. Notify your health care provider.  Keep all follow-up appointments with your health care provider. Sickle cell anemia requires regular medical care. SEEK MEDICAL CARE IF: You have a fever. SEEK IMMEDIATE MEDICAL CARE IF:   You feel dizzy or faint.   You have new abdominal pain, especially on the left side near the stomach area.   You develop a persistent, often uncomfortable and painful penile erection (priapism). If this is not treated immediately it   will lead to impotence.   You have numbness your arms or legs or you have a hard time moving them.   You have a hard time with speech.   You have a fever or persistent symptoms for more than 2-3 days.   You have a fever and your symptoms suddenly get worse.   You have signs or symptoms of infection.  These include:   Chills.   Abnormal tiredness (lethargy).   Irritability.   Poor eating.   Vomiting.   You develop pain that is not helped with medicine.   You develop shortness of breath.  You have pain in your chest.   You are coughing up pus-like or bloody sputum.   You develop a stiff neck.  Your feet or hands swell or have pain.  Your abdomen appears bloated.  You develop joint pain. MAKE SURE YOU:  Understand these instructions.   This information is not intended to replace advice given to you by your health care provider. Make sure you discuss any questions you have with your health care provider.   Document Released: 05/06/2005 Document Revised: 02/16/2014 Document Reviewed: 09/07/2012 Elsevier Interactive Patient Education 2016 Elsevier Inc.  

## 2015-04-06 NOTE — ED Notes (Signed)
Note written to [employer] for med waiting
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
## 2015-04-06 NOTE — ED Notes (Signed)
Reports abdominal pain, back pain, and right leg sickle cell pain starting last night. C/o nausea, denies vomiting, diarrhea, temp is mildly elevated in triage at 99.7, denies chills.

## 2015-04-06 NOTE — ED Notes (Signed)
Attempted IV venipuncture x1 and was unsuccessful to rt hand.

## 2015-04-07 ENCOUNTER — Emergency Department (HOSPITAL_COMMUNITY)
Admission: EM | Admit: 2015-04-07 | Discharge: 2015-04-07 | Disposition: A | Discharge status: Home / Self Care | Payer: Medicaid - Out of State | Attending: Emergency Medicine | Admitting: Emergency Medicine

## 2015-04-07 ENCOUNTER — Encounter (HOSPITAL_COMMUNITY): Payer: Self-pay | Admitting: Emergency Medicine

## 2015-04-07 DIAGNOSIS — D5700 Hb-SS disease with crisis, unspecified: Secondary | ICD-10-CM | POA: Insufficient documentation

## 2015-04-07 DIAGNOSIS — Z79891 Long term (current) use of opiate analgesic: Secondary | ICD-10-CM | POA: Insufficient documentation

## 2015-04-07 DIAGNOSIS — Z79899 Other long term (current) drug therapy: Secondary | ICD-10-CM | POA: Diagnosis not present

## 2015-04-07 DIAGNOSIS — F17210 Nicotine dependence, cigarettes, uncomplicated: Secondary | ICD-10-CM | POA: Diagnosis not present

## 2015-04-07 LAB — CBC WITH DIFFERENTIAL/PLATELET
BASOS PCT: 1 %
Basophils Absolute: 0.1 10*3/uL (ref 0.0–0.1)
EOS ABS: 0.7 10*3/uL (ref 0.0–0.7)
Eosinophils Relative: 4 %
HEMATOCRIT: 24.8 % — AB (ref 39.0–52.0)
HEMOGLOBIN: 8.7 g/dL — AB (ref 13.0–17.0)
LYMPHS ABS: 5.3 10*3/uL — AB (ref 0.7–4.0)
Lymphocytes Relative: 34 %
MCH: 31.8 pg (ref 26.0–34.0)
MCHC: 35.1 g/dL (ref 30.0–36.0)
MCV: 90.5 fL (ref 78.0–100.0)
Monocytes Absolute: 1.6 10*3/uL — ABNORMAL HIGH (ref 0.1–1.0)
Monocytes Relative: 10 %
NEUTROS ABS: 8 10*3/uL — AB (ref 1.7–7.7)
NEUTROS PCT: 51 %
Platelets: 365 10*3/uL (ref 150–400)
RBC: 2.74 MIL/uL — AB (ref 4.22–5.81)
RDW: 18.7 % — ABNORMAL HIGH (ref 11.5–15.5)
WBC: 15.6 10*3/uL — AB (ref 4.0–10.5)

## 2015-04-07 LAB — RETICULOCYTES
RBC.: 2.74 MIL/uL — AB (ref 4.22–5.81)
RETIC CT PCT: 13.1 % — AB (ref 0.4–3.1)
Retic Count, Absolute: 358.9 10*3/uL — ABNORMAL HIGH (ref 19.0–186.0)

## 2015-04-07 LAB — COMPREHENSIVE METABOLIC PANEL
ALBUMIN: 4.4 g/dL (ref 3.5–5.0)
ALT: 14 U/L — ABNORMAL LOW (ref 17–63)
ANION GAP: 10 (ref 5–15)
AST: 26 U/L (ref 15–41)
Alkaline Phosphatase: 97 U/L (ref 38–126)
BUN: 7 mg/dL (ref 6–20)
CO2: 24 mmol/L (ref 22–32)
Calcium: 9.4 mg/dL (ref 8.9–10.3)
Chloride: 103 mmol/L (ref 101–111)
Creatinine, Ser: 0.58 mg/dL — ABNORMAL LOW (ref 0.61–1.24)
GFR calc Af Amer: >60 mL/min (ref >60)
GFR calc non Af Amer: >60 mL/min (ref >60)
GLUCOSE: 113 mg/dL — AB (ref 65–99)
POTASSIUM: 3.6 mmol/L (ref 3.5–5.1)
SODIUM: 137 mmol/L (ref 135–145)
Total Bilirubin: 2.2 mg/dL — ABNORMAL HIGH (ref 0.3–1.2)
Total Protein: 8.5 g/dL — ABNORMAL HIGH (ref 6.5–8.1)

## 2015-04-07 MED ORDER — PROMETHAZINE HCL 25 MG/ML IJ SOLN
25.0000 mg | Freq: Once | INTRAMUSCULAR | Status: AC
  Administered 2015-04-07: 25 mg via INTRAVENOUS
  Filled 2015-04-07: qty 1

## 2015-04-07 MED ORDER — DEXTROSE-NACL 5-0.45 % IV SOLN
INTRAVENOUS | Status: DC
Start: 2015-04-07 — End: 2015-04-08
  Administered 2015-04-07: 19:00:00 via INTRAVENOUS

## 2015-04-07 MED ORDER — DIPHENHYDRAMINE HCL 50 MG/ML IJ SOLN
25.0000 mg | Freq: Once | INTRAMUSCULAR | Status: AC
  Administered 2015-04-07: 25 mg via INTRAVENOUS
  Filled 2015-04-07: qty 1

## 2015-04-07 MED ORDER — OXYCODONE HCL 5 MG PO TABS
5.0000 mg | ORAL_TABLET | Freq: Once | ORAL | Status: AC
  Administered 2015-04-07: 5 mg via ORAL
  Filled 2015-04-07: qty 1

## 2015-04-07 MED ORDER — HYDROMORPHONE HCL 2 MG/ML IJ SOLN
3.0000 mg | INTRAMUSCULAR | Status: AC
  Administered 2015-04-07 (×3): 3 mg via INTRAVENOUS
  Filled 2015-04-07 (×3): qty 2

## 2015-04-07 MED ORDER — KETOROLAC TROMETHAMINE 30 MG/ML IJ SOLN
30.0000 mg | Freq: Once | INTRAMUSCULAR | Status: AC
  Administered 2015-04-07: 30 mg via INTRAVENOUS
  Filled 2015-04-07: qty 1
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
## 2015-04-07 NOTE — ED Notes (Signed)
Pt requesting more pain medicine. I explained to him that his next dose was not due until 2030 and that I would bring it to him then.

## 2015-04-07 NOTE — Discharge Instructions (Signed)
Please follow up with your doctor tomorrow.    Sickle Cell Anemia, Adult Sickle cell anemia is a condition where your red blood cells are shaped like sickles. Red blood cells carry oxygen through the body. Sickle-shaped red blood cells do not live as long as normal red blood cells. They also clump together and block blood from flowing through the blood vessels. These things prevent the body from getting enough oxygen. Sickle cell anemia causes organ damage and pain. It also increases the risk of infection. HOME CARE  Drink enough fluid to keep your pee (urine) clear or pale yellow. Drink more in hot weather and during exercise.  Do not smoke. Smoking lowers oxygen levels in the blood.  Only take over-the-counter or prescription medicines as told by your doctor.  Take antibiotic medicines as told by your doctor. Make sure you finish them even if you start to feel better.  Take supplements as told by your doctor.  Consider wearing a medical alert bracelet. This tells anyone caring for you in an emergency of your condition.  When traveling, keep your medical information, doctors' names, and the medicines you take with you at all times.  If you have a fever, do not take fever medicines right away. This could cover up a problem. Tell your doctor.   Keep all follow-up visits with your doctor. Sickle cell anemia requires regular medical care. GET HELP IF: You have a fever. GET HELP RIGHT AWAY IF:  You feel dizzy or faint.  You have new belly (abdominal) pain, especially on the left side near the stomach area.  You have a lasting, often uncomfortable and painful erection of the penis (priapism). If it is not treated right away, you will become unable to have sex (impotence).  You have numbness in your arms or legs or you have a hard time moving them.  You have a hard time talking.  You have a fever or lasting symptoms for more than 2-3 days.  You have a fever and your symptoms  suddenly get worse.  You have signs or symptoms of infection. These include:  Chills.  Being more tired than normal (lethargy).  Irritability.  Poor eating.  Throwing up (vomiting).  You have pain that is not helped with medicine.  You have shortness of breath.  You have pain in your chest.  You are coughing up pus-like or bloody mucus.  You have a stiff neck.  Your feet or hands swell or have pain.  Your belly looks bloated.  Your joints hurt. MAKE SURE YOU:  Understand these instructions.  Will watch your condition.  Will get help right away if you are not doing well or get worse.   This information is not intended to replace advice given to you by your health care provider. Make sure you discuss any questions you have with your health care provider.   Document Released: 11/16/2012 Document Revised: 06/12/2014 Document Reviewed: 11/16/2012 Elsevier Interactive Patient Education [employer]

## 2015-04-07 NOTE — ED Notes (Signed)
Patient presents for [facility] c/o back and chest pain x2-3 days. Denies other c/c.

## 2015-04-07 NOTE — ED Notes (Signed)
  Patient presents for complaints of sickle cell pain crisis with generalized pain in arms, legs, and back. Last seen in this ED yesterday.

## 2015-04-09 ENCOUNTER — Ambulatory Visit: Payer: Medicaid - Out of State | Admitting: Internal Medicine

## 2015-04-12 ENCOUNTER — Emergency Department (HOSPITAL_COMMUNITY): Payer: Medicaid - Out of State

## 2015-04-12 ENCOUNTER — Emergency Department (HOSPITAL_COMMUNITY)
Admission: EM | Admit: 2015-04-12 | Discharge: 2015-04-12 | Discharge status: Against Medical Advice | Payer: Medicaid - Out of State | Attending: Emergency Medicine | Admitting: Emergency Medicine

## 2015-04-12 ENCOUNTER — Encounter (HOSPITAL_COMMUNITY): Payer: Self-pay

## 2015-04-12 DIAGNOSIS — D5700 Hb-SS disease with crisis, unspecified: Secondary | ICD-10-CM | POA: Insufficient documentation

## 2015-04-12 DIAGNOSIS — Z79899 Other long term (current) drug therapy: Secondary | ICD-10-CM | POA: Insufficient documentation

## 2015-04-12 DIAGNOSIS — F17210 Nicotine dependence, cigarettes, uncomplicated: Secondary | ICD-10-CM | POA: Insufficient documentation

## 2015-04-12 LAB — BASIC METABOLIC PANEL
Anion gap: 9 (ref 5–15)
BUN: 9 mg/dL (ref 6–20)
CHLORIDE: 106 mmol/L (ref 101–111)
CO2: 26 mmol/L (ref 22–32)
Calcium: 9.6 mg/dL (ref 8.9–10.3)
Creatinine, Ser: 0.41 mg/dL — ABNORMAL LOW (ref 0.61–1.24)
GFR calc non Af Amer: >60 mL/min (ref >60)
Glucose, Bld: 89 mg/dL (ref 65–99)
POTASSIUM: 3.7 mmol/L (ref 3.5–5.1)
SODIUM: 141 mmol/L (ref 135–145)

## 2015-04-12 LAB — CBC WITH DIFFERENTIAL/PLATELET
BASOS PCT: 0 %
Basophils Absolute: 0.1 10*3/uL (ref 0.0–0.1)
Eosinophils Absolute: 0.6 10*3/uL (ref 0.0–0.7)
Eosinophils Relative: 4 %
HEMATOCRIT: 24.4 % — AB (ref 39.0–52.0)
Hemoglobin: 8.6 g/dL — ABNORMAL LOW (ref 13.0–17.0)
LYMPHS ABS: 4.4 10*3/uL — AB (ref 0.7–4.0)
Lymphocytes Relative: 28 %
MCH: 31.7 pg (ref 26.0–34.0)
MCHC: 35.2 g/dL (ref 30.0–36.0)
MCV: 90 fL (ref 78.0–100.0)
MONO ABS: 2 10*3/uL — AB (ref 0.1–1.0)
MONOS PCT: 12 %
NEUTROS ABS: 8.9 10*3/uL — AB (ref 1.7–7.7)
NEUTROS PCT: 56 %
Platelets: 415 10*3/uL — ABNORMAL HIGH (ref 150–400)
RBC: 2.71 MIL/uL — ABNORMAL LOW (ref 4.22–5.81)
RDW: 18.3 % — AB (ref 11.5–15.5)
WBC: 16 10*3/uL — AB (ref 4.0–10.5)

## 2015-04-12 LAB — CULTURE, BLOOD (ROUTINE X 2)
Culture: NO GROWTH
Culture: NO GROWTH

## 2015-04-12 LAB — I-STAT TROPONIN, ED: Troponin i, poc: 0 ng/mL (ref 0.00–0.08)

## 2015-04-12 LAB — RETICULOCYTES
RBC.: 2.71 MIL/uL — AB (ref 4.22–5.81)
Retic Count, Absolute: 276.4 10*3/uL — ABNORMAL HIGH (ref 19.0–186.0)
Retic Ct Pct: 10.2 % — ABNORMAL HIGH (ref 0.4–3.1)

## 2015-04-12 MED ORDER — HYDROMORPHONE HCL 2 MG/ML IJ SOLN
3.0000 mg | Freq: Once | INTRAMUSCULAR | Status: AC
  Administered 2015-04-12: 3 mg via INTRAVENOUS
  Filled 2015-04-12: qty 2

## 2015-04-12 MED ORDER — DIPHENHYDRAMINE HCL 50 MG/ML IJ SOLN
25.0000 mg | Freq: Once | INTRAMUSCULAR | Status: AC
  Administered 2015-04-12: 25 mg via INTRAVENOUS
  Filled 2015-04-12: qty 1

## 2015-04-12 MED ORDER — PROMETHAZINE HCL 25 MG/ML IJ SOLN
25.0000 mg | Freq: Once | INTRAMUSCULAR | Status: AC
  Administered 2015-04-12: 25 mg via INTRAVENOUS
  Filled 2015-04-12: qty 1

## 2015-04-12 MED ORDER — DEXTROSE-NACL 5-0.45 % IV SOLN
INTRAVENOUS | Status: DC
  Administered 2015-04-12: 20:00:00 via INTRAVENOUS

## 2015-04-12 MED ORDER — KETOROLAC TROMETHAMINE 30 MG/ML IJ SOLN
30.0000 mg | Freq: Once | INTRAMUSCULAR | Status: AC
  Administered 2015-04-12: 30 mg via INTRAVENOUS
  Filled 2015-04-12: qty 1

## 2015-04-12 NOTE — ED Notes (Signed)
Pt. Left AMA, PIV pulled out left at the bedside table , no traces of blood.

## 2015-04-12 NOTE — ED Notes (Signed)
Pt presents with c/o sickle cell pain. Pt reports his pain is in his back and chest. Pt reports the pain started yesterday, ambulatory to triage.
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
## 2015-04-13 ENCOUNTER — Encounter (HOSPITAL_COMMUNITY): Payer: Self-pay | Admitting: *Deleted

## 2015-04-13 ENCOUNTER — Emergency Department (HOSPITAL_COMMUNITY)
Admission: EM | Admit: 2015-04-13 | Discharge: 2015-04-13 | Disposition: A | Discharge status: Home / Self Care | Payer: Medicaid - Out of State | Attending: Emergency Medicine | Admitting: Emergency Medicine

## 2015-04-13 DIAGNOSIS — Z79899 Other long term (current) drug therapy: Secondary | ICD-10-CM | POA: Insufficient documentation

## 2015-04-13 DIAGNOSIS — Z79891 Long term (current) use of opiate analgesic: Secondary | ICD-10-CM | POA: Insufficient documentation

## 2015-04-13 DIAGNOSIS — D5700 Hb-SS disease with crisis, unspecified: Secondary | ICD-10-CM | POA: Insufficient documentation

## 2015-04-13 DIAGNOSIS — F17210 Nicotine dependence, cigarettes, uncomplicated: Secondary | ICD-10-CM | POA: Insufficient documentation

## 2015-04-13 DIAGNOSIS — G8929 Other chronic pain: Secondary | ICD-10-CM | POA: Insufficient documentation

## 2015-04-13 MED ORDER — DIPHENHYDRAMINE HCL 50 MG/ML IJ SOLN
12.5000 mg | Freq: Once | INTRAMUSCULAR | Status: AC
  Administered 2015-04-13: 12.5 mg via INTRAVENOUS
  Filled 2015-04-13: qty 1

## 2015-04-13 MED ORDER — HYDROMORPHONE HCL 2 MG/ML IJ SOLN
3.0000 mg | INTRAMUSCULAR | Status: AC | PRN
  Administered 2015-04-13 (×3): 3 mg via INTRAVENOUS
  Filled 2015-04-13 (×3): qty 2

## 2015-04-13 MED ORDER — PROMETHAZINE HCL 25 MG/ML IJ SOLN
25.0000 mg | Freq: Once | INTRAMUSCULAR | Status: AC
  Administered 2015-04-13: 25 mg via INTRAVENOUS
  Filled 2015-04-13: qty 1

## 2015-04-13 MED ORDER — KETOROLAC TROMETHAMINE 30 MG/ML IJ SOLN
30.0000 mg | Freq: Once | INTRAMUSCULAR | Status: AC
  Administered 2015-04-13: 30 mg via INTRAVENOUS
  Filled 2015-04-13: qty 1

## 2015-04-13 MED ORDER — DEXTROSE-NACL 5-0.45 % IV SOLN
INTRAVENOUS | Status: DC
  Administered 2015-04-13: 20:00:00 via INTRAVENOUS
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
## 2015-04-13 NOTE — Discharge Instructions (Signed)
Sickle Cell Anemia, Adult Sickle cell anemia is a condition in which red blood cells have an abnormal "sickle" shape. This abnormal shape shortens the cells' life span, which results in a lower than normal concentration of red blood cells in the blood. The sickle shape also causes the cells to clump together and block free blood flow through the blood vessels. As a result, the tissues and organs of the body do not receive enough oxygen. Sickle cell anemia causes organ damage and pain and increases the risk of infection. CAUSES  Sickle cell anemia is a genetic disorder. Those who receive two copies of the gene have the condition, and those who receive one copy have the trait. RISK FACTORS The sickle cell gene is most common in people whose families originated in [place]. Other areas of the globe where sickle cell trait occurs include the Mediterranean, [place] and [place], the [place], and the [place].  SIGNS AND SYMPTOMS  Pain, especially in the extremities, back, chest, or abdomen (common). The pain may start suddenly or may develop following an illness, especially if there is dehydration. Pain can also occur due to overexertion or exposure to extreme temperature changes.  Frequent severe bacterial infections, especially certain types of pneumonia and meningitis.  Pain and swelling in the hands and feet.  Decreased activity.   Loss of appetite.   Change in behavior.  Headaches.  Seizures.  Shortness of breath or difficulty breathing.  Vision changes.  Skin ulcers. Those with the trait may not have symptoms or they may have mild symptoms.  DIAGNOSIS  Sickle cell anemia is diagnosed with blood tests that demonstrate the genetic trait. It is often diagnosed during the newborn period, due to mandatory testing nationwide. A variety of blood tests, X-rays, CT scans, MRI scans, ultrasounds, and lung function tests may also be done to monitor the condition. TREATMENT  Sickle  cell anemia may be treated with:  Medicines. You may be given pain medicines, antibiotic medicines (to treat and prevent infections) or medicines to increase the production of certain types of hemoglobin.  Fluids.  Oxygen.  Blood transfusions. HOME CARE INSTRUCTIONS   Drink enough fluid to keep your urine clear or pale yellow. Increase your fluid intake in hot weather and during exercise.  Do not smoke. Smoking lowers oxygen levels in the blood.   Only take over-the-counter or prescription medicines for pain, fever, or discomfort as directed by your health care provider.  Take antibiotics as directed by your health care provider. Make sure you finish them it even if you start to feel better.   Take supplements as directed by your health care provider.   Consider wearing a medical alert bracelet. This tells anyone caring for you in an emergency of your condition.   When traveling, keep your medical information, health care provider's names, and the medicines you take with you at all times.   If you develop a fever, do not take medicines to reduce the fever right away. This could cover up a problem that is developing. Notify your health care provider.  Keep all follow-up appointments with your health care provider. Sickle cell anemia requires regular medical care. SEEK MEDICAL CARE IF: You have a fever. SEEK IMMEDIATE MEDICAL CARE IF:   You feel dizzy or faint.   You have new abdominal pain, especially on the left side near the stomach area.   You develop a persistent, often uncomfortable and painful penile erection (priapism). If this is not treated immediately it   will lead to impotence.   You have numbness your arms or legs or you have a hard time moving them.   You have a hard time with speech.   You have a fever or persistent symptoms for more than 2-3 days.   You have a fever and your symptoms suddenly get worse.   You have signs or symptoms of infection.  These include:   Chills.   Abnormal tiredness (lethargy).   Irritability.   Poor eating.   Vomiting.   You develop pain that is not helped with medicine.   You develop shortness of breath.  You have pain in your chest.   You are coughing up pus-like or bloody sputum.   You develop a stiff neck.  Your feet or hands swell or have pain.  Your abdomen appears bloated.  You develop joint pain. MAKE SURE YOU:  Understand these instructions.   This information is not intended to replace advice given to you by your health care provider. Make sure you discuss any questions you have with your health care provider.   Document Released: 05/06/2005 Document Revised: 02/16/2014 Document Reviewed: 09/07/2012 Elsevier Interactive Patient Education 2016 Elsevier Inc.  

## 2015-04-13 NOTE — ED Notes (Signed)
Pt has back pain, chest pain, arm pain  7/10  States from sickle cell crisis

## 2015-04-14 ENCOUNTER — Emergency Department (HOSPITAL_COMMUNITY)
Admission: EM | Admit: 2015-04-14 | Discharge: 2015-04-14 | Discharge status: Against Medical Advice | Payer: Medicaid - Out of State | Attending: Emergency Medicine | Admitting: Emergency Medicine

## 2015-04-14 ENCOUNTER — Emergency Department (HOSPITAL_COMMUNITY)
Admission: EM | Admit: 2015-04-14 | Discharge: 2015-04-14 | Disposition: A | Discharge status: Home / Self Care | Payer: Medicaid - Out of State | Source: Home / Self Care | Attending: Emergency Medicine | Admitting: Emergency Medicine

## 2015-04-14 ENCOUNTER — Encounter (HOSPITAL_COMMUNITY): Payer: Self-pay | Admitting: *Deleted

## 2015-04-14 ENCOUNTER — Encounter (HOSPITAL_COMMUNITY): Payer: Self-pay

## 2015-04-14 ENCOUNTER — Emergency Department (HOSPITAL_COMMUNITY): Payer: Medicaid - Out of State

## 2015-04-14 DIAGNOSIS — R011 Cardiac murmur, unspecified: Secondary | ICD-10-CM | POA: Diagnosis not present

## 2015-04-14 DIAGNOSIS — D5700 Hb-SS disease with crisis, unspecified: Secondary | ICD-10-CM | POA: Diagnosis present

## 2015-04-14 DIAGNOSIS — G8929 Other chronic pain: Secondary | ICD-10-CM | POA: Insufficient documentation

## 2015-04-14 DIAGNOSIS — Z79899 Other long term (current) drug therapy: Secondary | ICD-10-CM

## 2015-04-14 DIAGNOSIS — F17210 Nicotine dependence, cigarettes, uncomplicated: Secondary | ICD-10-CM | POA: Insufficient documentation

## 2015-04-14 LAB — CBC WITH DIFFERENTIAL/PLATELET
BASOS PCT: 0 %
Basophils Absolute: 0 10*3/uL (ref 0.0–0.1)
EOS ABS: 0.7 10*3/uL (ref 0.0–0.7)
Eosinophils Relative: 5 %
HCT: 23.5 % — ABNORMAL LOW (ref 39.0–52.0)
HEMOGLOBIN: 8.3 g/dL — AB (ref 13.0–17.0)
LYMPHS ABS: 3.5 10*3/uL (ref 0.7–4.0)
Lymphocytes Relative: 24 %
MCH: 31.9 pg (ref 26.0–34.0)
MCHC: 35.3 g/dL (ref 30.0–36.0)
MCV: 90.4 fL (ref 78.0–100.0)
MONO ABS: 1.6 10*3/uL — AB (ref 0.1–1.0)
MONOS PCT: 11 %
NEUTROS ABS: 8.6 10*3/uL — AB (ref 1.7–7.7)
Neutrophils Relative %: 60 %
Platelets: 393 10*3/uL (ref 150–400)
RBC: 2.6 MIL/uL — ABNORMAL LOW (ref 4.22–5.81)
RDW: 18.3 % — AB (ref 11.5–15.5)
WBC: 14.3 10*3/uL — ABNORMAL HIGH (ref 4.0–10.5)

## 2015-04-14 LAB — COMPREHENSIVE METABOLIC PANEL
ALBUMIN: 4.1 g/dL (ref 3.5–5.0)
ALK PHOS: 94 U/L (ref 38–126)
ALT: 13 U/L — ABNORMAL LOW (ref 17–63)
ANION GAP: 7 (ref 5–15)
AST: 27 U/L (ref 15–41)
BILIRUBIN TOTAL: 2.4 mg/dL — AB (ref 0.3–1.2)
BUN: 7 mg/dL (ref 6–20)
CALCIUM: 9 mg/dL (ref 8.9–10.3)
CO2: 25 mmol/L (ref 22–32)
Chloride: 107 mmol/L (ref 101–111)
Creatinine, Ser: 0.52 mg/dL — ABNORMAL LOW (ref 0.61–1.24)
GFR calc non Af Amer: >60 mL/min (ref >60)
GLUCOSE: 87 mg/dL (ref 65–99)
POTASSIUM: 4 mmol/L (ref 3.5–5.1)
SODIUM: 139 mmol/L (ref 135–145)
TOTAL PROTEIN: 7.7 g/dL (ref 6.5–8.1)

## 2015-04-14 LAB — RETICULOCYTES
RBC.: 2.6 MIL/uL — AB (ref 4.22–5.81)
RETIC COUNT ABSOLUTE: 296.4 10*3/uL — AB (ref 19.0–186.0)
Retic Ct Pct: 11.4 % — ABNORMAL HIGH (ref 0.4–3.1)

## 2015-04-14 MED ORDER — PROMETHAZINE HCL 25 MG PO TABS
25.0000 mg | ORAL_TABLET | ORAL | Status: DC | PRN
  Administered 2015-04-14: 25 mg via ORAL
  Filled 2015-04-14: qty 1

## 2015-04-14 MED ORDER — HYDROMORPHONE HCL 2 MG/ML IJ SOLN: 3.0000 mg | INTRAMUSCULAR | Status: DC

## 2015-04-14 MED ORDER — DEXTROSE 5 % IV SOLN: Freq: Once | INTRAVENOUS | Status: DC

## 2015-04-14 MED ORDER — PROMETHAZINE HCL 25 MG PO TABS: 25.0000 mg | ORAL_TABLET | Freq: Once | ORAL | Status: DC

## 2015-04-14 MED ORDER — HYDROMORPHONE HCL 2 MG/ML IJ SOLN: 3.0000 mg | INTRAMUSCULAR | Status: DC | PRN

## 2015-04-14 MED ORDER — OXYCODONE HCL 5 MG PO TABS
10.0000 mg | ORAL_TABLET | Freq: Once | ORAL | Status: AC
  Administered 2015-04-14: 10 mg via ORAL
  Filled 2015-04-14: qty 2

## 2015-04-14 MED ORDER — PROMETHAZINE HCL 25 MG/ML IJ SOLN
25.0000 mg | Freq: Once | INTRAMUSCULAR | Status: DC
  Filled 2015-04-14: qty 1

## 2015-04-14 MED ORDER — DIPHENHYDRAMINE HCL 50 MG/ML IJ SOLN
25.0000 mg | Freq: Once | INTRAMUSCULAR | Status: AC
  Administered 2015-04-14: 25 mg via INTRAVENOUS
  Filled 2015-04-14: qty 1

## 2015-04-14 MED ORDER — HYDROMORPHONE HCL 2 MG/ML IJ SOLN
3.0000 mg | INTRAMUSCULAR | Status: DC | PRN
  Administered 2015-04-14 (×2): 3 mg via INTRAVENOUS
  Filled 2015-04-14 (×2): qty 2

## 2015-04-14 MED ORDER — KETOROLAC TROMETHAMINE 60 MG/2ML IM SOLN
60.0000 mg | Freq: Once | INTRAMUSCULAR | Status: DC
  Filled 2015-04-14: qty 2

## 2015-04-14 MED ORDER — DEXTROSE-NACL 5-0.45 % IV SOLN
INTRAVENOUS | Status: DC
  Administered 2015-04-14: 09:00:00 via INTRAVENOUS

## 2015-04-14 NOTE — ED Notes (Signed)
Pt is sleeping at this time.  Will administer medication when pt wakes up as well as inform Tiffany, PA.

## 2015-04-14 NOTE — ED Notes (Signed)
Pt states he has been having sickle cell pain "back and legs" for about 4 days, reports he took oxy 15 about 1 hour ago without relief.

## 2015-04-14 NOTE — ED Notes (Addendum)
Patient c/o leg and back pain consistent with his symptoms of sickle cell crisis - onset 3 days ago.  Patient has been seen here for same symptoms three times over the previous three days with no report of improvement.

## 2015-04-14 NOTE — ED Notes (Signed)
Pt has no objective signs of pain.  VS are WNL.  Pt requested the lights to be turned off and has remained quiet with his eyes closed and even respirations.  Tiffany, PA is aware.
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
## 2015-04-14 NOTE — ED Notes (Signed)
Attempted IV x 2 w/o success.  Autumn, CN will attempt when pt returns from x-ray.

## 2015-04-14 NOTE — Discharge Instructions (Signed)
Sickle Cell Anemia, Adult Sickle cell anemia is a condition in which red blood cells have an abnormal "sickle" shape. This abnormal shape shortens the cells' life span, which results in a lower than normal concentration of red blood cells in the blood. The sickle shape also causes the cells to clump together and block free blood flow through the blood vessels. As a result, the tissues and organs of the body do not receive enough oxygen. Sickle cell anemia causes organ damage and pain and increases the risk of infection. CAUSES  Sickle cell anemia is a genetic disorder. Those who receive two copies of the gene have the condition, and those who receive one copy have the trait. RISK FACTORS The sickle cell gene is most common in people whose families originated in [place]. Other areas of the globe where sickle cell trait occurs include the Mediterranean, [place] and [place], the [place], and the [place].  SIGNS AND SYMPTOMS  Pain, especially in the extremities, back, chest, or abdomen (common). The pain may start suddenly or may develop following an illness, especially if there is dehydration. Pain can also occur due to overexertion or exposure to extreme temperature changes.  Frequent severe bacterial infections, especially certain types of pneumonia and meningitis.  Pain and swelling in the hands and feet.  Decreased activity.   Loss of appetite.   Change in behavior.  Headaches.  Seizures.  Shortness of breath or difficulty breathing.  Vision changes.  Skin ulcers. Those with the trait may not have symptoms or they may have mild symptoms.  DIAGNOSIS  Sickle cell anemia is diagnosed with blood tests that demonstrate the genetic trait. It is often diagnosed during the newborn period, due to mandatory testing nationwide. A variety of blood tests, X-rays, CT scans, MRI scans, ultrasounds, and lung function tests may also be done to monitor the condition. TREATMENT  Sickle  cell anemia may be treated with:  Medicines. You may be given pain medicines, antibiotic medicines (to treat and prevent infections) or medicines to increase the production of certain types of hemoglobin.  Fluids.  Oxygen.  Blood transfusions. HOME CARE INSTRUCTIONS   Drink enough fluid to keep your urine clear or pale yellow. Increase your fluid intake in hot weather and during exercise.  Do not smoke. Smoking lowers oxygen levels in the blood.   Only take over-the-counter or prescription medicines for pain, fever, or discomfort as directed by your health care provider.  Take antibiotics as directed by your health care provider. Make sure you finish them it even if you start to feel better.   Take supplements as directed by your health care provider.   Consider wearing a medical alert bracelet. This tells anyone caring for you in an emergency of your condition.   When traveling, keep your medical information, health care provider's names, and the medicines you take with you at all times.   If you develop a fever, do not take medicines to reduce the fever right away. This could cover up a problem that is developing. Notify your health care provider.  Keep all follow-up appointments with your health care provider. Sickle cell anemia requires regular medical care. SEEK MEDICAL CARE IF: You have a fever. SEEK IMMEDIATE MEDICAL CARE IF:   You feel dizzy or faint.   You have new abdominal pain, especially on the left side near the stomach area.   You develop a persistent, often uncomfortable and painful penile erection (priapism). If this is not treated immediately it   will lead to impotence.   You have numbness your arms or legs or you have a hard time moving them.   You have a hard time with speech.   You have a fever or persistent symptoms for more than 2-3 days.   You have a fever and your symptoms suddenly get worse.   You have signs or symptoms of infection.  These include:   Chills.   Abnormal tiredness (lethargy).   Irritability.   Poor eating.   Vomiting.   You develop pain that is not helped with medicine.   You develop shortness of breath.  You have pain in your chest.   You are coughing up pus-like or bloody sputum.   You develop a stiff neck.  Your feet or hands swell or have pain.  Your abdomen appears bloated.  You develop joint pain. MAKE SURE YOU:  Understand these instructions.   This information is not intended to replace advice given to you by your health care provider. Make sure you discuss any questions you have with your health care provider.   Document Released: 05/06/2005 Document Revised: 02/16/2014 Document Reviewed: 09/07/2012 Elsevier Interactive Patient Education 2016 Elsevier Inc.  

## 2015-04-14 NOTE — ED Notes (Signed)
Patient not found in room or immediate vicinity.  IV catheter found laying on bedside table and IV line on floor.

## 2015-04-14 NOTE — ED Notes (Addendum)
Awaiting IV fluids to be brought from materials department.  Patient will be given IV benadryl and phenergan prior to pain medication administration.

## 2015-04-17 ENCOUNTER — Emergency Department (HOSPITAL_COMMUNITY)
Admission: EM | Admit: 2015-04-17 | Discharge: 2015-04-18 | Disposition: A | Discharge status: Home / Self Care | Payer: Medicaid Other | Source: Home / Self Care | Attending: Emergency Medicine | Admitting: Emergency Medicine

## 2015-04-17 ENCOUNTER — Encounter (HOSPITAL_COMMUNITY): Payer: Self-pay | Admitting: Emergency Medicine

## 2015-04-17 DIAGNOSIS — F17210 Nicotine dependence, cigarettes, uncomplicated: Secondary | ICD-10-CM | POA: Insufficient documentation

## 2015-04-17 DIAGNOSIS — G8929 Other chronic pain: Secondary | ICD-10-CM | POA: Insufficient documentation

## 2015-04-17 DIAGNOSIS — D5700 Hb-SS disease with crisis, unspecified: Secondary | ICD-10-CM

## 2015-04-17 DIAGNOSIS — Z79899 Other long term (current) drug therapy: Secondary | ICD-10-CM | POA: Insufficient documentation

## 2015-04-17 NOTE — ED Notes (Signed)
Pt brought in by EMS with c/o sickle cell pain that has increased over the past two days  Pt is c/o pain to multiple sites, arms, legs, back  Pt states he took his hydrocodone at home about 2.5 hrs   Pt is ambulatory with EMS

## 2015-04-18 ENCOUNTER — Emergency Department (HOSPITAL_COMMUNITY)
Admission: EM | Admit: 2015-04-18 | Discharge: 2015-04-19 | Disposition: A | Discharge status: Home / Self Care | Payer: Medicaid Other | Attending: Emergency Medicine | Admitting: Emergency Medicine

## 2015-04-18 DIAGNOSIS — D5700 Hb-SS disease with crisis, unspecified: Secondary | ICD-10-CM

## 2015-04-18 MED ORDER — HYDROMORPHONE HCL 2 MG/ML IJ SOLN
2.0000 mg | Freq: Once | INTRAMUSCULAR | Status: AC
  Administered 2015-04-18: 2 mg via INTRAMUSCULAR
  Filled 2015-04-18: qty 1
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
## 2015-04-18 NOTE — Discharge Instructions (Signed)
Sickle Cell Anemia, Adult [name], see your primary doctor within 3 days for close follow up.  If any symptoms worsen, come back to the ED immediately. Thank you. Sickle cell anemia is a condition where your red blood cells are shaped like sickles. Red blood cells carry oxygen through the body. Sickle-shaped red blood cells do not live as long as normal red blood cells. They also clump together and block blood from flowing through the blood vessels. These things prevent the body from getting enough oxygen. Sickle cell anemia causes organ damage and pain. It also increases the risk of infection. HOME CARE  Drink enough fluid to keep your pee (urine) clear or pale yellow. Drink more in hot weather and during exercise.  Do not smoke. Smoking lowers oxygen levels in the blood.  Only take over-the-counter or prescription medicines as told by your doctor.  Take antibiotic medicines as told by your doctor. Make sure you finish them even if you start to feel better.  Take supplements as told by your doctor.  Consider wearing a medical alert bracelet. This tells anyone caring for you in an emergency of your condition.  When traveling, keep your medical information, doctors' names, and the medicines you take with you at all times.  If you have a fever, do not take fever medicines right away. This could cover up a problem. Tell your doctor.   Keep all follow-up visits with your doctor. Sickle cell anemia requires regular medical care. GET HELP IF: You have a fever. GET HELP RIGHT AWAY IF:  You feel dizzy or faint.  You have new belly (abdominal) pain, especially on the left side near the stomach area.  You have a lasting, often uncomfortable and painful erection of the penis (priapism). If it is not treated right away, you will become unable to have sex (impotence).  You have numbness in your arms or legs or you have a hard time moving them.  You have a hard time talking.  You have a  fever or lasting symptoms for more than 2-3 days.  You have a fever and your symptoms suddenly get worse.  You have signs or symptoms of infection. These include:  Chills.  Being more tired than normal (lethargy).  Irritability.  Poor eating.  Throwing up (vomiting).  You have pain that is not helped with medicine.  You have shortness of breath.  You have pain in your chest.  You are coughing up pus-like or bloody mucus.  You have a stiff neck.  Your feet or hands swell or have pain.  Your belly looks bloated.  Your joints hurt. MAKE SURE YOU:  Understand these instructions.  Will watch your condition.  Will get help right away if you are not doing well or get worse.   This information is not intended to replace advice given to you by your health care provider. Make sure you discuss any questions you have with your health care provider.   Document Released: 11/16/2012 Document Revised: 06/12/2014 Document Reviewed: 11/16/2012 Elsevier Interactive Patient Education [employer]2016 [employer]vier [employer].

## 2015-04-19 ENCOUNTER — Encounter (HOSPITAL_COMMUNITY): Payer: Self-pay | Admitting: *Deleted

## 2015-04-19 ENCOUNTER — Emergency Department (HOSPITAL_COMMUNITY)
Admission: EM | Admit: 2015-04-19 | Discharge: 2015-04-19 | Disposition: A | Discharge status: Home / Self Care | Payer: Medicaid - Out of State | Attending: Emergency Medicine | Admitting: Emergency Medicine

## 2015-04-19 ENCOUNTER — Encounter (HOSPITAL_COMMUNITY): Payer: Self-pay | Admitting: Emergency Medicine

## 2015-04-19 ENCOUNTER — Non-Acute Institutional Stay (HOSPITAL_BASED_OUTPATIENT_CLINIC_OR_DEPARTMENT_OTHER)
Admission: AD | Admit: 2015-04-19 | Discharge: 2015-04-19 | Disposition: A | Discharge status: Home / Self Care | Payer: Medicaid Other | Source: Ambulatory Visit | Attending: Internal Medicine | Admitting: Internal Medicine

## 2015-04-19 ENCOUNTER — Emergency Department (HOSPITAL_COMMUNITY): Payer: Medicaid Other

## 2015-04-19 DIAGNOSIS — F17210 Nicotine dependence, cigarettes, uncomplicated: Secondary | ICD-10-CM | POA: Insufficient documentation

## 2015-04-19 DIAGNOSIS — D5700 Hb-SS disease with crisis, unspecified: Secondary | ICD-10-CM

## 2015-04-19 DIAGNOSIS — D57819 Other sickle-cell disorders with crisis, unspecified: Secondary | ICD-10-CM

## 2015-04-19 DIAGNOSIS — Z79899 Other long term (current) drug therapy: Secondary | ICD-10-CM | POA: Diagnosis not present

## 2015-04-19 LAB — RETICULOCYTES
RBC.: 2.44 MIL/uL — ABNORMAL LOW (ref 4.22–5.81)
RETIC COUNT ABSOLUTE: 368.4 10*3/uL — AB (ref 19.0–186.0)
Retic Ct Pct: 15.1 % — ABNORMAL HIGH (ref 0.4–3.1)

## 2015-04-19 LAB — COMPREHENSIVE METABOLIC PANEL
ALK PHOS: 96 U/L (ref 38–126)
ALT: 11 U/L — AB (ref 17–63)
AST: 24 U/L (ref 15–41)
Albumin: 4.3 g/dL (ref 3.5–5.0)
Anion gap: 7 (ref 5–15)
BILIRUBIN TOTAL: 3.2 mg/dL — AB (ref 0.3–1.2)
BUN: 6 mg/dL (ref 6–20)
CO2: 25 mmol/L (ref 22–32)
Calcium: 9.1 mg/dL (ref 8.9–10.3)
Chloride: 105 mmol/L (ref 101–111)
Creatinine, Ser: 0.51 mg/dL — ABNORMAL LOW (ref 0.61–1.24)
GFR calc Af Amer: >60 mL/min (ref >60)
GLUCOSE: 77 mg/dL (ref 65–99)
Potassium: 3.5 mmol/L (ref 3.5–5.1)
Sodium: 137 mmol/L (ref 135–145)
TOTAL PROTEIN: 8 g/dL (ref 6.5–8.1)

## 2015-04-19 LAB — CBC WITH DIFFERENTIAL/PLATELET
BASOS ABS: 0.1 10*3/uL (ref 0.0–0.1)
Basophils Relative: 1 %
Eosinophils Absolute: 0.7 10*3/uL (ref 0.0–0.7)
Eosinophils Relative: 4 %
HEMATOCRIT: 21.5 % — AB (ref 39.0–52.0)
HEMOGLOBIN: 7.7 g/dL — AB (ref 13.0–17.0)
LYMPHS PCT: 27 %
Lymphs Abs: 4.4 10*3/uL — ABNORMAL HIGH (ref 0.7–4.0)
MCH: 31.6 pg (ref 26.0–34.0)
MCHC: 35.8 g/dL (ref 30.0–36.0)
MCV: 88.1 fL (ref 78.0–100.0)
MONO ABS: 1.7 10*3/uL — AB (ref 0.1–1.0)
Monocytes Relative: 11 %
NEUTROS ABS: 9.3 10*3/uL — AB (ref 1.7–7.7)
NEUTROS PCT: 57 %
Platelets: 321 10*3/uL (ref 150–400)
RBC: 2.44 MIL/uL — AB (ref 4.22–5.81)
RDW: 19.5 % — ABNORMAL HIGH (ref 11.5–15.5)
WBC: 16.1 10*3/uL — ABNORMAL HIGH (ref 4.0–10.5)

## 2015-04-19 MED ORDER — HYDROMORPHONE HCL 2 MG/ML IJ SOLN
2.0000 mg | Freq: Once | INTRAMUSCULAR | Status: AC
  Administered 2015-04-19: 2 mg via INTRAMUSCULAR
  Filled 2015-04-19: qty 1

## 2015-04-19 MED ORDER — HYDROMORPHONE HCL 2 MG/ML IJ SOLN: 3.0000 mg | Freq: Once | INTRAMUSCULAR | Status: DC

## 2015-04-19 MED ORDER — KETOROLAC TROMETHAMINE 30 MG/ML IJ SOLN
30.0000 mg | Freq: Once | INTRAMUSCULAR | Status: DC
  Filled 2015-04-19: qty 1

## 2015-04-19 MED ORDER — DEXTROSE-NACL 5-0.45 % IV SOLN
INTRAVENOUS | Status: DC
  Administered 2015-04-19: 12:00:00 via INTRAVENOUS

## 2015-04-19 MED ORDER — KETOROLAC TROMETHAMINE 60 MG/2ML IM SOLN
60.0000 mg | Freq: Once | INTRAMUSCULAR | Status: AC
  Administered 2015-04-19: 60 mg via INTRAMUSCULAR
  Filled 2015-04-19: qty 2

## 2015-04-19 MED ORDER — HYDROMORPHONE HCL 2 MG/ML IJ SOLN: 1.5000 mg | Freq: Once | INTRAMUSCULAR | Status: DC

## 2015-04-19 MED ORDER — PROMETHAZINE HCL 25 MG/ML IJ SOLN
25.0000 mg | Freq: Once | INTRAMUSCULAR | Status: DC
  Filled 2015-04-19: qty 1

## 2015-04-19 MED ORDER — HYDROMORPHONE HCL 2 MG/ML IJ SOLN
1.0000 mg | Freq: Once | INTRAMUSCULAR | Status: AC
  Administered 2015-04-19: 1 mg via INTRAVENOUS
  Filled 2015-04-19: qty 1

## 2015-04-19 MED ORDER — DIPHENHYDRAMINE HCL 25 MG PO CAPS
50.0000 mg | ORAL_CAPSULE | Freq: Once | ORAL | Status: AC
Start: 2015-04-19 — End: 2015-04-19
  Administered 2015-04-19: 50 mg via ORAL
  Filled 2015-04-19: qty 2

## 2015-04-19 MED ORDER — HYDROMORPHONE HCL 2 MG/ML IJ SOLN
3.0000 mg | Freq: Once | INTRAMUSCULAR | Status: DC
  Filled 2015-04-19: qty 2

## 2015-04-19 MED ORDER — DEXTROSE-NACL 5-0.45 % IV SOLN: INTRAVENOUS | Status: DC

## 2015-04-19 MED ORDER — KETOROLAC TROMETHAMINE 30 MG/ML IJ SOLN
30.0000 mg | Freq: Once | INTRAMUSCULAR | Status: AC
  Administered 2015-04-19: 30 mg via INTRAVENOUS
  Filled 2015-04-19: qty 1

## 2015-04-19 MED ORDER — HYDROMORPHONE HCL 2 MG/ML IJ SOLN
3.0000 mg | Freq: Once | INTRAMUSCULAR | Status: AC
  Administered 2015-04-19: 3 mg via INTRAMUSCULAR
  Filled 2015-04-19: qty 2

## 2015-04-19 MED ORDER — HYDROMORPHONE HCL 2 MG/ML IJ SOLN: 2.0000 mg | Freq: Once | INTRAMUSCULAR | Status: DC

## 2015-04-19 NOTE — ED Notes (Signed)
Pt. c/o generalized pain- mostly in chest- for past three days. No SOB, difficulty ambulating.

## 2015-04-19 NOTE — Progress Notes (Signed)
Patient ID: Matthew Shannon, male   DOB: 08/06/1990, 25 y.o.   MRN: [id number] Discharge instructions given to patient.  [facility] rec[facility] only 1 dose of IV pain medication.  IV removed without difficulty.  Patient ambulatory at discharge.  Will follow up as needed.
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
## 2015-04-19 NOTE — ED Notes (Addendum)
Patient left AMA because PA said he needed to go to sickle cell clinic.    Patient did not allow us to take his vitals.

## 2015-04-19 NOTE — Discharge Instructions (Signed)
Sickle Cell Anemia, Adult Sickle cell anemia is a condition in which red blood cells have an abnormal "sickle" shape. This abnormal shape shortens the cells' life span, which results in a lower than normal concentration of red blood cells in the blood. The sickle shape also causes the cells to clump together and block free blood flow through the blood vessels. As a result, the tissues and organs of the body do not receive enough oxygen. Sickle cell anemia causes organ damage and pain and increases the risk of infection. CAUSES  Sickle cell anemia is a genetic disorder. Those who receive two copies of the gene have the condition, and those who receive one copy have the trait. RISK FACTORS The sickle cell gene is most common in people whose families originated in [place]. Other areas of the globe where sickle cell trait occurs include the Mediterranean, [place] and [place], the [place], and the [place].  SIGNS AND SYMPTOMS  Pain, especially in the extremities, back, chest, or abdomen (common). The pain may start suddenly or may develop following an illness, especially if there is dehydration. Pain can also occur due to overexertion or exposure to extreme temperature changes.  Frequent severe bacterial infections, especially certain types of pneumonia and meningitis.  Pain and swelling in the hands and feet.  Decreased activity.   Loss of appetite.   Change in behavior.  Headaches.  Seizures.  Shortness of breath or difficulty breathing.  Vision changes.  Skin ulcers. Those with the trait may not have symptoms or they may have mild symptoms.  DIAGNOSIS  Sickle cell anemia is diagnosed with blood tests that demonstrate the genetic trait. It is often diagnosed during the newborn period, due to mandatory testing nationwide. A variety of blood tests, X-rays, CT scans, MRI scans, ultrasounds, and lung function tests may also be done to monitor the condition. TREATMENT  Sickle  cell anemia may be treated with:  Medicines. You may be given pain medicines, antibiotic medicines (to treat and prevent infections) or medicines to increase the production of certain types of hemoglobin.  Fluids.  Oxygen.  Blood transfusions. HOME CARE INSTRUCTIONS   Drink enough fluid to keep your urine clear or pale yellow. Increase your fluid intake in hot weather and during exercise.  Do not smoke. Smoking lowers oxygen levels in the blood.   Only take over-the-counter or prescription medicines for pain, fever, or discomfort as directed by your health care provider.  Take antibiotics as directed by your health care provider. Make sure you finish them it even if you start to feel better.   Take supplements as directed by your health care provider.   Consider wearing a medical alert bracelet. This tells anyone caring for you in an emergency of your condition.   When traveling, keep your medical information, health care provider's names, and the medicines you take with you at all times.   If you develop a fever, do not take medicines to reduce the fever right away. This could cover up a problem that is developing. Notify your health care provider.  Keep all follow-up appointments with your health care provider. Sickle cell anemia requires regular medical care. SEEK MEDICAL CARE IF: You have a fever. SEEK IMMEDIATE MEDICAL CARE IF:   You feel dizzy or faint.   You have new abdominal pain, especially on the left side near the stomach area.   You develop a persistent, often uncomfortable and painful penile erection (priapism). If this is not treated immediately it   will lead to impotence.   You have numbness your arms or legs or you have a hard time moving them.   You have a hard time with speech.   You have a fever or persistent symptoms for more than 2-3 days.   You have a fever and your symptoms suddenly get worse.   You have signs or symptoms of infection.  These include:   Chills.   Abnormal tiredness (lethargy).   Irritability.   Poor eating.   Vomiting.   You develop pain that is not helped with medicine.   You develop shortness of breath.  You have pain in your chest.   You are coughing up pus-like or bloody sputum.   You develop a stiff neck.  Your feet or hands swell or have pain.  Your abdomen appears bloated.  You develop joint pain. MAKE SURE YOU:  Understand these instructions.   This information is not intended to replace advice given to you by your health care provider. Make sure you discuss any questions you have with your health care provider.   Document Released: 05/06/2005 Document Revised: 02/16/2014 Document Reviewed: 09/07/2012 Elsevier Interactive Patient Education 2016 Elsevier Inc.  
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
## 2015-04-19 NOTE — Discharge Summary (Signed)
[facility] Discharge Summary   Patient ID: Matthew Shannon MRN: [id number] DOB/AGE: 06-Nov-1990 24 y.o.  Admit date: 04/19/2015 Discharge date: 04/19/2015  Primary Care Physician:  Jeanann Lewandowsky, MD  Admission Diagnoses:  Active Problems:   Sickle-cell disease with pain (HCC)  Discharge Medications:    Medication List    ASK your doctor about these medications        folic acid 1 MG tablet  Commonly known as:  FOLVITE  Take 1 tablet (1 mg total) by mouth daily.     HYDROmorphone 4 MG tablet  Commonly known as:  DILAUDID  Take 1 tablet (4 mg total) by mouth every 4 (four) hours.     hydroxyurea 500 MG capsule  Commonly known as:  HYDREA  Take 2 capsules (1,000 mg total) by mouth daily. May take with food to minimize GI side effects.     ibuprofen 600 MG tablet  Commonly known as:  ADVIL,MOTRIN  Take 1 tablet (600 mg total) by mouth every 8 (eight) hours as needed.     morphine 30 MG 12 hr tablet  Commonly known as:  MS CONTIN  Take 1 tablet (30 mg total) by mouth every 12 (twelve) hours.     nicotine 14 mg/24hr patch  Commonly known as:  NICODERM CQ  Place 1 patch (14 mg total) onto the skin daily.     ondansetron 8 MG tablet  Commonly known as:  ZOFRAN  Take 1 tablet (8 mg total) by mouth every 8 (eight) hours as needed for nausea or vomiting.     oxyCODONE 15 MG immediate release tablet  Commonly known as:  ROXICODONE  Take 1 tablet (15 mg total) by mouth every 4 (four) hours as needed for pain.         Consults:  None  Significant Diagnostic Studies:  Dg Chest 2 View  04/14/2015  CLINICAL DATA:  Initial evaluation for acute right-sided chest pain for 4 days. EXAM: CHEST  2 VIEW COMPARISON:  Prior study from 04/12/2015. FINDINGS: Cardiac and mediastinal silhouettes are stable in size and contour, and remain within normal limits. Lungs are normally inflated. Mild diffuse coarsening of the interstitial markings similar to prior. No  consolidative airspace disease. No pulmonary edema or pleural effusion. Linear opacity at the right costophrenic angle favored to reflect atelectasis/ scarring. No pneumothorax. No acute osseous abnormality. Changes related to sickle cell disease present within the vertebral bodies. IMPRESSION: 1. No active cardiopulmonary disease. 2. Mild diffuse chronic coarsening of the interstitial markings, similar to prior. 3. Minimal linear opacity at the right costophrenic angle, favored to reflect atelectasis/scarring, also similar. Electronically Signed   By: Rise Mu M.D.   On: 04/14/2015 04:25   Dg Chest 2 View  04/12/2015  CLINICAL DATA:  Sickle-cell patent.  Smoker. EXAM: CHEST  2 VIEW COMPARISON:  04/06/2015 FINDINGS: Midline trachea. Normal heart size and mediastinal contours. No pleural effusion or pneumothorax. Lower lobe predominant interstitial thickening is chronic and nonspecific. No lobar consolidation. No congestive failure. IMPRESSION: No acute cardiopulmonary disease. Electronically Signed   By: Jeronimo Greaves M.D.   On: 04/12/2015 20:00   Dg Chest 2 View  04/06/2015  CLINICAL DATA:  Upper and lower back pain. Smoker. Shortness of breath. Sickle cell patient. EXAM: CHEST  2 VIEW COMPARISON:  03/26/2015 FINDINGS: Mildly increased markings throughout the lungs. No confluent airspace opacities or effusions. Heart is borderline in size. No acute bony abnormality. Rightward scoliosis in the mid thoracic spine. IMPRESSION:  Mild increased markings throughout the lungs.  No acute findings. Electronically Signed   By: Charlett NoseKevin  Dover M.D.   On: 04/06/2015 17:14   Dg Chest 2 View  03/26/2015  CLINICAL DATA:  25 year old male with left-sided chest pain and shortness of breath EXAM: CHEST  2 VIEW COMPARISON:  Prior chest x-ray 02/27/2015 FINDINGS: The lungs are clear and negative for focal airspace consolidation, pulmonary edema or suspicious pulmonary nodule. Mild chronic bronchitic changes. No pleural  effusion or pneumothorax. Cardiac and mediastinal contours are within normal limits. No acute fracture or lytic or blastic osseous lesions. The visualized upper abdominal bowel gas pattern is unremarkable. Dextro convex scoliosis of the lower thoracic spine centered at T9. Surgical clips in the right upper quadrant suggest prior cholecystectomy. IMPRESSION: Stable chest x-ray without evidence of active cardiopulmonary process. Electronically Signed   By: Malachy MoanHeath  McCullough M.D.   On: 03/26/2015 16:42   Dg Chest Port 1 View  04/19/2015  CLINICAL DATA:  Chest pain for 3 days. History of sickle cell. Smoker. EXAM: PORTABLE CHEST 1 VIEW COMPARISON:  04/14/2015 FINDINGS: Heart size and pulmonary vascularity are normal. Patchy interstitial changes in the lungs similar to previous studies. No focal consolidation or airspace disease. No blunting of costophrenic angles. No pneumothorax. Thoracolumbar scoliosis convex towards the right. IMPRESSION: No active disease. Electronically Signed   By: Burman NievesWilliam  Stevens M.D.   On: 04/19/2015 02:49     [facility] Course: Matthew Shannon was admitted for extended observation.  Patient received 1 dose of dilaudid and stated that he has to leave. He states that pain is 6/10. I advised patient that he needed to stay, but he insists on leaving. He was discharged in stable condition.   Physical Exam at Discharge:  BP 103/57 mmHg  Pulse 69  Temp(Src) 97.6 F (36.4 C) (Oral)  Resp 16  Ht 5\' 10"  (1.778 m)  Wt 135 lb (61.236 kg)  BMI 19.37 kg/m2  SpO2 98%  Disposition at Discharge: 01-Home or Self Care  Discharge Orders:   Condition at Discharge:   Stable  Time spent on Discharge:  10 minutes  Signed: Craig Ionescu M 04/19/2015, 12:23 PM

## 2015-04-19 NOTE — ED Notes (Signed)
Pt reports continuing sickle cell pain for the past 3 days. Was here last night for same. Continuing pain in back radiating to chest. Symptoms typical for crisis.

## 2015-04-19 NOTE — ED Notes (Signed)
Pt declined EKG, stating "I cant lift my arm it hurts".

## 2015-04-20 ENCOUNTER — Emergency Department (HOSPITAL_COMMUNITY)
Admission: EM | Admit: 2015-04-20 | Discharge: 2015-04-21 | Disposition: A | Discharge status: Home / Self Care | Payer: Medicaid Other | Attending: Emergency Medicine | Admitting: Emergency Medicine

## 2015-04-20 ENCOUNTER — Encounter (HOSPITAL_COMMUNITY): Payer: Self-pay | Admitting: Emergency Medicine

## 2015-04-20 ENCOUNTER — Emergency Department (HOSPITAL_COMMUNITY): Payer: Medicaid Other

## 2015-04-20 DIAGNOSIS — Z79891 Long term (current) use of opiate analgesic: Secondary | ICD-10-CM | POA: Diagnosis not present

## 2015-04-20 DIAGNOSIS — Z79899 Other long term (current) drug therapy: Secondary | ICD-10-CM | POA: Diagnosis not present

## 2015-04-20 DIAGNOSIS — D5700 Hb-SS disease with crisis, unspecified: Secondary | ICD-10-CM | POA: Diagnosis not present

## 2015-04-20 DIAGNOSIS — F17210 Nicotine dependence, cigarettes, uncomplicated: Secondary | ICD-10-CM | POA: Insufficient documentation

## 2015-04-20 LAB — COMPREHENSIVE METABOLIC PANEL
ALBUMIN: 4.6 g/dL (ref 3.5–5.0)
ALK PHOS: 87 U/L (ref 38–126)
ALT: 13 U/L — ABNORMAL LOW (ref 17–63)
ANION GAP: 9 (ref 5–15)
AST: 25 U/L (ref 15–41)
BILIRUBIN TOTAL: 4 mg/dL — AB (ref 0.3–1.2)
BUN: 11 mg/dL (ref 6–20)
CALCIUM: 9.4 mg/dL (ref 8.9–10.3)
CO2: 26 mmol/L (ref 22–32)
Chloride: 105 mmol/L (ref 101–111)
Creatinine, Ser: 0.59 mg/dL — ABNORMAL LOW (ref 0.61–1.24)
GLUCOSE: 88 mg/dL (ref 65–99)
POTASSIUM: 3.8 mmol/L (ref 3.5–5.1)
SODIUM: 140 mmol/L (ref 135–145)
TOTAL PROTEIN: 8.1 g/dL (ref 6.5–8.1)

## 2015-04-20 LAB — CBC WITH DIFFERENTIAL/PLATELET
BASOS ABS: 0.1 10*3/uL (ref 0.0–0.1)
BASOS PCT: 1 %
Eosinophils Absolute: 0.7 10*3/uL (ref 0.0–0.7)
Eosinophils Relative: 4 %
HEMATOCRIT: 21.7 % — AB (ref 39.0–52.0)
Hemoglobin: 7.8 g/dL — ABNORMAL LOW (ref 13.0–17.0)
Lymphocytes Relative: 29 %
Lymphs Abs: 5.6 10*3/uL — ABNORMAL HIGH (ref 0.7–4.0)
MCH: 31.6 pg (ref 26.0–34.0)
MCHC: 35.9 g/dL (ref 30.0–36.0)
MCV: 87.9 fL (ref 78.0–100.0)
MONO ABS: 1.8 10*3/uL — AB (ref 0.1–1.0)
Monocytes Relative: 9 %
NEUTROS ABS: 11.3 10*3/uL — AB (ref 1.7–7.7)
NEUTROS PCT: 57 %
Platelets: 319 10*3/uL (ref 150–400)
RBC: 2.47 MIL/uL — AB (ref 4.22–5.81)
RDW: 20.1 % — AB (ref 11.5–15.5)
WBC: 19.5 10*3/uL — AB (ref 4.0–10.5)

## 2015-04-20 LAB — RETICULOCYTES
RBC.: 2.49 MIL/uL — AB (ref 4.22–5.81)
Retic Count, Absolute: 343.6 10*3/uL — ABNORMAL HIGH (ref 19.0–186.0)
Retic Ct Pct: 13.8 % — ABNORMAL HIGH (ref 0.4–3.1)

## 2015-04-20 MED ORDER — ONDANSETRON HCL 4 MG/2ML IJ SOLN
INTRAMUSCULAR | Status: AC
  Administered 2015-04-20: 4 mg via INTRAVENOUS
  Filled 2015-04-20: qty 2

## 2015-04-20 MED ORDER — ONDANSETRON HCL 4 MG/2ML IJ SOLN
4.0000 mg | Freq: Once | INTRAMUSCULAR | Status: AC
  Administered 2015-04-20: 4 mg via INTRAVENOUS

## 2015-04-20 MED ORDER — DIPHENHYDRAMINE HCL 50 MG/ML IJ SOLN
25.0000 mg | Freq: Once | INTRAMUSCULAR | Status: AC
  Administered 2015-04-20: 25 mg via INTRAVENOUS
  Filled 2015-04-20: qty 1

## 2015-04-20 MED ORDER — HYDROMORPHONE HCL 2 MG/ML IJ SOLN
3.0000 mg | INTRAMUSCULAR | Status: AC | PRN
  Administered 2015-04-20 – 2015-04-21 (×3): 3 mg via INTRAVENOUS
  Filled 2015-04-20 (×3): qty 2

## 2015-04-20 MED ORDER — SODIUM CHLORIDE 0.45 % IV SOLN
INTRAVENOUS | Status: DC
  Administered 2015-04-21: via INTRAVENOUS
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
## 2015-04-20 NOTE — ED Notes (Signed)
Attempted to draw pt blood pt is requesting to be stuck once with IV RN  Made aware

## 2015-04-20 NOTE — ED Notes (Signed)
Pt was seen yesterday for Sickle Cell related pain. Pt states he went there, but his main was not managed. Pt states he has had pain x 3 days in his chest, legs,  and back r/t his sickle cell disease.

## 2015-04-20 NOTE — ED Notes (Signed)
Patient requested to wait on lab draw until in a treatment room.

## 2015-04-21 ENCOUNTER — Emergency Department (HOSPITAL_COMMUNITY)
Admission: EM | Admit: 2015-04-21 | Discharge: 2015-04-21 | Discharge status: Against Medical Advice | Payer: Medicaid Other | Source: Home / Self Care | Attending: Emergency Medicine | Admitting: Emergency Medicine

## 2015-04-21 ENCOUNTER — Encounter (HOSPITAL_COMMUNITY): Payer: Self-pay | Admitting: Family Medicine

## 2015-04-21 DIAGNOSIS — F17210 Nicotine dependence, cigarettes, uncomplicated: Secondary | ICD-10-CM

## 2015-04-21 DIAGNOSIS — Z79899 Other long term (current) drug therapy: Secondary | ICD-10-CM

## 2015-04-21 DIAGNOSIS — Z79891 Long term (current) use of opiate analgesic: Secondary | ICD-10-CM

## 2015-04-21 DIAGNOSIS — D5700 Hb-SS disease with crisis, unspecified: Secondary | ICD-10-CM

## 2015-04-21 LAB — CBC
HCT: 22.9 % — ABNORMAL LOW (ref 39.0–52.0)
Hemoglobin: 8.2 g/dL — ABNORMAL LOW (ref 13.0–17.0)
MCH: 31.2 pg (ref 26.0–34.0)
MCHC: 35.8 g/dL (ref 30.0–36.0)
MCV: 87.1 fL (ref 78.0–100.0)
Platelets: 330 10*3/uL (ref 150–400)
RBC: 2.63 MIL/uL — AB (ref 4.22–5.81)
RDW: 20.9 % — ABNORMAL HIGH (ref 11.5–15.5)
WBC: 18.1 10*3/uL — ABNORMAL HIGH (ref 4.0–10.5)

## 2015-04-21 LAB — COMPREHENSIVE METABOLIC PANEL
ALK PHOS: 98 U/L (ref 38–126)
ALT: 13 U/L — ABNORMAL LOW (ref 17–63)
ANION GAP: 9 (ref 5–15)
AST: 31 U/L (ref 15–41)
Albumin: 4.6 g/dL (ref 3.5–5.0)
BILIRUBIN TOTAL: 4.6 mg/dL — AB (ref 0.3–1.2)
BUN: 9 mg/dL (ref 6–20)
CALCIUM: 9.5 mg/dL (ref 8.9–10.3)
CO2: 26 mmol/L (ref 22–32)
Chloride: 108 mmol/L (ref 101–111)
Creatinine, Ser: 0.57 mg/dL — ABNORMAL LOW (ref 0.61–1.24)
GFR calc non Af Amer: >60 mL/min (ref >60)
Glucose, Bld: 84 mg/dL (ref 65–99)
Potassium: 4.4 mmol/L (ref 3.5–5.1)
Sodium: 143 mmol/L (ref 135–145)
TOTAL PROTEIN: 8.3 g/dL — AB (ref 6.5–8.1)

## 2015-04-21 LAB — RETICULOCYTES
RBC.: 2.63 MIL/uL — ABNORMAL LOW (ref 4.22–5.81)
RETIC CT PCT: 14.5 % — AB (ref 0.4–3.1)
Retic Count, Absolute: 381.4 10*3/uL — ABNORMAL HIGH (ref 19.0–186.0)

## 2015-04-21 MED ORDER — HYDROMORPHONE HCL 2 MG/ML IJ SOLN
3.0000 mg | Freq: Once | INTRAMUSCULAR | Status: AC
  Administered 2015-04-21: 3 mg via INTRAVENOUS
  Filled 2015-04-21: qty 2

## 2015-04-21 MED ORDER — DIPHENHYDRAMINE HCL 50 MG/ML IJ SOLN
25.0000 mg | Freq: Once | INTRAMUSCULAR | Status: AC
  Administered 2015-04-21: 25 mg via INTRAVENOUS
  Filled 2015-04-21: qty 1

## 2015-04-21 MED ORDER — PROMETHAZINE HCL 25 MG/ML IJ SOLN
25.0000 mg | Freq: Four times a day (QID) | INTRAMUSCULAR | Status: DC | PRN
  Administered 2015-04-21: 25 mg via INTRAVENOUS
  Filled 2015-04-21: qty 1

## 2015-04-21 MED ORDER — DEXTROSE-NACL 5-0.45 % IV SOLN
INTRAVENOUS | Status: DC
  Administered 2015-04-21: 19:00:00 via INTRAVENOUS

## 2015-04-21 MED ORDER — KETOROLAC TROMETHAMINE 30 MG/ML IJ SOLN
30.0000 mg | Freq: Once | INTRAMUSCULAR | Status: AC
  Administered 2015-04-21: 30 mg via INTRAVENOUS
  Filled 2015-04-21: qty 1

## 2015-04-21 MED ORDER — PROMETHAZINE HCL 25 MG/ML IJ SOLN
25.0000 mg | Freq: Once | INTRAMUSCULAR | Status: AC
  Administered 2015-04-21: 25 mg via INTRAVENOUS
  Filled 2015-04-21: qty 1

## 2015-04-21 NOTE — ED Notes (Signed)
Pt reports he is is experiencing sickle cell pain in his chest and back. Symptoms started 3 days ago. Last dose of pain medication was Oxycodone 15 mg 2 hours ago.

## 2015-04-21 NOTE — ED Notes (Signed)
PT DISCHARGED. INSTRUCTIONS GIVEN. AAOX3. PT IN NO APPARENT DISTRESS. THE OPPORTUNITY TO ASK QUESTIONS WAS PROVIDED. 
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
## 2015-04-21 NOTE — Discharge Instructions (Signed)
Sickle Cell Anemia, Adult Sickle cell anemia is a condition in which red blood cells have an abnormal "sickle" shape. This abnormal shape shortens the cells' life span, which results in a lower than normal concentration of red blood cells in the blood. The sickle shape also causes the cells to clump together and block free blood flow through the blood vessels. As a result, the tissues and organs of the body do not receive enough oxygen. Sickle cell anemia causes organ damage and pain and increases the risk of infection. CAUSES  Sickle cell anemia is a genetic disorder. Those who receive two copies of the gene have the condition, and those who receive one copy have the trait. RISK FACTORS The sickle cell gene is most common in people whose families originated in [place]. Other areas of the globe where sickle cell trait occurs include the Mediterranean, [place] and [place], the [place], and the [place].  SIGNS AND SYMPTOMS  Pain, especially in the extremities, back, chest, or abdomen (common). The pain may start suddenly or may develop following an illness, especially if there is dehydration. Pain can also occur due to overexertion or exposure to extreme temperature changes.  Frequent severe bacterial infections, especially certain types of pneumonia and meningitis.  Pain and swelling in the hands and feet.  Decreased activity.   Loss of appetite.   Change in behavior.  Headaches.  Seizures.  Shortness of breath or difficulty breathing.  Vision changes.  Skin ulcers. Those with the trait may not have symptoms or they may have mild symptoms.  DIAGNOSIS  Sickle cell anemia is diagnosed with blood tests that demonstrate the genetic trait. It is often diagnosed during the newborn period, due to mandatory testing nationwide. A variety of blood tests, X-rays, CT scans, MRI scans, ultrasounds, and lung function tests may also be done to monitor the condition. TREATMENT  Sickle  cell anemia may be treated with:  Medicines. You may be given pain medicines, antibiotic medicines (to treat and prevent infections) or medicines to increase the production of certain types of hemoglobin.  Fluids.  Oxygen.  Blood transfusions. HOME CARE INSTRUCTIONS   Drink enough fluid to keep your urine clear or pale yellow. Increase your fluid intake in hot weather and during exercise.  Do not smoke. Smoking lowers oxygen levels in the blood.   Only take over-the-counter or prescription medicines for pain, fever, or discomfort as directed by your health care provider.  Take antibiotics as directed by your health care provider. Make sure you finish them it even if you start to feel better.   Take supplements as directed by your health care provider.   Consider wearing a medical alert bracelet. This tells anyone caring for you in an emergency of your condition.   When traveling, keep your medical information, health care provider's names, and the medicines you take with you at all times.   If you develop a fever, do not take medicines to reduce the fever right away. This could cover up a problem that is developing. Notify your health care provider.  Keep all follow-up appointments with your health care provider. Sickle cell anemia requires regular medical care. SEEK MEDICAL CARE IF: You have a fever. SEEK IMMEDIATE MEDICAL CARE IF:   You feel dizzy or faint.   You have new abdominal pain, especially on the left side near the stomach area.   You develop a persistent, often uncomfortable and painful penile erection (priapism). If this is not treated immediately it   will lead to impotence.   You have numbness your arms or legs or you have a hard time moving them.   You have a hard time with speech.   You have a fever or persistent symptoms for more than 2-3 days.   You have a fever and your symptoms suddenly get worse.   You have signs or symptoms of infection.  These include:   Chills.   Abnormal tiredness (lethargy).   Irritability.   Poor eating.   Vomiting.   You develop pain that is not helped with medicine.   You develop shortness of breath.  You have pain in your chest.   You are coughing up pus-like or bloody sputum.   You develop a stiff neck.  Your feet or hands swell or have pain.  Your abdomen appears bloated.  You develop joint pain. MAKE SURE YOU:  Understand these instructions.   This information is not intended to replace advice given to you by your health care provider. Make sure you discuss any questions you have with your health care provider.   Document Released: 05/06/2005 Document Revised: 02/16/2014 Document Reviewed: 09/07/2012 Elsevier Interactive Patient Education 2016 Elsevier Inc.  

## 2015-04-21 NOTE — ED Notes (Signed)
PT info me pain in chest
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
## 2015-04-23 ENCOUNTER — Observation Stay (HOSPITAL_COMMUNITY)
Admission: EM | Admit: 2015-04-23 | Discharge: 2015-04-24 | Discharge status: Against Medical Advice | Payer: Medicaid Other | Attending: Internal Medicine | Admitting: Internal Medicine

## 2015-04-23 ENCOUNTER — Encounter (HOSPITAL_COMMUNITY): Payer: Self-pay

## 2015-04-23 DIAGNOSIS — Z79891 Long term (current) use of opiate analgesic: Secondary | ICD-10-CM | POA: Insufficient documentation

## 2015-04-23 DIAGNOSIS — D509 Iron deficiency anemia, unspecified: Secondary | ICD-10-CM | POA: Insufficient documentation

## 2015-04-23 DIAGNOSIS — Z9114 Patient's other noncompliance with medication regimen: Secondary | ICD-10-CM | POA: Diagnosis not present

## 2015-04-23 DIAGNOSIS — D72829 Elevated white blood cell count, unspecified: Secondary | ICD-10-CM | POA: Diagnosis not present

## 2015-04-23 DIAGNOSIS — R9431 Abnormal electrocardiogram [ECG] [EKG]: Secondary | ICD-10-CM | POA: Insufficient documentation

## 2015-04-23 DIAGNOSIS — M549 Dorsalgia, unspecified: Secondary | ICD-10-CM | POA: Diagnosis present

## 2015-04-23 DIAGNOSIS — F17210 Nicotine dependence, cigarettes, uncomplicated: Secondary | ICD-10-CM | POA: Insufficient documentation

## 2015-04-23 DIAGNOSIS — D649 Anemia, unspecified: Secondary | ICD-10-CM | POA: Diagnosis present

## 2015-04-23 DIAGNOSIS — Z79899 Other long term (current) drug therapy: Secondary | ICD-10-CM | POA: Insufficient documentation

## 2015-04-23 DIAGNOSIS — D5700 Hb-SS disease with crisis, unspecified: Secondary | ICD-10-CM | POA: Diagnosis not present

## 2015-04-23 DIAGNOSIS — L309 Dermatitis, unspecified: Secondary | ICD-10-CM | POA: Insufficient documentation

## 2015-04-23 DIAGNOSIS — I4581 Long QT syndrome: Secondary | ICD-10-CM | POA: Diagnosis not present

## 2015-04-23 LAB — COMPREHENSIVE METABOLIC PANEL
ALBUMIN: 4.3 g/dL (ref 3.5–5.0)
ALT: 11 U/L — AB (ref 17–63)
AST: 25 U/L (ref 15–41)
Alkaline Phosphatase: 82 U/L (ref 38–126)
Anion gap: 10 (ref 5–15)
BILIRUBIN TOTAL: 4.4 mg/dL — AB (ref 0.3–1.2)
BUN: 6 mg/dL (ref 6–20)
CHLORIDE: 111 mmol/L (ref 101–111)
CO2: 22 mmol/L (ref 22–32)
CREATININE: 0.43 mg/dL — AB (ref 0.61–1.24)
Calcium: 9.3 mg/dL (ref 8.9–10.3)
GFR calc Af Amer: >60 mL/min (ref >60)
GLUCOSE: 88 mg/dL (ref 65–99)
POTASSIUM: 3.8 mmol/L (ref 3.5–5.1)
Sodium: 143 mmol/L (ref 135–145)
Total Protein: 8 g/dL (ref 6.5–8.1)

## 2015-04-23 LAB — CBC WITH DIFFERENTIAL/PLATELET
Basophils Absolute: 0.1 10*3/uL (ref 0.0–0.1)
Basophils Relative: 1 %
EOS ABS: 0.3 10*3/uL (ref 0.0–0.7)
EOS PCT: 2 %
HCT: 21.8 % — ABNORMAL LOW (ref 39.0–52.0)
Hemoglobin: 7.7 g/dL — ABNORMAL LOW (ref 13.0–17.0)
LYMPHS ABS: 4.5 10*3/uL — AB (ref 0.7–4.0)
LYMPHS PCT: 24 %
MCH: 31.4 pg (ref 26.0–34.0)
MCHC: 35.3 g/dL (ref 30.0–36.0)
MCV: 89 fL (ref 78.0–100.0)
MONO ABS: 1.6 10*3/uL — AB (ref 0.1–1.0)
MONOS PCT: 8 %
Neutro Abs: 12.4 10*3/uL — ABNORMAL HIGH (ref 1.7–7.7)
Neutrophils Relative %: 65 %
PLATELETS: 289 10*3/uL (ref 150–400)
RBC: 2.45 MIL/uL — AB (ref 4.22–5.81)
RDW: 20.5 % — AB (ref 11.5–15.5)
WBC: 18.8 10*3/uL — AB (ref 4.0–10.5)

## 2015-04-23 LAB — RETICULOCYTES
RBC.: 2.46 MIL/uL — ABNORMAL LOW (ref 4.22–5.81)
RETIC CT PCT: 18 % — AB (ref 0.4–3.1)
Retic Count, Absolute: 442.8 10*3/uL — ABNORMAL HIGH (ref 19.0–186.0)

## 2015-04-23 MED ORDER — HYDROMORPHONE HCL 2 MG/ML IJ SOLN
3.0000 mg | INTRAMUSCULAR | Status: AC | PRN
  Administered 2015-04-23 (×3): 3 mg via INTRAVENOUS
  Filled 2015-04-23 (×3): qty 2

## 2015-04-23 MED ORDER — DEXTROSE-NACL 5-0.45 % IV SOLN
INTRAVENOUS | Status: DC
  Administered 2015-04-23: 21:00:00 via INTRAVENOUS

## 2015-04-23 MED ORDER — PROMETHAZINE HCL 25 MG/ML IJ SOLN
25.0000 mg | Freq: Once | INTRAMUSCULAR | Status: AC
  Administered 2015-04-23: 25 mg via INTRAVENOUS
  Filled 2015-04-23: qty 1

## 2015-04-23 MED ORDER — DIPHENHYDRAMINE HCL 50 MG/ML IJ SOLN
25.0000 mg | Freq: Once | INTRAMUSCULAR | Status: AC
  Administered 2015-04-23: 25 mg via INTRAVENOUS
  Filled 2015-04-23: qty 1

## 2015-04-23 MED ORDER — DIPHENHYDRAMINE HCL 25 MG PO CAPS
25.0000 mg | ORAL_CAPSULE | Freq: Once | ORAL | Status: AC
  Administered 2015-04-23: 25 mg via ORAL
  Filled 2015-04-23: qty 1

## 2015-04-23 NOTE — ED Notes (Signed)
MD at bedside. 

## 2015-04-23 NOTE — ED Notes (Signed)
PA at bedside.

## 2015-04-23 NOTE — ED Notes (Addendum)
Patient c/o sickle cell pain x 3 days that includes the entire back and bilateral lower extremities. Patient denies SOB or chest pain. Patient also c/o nausea that began prior to coming to the ED.

## 2015-04-23 NOTE — ED Notes (Signed)
I ATTEMPTED TO COLLECT LABS AND WAS UNSUCCESSFUL. 
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
## 2015-04-24 ENCOUNTER — Other Ambulatory Visit: Payer: Self-pay

## 2015-04-24 ENCOUNTER — Other Ambulatory Visit: Payer: Self-pay | Admitting: Internal Medicine

## 2015-04-24 ENCOUNTER — Encounter (HOSPITAL_COMMUNITY): Payer: Self-pay | Admitting: Family Medicine

## 2015-04-24 DIAGNOSIS — L309 Dermatitis, unspecified: Secondary | ICD-10-CM | POA: Diagnosis not present

## 2015-04-24 DIAGNOSIS — R9431 Abnormal electrocardiogram [ECG] [EKG]: Secondary | ICD-10-CM | POA: Diagnosis not present

## 2015-04-24 DIAGNOSIS — D509 Iron deficiency anemia, unspecified: Secondary | ICD-10-CM | POA: Diagnosis not present

## 2015-04-24 DIAGNOSIS — D72829 Elevated white blood cell count, unspecified: Secondary | ICD-10-CM

## 2015-04-24 DIAGNOSIS — D638 Anemia in other chronic diseases classified elsewhere: Secondary | ICD-10-CM

## 2015-04-24 DIAGNOSIS — Z720 Tobacco use: Secondary | ICD-10-CM

## 2015-04-24 DIAGNOSIS — D5700 Hb-SS disease with crisis, unspecified: Secondary | ICD-10-CM

## 2015-04-24 LAB — LACTATE DEHYDROGENASE: LDH: 243 U/L — ABNORMAL HIGH (ref 98–192)

## 2015-04-24 LAB — MAGNESIUM: Magnesium: 1.9 mg/dL (ref 1.7–2.4)

## 2015-04-24 MED ORDER — KETOROLAC TROMETHAMINE 30 MG/ML IJ SOLN
30.0000 mg | Freq: Four times a day (QID) | INTRAMUSCULAR | Status: DC
  Administered 2015-04-24 (×3): 30 mg via INTRAVENOUS
  Filled 2015-04-24 (×3): qty 1

## 2015-04-24 MED ORDER — NICOTINE 14 MG/24HR TD PT24: 14.0000 mg | MEDICATED_PATCH | Freq: Every day | TRANSDERMAL | Status: DC

## 2015-04-24 MED ORDER — DIPHENHYDRAMINE HCL 50 MG/ML IJ SOLN
25.0000 mg | INTRAMUSCULAR | Status: DC | PRN
  Administered 2015-04-24: 25 mg via INTRAVENOUS
  Filled 2015-04-24 (×3): qty 0.5

## 2015-04-24 MED ORDER — OXYCODONE HCL 15 MG PO TABS: 15.0000 mg | ORAL_TABLET | ORAL | Status: DC | PRN

## 2015-04-24 MED ORDER — HYDROMORPHONE 1 MG/ML IV SOLN
INTRAVENOUS | Status: DC
  Filled 2015-04-24: qty 25

## 2015-04-24 MED ORDER — DEXTROSE-NACL 5-0.45 % IV SOLN
INTRAVENOUS | Status: DC
  Administered 2015-04-24: 02:00:00 via INTRAVENOUS

## 2015-04-24 MED ORDER — HYDROCODONE-ACETAMINOPHEN 5-325 MG PO TABS: 2.0000 | ORAL_TABLET | ORAL | Status: DC | PRN

## 2015-04-24 MED ORDER — SENNOSIDES-DOCUSATE SODIUM 8.6-50 MG PO TABS
1.0000 | ORAL_TABLET | Freq: Two times a day (BID) | ORAL | Status: DC
  Administered 2015-04-24 (×2): 1 via ORAL
  Filled 2015-04-24 (×2): qty 1

## 2015-04-24 MED ORDER — ACETAMINOPHEN 650 MG RE SUPP: 650.0000 mg | RECTAL | Status: DC | PRN

## 2015-04-24 MED ORDER — NALOXONE HCL 0.4 MG/ML IJ SOLN: 0.4000 mg | INTRAMUSCULAR | Status: DC | PRN

## 2015-04-24 MED ORDER — ONDANSETRON HCL 4 MG/2ML IJ SOLN: 4.0000 mg | Freq: Four times a day (QID) | INTRAMUSCULAR | Status: DC | PRN

## 2015-04-24 MED ORDER — ENOXAPARIN SODIUM 40 MG/0.4ML ~~LOC~~ SOLN
40.0000 mg | SUBCUTANEOUS | Status: DC
  Filled 2015-04-24: qty 0.4

## 2015-04-24 MED ORDER — ONDANSETRON HCL 4 MG PO TABS: 4.0000 mg | ORAL_TABLET | ORAL | Status: DC | PRN

## 2015-04-24 MED ORDER — MORPHINE SULFATE ER 30 MG PO TBCR: 30.0000 mg | EXTENDED_RELEASE_TABLET | Freq: Two times a day (BID) | ORAL | Status: DC

## 2015-04-24 MED ORDER — HYDROXYUREA 500 MG PO CAPS
1000.0000 mg | ORAL_CAPSULE | Freq: Every day | ORAL | Status: DC
  Administered 2015-04-24: 1000 mg via ORAL
  Filled 2015-04-24: qty 2

## 2015-04-24 MED ORDER — POLYETHYLENE GLYCOL 3350 17 G PO PACK: 17.0000 g | PACK | Freq: Every day | ORAL | Status: DC | PRN

## 2015-04-24 MED ORDER — HYDROMORPHONE HCL 2 MG/ML IJ SOLN
3.0000 mg | INTRAMUSCULAR | Status: DC | PRN
  Administered 2015-04-24 (×4): 3 mg via INTRAVENOUS
  Filled 2015-04-24 (×4): qty 2

## 2015-04-24 MED ORDER — HYDROCORTISONE 1 % EX CREA
TOPICAL_CREAM | Freq: Two times a day (BID) | CUTANEOUS | Status: DC
  Filled 2015-04-24: qty 28

## 2015-04-24 MED ORDER — ALPRAZOLAM 0.25 MG PO TABS: 0.2500 mg | ORAL_TABLET | ORAL | Status: DC | PRN

## 2015-04-24 MED ORDER — ONDANSETRON HCL 4 MG/2ML IJ SOLN
4.0000 mg | Freq: Once | INTRAMUSCULAR | Status: AC
  Administered 2015-04-24: 4 mg via INTRAVENOUS
  Filled 2015-04-24: qty 2

## 2015-04-24 MED ORDER — KETOROLAC TROMETHAMINE 30 MG/ML IJ SOLN
30.0000 mg | Freq: Once | INTRAMUSCULAR | Status: AC
  Administered 2015-04-24: 30 mg via INTRAVENOUS
  Filled 2015-04-24: qty 1

## 2015-04-24 MED ORDER — ONDANSETRON HCL 4 MG/2ML IJ SOLN: 4.0000 mg | INTRAMUSCULAR | Status: DC | PRN

## 2015-04-24 MED ORDER — HYDROMORPHONE HCL 2 MG/ML IJ SOLN
2.0000 mg | INTRAMUSCULAR | Status: DC | PRN
  Administered 2015-04-24: 2 mg via INTRAVENOUS
  Filled 2015-04-24: qty 1

## 2015-04-24 MED ORDER — SODIUM CHLORIDE 0.9% FLUSH: 9.0000 mL | INTRAVENOUS | Status: DC | PRN

## 2015-04-24 MED ORDER — ACETAMINOPHEN 325 MG PO TABS: 650.0000 mg | ORAL_TABLET | ORAL | Status: DC | PRN

## 2015-04-24 MED ORDER — FOLIC ACID 1 MG PO TABS
1.0000 mg | ORAL_TABLET | Freq: Every day | ORAL | Status: DC
  Administered 2015-04-24: 1 mg via ORAL
  Filled 2015-04-24: qty 1

## 2015-04-24 MED ORDER — DIPHENHYDRAMINE HCL 25 MG PO CAPS
25.0000 mg | ORAL_CAPSULE | ORAL | Status: DC | PRN
  Filled 2015-04-24: qty 2

## 2015-04-24 NOTE — Progress Notes (Signed)
SICKLE CELL SERVICE PROGRESS NOTE  Matthew Shannon ZOX:[id number] DOB: 1990-10-06 DOA: 04/23/2015 PCP: Jeanann Lewandowsky, MD  Assessment/Plan: Principal Problem:   Sickle cell anemia with pain (HCC) Active Problems:   Sickle cell pain crisis (HCC)   Anemia   Sickle cell crisis (HCC)   Leukocytosis   Microcytic anemia  1. Hb SS with crisis: Will start on PCA with intermittent clinician assisted doses on a PRN basis. Continue Toradol and IVF.  2. Leukocytosis: Pt has significant leukocytosis without any evidence of infection. This is likely related to crisis. However will check urine to ensure no UTI component. 3. Anemia of chronic disease: Hb currently at baseline. Pt is prescribed Hydrea, however hsi MCV does not reflect compliance.  4. Prolonged QTc: Admityting Physician notes reflect prolonged QTc. However EKG from yesterday not available for my review and EKG from 04/21/2015 does not show prolonged QTc. Will recheck EKG. 5. Eczema: Will prescribe topical Hydrocortisone 6. Chronic pain: Based on out patient records, he is supposed to be on MS Contin 30 mg and Oxycodone 15 mg. He reports that he currently has no medication at home and could not fill the prescription for MS Contin as it was unsigned and he had no transportation to get back to clinic during clinic hours to get a signature on the presctiprion 7. Medication non-compliance: Pt has issues of non-compliance with medications and attributes it to his social circumstances that prevent him from keeping appointments. I also wonder if there is a cognitive component that contributes to his non-compliance.Will ask CM to speak with patient ot see if there is any assistance that we can provide to improve adherence.    Code Status: Full Code Family Communication: N/A Disposition Plan: Not yet ready for discharge  Jeane Cashatt A.  Pager [phone]. If 7PM-7AM, please contact night-coverage.  04/24/2015, 10:37 AM   Interim History: Pt  reports that he had no pain medications at home as he doubled up on his pain medications when he was in pain. He was prescribed MS Contin but he reports that is was unsigned by the proscriber (verified with clinic nurse-Laura as correct) thus he was unable to obtain MS Contin (review of [facility] shows no MS  Contin filled since 12/2014). Currently his pain is 7/10 and  localized to legs, back and arms. He is on bolus doses of Dilaudid and pain relief is lasting only for about 20 minutes then starts to escalate. He reports that by the time he gets his next dose pain is back to 8-9/10. Pt states that he will probably have to leave AMA as he has to get home to take care of his kids.   Consultants:  None  Procedures:  None  Antibiotics:  None   Objective: Filed Vitals:   04/23/15 2330 04/24/15 0100 04/24/15 0152 04/24/15 0418  BP: 109/64 107/63 102/61 97/52  Pulse: 77 72 64 63  Temp:   97.8 F (36.6 C) 98.1 F (36.7 C)  TempSrc:   Oral Oral  Resp: Height:    (1.778 m)   Weight:   124 lb 11.2 oz (56.564 kg)   SpO2: 95% 94% 97% 98%   Weight change:   Intake/Output Summary (Last 24 hours) at 04/24/15 1037 Last data filed at 04/24/15 0600  Gross per 24 hour  Intake 1258.33 ml  Output      0 ml  Net 1258.33 ml    General: Alert, awake, oriented x3, in mod distress secondary to  pain.Marland Kitchen  HEENT: [place]/AT PEERL, EOMI, mild icterus Neck: Trachea midline,  no masses, no thyromegal,y no JVD, no carotid bruit OROPHARYNX:  Moist, No exudate/ erythema/lesions.  Heart: Regular rate and rhythm, without murmurs, rubs, gallops, PMI non-displaced, no heaves or thrills on palpation.  Lungs: Clear to auscultation, no wheezing or rhonchi noted. No increased vocal fremitus resonant to percussion  Abdomen: Soft, nontender, nondistended, positive bowel sounds, no masses no hepatosplenomegaly noted..  Neuro: No focal neurological deficits noted cranial nerves II through XII grossly  intact. Strength at functional baseline in bilateral upper and lower extremities. Musculoskeletal: No warmth swelling or erythema around joints, no spinal tenderness noted. Psychiatric: Patient alert and oriented x3, good insight and cognition, good recent to remote recall. Skin: Pt has excematous changes with excoriation present on the extensor surface of trunk.   Data Reviewed: Basic Metabolic Panel:  Recent Labs Lab 04/19/15 0406 04/20/15 2010 04/21/15 1851 04/23/15 2024 04/24/15 0217  NA 137 140 143 143  --   K 3.5 3.8 4.4 3.8  --   CL 105 105 108 111  --   CO2 --   GLUCOSE 77 88 84 88  --   BUN --   CREATININE 0.51* 0.59* 0.57* 0.43*  --   CALCIUM 9.1 9.4 9.5 9.3  --   MG  --   --   --   --  1.9   Liver Function Tests:  Recent Labs Lab 04/19/15 0406 04/20/15 2010 04/21/15 1851 04/23/15 2024  AST ALT 11* 13* 13* 11*  ALKPHOS 96 87 98 82  BILITOT 3.2* 4.0* 4.6* 4.4*  PROT 8.0 8.1 8.3* 8.0  ALBUMIN 4.3 4.6 4.6 4.3   No results for input(s): LIPASE, AMYLASE in the last 168 hours. No results for input(s): AMMONIA in the last 168 hours. CBC:  Recent Labs Lab 04/19/15 0406 04/20/15 2010 04/21/15 1851 04/23/15 2024  WBC 16.1* 19.5* 18.1* 18.8*  NEUTROABS 9.3* 11.3*  --  12.4*  HGB 7.7* 7.8* 8.2* 7.7*  HCT 21.5* 21.7* 22.9* 21.8*  MCV 88.1 87.9 87.1 89.0  PLT 321 319 330 289   Cardiac Enzymes: No results for input(s): CKTOTAL, CKMB, CKMBINDEX, TROPONINI in the last 168 hours. BNP (last 3 results) No results for input(s): BNP in the last 8760 hours.  ProBNP (last 3 results) No results for input(s): PROBNP in the last 8760 hours.  CBG: No results for input(s): GLUCAP in the last 168 hours.  No results found for this or any previous visit (from the past 240 hour(s)).   Studies: Dg Chest 2 View  04/20/2015  CLINICAL DATA:  Sickle cell crisis with chest pain EXAM: CHEST  2 VIEW COMPARISON:  04/19/2015 FINDINGS: The  lungs are clear wiithout focal pneumonia, edema, pneumothorax or pleural effusion. Interstitial markings are diffusely coarsened with chronic features. The cardiopericardial silhouette is within normal limits for size. Convex rightward thoracic scoliosis is stable. IMPRESSION: Chronic interstitial changes without acute cardiopulmonary findings. Electronically Signed   By: Kennith Center M.D.   On: 04/20/2015 20:48   Dg Chest 2 View  04/14/2015  CLINICAL DATA:  Initial evaluation for acute right-sided chest pain for 4 days. EXAM: CHEST  2 VIEW COMPARISON:  Prior study from 04/12/2015. FINDINGS: Cardiac and mediastinal silhouettes are stable in size and contour, and remain within normal limits. Lungs are normally inflated. Mild diffuse coarsening of the interstitial markings similar to prior. No consolidative  airspace disease. No pulmonary edema or pleural effusion. Linear opacity at the right costophrenic angle favored to reflect atelectasis/ scarring. No pneumothorax. No acute osseous abnormality. Changes related to sickle cell disease present within the vertebral bodies. IMPRESSION: 1. No active cardiopulmonary disease. 2. Mild diffuse chronic coarsening of the interstitial markings, similar to prior. 3. Minimal linear opacity at the right costophrenic angle, favored to reflect atelectasis/scarring, also similar. Electronically Signed   By: Rise MuBenjamin  McClintock M.D.   On: 04/14/2015 04:25   Dg Chest 2 View  04/12/2015  CLINICAL DATA:  Sickle-cell patent.  Smoker. EXAM: CHEST  2 VIEW COMPARISON:  04/06/2015 FINDINGS: Midline trachea. Normal heart size and mediastinal contours. No pleural effusion or pneumothorax. Lower lobe predominant interstitial thickening is chronic and nonspecific. No lobar consolidation. No congestive failure. IMPRESSION: No acute cardiopulmonary disease. Electronically Signed   By: Jeronimo GreavesKyle  Talbot M.D.   On: 04/12/2015 20:00   Dg Chest 2 View  04/06/2015  CLINICAL DATA:  Upper and lower  back pain. Smoker. Shortness of breath. Sickle cell patient. EXAM: CHEST  2 VIEW COMPARISON:  03/26/2015 FINDINGS: Mildly increased markings throughout the lungs. No confluent airspace opacities or effusions. Heart is borderline in size. No acute bony abnormality. Rightward scoliosis in the mid thoracic spine. IMPRESSION: Mild increased markings throughout the lungs.  No acute findings. Electronically Signed   By: Charlett NoseKevin  Dover M.D.   On: 04/06/2015 17:14   Dg Chest 2 View  03/26/2015  CLINICAL DATA:  25 year old male with left-sided chest pain and shortness of breath EXAM: CHEST  2 VIEW COMPARISON:  Prior chest x-ray 02/27/2015 FINDINGS: The lungs are clear and negative for focal airspace consolidation, pulmonary edema or suspicious pulmonary nodule. Mild chronic bronchitic changes. No pleural effusion or pneumothorax. Cardiac and mediastinal contours are within normal limits. No acute fracture or lytic or blastic osseous lesions. The visualized upper abdominal bowel gas pattern is unremarkable. Dextro convex scoliosis of the lower thoracic spine centered at T9. Surgical clips in the right upper quadrant suggest prior cholecystectomy. IMPRESSION: Stable chest x-ray without evidence of active cardiopulmonary process. Electronically Signed   By: Malachy MoanHeath  McCullough M.D.   On: 03/26/2015 16:42   Dg Chest Port 1 View  04/19/2015  CLINICAL DATA:  Chest pain for 3 days. History of sickle cell. Smoker. EXAM: PORTABLE CHEST 1 VIEW COMPARISON:  04/14/2015 FINDINGS: Heart size and pulmonary vascularity are normal. Patchy interstitial changes in the lungs similar to previous studies. No focal consolidation or airspace disease. No blunting of costophrenic angles. No pneumothorax. Thoracolumbar scoliosis convex towards the right. IMPRESSION: No active disease. Electronically Signed   By: Burman NievesWilliam  Stevens M.D.   On: 04/19/2015 02:49    Scheduled Meds: . enoxaparin (LOVENOX) injection  40 mg Subcutaneous Q24H  . folic acid   1 mg Oral Daily  . HYDROmorphone   Intravenous 6 times per day  . hydroxyurea  1,000 mg Oral Daily  . ketorolac  30 mg Intravenous 4 times per day  . senna-docusate  1 tablet Oral BID   Continuous Infusions: . dextrose 5 % and 0.45% NaCl 100 mL/hr at 04/24/15 0219    Time spent 30 minutes

## 2015-04-24 NOTE — Telephone Encounter (Signed)
Medications refilled by Dr. Hyman HopesJegede. Thanks!

## 2015-04-24 NOTE — Progress Notes (Signed)
Pt signed AMA paper, was advised of risks and is leaving against the advice of the attending physician and hospital staff.  Lenox PondsJerry L Ciaran Begay, RN

## 2015-04-24 NOTE — ED Notes (Signed)
PA at bedside.

## 2015-04-24 NOTE — Telephone Encounter (Signed)
Refill for nicotine patches given to patient. Thanks!
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
## 2015-04-27 ENCOUNTER — Emergency Department (HOSPITAL_COMMUNITY)
Admission: EM | Admit: 2015-04-27 | Discharge: 2015-04-28 | Disposition: A | Discharge status: Home / Self Care | Payer: Medicaid - Out of State | Attending: Emergency Medicine | Admitting: Emergency Medicine

## 2015-04-27 ENCOUNTER — Encounter (HOSPITAL_COMMUNITY): Payer: Self-pay | Admitting: Emergency Medicine

## 2015-04-27 DIAGNOSIS — F17210 Nicotine dependence, cigarettes, uncomplicated: Secondary | ICD-10-CM | POA: Insufficient documentation

## 2015-04-27 DIAGNOSIS — Z79899 Other long term (current) drug therapy: Secondary | ICD-10-CM | POA: Diagnosis not present

## 2015-04-27 DIAGNOSIS — D5700 Hb-SS disease with crisis, unspecified: Secondary | ICD-10-CM | POA: Insufficient documentation

## 2015-04-27 DIAGNOSIS — R110 Nausea: Secondary | ICD-10-CM | POA: Diagnosis not present

## 2015-04-27 LAB — I-STAT TROPONIN, ED: TROPONIN I, POC: 0 ng/mL (ref 0.00–0.08)

## 2015-04-27 NOTE — ED Notes (Signed)
Pt states he was sitting around the house yesterday when his chest/back pain started; patient stated he took home medications without relief in symptoms.

## 2015-04-28 ENCOUNTER — Encounter (HOSPITAL_COMMUNITY): Payer: Self-pay | Admitting: *Deleted

## 2015-04-28 ENCOUNTER — Emergency Department (HOSPITAL_COMMUNITY)
Admission: EM | Admit: 2015-04-28 | Discharge: 2015-04-28 | Disposition: A | Discharge status: Home / Self Care | Payer: Medicaid - Out of State | Source: Home / Self Care | Attending: Emergency Medicine | Admitting: Emergency Medicine

## 2015-04-28 DIAGNOSIS — F17210 Nicotine dependence, cigarettes, uncomplicated: Secondary | ICD-10-CM

## 2015-04-28 DIAGNOSIS — D5700 Hb-SS disease with crisis, unspecified: Secondary | ICD-10-CM

## 2015-04-28 LAB — CBC WITH DIFFERENTIAL/PLATELET
BASOS ABS: 0 10*3/uL (ref 0.0–0.1)
Basophils Relative: 0 %
EOS ABS: 0.7 10*3/uL (ref 0.0–0.7)
Eosinophils Relative: 4 %
HCT: 22 % — ABNORMAL LOW (ref 39.0–52.0)
Hemoglobin: 7.9 g/dL — ABNORMAL LOW (ref 13.0–17.0)
LYMPHS ABS: 4.6 10*3/uL — AB (ref 0.7–4.0)
Lymphocytes Relative: 28 %
MCH: 32.8 pg (ref 26.0–34.0)
MCHC: 35.9 g/dL (ref 30.0–36.0)
MCV: 91.3 fL (ref 78.0–100.0)
MONO ABS: 1.6 10*3/uL — AB (ref 0.1–1.0)
MONOS PCT: 10 %
NEUTROS ABS: 9.5 10*3/uL — AB (ref 1.7–7.7)
Neutrophils Relative %: 58 %
PLATELETS: 281 10*3/uL (ref 150–400)
RBC: 2.41 MIL/uL — AB (ref 4.22–5.81)
RDW: 21.8 % — AB (ref 11.5–15.5)
WBC: 16.4 10*3/uL — AB (ref 4.0–10.5)

## 2015-04-28 LAB — RETICULOCYTES
RBC.: 2.41 MIL/uL — ABNORMAL LOW (ref 4.22–5.81)
RETIC CT PCT: 19.4 % — AB (ref 0.4–3.1)
Retic Count, Absolute: 467.5 10*3/uL — ABNORMAL HIGH (ref 19.0–186.0)

## 2015-04-28 LAB — COMPREHENSIVE METABOLIC PANEL
ALBUMIN: 4.3 g/dL (ref 3.5–5.0)
ALK PHOS: 87 U/L (ref 38–126)
ALT: 10 U/L — ABNORMAL LOW (ref 17–63)
AST: 20 U/L (ref 15–41)
Anion gap: 9 (ref 5–15)
BILIRUBIN TOTAL: 3.6 mg/dL — AB (ref 0.3–1.2)
BUN: 7 mg/dL (ref 6–20)
CALCIUM: 9.3 mg/dL (ref 8.9–10.3)
CO2: 26 mmol/L (ref 22–32)
Chloride: 107 mmol/L (ref 101–111)
Creatinine, Ser: 0.46 mg/dL — ABNORMAL LOW (ref 0.61–1.24)
GFR calc Af Amer: >60 mL/min (ref >60)
GLUCOSE: 98 mg/dL (ref 65–99)
POTASSIUM: 3.7 mmol/L (ref 3.5–5.1)
Sodium: 142 mmol/L (ref 135–145)
TOTAL PROTEIN: 8 g/dL (ref 6.5–8.1)

## 2015-04-28 MED ORDER — DEXTROSE-NACL 5-0.45 % IV SOLN
INTRAVENOUS | Status: DC
  Administered 2015-04-28: 01:00:00 via INTRAVENOUS

## 2015-04-28 MED ORDER — KETOROLAC TROMETHAMINE 30 MG/ML IJ SOLN
30.0000 mg | Freq: Once | INTRAMUSCULAR | Status: AC
  Administered 2015-04-28: 30 mg via INTRAVENOUS
  Filled 2015-04-28: qty 1

## 2015-04-28 MED ORDER — PROMETHAZINE HCL 25 MG/ML IJ SOLN
25.0000 mg | Freq: Once | INTRAMUSCULAR | Status: AC
  Administered 2015-04-28: 25 mg via INTRAVENOUS
  Filled 2015-04-28: qty 1

## 2015-04-28 MED ORDER — DIPHENHYDRAMINE HCL 25 MG PO CAPS
25.0000 mg | ORAL_CAPSULE | Freq: Once | ORAL | Status: AC
  Administered 2015-04-28: 25 mg via ORAL
  Filled 2015-04-28: qty 1

## 2015-04-28 MED ORDER — ONDANSETRON HCL 4 MG/2ML IJ SOLN: 4.0000 mg | Freq: Once | INTRAMUSCULAR | Status: DC

## 2015-04-28 MED ORDER — HYDROMORPHONE HCL 2 MG/ML IJ SOLN
3.0000 mg | INTRAMUSCULAR | Status: AC
  Administered 2015-04-28 (×3): 3 mg via INTRAVENOUS
  Filled 2015-04-28 (×3): qty 2

## 2015-04-28 MED ORDER — OXYCODONE-ACETAMINOPHEN 5-325 MG PO TABS
1.0000 | ORAL_TABLET | Freq: Once | ORAL | Status: AC
  Administered 2015-04-28: 1 via ORAL
  Filled 2015-04-28: qty 1

## 2015-04-28 MED ORDER — HYDROMORPHONE HCL 2 MG/ML IJ SOLN
3.0000 mg | Freq: Once | INTRAMUSCULAR | Status: AC
  Administered 2015-04-28: 3 mg via INTRAVENOUS
  Filled 2015-04-28: qty 2

## 2015-04-28 NOTE — Discharge Instructions (Signed)
Sickle Cell Anemia, Adult Sickle cell anemia is a condition where your red blood cells are shaped like sickles. Red blood cells carry oxygen through the body. Sickle-shaped red blood cells do not live as long as normal red blood cells. They also clump together and block blood from flowing through the blood vessels. These things prevent the body from getting enough oxygen. Sickle cell anemia causes organ damage and pain. It also increases the risk of infection. HOME CARE  Drink enough fluid to keep your pee (urine) clear or pale yellow. Drink more in hot weather and during exercise.  Do not smoke. Smoking lowers oxygen levels in the blood.  Only take over-the-counter or prescription medicines as told by your doctor.  Take antibiotic medicines as told by your doctor. Make sure you finish them even if you start to feel better.  Take supplements as told by your doctor.  Consider wearing a medical alert bracelet. This tells anyone caring for you in an emergency of your condition.  When traveling, keep your medical information, doctors' names, and the medicines you take with you at all times.  If you have a fever, do not take fever medicines right away. This could cover up a problem. Tell your doctor.   Keep all follow-up visits with your doctor. Sickle cell anemia requires regular medical care. GET HELP IF: You have a fever. GET HELP RIGHT AWAY IF:  You feel dizzy or faint.  You have new belly (abdominal) pain, especially on the left side near the stomach area.  You have a lasting, often uncomfortable and painful erection of the penis (priapism). If it is not treated right away, you will become unable to have sex (impotence).  You have numbness in your arms or legs or you have a hard time moving them.  You have a hard time talking.  You have a fever or lasting symptoms for more than 2-3 days.  You have a fever and your symptoms suddenly get worse.  You have signs or symptoms of  infection. These include:  Chills.  Being more tired than normal (lethargy).  Irritability.  Poor eating.  Throwing up (vomiting).  You have pain that is not helped with medicine.  You have shortness of breath.  You have pain in your chest.  You are coughing up pus-like or bloody mucus.  You have a stiff neck.  Your feet or hands swell or have pain.  Your belly looks bloated.  Your joints hurt. MAKE SURE YOU:  Understand these instructions.  Will watch your condition.  Will get help right away if you are not doing well or get worse.   This information is not intended to replace advice given to you by your health care provider. Make sure you discuss any questions you have with your health care provider.   Document Released: 11/16/2012 Document Revised: 06/12/2014 Document Reviewed: 11/16/2012 Elsevier Interactive Patient Education [employer] Call your primary care provider first thing Monday morning to get a refill of your prescriptions.

## 2015-04-28 NOTE — ED Notes (Signed)
Pt never returned to bed. Assumed pt left prior to being seen.

## 2015-04-28 NOTE — ED Provider Notes (Signed)
  Patient is a 25 year old male past medical history of sickle cell disease who presents the ED with back pain that started yesterday. Reports no relief with his home pain medications. Endorses associated nausea. Denies fever, chills, coughing, shortness of breath, vomiting, chest pain, leg swelling.  Physical Exam  BP 107/63 mmHg  Pulse 70  Temp(Src) 98.1 F (36.7 C) (Oral)  Resp 17  Ht 5\' 10"  (1.778 m)  Wt 61.236 kg  BMI 19.37 kg/m2  SpO2 94%  Physical Exam  Constitutional: He is oriented to person, place, and time. He appears well-developed and well-nourished.  HENT:  Head: Normocephalic and atraumatic.  Mouth/Throat: Oropharynx is clear and moist. No oropharyngeal exudate.  Eyes: Conjunctivae and EOM are normal. Right eye exhibits no discharge. Left eye exhibits no discharge. No scleral icterus.  Neck: Normal range of motion. Neck supple.  Cardiovascular: Normal rate, regular rhythm, normal heart sounds and intact distal pulses.   Pulmonary/Chest: Effort normal and breath sounds normal.  Abdominal: Soft. Bowel sounds are normal.  Musculoskeletal: Normal range of motion. He exhibits no edema.  Neurological: He is alert and oriented to person, place, and time.  Skin: Skin is warm and dry.  Nursing note and vitals reviewed.   ED Course  Procedures  MDM Patient with care plan in place. I reviewed care plan myself.  Patient presents to the ED with back pain associated with sickle cell pain crisis. No relief with home meds. On initial evaluation exam unremarkable. Patient given Toradol, Phenergan, Dilaudid, Benadryl and IV fluids. Patient was given a total of 23 mg Dilaudid doses. EKG and labs appear to be a patient's baseline.  6:00am- Hand-off from Earley FavorGail Schulz, NP. Plan to give patient by mouth Percocet at 7 AM and discharged home with PCP follow-up.   Initial valuation patient reports his pain has significantly improved however he still reports feeling nauseous. Patient given  second dose of Phenergan. Exam unremarkable. Discussed results and plan for discharge with patient. Advised patient to follow up with his PCP at the sickle cell clinic. He notes he hasn't appointment scheduled for 04/30/15.  Evaluation does not show pathology requring ongoing emergent intervention or admission. Pt is hemodynamically stable and mentating appropriately. Discussed findings/results and plan with patient/guardian, who agrees with plan. All questions answered. Return precautions discussed and outpatient follow up given.          Satira Sarkicole Elizabeth [place] Lake[place]au, [place] JerseyPA-C 04/28/15 0732  Geoffery Lyonsouglas Delo, MD 04/29/15 [phone]

## 2015-04-28 NOTE — ED Notes (Addendum)
Pt states he is having a lot of back pain, states he took Oxycontin at home after leaving here without relief. Texting during triage.

## 2015-04-28 NOTE — ED Notes (Signed)
Pt was not in bed when PA came to examine him.
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
## 2015-04-28 NOTE — ED Notes (Signed)
Bed: [facility] Expected date:  Expected time:  Means of arrival:  Comments: 

## 2015-04-30 ENCOUNTER — Ambulatory Visit: Payer: Medicaid - Out of State | Admitting: Internal Medicine

## 2015-05-03 NOTE — Discharge Summary (Signed)
[name] MRN: [id number] DOB/AGE: 1990/04/24 25 y.o.  Admit date: 04/23/2015 Discharge date: 05/03/2015  Primary Care Physician:  Jeanann Lewandowsky, MD   Discharge Diagnoses:   Patient Active Problem List   Diagnosis Date Noted  . Microcytic anemia   . EKG abnormalities   . Eczema   . Sickle-cell disease with pain (HCC) 04/19/2015  . Anemia of chronic disease   . Leukocytosis 01/24/2015  . Sickle cell anemia with pain (HCC) 01/09/2015  . Dental infection 11/17/2014  . Chest pain 11/17/2014  . Anemia 10/09/2014  . Sickle cell crisis (HCC) 10/09/2014  . Tachycardia 09/26/2014  . Sickle cell pain crisis (HCC) 09/24/2014  . Community acquired pneumonia 09/24/2014  . Tobacco abuse 09/24/2014  . CAP (community acquired pneumonia) 09/24/2014    DISCHARGE MEDICATION:   Medication List    ASK your doctor about these medications        folic acid 1 MG tablet  Commonly known as:  FOLVITE  Take 1 tablet (1 mg total) by mouth daily.     HYDROmorphone 4 MG tablet  Commonly known as:  DILAUDID  Take 1 tablet (4 mg total) by mouth every 4 (four) hours.     hydroxyurea 500 MG capsule  Commonly known as:  HYDREA  Take 2 capsules (1,000 mg total) by mouth daily. May take with food to minimize GI side effects.     ibuprofen 600 MG tablet  Commonly known as:  ADVIL,MOTRIN  Take 1 tablet (600 mg total) by mouth every 8 (eight) hours as needed.     ondansetron 8 MG tablet  Commonly known as:  ZOFRAN  Take 1 tablet (8 mg total) by mouth every 8 (eight) hours as needed for nausea or vomiting.          Consults:     SIGNIFICANT DIAGNOSTIC STUDIES:  Dg Chest 2 View  04/20/2015  CLINICAL DATA:  Sickle cell crisis with chest pain EXAM: CHEST  2 VIEW COMPARISON:  04/19/2015 FINDINGS: The lungs are clear wiithout focal pneumonia, edema, pneumothorax or pleural effusion. Interstitial markings are diffusely coarsened with chronic features. The cardiopericardial silhouette is within  normal limits for size. Convex rightward thoracic scoliosis is stable. IMPRESSION: Chronic interstitial changes without acute cardiopulmonary findings. Electronically Signed   By: Kennith Center M.D.   On: 04/20/2015 20:48   Dg Chest 2 View  04/14/2015  CLINICAL DATA:  Initial evaluation for acute right-sided chest pain for 4 days. EXAM: CHEST  2 VIEW COMPARISON:  Prior study from 04/12/2015. FINDINGS: Cardiac and mediastinal silhouettes are stable in size and contour, and remain within normal limits. Lungs are normally inflated. Mild diffuse coarsening of the interstitial markings similar to prior. No consolidative airspace disease. No pulmonary edema or pleural effusion. Linear opacity at the right costophrenic angle favored to reflect atelectasis/ scarring. No pneumothorax. No acute osseous abnormality. Changes related to sickle cell disease present within the vertebral bodies. IMPRESSION: 1. No active cardiopulmonary disease. 2. Mild diffuse chronic coarsening of the interstitial markings, similar to prior. 3. Minimal linear opacity at the right costophrenic angle, favored to reflect atelectasis/scarring, also similar. Electronically Signed   By: Rise Mu M.D.   On: 04/14/2015 04:25   Dg Chest 2 View  04/12/2015  CLINICAL DATA:  Sickle-cell patent.  Smoker. EXAM: CHEST  2 VIEW COMPARISON:  04/06/2015 FINDINGS: Midline trachea. Normal heart size and mediastinal contours. No pleural effusion or pneumothorax. Lower lobe predominant interstitial thickening is chronic and nonspecific. No lobar consolidation. No  congestive failure. IMPRESSION: No acute cardiopulmonary disease. Electronically Signed   By: Jeronimo Greaves M.D.   On: 04/12/2015 20:00   Dg Chest 2 View  04/06/2015  CLINICAL DATA:  Upper and lower back pain. Smoker. Shortness of breath. Sickle cell patient. EXAM: CHEST  2 VIEW COMPARISON:  03/26/2015 FINDINGS: Mildly increased markings throughout the lungs. No confluent airspace opacities or  effusions. Heart is borderline in size. No acute bony abnormality. Rightward scoliosis in the mid thoracic spine. IMPRESSION: Mild increased markings throughout the lungs.  No acute findings. Electronically Signed   By: Charlett Nose M.D.   On: 04/06/2015 17:14   Dg Chest Port 1 View  04/19/2015  CLINICAL DATA:  Chest pain for 3 days. History of sickle cell. Smoker. EXAM: PORTABLE CHEST 1 VIEW COMPARISON:  04/14/2015 FINDINGS: Heart size and pulmonary vascularity are normal. Patchy interstitial changes in the lungs similar to previous studies. No focal consolidation or airspace disease. No blunting of costophrenic angles. No pneumothorax. Thoracolumbar scoliosis convex towards the right. IMPRESSION: No active disease. Electronically Signed   By: Burman Nieves M.D.   On: 04/19/2015 02:49      No results found for this or any previous visit (from the past 240 hour(s)).  BRIEF ADMITTING H & P: Matthew Shannon is a 25 y.o. male with PMH of sickle cell disease and tobacco abuse who presents to the ED with 3 days of severe, unrelenting pain in the back and bilateral lower extremities. Patient reports that his symptoms developed insidiously approximately 3 days ago and are consistent with his typical sickle cell pain crises. There is pain throughout the back and the bilateral lower extremities that is described as "10/10," constant, achy, worse with weightbearing or movement, only minimally alleviated by oxycodone and hydromorphone at home. Patient denies any recent fever, chills, sore throat, rhinorrhea, dyspnea, or cough. He denies any chest pain, palpitations, or shortness of breath. He endorses some nausea following administration of narcotic, but denies any vomiting or diarrhea. He attempted to treat his symptoms at home with oral hydration and hydromorphone by mouth, but his attempts were unsuccessful, ultimately prompting his presentation to the ED this morning.  In ED, patient was found to be afebrile,  saturating well on room air, and with stable blood pressure and heart rate. Initial blood work is notable for a leukocytosis to 18,000 and hemoglobin of 7.7. Chemistry panel is largely unremarkable. Ketorolac and 3 doses of IV Dilaudid 3 mg were administered in the emergency department, but the patient experienced only minimal improvement in his pain, continuing to describe it as severe. He remained hemodynamically stable and will be admitted for ongoing evaluation and management of suspected sickle cell pain crisis.   Hospital Course:  Present on Admission:  . Sickle cell anemia with pain (HCC): The patient left the hospital AGAINST MEDICAL ADVICE. He was admitted on the prior to leaving AMA. He explained that he had a social situation at home with no one to take care of his 2 young sons. I saw the patient earlier in the morning and advised the patient that I will try to make arrangements for him to obtain his oral medications as he felt that if he had his oral medications he could manage his pain at home. Once the patient received word that he could pick up his medications he abruptly got up sign AMA papers and left AGAINST MEDICAL ADVICE before the physician could even get to the floor.   . EKG abnormalities: The  patient was reported on admission to have a prolonged QT interval. However repeat EKG showed that his QT interval was within normal limits.   Please refer to the note from previously that day for details of evaluation of the day.  Disposition and Follow-up: Patient left AGAINST MEDICAL ADVICE and reported to the nurse that he was headed over to his primary care physician's office to pick up his prescriptions. I have spoken with the nurse practitioner Cindie CrumblyLatonya Hollis and made her aware of this and they will make arrangements for follow-up care with the patient with the knowledge that the patient left AGAINST MEDICAL ADVICE.   DISCHARGE EXAM:  Please refer to note from the day for physical  examination.  Blood pressure 97/52, pulse 63, temperature 98.1 F (36.7 C), temperature source Oral, resp. rate 16, height 5\' 10"  (1.778 m), weight 124 lb 11.2 oz (56.564 kg), SpO2 98 %.  No results for input(s): NA, K, CL, CO2, GLUCOSE, BUN, CREATININE, CALCIUM, MG, PHOS in the last 72 hours. No results for input(s): AST, ALT, ALKPHOS, BILITOT, PROT, ALBUMIN in the last 72 hours. No results for input(s): LIPASE, AMYLASE in the last 72 hours. No results for input(s): WBC, NEUTROABS, HGB, HCT, MCV, PLT in the last 72 hours.   Total time spent including face to face and decision making was less than 30 minutes  Signed: MATTHEWS,[name] 05/03/2015, 3:25 PM

## 2015-05-06 ENCOUNTER — Emergency Department (HOSPITAL_COMMUNITY)
Admission: EM | Admit: 2015-05-06 | Discharge: 2015-05-07 | Disposition: A | Discharge status: Home / Self Care | Payer: Medicaid - Out of State | Attending: Emergency Medicine | Admitting: Emergency Medicine

## 2015-05-06 ENCOUNTER — Emergency Department (HOSPITAL_COMMUNITY): Payer: Medicaid - Out of State

## 2015-05-06 ENCOUNTER — Encounter (HOSPITAL_COMMUNITY): Payer: Self-pay | Admitting: Emergency Medicine

## 2015-05-06 DIAGNOSIS — D5700 Hb-SS disease with crisis, unspecified: Secondary | ICD-10-CM | POA: Insufficient documentation

## 2015-05-06 DIAGNOSIS — Z79899 Other long term (current) drug therapy: Secondary | ICD-10-CM | POA: Diagnosis not present

## 2015-05-06 DIAGNOSIS — F17210 Nicotine dependence, cigarettes, uncomplicated: Secondary | ICD-10-CM | POA: Insufficient documentation

## 2015-05-06 DIAGNOSIS — Z79891 Long term (current) use of opiate analgesic: Secondary | ICD-10-CM | POA: Insufficient documentation

## 2015-05-06 LAB — COMPREHENSIVE METABOLIC PANEL
ALBUMIN: 4.6 g/dL (ref 3.5–5.0)
ALK PHOS: 75 U/L (ref 38–126)
ALT: 8 U/L — ABNORMAL LOW (ref 17–63)
AST: 25 U/L (ref 15–41)
Anion gap: 9 (ref 5–15)
BILIRUBIN TOTAL: 4.6 mg/dL — AB (ref 0.3–1.2)
BUN: 10 mg/dL (ref 6–20)
CALCIUM: 9.5 mg/dL (ref 8.9–10.3)
CO2: 24 mmol/L (ref 22–32)
Chloride: 107 mmol/L (ref 101–111)
Creatinine, Ser: 0.52 mg/dL — ABNORMAL LOW (ref 0.61–1.24)
GFR calc Af Amer: >60 mL/min (ref >60)
GFR calc non Af Amer: >60 mL/min (ref >60)
GLUCOSE: 93 mg/dL (ref 65–99)
Potassium: 4.2 mmol/L (ref 3.5–5.1)
Sodium: 140 mmol/L (ref 135–145)
TOTAL PROTEIN: 8.3 g/dL — AB (ref 6.5–8.1)

## 2015-05-06 LAB — CBC WITH DIFFERENTIAL/PLATELET
BASOS ABS: 0.1 10*3/uL (ref 0.0–0.1)
BASOS PCT: 1 %
EOS ABS: 0.5 10*3/uL (ref 0.0–0.7)
EOS PCT: 3 %
HCT: 26.7 % — ABNORMAL LOW (ref 39.0–52.0)
Hemoglobin: 9.4 g/dL — ABNORMAL LOW (ref 13.0–17.0)
Lymphocytes Relative: 25 %
Lymphs Abs: 3.7 10*3/uL (ref 0.7–4.0)
MCH: 32.6 pg (ref 26.0–34.0)
MCHC: 35.2 g/dL (ref 30.0–36.0)
MCV: 92.7 fL (ref 78.0–100.0)
MONO ABS: 2.1 10*3/uL — AB (ref 0.1–1.0)
Monocytes Relative: 14 %
Neutro Abs: 8.6 10*3/uL — ABNORMAL HIGH (ref 1.7–7.7)
Neutrophils Relative %: 57 %
PLATELETS: 282 10*3/uL (ref 150–400)
RBC: 2.88 MIL/uL — ABNORMAL LOW (ref 4.22–5.81)
RDW: 18.3 % — AB (ref 11.5–15.5)
WBC: 15 10*3/uL — ABNORMAL HIGH (ref 4.0–10.5)

## 2015-05-06 LAB — RETICULOCYTES
RBC.: 2.85 MIL/uL — ABNORMAL LOW (ref 4.22–5.81)
Retic Count, Absolute: 373.4 10*3/uL — ABNORMAL HIGH (ref 19.0–186.0)
Retic Ct Pct: 13.1 % — ABNORMAL HIGH (ref 0.4–3.1)

## 2015-05-06 MED ORDER — DEXTROSE-NACL 5-0.45 % IV SOLN
INTRAVENOUS | Status: DC
  Administered 2015-05-06: via INTRAVENOUS

## 2015-05-06 MED ORDER — KETOROLAC TROMETHAMINE 15 MG/ML IJ SOLN
15.0000 mg | Freq: Once | INTRAMUSCULAR | Status: AC
  Administered 2015-05-06: 15 mg via INTRAVENOUS
  Filled 2015-05-06: qty 1

## 2015-05-06 MED ORDER — PROMETHAZINE HCL 25 MG PO TABS
12.5000 mg | ORAL_TABLET | Freq: Once | ORAL | Status: AC
  Administered 2015-05-06: 12.5 mg via ORAL
  Filled 2015-05-06: qty 1

## 2015-05-06 MED ORDER — HYDROMORPHONE HCL 2 MG/ML IJ SOLN
3.0000 mg | INTRAMUSCULAR | Status: DC | PRN
  Administered 2015-05-06 – 2015-05-07 (×3): 3 mg via INTRAVENOUS
  Filled 2015-05-06 (×3): qty 2

## 2015-05-06 MED ORDER — DIPHENHYDRAMINE HCL 25 MG PO CAPS
25.0000 mg | ORAL_CAPSULE | Freq: Once | ORAL | Status: AC
  Administered 2015-05-06: 25 mg via ORAL
  Filled 2015-05-06: qty 1

## 2015-05-06 NOTE — ED Notes (Signed)
Bed: WA09 Expected date:  Expected time:  Means of arrival:  Comments: 25 yr old, cough

## 2015-05-06 NOTE — ED Notes (Signed)
Patient c/o SSC pain in chest and entire back x2 days. Denies other c/c.
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
## 2015-05-07 ENCOUNTER — Encounter (HOSPITAL_COMMUNITY): Payer: Self-pay

## 2015-05-07 ENCOUNTER — Emergency Department (HOSPITAL_COMMUNITY)
Admission: EM | Admit: 2015-05-07 | Discharge: 2015-05-07 | Discharge status: Against Medical Advice | Payer: Medicaid - Out of State | Source: Home / Self Care | Attending: Emergency Medicine | Admitting: Emergency Medicine

## 2015-05-07 DIAGNOSIS — F17210 Nicotine dependence, cigarettes, uncomplicated: Secondary | ICD-10-CM | POA: Insufficient documentation

## 2015-05-07 DIAGNOSIS — D5700 Hb-SS disease with crisis, unspecified: Secondary | ICD-10-CM | POA: Insufficient documentation

## 2015-05-07 DIAGNOSIS — Z79899 Other long term (current) drug therapy: Secondary | ICD-10-CM | POA: Insufficient documentation

## 2015-05-07 LAB — CBC WITH DIFFERENTIAL/PLATELET
BASOS PCT: 0 %
Basophils Absolute: 0 10*3/uL (ref 0.0–0.1)
Eosinophils Absolute: 0.5 10*3/uL (ref 0.0–0.7)
Eosinophils Relative: 3 %
HEMATOCRIT: 26.1 % — AB (ref 39.0–52.0)
HEMOGLOBIN: 9.6 g/dL — AB (ref 13.0–17.0)
LYMPHS PCT: 22 %
Lymphs Abs: 3.8 10*3/uL (ref 0.7–4.0)
MCH: 34.4 pg — AB (ref 26.0–34.0)
MCHC: 36.8 g/dL — AB (ref 30.0–36.0)
MCV: 93.5 fL (ref 78.0–100.0)
MONOS PCT: 13 %
Monocytes Absolute: 2.2 10*3/uL — ABNORMAL HIGH (ref 0.1–1.0)
NEUTROS ABS: 10.7 10*3/uL — AB (ref 1.7–7.7)
NEUTROS PCT: 62 %
Platelets: 289 10*3/uL (ref 150–400)
RBC: 2.79 MIL/uL — ABNORMAL LOW (ref 4.22–5.81)
RDW: 17.7 % — ABNORMAL HIGH (ref 11.5–15.5)
WBC: 17.2 10*3/uL — ABNORMAL HIGH (ref 4.0–10.5)

## 2015-05-07 LAB — COMPREHENSIVE METABOLIC PANEL
ALBUMIN: 4.8 g/dL (ref 3.5–5.0)
ALK PHOS: 82 U/L (ref 38–126)
ALT: 9 U/L — AB (ref 17–63)
ANION GAP: 8 (ref 5–15)
AST: 20 U/L (ref 15–41)
BILIRUBIN TOTAL: 4.4 mg/dL — AB (ref 0.3–1.2)
BUN: 9 mg/dL (ref 6–20)
CHLORIDE: 107 mmol/L (ref 101–111)
CO2: 26 mmol/L (ref 22–32)
CREATININE: 0.44 mg/dL — AB (ref 0.61–1.24)
Calcium: 9.5 mg/dL (ref 8.9–10.3)
GFR calc non Af Amer: >60 mL/min (ref >60)
GLUCOSE: 79 mg/dL (ref 65–99)
Potassium: 3.5 mmol/L (ref 3.5–5.1)
Sodium: 141 mmol/L (ref 135–145)
Total Protein: 8.3 g/dL — ABNORMAL HIGH (ref 6.5–8.1)

## 2015-05-07 LAB — RETICULOCYTES
RBC.: 2.79 MIL/uL — ABNORMAL LOW (ref 4.22–5.81)
Retic Count, Absolute: 368.3 10*3/uL — ABNORMAL HIGH (ref 19.0–186.0)
Retic Ct Pct: 13.2 % — ABNORMAL HIGH (ref 0.4–3.1)

## 2015-05-07 MED ORDER — KCL IN DEXTROSE-NACL 20-5-0.45 MEQ/L-%-% IV SOLN
Freq: Once | INTRAVENOUS | Status: AC
  Administered 2015-05-07: 10:00:00 via INTRAVENOUS
  Filled 2015-05-07: qty 1000

## 2015-05-07 MED ORDER — HYDROMORPHONE HCL 1 MG/ML IJ SOLN: 1.0000 mg | Freq: Once | INTRAMUSCULAR | Status: DC

## 2015-05-07 MED ORDER — PROCHLORPERAZINE EDISYLATE 5 MG/ML IJ SOLN
10.0000 mg | Freq: Once | INTRAMUSCULAR | Status: AC
  Administered 2015-05-07: 10 mg via INTRAVENOUS
  Filled 2015-05-07: qty 2

## 2015-05-07 MED ORDER — HYDROMORPHONE HCL 2 MG/ML IJ SOLN
2.0000 mg | Freq: Once | INTRAMUSCULAR | Status: AC
  Administered 2015-05-07: 2 mg via INTRAVENOUS
  Filled 2015-05-07: qty 1

## 2015-05-07 MED ORDER — DIPHENHYDRAMINE HCL 25 MG PO CAPS
25.0000 mg | ORAL_CAPSULE | Freq: Once | ORAL | Status: AC
Start: 2015-05-07 — End: 2015-05-07
  Administered 2015-05-07: 25 mg via ORAL
  Filled 2015-05-07: qty 1

## 2015-05-07 NOTE — ED Notes (Signed)
Bed: WA16 Expected date:  Expected time:  Means of arrival:  Comments: 

## 2015-05-07 NOTE — ED Notes (Signed)
[facility]

## 2015-05-07 NOTE — ED Notes (Addendum)
PT NOT IN ROOM. [facility] viewed IV on floor. Attempts made to look for this pt however pt is not in ED. EDPA Molly MaduroRobert made aware pt is not in department.

## 2015-05-07 NOTE — ED Provider Notes (Signed)
She with sickle cell disease, well known to this emergency department, comes in with typical pain.  He was given IV fluids.  3 rounds of 3 mg Dilaudid each by mouth Benadryl just prior to discharge.  He will follow-up with the sickle cell clinic  Earley FavorGail Demontre Padin, NP 05/07/15 [phone]  Tomasita CrumbleAdeleke Oni, MD 05/07/15 1601

## 2015-05-07 NOTE — ED Notes (Signed)
Pt seen yesterday for same.  Pain in back and legs.  Sickle cell hx. No other sx

## 2015-05-07 NOTE — ED Notes (Signed)
Attempt x 2 IV not successful- [facility]
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
## 2015-05-09 ENCOUNTER — Encounter (HOSPITAL_COMMUNITY): Payer: Self-pay | Admitting: Emergency Medicine

## 2015-05-09 ENCOUNTER — Emergency Department (HOSPITAL_COMMUNITY)
Admission: EM | Admit: 2015-05-09 | Discharge: 2015-05-09 | Disposition: A | Discharge status: Home / Self Care | Payer: Medicaid - Out of State

## 2015-05-09 ENCOUNTER — Emergency Department (HOSPITAL_COMMUNITY)
Admission: EM | Admit: 2015-05-09 | Discharge: 2015-05-10 | Disposition: A | Discharge status: Home / Self Care | Payer: Medicaid - Out of State | Attending: Emergency Medicine | Admitting: Emergency Medicine

## 2015-05-09 DIAGNOSIS — Z79899 Other long term (current) drug therapy: Secondary | ICD-10-CM | POA: Diagnosis not present

## 2015-05-09 DIAGNOSIS — D571 Sickle-cell disease without crisis: Secondary | ICD-10-CM | POA: Diagnosis not present

## 2015-05-09 DIAGNOSIS — F17210 Nicotine dependence, cigarettes, uncomplicated: Secondary | ICD-10-CM | POA: Insufficient documentation

## 2015-05-09 DIAGNOSIS — D5700 Hb-SS disease with crisis, unspecified: Secondary | ICD-10-CM

## 2015-05-09 DIAGNOSIS — M549 Dorsalgia, unspecified: Secondary | ICD-10-CM | POA: Diagnosis present

## 2015-05-09 NOTE — ED Notes (Signed)
Pt states he is having pain in his back and chest  Pt states it hurts to move around  Pt states his legs hurt too

## 2015-05-09 NOTE — ED Notes (Signed)
Called patient twice and there was no answer

## 2015-05-10 ENCOUNTER — Emergency Department (HOSPITAL_COMMUNITY): Payer: Medicaid - Out of State

## 2015-05-10 LAB — RETICULOCYTES
RBC.: 2.69 MIL/uL — ABNORMAL LOW (ref 4.22–5.81)
RETIC CT PCT: 13.5 % — AB (ref 0.4–3.1)
Retic Count, Absolute: 363.2 10*3/uL — ABNORMAL HIGH (ref 19.0–186.0)

## 2015-05-10 LAB — I-STAT CHEM 8, ED
BUN: <3 mg/dL — ABNORMAL LOW (ref 6–20)
CALCIUM ION: 1.16 mmol/L (ref 1.12–1.23)
CREATININE: 0.5 mg/dL — AB (ref 0.61–1.24)
Chloride: 103 mmol/L (ref 101–111)
GLUCOSE: 94 mg/dL (ref 65–99)
HCT: 30 % — ABNORMAL LOW (ref 39.0–52.0)
HEMOGLOBIN: 10.2 g/dL — AB (ref 13.0–17.0)
Potassium: 3.5 mmol/L (ref 3.5–5.1)
Sodium: 143 mmol/L (ref 135–145)
TCO2: 26 mmol/L (ref 0–100)

## 2015-05-10 MED ORDER — HYDROMORPHONE HCL 2 MG/ML IJ SOLN
3.0000 mg | Freq: Once | INTRAMUSCULAR | Status: AC
  Administered 2015-05-10: 3 mg via INTRAVENOUS
  Filled 2015-05-10: qty 2

## 2015-05-10 MED ORDER — ONDANSETRON HCL 4 MG/2ML IJ SOLN
4.0000 mg | Freq: Once | INTRAMUSCULAR | Status: DC
  Filled 2015-05-10: qty 2

## 2015-05-10 MED ORDER — HYDROMORPHONE HCL 2 MG/ML IJ SOLN
3.0000 mg | INTRAMUSCULAR | Status: DC | PRN
  Administered 2015-05-10: 3 mg via INTRAVENOUS
  Filled 2015-05-10 (×2): qty 2

## 2015-05-10 MED ORDER — DIPHENHYDRAMINE HCL 25 MG PO CAPS
25.0000 mg | ORAL_CAPSULE | Freq: Once | ORAL | Status: AC
  Administered 2015-05-10: 25 mg via ORAL
  Filled 2015-05-10: qty 1

## 2015-05-10 MED ORDER — MORPHINE SULFATE ER 30 MG PO TBCR: 30.0000 mg | EXTENDED_RELEASE_TABLET | Freq: Two times a day (BID) | ORAL | Status: AC

## 2015-05-10 MED ORDER — OXYCODONE HCL 15 MG PO TABS: 15.0000 mg | ORAL_TABLET | ORAL | Status: AC | PRN

## 2015-05-10 MED ORDER — DEXTROSE-NACL 5-0.45 % IV SOLN
INTRAVENOUS | Status: DC
  Administered 2015-05-10: 05:00:00 via INTRAVENOUS

## 2015-05-10 MED ORDER — PROCHLORPERAZINE EDISYLATE 5 MG/ML IJ SOLN
10.0000 mg | Freq: Once | INTRAMUSCULAR | Status: AC
  Administered 2015-05-10: 10 mg via INTRAVENOUS
  Filled 2015-05-10: qty 2

## 2015-05-10 MED ORDER — OXYCODONE HCL ER 10 MG PO T12A
20.0000 mg | EXTENDED_RELEASE_TABLET | Freq: Two times a day (BID) | ORAL | Status: AC
  Administered 2015-05-10: 20 mg via ORAL
  Filled 2015-05-10: qty 2

## 2015-05-10 NOTE — ED Notes (Signed)
Patient transported to X-ray 

## 2015-05-10 NOTE — ED Notes (Signed)
Pt complain pain in back and arms x 2 days. Pain from sickle cell.  States home medications are not helping.

## 2015-05-10 NOTE — Discharge Instructions (Signed)
Chronic Pain °Chronic pain can be defined as pain that is off and on and lasts for 3-6 months or longer. Many things cause chronic pain, which can make it difficult to make a diagnosis. There are many treatment options available for chronic pain. However, finding a treatment that works well for you may require trying various approaches until the right one is found. Many people benefit from a combination of two or more types of treatment to control their pain. °SYMPTOMS  °Chronic pain can occur anywhere in the body and can range from mild to very severe. Some types of chronic pain include: °· Headache. °· Low back pain. °· Cancer pain. °· Arthritis pain. °· Neurogenic pain. This is pain resulting from damage to nerves. ° People with chronic pain may also have other symptoms such as: °· Depression. °· Anger. °· Insomnia. °· Anxiety. °DIAGNOSIS  °Your health care provider will help diagnose your condition over time. In many cases, the initial focus will be on excluding possible conditions that could be causing the pain. Depending on your symptoms, your health care provider may order tests to diagnose your condition. Some of these tests may include:  °· Blood tests.   °· CT scan.   °· MRI.   °· X-rays.   °· Ultrasounds.   °· Nerve conduction studies.   °You may need to see a specialist.  °TREATMENT  °Finding treatment that works well may take time. You may be referred to a pain specialist. He or she may prescribe medicine or therapies, such as:  °· Mindful meditation or yoga. °· Shots (injections) of numbing or pain-relieving medicines into the spine or area of pain. °· Local electrical stimulation. °· Acupuncture.   °· Massage therapy.   °· Aroma, color, light, or sound therapy.   °· Biofeedback.   °· Working with a physical therapist to keep from getting stiff.   °· Regular, gentle exercise.   °· Cognitive or behavioral therapy.   °· Group support.   °Sometimes, surgery may be recommended.  °HOME CARE INSTRUCTIONS   °· Take all medicines as directed by your health care provider.   °· Lessen stress in your life by relaxing and doing things such as listening to calming music.   °· Exercise or be active as directed by your health care provider.   °· Eat a healthy diet and include things such as vegetables, fruits, fish, and lean meats in your diet.   °· Keep all follow-up appointments with your health care provider.   °· Attend a support group with others suffering from chronic pain. °SEEK MEDICAL CARE IF:  °· Your pain gets worse.   °· You develop a new pain that was not there before.   °· You cannot tolerate medicines given to you by your health care provider.   °· You have new symptoms since your last visit with your health care provider.   °SEEK IMMEDIATE MEDICAL CARE IF:  °· You feel weak.   °· You have decreased sensation or numbness.   °· You lose control of bowel or bladder function.   °· Your pain suddenly gets much worse.   °· You develop shaking. °· You develop chills. °· You develop confusion. °· You develop chest pain. °· You develop shortness of breath.   °MAKE SURE YOU: °· Understand these instructions. °· Will watch your condition. °· Will get help right away if you are not doing well or get worse. °  °This information is not intended to replace advice given to you by your health care provider. Make sure you discuss any questions you have with your health care provider. °  °Document Released: 10/18/2001   Document Revised: 09/28/2012 Document Reviewed: 07/22/2012 °Elsevier Interactive Patient Education ©2016 [employer] °Sickle Cell Anemia, Adult °Sickle cell anemia is a condition in which red blood cells have an abnormal "sickle" shape. This abnormal shape shortens the cells' life span, which results in a lower than normal concentration of red blood cells in the blood. The sickle shape also causes the cells to clump together and block free blood flow through the blood vessels. As a result, the tissues and organs  of the body do not receive enough oxygen. Sickle cell anemia causes organ damage and pain and increases the risk of infection. °CAUSES  °Sickle cell anemia is a genetic disorder. Those who receive two copies of the gene have the condition, and those who receive one copy have the trait. °RISK FACTORS °The sickle cell gene is most common in people whose families originated in [place]. Other areas of the globe where sickle cell trait occurs include the [place], [place] and [place], the [place], and the [place].  °SIGNS AND SYMPTOMS °· Pain, especially in the extremities, back, chest, or abdomen (common). The pain may start suddenly or may develop following an illness, especially if there is dehydration. Pain can also occur due to overexertion or exposure to extreme temperature changes. °· Frequent severe bacterial infections, especially certain types of pneumonia and meningitis. °· Pain and swelling in the hands and feet. °· Decreased activity.   °· Loss of appetite.   °· Change in behavior. °· Headaches. °· Seizures. °· Shortness of breath or difficulty breathing. °· Vision changes. °· Skin ulcers. °Those with the trait may not have symptoms or they may have mild symptoms.  °DIAGNOSIS  °Sickle cell anemia is diagnosed with blood tests that demonstrate the genetic trait. It is often diagnosed during the newborn period, due to mandatory testing nationwide. A variety of blood tests, X-rays, CT scans, MRI scans, ultrasounds, and lung function tests may also be done to monitor the condition. °TREATMENT  °Sickle cell anemia may be treated with: °· Medicines. You may be given pain medicines, antibiotic medicines (to treat and prevent infections) or medicines to increase the production of certain types of hemoglobin. °· Fluids. °· Oxygen. °· Blood transfusions. °HOME CARE INSTRUCTIONS  °· Drink enough fluid to keep your urine clear or pale yellow. Increase your fluid intake in hot weather and during  exercise. °· Do not smoke. Smoking lowers oxygen levels in the blood.   °· Only take over-the-counter or prescription medicines for pain, fever, or discomfort as directed by your health care provider. °· Take antibiotics as directed by your health care provider. Make sure you finish them it even if you start to feel better.   °· Take supplements as directed by your health care provider.   °· Consider wearing a medical alert bracelet. This tells anyone caring for you in an emergency of your condition.   °· When traveling, keep your medical information, health care provider's names, and the medicines you take with you at all times.   °· If you develop a fever, do not take medicines to reduce the fever right away. This could cover up a problem that is developing. Notify your health care provider. °· Keep all follow-up appointments with your health care provider. Sickle cell anemia requires regular medical care. °SEEK MEDICAL CARE IF: ° You have a fever. °SEEK IMMEDIATE MEDICAL CARE IF:  °· You feel dizzy or faint.   °· You have new abdominal pain, especially on the left side near the stomach area.   °· You develop a persistent,   often uncomfortable and painful penile erection (priapism). If this is not treated immediately it will lead to impotence.   °· You have numbness your arms or legs or you have a hard time moving them.   °· You have a hard time with speech.   °· You have a fever or persistent symptoms for more than 2-3 days.   °· You have a fever and your symptoms suddenly get worse.   °· You have signs or symptoms of infection. These include:   °¨ Chills.   °¨ Abnormal tiredness (lethargy).   °¨ Irritability.   °¨ Poor eating.   °¨ Vomiting.   °· You develop pain that is not helped with medicine.   °· You develop shortness of breath. °· You have pain in your chest.   °· You are coughing up pus-like or bloody sputum.   °· You develop a stiff neck. °· Your feet or hands swell or have pain. °· Your abdomen appears  bloated. °· You develop joint pain. °MAKE SURE YOU: °· Understand these instructions. °  °This information is not intended to replace advice given to you by your health care provider. Make sure you discuss any questions you have with your health care provider. °  °Document Released: 05/06/2005 Document Revised: 02/16/2014 Document Reviewed: 09/07/2012 °Elsevier Interactive Patient Education ©2016 [employer] ° °
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
## 2015-05-12 ENCOUNTER — Telehealth (HOSPITAL_COMMUNITY): Payer: Self-pay

## 2015-05-12 NOTE — Telephone Encounter (Signed)
Pharmacy calling for verification of 2 Rx form 3/31 ED visit.  Verification provided.

## 2015-05-21 ENCOUNTER — Encounter: Payer: Self-pay | Admitting: Internal Medicine

## 2015-05-21 ENCOUNTER — Ambulatory Visit (INDEPENDENT_AMBULATORY_CARE_PROVIDER_SITE_OTHER): Payer: Medicaid - Out of State | Admitting: Internal Medicine

## 2015-05-21 VITALS — BP 102/53 | HR 80 | Temp 98.3°F | Resp 18 | Ht 70.0 in | Wt 126.0 lb

## 2015-05-21 DIAGNOSIS — D5700 Hb-SS disease with crisis, unspecified: Secondary | ICD-10-CM | POA: Diagnosis not present

## 2015-05-21 DIAGNOSIS — Z720 Tobacco use: Secondary | ICD-10-CM | POA: Diagnosis not present

## 2015-05-21 DIAGNOSIS — G8929 Other chronic pain: Secondary | ICD-10-CM

## 2015-05-21 MED ORDER — FOLIC ACID 1 MG PO TABS: 1.0000 mg | ORAL_TABLET | Freq: Every day | ORAL | Status: DC

## 2015-05-21 MED ORDER — IBUPROFEN 600 MG PO TABS: 600.0000 mg | ORAL_TABLET | Freq: Three times a day (TID) | ORAL | Status: DC | PRN

## 2015-05-21 MED ORDER — HYDROXYUREA 500 MG PO CAPS: 1000.0000 mg | ORAL_CAPSULE | Freq: Every day | ORAL | Status: DC

## 2015-05-21 MED ORDER — OXYCODONE HCL 20 MG PO TABS: 20.0000 mg | ORAL_TABLET | Freq: Four times a day (QID) | ORAL | Status: DC | PRN

## 2015-05-21 NOTE — Progress Notes (Signed)
Patient is here for FU SCD  Patient complains of back pain "all over" scaled currently at an 8.

## 2015-05-21 NOTE — Patient Instructions (Signed)
Smoking Cessation, Tips for Success If you are ready to quit smoking, congratulations! You have chosen to help yourself be healthier. Cigarettes bring nicotine, tar, carbon monoxide, and other irritants into your body. Your lungs, heart, and blood vessels will be able to work better without these poisons. There are many different ways to quit smoking. Nicotine gum, nicotine patches, a nicotine inhaler, or nicotine nasal spray can help with physical craving. Hypnosis, support groups, and medicines help break the habit of smoking. WHAT THINGS CAN I DO TO MAKE QUITTING EASIER?  Here are some tips to help you quit for good:  Pick a date when you will quit smoking completely. Tell all of your friends and family about your plan to quit on that date.  Do not try to slowly cut down on the number of cigarettes you are smoking. Pick a quit date and quit smoking completely starting on that day.  Throw away all cigarettes.   Clean and remove all ashtrays from your home, work, and car.  On a card, write down your reasons for quitting. Carry the card with you and read it when you get the urge to smoke.  Cleanse your body of nicotine. Drink enough water and fluids to keep your urine clear or pale yellow. Do this after quitting to flush the nicotine from your body.  Learn to predict your moods. Do not let a bad situation be your excuse to have a cigarette. Some situations in your life might tempt you into wanting a cigarette.  Never have "just one" cigarette. It leads to wanting another and another. Remind yourself of your decision to quit.  Change habits associated with smoking. If you smoked while driving or when feeling stressed, try other activities to replace smoking. Stand up when drinking your coffee. Brush your teeth after eating. Sit in a different chair when you read the paper. Avoid alcohol while trying to quit, and try to drink fewer caffeinated beverages. Alcohol and caffeine may urge you to  smoke.  Avoid foods and drinks that can trigger a desire to smoke, such as sugary or spicy foods and alcohol.  Ask people who smoke not to smoke around you.  Have something planned to do right after eating or having a cup of coffee. For example, plan to take a walk or exercise.  Try a relaxation exercise to calm you down and decrease your stress. Remember, you may be tense and nervous for the first 2 weeks after you quit, but this will pass.  Find new activities to keep your hands busy. Play with a pen, coin, or rubber band. Doodle or draw things on paper.  Brush your teeth right after eating. This will help cut down on the craving for the taste of tobacco after meals. You can also try mouthwash.   Use oral substitutes in place of cigarettes. Try using lemon drops, carrots, cinnamon sticks, or chewing gum. Keep them handy so they are available when you have the urge to smoke.  When you have the urge to smoke, try deep breathing.  Designate your home as a nonsmoking area.  If you are a heavy smoker, ask your health care provider about a prescription for nicotine chewing gum. It can ease your withdrawal from nicotine.  Reward yourself. Set aside the cigarette money you save and buy yourself something nice.  Look for support from others. Join a support group or smoking cessation program. Ask someone at home or at work to help you with your plan   to quit smoking.  Always ask yourself, "Do I need this cigarette or is this just a reflex?" Tell yourself, "Today, I choose not to smoke," or "I do not want to smoke." You are reminding yourself of your decision to quit.  Do not replace cigarette smoking with electronic cigarettes (commonly called e-cigarettes). The safety of e-cigarettes is unknown, and some may contain harmful chemicals.  If you relapse, do not give up! Plan ahead and think about what you will do the next time you get the urge to smoke. HOW WILL I FEEL WHEN I QUIT SMOKING? You  may have symptoms of withdrawal because your body is used to nicotine (the addictive substance in cigarettes). You may crave cigarettes, be irritable, feel very hungry, cough often, get headaches, or have difficulty concentrating. The withdrawal symptoms are only temporary. They are strongest when you first quit but will go away within 10-14 days. When withdrawal symptoms occur, stay in control. Think about your reasons for quitting. Remind yourself that these are signs that your body is healing and getting used to being without cigarettes. Remember that withdrawal symptoms are easier to treat than the major diseases that smoking can cause.  Even after the withdrawal is over, expect periodic urges to smoke. However, these cravings are generally short lived and will go away whether you smoke or not. Do not smoke! WHAT RESOURCES ARE AVAILABLE TO HELP ME QUIT SMOKING? Your health care provider can direct you to community resources or hospitals for support, which may include:  Group support.  Education.  Hypnosis.  Therapy.   This information is not intended to replace advice given to you by your health care provider. Make sure you discuss any questions you have with your health care provider.   Document Released: 10/25/2003 Document Revised: 02/16/2014 Document Reviewed: 07/14/2012 Elsevier Interactive Patient Education 2016 [employer] Chronic Pain Chronic pain can be defined as pain that is off and on and lasts for 3-6 months or longer. Many things cause chronic pain, which can make it difficult to make a diagnosis. There are many treatment options available for chronic pain. However, finding a treatment that works well for you may require trying various approaches until the right one is found. Many people benefit from a combination of two or more types of treatment to control their pain. SYMPTOMS  Chronic pain can occur anywhere in the body and can range from mild to very severe. Some types of  chronic pain include:  Headache.  Low back pain.  Cancer pain.  Arthritis pain.  Neurogenic pain. This is pain resulting from damage to nerves. People with chronic pain may also have other symptoms such as:  Depression.  Anger.  Insomnia.  Anxiety. DIAGNOSIS  Your health care provider will help diagnose your condition over time. In many cases, the initial focus will be on excluding possible conditions that could be causing the pain. Depending on your symptoms, your health care provider may order tests to diagnose your condition. Some of these tests may include:   Blood tests.   CT scan.   MRI.   X-rays.   Ultrasounds.   Nerve conduction studies.  You may need to see a specialist.  TREATMENT  Finding treatment that works well may take time. You may be referred to a pain specialist. He or she may prescribe medicine or therapies, such as:   Mindful meditation or yoga.  Shots (injections) of numbing or pain-relieving medicines into the spine or area of pain.  Local  electrical stimulation.  Acupuncture.   Massage therapy.   Aroma, color, light, or sound therapy.   Biofeedback.   Working with a physical therapist to keep from getting stiff.   Regular, gentle exercise.   Cognitive or behavioral therapy.   Group support.  Sometimes, surgery may be recommended.  HOME CARE INSTRUCTIONS   Take all medicines as directed by your health care provider.   Lessen stress in your life by relaxing and doing things such as listening to calming music.   Exercise or be active as directed by your health care provider.   Eat a healthy diet and include things such as vegetables, fruits, fish, and lean meats in your diet.   Keep all follow-up appointments with your health care provider.   Attend a support group with others suffering from chronic pain. SEEK MEDICAL CARE IF:   Your pain gets worse.   You develop a new pain that was not there  before.   You cannot tolerate medicines given to you by your health care provider.   You have new symptoms since your last visit with your health care provider.  SEEK IMMEDIATE MEDICAL CARE IF:   You feel weak.   You have decreased sensation or numbness.   You lose control of bowel or bladder function.   Your pain suddenly gets much worse.   You develop shaking.  You develop chills.  You develop confusion.  You develop chest pain.  You develop shortness of breath.  MAKE SURE YOU:  Understand these instructions.  Will watch your condition.  Will get help right away if you are not doing well or get worse.   This information is not intended to replace advice given to you by your health care provider. Make sure you discuss any questions you have with your health care provider.   Document Released: 10/18/2001 Document Revised: 09/28/2012 Document Reviewed: 07/22/2012 Elsevier Interactive Patient Education 2016 Else[employer] Inc. Sickle Cell Anemia, Adult Sickle cell anemia is a condition in which red blood cells have an abnormal "sickle" shape. This abnormal shape shortens the cells' life span, which results in a lower than normal concentration of red blood cells in the blood. The sickle shape also causes the cells to clump together and block free blood flow through the blood vessels. As a result, the tissues and organs of the body do not receive enough oxygen. Sickle cell anemia causes organ damage and pain and increases the risk of infection. CAUSES  Sickle cell anemia is a genetic disorder. Those who receive two copies of the gene have the condition, and those who receive one copy have the trait. RISK FACTORS The sickle cell gene is most common in people whose families originated in [place]. Other areas of the globe where sickle cell trait occurs include the [place], [place], the [place], and the [place].  SIGNS AND SYMPTOMS  Pain, especially  in the extremities, back, chest, or abdomen (common). The pain may start suddenly or may develop following an illness, especially if there is dehydration. Pain can also occur due to overexertion or exposure to extreme temperature changes.  Frequent severe bacterial infections, especially certain types of pneumonia and meningitis.  Pain and swelling in the hands and feet.  Decreased activity.   Loss of appetite.   Change in behavior.  Headaches.  Seizures.  Shortness of breath or difficulty breathing.  Vision changes.  Skin ulcers. Those with the trait may not have symptoms or they may have mild  symptoms.  DIAGNOSIS  Sickle cell anemia is diagnosed with blood tests that demonstrate the genetic trait. It is often diagnosed during the newborn period, due to mandatory testing nationwide. A variety of blood tests, X-rays, CT scans, MRI scans, ultrasounds, and lung function tests may also be done to monitor the condition. TREATMENT  Sickle cell anemia may be treated with:  Medicines. You may be given pain medicines, antibiotic medicines (to treat and prevent infections) or medicines to increase the production of certain types of hemoglobin.  Fluids.  Oxygen.  Blood transfusions. HOME CARE INSTRUCTIONS   Drink enough fluid to keep your urine clear or pale yellow. Increase your fluid intake in hot weather and during exercise.  Do not smoke. Smoking lowers oxygen levels in the blood.   Only take over-the-counter or prescription medicines for pain, fever, or discomfort as directed by your health care provider.  Take antibiotics as directed by your health care provider. Make sure you finish them it even if you start to feel better.   Take supplements as directed by your health care provider.   Consider wearing a medical alert bracelet. This tells anyone caring for you in an emergency of your condition.   When traveling, keep your medical information, health care provider's  names, and the medicines you take with you at all times.   If you develop a fever, do not take medicines to reduce the fever right away. This could cover up a problem that is developing. Notify your health care provider.  Keep all follow-up appointments with your health care provider. Sickle cell anemia requires regular medical care. SEEK MEDICAL CARE IF: You have a fever. SEEK IMMEDIATE MEDICAL CARE IF:   You feel dizzy or faint.   You have new abdominal pain, especially on the left side near the stomach area.   You develop a persistent, often uncomfortable and painful penile erection (priapism). If this is not treated immediately it will lead to impotence.   You have numbness your arms or legs or you have a hard time moving them.   You have a hard time with speech.   You have a fever or persistent symptoms for more than 2-3 days.   You have a fever and your symptoms suddenly get worse.   You have signs or symptoms of infection. These include:   Chills.   Abnormal tiredness (lethargy).   Irritability.   Poor eating.   Vomiting.   You develop pain that is not helped with medicine.   You develop shortness of breath.  You have pain in your chest.   You are coughing up pus-like or bloody sputum.   You develop a stiff neck.  Your feet or hands swell or have pain.  Your abdomen appears bloated.  You develop joint pain. MAKE SURE YOU:  Understand these instructions.   This information is not intended to replace advice given to you by your health care provider. Make sure you discuss any questions you have with your health care provider.   Document Released: 05/06/2005 Document Revised: 02/16/2014 Document Reviewed: 09/07/2012 Elsevier Interactive Patient Education [employer]

## 2015-05-21 NOTE — Progress Notes (Signed)
Patient ID: Matthew Shannon, male   DOB: 1990/12/19, 25 y.o.   MRN: [id number]   Willis Holquin, is a 25 y.o. male  [id number]  [id number]  DOB - 31-Aug-1990  Chief Complaint  Patient presents with  . Follow-up        Subjective:   Matthew Shannon is a 25 y.o. male history of sickle cell anemia and ED overutilization for pain here today for a follow up visit. We have had several conversations about his behavior, patient claims he is ready to change and be compliant with his medications instead of going to the ED frequently. He is now looking for other means of taking care of his 2 kids, one possibility is sending them to daycare, this will allow him to keep his appointments with [place] in the clinic and prevent him from running out of pain medications that usually result in ED use. Patient has no new complaints today. Although he said his current oxycodone dosage is not controlling his pain, he has been using his brother's oxycodone 20 mg which gives him sustained relief. His last ED visit was 05/09/2015. Patient is also planning to travel to [place] for a family reunion and will need at least one month supply of his medications to keep him from going to the ED in [place]. Patient continues to smoke cigarette although he is planning to quit. Patient has No headache, No chest pain, No abdominal pain - No Nausea, No new weakness tingling or numbness, No Cough - SOB.  Problem  Chronic Pain    ALLERGIES: No Known Allergies  PAST MEDICAL HISTORY: Past Medical History  Diagnosis Date  . Sickle cell anemia (HCC)     MEDICATIONS AT HOME: Prior to Admission medications   Medication Sig Start Date End Date Taking? Authorizing Provider  folic acid (FOLVITE) 1 MG tablet Take 1 tablet (1 mg total) by mouth daily. 05/21/15  Yes [name], MD  hydroxyurea (HYDREA) 500 MG capsule Take 2 capsules (1,000 mg total) by mouth daily. May take with food to minimize GI side effects. 05/21/15  Yes  [name], MD  ibuprofen (ADVIL,MOTRIN) 600 MG tablet Take 1 tablet (600 mg total) by mouth every 8 (eight) hours as needed for moderate pain. 05/21/15   [name], MD  Oxycodone HCl 20 MG TABS Take 1 tablet (20 mg total) by mouth every 6 (six) hours as needed. 05/21/15   [name], MD     Objective:   Filed Vitals:   05/21/15 1114  BP: 102/53  Pulse: 80  Temp: 98.3 F (36.8 C)  TempSrc: Oral  Resp: 18  Height:  (1.778 m)  Weight: 126 lb (57.153 kg)  SpO2: 99%    Exam General appearance : Awake, alert, not in any distress. Speech Clear. Not toxic looking HEENT: Atraumatic and Normocephalic, pupils equally reactive to light and accomodation Neck: supple, no JVD. No cervical lymphadenopathy.  Chest:Good air entry bilaterally, no added sounds  CVS: S1 S2 regular, no murmurs.  Abdomen: Bowel sounds present, Non tender and not distended with no gaurding, rigidity or rebound. Extremities: B/L Lower Ext shows no edema, both legs are warm to touch Neurology: Awake alert, and oriented X 3, CN II-XII intact, Non focal Skin:No Rash  Data Review No results found for: HGBA1C   Assessment & Plan   1. Sickle cell anemia with pain (HCC)  Restart - hydroxyurea (HYDREA) 500 MG capsule; Take 2 capsules (1,000 mg total) by mouth  daily. May take with food to minimize GI side effects.  Dispense: 180 capsule; Refill: 3 - ibuprofen (ADVIL,MOTRIN) 600 MG tablet; Take 1 tablet (600 mg total) by mouth every 8 (eight) hours as needed for moderate pain.  Dispense: 60 tablet; Refill: 3 - folic acid (FOLVITE) 1 MG tablet; Take 1 tablet (1 mg total) by mouth daily.  Dispense: 90 tablet; Refill: 11 Increase - Oxycodone HCl 20 MG TABS; Take 1 tablet (20 mg total) by mouth every 6 (six) hours as needed.  Dispense: 120 tablet; Refill: 0  We discussed the need for good hydration, monitoring of hydration status, avoidance of heat, cold, stress, and infection triggers. We  discussed the risks and benefits of Hydrea, including bone marrow suppression, the possibility of GI upset, skin ulcers, hair thinning, and teratogenicity. The patient was reminded of the need to seek medical attention of any symptoms of bleeding, anemia, or infection. Continue folic acid 1 mg daily to prevent aplastic bone marrow crises.   2. Chronic pain  - Oxycodone HCl 20 MG TABS; Take 1 tablet (20 mg total) by mouth every 6 (six) hours as needed.  Dispense: 120 tablet; Refill: 0  We agreed on Opiate dose and amount of pills  per month. We discussed that pt is to receive Schedule II prescriptions only from our clinic. Pt is also aware that the prescription history is available to us online through the [facility] CS[place]. Controlled substance agreement reviewed and signed. We reminded [name] that all patients receiving Schedule II narcotics must be seen for follow within one month of prescription being requested. We reviewed the terms of our pain agreement, including the need to keep medicines in a safe locked location away from children or pets, and the need to report excess sedation or constipation, measures to avoid constipation, and policies related to early refills and stolen prescriptions. According to the [place] [facility] program, we have reviewed details related to analgesia, adverse effects and aberrant behaviors.  3. Tobacco abuse  [name] was counseled on the dangers of tobacco use, and was advised to quit. Reviewed strategies to maximize success, including removing cigarettes and smoking materials from environment, stress management and support of family/friends.  Patient have been counseled extensively about nutrition and exercise  Return in about 2 months (around 07/21/2015) for Sickle Cell Disease/Pain.  The patient was given clear instructions to go to ER or return to medical center if symptoms don't improve, worsen or new problems develop. The patient verbalized understanding. The  patient was told to call to get lab results if they haven't heard anything in the next week.   This note has been created with Education officer, environmentalDragon speech recognition software and smart phrase technology. Any transcriptional errors are unintentional.    Jeanann LewandowskyJEGEDE, Sokhna Christoph, MD, MHA, Maxwell CaulFACP, FAAP, CPE [facility] [place], [place] [phone]   05/21/2015, 11:47 AM

## 2015-06-05 ENCOUNTER — Encounter (HOSPITAL_COMMUNITY): Payer: Self-pay

## 2015-06-05 ENCOUNTER — Emergency Department (HOSPITAL_COMMUNITY)
Admission: EM | Admit: 2015-06-05 | Discharge: 2015-06-05 | Disposition: A | Discharge status: Home / Self Care | Payer: Medicaid - Out of State | Attending: Emergency Medicine | Admitting: Emergency Medicine

## 2015-06-05 DIAGNOSIS — D5700 Hb-SS disease with crisis, unspecified: Secondary | ICD-10-CM | POA: Insufficient documentation

## 2015-06-05 DIAGNOSIS — Z79899 Other long term (current) drug therapy: Secondary | ICD-10-CM | POA: Insufficient documentation

## 2015-06-05 DIAGNOSIS — R011 Cardiac murmur, unspecified: Secondary | ICD-10-CM | POA: Diagnosis not present

## 2015-06-05 DIAGNOSIS — F17210 Nicotine dependence, cigarettes, uncomplicated: Secondary | ICD-10-CM | POA: Diagnosis not present

## 2015-06-05 LAB — CBC WITH DIFFERENTIAL/PLATELET
BASOS PCT: 1 %
Basophils Absolute: 0.1 10*3/uL (ref 0.0–0.1)
EOS ABS: 0.5 10*3/uL (ref 0.0–0.7)
EOS PCT: 3 %
HCT: 21.9 % — ABNORMAL LOW (ref 39.0–52.0)
HEMOGLOBIN: 7.9 g/dL — AB (ref 13.0–17.0)
Lymphocytes Relative: 30 %
Lymphs Abs: 5.1 10*3/uL — ABNORMAL HIGH (ref 0.7–4.0)
MCH: 32.8 pg (ref 26.0–34.0)
MCHC: 36.1 g/dL — AB (ref 30.0–36.0)
MCV: 90.9 fL (ref 78.0–100.0)
MONOS PCT: 10 %
Monocytes Absolute: 1.7 10*3/uL — ABNORMAL HIGH (ref 0.1–1.0)
NEUTROS PCT: 56 %
Neutro Abs: 9.9 10*3/uL — ABNORMAL HIGH (ref 1.7–7.7)
PLATELETS: 327 10*3/uL (ref 150–400)
RBC: 2.41 MIL/uL — ABNORMAL LOW (ref 4.22–5.81)
RDW: 19.6 % — AB (ref 11.5–15.5)
WBC: 17.3 10*3/uL — ABNORMAL HIGH (ref 4.0–10.5)

## 2015-06-05 LAB — COMPREHENSIVE METABOLIC PANEL
ALK PHOS: 94 U/L (ref 38–126)
ALT: 11 U/L — AB (ref 17–63)
AST: 28 U/L (ref 15–41)
Albumin: 4.5 g/dL (ref 3.5–5.0)
Anion gap: 9 (ref 5–15)
BUN: 8 mg/dL (ref 6–20)
CALCIUM: 9.4 mg/dL (ref 8.9–10.3)
CO2: 26 mmol/L (ref 22–32)
CREATININE: 0.55 mg/dL — AB (ref 0.61–1.24)
Chloride: 102 mmol/L (ref 101–111)
Glucose, Bld: 83 mg/dL (ref 65–99)
Potassium: 3.6 mmol/L (ref 3.5–5.1)
SODIUM: 137 mmol/L (ref 135–145)
Total Bilirubin: 5.5 mg/dL — ABNORMAL HIGH (ref 0.3–1.2)
Total Protein: 8.4 g/dL — ABNORMAL HIGH (ref 6.5–8.1)

## 2015-06-05 LAB — RETICULOCYTES
RBC.: 2.43 MIL/uL — AB (ref 4.22–5.81)
RETIC CT PCT: 18 % — AB (ref 0.4–3.1)
Retic Count, Absolute: 437.4 10*3/uL — ABNORMAL HIGH (ref 19.0–186.0)

## 2015-06-05 MED ORDER — KETOROLAC TROMETHAMINE 30 MG/ML IJ SOLN
30.0000 mg | Freq: Once | INTRAMUSCULAR | Status: AC
  Administered 2015-06-05: 30 mg via INTRAVENOUS
  Filled 2015-06-05: qty 1

## 2015-06-05 MED ORDER — DEXTROSE-NACL 5-0.45 % IV SOLN
INTRAVENOUS | Status: DC
  Administered 2015-06-05: 21:00:00 via INTRAVENOUS

## 2015-06-05 MED ORDER — ONDANSETRON HCL 4 MG/2ML IJ SOLN
4.0000 mg | Freq: Once | INTRAMUSCULAR | Status: DC
  Filled 2015-06-05: qty 2

## 2015-06-05 MED ORDER — DIPHENHYDRAMINE HCL 50 MG/ML IJ SOLN
25.0000 mg | Freq: Once | INTRAMUSCULAR | Status: AC
  Administered 2015-06-05: 25 mg via INTRAVENOUS
  Filled 2015-06-05: qty 1

## 2015-06-05 MED ORDER — OXYCODONE HCL 5 MG PO TABS
5.0000 mg | ORAL_TABLET | Freq: Once | ORAL | Status: AC
  Administered 2015-06-05: 5 mg via ORAL
  Filled 2015-06-05: qty 1

## 2015-06-05 MED ORDER — HYDROMORPHONE HCL 2 MG/ML IJ SOLN
3.0000 mg | INTRAMUSCULAR | Status: AC
  Administered 2015-06-05 (×3): 3 mg via INTRAVENOUS
  Filled 2015-06-05 (×3): qty 2

## 2015-06-05 NOTE — Discharge Instructions (Signed)
Sickle Cell Anemia, Adult Sickle cell anemia is a condition in which red blood cells have an abnormal "sickle" shape. This abnormal shape shortens the cells' life span, which results in a lower than normal concentration of red blood cells in the blood. The sickle shape also causes the cells to clump together and block free blood flow through the blood vessels. As a result, the tissues and organs of the body do not receive enough oxygen. Sickle cell anemia causes organ damage and pain and increases the risk of infection. CAUSES  Sickle cell anemia is a genetic disorder. Those who receive two copies of the gene have the condition, and those who receive one copy have the trait. RISK FACTORS The sickle cell gene is most common in people whose families originated in [place]. Other areas of the globe where sickle cell trait occurs include the Mediterranean, [place] and [place], the [place], and the [place].  SIGNS AND SYMPTOMS  Pain, especially in the extremities, back, chest, or abdomen (common). The pain may start suddenly or may develop following an illness, especially if there is dehydration. Pain can also occur due to overexertion or exposure to extreme temperature changes.  Frequent severe bacterial infections, especially certain types of pneumonia and meningitis.  Pain and swelling in the hands and feet.  Decreased activity.   Loss of appetite.   Change in behavior.  Headaches.  Seizures.  Shortness of breath or difficulty breathing.  Vision changes.  Skin ulcers. Those with the trait may not have symptoms or they may have mild symptoms.  DIAGNOSIS  Sickle cell anemia is diagnosed with blood tests that demonstrate the genetic trait. It is often diagnosed during the newborn period, due to mandatory testing nationwide. A variety of blood tests, X-rays, CT scans, MRI scans, ultrasounds, and lung function tests may also be done to monitor the condition. TREATMENT  Sickle  cell anemia may be treated with:  Medicines. You may be given pain medicines, antibiotic medicines (to treat and prevent infections) or medicines to increase the production of certain types of hemoglobin.  Fluids.  Oxygen.  Blood transfusions. HOME CARE INSTRUCTIONS   Drink enough fluid to keep your urine clear or pale yellow. Increase your fluid intake in hot weather and during exercise.  Do not smoke. Smoking lowers oxygen levels in the blood.   Only take over-the-counter or prescription medicines for pain, fever, or discomfort as directed by your health care provider.  Take antibiotics as directed by your health care provider. Make sure you finish them it even if you start to feel better.   Take supplements as directed by your health care provider.   Consider wearing a medical alert bracelet. This tells anyone caring for you in an emergency of your condition.   When traveling, keep your medical information, health care provider's names, and the medicines you take with you at all times.   If you develop a fever, do not take medicines to reduce the fever right away. This could cover up a problem that is developing. Notify your health care provider.  Keep all follow-up appointments with your health care provider. Sickle cell anemia requires regular medical care. SEEK MEDICAL CARE IF: You have a fever. SEEK IMMEDIATE MEDICAL CARE IF:   You feel dizzy or faint.   You have new abdominal pain, especially on the left side near the stomach area.   You develop a persistent, often uncomfortable and painful penile erection (priapism). If this is not treated immediately it   will lead to impotence.   You have numbness your arms or legs or you have a hard time moving them.   You have a hard time with speech.   You have a fever or persistent symptoms for more than 2-3 days.   You have a fever and your symptoms suddenly get worse.   You have signs or symptoms of infection.  These include:   Chills.   Abnormal tiredness (lethargy).   Irritability.   Poor eating.   Vomiting.   You develop pain that is not helped with medicine.   You develop shortness of breath.  You have pain in your chest.   You are coughing up pus-like or bloody sputum.   You develop a stiff neck.  Your feet or hands swell or have pain.  Your abdomen appears bloated.  You develop joint pain. MAKE SURE YOU:  Understand these instructions.   This information is not intended to replace advice given to you by your health care provider. Make sure you discuss any questions you have with your health care provider.   Document Released: 05/06/2005 Document Revised: 02/16/2014 Document Reviewed: 09/07/2012 Elsevier Interactive Patient Education 2016 Elsevier Inc.  

## 2015-06-05 NOTE — ED Notes (Addendum)
Patient c/o sickle cell pain in his chest, arms, neck, legs, and back x 2 days. Patient c/o slight SOB.
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
## 2015-06-05 NOTE — ED Notes (Signed)
Pt request for blood and IV line started at the same time

## 2015-06-06 ENCOUNTER — Telehealth: Payer: Self-pay

## 2015-06-06 NOTE — Telephone Encounter (Signed)
Pt called requesting medication refill on Oxycodone, 20mg  and Hydroxyril. Pt stated that he only had money to pay for partial refill of his Oxycodone the last time and only has 6 left. Thanks!

## 2015-06-07 ENCOUNTER — Other Ambulatory Visit: Payer: Self-pay | Admitting: *Deleted

## 2015-06-07 NOTE — Telephone Encounter (Signed)
Patient has refills on Hydroxyurea and Patient needs to obtain the other portion of his Oxycodone. Medication can not be refilled for another 2 weeks.

## 2015-06-07 NOTE — Telephone Encounter (Signed)
Patient received a refill on 05/21/15. Patient should contact pharmacy for refill on Hydroxyurea and pick up remaining Oxycodone.

## 2015-06-08 ENCOUNTER — Emergency Department (HOSPITAL_COMMUNITY): Payer: Medicaid - Out of State

## 2015-06-08 ENCOUNTER — Encounter (HOSPITAL_COMMUNITY): Payer: Self-pay

## 2015-06-08 ENCOUNTER — Emergency Department (HOSPITAL_COMMUNITY)
Admission: EM | Admit: 2015-06-08 | Discharge: 2015-06-08 | Disposition: A | Discharge status: Home / Self Care | Payer: Medicaid - Out of State | Attending: Emergency Medicine | Admitting: Emergency Medicine

## 2015-06-08 DIAGNOSIS — Z79899 Other long term (current) drug therapy: Secondary | ICD-10-CM | POA: Diagnosis not present

## 2015-06-08 DIAGNOSIS — F17210 Nicotine dependence, cigarettes, uncomplicated: Secondary | ICD-10-CM | POA: Diagnosis not present

## 2015-06-08 DIAGNOSIS — D5700 Hb-SS disease with crisis, unspecified: Secondary | ICD-10-CM | POA: Insufficient documentation

## 2015-06-08 DIAGNOSIS — G8929 Other chronic pain: Secondary | ICD-10-CM | POA: Diagnosis not present

## 2015-06-08 LAB — CBC WITH DIFFERENTIAL/PLATELET
BASOS ABS: 0.1 10*3/uL (ref 0.0–0.1)
Basophils Relative: 1 %
EOS PCT: 2 %
Eosinophils Absolute: 0.3 10*3/uL (ref 0.0–0.7)
HEMATOCRIT: 23.9 % — AB (ref 39.0–52.0)
Hemoglobin: 8.4 g/dL — ABNORMAL LOW (ref 13.0–17.0)
LYMPHS ABS: 3 10*3/uL (ref 0.7–4.0)
LYMPHS PCT: 21 %
MCH: 32.1 pg (ref 26.0–34.0)
MCHC: 35.1 g/dL (ref 30.0–36.0)
MCV: 91.2 fL (ref 78.0–100.0)
Monocytes Absolute: 1.6 10*3/uL — ABNORMAL HIGH (ref 0.1–1.0)
Monocytes Relative: 11 %
NEUTROS ABS: 9.4 10*3/uL — AB (ref 1.7–7.7)
Neutrophils Relative %: 65 %
Platelets: 369 10*3/uL (ref 150–400)
RBC: 2.62 MIL/uL — AB (ref 4.22–5.81)
RDW: 19.5 % — ABNORMAL HIGH (ref 11.5–15.5)
WBC: 14.3 10*3/uL — AB (ref 4.0–10.5)

## 2015-06-08 LAB — COMPREHENSIVE METABOLIC PANEL
ALBUMIN: 4.6 g/dL (ref 3.5–5.0)
ALT: 22 U/L (ref 17–63)
ANION GAP: 6 (ref 5–15)
AST: 31 U/L (ref 15–41)
Alkaline Phosphatase: 89 U/L (ref 38–126)
BILIRUBIN TOTAL: 3.8 mg/dL — AB (ref 0.3–1.2)
BUN: 7 mg/dL (ref 6–20)
CHLORIDE: 107 mmol/L (ref 101–111)
CO2: 27 mmol/L (ref 22–32)
Calcium: 9.7 mg/dL (ref 8.9–10.3)
Creatinine, Ser: 0.41 mg/dL — ABNORMAL LOW (ref 0.61–1.24)
GFR calc Af Amer: >60 mL/min (ref >60)
GFR calc non Af Amer: >60 mL/min (ref >60)
GLUCOSE: 84 mg/dL (ref 65–99)
POTASSIUM: 4.2 mmol/L (ref 3.5–5.1)
Sodium: 140 mmol/L (ref 135–145)
TOTAL PROTEIN: 8.3 g/dL — AB (ref 6.5–8.1)

## 2015-06-08 LAB — RETICULOCYTES
RBC.: 2.62 MIL/uL — AB (ref 4.22–5.81)
Retic Count, Absolute: 450.6 10*3/uL — ABNORMAL HIGH (ref 19.0–186.0)
Retic Ct Pct: 17.2 % — ABNORMAL HIGH (ref 0.4–3.1)

## 2015-06-08 MED ORDER — HYDROMORPHONE HCL 2 MG/ML IJ SOLN
2.0000 mg | INTRAMUSCULAR | Status: DC | PRN
  Filled 2015-06-08: qty 1

## 2015-06-08 MED ORDER — METOCLOPRAMIDE HCL 5 MG/ML IJ SOLN
10.0000 mg | Freq: Once | INTRAMUSCULAR | Status: AC
  Administered 2015-06-08: 10 mg via INTRAVENOUS
  Filled 2015-06-08: qty 2

## 2015-06-08 MED ORDER — DIPHENHYDRAMINE HCL 50 MG/ML IJ SOLN
25.0000 mg | Freq: Once | INTRAMUSCULAR | Status: AC
Start: 2015-06-08 — End: 2015-06-08
  Administered 2015-06-08: 25 mg via INTRAVENOUS
  Filled 2015-06-08: qty 1

## 2015-06-08 MED ORDER — DIPHENHYDRAMINE HCL 50 MG/ML IJ SOLN
25.0000 mg | Freq: Once | INTRAMUSCULAR | Status: AC
  Administered 2015-06-08: 25 mg via INTRAVENOUS
  Filled 2015-06-08: qty 1

## 2015-06-08 MED ORDER — HYDROMORPHONE HCL 2 MG/ML IJ SOLN
3.0000 mg | INTRAMUSCULAR | Status: AC
  Administered 2015-06-08 (×3): 3 mg via INTRAVENOUS
  Filled 2015-06-08 (×3): qty 2

## 2015-06-08 MED ORDER — DEXTROSE-NACL 5-0.45 % IV SOLN
INTRAVENOUS | Status: DC
  Administered 2015-06-08: 1000 mL via INTRAVENOUS

## 2015-06-08 MED ORDER — OXYCODONE HCL 5 MG PO TABS
5.0000 mg | ORAL_TABLET | Freq: Once | ORAL | Status: AC
Start: 2015-06-08 — End: 2015-06-08
  Administered 2015-06-08: 5 mg via ORAL
  Filled 2015-06-08: qty 1

## 2015-06-08 MED ORDER — KETOROLAC TROMETHAMINE 30 MG/ML IJ SOLN
30.0000 mg | INTRAMUSCULAR | Status: AC
  Administered 2015-06-08: 30 mg via INTRAVENOUS
  Filled 2015-06-08: qty 1

## 2015-06-08 NOTE — ED Notes (Signed)
Pt given Spirite and peanut and crackers. Tolerated well.

## 2015-06-08 NOTE — Discharge Instructions (Signed)
Sickle Cell Anemia, Adult °Sickle cell anemia is a condition in which red blood cells have an abnormal "sickle" shape. This abnormal shape shortens the cells' life span, which results in a lower than normal concentration of red blood cells in the blood. The sickle shape also causes the cells to clump together and block free blood flow through the blood vessels. As a result, the tissues and organs of the body do not receive enough oxygen. Sickle cell anemia causes organ damage and pain and increases the risk of infection. °CAUSES  °Sickle cell anemia is a genetic disorder. Those who receive two copies of the gene have the condition, and those who receive one copy have the trait. °RISK FACTORS °The sickle cell gene is most common in people whose families originated in [place]. Other areas of the globe where sickle cell trait occurs include the Mediterranean, [place] and [place], the [place], and the [place].  °SIGNS AND SYMPTOMS °· Pain, especially in the extremities, back, chest, or abdomen (common). The pain may start suddenly or may develop following an illness, especially if there is dehydration. Pain can also occur due to overexertion or exposure to extreme temperature changes. °· Frequent severe bacterial infections, especially certain types of pneumonia and meningitis. °· Pain and swelling in the hands and feet. °· Decreased activity.   °· Loss of appetite.   °· Change in behavior. °· Headaches. °· Seizures. °· Shortness of breath or difficulty breathing. °· Vision changes. °· Skin ulcers. °Those with the trait may not have symptoms or they may have mild symptoms.  °DIAGNOSIS  °Sickle cell anemia is diagnosed with blood tests that demonstrate the genetic trait. It is often diagnosed during the newborn period, due to mandatory testing nationwide. A variety of blood tests, X-rays, CT scans, MRI scans, ultrasounds, and lung function tests may also be done to monitor the condition. °TREATMENT  °Sickle  cell anemia may be treated with: °· Medicines. You may be given pain medicines, antibiotic medicines (to treat and prevent infections) or medicines to increase the production of certain types of hemoglobin. °· Fluids. °· Oxygen. °· Blood transfusions. °HOME CARE INSTRUCTIONS  °· Drink enough fluid to keep your urine clear or pale yellow. Increase your fluid intake in hot weather and during exercise. °· Do not smoke. Smoking lowers oxygen levels in the blood.   °· Only take over-the-counter or prescription medicines for pain, fever, or discomfort as directed by your health care provider. °· Take antibiotics as directed by your health care provider. Make sure you finish them it even if you start to feel better.   °· Take supplements as directed by your health care provider.   °· Consider wearing a medical alert bracelet. This tells anyone caring for you in an emergency of your condition.   °· When traveling, keep your medical information, health care provider's names, and the medicines you take with you at all times.   °· If you develop a fever, do not take medicines to reduce the fever right away. This could cover up a problem that is developing. Notify your health care provider. °· Keep all follow-up appointments with your health care provider. Sickle cell anemia requires regular medical care. °SEEK MEDICAL CARE IF: ° You have a fever. °SEEK IMMEDIATE MEDICAL CARE IF:  °· You feel dizzy or faint.   °· You have new abdominal pain, especially on the left side near the stomach area.   °· You develop a persistent, often uncomfortable and painful penile erection (priapism). If this is not treated immediately it   will lead to impotence.   You have numbness your arms or legs or you have a hard time moving them.   You have a hard time with speech.   You have a fever or persistent symptoms for more than 2-3 days.   You have a fever and your symptoms suddenly get worse.   You have signs or symptoms of infection.  These include:   Chills.   Abnormal tiredness (lethargy).   Irritability.   Poor eating.   Vomiting.   You develop pain that is not helped with medicine.   You develop shortness of breath.  You have pain in your chest.   You are coughing up pus-like or bloody sputum.   You develop a stiff neck.  Your feet or hands swell or have pain.  Your abdomen appears bloated.  You develop joint pain. MAKE SURE YOU:  Understand these instructions.   This information is not intended to replace advice given to you by your health care provider. Make sure you discuss any questions you have with your health care provider.   Document Released: 05/06/2005 Document Revised: 02/16/2014 Document Reviewed: 09/07/2012 Elsevier Interactive Patient Education 2016 Elsevier Inc. Chronic Pain Chronic pain can be defined as pain that is off and on and lasts for 3-6 months or longer. Many things cause chronic pain, which can make it difficult to make a diagnosis. There are many treatment options available for chronic pain. However, finding a treatment that works well for you may require trying various approaches until the right one is found. Many people benefit from a combination of two or more types of treatment to control their pain. SYMPTOMS  Chronic pain can occur anywhere in the body and can range from mild to very severe. Some types of chronic pain include:  Headache.  Low back pain.  Cancer pain.  Arthritis pain.  Neurogenic pain. This is pain resulting from damage to nerves. People with chronic pain may also have other symptoms such as:  Depression.  Anger.  Insomnia.  Anxiety. DIAGNOSIS  Your health care provider will help diagnose your condition over time. In many cases, the initial focus will be on excluding possible conditions that could be causing the pain. Depending on your symptoms, your health care provider may order tests to diagnose your condition. Some of  these tests may include:   Blood tests.   CT scan.   MRI.   X-rays.   Ultrasounds.   Nerve conduction studies.  You may need to see a specialist.  TREATMENT  Finding treatment that works well may take time. You may be referred to a pain specialist. He or she may prescribe medicine or therapies, such as:   Mindful meditation or yoga.  Shots (injections) of numbing or pain-relieving medicines into the spine or area of pain.  Local electrical stimulation.  Acupuncture.   Massage therapy.   Aroma, color, light, or sound therapy.   Biofeedback.   Working with a physical therapist to keep from getting stiff.   Regular, gentle exercise.   Cognitive or behavioral therapy.   Group support.  Sometimes, surgery may be recommended.  HOME CARE INSTRUCTIONS   Take all medicines as directed by your health care provider.   Lessen stress in your life by relaxing and doing things such as listening to calming music.   Exercise or be active as directed by your health care provider.   Eat a healthy diet and include things such as vegetables, fruits, fish, and lean   lean meats in your diet.   Keep all follow-up appointments with your health care provider.   Attend a support group with others suffering from chronic pain. SEEK MEDICAL CARE IF:   Your pain gets worse.   You develop a new pain that was not there before.   You cannot tolerate medicines given to you by your health care provider.   You have new symptoms since your last visit with your health care provider.  SEEK IMMEDIATE MEDICAL CARE IF:   You feel weak.   You have decreased sensation or numbness.   You lose control of bowel or bladder function.   Your pain suddenly gets much worse.   You develop shaking.  You develop chills.  You develop confusion.  You develop chest pain.  You develop shortness of breath.  MAKE SURE YOU:  Understand these instructions.  Will watch your  condition.  Will get help right away if you are not doing well or get worse.   This information is not intended to replace advice given to you by your health care provider. Make sure you discuss any questions you have with your health care provider.   Document Released: 10/18/2001 Document Revised: 09/28/2012 Document Reviewed: 07/22/2012 Elsevier Interactive Patient Education [employer]2016 Elsevier Inc.

## 2015-06-08 NOTE — ED Notes (Signed)
Attempted IV venipuncture x1 and was unsuccessful. Will have another nurse to attempt. 

## 2015-06-08 NOTE — ED Notes (Signed)
[facility]

## 2015-06-08 NOTE — ED Notes (Signed)
Pt reported to continue to have itching/nausea. Will make Dr. Josem KaufmannPfeffier aware. Pt requesting crackers and peanut to eat with Spirite to drink.

## 2015-06-08 NOTE — ED Notes (Signed)
Awake. Verbally responsive. A/O x4. Resp even and unlabored. No audible adventitious breath sounds noted. ABC's intact.  
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
## 2015-06-08 NOTE — ED Notes (Signed)
Patient transported to X-ray 

## 2015-06-08 NOTE — ED Notes (Signed)
He c/o chest and mid-back pain for several days which he recognizes as sickle cell pain.  He is in no distress; and his skin is normal, warm and dry and he is breathing normally.

## 2015-06-09 ENCOUNTER — Encounter (HOSPITAL_COMMUNITY): Payer: Self-pay | Admitting: Emergency Medicine

## 2015-06-09 ENCOUNTER — Emergency Department (HOSPITAL_COMMUNITY)
Admission: EM | Admit: 2015-06-09 | Discharge: 2015-06-09 | Discharge status: Against Medical Advice | Payer: Medicaid - Out of State | Attending: Emergency Medicine | Admitting: Emergency Medicine

## 2015-06-09 DIAGNOSIS — Z79899 Other long term (current) drug therapy: Secondary | ICD-10-CM | POA: Insufficient documentation

## 2015-06-09 DIAGNOSIS — F17210 Nicotine dependence, cigarettes, uncomplicated: Secondary | ICD-10-CM | POA: Insufficient documentation

## 2015-06-09 DIAGNOSIS — M79606 Pain in leg, unspecified: Secondary | ICD-10-CM

## 2015-06-09 DIAGNOSIS — R17 Unspecified jaundice: Secondary | ICD-10-CM | POA: Insufficient documentation

## 2015-06-09 DIAGNOSIS — D849 Immunodeficiency, unspecified: Secondary | ICD-10-CM | POA: Diagnosis not present

## 2015-06-09 DIAGNOSIS — D5700 Hb-SS disease with crisis, unspecified: Secondary | ICD-10-CM | POA: Diagnosis present

## 2015-06-09 DIAGNOSIS — M5489 Other dorsalgia: Secondary | ICD-10-CM

## 2015-06-09 DIAGNOSIS — Z79891 Long term (current) use of opiate analgesic: Secondary | ICD-10-CM | POA: Diagnosis not present

## 2015-06-09 DIAGNOSIS — R110 Nausea: Secondary | ICD-10-CM | POA: Diagnosis not present

## 2015-06-09 DIAGNOSIS — E860 Dehydration: Secondary | ICD-10-CM | POA: Insufficient documentation

## 2015-06-09 MED ORDER — DEXTROSE-NACL 5-0.45 % IV SOLN
INTRAVENOUS | Status: DC
  Administered 2015-06-09: 17:00:00 via INTRAVENOUS

## 2015-06-09 MED ORDER — KETOROLAC TROMETHAMINE 30 MG/ML IJ SOLN
30.0000 mg | INTRAMUSCULAR | Status: AC
  Administered 2015-06-09: 30 mg via INTRAVENOUS
  Filled 2015-06-09: qty 1

## 2015-06-09 MED ORDER — HYDROMORPHONE HCL 2 MG/ML IJ SOLN
3.0000 mg | INTRAMUSCULAR | Status: DC | PRN
  Administered 2015-06-09: 3 mg via INTRAVENOUS
  Filled 2015-06-09: qty 2

## 2015-06-09 MED ORDER — PROCHLORPERAZINE EDISYLATE 5 MG/ML IJ SOLN
10.0000 mg | Freq: Once | INTRAMUSCULAR | Status: AC
  Administered 2015-06-09: 10 mg via INTRAVENOUS
  Filled 2015-06-09: qty 2

## 2015-06-09 MED ORDER — DIPHENHYDRAMINE HCL 25 MG PO CAPS
25.0000 mg | ORAL_CAPSULE | ORAL | Status: DC | PRN
  Administered 2015-06-09: 25 mg via ORAL
  Filled 2015-06-09: qty 1

## 2015-06-09 NOTE — ED Notes (Signed)
Pt removed his own IV and left the room. Pt did not alert staff that he was leaving.
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
## 2015-06-09 NOTE — ED Notes (Signed)
Pt reports ongoing sickle cell crisis pain in legs and back. Was here for same yesterday.

## 2015-06-10 ENCOUNTER — Encounter (HOSPITAL_COMMUNITY): Payer: Self-pay

## 2015-06-10 ENCOUNTER — Emergency Department (HOSPITAL_COMMUNITY)
Admission: EM | Admit: 2015-06-10 | Discharge: 2015-06-10 | Discharge status: Against Medical Advice | Payer: Medicaid - Out of State | Attending: Emergency Medicine | Admitting: Emergency Medicine

## 2015-06-10 DIAGNOSIS — F17210 Nicotine dependence, cigarettes, uncomplicated: Secondary | ICD-10-CM | POA: Diagnosis not present

## 2015-06-10 DIAGNOSIS — Z79899 Other long term (current) drug therapy: Secondary | ICD-10-CM | POA: Insufficient documentation

## 2015-06-10 DIAGNOSIS — D5700 Hb-SS disease with crisis, unspecified: Secondary | ICD-10-CM

## 2015-06-10 LAB — COMPREHENSIVE METABOLIC PANEL
ALBUMIN: 4.6 g/dL (ref 3.5–5.0)
ALK PHOS: 83 U/L (ref 38–126)
ALT: 16 U/L — AB (ref 17–63)
AST: 26 U/L (ref 15–41)
Anion gap: 10 (ref 5–15)
BUN: 6 mg/dL (ref 6–20)
CALCIUM: 9.5 mg/dL (ref 8.9–10.3)
CO2: 24 mmol/L (ref 22–32)
CREATININE: 0.44 mg/dL — AB (ref 0.61–1.24)
Chloride: 107 mmol/L (ref 101–111)
GFR calc Af Amer: >60 mL/min (ref >60)
GFR calc non Af Amer: >60 mL/min (ref >60)
GLUCOSE: 88 mg/dL (ref 65–99)
Potassium: 4.1 mmol/L (ref 3.5–5.1)
SODIUM: 141 mmol/L (ref 135–145)
Total Bilirubin: 2.9 mg/dL — ABNORMAL HIGH (ref 0.3–1.2)
Total Protein: 8 g/dL (ref 6.5–8.1)

## 2015-06-10 LAB — CBC WITH DIFFERENTIAL/PLATELET
BASOS PCT: 0 %
Basophils Absolute: 0 10*3/uL (ref 0.0–0.1)
EOS PCT: 2 %
Eosinophils Absolute: 0.3 10*3/uL (ref 0.0–0.7)
HCT: 23.8 % — ABNORMAL LOW (ref 39.0–52.0)
HEMOGLOBIN: 8.5 g/dL — AB (ref 13.0–17.0)
LYMPHS ABS: 3 10*3/uL (ref 0.7–4.0)
Lymphocytes Relative: 18 %
MCH: 33.2 pg (ref 26.0–34.0)
MCHC: 35.7 g/dL (ref 30.0–36.0)
MCV: 93 fL (ref 78.0–100.0)
MONOS PCT: 9 %
Monocytes Absolute: 1.5 10*3/uL — ABNORMAL HIGH (ref 0.1–1.0)
NEUTROS PCT: 71 %
Neutro Abs: 11.7 10*3/uL — ABNORMAL HIGH (ref 1.7–7.7)
Platelets: 356 10*3/uL (ref 150–400)
RBC: 2.56 MIL/uL — AB (ref 4.22–5.81)
RDW: 18.4 % — ABNORMAL HIGH (ref 11.5–15.5)
WBC: 16.5 10*3/uL — AB (ref 4.0–10.5)

## 2015-06-10 LAB — RETICULOCYTES
RBC.: 2.56 MIL/uL — ABNORMAL LOW (ref 4.22–5.81)
Retic Count, Absolute: 389.1 10*3/uL — ABNORMAL HIGH (ref 19.0–186.0)
Retic Ct Pct: 15.2 % — ABNORMAL HIGH (ref 0.4–3.1)

## 2015-06-10 MED ORDER — PROMETHAZINE HCL 25 MG PO TABS
25.0000 mg | ORAL_TABLET | ORAL | Status: DC | PRN
  Administered 2015-06-10: 25 mg via ORAL
  Filled 2015-06-10 (×2): qty 1

## 2015-06-10 MED ORDER — DEXTROSE-NACL 5-0.45 % IV SOLN
INTRAVENOUS | Status: DC
  Administered 2015-06-10: 14:00:00 via INTRAVENOUS

## 2015-06-10 MED ORDER — DIPHENHYDRAMINE HCL 50 MG/ML IJ SOLN
25.0000 mg | Freq: Once | INTRAMUSCULAR | Status: AC
  Administered 2015-06-10: 25 mg via INTRAVENOUS
  Filled 2015-06-10: qty 1

## 2015-06-10 MED ORDER — KETOROLAC TROMETHAMINE 30 MG/ML IJ SOLN
30.0000 mg | INTRAMUSCULAR | Status: AC
  Administered 2015-06-10: 30 mg via INTRAVENOUS
  Filled 2015-06-10: qty 1

## 2015-06-10 MED ORDER — HYDROMORPHONE HCL 2 MG/ML IJ SOLN
0.0375 mg/kg | INTRAMUSCULAR | Status: AC
  Administered 2015-06-10: 2.3 mg via INTRAVENOUS
  Filled 2015-06-10: qty 2

## 2015-06-10 MED ORDER — HYDROMORPHONE HCL 2 MG/ML IJ SOLN: 0.0313 mg/kg | INTRAMUSCULAR | Status: AC

## 2015-06-10 MED ORDER — HYDROMORPHONE HCL 2 MG/ML IJ SOLN: 0.0250 mg/kg | INTRAMUSCULAR | Status: AC

## 2015-06-10 MED ORDER — HYDROMORPHONE HCL 2 MG/ML IJ SOLN
0.0250 mg/kg | INTRAMUSCULAR | Status: AC
  Administered 2015-06-10: 1.5 mg via INTRAVENOUS
  Filled 2015-06-10: qty 1

## 2015-06-10 MED ORDER — HYDROMORPHONE HCL 2 MG/ML IJ SOLN: 0.0375 mg/kg | INTRAMUSCULAR | Status: AC

## 2015-06-10 MED ORDER — HYDROMORPHONE HCL 2 MG/ML IJ SOLN
0.0313 mg/kg | INTRAMUSCULAR | Status: AC
  Administered 2015-06-10: 1.9 mg via INTRAVENOUS
  Filled 2015-06-10: qty 1

## 2015-06-10 NOTE — ED Notes (Signed)
UNABLE TO COLLECT LABS PATIENT WANTS TO WAIT UNTIL HE GETS AN IV

## 2015-06-10 NOTE — Progress Notes (Addendum)
Pt with [facility] ED visits and 8 admissions in the last 6 months Pt continues with medicaid of [place] coverage This pt has been seen and provided resources by Case management providers but is non compliant Pt last Office visit was on on 05/21/15 [facility]  Pt has an ED CP Pt has an upcoming appt - entered in d/c instructions: Matthew Angstlugbemiga E Jegede, MD Go on 07/23/2015 you have a scheduled 2 month follow up appointment with Dr Hyman HopesJegede on 07/23/15 at 1100 [address] [place] [place] [address] [phone]

## 2015-06-10 NOTE — ED Notes (Signed)
H[place] MD reports that she found Pt's IV and gown laying on bed when she went to reassess him.  Pt no where to be found.  It is assumed he left.

## 2015-06-10 NOTE — ED Notes (Signed)
Patient c/o sickle cell pain upper, mid, and lower back, and bilateral legs. Patient was seen yesterday for the same. Patient also c/o SOB.

## 2015-06-10 NOTE — ED Notes (Signed)
Pt is asking for Nausea meds - will notify RN

## 2015-06-10 NOTE — Discharge Instructions (Signed)

## 2015-06-10 NOTE — ED Notes (Signed)
2x attempt at urine sample. Pt stated he will try to void.

## 2015-06-10 NOTE — ED Notes (Signed)
Pt is aware we need a urine sample. States he doesn't have to go at this time, but will notify when he can void.
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
## 2015-06-11 ENCOUNTER — Telehealth (HOSPITAL_COMMUNITY): Payer: Self-pay | Admitting: *Deleted

## 2015-06-11 ENCOUNTER — Other Ambulatory Visit: Payer: Self-pay | Admitting: Internal Medicine

## 2015-06-11 NOTE — Telephone Encounter (Signed)
Returned call to patient after speaking with Dr. Hyman HopesJegede. After researching patient's visits to the ED on 4/26, 4/29,4/30 and 06/10/15 Dr. Hyman HopesJegede recommends for patient to continue taking the pain medication that was prescribed during the ED visits, hydrate and rest and keep follow up appointments as scheduled... Explained this to the patient. Patient is saying he didn't get any prescriptions while in the ED. Offered for him to speak with the office staff about making an appointment to come to the office visit sooner and he agreed.

## 2015-06-11 NOTE — Telephone Encounter (Signed)
[facility] 7/10 in back and bilateral legs. Wants to come in for treatment. Denies fever, N/V, diarrhea, priapism. Has mild chest pain. States "I am out of pain medication. The last pain medication I took was on Thursday. I called on Friday for my refill but no one has called me back." Informed patient that I would speak with the medical provider and call him back.

## 2015-06-14 ENCOUNTER — Encounter (HOSPITAL_COMMUNITY): Payer: Self-pay | Admitting: *Deleted

## 2015-06-14 ENCOUNTER — Emergency Department (HOSPITAL_COMMUNITY): Payer: Medicaid - Out of State

## 2015-06-14 ENCOUNTER — Inpatient Hospital Stay (HOSPITAL_COMMUNITY)
Admission: EM | Admit: 2015-06-14 | Discharge: 2015-06-15 | DRG: 812 | Discharge status: Against Medical Advice | Payer: Medicaid - Out of State | Attending: Internal Medicine | Admitting: Internal Medicine

## 2015-06-14 ENCOUNTER — Telehealth (HOSPITAL_COMMUNITY): Payer: Self-pay | Admitting: *Deleted

## 2015-06-14 ENCOUNTER — Telehealth (HOSPITAL_COMMUNITY): Payer: Self-pay | Admitting: Hematology

## 2015-06-14 DIAGNOSIS — F1123 Opioid dependence with withdrawal: Secondary | ICD-10-CM | POA: Diagnosis present

## 2015-06-14 DIAGNOSIS — G8929 Other chronic pain: Secondary | ICD-10-CM | POA: Diagnosis present

## 2015-06-14 DIAGNOSIS — Z833 Family history of diabetes mellitus: Secondary | ICD-10-CM | POA: Diagnosis not present

## 2015-06-14 DIAGNOSIS — D638 Anemia in other chronic diseases classified elsewhere: Secondary | ICD-10-CM | POA: Diagnosis present

## 2015-06-14 DIAGNOSIS — F17210 Nicotine dependence, cigarettes, uncomplicated: Secondary | ICD-10-CM | POA: Diagnosis present

## 2015-06-14 DIAGNOSIS — Z79899 Other long term (current) drug therapy: Secondary | ICD-10-CM | POA: Diagnosis not present

## 2015-06-14 DIAGNOSIS — Z720 Tobacco use: Secondary | ICD-10-CM | POA: Diagnosis not present

## 2015-06-14 DIAGNOSIS — R079 Chest pain, unspecified: Secondary | ICD-10-CM

## 2015-06-14 DIAGNOSIS — D72828 Other elevated white blood cell count: Secondary | ICD-10-CM | POA: Diagnosis present

## 2015-06-14 DIAGNOSIS — D5700 Hb-SS disease with crisis, unspecified: Secondary | ICD-10-CM | POA: Diagnosis not present

## 2015-06-14 DIAGNOSIS — R0902 Hypoxemia: Secondary | ICD-10-CM | POA: Diagnosis present

## 2015-06-14 DIAGNOSIS — Z825 Family history of asthma and other chronic lower respiratory diseases: Secondary | ICD-10-CM

## 2015-06-14 DIAGNOSIS — D72829 Elevated white blood cell count, unspecified: Secondary | ICD-10-CM | POA: Diagnosis not present

## 2015-06-14 DIAGNOSIS — J9811 Atelectasis: Secondary | ICD-10-CM | POA: Diagnosis present

## 2015-06-14 DIAGNOSIS — M549 Dorsalgia, unspecified: Secondary | ICD-10-CM | POA: Diagnosis present

## 2015-06-14 DIAGNOSIS — Z832 Family history of diseases of the blood and blood-forming organs and certain disorders involving the immune mechanism: Secondary | ICD-10-CM | POA: Diagnosis not present

## 2015-06-14 LAB — COMPREHENSIVE METABOLIC PANEL
ALBUMIN: 4.9 g/dL (ref 3.5–5.0)
ALK PHOS: 84 U/L (ref 38–126)
ALT: 10 U/L — ABNORMAL LOW (ref 17–63)
ANION GAP: 8 (ref 5–15)
AST: 32 U/L (ref 15–41)
BILIRUBIN TOTAL: 4.7 mg/dL — AB (ref 0.3–1.2)
BUN: 7 mg/dL (ref 6–20)
CALCIUM: 9.7 mg/dL (ref 8.9–10.3)
CO2: 25 mmol/L (ref 22–32)
Chloride: 110 mmol/L (ref 101–111)
Creatinine, Ser: 0.53 mg/dL — ABNORMAL LOW (ref 0.61–1.24)
GLUCOSE: 84 mg/dL (ref 65–99)
Potassium: 4.5 mmol/L (ref 3.5–5.1)
Sodium: 143 mmol/L (ref 135–145)
TOTAL PROTEIN: 8.2 g/dL — AB (ref 6.5–8.1)

## 2015-06-14 LAB — CBC WITH DIFFERENTIAL/PLATELET
BASOS ABS: 0.1 10*3/uL (ref 0.0–0.1)
Basophils Relative: 1 %
Eosinophils Absolute: 0.3 10*3/uL (ref 0.0–0.7)
Eosinophils Relative: 2 %
HEMATOCRIT: 25.7 % — AB (ref 39.0–52.0)
Hemoglobin: 9.3 g/dL — ABNORMAL LOW (ref 13.0–17.0)
LYMPHS PCT: 20 %
Lymphs Abs: 3.2 10*3/uL (ref 0.7–4.0)
MCH: 33.5 pg (ref 26.0–34.0)
MCHC: 36.2 g/dL — ABNORMAL HIGH (ref 30.0–36.0)
MCV: 92.4 fL (ref 78.0–100.0)
MONO ABS: 1.7 10*3/uL — AB (ref 0.1–1.0)
Monocytes Relative: 11 %
NEUTROS ABS: 11.3 10*3/uL — AB (ref 1.7–7.7)
NEUTROS PCT: 68 %
Platelets: 316 10*3/uL (ref 150–400)
RBC: 2.78 MIL/uL — AB (ref 4.22–5.81)
RDW: 18 % — AB (ref 11.5–15.5)
WBC: 16.6 10*3/uL — AB (ref 4.0–10.5)

## 2015-06-14 LAB — RETICULOCYTES
RBC.: 2.78 MIL/uL — AB (ref 4.22–5.81)
RETIC COUNT ABSOLUTE: 442 10*3/uL — AB (ref 19.0–186.0)
Retic Ct Pct: 15.9 % — ABNORMAL HIGH (ref 0.4–3.1)

## 2015-06-14 MED ORDER — PIPERACILLIN-TAZOBACTAM 3.375 G IVPB: 3.3750 g | Freq: Three times a day (TID) | INTRAVENOUS | Status: DC

## 2015-06-14 MED ORDER — HYDROMORPHONE HCL 2 MG/ML IJ SOLN
3.0000 mg | Freq: Once | INTRAMUSCULAR | Status: AC
  Administered 2015-06-14: 3 mg via INTRAVENOUS
  Filled 2015-06-14: qty 2

## 2015-06-14 MED ORDER — SODIUM CHLORIDE 0.9 % IV SOLN
12.5000 mg | Freq: Four times a day (QID) | INTRAVENOUS | Status: DC | PRN
  Filled 2015-06-14: qty 0.25

## 2015-06-14 MED ORDER — ONDANSETRON HCL 4 MG/2ML IJ SOLN: 4.0000 mg | INTRAMUSCULAR | Status: DC | PRN

## 2015-06-14 MED ORDER — IBUPROFEN 800 MG PO TABS: 800.0000 mg | ORAL_TABLET | Freq: Three times a day (TID) | ORAL | Status: DC | PRN

## 2015-06-14 MED ORDER — PROCHLORPERAZINE EDISYLATE 5 MG/ML IJ SOLN
10.0000 mg | INTRAMUSCULAR | Status: AC
  Administered 2015-06-14: 10 mg via INTRAVENOUS
  Filled 2015-06-14: qty 2

## 2015-06-14 MED ORDER — ONDANSETRON HCL 4 MG/2ML IJ SOLN
4.0000 mg | Freq: Once | INTRAMUSCULAR | Status: AC
  Administered 2015-06-14: 4 mg via INTRAVENOUS
  Filled 2015-06-14: qty 2

## 2015-06-14 MED ORDER — DIPHENHYDRAMINE HCL 12.5 MG/5ML PO ELIX: 12.5000 mg | ORAL_SOLUTION | Freq: Four times a day (QID) | ORAL | Status: DC | PRN

## 2015-06-14 MED ORDER — SENNOSIDES-DOCUSATE SODIUM 8.6-50 MG PO TABS
1.0000 | ORAL_TABLET | Freq: Two times a day (BID) | ORAL | Status: DC
  Filled 2015-06-14 (×2): qty 1

## 2015-06-14 MED ORDER — DEXTROSE-NACL 5-0.45 % IV SOLN
INTRAVENOUS | Status: DC
  Administered 2015-06-14: 12:00:00 via INTRAVENOUS

## 2015-06-14 MED ORDER — NALOXONE HCL 0.4 MG/ML IJ SOLN: 0.4000 mg | INTRAMUSCULAR | Status: DC | PRN

## 2015-06-14 MED ORDER — HYDROMORPHONE 1 MG/ML IV SOLN
INTRAVENOUS | Status: DC
  Administered 2015-06-14: 3.6 mg via INTRAVENOUS
  Administered 2015-06-14: 22:00:00 via INTRAVENOUS
  Administered 2015-06-15: 4.8 mg via INTRAVENOUS
  Administered 2015-06-15: 2.4 mg via INTRAVENOUS
  Filled 2015-06-14: qty 25

## 2015-06-14 MED ORDER — DIPHENHYDRAMINE HCL 50 MG/ML IJ SOLN
25.0000 mg | Freq: Once | INTRAMUSCULAR | Status: AC
  Administered 2015-06-14: 25 mg via INTRAVENOUS
  Filled 2015-06-14: qty 1

## 2015-06-14 MED ORDER — PIPERACILLIN-TAZOBACTAM 3.375 G IVPB 30 MIN
3.3750 g | Freq: Once | INTRAVENOUS | Status: DC
  Administered 2015-06-14: 3.375 g via INTRAVENOUS
  Filled 2015-06-14: qty 50

## 2015-06-14 MED ORDER — ENOXAPARIN SODIUM 40 MG/0.4ML ~~LOC~~ SOLN
40.0000 mg | SUBCUTANEOUS | Status: DC
  Filled 2015-06-14: qty 0.4

## 2015-06-14 MED ORDER — VANCOMYCIN HCL IN DEXTROSE 1-5 GM/200ML-% IV SOLN: 1000.0000 mg | Freq: Three times a day (TID) | INTRAVENOUS | Status: DC

## 2015-06-14 MED ORDER — HYDROXYUREA 500 MG PO CAPS
1000.0000 mg | ORAL_CAPSULE | Freq: Every day | ORAL | Status: DC
  Administered 2015-06-15: 1000 mg via ORAL
  Filled 2015-06-14: qty 2

## 2015-06-14 MED ORDER — DEXTROSE-NACL 5-0.45 % IV SOLN
INTRAVENOUS | Status: DC
  Administered 2015-06-14: via INTRAVENOUS

## 2015-06-14 MED ORDER — FOLIC ACID 1 MG PO TABS
1.0000 mg | ORAL_TABLET | Freq: Every day | ORAL | Status: DC
  Administered 2015-06-14 – 2015-06-15 (×2): 1 mg via ORAL
  Filled 2015-06-14 (×2): qty 1

## 2015-06-14 MED ORDER — ONDANSETRON HCL 4 MG PO TABS: 4.0000 mg | ORAL_TABLET | ORAL | Status: DC | PRN

## 2015-06-14 MED ORDER — KETOROLAC TROMETHAMINE 30 MG/ML IJ SOLN
30.0000 mg | Freq: Once | INTRAMUSCULAR | Status: AC
  Administered 2015-06-14: 30 mg via INTRAVENOUS
  Filled 2015-06-14: qty 1

## 2015-06-14 MED ORDER — POLYETHYLENE GLYCOL 3350 17 G PO PACK: 17.0000 g | PACK | Freq: Every day | ORAL | Status: DC | PRN

## 2015-06-14 MED ORDER — DIPHENHYDRAMINE HCL 25 MG PO CAPS
25.0000 mg | ORAL_CAPSULE | Freq: Once | ORAL | Status: AC
  Administered 2015-06-14: 25 mg via ORAL
  Filled 2015-06-14: qty 1

## 2015-06-14 MED ORDER — SODIUM CHLORIDE 0.9% FLUSH: 9.0000 mL | INTRAVENOUS | Status: DC | PRN

## 2015-06-14 MED ORDER — VANCOMYCIN HCL IN DEXTROSE 1-5 GM/200ML-% IV SOLN: 1000.0000 mg | Freq: Once | INTRAVENOUS | Status: DC

## 2015-06-14 NOTE — Progress Notes (Signed)
Pt with 47 [facility] ED visits and 8 admissions in the last 6 months Pt continues with medicaid of [place] coverage This pt has been seen and provided resources by Case management providers but is non compliant Pt last Office visit was on on 05/21/15 with Dr Hyman HopesJegede of [facility]  Pt has an ED CP [facility]   Pt has an upcoming appt - entered in d/c instructions: Quentin Angstlugbemiga E Jegede, MD Go on 07/23/2015 you have a scheduled 2 month follow up appointment with Dr Hyman HopesJegede on 07/23/15 at 1100 [address] [place] [place] [id number] [phone]

## 2015-06-14 NOTE — Telephone Encounter (Signed)
Patient calling in upset that he is in the ED and he can not be admitted.  Patient states his pain is not controlled and he doesn't know what else to do.  I explained to patient that the decision for admission is made my a medical doctor.  If an admission is not deemed necessary then there is nothing [facility].  I explained the importance of keeping his office visit appointment on 06/18/2015 @0900  with Dr. Hyman HopesJegede to go over/make changes with his medications.  Patient verbalizes understanding.

## 2015-06-14 NOTE — Progress Notes (Signed)
Pharmacy Antibiotic Follow-up Note  Matthew HarrisonBobby Shannon is a 25 y.o. year-old male admitted on 06/14/2015.  The patient is currently on day 1 of Vancomycin & Zosyn for r/o HCAP. To ED with sickle cell and chest pain, recent visits 4/26, 29,30 and 5/1.  Assessment/Plan: Vancomycin 1 IV every 8 hours.  Goal trough 15-20 mcg/mL. Zosyn 3.375g IV q8h (4 hour infusion).  Temp (24hrs), Avg:98.1 F (36.7 C), Min:98.1 F (36.7 C), Max:98.1 F (36.7 C)   Recent Labs Lab 06/08/15 1045 06/10/15 1431 06/14/15 1230  WBC 14.3* 16.5* 16.6*    Recent Labs Lab 06/08/15 1045 06/10/15 1431 06/14/15 1230  CREATININE 0.41* 0.44* 0.53*   Estimated Creatinine Clearance: 120.1 mL/min (by C-G formula based on Cr of 0.53).    No Known Allergies  Antimicrobials this admission: 5/5 Zosyn >>  5/5 Vancomycin  >>   Levels/dose changes this admission:  Microbiology results: None ordered yet  Thank you for allowing pharmacy to be a part of this patient's care.  Otho BellowsGreen, Richard Ritchey L PharmD 06/14/2015 1:36 PM

## 2015-06-14 NOTE — ED Notes (Signed)
Nurse at bedside starting IV will collect labs 

## 2015-06-14 NOTE — ED Notes (Signed)
Pt 02 dropped to 88 for a quick amount of time but mostly stayed in the mid 90s.

## 2015-06-14 NOTE — ED Notes (Signed)
Dr. Juleen ChinaKohut made aware of pt's desire for pain medication and admission.  Dr. Juleen ChinaKohut at bedside.
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
## 2015-06-14 NOTE — ED Notes (Signed)
Pt requesting benedryl.  Irving BurtonEmily PA made aware.

## 2015-06-14 NOTE — ED Notes (Signed)
Attempted to ambulate patient, patient stated he is "nauseated and lightheaded". RN Christeen DouglasMerle and PA Irving BurtonEmily notified.

## 2015-06-14 NOTE — Telephone Encounter (Signed)
[facility] of severe [facility] pain and other types of pain. Advised patient to go to the ED due to the severe chest pain. Patient voiced understanding.

## 2015-06-15 DIAGNOSIS — D72829 Elevated white blood cell count, unspecified: Secondary | ICD-10-CM

## 2015-06-15 DIAGNOSIS — Z720 Tobacco use: Secondary | ICD-10-CM

## 2015-06-15 DIAGNOSIS — D5700 Hb-SS disease with crisis, unspecified: Principal | ICD-10-CM

## 2015-06-15 DIAGNOSIS — R0902 Hypoxemia: Secondary | ICD-10-CM

## 2015-06-15 LAB — COMPREHENSIVE METABOLIC PANEL
ALK PHOS: 72 U/L (ref 38–126)
ALT: 8 U/L — ABNORMAL LOW (ref 17–63)
ANION GAP: 8 (ref 5–15)
AST: 18 U/L (ref 15–41)
Albumin: 3.9 g/dL (ref 3.5–5.0)
BUN: 6 mg/dL (ref 6–20)
CALCIUM: 8.9 mg/dL (ref 8.9–10.3)
CO2: 23 mmol/L (ref 22–32)
Chloride: 108 mmol/L (ref 101–111)
Creatinine, Ser: 0.47 mg/dL — ABNORMAL LOW (ref 0.61–1.24)
GFR calc non Af Amer: >60 mL/min (ref >60)
Glucose, Bld: 88 mg/dL (ref 65–99)
POTASSIUM: 3.6 mmol/L (ref 3.5–5.1)
Sodium: 139 mmol/L (ref 135–145)
Total Bilirubin: 4.3 mg/dL — ABNORMAL HIGH (ref 0.3–1.2)
Total Protein: 6.7 g/dL (ref 6.5–8.1)

## 2015-06-15 LAB — CBC WITH DIFFERENTIAL/PLATELET
BASOS PCT: 1 %
Basophils Absolute: 0.1 10*3/uL (ref 0.0–0.1)
EOS ABS: 0.6 10*3/uL (ref 0.0–0.7)
EOS PCT: 4 %
HCT: 21.5 % — ABNORMAL LOW (ref 39.0–52.0)
HEMOGLOBIN: 7.7 g/dL — AB (ref 13.0–17.0)
LYMPHS ABS: 3.3 10*3/uL (ref 0.7–4.0)
Lymphocytes Relative: 21 %
MCH: 33.3 pg (ref 26.0–34.0)
MCHC: 35.8 g/dL (ref 30.0–36.0)
MCV: 93.1 fL (ref 78.0–100.0)
MONOS PCT: 12 %
Monocytes Absolute: 1.9 10*3/uL — ABNORMAL HIGH (ref 0.1–1.0)
NEUTROS PCT: 62 %
Neutro Abs: 9.7 10*3/uL — ABNORMAL HIGH (ref 1.7–7.7)
PLATELETS: 262 10*3/uL (ref 150–400)
RBC: 2.31 MIL/uL — AB (ref 4.22–5.81)
RDW: 18.4 % — ABNORMAL HIGH (ref 11.5–15.5)
WBC: 15.6 10*3/uL — AB (ref 4.0–10.5)

## 2015-06-15 LAB — PHOSPHORUS: Phosphorus: 4.1 mg/dL (ref 2.5–4.6)

## 2015-06-15 LAB — RETICULOCYTES
RBC.: 2.31 MIL/uL — AB (ref 4.22–5.81)
RETIC CT PCT: 17.4 % — AB (ref 0.4–3.1)
Retic Count, Absolute: 401.9 10*3/uL — ABNORMAL HIGH (ref 19.0–186.0)

## 2015-06-15 LAB — MAGNESIUM: MAGNESIUM: 1.6 mg/dL — AB (ref 1.7–2.4)

## 2015-06-15 MED ORDER — IBUPROFEN 200 MG PO TABS
600.0000 mg | ORAL_TABLET | Freq: Four times a day (QID) | ORAL | Status: DC
Start: 2015-06-15 — End: 2015-06-15
  Administered 2015-06-15: 600 mg via ORAL
  Filled 2015-06-15: qty 3

## 2015-06-15 MED ORDER — DIPHENHYDRAMINE HCL 25 MG PO CAPS: 25.0000 mg | ORAL_CAPSULE | Freq: Four times a day (QID) | ORAL | Status: DC | PRN

## 2015-06-15 MED ORDER — HYDROMORPHONE 1 MG/ML IV SOLN: INTRAVENOUS | Status: DC

## 2015-06-15 MED ORDER — KETOROLAC TROMETHAMINE 30 MG/ML IJ SOLN: 30.0000 mg | Freq: Four times a day (QID) | INTRAMUSCULAR | Status: DC

## 2015-06-15 MED ORDER — MORPHINE SULFATE ER 30 MG PO TBCR
30.0000 mg | EXTENDED_RELEASE_TABLET | Freq: Two times a day (BID) | ORAL | Status: DC
  Administered 2015-06-15: 30 mg via ORAL
  Filled 2015-06-15: qty 1

## 2015-06-15 MED ORDER — HYDROMORPHONE HCL 2 MG/ML IJ SOLN: 2.0000 mg | INTRAMUSCULAR | Status: DC | PRN

## 2015-06-15 MED ORDER — OXYCODONE HCL 5 MG PO TABS
20.0000 mg | ORAL_TABLET | ORAL | Status: DC
  Administered 2015-06-15: 20 mg via ORAL
  Filled 2015-06-15: qty 4

## 2015-06-15 NOTE — Progress Notes (Signed)
SICKLE CELL SERVICE PROGRESS NOTE  Matthew Shannon ZOX:[id number] DOB: [birth year] DOA: 06/14/2015 PCP: Jeanann Lewandowsky, MD  Assessment/Plan: Active Problems:   Tobacco abuse   Sickle cell crisis (HCC)   Chest pain   Leukocytosis   Exercise hypoxemia   Sickle cell disease with crisis (HCC)  1. Hb SS with crisis: Pt reports that his pain is down to 5/10 and requesting to be dischagred. Additionally he disconnected his IV and telemetry so that he can take a shower without notifying the nurses and as all the data from the PCA was lost. However in calculating the remainder of the syringe and appears that the patient utilized 16.29 mg of Dilaudid in the last 12 hours. In light of the fact the patient does not have any IV access at present I will transition him to oral oxycodone and provide clinician assisted doses by the subcutaneous route. This will allow an opportunity to evaluate his control with oral analgesics while awaiting replacement of peripheral IV access. Change Toradol to ibuprofen to begin by oral route and encourage increase oral fluid intake. 2. Hypoxemia: The patient had exertional hypoxemia which could be a reflection of developing pulmonary hypertension. I data the patient has pneumonia as he has no clinical sequela for pneumonia. I will order a 2-D echocardiogram to evaluate for pulmonary hypertension. The patient is also a smoker which is increases his risk for pulmonary hypertension in the setting of hemoglobin SS disease. 3. Leukocytosis: Patient has a mild leukocytosis without a left shift. I suspect that this is secondary to his state of crisis and this represents a reactive leukocytosis. We'll continue to monitor the white blood cell count. 4. Anemia of chronic disease: The patient is just about at his baseline. We'll continue hydroxyurea and monitor hemoglobin levels. 5. Chronic pain: Patient takes MS Contin and this is been restarted this morning. 6. Medical Non-compliance: The  patient is to present to be discharged home despite a significant hypoxemia and without oxygen being arranged. I have advised patient that we need to further evaluate the hypoxemia and make arrangements for oxygen at home if necessary. Thus his medical risk at this point would make it unsafe for discharge home despite the fact that his pain is controlled. It is unclear to me whether or not the patient is willing to remain in the hospital or whether he will leave AGAINST MEDICAL ADVICE.  Code Status: Full Code Family Communication: N/A Disposition Plan: Not yet ready for discharge  MATTHEWS,[name]  Pager [phone]. If 7PM-7AM, please contact night-coverage.  06/15/2015, 2:12 PM  LOS: 1 day   Interim History: Patient states that his pain is at 5 out of 10 this morning. He reports that he feels well and feels that he can go home.  Consultants:  None  Procedures:  None  Antibiotics:  None   Objective: Filed Vitals:   06/15/15 0222 06/15/15 0427 06/15/15 0632 06/15/15 0756  BP: 96/51  96/50   Pulse: 71  63   Temp: 97.8 F (36.6 C)  97.8 F (36.6 C)   TempSrc: Oral  Oral   Resp: Height:      Weight:      SpO2: 95% 98% 98% 99%   Weight change:   Intake/Output Summary (Last 24 hours) at 06/15/15 1412 Last data filed at 06/15/15 0700  Gross per 24 hour  Intake 1296.67 ml  Output      0 ml  Net 1296.67 ml    General:  Alert, awake, oriented x3, in no Apparent distress.  HEENT: [place]/AT PEERL, EOMI: Mild icterus Neck: Trachea midline,  no masses, no thyromegal,y no JVD, no carotid bruit OROPHARYNX:  Moist, No exudate/ erythema/lesions.  Heart: Regular rate and rhythm, without murmurs, rubs, gallops, PMI non-displaced, no heaves or thrills on palpation.  Lungs: Clear to auscultation, no wheezing or rhonchi noted. No increased vocal fremitus resonant to percussion. Abdomen: Soft, nontender, nondistended, positive bowel sounds, no masses no hepatosplenomegaly  noted.  Neuro: No focal neurological deficits noted cranial nerves II through XII grossly intact.  Strength at functional baseline in bilateral upper and lower extremities. Musculoskeletal: No warm swelling or erythema around joints, no spinal tenderness noted. Psychiatric: Patient alert and oriented x3, good insight and cognition, good recent to remote recall.    Data Reviewed: Basic Metabolic Panel:  Recent Labs Lab 06/10/15 1431 06/14/15 1230 06/15/15 0453  NA 141 143 139  K 4.1 4.5 3.6  CL 107 110 108  CO2 24 25 23   GLUCOSE 88 84 88  BUN 6 7 6   CREATININE 0.44* 0.53* 0.47*  CALCIUM 9.5 9.7 8.9  MG  --   --  1.6*  PHOS  --   --  4.1   Liver Function Tests:  Recent Labs Lab 06/10/15 1431 06/14/15 1230 06/15/15 0453  AST 26 32 18  ALT 16* 10* 8*  ALKPHOS 83 84 72  BILITOT 2.9* 4.7* 4.3*  PROT 8.0 8.2* 6.7  ALBUMIN 4.6 4.9 3.9   No results for input(s): LIPASE, AMYLASE in the last 168 hours. No results for input(s): AMMONIA in the last 168 hours. CBC:  Recent Labs Lab 06/10/15 1431 06/14/15 1230 06/15/15 0453  WBC 16.5* 16.6* 15.6*  NEUTROABS 11.7* 11.3* 9.7*  HGB 8.5* 9.3* 7.7*  HCT 23.8* 25.7* 21.5*  MCV 93.0 92.4 93.1  PLT 356 316 262   Cardiac Enzymes: No results for input(s): CKTOTAL, CKMB, CKMBINDEX, TROPONINI in the last 168 hours. BNP (last 3 results) No results for input(s): BNP in the last 8760 hours.  ProBNP (last 3 results) No results for input(s): PROBNP in the last 8760 hours.  CBG: No results for input(s): GLUCAP in the last 168 hours.  No results found for this or any previous visit (from the past 240 hour(s)).   Studies: Dg Chest 2 View  06/14/2015  CLINICAL DATA:  Sickle cell pain crisis. EXAM: CHEST  2 VIEW COMPARISON:  06/08/2015 FINDINGS: Mild vascular congestion. Patchy airspace opacity in the left mid lung and right base could reflect atelectasis or infiltrates. Heart is normal size. No visible effusions or acute bony  abnormality. IMPRESSION: Mild vascular congestion. Patchy left perihilar and right infrahilar airspace opacities. Electronically Signed   By: Charlett NoseKevin  Dover M.D.   On: 06/14/2015 12:54   Dg Chest 2 View  06/08/2015  CLINICAL DATA:  25 year old male with a history of shortness of breath. EXAM: CHEST - 2 VIEW COMPARISON:  05/10/2015 FINDINGS: Cardiomediastinal silhouette projects within normal limits in size and contour. Nonspecific linear opacities in the hilar region bilaterally, similar pattern to prior. No confluent airspace disease or pneumothorax. No pleural effusion. No displaced fracture.  Scoliotic curvature again noted. Unremarkable appearance of the upper abdomen. IMPRESSION: Similar appearance of chronic interstitial opacities, which are nonspecific and may reflect inflammation such as acute or acute on chronic bronchitis/bronchiolitis given the history of smoking. No evidence of lobar pneumonia. Signed, Yvone NeuJaime S. Loreta AveWagner, DO [facility] Electronically Signed   By: Marijean NiemannJaime  Loreta Ave D.O.   On: 06/08/2015 11:20    Scheduled Meds: . enoxaparin (LOVENOX) injection  40 mg Subcutaneous Q24H  . folic acid  1 mg Oral Daily  . hydroxyurea  1,000 mg Oral Daily  . ibuprofen  600 mg Oral Q6H  . morphine  30 mg Oral Q12H  . oxyCODONE  20 mg Oral Q4H  . senna-docusate  1 tablet Oral BID   Continuous Infusions: . dextrose 5 % and 0.45% NaCl 125 mL/hr at 06/14/15 1222    Time spent 35 minutes

## 2015-06-15 NOTE — Progress Notes (Signed)
Pt not in room belongs gone. MD notified.

## 2015-06-15 NOTE — Discharge Summary (Signed)
Matthew Shannon MRN: [id number] DOB/AGE: [birth year] 25 y.o.  Admit date: 06/14/2015 Discharge date: 06/15/2015  Primary Care Physician:  Jeanann Lewandowsky, MD   Discharge Diagnoses:   Patient Active Problem List   Diagnosis Date Noted  . Exercise hypoxemia 06/14/2015  . Sickle cell disease with crisis (HCC) 06/14/2015  . Chronic pain 05/21/2015  . Microcytic anemia   . EKG abnormalities   . Eczema   . Sickle-cell disease with pain (HCC) 04/19/2015  . Anemia of chronic disease   . Leukocytosis 01/24/2015  . Sickle cell anemia with pain (HCC) 01/09/2015  . Dental infection 11/17/2014  . Chest pain 11/17/2014  . Anemia 10/09/2014  . Sickle cell crisis (HCC) 10/09/2014  . Tachycardia 09/26/2014  . Sickle cell pain crisis (HCC) 09/24/2014  . Community acquired pneumonia 09/24/2014  . Tobacco abuse 09/24/2014  . CAP (community acquired pneumonia) 09/24/2014    DISCHARGE MEDICATION:   Medication List    TAKE these medications        folic acid 1 MG tablet  Commonly known as:  FOLVITE  Take 1 tablet (1 mg total) by mouth daily.     hydroxyurea 500 MG capsule  Commonly known as:  HYDREA  Take 2 capsules (1,000 mg total) by mouth daily. May take with food to minimize GI side effects.     ibuprofen 600 MG tablet  Commonly known as:  ADVIL,MOTRIN  Take 1 tablet (600 mg total) by mouth every 8 (eight) hours as needed for moderate pain.     morphine 30 MG 12 hr tablet  Commonly known as:  MS CONTIN  Take 30 mg by mouth every 12 (twelve) hours.     Oxycodone HCl 20 MG Tabs  Take 1 tablet (20 mg total) by mouth every 6 (six) hours as needed.          Consults:     SIGNIFICANT DIAGNOSTIC STUDIES:  Dg Chest 2 View  06/14/2015  CLINICAL DATA:  Sickle cell pain crisis. EXAM: CHEST  2 VIEW COMPARISON:  06/08/2015 FINDINGS: Mild vascular congestion. Patchy airspace opacity in the left mid lung and right base could reflect atelectasis or infiltrates. Heart is normal size. No  visible effusions or acute bony abnormality. IMPRESSION: Mild vascular congestion. Patchy left perihilar and right infrahilar airspace opacities. Electronically Signed   By: Charlett Nose M.D.   On: 06/14/2015 12:54   Dg Chest 2 View  06/08/2015  CLINICAL DATA:  25 year old male with a history of shortness of breath. EXAM: CHEST - 2 VIEW COMPARISON:  05/10/2015 FINDINGS: Cardiomediastinal silhouette projects within normal limits in size and contour. Nonspecific linear opacities in the hilar region bilaterally, similar pattern to prior. No confluent airspace disease or pneumothorax. No pleural effusion. No displaced fracture.  Scoliotic curvature again noted. Unremarkable appearance of the upper abdomen. IMPRESSION: Similar appearance of chronic interstitial opacities, which are nonspecific and may reflect inflammation such as acute or acute on chronic bronchitis/bronchiolitis given the history of smoking. No evidence of lobar pneumonia. Signed, Yvone Neu. Loreta Ave, DO [facility] Electronically Signed   By: Gilmer Mor D.O.   On: 06/08/2015 11:20        No results found for this or any previous visit (from the past 240 hour(s)).  BRIEF ADMITTING H & P: Presented with back and chest pain similar to his prior sickle cell pain crisis lasting for the past 2 days. Pain is typical in nature similar to prior sickle cell pain. This was  associated and mild shortness of breath states patient has been out of his regular oxycodone pills. He was given oxycodone 20 mg 120 tabs on April 11 to take q 6 hours. He reports taking them every 3 hours and doubling the dose. He run out by the end of April. Since then he has been been coming to ER on regular bases. He states that he is no longer prescribed MS Contin despite reporting that in the past Patient states again that he is out of his medications states his pain was never well controlled. Patient has known care  plan in emergency department. Patient had not had any fevers no cough no nausea no vomiting no urinary complaints he have had mildly loose stools. Reports he has neck appointment with his PCP in about 1 week Patient have had recurrent visits to emergency department on numerous occasions complaining of severe chest pain he was seen on April 26, 29 and 30th as well as May 2 and now the 5th of May   Hospital Course:  Present on Admission:  . Sickle cell crisis (HCC) : Pt was admitted with sickle cell crisis. From and review of the [facility], it appears that he has not had his MS Contin since 4/22 as it was last filled for a months supply on 3/22. Nevertheless he presented with c/o pain in back and chest wall. He has a care plan devised with his PMD and apparently failed the plan in the ED thus was admitted. He was placed on a PCA and used 16.29 mg in the last 12 hours. He subsequently disconnected the PCA and Telemetry to take a shower and thus all data from the PCA pump was lost as was his IV acces. He reports today that his pain is at 5/10 which is his baseline of pain at home and is requesting release home. I transitioned patient to oral analgesics until IV access could be restored and also with the intent of observing patient's pain control on oral analgesics. Patient requested discharge home immediately. I explained to the patient that he was experiencing an ambulatory hypoxemia and that this required further evaluation before safe discharge could be affected. There was some question of atelectasis vs. Pneumonia with regard to findings on CXR. This is felt to be atelectasis as patient has no clinical sequela of pneumonia. He has no fevers, no hypoxemia cough or SOB. Furthermore he was observed without antibiotics and had no derangement in clinical condition except for the persistence of ambulatory hypoxemia. A 2-D echocardiogram was ordered to evaluate for pulmonary hypertension however the patient apparently  left AGAINST MEDICAL ADVICE without notifying the nursing staff. I received a call from the nurses saying that the patient had eloped from his room and that all of his belongings were gone.    Disposition and Follow-up:  Pt left AGAINST MEDICAL ADVICE  DISCHARGE EXAM:   No examination performed at the time of discharge the patient left AGAINST MEDICAL ADVICE. Please refer to the physical examination noted earlier in the day.   Recent Labs  06/14/15 1230 06/15/15 0453  NA 143 139  K 4.5 3.6  CL 110 108  CO2 25 23  GLUCOSE 84 88  BUN 7 6  CREATININE 0.53* 0.47*  CALCIUM 9.7 8.9  MG  --  1.6*  PHOS  --  4.1    Recent Labs  06/14/15 1230 06/15/15 0453  AST 32 18  ALT 10* 8*  ALKPHOS 84 72  BILITOT 4.7* 4.3*  PROT 8.2* 6.7  ALBUMIN 4.9 3.9   No results for input(s): LIPASE, AMYLASE in the last 72 hours.  Recent Labs  06/14/15 1230 06/15/15 0453  WBC 16.6* 15.6*  NEUTROABS 11.3* 9.7*  HGB 9.3* 7.7*  HCT 25.7* 21.5*  MCV 92.4 93.1  PLT 316 262     Total time spent including face to face and decision making was less than 30 minutes  Signed: Nataliyah Packham A. 06/15/2015, 11:14 AM

## 2015-06-15 NOTE — Progress Notes (Signed)
Refused IV restart by IV team.

## 2015-06-15 NOTE — Progress Notes (Signed)
SATURATION QUALIFICATIONS: (This note is used to comply with regulatory documentation for home oxygen)  Patient Saturations on Room Air at Rest = 92%  Patient Saturations on Room Air while Ambulating = 80%  Patient Saturations on 2 Liters of oxygen while Ambulating = 95%  Please briefly explain why patient needs home oxygen: 

## 2015-06-17 ENCOUNTER — Telehealth: Payer: Self-pay

## 2015-06-17 DIAGNOSIS — D5700 Hb-SS disease with crisis, unspecified: Secondary | ICD-10-CM

## 2015-06-17 MED ORDER — HYDROXYUREA 500 MG PO CAPS: 1000.0000 mg | ORAL_CAPSULE | Freq: Every day | ORAL | Status: DC

## 2015-06-17 MED ORDER — FOLIC ACID 1 MG PO TABS: 1.0000 mg | ORAL_TABLET | Freq: Every day | ORAL | Status: DC

## 2015-06-17 NOTE — Telephone Encounter (Signed)
Refill request for oxycodone 20mg . LOV 05/21/2015. Please advise. Thanks!

## 2015-06-17 NOTE — Telephone Encounter (Signed)
Pt is requesting a medication refill for the following medications: Oxycodone, 20mg , Hydrea, and folic Acid. Thanks!

## 2015-06-18 ENCOUNTER — Ambulatory Visit (INDEPENDENT_AMBULATORY_CARE_PROVIDER_SITE_OTHER): Payer: Medicaid - Out of State | Admitting: Internal Medicine

## 2015-06-18 ENCOUNTER — Encounter: Payer: Self-pay | Admitting: Internal Medicine

## 2015-06-18 VITALS — BP 98/45 | HR 84 | Temp 98.1°F | Resp 18 | Ht 70.0 in | Wt 125.0 lb

## 2015-06-18 DIAGNOSIS — J189 Pneumonia, unspecified organism: Secondary | ICD-10-CM

## 2015-06-18 DIAGNOSIS — G8929 Other chronic pain: Secondary | ICD-10-CM

## 2015-06-18 DIAGNOSIS — D5700 Hb-SS disease with crisis, unspecified: Secondary | ICD-10-CM | POA: Diagnosis not present

## 2015-06-18 MED ORDER — GUAIFENESIN-DM 100-10 MG/5ML PO SYRP: 5.0000 mL | ORAL_SOLUTION | ORAL | Status: AC | PRN

## 2015-06-18 MED ORDER — FOLIC ACID 1 MG PO TABS: 1.0000 mg | ORAL_TABLET | Freq: Every day | ORAL | Status: DC

## 2015-06-18 MED ORDER — AMOXICILLIN-POT CLAVULANATE 875-125 MG PO TABS: 1.0000 | ORAL_TABLET | Freq: Two times a day (BID) | ORAL | Status: AC

## 2015-06-18 MED ORDER — MORPHINE SULFATE ER 30 MG PO TBCR: 30.0000 mg | EXTENDED_RELEASE_TABLET | Freq: Two times a day (BID) | ORAL | Status: DC

## 2015-06-18 MED ORDER — OXYCODONE HCL 20 MG PO TABS: 20.0000 mg | ORAL_TABLET | Freq: Four times a day (QID) | ORAL | Status: DC | PRN

## 2015-06-18 NOTE — Patient Instructions (Signed)
Community-Acquired Pneumonia, Adult Pneumonia is an infection of the lungs. There are different types of pneumonia. One type can develop while a person is in a hospital. A different type, called community-acquired pneumonia, develops in people who are not, or have not recently been, in the hospital or other health care facility.  CAUSES Pneumonia may be caused by bacteria, viruses, or funguses. Community-acquired pneumonia is often caused by Streptococcus pneumonia bacteria. These bacteria are often passed from one person to another by breathing in droplets from the cough or sneeze of an infected person. RISK FACTORS The condition is more likely to develop in:  People who havechronic diseases, such as chronic obstructive pulmonary disease (COPD), asthma, congestive heart failure, cystic fibrosis, diabetes, or kidney disease.  People who haveearly-stage or late-stage HIV.  People who havesickle cell disease.  People who havehad their spleen removed (splenectomy).  People who havepoor Human resources officer.  People who havemedical conditions that increase the risk of breathing in (aspirating) secretions their own mouth and nose.   People who havea weakened immune system (immunocompromised).  People who smoke.  People whotravel to areas where pneumonia-causing germs commonly exist.  People whoare around animal habitats or animals that have pneumonia-causing germs, including birds, bats, rabbits, cats, and farm animals. SYMPTOMS Symptoms of this condition include:  Adry cough.  A wet (productive) cough.  Fever.  Sweating.  Chest pain, especially when breathing deeply or coughing.  Rapid breathing or difficulty breathing.  Shortness of breath.  Shaking chills.  Fatigue.  Muscle aches. DIAGNOSIS Your health care provider will take a medical history and perform a physical exam. You may also have other tests, including:  Imaging studies of your chest, including  X-rays.  Tests to check your blood oxygen level and other blood gases.  Other tests on blood, mucus (sputum), fluid around your lungs (pleural fluid), and urine. If your pneumonia is severe, other tests may be done to identify the specific cause of your illness. TREATMENT The type of treatment that you receive depends on many factors, such as the cause of your pneumonia, the medicines you take, and other medical conditions that you have. For most adults, treatment and recovery from pneumonia may occur at home. In some cases, treatment must happen in a hospital. Treatment may include:  Antibiotic medicines, if the pneumonia was caused by bacteria.  Antiviral medicines, if the pneumonia was caused by a virus.  Medicines that are given by mouth or through an IV tube.  Oxygen.  Respiratory therapy. Although rare, treating severe pneumonia may include:  Mechanical ventilation. This is done if you are not breathing well on your own and you cannot maintain a safe blood oxygen level.  Thoracentesis. This procedureremoves fluid around one lung or both lungs to help you breathe better. HOME CARE INSTRUCTIONS  Take over-the-counter and prescription medicines only as told by your health care provider.  Only takecough medicine if you are losing sleep. Understand that cough medicine can prevent your body's natural ability to remove mucus from your lungs.  If you were prescribed an antibiotic medicine, take it as told by your health care provider. Do not stop taking the antibiotic even if you start to feel better.  Sleep in a semi-upright position at night. Try sleeping in a reclining chair, or place a few pillows under your head.  Do not use tobacco products, including cigarettes, chewing tobacco, and e-cigarettes. If you need help quitting, ask your health care provider.  Drink enough water to keep your urine  clear or pale yellow. This will help to thin out mucus secretions in your  lungs. PREVENTION There are ways that you can decrease your risk of developing community-acquired pneumonia. Consider getting a pneumococcal vaccine if:  You are older than 27 years of age.  You are older than 27 years of age and are undergoing cancer treatment, have chronic lung disease, or have other medical conditions that affect your immune system. Ask your health care provider if this applies to you. There are different types and schedules of pneumococcal vaccines. Ask your health care provider which vaccination option is best for you. You may also prevent community-acquired pneumonia if you take these actions:  Get an influenza vaccine every year. Ask your health care provider which type of influenza vaccine is best for you.  Go to the dentist on a regular basis.  Wash your hands often. Use hand sanitizer if soap and water are not available. SEEK MEDICAL CARE IF:  You have a fever.  You are losing sleep because you cannot control your cough with cough medicine. SEEK IMMEDIATE MEDICAL CARE IF:  You have worsening shortness of breath.  You have increased chest pain.  Your sickness becomes worse, especially if you are an older adult or have a weakened immune system.  You cough up blood.   This information is not intended to replace advice given to you by your health care provider. Make sure you discuss any questions you have with your health care provider.   Document Released: 01/26/2005 Document Revised: 10/17/2014 Document Reviewed: 05/23/2014 Elsevier Interactive Patient Education 2016 Elsevier Inc. Sickle Cell Anemia, Adult Sickle cell anemia is a condition in which red blood cells have an abnormal "sickle" shape. This abnormal shape shortens the cells' life span, which results in a lower than normal concentration of red blood cells in the blood. The sickle shape also causes the cells to clump together and block free blood flow through the blood vessels. As a result, the  tissues and organs of the body do not receive enough oxygen. Sickle cell anemia causes organ damage and pain and increases the risk of infection. CAUSES  Sickle cell anemia is a genetic disorder. Those who receive two copies of the gene have the condition, and those who receive one copy have the trait. RISK FACTORS The sickle cell gene is most common in people whose families originated in [place]. Other areas of the globe where sickle cell trait occurs include the [place], [place], the [place], and the [place].  SIGNS AND SYMPTOMS  Pain, especially in the extremities, back, chest, or abdomen (common). The pain may start suddenly or may develop following an illness, especially if there is dehydration. Pain can also occur due to overexertion or exposure to extreme temperature changes.  Frequent severe bacterial infections, especially certain types of pneumonia and meningitis.  Pain and swelling in the hands and feet.  Decreased activity.   Loss of appetite.   Change in behavior.  Headaches.  Seizures.  Shortness of breath or difficulty breathing.  Vision changes.  Skin ulcers. Those with the trait may not have symptoms or they may have mild symptoms.  DIAGNOSIS  Sickle cell anemia is diagnosed with blood tests that demonstrate the genetic trait. It is often diagnosed during the newborn period, due to mandatory testing nationwide. A variety of blood tests, X-rays, CT scans, MRI scans, ultrasounds, and lung function tests may also be done to monitor the condition. TREATMENT  Sickle cell anemia may  be treated with:  Medicines. You may be given pain medicines, antibiotic medicines (to treat and prevent infections) or medicines to increase the production of certain types of hemoglobin.  Fluids.  Oxygen.  Blood transfusions. HOME CARE INSTRUCTIONS   Drink enough fluid to keep your urine clear or pale yellow. Increase your fluid intake in hot weather  and during exercise.  Do not smoke. Smoking lowers oxygen levels in the blood.   Only take over-the-counter or prescription medicines for pain, fever, or discomfort as directed by your health care provider.  Take antibiotics as directed by your health care provider. Make sure you finish them it even if you start to feel better.   Take supplements as directed by your health care provider.   Consider wearing a medical alert bracelet. This tells anyone caring for you in an emergency of your condition.   When traveling, keep your medical information, health care provider's names, and the medicines you take with you at all times.   If you develop a fever, do not take medicines to reduce the fever right away. This could cover up a problem that is developing. Notify your health care provider.  Keep all follow-up appointments with your health care provider. Sickle cell anemia requires regular medical care. SEEK MEDICAL CARE IF: You have a fever. SEEK IMMEDIATE MEDICAL CARE IF:   You feel dizzy or faint.   You have new abdominal pain, especially on the left side near the stomach area.   You develop a persistent, often uncomfortable and painful penile erection (priapism). If this is not treated immediately it will lead to impotence.   You have numbness your arms or legs or you have a hard time moving them.   You have a hard time with speech.   You have a fever or persistent symptoms for more than 2-3 days.   You have a fever and your symptoms suddenly get worse.   You have signs or symptoms of infection. These include:   Chills.   Abnormal tiredness (lethargy).   Irritability.   Poor eating.   Vomiting.   You develop pain that is not helped with medicine.   You develop shortness of breath.  You have pain in your chest.   You are coughing up pus-like or bloody sputum.   You develop a stiff neck.  Your feet or hands swell or have pain.  Your  abdomen appears bloated.  You develop joint pain. MAKE SURE YOU:  Understand these instructions.   This information is not intended to replace advice given to you by your health care provider. Make sure you discuss any questions you have with your health care provider.   Document Released: 05/06/2005 Document Revised: 02/16/2014 Document Reviewed: 09/07/2012 Elsevier Interactive Patient Education [employer]2016 Elsevier Inc.

## 2015-06-18 NOTE — Progress Notes (Signed)
Patient is here for FU  Patient complains of back pain scaled currently at a 7.  Patient has taken medication this morning. Patient has not eaten.

## 2015-06-18 NOTE — Progress Notes (Signed)
Patient ID: Matthew Shannon, male   DOB: 1990/06/07, 25 y.o.   MRN: [id number]   Matthew Shannon, is a 25 y.o. male  [id number]  [id number]  DOB - [birth year]  Chief Complaint  Patient presents with  . Follow-up        Subjective:   Matthew Shannon is a 25 y.o. male with history of sickle cell anemia here today for a hospital discharge follow up visit. Patient was recently admitted to the hospital on 06/14/2015 but he left AMA on 06/15/2015 because he was not getting what he requested for pain. Patient is known to utilize ED very frequently and has missed many outpatient appointments. He only filled half of his last prescription for his regular pain medication because of financial reasons and so he ran out weekly and will go to the ED for IV pain medication. Patient is complaining of cough and occasional fever since leaving the hospital. Check status during the hospital showed patchy left perihilar and right infrahilar airspace opacities, patient claimed he was not treated because he left the hospital before treatment was started. Continue to smoke cigarettes heavily despite repeated counseling. Patient has No headache, No chest pain, No abdominal pain - No Nausea, No new weakness tingling or numbness.  No problems updated.  ALLERGIES: No Known Allergies  PAST MEDICAL HISTORY: Past Medical History  Diagnosis Date  . Sickle cell anemia (HCC)     MEDICATIONS AT HOME: Prior to Admission medications   Medication Sig Start Date End Date Taking? Authorizing Provider  folic acid (FOLVITE) 1 MG tablet Take 1 tablet (1 mg total) by mouth daily. 06/18/15  Yes [name], MD  hydroxyurea (HYDREA) 500 MG capsule Take 2 capsules (1,000 mg total) by mouth daily. May take with food to minimize GI side effects. 06/17/15  Yes [name], MD  ibuprofen (ADVIL,MOTRIN) 600 MG tablet Take 1 tablet (600 mg total) by mouth every 8 (eight) hours as needed for moderate pain. 05/21/15  Yes Tamisha Nordstrom Annitta Needs, MD  morphine (MS CONTIN) 30 MG 12 hr tablet Take 1 tablet (30 mg total) by mouth every 12 (twelve) hours. 06/18/15  Yes [name], MD  Oxycodone HCl 20 MG TABS Take 1 tablet (20 mg total) by mouth every 6 (six) hours as needed. 06/18/15  Yes [name], MD  amoxicillin-clavulanate (AUGMENTIN) 875-125 MG tablet Take 1 tablet by mouth 2 (two) times daily. 06/18/15 06/25/15  [name], MD  guaiFENesin-dextromethorphan (ROBITUSSIN DM) 100-10 MG/5ML syrup Take 5 mLs by mouth every 4 (four) hours as needed for cough. 06/18/15 06/25/15  [name], MD     Objective:   Filed Vitals:   06/18/15 0927  BP: 98/45  Pulse: 84  Temp: 98.1 F (36.7 C)  TempSrc: Oral  Resp: 18  Height:  (1.778 m)  Weight: 125 lb (56.7 kg)  SpO2: 100%    Exam General appearance : Awake, alert, not in any distress. Speech Clear. Not toxic looking HEENT: Atraumatic and Normocephalic, pupils equally reactive to light and accomodation Neck: supple, no JVD. No cervical lymphadenopathy.  Chest: Good air entry bilaterally, ? Few Crackles right middle lobe posteriorly  CVS: S1 S2 regular, no murmurs.  Abdomen: Bowel sounds present, Non tender and not distended with no gaurding, rigidity or rebound. Extremities: B/L Lower Ext shows no edema, both legs are warm to touch Neurology: Awake alert, and oriented X 3, CN II-XII intact, Non focal Skin:No Rash  Data Review No results  found for: HGBA1C   Assessment & Plan   1. Sickle cell anemia with pain (HCC) Refill - morphine (MS CONTIN) 30 MG 12 hr tablet; Take 1 tablet (30 mg total) by mouth every 12 (twelve) hours.  Dispense: 60 tablet; Refill: 0 - Oxycodone HCl 20 MG TABS; Take 1 tablet (20 mg total) by mouth every 6 (six) hours as needed.  Dispense: 120 tablet; Refill: 0 - folic acid (FOLVITE) 1 MG tablet; Take 1 tablet (1 mg total) by mouth daily.  Dispense: 90 tablet; Refill: 11  Continue Hydrea. We discussed the need for  good hydration, monitoring of hydration status, avoidance of heat, cold, stress, and infection triggers. We discussed the risks and benefits of Hydrea, including bone marrow suppression, the possibility of GI upset, skin ulcers, hair thinning, and teratogenicity. The patient was reminded of the need to seek medical attention of any symptoms of bleeding, anemia, or infection. Continue folic acid 1 mg daily to prevent aplastic bone marrow crises.   2. Chronic pain  - morphine (MS CONTIN) 30 MG 12 hr tablet; Take 1 tablet (30 mg total) by mouth every 12 (twelve) hours.  Dispense: 60 tablet; Refill: 0 - Oxycodone HCl 20 MG TABS; Take 1 tablet (20 mg total) by mouth every 6 (six) hours as needed.  Dispense: 120 tablet; Refill: 0  We agreed on Opiate dose and amount of pills  per month. We discussed that pt is to receive Schedule II prescriptions only from our clinic. Pt is also aware that the prescription history is available to us online through the [facility]. Controlled substance agreement reviewed and signed. We reminded Matthew Shannon that all patients receiving Schedule II narcotics must be seen for follow within one month of prescription being requested. We reviewed the terms of our pain agreement, including the need to keep medicines in a safe locked location away from children or pets, and the need to report excess sedation or constipation, measures to avoid constipation, and policies related to early refills and stolen prescriptions. According to the [place] [facility] program, we have reviewed details related to analgesia, adverse effects and aberrant behaviors.  3. CAP (community acquired pneumonia): 06/14/2015 CXR showed: Mild vascular congestion. Patchy left perihilar and right infrahilar airspace opacities.   Will treat empirically as patient continues to have cough and occasional fever  - amoxicillin-clavulanate (AUGMENTIN) 875-125 MG tablet; Take 1 tablet by mouth 2 (two) times daily.  Dispense:  14 tablet; Refill: 0 - guaiFENesin-dextromethorphan (ROBITUSSIN DM) 100-10 MG/5ML syrup; Take 5 mLs by mouth every 4 (four) hours as needed for cough.  Dispense: 480 mL; Refill: 0  Patient have been counseled extensively about nutrition and exercise  Return in about 4 weeks (around 07/16/2015) for Sickle Cell Disease/Pain.  The patient was given clear instructions to go to ER or return to medical center if symptoms don't improve, worsen or new problems develop. The patient verbalized understanding. The patient was told to call to get lab results if they haven't heard anything in the next week.   This note has been created with Education officer, environmentalDragon speech recognition software and smart phrase technology. Any transcriptional errors are unintentional.    Jeanann LewandowskyJEGEDE, Kayline Sheer, MD, MHA, Maxwell CaulFACP, FAAP, CPE [facility] [place], [place] [phone]   06/18/2015, 10:11 AM

## 2015-06-24 ENCOUNTER — Emergency Department (HOSPITAL_COMMUNITY): Payer: Medicaid - Out of State

## 2015-06-24 ENCOUNTER — Encounter (HOSPITAL_COMMUNITY): Payer: Self-pay | Admitting: Emergency Medicine

## 2015-06-24 ENCOUNTER — Emergency Department (HOSPITAL_COMMUNITY)
Admission: EM | Admit: 2015-06-24 | Discharge: 2015-06-24 | Disposition: A | Discharge status: Home / Self Care | Payer: Medicaid - Out of State | Attending: Emergency Medicine | Admitting: Emergency Medicine

## 2015-06-24 DIAGNOSIS — Z79899 Other long term (current) drug therapy: Secondary | ICD-10-CM | POA: Diagnosis not present

## 2015-06-24 DIAGNOSIS — Z792 Long term (current) use of antibiotics: Secondary | ICD-10-CM | POA: Insufficient documentation

## 2015-06-24 DIAGNOSIS — D5700 Hb-SS disease with crisis, unspecified: Secondary | ICD-10-CM | POA: Diagnosis not present

## 2015-06-24 DIAGNOSIS — Z79891 Long term (current) use of opiate analgesic: Secondary | ICD-10-CM | POA: Insufficient documentation

## 2015-06-24 DIAGNOSIS — F17210 Nicotine dependence, cigarettes, uncomplicated: Secondary | ICD-10-CM | POA: Diagnosis not present

## 2015-06-24 DIAGNOSIS — Z791 Long term (current) use of non-steroidal anti-inflammatories (NSAID): Secondary | ICD-10-CM | POA: Insufficient documentation

## 2015-06-24 LAB — COMPREHENSIVE METABOLIC PANEL
ALBUMIN: 4.7 g/dL (ref 3.5–5.0)
ALK PHOS: 74 U/L (ref 38–126)
ALT: 12 U/L — AB (ref 17–63)
ANION GAP: 6 (ref 5–15)
AST: 27 U/L (ref 15–41)
BILIRUBIN TOTAL: 5.5 mg/dL — AB (ref 0.3–1.2)
BUN: 5 mg/dL — AB (ref 6–20)
CALCIUM: 9.4 mg/dL (ref 8.9–10.3)
CO2: 27 mmol/L (ref 22–32)
CREATININE: 0.39 mg/dL — AB (ref 0.61–1.24)
Chloride: 105 mmol/L (ref 101–111)
GFR calc Af Amer: >60 mL/min (ref >60)
GFR calc non Af Amer: >60 mL/min (ref >60)
GLUCOSE: 69 mg/dL (ref 65–99)
Potassium: 3.9 mmol/L (ref 3.5–5.1)
Sodium: 138 mmol/L (ref 135–145)
TOTAL PROTEIN: 8.2 g/dL — AB (ref 6.5–8.1)

## 2015-06-24 LAB — CBC WITH DIFFERENTIAL/PLATELET
BASOS ABS: 0.1 10*3/uL (ref 0.0–0.1)
Basophils Relative: 0 %
EOS ABS: 0.6 10*3/uL (ref 0.0–0.7)
EOS PCT: 3 %
HCT: 20.6 % — ABNORMAL LOW (ref 39.0–52.0)
Hemoglobin: 7.4 g/dL — ABNORMAL LOW (ref 13.0–17.0)
Lymphocytes Relative: 23 %
Lymphs Abs: 3.9 10*3/uL (ref 0.7–4.0)
MCH: 33.3 pg (ref 26.0–34.0)
MCHC: 35.9 g/dL (ref 30.0–36.0)
MCV: 92.8 fL (ref 78.0–100.0)
Monocytes Absolute: 1.8 10*3/uL — ABNORMAL HIGH (ref 0.1–1.0)
Monocytes Relative: 11 %
Neutro Abs: 11 10*3/uL — ABNORMAL HIGH (ref 1.7–7.7)
Neutrophils Relative %: 63 %
PLATELETS: 270 10*3/uL (ref 150–400)
RBC: 2.22 MIL/uL — AB (ref 4.22–5.81)
RDW: 19.5 % — ABNORMAL HIGH (ref 11.5–15.5)
WBC: 17.4 10*3/uL — ABNORMAL HIGH (ref 4.0–10.5)

## 2015-06-24 LAB — I-STAT TROPONIN, ED: TROPONIN I, POC: 0 ng/mL (ref 0.00–0.08)

## 2015-06-24 LAB — RETICULOCYTES
RBC.: 2.19 MIL/uL — AB (ref 4.22–5.81)
RETIC CT PCT: 19.3 % — AB (ref 0.4–3.1)
Retic Count, Absolute: 422.7 10*3/uL — ABNORMAL HIGH (ref 19.0–186.0)

## 2015-06-24 MED ORDER — HYDROMORPHONE HCL 2 MG/ML IJ SOLN
3.0000 mg | Freq: Once | INTRAMUSCULAR | Status: AC
  Administered 2015-06-24: 3 mg via INTRAVENOUS
  Filled 2015-06-24: qty 2

## 2015-06-24 MED ORDER — KETOROLAC TROMETHAMINE 30 MG/ML IJ SOLN
30.0000 mg | Freq: Once | INTRAMUSCULAR | Status: AC
  Administered 2015-06-24: 30 mg via INTRAVENOUS
  Filled 2015-06-24: qty 1

## 2015-06-24 MED ORDER — DEXTROSE-NACL 5-0.45 % IV SOLN
INTRAVENOUS | Status: DC
  Administered 2015-06-24: 12:00:00 via INTRAVENOUS

## 2015-06-24 MED ORDER — DIPHENHYDRAMINE HCL 50 MG/ML IJ SOLN
25.0000 mg | Freq: Once | INTRAMUSCULAR | Status: AC
  Administered 2015-06-24: 25 mg via INTRAVENOUS
  Filled 2015-06-24: qty 1

## 2015-06-24 NOTE — ED Notes (Signed)
Pt c/o back pain since Sunday evening. Pt states this feels like his typical sickle cell pain.

## 2015-06-24 NOTE — ED Notes (Signed)
PT request to have blood work done when starting IV

## 2015-06-24 NOTE — ED Notes (Signed)
Pt transported to DG; EKG to be completed with pt return. 
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
## 2015-06-24 NOTE — Discharge Instructions (Signed)
See your primary care doctor for refill for your pain medications.  Take your meds as prescribed.   See your doctor.   Return to ER if you have worse chest pain, back pain, trouble breathing, fevers, shortness of breath.

## 2015-06-25 ENCOUNTER — Other Ambulatory Visit: Payer: Self-pay | Admitting: Internal Medicine

## 2015-06-25 DIAGNOSIS — G8929 Other chronic pain: Secondary | ICD-10-CM

## 2015-06-25 DIAGNOSIS — D5700 Hb-SS disease with crisis, unspecified: Secondary | ICD-10-CM

## 2015-06-25 MED ORDER — OXYCODONE HCL 20 MG PO TABS: 20.0000 mg | ORAL_TABLET | Freq: Four times a day (QID) | ORAL | Status: DC | PRN

## 2015-06-25 MED ORDER — MORPHINE SULFATE ER 30 MG PO TBCR: 30.0000 mg | EXTENDED_RELEASE_TABLET | Freq: Two times a day (BID) | ORAL | Status: DC

## 2015-07-03 ENCOUNTER — Emergency Department (HOSPITAL_COMMUNITY): Payer: Medicaid - Out of State

## 2015-07-03 ENCOUNTER — Emergency Department (HOSPITAL_COMMUNITY)
Admission: EM | Admit: 2015-07-03 | Discharge: 2015-07-03 | Disposition: A | Discharge status: Home / Self Care | Payer: Medicaid - Out of State | Attending: Emergency Medicine | Admitting: Emergency Medicine

## 2015-07-03 ENCOUNTER — Encounter (HOSPITAL_COMMUNITY): Payer: Self-pay

## 2015-07-03 DIAGNOSIS — F17210 Nicotine dependence, cigarettes, uncomplicated: Secondary | ICD-10-CM | POA: Diagnosis not present

## 2015-07-03 DIAGNOSIS — Z79891 Long term (current) use of opiate analgesic: Secondary | ICD-10-CM | POA: Diagnosis not present

## 2015-07-03 DIAGNOSIS — D5700 Hb-SS disease with crisis, unspecified: Secondary | ICD-10-CM | POA: Diagnosis present

## 2015-07-03 DIAGNOSIS — Z791 Long term (current) use of non-steroidal anti-inflammatories (NSAID): Secondary | ICD-10-CM | POA: Diagnosis not present

## 2015-07-03 DIAGNOSIS — R079 Chest pain, unspecified: Secondary | ICD-10-CM

## 2015-07-03 LAB — CBC WITH DIFFERENTIAL/PLATELET
BASOS ABS: 0 10*3/uL (ref 0.0–0.1)
BASOS PCT: 0 %
Eosinophils Absolute: 0.3 10*3/uL (ref 0.0–0.7)
Eosinophils Relative: 2 %
HEMATOCRIT: 22.6 % — AB (ref 39.0–52.0)
HEMOGLOBIN: 8 g/dL — AB (ref 13.0–17.0)
LYMPHS PCT: 17 %
Lymphs Abs: 2.4 10*3/uL (ref 0.7–4.0)
MCH: 32.9 pg (ref 26.0–34.0)
MCHC: 35.4 g/dL (ref 30.0–36.0)
MCV: 93 fL (ref 78.0–100.0)
MONO ABS: 1.5 10*3/uL — AB (ref 0.1–1.0)
Monocytes Relative: 10 %
NEUTROS ABS: 9.9 10*3/uL — AB (ref 1.7–7.7)
NEUTROS PCT: 71 %
Platelets: 375 10*3/uL (ref 150–400)
RBC: 2.43 MIL/uL — AB (ref 4.22–5.81)
RDW: 18.7 % — ABNORMAL HIGH (ref 11.5–15.5)
WBC: 14.1 10*3/uL — ABNORMAL HIGH (ref 4.0–10.5)

## 2015-07-03 LAB — BASIC METABOLIC PANEL
ANION GAP: 5 (ref 5–15)
BUN: 6 mg/dL (ref 6–20)
CALCIUM: 9.4 mg/dL (ref 8.9–10.3)
CHLORIDE: 104 mmol/L (ref 101–111)
CO2: 28 mmol/L (ref 22–32)
Creatinine, Ser: 0.49 mg/dL — ABNORMAL LOW (ref 0.61–1.24)
GFR calc non Af Amer: >60 mL/min (ref >60)
GLUCOSE: 94 mg/dL (ref 65–99)
POTASSIUM: 4.1 mmol/L (ref 3.5–5.1)
Sodium: 137 mmol/L (ref 135–145)

## 2015-07-03 MED ORDER — KETOROLAC TROMETHAMINE 60 MG/2ML IM SOLN
60.0000 mg | Freq: Once | INTRAMUSCULAR | Status: AC
  Administered 2015-07-03: 60 mg via INTRAMUSCULAR
  Filled 2015-07-03: qty 2

## 2015-07-03 MED ORDER — HYDROMORPHONE HCL 2 MG/ML IJ SOLN
3.0000 mg | Freq: Once | INTRAMUSCULAR | Status: AC
  Administered 2015-07-03: 3 mg via INTRAVENOUS
  Filled 2015-07-03: qty 2

## 2015-07-03 MED ORDER — OXYCODONE-ACETAMINOPHEN 5-325 MG PO TABS
3.0000 | ORAL_TABLET | Freq: Once | ORAL | Status: AC
  Administered 2015-07-03: 3 via ORAL
  Filled 2015-07-03: qty 3

## 2015-07-03 MED ORDER — DIPHENHYDRAMINE HCL 25 MG PO CAPS
25.0000 mg | ORAL_CAPSULE | Freq: Once | ORAL | Status: AC
  Administered 2015-07-03: 25 mg via ORAL
  Filled 2015-07-03: qty 1

## 2015-07-03 MED ORDER — HYDROMORPHONE HCL 2 MG/ML IJ SOLN
2.0000 mg | Freq: Once | INTRAMUSCULAR | Status: AC
  Administered 2015-07-03: 2 mg via INTRAMUSCULAR
  Filled 2015-07-03: qty 1

## 2015-07-03 NOTE — ED Notes (Signed)
Pt presents with c/o sickle cell pain. 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 Pt reporting pain in his chest and back for the past 2 days.

## 2015-07-03 NOTE — Progress Notes (Addendum)
Pt with 37 [facility] ED visits and 6 admissions in the last 6 months Pt continues with medicaid of [place] coverage This pt has been seen and provided resources by Case management providers but is non compliant Pt last Office visit was on on 06/18/15 with [facility] On 06/14/2015 he had an OV but he left AMA on 06/15/2015 "because he was not getting what he requested for pain. Patient is known to utilize ED very frequently and has missed many outpatient appointments. He only filled half of his last prescription for his regular pain medication because of financial reasons and so he ran out weekly and will go to the ED for IV pain medication. Patient is complaining of cough and occasional fever since leaving the hospital. Check status during the hospital showed patchy left perihilar and right infrahilar airspace opacities, patient claimed he was not treated because he left the hospital before treatment was started. Continue to smoke cigarettes heavily despite repeated counseling. " [facility]  Pt [facility] which was 6 days after last [facility] OV visit    Pt has an upcoming appt - entered in d/c instructions: Julianne HandlerLachina Hollis  Go on 07/24/2015 you have a scheduled 2 month follow up appointment with  Julianne HandlerLachina Hollis on 07/24/15 at 0830 [address] [place] [place] [id number] [phone]
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
## 2015-07-06 ENCOUNTER — Emergency Department (HOSPITAL_COMMUNITY)
Admission: EM | Admit: 2015-07-06 | Discharge: 2015-07-07 | Disposition: A | Discharge status: Home / Self Care | Payer: Medicaid Other | Attending: Emergency Medicine | Admitting: Emergency Medicine

## 2015-07-06 ENCOUNTER — Encounter (HOSPITAL_COMMUNITY): Payer: Self-pay | Admitting: *Deleted

## 2015-07-06 DIAGNOSIS — D571 Sickle-cell disease without crisis: Secondary | ICD-10-CM | POA: Diagnosis not present

## 2015-07-06 DIAGNOSIS — F17210 Nicotine dependence, cigarettes, uncomplicated: Secondary | ICD-10-CM | POA: Insufficient documentation

## 2015-07-06 DIAGNOSIS — Z79891 Long term (current) use of opiate analgesic: Secondary | ICD-10-CM | POA: Diagnosis not present

## 2015-07-06 DIAGNOSIS — D5700 Hb-SS disease with crisis, unspecified: Secondary | ICD-10-CM

## 2015-07-06 LAB — RETICULOCYTES
RBC.: 2.44 MIL/uL — ABNORMAL LOW (ref 4.22–5.81)
Retic Count, Absolute: 331.8 10*3/uL — ABNORMAL HIGH (ref 19.0–186.0)
Retic Ct Pct: 13.6 % — ABNORMAL HIGH (ref 0.4–3.1)

## 2015-07-06 MED ORDER — ONDANSETRON HCL 4 MG/2ML IJ SOLN
4.0000 mg | INTRAMUSCULAR | Status: DC | PRN
  Administered 2015-07-06: 4 mg via INTRAVENOUS
  Filled 2015-07-06: qty 2

## 2015-07-06 MED ORDER — HYDROMORPHONE HCL 2 MG/ML IJ SOLN
2.5000 mg | INTRAMUSCULAR | Status: AC | PRN
  Administered 2015-07-06 – 2015-07-07 (×3): 2.5 mg via INTRAVENOUS
  Filled 2015-07-06 (×3): qty 2

## 2015-07-06 MED ORDER — KETOROLAC TROMETHAMINE 30 MG/ML IJ SOLN
30.0000 mg | INTRAMUSCULAR | Status: AC
  Administered 2015-07-06: 30 mg via INTRAVENOUS
  Filled 2015-07-06: qty 1

## 2015-07-06 MED ORDER — DEXTROSE-NACL 5-0.45 % IV SOLN
INTRAVENOUS | Status: DC
  Administered 2015-07-06: via INTRAVENOUS

## 2015-07-06 NOTE — ED Notes (Signed)
Pt wants labs drawn when IV is started.
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
## 2015-07-06 NOTE — ED Notes (Signed)
Pt c/o sickle cell pain to back and abdomen x 2 days

## 2015-07-07 ENCOUNTER — Encounter (HOSPITAL_COMMUNITY): Payer: Self-pay | Admitting: Emergency Medicine

## 2015-07-07 ENCOUNTER — Emergency Department (HOSPITAL_COMMUNITY)
Admission: EM | Admit: 2015-07-07 | Discharge: 2015-07-07 | Disposition: A | Discharge status: Home / Self Care | Payer: Medicaid Other | Source: Home / Self Care | Attending: Emergency Medicine | Admitting: Emergency Medicine

## 2015-07-07 DIAGNOSIS — D5700 Hb-SS disease with crisis, unspecified: Secondary | ICD-10-CM

## 2015-07-07 DIAGNOSIS — F17210 Nicotine dependence, cigarettes, uncomplicated: Secondary | ICD-10-CM | POA: Insufficient documentation

## 2015-07-07 DIAGNOSIS — Z79899 Other long term (current) drug therapy: Secondary | ICD-10-CM

## 2015-07-07 LAB — CBC WITH DIFFERENTIAL/PLATELET
BASOS ABS: 0 10*3/uL (ref 0.0–0.1)
Basophils Relative: 0 %
EOS PCT: 4 %
Eosinophils Absolute: 0.7 10*3/uL (ref 0.0–0.7)
HEMATOCRIT: 22.4 % — AB (ref 39.0–52.0)
HEMOGLOBIN: 8 g/dL — AB (ref 13.0–17.0)
Lymphocytes Relative: 37 %
Lymphs Abs: 6.8 10*3/uL — ABNORMAL HIGH (ref 0.7–4.0)
MCH: 32.8 pg (ref 26.0–34.0)
MCHC: 35.7 g/dL (ref 30.0–36.0)
MCV: 91.8 fL (ref 78.0–100.0)
MONO ABS: 2 10*3/uL — AB (ref 0.1–1.0)
MONOS PCT: 11 %
NEUTROS PCT: 48 %
Neutro Abs: 8.9 10*3/uL — ABNORMAL HIGH (ref 1.7–7.7)
Platelets: 354 10*3/uL (ref 150–400)
RBC: 2.44 MIL/uL — ABNORMAL LOW (ref 4.22–5.81)
RDW: 18 % — ABNORMAL HIGH (ref 11.5–15.5)
WBC: 18.4 10*3/uL — AB (ref 4.0–10.5)

## 2015-07-07 LAB — COMPREHENSIVE METABOLIC PANEL
ALBUMIN: 4.6 g/dL (ref 3.5–5.0)
ALK PHOS: 92 U/L (ref 38–126)
ALT: 11 U/L — ABNORMAL LOW (ref 17–63)
AST: 21 U/L (ref 15–41)
Anion gap: 6 (ref 5–15)
BILIRUBIN TOTAL: 3.8 mg/dL — AB (ref 0.3–1.2)
BUN: 9 mg/dL (ref 6–20)
CALCIUM: 9.5 mg/dL (ref 8.9–10.3)
CHLORIDE: 107 mmol/L (ref 101–111)
CO2: 24 mmol/L (ref 22–32)
CREATININE: 0.5 mg/dL — AB (ref 0.61–1.24)
GFR calc Af Amer: >60 mL/min (ref >60)
GFR calc non Af Amer: >60 mL/min (ref >60)
Glucose, Bld: 84 mg/dL (ref 65–99)
Potassium: 3.8 mmol/L (ref 3.5–5.1)
Sodium: 137 mmol/L (ref 135–145)
Total Protein: 7.9 g/dL (ref 6.5–8.1)

## 2015-07-07 LAB — I-STAT CHEM 8, ED
BUN: 6 mg/dL (ref 6–20)
CALCIUM ION: 1.24 mmol/L — AB (ref 1.12–1.23)
CREATININE: 0.6 mg/dL — AB (ref 0.61–1.24)
Chloride: 102 mmol/L (ref 101–111)
Glucose, Bld: 87 mg/dL (ref 65–99)
HCT: 27 % — ABNORMAL LOW (ref 39.0–52.0)
HEMOGLOBIN: 9.2 g/dL — AB (ref 13.0–17.0)
Potassium: 4.3 mmol/L (ref 3.5–5.1)
Sodium: 140 mmol/L (ref 135–145)
TCO2: 26 mmol/L (ref 0–100)

## 2015-07-07 MED ORDER — OXYCODONE HCL 5 MG PO TABS
10.0000 mg | ORAL_TABLET | ORAL | Status: AC
  Administered 2015-07-07: 10 mg via ORAL
  Filled 2015-07-07: qty 2

## 2015-07-07 MED ORDER — HYDROMORPHONE HCL 2 MG/ML IJ SOLN
4.0000 mg | Freq: Once | INTRAMUSCULAR | Status: AC
  Administered 2015-07-07: 4 mg via INTRAMUSCULAR
  Filled 2015-07-07: qty 2

## 2015-07-07 MED ORDER — HYDROMORPHONE HCL 2 MG/ML IJ SOLN
3.0000 mg | Freq: Once | INTRAMUSCULAR | Status: DC
  Filled 2015-07-07: qty 2

## 2015-07-07 MED ORDER — DIPHENHYDRAMINE HCL 25 MG PO CAPS
50.0000 mg | ORAL_CAPSULE | Freq: Once | ORAL | Status: AC
  Administered 2015-07-07: 50 mg via ORAL
  Filled 2015-07-07: qty 2

## 2015-07-07 MED ORDER — PROCHLORPERAZINE EDISYLATE 5 MG/ML IJ SOLN
10.0000 mg | INTRAMUSCULAR | Status: AC
  Administered 2015-07-07: 10 mg via INTRAVENOUS
  Filled 2015-07-07: qty 2

## 2015-07-07 MED ORDER — KETOROLAC TROMETHAMINE 30 MG/ML IJ SOLN
30.0000 mg | Freq: Once | INTRAMUSCULAR | Status: DC
  Filled 2015-07-07: qty 1

## 2015-07-07 MED ORDER — DEXTROSE-NACL 5-0.45 % IV SOLN
INTRAVENOUS | Status: DC
  Administered 2015-07-07: 13:00:00 via INTRAVENOUS

## 2015-07-07 MED ORDER — FOLIC ACID 1 MG PO TABS
1.0000 mg | ORAL_TABLET | Freq: Once | ORAL | Status: AC
  Administered 2015-07-07: 1 mg via ORAL
  Filled 2015-07-07: qty 1

## 2015-07-07 MED ORDER — KETOROLAC TROMETHAMINE 60 MG/2ML IM SOLN
60.0000 mg | Freq: Once | INTRAMUSCULAR | Status: AC
  Administered 2015-07-07: 60 mg via INTRAMUSCULAR
  Filled 2015-07-07: qty 2
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
## 2015-07-07 NOTE — ED Notes (Signed)
Pt left without vitals or receiving d/c instructions.

## 2015-07-07 NOTE — Discharge Instructions (Signed)
Sickle Cell Anemia, Adult Sickle cell anemia is a condition in which red blood cells have an abnormal "sickle" shape. This abnormal shape shortens the cells' life span, which results in a lower than normal concentration of red blood cells in the blood. The sickle shape also causes the cells to clump together and block free blood flow through the blood vessels. As a result, the tissues and organs of the body do not receive enough oxygen. Sickle cell anemia causes organ damage and pain and increases the risk of infection. CAUSES  Sickle cell anemia is a genetic disorder. Those who receive two copies of the gene have the condition, and those who receive one copy have the trait. RISK FACTORS The sickle cell gene is most common in people whose families originated in [place]. Other areas of the globe where sickle cell trait occurs include the Mediterranean, [place] and [place], the [place], and the [place].  SIGNS AND SYMPTOMS  Pain, especially in the extremities, back, chest, or abdomen (common). The pain may start suddenly or may develop following an illness, especially if there is dehydration. Pain can also occur due to overexertion or exposure to extreme temperature changes.  Frequent severe bacterial infections, especially certain types of pneumonia and meningitis.  Pain and swelling in the hands and feet.  Decreased activity.   Loss of appetite.   Change in behavior.  Headaches.  Seizures.  Shortness of breath or difficulty breathing.  Vision changes.  Skin ulcers. Those with the trait may not have symptoms or they may have mild symptoms.  DIAGNOSIS  Sickle cell anemia is diagnosed with blood tests that demonstrate the genetic trait. It is often diagnosed during the newborn period, due to mandatory testing nationwide. A variety of blood tests, X-rays, CT scans, MRI scans, ultrasounds, and lung function tests may also be done to monitor the condition. TREATMENT  Sickle  cell anemia may be treated with:  Medicines. You may be given pain medicines, antibiotic medicines (to treat and prevent infections) or medicines to increase the production of certain types of hemoglobin.  Fluids.  Oxygen.  Blood transfusions. HOME CARE INSTRUCTIONS   Drink enough fluid to keep your urine clear or pale yellow. Increase your fluid intake in hot weather and during exercise.  Do not smoke. Smoking lowers oxygen levels in the blood.   Only take over-the-counter or prescription medicines for pain, fever, or discomfort as directed by your health care provider.  Take antibiotics as directed by your health care provider. Make sure you finish them it even if you start to feel better.   Take supplements as directed by your health care provider.   Consider wearing a medical alert bracelet. This tells anyone caring for you in an emergency of your condition.   When traveling, keep your medical information, health care provider's names, and the medicines you take with you at all times.   If you develop a fever, do not take medicines to reduce the fever right away. This could cover up a problem that is developing. Notify your health care provider.  Keep all follow-up appointments with your health care provider. Sickle cell anemia requires regular medical care. SEEK MEDICAL CARE IF: You have a fever. SEEK IMMEDIATE MEDICAL CARE IF:   You feel dizzy or faint.   You have new abdominal pain, especially on the left side near the stomach area.   You develop a persistent, often uncomfortable and painful penile erection (priapism). If this is not treated immediately it   will lead to impotence.   You have numbness your arms or legs or you have a hard time moving them.   You have a hard time with speech.   You have a fever or persistent symptoms for more than 2-3 days.   You have a fever and your symptoms suddenly get worse.   You have signs or symptoms of infection.  These include:   Chills.   Abnormal tiredness (lethargy).   Irritability.   Poor eating.   Vomiting.   You develop pain that is not helped with medicine.   You develop shortness of breath.  You have pain in your chest.   You are coughing up pus-like or bloody sputum.   You develop a stiff neck.  Your feet or hands swell or have pain.  Your abdomen appears bloated.  You develop joint pain. MAKE SURE YOU:  Understand these instructions.   This information is not intended to replace advice given to you by your health care provider. Make sure you discuss any questions you have with your health care provider.   Document Released: 05/06/2005 Document Revised: 02/16/2014 Document Reviewed: 09/07/2012 Elsevier Interactive Patient Education 2016 Elsevier Inc.  

## 2015-07-07 NOTE — Discharge Instructions (Signed)
Read the information below.  You may return to the Emergency Department at any time for worsening condition or any new symptoms that concern you.  If you develop high fevers, worsening abdominal pain, uncontrolled vomiting, or are unable to tolerate fluids by mouth, return to the ER for a recheck.   °

## 2015-07-07 NOTE — ED Notes (Signed)
Pt from home c/o SCC that started two days ago. Pt has been taking home meds with no relief. Pt c/o pain in back and stomach. Pt in NAD

## 2015-07-09 ENCOUNTER — Non-Acute Institutional Stay (EMERGENCY_DEPARTMENT_HOSPITAL)
Admission: AD | Admit: 2015-07-09 | Discharge: 2015-07-09 | Disposition: A | Discharge status: Home / Self Care | Payer: Medicaid - Out of State | Source: Ambulatory Visit | Attending: Internal Medicine | Admitting: Internal Medicine

## 2015-07-09 ENCOUNTER — Emergency Department (HOSPITAL_COMMUNITY)
Admission: EM | Admit: 2015-07-09 | Discharge: 2015-07-09 | Disposition: A | Discharge status: Home / Self Care | Payer: Medicaid - Out of State | Attending: Emergency Medicine | Admitting: Emergency Medicine

## 2015-07-09 ENCOUNTER — Other Ambulatory Visit: Payer: Self-pay | Admitting: Internal Medicine

## 2015-07-09 ENCOUNTER — Encounter (HOSPITAL_COMMUNITY): Payer: Self-pay

## 2015-07-09 ENCOUNTER — Emergency Department (HOSPITAL_COMMUNITY): Payer: Medicaid - Out of State

## 2015-07-09 ENCOUNTER — Encounter (HOSPITAL_COMMUNITY): Payer: Self-pay | Admitting: Emergency Medicine

## 2015-07-09 ENCOUNTER — Telehealth: Payer: Self-pay | Admitting: *Deleted

## 2015-07-09 DIAGNOSIS — Z79899 Other long term (current) drug therapy: Secondary | ICD-10-CM | POA: Insufficient documentation

## 2015-07-09 DIAGNOSIS — F17210 Nicotine dependence, cigarettes, uncomplicated: Secondary | ICD-10-CM | POA: Insufficient documentation

## 2015-07-09 DIAGNOSIS — D5700 Hb-SS disease with crisis, unspecified: Secondary | ICD-10-CM

## 2015-07-09 LAB — COMPREHENSIVE METABOLIC PANEL
ALBUMIN: 4.7 g/dL (ref 3.5–5.0)
ALT: 12 U/L — AB (ref 17–63)
AST: 26 U/L (ref 15–41)
Alkaline Phosphatase: 85 U/L (ref 38–126)
Anion gap: 6 (ref 5–15)
BUN: 10 mg/dL (ref 6–20)
CHLORIDE: 110 mmol/L (ref 101–111)
CO2: 25 mmol/L (ref 22–32)
CREATININE: 0.56 mg/dL — AB (ref 0.61–1.24)
Calcium: 9.6 mg/dL (ref 8.9–10.3)
GFR calc Af Amer: >60 mL/min (ref >60)
GFR calc non Af Amer: >60 mL/min (ref >60)
Glucose, Bld: 95 mg/dL (ref 65–99)
Potassium: 4.1 mmol/L (ref 3.5–5.1)
SODIUM: 141 mmol/L (ref 135–145)
Total Bilirubin: 6.3 mg/dL — ABNORMAL HIGH (ref 0.3–1.2)
Total Protein: 8.3 g/dL — ABNORMAL HIGH (ref 6.5–8.1)

## 2015-07-09 LAB — CBC WITH DIFFERENTIAL/PLATELET
Basophils Absolute: 0.1 10*3/uL (ref 0.0–0.1)
Basophils Relative: 1 %
EOS ABS: 0.4 10*3/uL (ref 0.0–0.7)
EOS PCT: 2 %
HCT: 24.4 % — ABNORMAL LOW (ref 39.0–52.0)
HEMOGLOBIN: 8.8 g/dL — AB (ref 13.0–17.0)
LYMPHS ABS: 3.2 10*3/uL (ref 0.7–4.0)
LYMPHS PCT: 18 %
MCH: 33.5 pg (ref 26.0–34.0)
MCHC: 36.1 g/dL — ABNORMAL HIGH (ref 30.0–36.0)
MCV: 92.8 fL (ref 78.0–100.0)
MONOS PCT: 9 %
Monocytes Absolute: 1.6 10*3/uL — ABNORMAL HIGH (ref 0.1–1.0)
NEUTROS PCT: 70 %
Neutro Abs: 12.2 10*3/uL — ABNORMAL HIGH (ref 1.7–7.7)
PLATELETS: 374 10*3/uL (ref 150–400)
RBC: 2.63 MIL/uL — ABNORMAL LOW (ref 4.22–5.81)
RDW: 17.6 % — AB (ref 11.5–15.5)
WBC: 17.4 10*3/uL — ABNORMAL HIGH (ref 4.0–10.5)

## 2015-07-09 LAB — I-STAT TROPONIN, ED: Troponin i, poc: 0 ng/mL (ref 0.00–0.08)

## 2015-07-09 LAB — RETICULOCYTES
RBC.: 2.62 MIL/uL — ABNORMAL LOW (ref 4.22–5.81)
Retic Count, Absolute: 358.9 10*3/uL — ABNORMAL HIGH (ref 19.0–186.0)
Retic Ct Pct: 13.7 % — ABNORMAL HIGH (ref 0.4–3.1)

## 2015-07-09 LAB — LACTATE DEHYDROGENASE: LDH: 284 U/L — ABNORMAL HIGH (ref 98–192)

## 2015-07-09 MED ORDER — ONDANSETRON HCL 4 MG/2ML IJ SOLN
4.0000 mg | Freq: Once | INTRAMUSCULAR | Status: AC
  Administered 2015-07-09: 4 mg via INTRAVENOUS
  Filled 2015-07-09: qty 2

## 2015-07-09 MED ORDER — FOLIC ACID 1 MG PO TABS: 1.0000 mg | ORAL_TABLET | Freq: Every day | ORAL | Status: DC

## 2015-07-09 MED ORDER — OXYCODONE HCL 30 MG PO TABS: 30.0000 mg | ORAL_TABLET | ORAL | Status: DC | PRN

## 2015-07-09 MED ORDER — SODIUM CHLORIDE 0.9% FLUSH: 9.0000 mL | INTRAVENOUS | Status: DC | PRN

## 2015-07-09 MED ORDER — DIPHENHYDRAMINE HCL 12.5 MG/5ML PO ELIX: 12.5000 mg | ORAL_SOLUTION | Freq: Four times a day (QID) | ORAL | Status: DC | PRN

## 2015-07-09 MED ORDER — NALOXONE HCL 0.4 MG/ML IJ SOLN: 0.4000 mg | INTRAMUSCULAR | Status: DC | PRN

## 2015-07-09 MED ORDER — SENNOSIDES-DOCUSATE SODIUM 8.6-50 MG PO TABS: 1.0000 | ORAL_TABLET | Freq: Two times a day (BID) | ORAL | Status: DC

## 2015-07-09 MED ORDER — SODIUM CHLORIDE 0.45 % IV SOLN
INTRAVENOUS | Status: DC
  Administered 2015-07-09: 11:00:00 via INTRAVENOUS

## 2015-07-09 MED ORDER — ONDANSETRON HCL 4 MG/2ML IJ SOLN: 4.0000 mg | Freq: Four times a day (QID) | INTRAMUSCULAR | Status: DC | PRN

## 2015-07-09 MED ORDER — DEXTROSE-NACL 5-0.45 % IV SOLN
INTRAVENOUS | Status: DC
  Administered 2015-07-09: 12:00:00 via INTRAVENOUS

## 2015-07-09 MED ORDER — DIPHENHYDRAMINE HCL 25 MG PO CAPS
25.0000 mg | ORAL_CAPSULE | Freq: Once | ORAL | Status: AC
  Administered 2015-07-09: 25 mg via ORAL
  Filled 2015-07-09: qty 1

## 2015-07-09 MED ORDER — KETOROLAC TROMETHAMINE 30 MG/ML IJ SOLN
30.0000 mg | INTRAMUSCULAR | Status: AC
  Administered 2015-07-09: 30 mg via INTRAVENOUS
  Filled 2015-07-09: qty 1

## 2015-07-09 MED ORDER — DIPHENHYDRAMINE HCL 50 MG/ML IJ SOLN
12.5000 mg | Freq: Four times a day (QID) | INTRAMUSCULAR | Status: DC | PRN
  Filled 2015-07-09: qty 0.25

## 2015-07-09 MED ORDER — HYDROMORPHONE HCL 2 MG/ML IJ SOLN
3.0000 mg | INTRAMUSCULAR | Status: DC | PRN
  Administered 2015-07-09: 3 mg via INTRAVENOUS
  Filled 2015-07-09: qty 2

## 2015-07-09 MED ORDER — KETOROLAC TROMETHAMINE 30 MG/ML IJ SOLN: 30.0000 mg | Freq: Four times a day (QID) | INTRAMUSCULAR | Status: DC

## 2015-07-09 MED ORDER — POLYETHYLENE GLYCOL 3350 17 G PO PACK
17.0000 g | PACK | Freq: Every day | ORAL | Status: DC | PRN
  Filled 2015-07-09: qty 1

## 2015-07-09 MED ORDER — HYDROMORPHONE 1 MG/ML IV SOLN
INTRAVENOUS | Status: DC
  Administered 2015-07-09: 2.4 mg via INTRAVENOUS
  Administered 2015-07-09: 13:00:00 via INTRAVENOUS
  Filled 2015-07-09: qty 25

## 2015-07-09 NOTE — Discharge Instructions (Signed)
Go to the sickle cell clinic, now, for treatment.   Sickle Cell Anemia, Adult Sickle cell anemia is a condition in which red blood cells have an abnormal "sickle" shape. This abnormal shape shortens the cells' life span, which results in a lower than normal concentration of red blood cells in the blood. The sickle shape also causes the cells to clump together and block free blood flow through the blood vessels. As a result, the tissues and organs of the body do not receive enough oxygen. Sickle cell anemia causes organ damage and pain and increases the risk of infection. CAUSES  Sickle cell anemia is a genetic disorder. Those who receive two copies of the gene have the condition, and those who receive one copy have the trait. RISK FACTORS The sickle cell gene is most common in people whose families originated in [place]. Other areas of the globe where sickle cell trait occurs include the [place], [place], the [place], and the [place].  SIGNS AND SYMPTOMS  Pain, especially in the extremities, back, chest, or abdomen (common). The pain may start suddenly or may develop following an illness, especially if there is dehydration. Pain can also occur due to overexertion or exposure to extreme temperature changes.  Frequent severe bacterial infections, especially certain types of pneumonia and meningitis.  Pain and swelling in the hands and feet.  Decreased activity.   Loss of appetite.   Change in behavior.  Headaches.  Seizures.  Shortness of breath or difficulty breathing.  Vision changes.  Skin ulcers. Those with the trait may not have symptoms or they may have mild symptoms.  DIAGNOSIS  Sickle cell anemia is diagnosed with blood tests that demonstrate the genetic trait. It is often diagnosed during the newborn period, due to mandatory testing nationwide. A variety of blood tests, X-rays, CT scans, MRI scans, ultrasounds, and lung function tests may also  be done to monitor the condition. TREATMENT  Sickle cell anemia may be treated with:  Medicines. You may be given pain medicines, antibiotic medicines (to treat and prevent infections) or medicines to increase the production of certain types of hemoglobin.  Fluids.  Oxygen.  Blood transfusions. HOME CARE INSTRUCTIONS   Drink enough fluid to keep your urine clear or pale yellow. Increase your fluid intake in hot weather and during exercise.  Do not smoke. Smoking lowers oxygen levels in the blood.   Only take over-the-counter or prescription medicines for pain, fever, or discomfort as directed by your health care provider.  Take antibiotics as directed by your health care provider. Make sure you finish them it even if you start to feel better.   Take supplements as directed by your health care provider.   Consider wearing a medical alert bracelet. This tells anyone caring for you in an emergency of your condition.   When traveling, keep your medical information, health care provider's names, and the medicines you take with you at all times.   If you develop a fever, do not take medicines to reduce the fever right away. This could cover up a problem that is developing. Notify your health care provider.  Keep all follow-up appointments with your health care provider. Sickle cell anemia requires regular medical care. SEEK MEDICAL CARE IF: You have a fever. SEEK IMMEDIATE MEDICAL CARE IF:   You feel dizzy or faint.   You have new abdominal pain, especially on the left side near the stomach area.   You develop a persistent, often uncomfortable and  painful penile erection (priapism). If this is not treated immediately it will lead to impotence.   You have numbness your arms or legs or you have a hard time moving them.   You have a hard time with speech.   You have a fever or persistent symptoms for more than 2-3 days.   You have a fever and your symptoms suddenly  get worse.   You have signs or symptoms of infection. These include:   Chills.   Abnormal tiredness (lethargy).   Irritability.   Poor eating.   Vomiting.   You develop pain that is not helped with medicine.   You develop shortness of breath.  You have pain in your chest.   You are coughing up pus-like or bloody sputum.   You develop a stiff neck.  Your feet or hands swell or have pain.  Your abdomen appears bloated.  You develop joint pain. MAKE SURE YOU:  Understand these instructions.   This information is not intended to replace advice given to you by your health care provider. Make sure you discuss any questions you have with your health care provider.   Document Released: 05/06/2005 Document Revised: 02/16/2014 Document Reviewed: 09/07/2012 Elsevier Interactive Patient Education [employer]2016 [employer] Inc.

## 2015-07-09 NOTE — Progress Notes (Signed)
Pt discharged to home; discharge instructions explained, given, and signed; all questions answered; no complications noted 

## 2015-07-09 NOTE — Discharge Summary (Signed)
Physician Discharge Summary  Nichole Keltner ZHY:[id number] DOB: 1990/09/15 DOA: 07/09/2015  PCP: Jeanann Lewandowsky, MD  Admit date: 07/09/2015  Discharge date: 07/09/2015  Time spent: 30 minutes  Discharge Diagnoses:  Active Problems:   Sickle cell anemia with crisis [facility])  Discharge Condition: Stable  Diet recommendation: Regular  History of present illness:   Gordie Crumby is a 25 y.o. male with history of sickle cell disease with frequent ED utilization initially seen at the emergency department this morning for generalized body pain mostly in his back, chest and lower limbs that has been going on for about 3 days. He was comprehensively worked up in the ED, no acute finding necessitating hospital admission, patient was transferred to the day hospital for completion of his treatment with extended observation. Patient has been to the ED 7 times this month only for the same sickle cell related pain; he was recently admitted briefly but he left AMA before treatment could be completed. Patient has constantly said his pain medication is not helping, he is currently on oxycodone 20 mg every 4 hr, when necessary for pain and MS Contin 30 mg by mouth twice a day. Of note, patient has not filled his prescription for folic acid and hydroxyurea for months despite repeated counseling and education on the need to be on these maintenance medications for sickle cell disease. Today he denies any history of fever, no urinary symptoms, no change in bowel habits. He complains of unproductive cough, chest x-ray in the ED was normal. Also patient was recently treated with Augmentin and he completed the course.  Hospital Course:  Yadiel Aubry was admitted to the day hospital with sickle cell painful crisis. Patient was treated with weight based IV Dilaudid PCA, IV Toradol as well as IV fluids. Cruze showed significant improvement symptomatically, But before treatment could be completed, he said he had to leave and  requested to be discharged home. He also requested to be taken off the MS Contin because it's not helping and promises to do the "right thing" if his oxycodone can be increased to 30 mg as he was previously on. He signed new controlled substance agreement and will henceforth follow-up regularly with the sickle cell clinic instead of ED overutilization. He also agreed to start taking his folic acid and hydroxyurea as prescribed. Agrees that if found to be non-adherent with his maintenance medications, or continue to overutilize ED or abuse the new increased oxycodone prescription, he will have to look for another medical facility and/or provider for his ongoing care. He verbalized understanding and signed the agreement. He was given 90 tablets of oxycodone 30 mg for the next 15 days to be taken every 4 hourly when necessary.  Discharge Exam: Filed Vitals:   07/09/15 1235 07/09/15 1310  BP:  92/48  Pulse:  71  Temp:    Resp: 16 16   General appearance: alert, cooperative and no distress Eyes: conjunctivae/corneas clear. PERRL, EOM's intact. Fundi benign. Neck: no adenopathy, no carotid bruit, no JVD, supple, symmetrical, trachea midline and thyroid not enlarged, symmetric, no tenderness/mass/nodules Back: symmetric, no curvature. ROM normal. No CVA tenderness. Resp: clear to auscultation bilaterally Chest wall: no tenderness Cardio: regular rate and rhythm, S1, S2 normal, no murmur, click, rub or gallop GI: soft, non-tender; bowel sounds normal; no masses, no organomegaly Extremities: extremities normal, atraumatic, no cyanosis or edema Pulses: 2+ and symmetric Skin: Skin color, texture, turgor normal. No rashes or lesions Neurologic: Grossly normal  Discharge Instructions We discussed the need  for good hydration, monitoring of hydration status, avoidance of heat, cold, stress, and infection triggers. We discussed the need to be compliant with taking Hydrea. [name] was reminded of the need to  seek medical attention of any symptoms of bleeding, anemia, or infection occurs.   Current Discharge Medication List    CONTINUE these medications which have NOT CHANGED   Details  folic acid (FOLVITE) 1 MG tablet Take 1 tablet (1 mg total) by mouth daily. Qty: 90 tablet, Refills: 11   Associated Diagnoses: Sickle cell anemia with pain (HCC)    hydroxyurea (HYDREA) 500 MG capsule Take 2 capsules (1,000 mg total) by mouth daily. May take with food to minimize GI side effects. Qty: 180 capsule, Refills: 3   Associated Diagnoses: Sickle cell anemia with pain (HCC)    Oxycodone HCl 20 MG TABS Take 1 tablet by mouth every 6 (six) hours. Refills: 0      STOP taking these medications     morphine (MS CONTIN) 30 MG 12 hr tablet        No Known Allergies   Significant Diagnostic Studies: Dg Chest 2 View  07/09/2015  CLINICAL DATA:  Chest pain. EXAM: CHEST  2 VIEW COMPARISON:  Jul 03, 2015. FINDINGS: The heart size and mediastinal contours are within normal limits. Both lungs are clear. No pneumothorax or pleural effusion is noted. The visualized skeletal structures are unremarkable. IMPRESSION: No active cardiopulmonary disease. Electronically Signed   By: Lupita RaiderJames  Green Jr, M.D.   On: 07/09/2015 09:30   Dg Chest 2 View  07/03/2015  CLINICAL DATA:  Upper back pain and chest pain. EXAM: CHEST  2 VIEW COMPARISON:  06/24/2015 FINDINGS: There is no focal parenchymal opacity. There is no pleural effusion or pneumothorax. The heart and mediastinal contours are unremarkable. There is a dextrocurvature of the thoracic spine. IMPRESSION: No active cardiopulmonary disease. Electronically Signed   By: Elige KoHetal  Patel   On: 07/03/2015 16:05   Dg Chest 2 View  06/24/2015  CLINICAL DATA:  Sickle cell crisis with chest pain EXAM: CHEST  2 VIEW COMPARISON:  Jun 14, 2015 FINDINGS: There is slight atelectasis in the right base. The lungs elsewhere are clear. Heart size and pulmonary vascularity are normal. No  adenopathy. There is mid thoracic dextroscoliosis. There is evidence of and endplate infarct in a mid thoracic vertebral body consistent with sickle cell disease. No pneumothorax. IMPRESSION: No edema or consolidation.  Slight right base atelectasis. Electronically Signed   By: Bretta BangWilliam  Woodruff III M.D.   On: 06/24/2015 11:58   Dg Chest 2 View  06/14/2015  CLINICAL DATA:  Sickle cell pain crisis. EXAM: CHEST  2 VIEW COMPARISON:  06/08/2015 FINDINGS: Mild vascular congestion. Patchy airspace opacity in the left mid lung and right base could reflect atelectasis or infiltrates. Heart is normal size. No visible effusions or acute bony abnormality. IMPRESSION: Mild vascular congestion. Patchy left perihilar and right infrahilar airspace opacities. Electronically Signed   By: Charlett NoseKevin  Dover M.D.   On: 06/14/2015 12:54    Signed:  Jeanann LewandowskyJEGEDE, Vennie Salsbury MD, MHA, FACP, FAAP, CPE   07/09/2015, 1:37 PM

## 2015-07-09 NOTE — Telephone Encounter (Signed)
Pharmacy called stating the patient presented to the pharmacy to fill oxycodone. Pharmacist states patient only fills the narcotic and does not pick up his medication to manage his condition. Patient states his brother has SCD and he uses his folic acid. Patient was advised of this being illegal and the patient should be filling his own prescriptions which are available for him. MA spoke with patient and relayed providers message which states. Patient is to pick up his folic acid and hydroxyurea as well as the pain medication. Patient was advised if he does not consistently pick up his folic acid and hydroxyurea, the oxycodone will no longer be refilled. Patient expressed his understanding. MA also relayed the message to the pharmacist on staff today. No further questions at this time.

## 2015-07-09 NOTE — ED Notes (Signed)
Per pt, states chest pain and back pain that started last night

## 2015-07-09 NOTE — Progress Notes (Signed)
Pt with 39 [facility] ED visits and 6 admissions in the last 6 months Pt continues with medicaid of [place] coverage and is non compliant with [facility] This pt has been seen and provided resources by Case management providers but is non compliant with follow up with any services provided  Pt last Office visit was on on 06/18/15 with Dr [facility] On 06/14/2015 he had an OV but he left AMA on 06/15/2015 "because he was not getting what he requested for pain. Patient is known to utilize ED very frequently and has missed many outpatient appointments. He only filled half of his last prescription for his regular pain medication because of financial reasons and so he ran out weekly and will go to the ED for IV pain medication. Patient is complaining of cough and occasional fever since leaving the hospital. Check status during the hospital showed patchy left perihilar and right infrahilar airspace opacities, patient claimed he was not treated because he left the hospital before treatment was started. Continue to smoke cigarettes heavily despite repeated counseling. " [facility]  [facility] 27, 24 and 5//17 which was 6 days after last [facility] OV visit   Pt has an upcoming appt - entered in d/c instructions: Julianne HandlerLachina Hollis Go on 07/24/2015 you have a scheduled 4 week follow up appointment with Julianne HandlerLachina Hollis on 07/24/15 at 0830 [address] [place] [place] [address] [phone]

## 2015-07-09 NOTE — Progress Notes (Signed)
Pt BP 92/48; MD notified; PCA infusion paused; will continue to monitor

## 2015-07-09 NOTE — ED Notes (Signed)
RN TO TRANSFER PT WITH IV INTACT

## 2015-07-09 NOTE — ED Notes (Signed)
MD at bedside. 
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
## 2015-07-11 ENCOUNTER — Encounter (HOSPITAL_COMMUNITY): Payer: Self-pay

## 2015-07-11 ENCOUNTER — Emergency Department (HOSPITAL_COMMUNITY): Payer: Medicaid Other

## 2015-07-11 ENCOUNTER — Emergency Department (HOSPITAL_COMMUNITY)
Admission: EM | Admit: 2015-07-11 | Discharge: 2015-07-11 | Disposition: A | Discharge status: Home / Self Care | Payer: Medicaid Other | Attending: Emergency Medicine | Admitting: Emergency Medicine

## 2015-07-11 DIAGNOSIS — Z79899 Other long term (current) drug therapy: Secondary | ICD-10-CM | POA: Insufficient documentation

## 2015-07-11 DIAGNOSIS — D5700 Hb-SS disease with crisis, unspecified: Secondary | ICD-10-CM | POA: Diagnosis not present

## 2015-07-11 DIAGNOSIS — F17210 Nicotine dependence, cigarettes, uncomplicated: Secondary | ICD-10-CM | POA: Diagnosis not present

## 2015-07-11 LAB — CBC WITH DIFFERENTIAL/PLATELET
BASOS PCT: 0 %
Basophils Absolute: 0.1 10*3/uL (ref 0.0–0.1)
EOS ABS: 0.8 10*3/uL — AB (ref 0.0–0.7)
EOS PCT: 3 %
HEMATOCRIT: 22.5 % — AB (ref 39.0–52.0)
Hemoglobin: 8.1 g/dL — ABNORMAL LOW (ref 13.0–17.0)
Lymphocytes Relative: 14 %
Lymphs Abs: 3.4 10*3/uL (ref 0.7–4.0)
MCH: 33.3 pg (ref 26.0–34.0)
MCHC: 36 g/dL (ref 30.0–36.0)
MCV: 92.6 fL (ref 78.0–100.0)
MONO ABS: 2.1 10*3/uL — AB (ref 0.1–1.0)
MONOS PCT: 8 %
Neutro Abs: 18 10*3/uL — ABNORMAL HIGH (ref 1.7–7.7)
Neutrophils Relative %: 75 %
PLATELETS: 322 10*3/uL (ref 150–400)
RBC: 2.43 MIL/uL — ABNORMAL LOW (ref 4.22–5.81)
RDW: 17.6 % — AB (ref 11.5–15.5)
WBC: 24.4 10*3/uL — ABNORMAL HIGH (ref 4.0–10.5)

## 2015-07-11 LAB — URINALYSIS, ROUTINE W REFLEX MICROSCOPIC
BILIRUBIN URINE: NEGATIVE
Glucose, UA: NEGATIVE mg/dL
Hgb urine dipstick: NEGATIVE
KETONES UR: NEGATIVE mg/dL
Leukocytes, UA: NEGATIVE
NITRITE: NEGATIVE
PH: 6.5 (ref 5.0–8.0)
PROTEIN: NEGATIVE mg/dL
Specific Gravity, Urine: 1.008 (ref 1.005–1.030)

## 2015-07-11 LAB — COMPREHENSIVE METABOLIC PANEL
ALBUMIN: 4.3 g/dL (ref 3.5–5.0)
ALK PHOS: 81 U/L (ref 38–126)
ALT: 12 U/L — AB (ref 17–63)
AST: 24 U/L (ref 15–41)
Anion gap: 8 (ref 5–15)
BILIRUBIN TOTAL: 7.4 mg/dL — AB (ref 0.3–1.2)
BUN: 8 mg/dL (ref 6–20)
CALCIUM: 9.1 mg/dL (ref 8.9–10.3)
CO2: 25 mmol/L (ref 22–32)
CREATININE: 0.48 mg/dL — AB (ref 0.61–1.24)
Chloride: 103 mmol/L (ref 101–111)
GFR calc Af Amer: >60 mL/min (ref >60)
GFR calc non Af Amer: >60 mL/min (ref >60)
GLUCOSE: 125 mg/dL — AB (ref 65–99)
Potassium: 3.4 mmol/L — ABNORMAL LOW (ref 3.5–5.1)
SODIUM: 136 mmol/L (ref 135–145)
TOTAL PROTEIN: 7.6 g/dL (ref 6.5–8.1)

## 2015-07-11 MED ORDER — ONDANSETRON HCL 4 MG/2ML IJ SOLN
4.0000 mg | Freq: Once | INTRAMUSCULAR | Status: AC
  Administered 2015-07-11: 4 mg via INTRAVENOUS
  Filled 2015-07-11: qty 2

## 2015-07-11 MED ORDER — DIPHENHYDRAMINE HCL 25 MG PO CAPS
25.0000 mg | ORAL_CAPSULE | Freq: Once | ORAL | Status: AC
  Administered 2015-07-11: 25 mg via ORAL
  Filled 2015-07-11: qty 1

## 2015-07-11 MED ORDER — HYDROMORPHONE HCL 2 MG/ML IJ SOLN
2.0000 mg | Freq: Once | INTRAMUSCULAR | Status: AC
  Administered 2015-07-11: 2 mg via INTRAVENOUS
  Filled 2015-07-11: qty 1

## 2015-07-11 MED ORDER — KETOROLAC TROMETHAMINE 30 MG/ML IJ SOLN
30.0000 mg | Freq: Once | INTRAMUSCULAR | Status: AC
  Administered 2015-07-11: 30 mg via INTRAVENOUS
  Filled 2015-07-11: qty 1

## 2015-07-11 MED ORDER — HYDROMORPHONE HCL 2 MG/ML IJ SOLN: 2.0000 mg | Freq: Once | INTRAMUSCULAR | Status: DC

## 2015-07-11 MED ORDER — KCL IN DEXTROSE-NACL 20-5-0.45 MEQ/L-%-% IV SOLN
Freq: Once | INTRAVENOUS | Status: AC
  Administered 2015-07-11: 08:00:00 via INTRAVENOUS
  Filled 2015-07-11: qty 1000
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
## 2015-07-11 NOTE — ED Notes (Signed)
Patient transported to X-ray 

## 2015-07-11 NOTE — ED Notes (Signed)
Sickle cell present to transfer pt. Pt request to be discharged home. EDPA Josh informed with EDP Dan present. OK to be discharged home. No pain medication to be administered. Verbal order EDPA given. Patient made aware

## 2015-07-11 NOTE — ED Notes (Signed)
Pt complains of back pain and chest congestion with a cough for two days

## 2015-07-15 ENCOUNTER — Telehealth (HOSPITAL_COMMUNITY): Payer: Self-pay | Admitting: *Deleted

## 2015-07-15 NOTE — Telephone Encounter (Signed)
Expand All Collapse All   Called patient to extend an invitation for a Sickle Cell Education Luncheon on June 21. Left message and asked for a return call for confirmation.         

## 2015-07-23 ENCOUNTER — Ambulatory Visit: Payer: Medicaid - Out of State | Admitting: Internal Medicine

## 2015-07-24 ENCOUNTER — Encounter (HOSPITAL_COMMUNITY): Payer: Self-pay

## 2015-07-24 ENCOUNTER — Ambulatory Visit (INDEPENDENT_AMBULATORY_CARE_PROVIDER_SITE_OTHER): Payer: Medicaid Other | Admitting: Family Medicine

## 2015-07-24 ENCOUNTER — Encounter: Payer: Self-pay | Admitting: Family Medicine

## 2015-07-24 ENCOUNTER — Emergency Department (HOSPITAL_COMMUNITY)
Admission: EM | Admit: 2015-07-24 | Discharge: 2015-07-24 | Disposition: A | Discharge status: Home / Self Care | Payer: Medicaid - Out of State | Attending: Emergency Medicine | Admitting: Emergency Medicine

## 2015-07-24 VITALS — BP 111/65 | HR 70 | Temp 98.2°F | Resp 16 | Ht 70.0 in | Wt 127.0 lb

## 2015-07-24 DIAGNOSIS — G8929 Other chronic pain: Secondary | ICD-10-CM

## 2015-07-24 DIAGNOSIS — F17200 Nicotine dependence, unspecified, uncomplicated: Secondary | ICD-10-CM

## 2015-07-24 DIAGNOSIS — F17209 Nicotine dependence, unspecified, with unspecified nicotine-induced disorders: Secondary | ICD-10-CM | POA: Insufficient documentation

## 2015-07-24 DIAGNOSIS — D5700 Hb-SS disease with crisis, unspecified: Secondary | ICD-10-CM

## 2015-07-24 DIAGNOSIS — F17210 Nicotine dependence, cigarettes, uncomplicated: Secondary | ICD-10-CM | POA: Insufficient documentation

## 2015-07-24 LAB — COMPREHENSIVE METABOLIC PANEL
ALBUMIN: 3.7 g/dL (ref 3.5–5.0)
ALT: 10 U/L — ABNORMAL LOW (ref 17–63)
ANION GAP: 4 — AB (ref 5–15)
AST: 23 U/L (ref 15–41)
Alkaline Phosphatase: 75 U/L (ref 38–126)
BUN: 7 mg/dL (ref 6–20)
CO2: 26 mmol/L (ref 22–32)
Calcium: 8.7 mg/dL — ABNORMAL LOW (ref 8.9–10.3)
Chloride: 111 mmol/L (ref 101–111)
Creatinine, Ser: 0.49 mg/dL — ABNORMAL LOW (ref 0.61–1.24)
GFR calc Af Amer: >60 mL/min (ref >60)
GFR calc non Af Amer: >60 mL/min (ref >60)
GLUCOSE: 91 mg/dL (ref 65–99)
POTASSIUM: 3.6 mmol/L (ref 3.5–5.1)
SODIUM: 141 mmol/L (ref 135–145)
Total Bilirubin: 2.6 mg/dL — ABNORMAL HIGH (ref 0.3–1.2)
Total Protein: 6.9 g/dL (ref 6.5–8.1)

## 2015-07-24 LAB — CBC WITH DIFFERENTIAL/PLATELET
BASOS ABS: 0.1 10*3/uL (ref 0.0–0.1)
Basophils Relative: 1 %
Eosinophils Absolute: 0.9 10*3/uL — ABNORMAL HIGH (ref 0.0–0.7)
Eosinophils Relative: 6 %
HEMATOCRIT: 19.8 % — AB (ref 39.0–52.0)
Hemoglobin: 7 g/dL — ABNORMAL LOW (ref 13.0–17.0)
LYMPHS PCT: 33 %
Lymphs Abs: 5 10*3/uL — ABNORMAL HIGH (ref 0.7–4.0)
MCH: 33 pg (ref 26.0–34.0)
MCHC: 35.4 g/dL (ref 30.0–36.0)
MCV: 93.4 fL (ref 78.0–100.0)
MONO ABS: 1.5 10*3/uL — AB (ref 0.1–1.0)
MONOS PCT: 10 %
NEUTROS ABS: 7.7 10*3/uL (ref 1.7–7.7)
Neutrophils Relative %: 50 %
Platelets: 281 10*3/uL (ref 150–400)
RBC: 2.12 MIL/uL — ABNORMAL LOW (ref 4.22–5.81)
RDW: 17.7 % — AB (ref 11.5–15.5)
WBC: 15.2 10*3/uL — ABNORMAL HIGH (ref 4.0–10.5)

## 2015-07-24 LAB — RETICULOCYTES
RBC.: 2.12 MIL/uL — ABNORMAL LOW (ref 4.22–5.81)
RETIC COUNT ABSOLUTE: 197.2 10*3/uL — AB (ref 19.0–186.0)
Retic Ct Pct: 9.3 % — ABNORMAL HIGH (ref 0.4–3.1)

## 2015-07-24 MED ORDER — DIPHENHYDRAMINE HCL 50 MG/ML IJ SOLN
12.5000 mg | Freq: Once | INTRAMUSCULAR | Status: AC
  Administered 2015-07-24: 12.5 mg via INTRAVENOUS
  Filled 2015-07-24: qty 1

## 2015-07-24 MED ORDER — HYDROMORPHONE HCL 2 MG/ML IJ SOLN
2.0000 mg | Freq: Once | INTRAMUSCULAR | Status: AC
  Administered 2015-07-24: 2 mg via INTRAVENOUS
  Filled 2015-07-24: qty 1

## 2015-07-24 MED ORDER — SODIUM CHLORIDE 0.9 % IV BOLUS (SEPSIS)
1000.0000 mL | Freq: Once | INTRAVENOUS | Status: AC
  Administered 2015-07-24: 1000 mL via INTRAVENOUS

## 2015-07-24 MED ORDER — OXYCODONE HCL 30 MG PO TABS: 30.0000 mg | ORAL_TABLET | ORAL | Status: DC | PRN

## 2015-07-24 MED ORDER — FOLIC ACID 1 MG PO TABS: 1.0000 mg | ORAL_TABLET | Freq: Every day | ORAL | Status: DC

## 2015-07-24 MED ORDER — HYDROMORPHONE HCL 2 MG/ML IJ SOLN
3.0000 mg | Freq: Once | INTRAMUSCULAR | Status: AC
  Administered 2015-07-24: 3 mg via INTRAVENOUS
  Filled 2015-07-24: qty 2

## 2015-07-24 NOTE — Progress Notes (Signed)
Subjective:    Patient ID: Matthew Shannon, male    DOB: 09/22/1990, 25 y.o.   MRN: [id number]  HPI [name], a 25 year old male with a history of sickle cell anemia presents for an evaluation of sickle cell anemia, HbSS. [name] has had frequent emergency room visits and admissions over the past several months due to chronic pain. He was in the emergency department this am for pain related to sickle cell anemia. He says that his grandfather was murdered on yesterday and he attributes current crisis to stress. He says that pain intensity is currently 7/10. He describes pain as constant and aching.   He maintains that he is out of pain medications.  Pain is primarily in lower back and lower extremities. He states that pain is worsened by activity. Patient last had home pain medication on yesterday around 10 pm.  He was treated with IV Dilaudid in the emergency department this am with moderate relief. He denies chest pains, shortness of breath, fatigue, dysuria, constipation, nausea, vomiting or diarrhea.   Past Medical History  Diagnosis Date  . Sickle cell anemia (HCC)    Immunization History  Administered Date(s) Administered  . Influenza,inj,Quad PF,36+ Mos 12/17/2014  . Pneumococcal Polysaccharide-23 12/17/2014  No Known Allergies  Social History   Social History  . Marital Status: Single    Spouse Name: N/A  . Number of Children: N/A  . Years of Education: N/A   Occupational History  . Not on file.   Social History Main Topics  . Smoking status: Current Every Day Smoker -- 0.25 packs/day for 0 years    Types: Cigarettes  . Smokeless tobacco: Never Used  . Alcohol Use: No  . Drug Use: No  . Sexual Activity: Not on file   Other Topics Concern  . Not on file   Social History Narrative   Review of Systems  Constitutional: Negative for fever and fatigue.  HENT: Negative.   Eyes: Negative.   Respiratory: Negative.   Cardiovascular: Negative.   Gastrointestinal:  Negative.   Endocrine: Negative.   Genitourinary: Negative.   Musculoskeletal: Positive for myalgias and back pain.  Skin: Negative.   Allergic/Immunologic: Negative.   Neurological: Negative.   Hematological: Negative.   Psychiatric/Behavioral: Negative.  Negative for suicidal ideas and sleep disturbance.      Objective:   Physical Exam  Constitutional: He is oriented to person, place, and time. He appears well-developed and well-nourished.  HENT:  Head: Normocephalic and atraumatic.  Right Ear: External ear normal.  Left Ear: External ear normal.  Mouth/Throat: Oropharynx is clear and moist.  Eyes: Conjunctivae and EOM are normal. Pupils are equal, round, and reactive to light.  Neck: Normal range of motion. Neck supple.  Cardiovascular: Normal rate, regular rhythm and intact distal pulses.   Murmur heard. Abdominal: Soft. Bowel sounds are normal.  Musculoskeletal: Normal range of motion.  Neurological: He is alert and oriented to person, place, and time. He has normal reflexes.  Skin: Skin is warm and dry.  Psychiatric: He has a normal mood and affect. His behavior is normal. Judgment and thought content normal.         BP 111/65 mmHg  Pulse 70  Temp(Src) 98.2 F (36.8 C) (Oral)  Resp 16  Ht 5\' 10"  (1.778 m)  Wt 127 lb (57.607 kg)  BMI 18.22 kg/m2  SpO2 100% Assessment & Plan:  1. Sickle cell pain crisis (HCC)  Will increase hydrea to 1000 mg daily.  Reviewed labs, ANC >2, hemoglobin 7.0 and platelet count is 281 on 07/24/2015.  We discussed the need for good hydration, monitoring of hydration status, avoidance of heat, cold, stress, and infection triggers. We discussed the risks and benefits of Hydrea, including bone marrow suppression, the possibility of GI upset, skin ulcers, hair thinning, and teratogenicity. The patient was reminded of the need to seek medical attention of any symptoms of bleeding, anemia, or infection. Continue folic acid 1 mg daily to prevent  aplastic bone marrow crises.   Pulmonary evaluation - Patient denies severe recurrent wheezes, shortness of breath with exercise, or persistent cough. If these symptoms develop, pulmonary function tests with spirometry will be ordered, and if abnormal, plan on referral to Pulmonology for further evaluation. Reminded Matthew Shannon of the dangers of smoking and sickle cell anemia  Cardiac - Routine screening for pulmonary hypertension is not recommended.    Acute and chronic painful episodes - We will re-start opiate medications. Patient will receive Oxycodone 30 mg every 4 hours as needed # 90 per Dr. Hyman Matthew Shannon.  Advised patient to uses sickle cell day hospital for acute pain episodes. Reminded Matthew Shannon of hours of operation.    We discussed that pt is to receive hisSchedule II prescriptions only from [place]. Pt is also aware that the prescription history is available to [place] online through the [facility] CSRS. Controlled substance agreement signed 06/18/2015. We reminded [name] that all patients receiving Schedule II narcotics must be seen for follow within one month of prescription being requested. We reviewed the terms of our pain agreement, including the need to keep medicines in a safe locked location away from children or pets, and the need to report excess sedation or constipation, measures to avoid constipation, and policies related to early refills and stolen prescriptions. According to the [place] [facility] program, we have reviewed details related to analgesia, adverse effects, aberrant behaviors. Reviewed [place] Substance Reporting system prior to prescribing opiate medications, no inconsistencies noted.    - oxycodone (ROXICODONE) 30 MG immediate release tablet; Take 1 tablet (30 mg total) by mouth every 4 (four) hours as needed for pain.  Dispense: 90 tablet; Refill: 0 - folic acid (FOLVITE) 1 MG tablet; Take 1 tablet (1 mg total) by mouth daily.  Dispense: 90 tablet; Refill: 11 - Pain Mgmt, Profile 8  w/Conf, U  2. Chronic pain - Pain Mgmt, Profile 8 w/Conf, U  3. Tobacco dependence Smoking cessation instruction/counseling given:  counseled patient on the dangers of tobacco use, advised patient to stop smoking, and reviewed strategies to maximize success  RTC:  1 month  for medication management  Massie Maroon, FNP

## 2015-07-24 NOTE — ED Notes (Signed)
Pt complains of sickle cell pain in his legs and lower back for two days
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
## 2015-07-24 NOTE — Patient Instructions (Signed)
Sickle Cell Anemia, Adult Sickle cell anemia is a condition in which red blood cells have an abnormal "sickle" shape. This abnormal shape shortens the cells' life span, which results in a lower than normal concentration of red blood cells in the blood. The sickle shape also causes the cells to clump together and block free blood flow through the blood vessels. As a result, the tissues and organs of the body do not receive enough oxygen. Sickle cell anemia causes organ damage and pain and increases the risk of infection. CAUSES  Sickle cell anemia is a genetic disorder. Those who receive two copies of the gene have the condition, and those who receive one copy have the trait. RISK FACTORS The sickle cell gene is most common in people whose families originated in [place]. Other areas of the globe where sickle cell trait occurs include the Mediterranean, [place] and [place], the [place], and the [place].  SIGNS AND SYMPTOMS  Pain, especially in the extremities, back, chest, or abdomen (common). The pain may start suddenly or may develop following an illness, especially if there is dehydration. Pain can also occur due to overexertion or exposure to extreme temperature changes.  Frequent severe bacterial infections, especially certain types of pneumonia and meningitis.  Pain and swelling in the hands and feet.  Decreased activity.   Loss of appetite.   Change in behavior.  Headaches.  Seizures.  Shortness of breath or difficulty breathing.  Vision changes.  Skin ulcers. Those with the trait may not have symptoms or they may have mild symptoms.  DIAGNOSIS  Sickle cell anemia is diagnosed with blood tests that demonstrate the genetic trait. It is often diagnosed during the newborn period, due to mandatory testing nationwide. A variety of blood tests, X-rays, CT scans, MRI scans, ultrasounds, and lung function tests may also be done to monitor the condition. TREATMENT  Sickle  cell anemia may be treated with:  Medicines. You may be given pain medicines, antibiotic medicines (to treat and prevent infections) or medicines to increase the production of certain types of hemoglobin.  Fluids.  Oxygen.  Blood transfusions. HOME CARE INSTRUCTIONS   Drink enough fluid to keep your urine clear or pale yellow. Increase your fluid intake in hot weather and during exercise.  Do not smoke. Smoking lowers oxygen levels in the blood.   Only take over-the-counter or prescription medicines for pain, fever, or discomfort as directed by your health care provider.  Take antibiotics as directed by your health care provider. Make sure you finish them it even if you start to feel better.   Take supplements as directed by your health care provider.   Consider wearing a medical alert bracelet. This tells anyone caring for you in an emergency of your condition.   When traveling, keep your medical information, health care provider's names, and the medicines you take with you at all times.   If you develop a fever, do not take medicines to reduce the fever right away. This could cover up a problem that is developing. Notify your health care provider.  Keep all follow-up appointments with your health care provider. Sickle cell anemia requires regular medical care. SEEK MEDICAL CARE IF: You have a fever. SEEK IMMEDIATE MEDICAL CARE IF:   You feel dizzy or faint.   You have new abdominal pain, especially on the left side near the stomach area.   You develop a persistent, often uncomfortable and painful penile erection (priapism). If this is not treated immediately it   will lead to impotence.   You have numbness your arms or legs or you have a hard time moving them.   You have a hard time with speech.   You have a fever or persistent symptoms for more than 2-3 days.   You have a fever and your symptoms suddenly get worse.   You have signs or symptoms of infection.  These include:   Chills.   Abnormal tiredness (lethargy).   Irritability.   Poor eating.   Vomiting.   You develop pain that is not helped with medicine.   You develop shortness of breath.  You have pain in your chest.   You are coughing up pus-like or bloody sputum.   You develop a stiff neck.  Your feet or hands swell or have pain.  Your abdomen appears bloated.  You develop joint pain. MAKE SURE YOU:  Understand these instructions.   This information is not intended to replace advice given to you by your health care provider. Make sure you discuss any questions you have with your health care provider.   Document Released: 05/06/2005 Document Revised: 02/16/2014 Document Reviewed: 09/07/2012 Elsevier Interactive Patient Education 2016 Elsevier Inc. Chronic Pain Chronic pain can be defined as pain that is off and on and lasts for 3-6 months or longer. Many things cause chronic pain, which can make it difficult to make a diagnosis. There are many treatment options available for chronic pain. However, finding a treatment that works well for you may require trying various approaches until the right one is found. Many people benefit from a combination of two or more types of treatment to control their pain. SYMPTOMS  Chronic pain can occur anywhere in the body and can range from mild to very severe. Some types of chronic pain include:  Headache.  Low back pain.  Cancer pain.  Arthritis pain.  Neurogenic pain. This is pain resulting from damage to nerves. People with chronic pain may also have other symptoms such as:  Depression.  Anger.  Insomnia.  Anxiety. DIAGNOSIS  Your health care provider will help diagnose your condition over time. In many cases, the initial focus will be on excluding possible conditions that could be causing the pain. Depending on your symptoms, your health care provider may order tests to diagnose your condition. Some of  these tests may include:   Blood tests.   CT scan.   MRI.   X-rays.   Ultrasounds.   Nerve conduction studies.  You may need to see a specialist.  TREATMENT  Finding treatment that works well may take time. You may be referred to a pain specialist. He or she may prescribe medicine or therapies, such as:   Mindful meditation or yoga.  Shots (injections) of numbing or pain-relieving medicines into the spine or area of pain.  Local electrical stimulation.  Acupuncture.   Massage therapy.   Aroma, color, light, or sound therapy.   Biofeedback.   Working with a physical therapist to keep from getting stiff.   Regular, gentle exercise.   Cognitive or behavioral therapy.   Group support.  Sometimes, surgery may be recommended.  HOME CARE INSTRUCTIONS   Take all medicines as directed by your health care provider.   Lessen stress in your life by relaxing and doing things such as listening to calming music.   Exercise or be active as directed by your health care provider.   Eat a healthy diet and include things such as vegetables, fruits, fish, and lean  meats in your diet.   Keep all follow-up appointments with your health care provider.   Attend a support group with others suffering from chronic pain. SEEK MEDICAL CARE IF:   Your pain gets worse.   You develop a new pain that was not there before.   You cannot tolerate medicines given to you by your health care provider.   You have new symptoms since your last visit with your health care provider.  SEEK IMMEDIATE MEDICAL CARE IF:   You feel weak.   You have decreased sensation or numbness.   You lose control of bowel or bladder function.   Your pain suddenly gets much worse.   You develop shaking.  You develop chills.  You develop confusion.  You develop chest pain.  You develop shortness of breath.  MAKE SURE YOU:  Understand these instructions.  Will watch your  condition.  Will get help right away if you are not doing well or get worse.   This information is not intended to replace advice given to you by your health care provider. Make sure you discuss any questions you have with your health care provider.   Document Released: 10/18/2001 Document Revised: 09/28/2012 Document Reviewed: 07/22/2012 Elsevier Interactive Patient Education [employer]2016 Elsevier Inc.

## 2015-07-24 NOTE — Discharge Instructions (Signed)
Sickle Cell Anemia, Adult Sickle cell anemia is a condition in which red blood cells have an abnormal "sickle" shape. This abnormal shape shortens the cells' life span, which results in a lower than normal concentration of red blood cells in the blood. The sickle shape also causes the cells to clump together and block free blood flow through the blood vessels. As a result, the tissues and organs of the body do not receive enough oxygen. Sickle cell anemia causes organ damage and pain and increases the risk of infection. CAUSES  Sickle cell anemia is a genetic disorder. Those who receive two copies of the gene have the condition, and those who receive one copy have the trait. RISK FACTORS The sickle cell gene is most common in people whose families originated in [place]. Other areas of the globe where sickle cell trait occurs include the Mediterranean, [place] and [place], the [place], and the [place].  SIGNS AND SYMPTOMS  Pain, especially in the extremities, back, chest, or abdomen (common). The pain may start suddenly or may develop following an illness, especially if there is dehydration. Pain can also occur due to overexertion or exposure to extreme temperature changes.  Frequent severe bacterial infections, especially certain types of pneumonia and meningitis.  Pain and swelling in the hands and feet.  Decreased activity.   Loss of appetite.   Change in behavior.  Headaches.  Seizures.  Shortness of breath or difficulty breathing.  Vision changes.  Skin ulcers. Those with the trait may not have symptoms or they may have mild symptoms.  DIAGNOSIS  Sickle cell anemia is diagnosed with blood tests that demonstrate the genetic trait. It is often diagnosed during the newborn period, due to mandatory testing nationwide. A variety of blood tests, X-rays, CT scans, MRI scans, ultrasounds, and lung function tests may also be done to monitor the condition. TREATMENT  Sickle  cell anemia may be treated with:  Medicines. You may be given pain medicines, antibiotic medicines (to treat and prevent infections) or medicines to increase the production of certain types of hemoglobin.  Fluids.  Oxygen.  Blood transfusions. HOME CARE INSTRUCTIONS   Drink enough fluid to keep your urine clear or pale yellow. Increase your fluid intake in hot weather and during exercise.  Do not smoke. Smoking lowers oxygen levels in the blood.   Only take over-the-counter or prescription medicines for pain, fever, or discomfort as directed by your health care provider.  Take antibiotics as directed by your health care provider. Make sure you finish them it even if you start to feel better.   Take supplements as directed by your health care provider.   Consider wearing a medical alert bracelet. This tells anyone caring for you in an emergency of your condition.   When traveling, keep your medical information, health care provider's names, and the medicines you take with you at all times.   If you develop a fever, do not take medicines to reduce the fever right away. This could cover up a problem that is developing. Notify your health care provider.  Keep all follow-up appointments with your health care provider. Sickle cell anemia requires regular medical care. SEEK MEDICAL CARE IF: You have a fever. SEEK IMMEDIATE MEDICAL CARE IF:   You feel dizzy or faint.   You have new abdominal pain, especially on the left side near the stomach area.   You develop a persistent, often uncomfortable and painful penile erection (priapism). If this is not treated immediately it   will lead to impotence.   You have numbness your arms or legs or you have a hard time moving them.   You have a hard time with speech.   You have a fever or persistent symptoms for more than 2-3 days.   You have a fever and your symptoms suddenly get worse.   You have signs or symptoms of infection.  These include:   Chills.   Abnormal tiredness (lethargy).   Irritability.   Poor eating.   Vomiting.   You develop pain that is not helped with medicine.   You develop shortness of breath.  You have pain in your chest.   You are coughing up pus-like or bloody sputum.   You develop a stiff neck.  Your feet or hands swell or have pain.  Your abdomen appears bloated.  You develop joint pain. MAKE SURE YOU:  Understand these instructions.   This information is not intended to replace advice given to you by your health care provider. Make sure you discuss any questions you have with your health care provider.   Document Released: 05/06/2005 Document Revised: 02/16/2014 Document Reviewed: 09/07/2012 Elsevier Interactive Patient Education 2016 Elsevier Inc.  

## 2015-07-24 NOTE — ED Notes (Addendum)
RN will draw labs while placing IV 

## 2015-07-29 LAB — PAIN MGMT, PROFILE 8 W/CONF, U
6 Acetylmorphine: NEGATIVE ng/mL (ref <10)
ALCOHOL METABOLITES: NEGATIVE ng/mL (ref <500)
AMPHETAMINES: NEGATIVE ng/mL (ref <500)
BENZODIAZEPINES: NEGATIVE ng/mL (ref <100)
BUPRENORPHINE: NEGATIVE ng/mL (ref <5)
Benzoylecgonine: 405 ng/mL — ABNORMAL HIGH (ref <100)
CODEINE: NEGATIVE ng/mL (ref <50)
Cocaine Metabolite: POSITIVE ng/mL — AB (ref <150)
Creatinine: 53.3 mg/dL (ref >=20.0)
HYDROCODONE: 475 ng/mL — AB (ref <50)
Hydromorphone: 4008 ng/mL — ABNORMAL HIGH (ref <50)
MDMA: NEGATIVE ng/mL (ref <500)
Marijuana Metabolite: NEGATIVE ng/mL (ref <20)
Morphine: NEGATIVE ng/mL (ref <50)
NORHYDROCODONE: 1257 ng/mL — AB (ref <50)
NOROXYCODONE: 2015 ng/mL — AB (ref <50)
OPIATES: POSITIVE ng/mL — AB (ref <100)
Oxidant: NEGATIVE ug/mL (ref <200)
Oxycodone: 230 ng/mL — ABNORMAL HIGH (ref <50)
Oxycodone: POSITIVE ng/mL — AB (ref <100)
Oxymorphone: 2148 ng/mL — ABNORMAL HIGH (ref <50)
Please note:: 0
pH: 7.35 (ref 4.5–9.0)

## 2015-07-30 ENCOUNTER — Encounter: Payer: Self-pay | Admitting: Family Medicine

## 2015-08-01 ENCOUNTER — Emergency Department (HOSPITAL_COMMUNITY): Payer: Medicaid - Out of State

## 2015-08-01 ENCOUNTER — Emergency Department (HOSPITAL_COMMUNITY)
Admission: EM | Admit: 2015-08-01 | Discharge: 2015-08-01 | Disposition: A | Discharge status: Home / Self Care | Payer: Medicaid - Out of State | Attending: Emergency Medicine | Admitting: Emergency Medicine

## 2015-08-01 ENCOUNTER — Encounter (HOSPITAL_COMMUNITY): Payer: Self-pay

## 2015-08-01 ENCOUNTER — Other Ambulatory Visit: Payer: Self-pay

## 2015-08-01 DIAGNOSIS — D5700 Hb-SS disease with crisis, unspecified: Secondary | ICD-10-CM | POA: Insufficient documentation

## 2015-08-01 DIAGNOSIS — F17210 Nicotine dependence, cigarettes, uncomplicated: Secondary | ICD-10-CM | POA: Insufficient documentation

## 2015-08-01 LAB — RETICULOCYTES
RBC.: 2.18 MIL/uL — AB (ref 4.22–5.81)
RETIC COUNT ABSOLUTE: 294.3 10*3/uL — AB (ref 19.0–186.0)
Retic Ct Pct: 13.5 % — ABNORMAL HIGH (ref 0.4–3.1)

## 2015-08-01 LAB — BASIC METABOLIC PANEL
Anion gap: 7 (ref 5–15)
BUN: 6 mg/dL (ref 6–20)
CALCIUM: 9.1 mg/dL (ref 8.9–10.3)
CO2: 25 mmol/L (ref 22–32)
CREATININE: 0.5 mg/dL — AB (ref 0.61–1.24)
Chloride: 105 mmol/L (ref 101–111)
Glucose, Bld: 111 mg/dL — ABNORMAL HIGH (ref 65–99)
Potassium: 3.7 mmol/L (ref 3.5–5.1)
SODIUM: 137 mmol/L (ref 135–145)

## 2015-08-01 LAB — CBC
HCT: 19.9 % — ABNORMAL LOW (ref 39.0–52.0)
HEMOGLOBIN: 7.3 g/dL — AB (ref 13.0–17.0)
MCH: 33.5 pg (ref 26.0–34.0)
MCHC: 36.7 g/dL — AB (ref 30.0–36.0)
MCV: 91.3 fL (ref 78.0–100.0)
Platelets: 296 10*3/uL (ref 150–400)
RBC: 2.18 MIL/uL — ABNORMAL LOW (ref 4.22–5.81)
RDW: 17.9 % — AB (ref 11.5–15.5)
WBC: 15.3 10*3/uL — ABNORMAL HIGH (ref 4.0–10.5)

## 2015-08-01 MED ORDER — DIPHENHYDRAMINE HCL 50 MG/ML IJ SOLN
25.0000 mg | Freq: Once | INTRAMUSCULAR | Status: AC
  Administered 2015-08-01: 25 mg via INTRAVENOUS
  Filled 2015-08-01: qty 1

## 2015-08-01 MED ORDER — SODIUM CHLORIDE 0.45 % IV SOLN
INTRAVENOUS | Status: DC
  Administered 2015-08-01: 17:00:00 via INTRAVENOUS

## 2015-08-01 MED ORDER — OXYCODONE HCL 5 MG PO TABS
5.0000 mg | ORAL_TABLET | Freq: Once | ORAL | Status: AC
  Administered 2015-08-01: 5 mg via ORAL
  Filled 2015-08-01: qty 1

## 2015-08-01 MED ORDER — HYDROMORPHONE HCL 2 MG/ML IJ SOLN
3.0000 mg | INTRAMUSCULAR | Status: DC | PRN
  Administered 2015-08-01 (×3): 3 mg via INTRAVENOUS
  Filled 2015-08-01 (×3): qty 2

## 2015-08-01 MED ORDER — PROMETHAZINE HCL 25 MG PO TABS
25.0000 mg | ORAL_TABLET | ORAL | Status: DC | PRN
  Administered 2015-08-01: 25 mg via ORAL
  Filled 2015-08-01: qty 1

## 2015-08-01 MED ORDER — KETOROLAC TROMETHAMINE 30 MG/ML IJ SOLN
30.0000 mg | Freq: Once | INTRAMUSCULAR | Status: AC
  Administered 2015-08-01: 30 mg via INTRAVENOUS
  Filled 2015-08-01: qty 1

## 2015-08-01 NOTE — Progress Notes (Signed)
Pt with 35 [facility] ED visits and 4 admissions in the last 6 months Pt continues with medicaid of  coverage and is non [facility] This pt has been seen and provided resources by Case management providers but is non compliant with follow up with any services provided  On 06/14/2015 he had an OV but he left AMA on 06/15/2015 "because he was not getting what he requested for pain. Patient is known to utilize ED very frequently and has missed many outpatient appointments.  Continue to smoke cigarettes heavily despite repeated counseling. " [facility]  Pt has an ED CP

## 2015-08-01 NOTE — ED Notes (Signed)
Pt with chest pain and back pain since yesterday.  Pt has sickle cell pain.  States this feels same.  No fever.  No cough/congestion

## 2015-08-01 NOTE — ED Notes (Signed)
Patient would like to wait for IV start to have his blood drawn.
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
## 2015-08-01 NOTE — Discharge Instructions (Signed)
Sickle Cell Anemia, Adult Sickle cell anemia is a condition in which red blood cells have an abnormal "sickle" shape. This abnormal shape shortens the cells' life span, which results in a lower than normal concentration of red blood cells in the blood. The sickle shape also causes the cells to clump together and block free blood flow through the blood vessels. As a result, the tissues and organs of the body do not receive enough oxygen. Sickle cell anemia causes organ damage and pain and increases the risk of infection. CAUSES  Sickle cell anemia is a genetic disorder. Those who receive two copies of the gene have the condition, and those who receive one copy have the trait. RISK FACTORS The sickle cell gene is most common in people whose families originated in [place]. Other areas of the globe where sickle cell trait occurs include the Mediterranean, [place] and [place], the [place], and the [place].  SIGNS AND SYMPTOMS  Pain, especially in the extremities, back, chest, or abdomen (common). The pain may start suddenly or may develop following an illness, especially if there is dehydration. Pain can also occur due to overexertion or exposure to extreme temperature changes.  Frequent severe bacterial infections, especially certain types of pneumonia and meningitis.  Pain and swelling in the hands and feet.  Decreased activity.   Loss of appetite.   Change in behavior.  Headaches.  Seizures.  Shortness of breath or difficulty breathing.  Vision changes.  Skin ulcers. Those with the trait may not have symptoms or they may have mild symptoms.  DIAGNOSIS  Sickle cell anemia is diagnosed with blood tests that demonstrate the genetic trait. It is often diagnosed during the newborn period, due to mandatory testing nationwide. A variety of blood tests, X-rays, CT scans, MRI scans, ultrasounds, and lung function tests may also be done to monitor the condition. TREATMENT  Sickle  cell anemia may be treated with:  Medicines. You may be given pain medicines, antibiotic medicines (to treat and prevent infections) or medicines to increase the production of certain types of hemoglobin.  Fluids.  Oxygen.  Blood transfusions. HOME CARE INSTRUCTIONS   Drink enough fluid to keep your urine clear or pale yellow. Increase your fluid intake in hot weather and during exercise.  Do not smoke. Smoking lowers oxygen levels in the blood.   Only take over-the-counter or prescription medicines for pain, fever, or discomfort as directed by your health care provider.  Take antibiotics as directed by your health care provider. Make sure you finish them it even if you start to feel better.   Take supplements as directed by your health care provider.   Consider wearing a medical alert bracelet. This tells anyone caring for you in an emergency of your condition.   When traveling, keep your medical information, health care provider's names, and the medicines you take with you at all times.   If you develop a fever, do not take medicines to reduce the fever right away. This could cover up a problem that is developing. Notify your health care provider.  Keep all follow-up appointments with your health care provider. Sickle cell anemia requires regular medical care. SEEK MEDICAL CARE IF: You have a fever. SEEK IMMEDIATE MEDICAL CARE IF:   You feel dizzy or faint.   You have new abdominal pain, especially on the left side near the stomach area.   You develop a persistent, often uncomfortable and painful penile erection (priapism). If this is not treated immediately it   will lead to impotence.   You have numbness your arms or legs or you have a hard time moving them.   You have a hard time with speech.   You have a fever or persistent symptoms for more than 2-3 days.   You have a fever and your symptoms suddenly get worse.   You have signs or symptoms of infection.  These include:   Chills.   Abnormal tiredness (lethargy).   Irritability.   Poor eating.   Vomiting.   You develop pain that is not helped with medicine.   You develop shortness of breath.  You have pain in your chest.   You are coughing up pus-like or bloody sputum.   You develop a stiff neck.  Your feet or hands swell or have pain.  Your abdomen appears bloated.  You develop joint pain. MAKE SURE YOU:  Understand these instructions.   This information is not intended to replace advice given to you by your health care provider. Make sure you discuss any questions you have with your health care provider.   Document Released: 05/06/2005 Document Revised: 02/16/2014 Document Reviewed: 09/07/2012 Elsevier Interactive Patient Education 2016 Elsevier Inc.  

## 2015-08-04 ENCOUNTER — Emergency Department (HOSPITAL_COMMUNITY)
Admission: EM | Admit: 2015-08-04 | Discharge: 2015-08-04 | Disposition: A | Discharge status: Home / Self Care | Payer: Medicaid Other | Attending: Emergency Medicine | Admitting: Emergency Medicine

## 2015-08-04 ENCOUNTER — Encounter (HOSPITAL_COMMUNITY): Payer: Self-pay | Admitting: *Deleted

## 2015-08-04 DIAGNOSIS — M545 Low back pain: Secondary | ICD-10-CM | POA: Insufficient documentation

## 2015-08-04 DIAGNOSIS — M79606 Pain in leg, unspecified: Secondary | ICD-10-CM | POA: Diagnosis not present

## 2015-08-04 DIAGNOSIS — Z79899 Other long term (current) drug therapy: Secondary | ICD-10-CM | POA: Insufficient documentation

## 2015-08-04 DIAGNOSIS — F17210 Nicotine dependence, cigarettes, uncomplicated: Secondary | ICD-10-CM | POA: Insufficient documentation

## 2015-08-04 DIAGNOSIS — G8929 Other chronic pain: Secondary | ICD-10-CM | POA: Diagnosis not present

## 2015-08-04 DIAGNOSIS — D5700 Hb-SS disease with crisis, unspecified: Secondary | ICD-10-CM | POA: Diagnosis present

## 2015-08-04 MED ORDER — PROMETHAZINE HCL 25 MG/ML IJ SOLN
25.0000 mg | Freq: Once | INTRAMUSCULAR | Status: AC
  Administered 2015-08-04: 25 mg via INTRAVENOUS
  Filled 2015-08-04: qty 1

## 2015-08-04 MED ORDER — DEXTROSE-NACL 5-0.45 % IV SOLN
INTRAVENOUS | Status: DC
  Administered 2015-08-04: 21:00:00 via INTRAVENOUS

## 2015-08-04 MED ORDER — HYDROMORPHONE HCL 2 MG/ML IJ SOLN
3.0000 mg | INTRAMUSCULAR | Status: AC | PRN
  Administered 2015-08-04 (×2): 3 mg via INTRAVENOUS
  Filled 2015-08-04 (×2): qty 2

## 2015-08-04 MED ORDER — DIPHENHYDRAMINE HCL 50 MG/ML IJ SOLN
INTRAMUSCULAR | Status: AC
  Filled 2015-08-04: qty 1

## 2015-08-04 MED ORDER — DIPHENHYDRAMINE HCL 25 MG PO CAPS
25.0000 mg | ORAL_CAPSULE | Freq: Once | ORAL | Status: AC
  Administered 2015-08-04: 25 mg via ORAL
  Filled 2015-08-04: qty 1

## 2015-08-04 MED ORDER — DIPHENHYDRAMINE HCL 50 MG/ML IJ SOLN
25.0000 mg | Freq: Once | INTRAMUSCULAR | Status: AC
  Administered 2015-08-04: 25 mg via INTRAVENOUS

## 2015-08-04 NOTE — ED Notes (Signed)
Pt states he took 20mg  of Oxycodone at 1200 today.

## 2015-08-04 NOTE — ED Notes (Signed)
Pt with history of sickle cell disease complains of back and leg pain for the past 2 days. Pt states pain is 8/10, last took pain medication at 12PM today.

## 2015-08-04 NOTE — Discharge Instructions (Signed)
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
## 2015-08-05 ENCOUNTER — Telehealth (HOSPITAL_COMMUNITY): Payer: Self-pay | Admitting: Internal Medicine

## 2015-08-05 ENCOUNTER — Emergency Department (HOSPITAL_COMMUNITY)
Admission: EM | Admit: 2015-08-05 | Discharge: 2015-08-05 | Disposition: A | Discharge status: Home / Self Care | Payer: Medicaid Other | Attending: Emergency Medicine | Admitting: Emergency Medicine

## 2015-08-05 ENCOUNTER — Encounter (HOSPITAL_COMMUNITY): Payer: Self-pay | Admitting: Emergency Medicine

## 2015-08-05 ENCOUNTER — Emergency Department (HOSPITAL_COMMUNITY)
Admission: EM | Admit: 2015-08-05 | Discharge: 2015-08-05 | Disposition: A | Discharge status: Home / Self Care | Payer: Medicaid Other | Source: Home / Self Care | Attending: Emergency Medicine | Admitting: Emergency Medicine

## 2015-08-05 DIAGNOSIS — F17210 Nicotine dependence, cigarettes, uncomplicated: Secondary | ICD-10-CM | POA: Insufficient documentation

## 2015-08-05 DIAGNOSIS — M549 Dorsalgia, unspecified: Principal | ICD-10-CM

## 2015-08-05 DIAGNOSIS — D5700 Hb-SS disease with crisis, unspecified: Secondary | ICD-10-CM | POA: Diagnosis present

## 2015-08-05 DIAGNOSIS — G8929 Other chronic pain: Secondary | ICD-10-CM | POA: Insufficient documentation

## 2015-08-05 DIAGNOSIS — M545 Low back pain: Secondary | ICD-10-CM | POA: Insufficient documentation

## 2015-08-05 LAB — COMPREHENSIVE METABOLIC PANEL
ALBUMIN: 4.5 g/dL (ref 3.5–5.0)
ALT: 12 U/L — ABNORMAL LOW (ref 17–63)
ANION GAP: 7 (ref 5–15)
AST: 29 U/L (ref 15–41)
Alkaline Phosphatase: 74 U/L (ref 38–126)
BUN: 9 mg/dL (ref 6–20)
CHLORIDE: 106 mmol/L (ref 101–111)
CO2: 26 mmol/L (ref 22–32)
Calcium: 9.3 mg/dL (ref 8.9–10.3)
Creatinine, Ser: 0.62 mg/dL (ref 0.61–1.24)
GFR calc Af Amer: >60 mL/min (ref >60)
GFR calc non Af Amer: >60 mL/min (ref >60)
GLUCOSE: 78 mg/dL (ref 65–99)
POTASSIUM: 3.9 mmol/L (ref 3.5–5.1)
SODIUM: 139 mmol/L (ref 135–145)
TOTAL PROTEIN: 8.2 g/dL — AB (ref 6.5–8.1)
Total Bilirubin: 4.3 mg/dL — ABNORMAL HIGH (ref 0.3–1.2)

## 2015-08-05 LAB — CBC WITH DIFFERENTIAL/PLATELET
BASOS ABS: 0.1 10*3/uL (ref 0.0–0.1)
BASOS PCT: 1 %
EOS ABS: 0.8 10*3/uL — AB (ref 0.0–0.7)
Eosinophils Relative: 5 %
HCT: 23.2 % — ABNORMAL LOW (ref 39.0–52.0)
Hemoglobin: 8.4 g/dL — ABNORMAL LOW (ref 13.0–17.0)
Lymphocytes Relative: 20 %
Lymphs Abs: 3.4 10*3/uL (ref 0.7–4.0)
MCH: 32.1 pg (ref 26.0–34.0)
MCHC: 36.2 g/dL — AB (ref 30.0–36.0)
MCV: 88.5 fL (ref 78.0–100.0)
MONO ABS: 1.5 10*3/uL — AB (ref 0.1–1.0)
MONOS PCT: 9 %
NEUTROS PCT: 65 %
Neutro Abs: 11.2 10*3/uL — ABNORMAL HIGH (ref 1.7–7.7)
Platelets: 329 10*3/uL (ref 150–400)
RBC: 2.62 MIL/uL — ABNORMAL LOW (ref 4.22–5.81)
RDW: 19.6 % — AB (ref 11.5–15.5)
WBC: 17.1 10*3/uL — ABNORMAL HIGH (ref 4.0–10.5)

## 2015-08-05 LAB — RETICULOCYTES
RBC.: 2.62 MIL/uL — ABNORMAL LOW (ref 4.22–5.81)
RETIC COUNT ABSOLUTE: 411.3 10*3/uL — AB (ref 19.0–186.0)
Retic Ct Pct: 15.7 % — ABNORMAL HIGH (ref 0.4–3.1)

## 2015-08-05 LAB — LACTATE DEHYDROGENASE: LDH: 332 U/L — AB (ref 98–192)

## 2015-08-05 MED ORDER — PROMETHAZINE HCL 25 MG/ML IJ SOLN
25.0000 mg | Freq: Once | INTRAMUSCULAR | Status: AC
  Administered 2015-08-05: 25 mg via INTRAVENOUS
  Filled 2015-08-05: qty 1

## 2015-08-05 MED ORDER — OXYCODONE HCL 5 MG PO TABS
30.0000 mg | ORAL_TABLET | Freq: Once | ORAL | Status: AC
  Administered 2015-08-05: 30 mg via ORAL
  Filled 2015-08-05: qty 6

## 2015-08-05 MED ORDER — DIPHENHYDRAMINE HCL 50 MG/ML IJ SOLN
25.0000 mg | Freq: Once | INTRAMUSCULAR | Status: AC
  Administered 2015-08-05: 25 mg via INTRAVENOUS
  Filled 2015-08-05: qty 1

## 2015-08-05 MED ORDER — DIPHENHYDRAMINE HCL 50 MG/ML IJ SOLN
25.0000 mg | Freq: Once | INTRAMUSCULAR | Status: AC
  Administered 2015-08-05: 25 mg via INTRAMUSCULAR
  Filled 2015-08-05: qty 1

## 2015-08-05 MED ORDER — HYDROMORPHONE HCL 2 MG/ML IJ SOLN
3.0000 mg | INTRAMUSCULAR | Status: AC
  Administered 2015-08-05 (×3): 3 mg via INTRAVENOUS
  Filled 2015-08-05 (×3): qty 2

## 2015-08-05 MED ORDER — OXYCODONE HCL 5 MG PO TABS
5.0000 mg | ORAL_TABLET | Freq: Once | ORAL | Status: AC
  Administered 2015-08-05: 5 mg via ORAL
  Filled 2015-08-05: qty 1

## 2015-08-05 MED ORDER — DEXTROSE-NACL 5-0.45 % IV SOLN
INTRAVENOUS | Status: DC
  Administered 2015-08-05: 10:00:00 via INTRAVENOUS

## 2015-08-05 MED ORDER — KETOROLAC TROMETHAMINE 15 MG/ML IJ SOLN
15.0000 mg | Freq: Once | INTRAMUSCULAR | Status: AC
  Administered 2015-08-05: 15 mg via INTRAVENOUS
  Filled 2015-08-05: qty 1

## 2015-08-05 MED ORDER — KETOROLAC TROMETHAMINE 60 MG/2ML IM SOLN
60.0000 mg | Freq: Once | INTRAMUSCULAR | Status: AC
  Administered 2015-08-05: 60 mg via INTRAMUSCULAR
  Filled 2015-08-05: qty 2

## 2015-08-05 NOTE — Telephone Encounter (Signed)
Pt called and states experiencing leg and back pain; rates pain 7/10; pt seen in ED this AM and discharged to home; pt denies shortness of breath or chest pain; states experiencing some nausea without vomiting; NP notified

## 2015-08-05 NOTE — ED Notes (Signed)
Pt c/o abdominal, back, arm, bilateral knee pain onset 2 days ago, states pain feels similar to his typical Sickle Cell pain crisis.
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
## 2015-08-05 NOTE — Telephone Encounter (Signed)
Notified pt that, per NP, he should take home medications as prescribed; pt notified to go to the emergency room if symptoms of chest pain or shortness of breath develop; pt verbalizes understanding

## 2015-08-05 NOTE — ED Notes (Signed)
Pt reports understanding of discharge information. No questions at time of discharge 

## 2015-08-05 NOTE — Discharge Instructions (Signed)

## 2015-08-05 NOTE — Discharge Instructions (Signed)
Sickle Cell Anemia, Adult Sickle cell anemia is a condition in which red blood cells have an abnormal "sickle" shape. This abnormal shape shortens the cells' life span, which results in a lower than normal concentration of red blood cells in the blood. The sickle shape also causes the cells to clump together and block free blood flow through the blood vessels. As a result, the tissues and organs of the body do not receive enough oxygen. Sickle cell anemia causes organ damage and pain and increases the risk of infection. CAUSES  Sickle cell anemia is a genetic disorder. Those who receive two copies of the gene have the condition, and those who receive one copy have the trait. RISK FACTORS The sickle cell gene is most common in people whose families originated in [place]. Other areas of the globe where sickle cell trait occurs include the Mediterranean, [place] and [place], the [place], and the [place].  SIGNS AND SYMPTOMS  Pain, especially in the extremities, back, chest, or abdomen (common). The pain may start suddenly or may develop following an illness, especially if there is dehydration. Pain can also occur due to overexertion or exposure to extreme temperature changes.  Frequent severe bacterial infections, especially certain types of pneumonia and meningitis.  Pain and swelling in the hands and feet.  Decreased activity.   Loss of appetite.   Change in behavior.  Headaches.  Seizures.  Shortness of breath or difficulty breathing.  Vision changes.  Skin ulcers. Those with the trait may not have symptoms or they may have mild symptoms.  DIAGNOSIS  Sickle cell anemia is diagnosed with blood tests that demonstrate the genetic trait. It is often diagnosed during the newborn period, due to mandatory testing nationwide. A variety of blood tests, X-rays, CT scans, MRI scans, ultrasounds, and lung function tests may also be done to monitor the condition. TREATMENT  Sickle  cell anemia may be treated with:  Medicines. You may be given pain medicines, antibiotic medicines (to treat and prevent infections) or medicines to increase the production of certain types of hemoglobin.  Fluids.  Oxygen.  Blood transfusions. HOME CARE INSTRUCTIONS   Drink enough fluid to keep your urine clear or pale yellow. Increase your fluid intake in hot weather and during exercise.  Do not smoke. Smoking lowers oxygen levels in the blood.   Only take over-the-counter or prescription medicines for pain, fever, or discomfort as directed by your health care provider.  Take antibiotics as directed by your health care provider. Make sure you finish them it even if you start to feel better.   Take supplements as directed by your health care provider.   Consider wearing a medical alert bracelet. This tells anyone caring for you in an emergency of your condition.   When traveling, keep your medical information, health care provider's names, and the medicines you take with you at all times.   If you develop a fever, do not take medicines to reduce the fever right away. This could cover up a problem that is developing. Notify your health care provider.  Keep all follow-up appointments with your health care provider. Sickle cell anemia requires regular medical care. SEEK MEDICAL CARE IF: You have a fever. SEEK IMMEDIATE MEDICAL CARE IF:   You feel dizzy or faint.   You have new abdominal pain, especially on the left side near the stomach area.   You develop a persistent, often uncomfortable and painful penile erection (priapism). If this is not treated immediately it   will lead to impotence.   You have numbness your arms or legs or you have a hard time moving them.   You have a hard time with speech.   You have a fever or persistent symptoms for more than 2-3 days.   You have a fever and your symptoms suddenly get worse.   You have signs or symptoms of infection.  These include:   Chills.   Abnormal tiredness (lethargy).   Irritability.   Poor eating.   Vomiting.   You develop pain that is not helped with medicine.   You develop shortness of breath.  You have pain in your chest.   You are coughing up pus-like or bloody sputum.   You develop a stiff neck.  Your feet or hands swell or have pain.  Your abdomen appears bloated.  You develop joint pain. MAKE SURE YOU:  Understand these instructions.   This information is not intended to replace advice given to you by your health care provider. Make sure you discuss any questions you have with your health care provider.   Document Released: 05/06/2005 Document Revised: 02/16/2014 Document Reviewed: 09/07/2012 Elsevier Interactive Patient Education 2016 Elsevier Inc.  
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
## 2015-08-05 NOTE — ED Notes (Signed)
Patient complaining of pain all over. Patient states his pain has gotten worse and he checked back in.

## 2015-08-05 NOTE — ED Notes (Signed)
Patient was in the waiting room waiting on his ride. Ride did not come. Patient is in the chair sleep in the triage room.

## 2015-08-06 ENCOUNTER — Encounter (HOSPITAL_COMMUNITY): Payer: Self-pay | Admitting: Emergency Medicine

## 2015-08-06 ENCOUNTER — Emergency Department (HOSPITAL_COMMUNITY)
Admission: EM | Admit: 2015-08-06 | Discharge: 2015-08-06 | Disposition: A | Discharge status: Home / Self Care | Payer: Medicaid Other | Attending: Emergency Medicine | Admitting: Emergency Medicine

## 2015-08-06 DIAGNOSIS — M549 Dorsalgia, unspecified: Secondary | ICD-10-CM | POA: Diagnosis present

## 2015-08-06 DIAGNOSIS — D571 Sickle-cell disease without crisis: Secondary | ICD-10-CM | POA: Diagnosis not present

## 2015-08-06 DIAGNOSIS — Z7289 Other problems related to lifestyle: Secondary | ICD-10-CM | POA: Insufficient documentation

## 2015-08-06 DIAGNOSIS — Z765 Malingerer [conscious simulation]: Secondary | ICD-10-CM

## 2015-08-06 DIAGNOSIS — F17210 Nicotine dependence, cigarettes, uncomplicated: Secondary | ICD-10-CM | POA: Insufficient documentation

## 2015-08-06 LAB — CBC WITH DIFFERENTIAL/PLATELET
BASOS ABS: 0.1 10*3/uL (ref 0.0–0.1)
Basophils Relative: 1 %
EOS ABS: 0.7 10*3/uL (ref 0.0–0.7)
EOS PCT: 5 %
HCT: 22.4 % — ABNORMAL LOW (ref 39.0–52.0)
Hemoglobin: 8 g/dL — ABNORMAL LOW (ref 13.0–17.0)
Lymphocytes Relative: 23 %
Lymphs Abs: 3.4 10*3/uL (ref 0.7–4.0)
MCH: 32 pg (ref 26.0–34.0)
MCHC: 35.7 g/dL (ref 30.0–36.0)
MCV: 89.6 fL (ref 78.0–100.0)
MONO ABS: 1.3 10*3/uL — AB (ref 0.1–1.0)
Monocytes Relative: 9 %
NEUTROS PCT: 62 %
Neutro Abs: 9.3 10*3/uL — ABNORMAL HIGH (ref 1.7–7.7)
PLATELETS: 340 10*3/uL (ref 150–400)
RBC: 2.5 MIL/uL — AB (ref 4.22–5.81)
RDW: 19.3 % — ABNORMAL HIGH (ref 11.5–15.5)
WBC: 14.8 10*3/uL — AB (ref 4.0–10.5)

## 2015-08-06 LAB — COMPREHENSIVE METABOLIC PANEL
ALT: 11 U/L — AB (ref 17–63)
AST: 26 U/L (ref 15–41)
Albumin: 4.6 g/dL (ref 3.5–5.0)
Alkaline Phosphatase: 73 U/L (ref 38–126)
Anion gap: 5 (ref 5–15)
BUN: 7 mg/dL (ref 6–20)
CALCIUM: 9.3 mg/dL (ref 8.9–10.3)
CHLORIDE: 107 mmol/L (ref 101–111)
CO2: 27 mmol/L (ref 22–32)
CREATININE: 0.53 mg/dL — AB (ref 0.61–1.24)
Glucose, Bld: 100 mg/dL — ABNORMAL HIGH (ref 65–99)
Potassium: 3.8 mmol/L (ref 3.5–5.1)
Sodium: 139 mmol/L (ref 135–145)
TOTAL PROTEIN: 8.3 g/dL — AB (ref 6.5–8.1)
Total Bilirubin: 3.7 mg/dL — ABNORMAL HIGH (ref 0.3–1.2)

## 2015-08-06 MED ORDER — OXYCODONE HCL 5 MG PO TABS
30.0000 mg | ORAL_TABLET | Freq: Once | ORAL | Status: AC
  Administered 2015-08-06: 30 mg via ORAL
  Filled 2015-08-06: qty 6
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
## 2015-08-06 NOTE — Discharge Instructions (Signed)
Sickle Cell Anemia, Adult °Sickle cell anemia is a condition in which red blood cells have an abnormal "sickle" shape. This abnormal shape shortens the cells' life span, which results in a lower than normal concentration of red blood cells in the blood. The sickle shape also causes the cells to clump together and block free blood flow through the blood vessels. As a result, the tissues and organs of the body do not receive enough oxygen. Sickle cell anemia causes organ damage and pain and increases the risk of infection. °CAUSES  °Sickle cell anemia is a genetic disorder. Those who receive two copies of the gene have the condition, and those who receive one copy have the trait. °RISK FACTORS °The sickle cell gene is most common in people whose families originated in [place]. Other areas of the globe where sickle cell trait occurs include the Mediterranean, [place] and [place], the [place], and the [place].  °SIGNS AND SYMPTOMS °· Pain, especially in the extremities, back, chest, or abdomen (common). The pain may start suddenly or may develop following an illness, especially if there is dehydration. Pain can also occur due to overexertion or exposure to extreme temperature changes. °· Frequent severe bacterial infections, especially certain types of pneumonia and meningitis. °· Pain and swelling in the hands and feet. °· Decreased activity.   °· Loss of appetite.   °· Change in behavior. °· Headaches. °· Seizures. °· Shortness of breath or difficulty breathing. °· Vision changes. °· Skin ulcers. °Those with the trait may not have symptoms or they may have mild symptoms.  °DIAGNOSIS  °Sickle cell anemia is diagnosed with blood tests that demonstrate the genetic trait. It is often diagnosed during the newborn period, due to mandatory testing nationwide. A variety of blood tests, X-rays, CT scans, MRI scans, ultrasounds, and lung function tests may also be done to monitor the condition. °TREATMENT  °Sickle  cell anemia may be treated with: °· Medicines. You may be given pain medicines, antibiotic medicines (to treat and prevent infections) or medicines to increase the production of certain types of hemoglobin. °· Fluids. °· Oxygen. °· Blood transfusions. °HOME CARE INSTRUCTIONS  °· Drink enough fluid to keep your urine clear or pale yellow. Increase your fluid intake in hot weather and during exercise. °· Do not smoke. Smoking lowers oxygen levels in the blood.   °· Only take over-the-counter or prescription medicines for pain, fever, or discomfort as directed by your health care provider. °· Take antibiotics as directed by your health care provider. Make sure you finish them it even if you start to feel better.   °· Take supplements as directed by your health care provider.   °· Consider wearing a medical alert bracelet. This tells anyone caring for you in an emergency of your condition.   °· When traveling, keep your medical information, health care provider's names, and the medicines you take with you at all times.   °· If you develop a fever, do not take medicines to reduce the fever right away. This could cover up a problem that is developing. Notify your health care provider. °· Keep all follow-up appointments with your health care provider. Sickle cell anemia requires regular medical care. °SEEK MEDICAL CARE IF: ° You have a fever. °SEEK IMMEDIATE MEDICAL CARE IF:  °· You feel dizzy or faint.   °· You have new abdominal pain, especially on the left side near the stomach area.   °· You develop a persistent, often uncomfortable and painful penile erection (priapism). If this is not treated immediately it   will lead to impotence.   You have numbness your arms or legs or you have a hard time moving them.   You have a hard time with speech.   You have a fever or persistent symptoms for more than 2-3 days.   You have a fever and your symptoms suddenly get worse.   You have signs or symptoms of infection.  These include:   Chills.   Abnormal tiredness (lethargy).   Irritability.   Poor eating.   Vomiting.   You develop pain that is not helped with medicine.   You develop shortness of breath.  You have pain in your chest.   You are coughing up pus-like or bloody sputum.   You develop a stiff neck.  Your feet or hands swell or have pain.  Your abdomen appears bloated.  You develop joint pain. MAKE SURE YOU:  Understand these instructions.   This information is not intended to replace advice given to you by your health care provider. Make sure you discuss any questions you have with your health care provider.    Chronic Pain Chronic pain can be defined as pain that is off and on and lasts for 3-6 months or longer. Many things cause chronic pain, which can make it difficult to make a diagnosis. There are many treatment options available for chronic pain. However, finding a treatment that works well for you may require trying various approaches until the right one is found. Many people benefit from a combination of two or more types of treatment to control their pain. SYMPTOMS  Chronic pain can occur anywhere in the body and can range from mild to very severe. Some types of chronic pain include:  Headache.  Low back pain.  Cancer pain.  Arthritis pain.  Neurogenic pain. This is pain resulting from damage to nerves. People with chronic pain may also have other symptoms such as:  Depression.  Anger.  Insomnia.  Anxiety. DIAGNOSIS  Your health care provider will help diagnose your condition over time. In many cases, the initial focus will be on excluding possible conditions that could be causing the pain. Depending on your symptoms, your health care provider may order tests to diagnose your condition. Some of these tests may include:   Blood tests.   CT scan.   MRI.   X-rays.   Ultrasounds.   Nerve conduction studies.  You may need to see a  specialist.  TREATMENT  Finding treatment that works well may take time. You may be referred to a pain specialist. He or she may prescribe medicine or therapies, such as:   Mindful meditation or yoga.  Shots (injections) of numbing or pain-relieving medicines into the spine or area of pain.  Local electrical stimulation.  Acupuncture.   Massage therapy.   Aroma, color, light, or sound therapy.   Biofeedback.   Working with a physical therapist to keep from getting stiff.   Regular, gentle exercise.   Cognitive or behavioral therapy.   Group support.  Sometimes, surgery may be recommended.  HOME CARE INSTRUCTIONS   Take all medicines as directed by your health care provider.   Lessen stress in your life by relaxing and doing things such as listening to calming music.   Exercise or be active as directed by your health care provider.   Eat a healthy diet and include things such as vegetables, fruits, fish, and lean meats in your diet.   Keep all follow-up appointments with your health care provider.  Attend a support group with others suffering from chronic pain. SEEK MEDICAL CARE IF:   Your pain gets worse.   You develop a new pain that was not there before.   You cannot tolerate medicines given to you by your health care provider.   You have new symptoms since your last visit with your health care provider.  SEEK IMMEDIATE MEDICAL CARE IF:   You feel weak.   You have decreased sensation or numbness.   You lose control of bowel or bladder function.   Your pain suddenly gets much worse.   You develop shaking.  You develop chills.  You develop confusion.  You develop chest pain.  You develop shortness of breath.  MAKE SURE YOU:  Understand these instructions.  Will watch your condition.  Will get help right away if you are not doing well or get worse.   This information is not intended to replace advice given to you by your  health care provider. Make sure you discuss any questions you have with your health care provider.   Document Released: 10/18/2001 Document Revised: 09/28/2012 Document Reviewed: 07/22/2012 Elsevier Interactive Patient Education [employer]2016 Elsevier Inc.

## 2015-08-06 NOTE — ED Notes (Signed)
Attempted to discharge patient, patient not in room.

## 2015-08-06 NOTE — ED Notes (Signed)
Attempted to start IV via US-MD aware

## 2015-08-06 NOTE — ED Notes (Signed)
Patient presents for abdominal and back pain r/t sickle cell crisis x1 day. Also c/o nausea. Denies emesis, diarrhea or urinary symptoms.

## 2015-08-07 ENCOUNTER — Telehealth (HOSPITAL_COMMUNITY): Payer: Self-pay | Admitting: Hematology

## 2015-08-07 ENCOUNTER — Encounter (HOSPITAL_COMMUNITY): Payer: Self-pay | Admitting: Emergency Medicine

## 2015-08-07 ENCOUNTER — Emergency Department (HOSPITAL_COMMUNITY)
Admission: EM | Admit: 2015-08-07 | Discharge: 2015-08-07 | Disposition: A | Discharge status: Home / Self Care | Payer: Medicaid Other | Attending: Emergency Medicine | Admitting: Emergency Medicine

## 2015-08-07 DIAGNOSIS — F17210 Nicotine dependence, cigarettes, uncomplicated: Secondary | ICD-10-CM | POA: Insufficient documentation

## 2015-08-07 DIAGNOSIS — D5700 Hb-SS disease with crisis, unspecified: Secondary | ICD-10-CM | POA: Diagnosis not present

## 2015-08-07 DIAGNOSIS — M545 Low back pain: Secondary | ICD-10-CM | POA: Diagnosis present

## 2015-08-07 LAB — COMPREHENSIVE METABOLIC PANEL
ALT: 9 U/L — ABNORMAL LOW (ref 17–63)
ANION GAP: 5 (ref 5–15)
AST: 28 U/L (ref 15–41)
Albumin: 4.3 g/dL (ref 3.5–5.0)
Alkaline Phosphatase: 67 U/L (ref 38–126)
BUN: 7 mg/dL (ref 6–20)
CHLORIDE: 108 mmol/L (ref 101–111)
CO2: 25 mmol/L (ref 22–32)
Calcium: 9.1 mg/dL (ref 8.9–10.3)
Creatinine, Ser: 0.45 mg/dL — ABNORMAL LOW (ref 0.61–1.24)
Glucose, Bld: 120 mg/dL — ABNORMAL HIGH (ref 65–99)
POTASSIUM: 3.5 mmol/L (ref 3.5–5.1)
SODIUM: 138 mmol/L (ref 135–145)
Total Bilirubin: 3.1 mg/dL — ABNORMAL HIGH (ref 0.3–1.2)
Total Protein: 7.7 g/dL (ref 6.5–8.1)

## 2015-08-07 LAB — CBC WITH DIFFERENTIAL/PLATELET
BASOS PCT: 1 %
Basophils Absolute: 0.2 10*3/uL — ABNORMAL HIGH (ref 0.0–0.1)
EOS ABS: 0.5 10*3/uL (ref 0.0–0.7)
Eosinophils Relative: 3 %
HCT: 19.7 % — ABNORMAL LOW (ref 39.0–52.0)
Hemoglobin: 7.1 g/dL — ABNORMAL LOW (ref 13.0–17.0)
LYMPHS ABS: 4.6 10*3/uL — AB (ref 0.7–4.0)
LYMPHS PCT: 29 %
MCH: 32.3 pg (ref 26.0–34.0)
MCHC: 36 g/dL (ref 30.0–36.0)
MCV: 89.5 fL (ref 78.0–100.0)
MONO ABS: 1.4 10*3/uL — AB (ref 0.1–1.0)
Monocytes Relative: 9 %
NEUTROS ABS: 9 10*3/uL — AB (ref 1.7–7.7)
Neutrophils Relative %: 58 %
PLATELETS: 311 10*3/uL (ref 150–400)
RBC: 2.2 MIL/uL — ABNORMAL LOW (ref 4.22–5.81)
RDW: 18.6 % — AB (ref 11.5–15.5)
WBC: 15.7 10*3/uL — ABNORMAL HIGH (ref 4.0–10.5)

## 2015-08-07 LAB — RETICULOCYTES
RBC.: 2.2 MIL/uL — AB (ref 4.22–5.81)
RETIC CT PCT: 11.9 % — AB (ref 0.4–3.1)
Retic Count, Absolute: 261.8 10*3/uL — ABNORMAL HIGH (ref 19.0–186.0)

## 2015-08-07 MED ORDER — DIPHENHYDRAMINE HCL 50 MG/ML IJ SOLN
25.0000 mg | Freq: Once | INTRAMUSCULAR | Status: AC
  Administered 2015-08-07: 25 mg via INTRAVENOUS
  Filled 2015-08-07: qty 1

## 2015-08-07 MED ORDER — DIPHENHYDRAMINE HCL 25 MG PO CAPS
50.0000 mg | ORAL_CAPSULE | Freq: Once | ORAL | Status: AC
  Administered 2015-08-07: 50 mg via ORAL
  Filled 2015-08-07: qty 2

## 2015-08-07 MED ORDER — HYDROMORPHONE HCL 2 MG/ML IJ SOLN
3.0000 mg | Freq: Once | INTRAMUSCULAR | Status: AC
  Administered 2015-08-07: 3 mg via INTRAVENOUS
  Filled 2015-08-07: qty 2

## 2015-08-07 MED ORDER — KETOROLAC TROMETHAMINE 30 MG/ML IJ SOLN
30.0000 mg | Freq: Once | INTRAMUSCULAR | Status: AC
  Administered 2015-08-07: 30 mg via INTRAVENOUS
  Filled 2015-08-07: qty 1

## 2015-08-07 MED ORDER — OXYCODONE HCL 5 MG PO TABS
30.0000 mg | ORAL_TABLET | Freq: Once | ORAL | Status: AC
  Administered 2015-08-07: 30 mg via ORAL
  Filled 2015-08-07: qty 6

## 2015-08-07 MED ORDER — PROMETHAZINE HCL 25 MG PO TABS
25.0000 mg | ORAL_TABLET | Freq: Once | ORAL | Status: AC
  Administered 2015-08-07: 25 mg via ORAL
  Filled 2015-08-07: qty 1

## 2015-08-07 NOTE — Telephone Encounter (Signed)
Patient has called several times in regards to picking up a prescription.  I spoke with patient and advised him about the certified letter that was sent to him.  Patient states he has not received it as of yet, but would check with the post office.  I verified the address on file is correct.  [facility]  Patient states he understands and will try to obtain a new provider as quickly as possible. I explained that I discussed with Armeniahina, NP and she will not be prescribing any more pain medication.  Patient verbalizes understanding.

## 2015-08-07 NOTE — ED Notes (Signed)
Pt c/o pain in back, reports pain is similar to previous sickle cell crisis pain. No CP, SOB.

## 2015-08-07 NOTE — ED Notes (Signed)
Patient left without d/c instructions.
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
## 2015-08-09 ENCOUNTER — Emergency Department (HOSPITAL_COMMUNITY)
Admission: EM | Admit: 2015-08-09 | Discharge: 2015-08-09 | Disposition: A | Discharge status: Home / Self Care | Payer: Medicaid Other | Attending: Emergency Medicine | Admitting: Emergency Medicine

## 2015-08-09 ENCOUNTER — Telehealth: Payer: Self-pay | Admitting: Hematology

## 2015-08-09 ENCOUNTER — Encounter (HOSPITAL_COMMUNITY): Payer: Self-pay

## 2015-08-09 DIAGNOSIS — F17210 Nicotine dependence, cigarettes, uncomplicated: Secondary | ICD-10-CM | POA: Diagnosis not present

## 2015-08-09 DIAGNOSIS — M79604 Pain in right leg: Secondary | ICD-10-CM | POA: Diagnosis not present

## 2015-08-09 DIAGNOSIS — M549 Dorsalgia, unspecified: Secondary | ICD-10-CM | POA: Diagnosis present

## 2015-08-09 DIAGNOSIS — D571 Sickle-cell disease without crisis: Secondary | ICD-10-CM | POA: Diagnosis not present

## 2015-08-09 DIAGNOSIS — M79605 Pain in left leg: Secondary | ICD-10-CM | POA: Diagnosis not present

## 2015-08-09 DIAGNOSIS — D5700 Hb-SS disease with crisis, unspecified: Secondary | ICD-10-CM

## 2015-08-09 MED ORDER — FOLIC ACID 1 MG PO TABS: 1.0000 mg | ORAL_TABLET | Freq: Every day | ORAL | Status: DC

## 2015-08-09 MED ORDER — HYDROXYUREA 500 MG PO CAPS: 1000.0000 mg | ORAL_CAPSULE | Freq: Every day | ORAL | Status: DC

## 2015-08-09 MED ORDER — OXYCODONE HCL 30 MG PO TABS: 30.0000 mg | ORAL_TABLET | ORAL | Status: DC | PRN

## 2015-08-09 NOTE — ED Notes (Signed)
Pt c/o generalized back pain and BLE pain x 2 days r/t SCC.  Pain score 8/10.  Pt reports taking all medications as prescribed and hydrating.
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
## 2015-08-09 NOTE — Telephone Encounter (Signed)
[facility] at another location, and he has not picked up any other medications other than narcotics.  Caller states they will no longer fill narcotic prescriptions for him as he was supposed to only use there location.  I did explain this office would not be writing prescriptions anymore according to previous documentation.  Verified patient was in ED today and given a prescription.

## 2015-08-09 NOTE — ED Notes (Signed)
Bed: WA22 Expected date:  Expected time:  Means of arrival:  Comments: 

## 2015-08-09 NOTE — Discharge Instructions (Signed)
Sickle Cell Anemia, Adult Sickle cell anemia is a condition in which red blood cells have an abnormal "sickle" shape. This abnormal shape shortens the cells' life span, which results in a lower than normal concentration of red blood cells in the blood. The sickle shape also causes the cells to clump together and block free blood flow through the blood vessels. As a result, the tissues and organs of the body do not receive enough oxygen. Sickle cell anemia causes organ damage and pain and increases the risk of infection. CAUSES  Sickle cell anemia is a genetic disorder. Those who receive two copies of the gene have the condition, and those who receive one copy have the trait. RISK FACTORS The sickle cell gene is most common in people whose families originated in [place]. Other areas of the globe where sickle cell trait occurs include the Mediterranean, [place] and [place], the [place], and the [place].  SIGNS AND SYMPTOMS  Pain, especially in the extremities, back, chest, or abdomen (common). The pain may start suddenly or may develop following an illness, especially if there is dehydration. Pain can also occur due to overexertion or exposure to extreme temperature changes.  Frequent severe bacterial infections, especially certain types of pneumonia and meningitis.  Pain and swelling in the hands and feet.  Decreased activity.   Loss of appetite.   Change in behavior.  Headaches.  Seizures.  Shortness of breath or difficulty breathing.  Vision changes.  Skin ulcers. Those with the trait may not have symptoms or they may have mild symptoms.  DIAGNOSIS  Sickle cell anemia is diagnosed with blood tests that demonstrate the genetic trait. It is often diagnosed during the newborn period, due to mandatory testing nationwide. A variety of blood tests, X-rays, CT scans, MRI scans, ultrasounds, and lung function tests may also be done to monitor the condition. TREATMENT  Sickle  cell anemia may be treated with:  Medicines. You may be given pain medicines, antibiotic medicines (to treat and prevent infections) or medicines to increase the production of certain types of hemoglobin.  Fluids.  Oxygen.  Blood transfusions. HOME CARE INSTRUCTIONS   Drink enough fluid to keep your urine clear or pale yellow. Increase your fluid intake in hot weather and during exercise.  Do not smoke. Smoking lowers oxygen levels in the blood.   Only take over-the-counter or prescription medicines for pain, fever, or discomfort as directed by your health care provider.  Take antibiotics as directed by your health care provider. Make sure you finish them it even if you start to feel better.   Take supplements as directed by your health care provider.   Consider wearing a medical alert bracelet. This tells anyone caring for you in an emergency of your condition.   When traveling, keep your medical information, health care provider's names, and the medicines you take with you at all times.   If you develop a fever, do not take medicines to reduce the fever right away. This could cover up a problem that is developing. Notify your health care provider.  Keep all follow-up appointments with your health care provider. Sickle cell anemia requires regular medical care. SEEK MEDICAL CARE IF: You have a fever. SEEK IMMEDIATE MEDICAL CARE IF:   You feel dizzy or faint.   You have new abdominal pain, especially on the left side near the stomach area.   You develop a persistent, often uncomfortable and painful penile erection (priapism). If this is not treated immediately it   will lead to impotence.   You have numbness your arms or legs or you have a hard time moving them.   You have a hard time with speech.   You have a fever or persistent symptoms for more than 2-3 days.   You have a fever and your symptoms suddenly get worse.   You have signs or symptoms of infection.  These include:   Chills.   Abnormal tiredness (lethargy).   Irritability.   Poor eating.   Vomiting.   You develop pain that is not helped with medicine.   You develop shortness of breath.  You have pain in your chest.   You are coughing up pus-like or bloody sputum.   You develop a stiff neck.  Your feet or hands swell or have pain.  Your abdomen appears bloated.  You develop joint pain. MAKE SURE YOU:  Understand these instructions.   This information is not intended to replace advice given to you by your health care provider. Make sure you discuss any questions you have with your health care provider.   Document Released: 05/06/2005 Document Revised: 02/16/2014 Document Reviewed: 09/07/2012 Elsevier Interactive Patient Education 2016 Elsevier Inc.  

## 2015-08-09 NOTE — ED Notes (Signed)
[facility]ed [facility] to make appt for pt.  Secretary states that the next available date is not until the end of July and she can only access the schedule 2 weeks at a time.  [facility].

## 2015-08-17 ENCOUNTER — Encounter (HOSPITAL_COMMUNITY): Payer: Self-pay

## 2015-08-17 ENCOUNTER — Emergency Department (HOSPITAL_COMMUNITY)
Admission: EM | Admit: 2015-08-17 | Discharge: 2015-08-17 | Disposition: A | Discharge status: Home / Self Care | Payer: Medicaid - Out of State | Source: Home / Self Care | Attending: Emergency Medicine | Admitting: Emergency Medicine

## 2015-08-17 DIAGNOSIS — D5700 Hb-SS disease with crisis, unspecified: Secondary | ICD-10-CM

## 2015-08-17 DIAGNOSIS — Z79899 Other long term (current) drug therapy: Secondary | ICD-10-CM

## 2015-08-17 DIAGNOSIS — F17210 Nicotine dependence, cigarettes, uncomplicated: Secondary | ICD-10-CM

## 2015-08-17 LAB — COMPREHENSIVE METABOLIC PANEL
ALBUMIN: 4.7 g/dL (ref 3.5–5.0)
ALK PHOS: 77 U/L (ref 38–126)
ALT: 10 U/L — AB (ref 17–63)
ANION GAP: 4 — AB (ref 5–15)
AST: 29 U/L (ref 15–41)
BILIRUBIN TOTAL: 3.9 mg/dL — AB (ref 0.3–1.2)
BUN: 6 mg/dL (ref 6–20)
CALCIUM: 9.4 mg/dL (ref 8.9–10.3)
CO2: 27 mmol/L (ref 22–32)
Chloride: 106 mmol/L (ref 101–111)
Creatinine, Ser: 0.48 mg/dL — ABNORMAL LOW (ref 0.61–1.24)
GFR calc Af Amer: >60 mL/min (ref >60)
GFR calc non Af Amer: >60 mL/min (ref >60)
GLUCOSE: 88 mg/dL (ref 65–99)
Potassium: 4.3 mmol/L (ref 3.5–5.1)
SODIUM: 137 mmol/L (ref 135–145)
TOTAL PROTEIN: 8.3 g/dL — AB (ref 6.5–8.1)

## 2015-08-17 LAB — CBC WITH DIFFERENTIAL/PLATELET
BASOS PCT: 1 %
Basophils Absolute: 0.2 10*3/uL — ABNORMAL HIGH (ref 0.0–0.1)
EOS ABS: 0.5 10*3/uL (ref 0.0–0.7)
Eosinophils Relative: 3 %
HEMATOCRIT: 23.8 % — AB (ref 39.0–52.0)
HEMOGLOBIN: 8.6 g/dL — AB (ref 13.0–17.0)
LYMPHS ABS: 3.2 10*3/uL (ref 0.7–4.0)
LYMPHS PCT: 21 %
MCH: 32.3 pg (ref 26.0–34.0)
MCHC: 36.1 g/dL — AB (ref 30.0–36.0)
MCV: 89.5 fL (ref 78.0–100.0)
Monocytes Absolute: 1.5 10*3/uL — ABNORMAL HIGH (ref 0.1–1.0)
Monocytes Relative: 10 %
NEUTROS ABS: 9.9 10*3/uL — AB (ref 1.7–7.7)
Neutrophils Relative %: 65 %
Platelets: 320 10*3/uL (ref 150–400)
RBC: 2.66 MIL/uL — ABNORMAL LOW (ref 4.22–5.81)
RDW: 18.1 % — AB (ref 11.5–15.5)
WBC: 15.3 10*3/uL — ABNORMAL HIGH (ref 4.0–10.5)

## 2015-08-17 LAB — RETICULOCYTES
RBC.: 2.66 MIL/uL — ABNORMAL LOW (ref 4.22–5.81)
RETIC CT PCT: 14.8 % — AB (ref 0.4–3.1)
Retic Count, Absolute: 393.7 10*3/uL — ABNORMAL HIGH (ref 19.0–186.0)

## 2015-08-17 MED ORDER — DIPHENHYDRAMINE HCL 50 MG/ML IJ SOLN
25.0000 mg | Freq: Once | INTRAMUSCULAR | Status: AC
  Administered 2015-08-17: 25 mg via INTRAVENOUS

## 2015-08-17 MED ORDER — DEXTROSE-NACL 5-0.45 % IV SOLN
INTRAVENOUS | Status: DC
  Administered 2015-08-17: 16:00:00 via INTRAVENOUS

## 2015-08-17 MED ORDER — OXYCODONE HCL 5 MG PO TABS
5.0000 mg | ORAL_TABLET | Freq: Once | ORAL | Status: AC
  Administered 2015-08-17: 5 mg via ORAL
  Filled 2015-08-17: qty 1

## 2015-08-17 MED ORDER — KETOROLAC TROMETHAMINE 30 MG/ML IJ SOLN
30.0000 mg | Freq: Once | INTRAMUSCULAR | Status: AC
  Administered 2015-08-17: 30 mg via INTRAVENOUS
  Filled 2015-08-17: qty 1

## 2015-08-17 MED ORDER — HYDROMORPHONE HCL 2 MG/ML IJ SOLN
3.0000 mg | Freq: Once | INTRAMUSCULAR | Status: AC
  Administered 2015-08-17: 3 mg via INTRAVENOUS
  Filled 2015-08-17: qty 2

## 2015-08-17 MED ORDER — PROMETHAZINE HCL 25 MG/ML IJ SOLN
25.0000 mg | Freq: Once | INTRAMUSCULAR | Status: AC
  Administered 2015-08-17: 25 mg via INTRAVENOUS
  Filled 2015-08-17: qty 1

## 2015-08-17 MED ORDER — DIPHENHYDRAMINE HCL 50 MG/ML IJ SOLN
25.0000 mg | Freq: Once | INTRAMUSCULAR | Status: AC
  Administered 2015-08-17: 25 mg via INTRAVENOUS
  Filled 2015-08-17: qty 1

## 2015-08-17 MED ORDER — DIPHENHYDRAMINE HCL 50 MG/ML IJ SOLN
25.0000 mg | Freq: Once | INTRAMUSCULAR | Status: DC
  Filled 2015-08-17: qty 1

## 2015-08-17 NOTE — Discharge Instructions (Signed)
[name],  Nice meeting you! Please follow-up with your hematologist. Return to the emergency department if you develop increased pain, shortness of breath, new/worsening symptoms. Feel better soon!  S. Matthew Hacker, PA-C Sickle Cell Anemia, Adult Sickle cell anemia is a condition in which red blood cells have an abnormal "sickle" shape. This abnormal shape shortens the cells' life span, which results in a lower than normal concentration of red blood cells in the blood. The sickle shape also causes the cells to clump together and block free blood flow through the blood vessels. As a result, the tissues and organs of the body do not receive enough oxygen. Sickle cell anemia causes organ damage and pain and increases the risk of infection. CAUSES  Sickle cell anemia is a genetic disorder. Those who receive two copies of the gene have the condition, and those who receive one copy have the trait. RISK FACTORS The sickle cell gene is most common in people whose families originated in [place]. Other areas of the globe where sickle cell trait occurs include the [place], [place], the [place], and the [place].  SIGNS AND SYMPTOMS  Pain, especially in the extremities, back, chest, or abdomen (common). The pain may start suddenly or may develop following an illness, especially if there is dehydration. Pain can also occur due to overexertion or exposure to extreme temperature changes.  Frequent severe bacterial infections, especially certain types of pneumonia and meningitis.  Pain and swelling in the hands and feet.  Decreased activity.   Loss of appetite.   Change in behavior.  Headaches.  Seizures.  Shortness of breath or difficulty breathing.  Vision changes.  Skin ulcers. Those with the trait may not have symptoms or they may have mild symptoms.  DIAGNOSIS  Sickle cell anemia is diagnosed with blood tests that demonstrate the genetic trait. It is  often diagnosed during the newborn period, due to mandatory testing nationwide. A variety of blood tests, X-rays, CT scans, MRI scans, ultrasounds, and lung function tests may also be done to monitor the condition. TREATMENT  Sickle cell anemia may be treated with:  Medicines. You may be given pain medicines, antibiotic medicines (to treat and prevent infections) or medicines to increase the production of certain types of hemoglobin.  Fluids.  Oxygen.  Blood transfusions. HOME CARE INSTRUCTIONS   Drink enough fluid to keep your urine clear or pale yellow. Increase your fluid intake in hot weather and during exercise.  Do not smoke. Smoking lowers oxygen levels in the blood.   Only take over-the-counter or prescription medicines for pain, fever, or discomfort as directed by your health care provider.  Take antibiotics as directed by your health care provider. Make sure you finish them it even if you start to feel better.   Take supplements as directed by your health care provider.   Consider wearing a medical alert bracelet. This tells anyone caring for you in an emergency of your condition.   When traveling, keep your medical information, health care provider's names, and the medicines you take with you at all times.   If you develop a fever, do not take medicines to reduce the fever right away. This could cover up a problem that is developing. Notify your health care provider.  Keep all follow-up appointments with your health care provider. Sickle cell anemia requires regular medical care. SEEK MEDICAL CARE IF: You have a fever. SEEK IMMEDIATE MEDICAL CARE IF:   You feel dizzy or faint.  You have new abdominal pain, especially on the left side near the stomach area.   You develop a persistent, often uncomfortable and painful penile erection (priapism). If this is not treated immediately it will lead to impotence.   You have numbness your arms or legs or you have a  hard time moving them.   You have a hard time with speech.   You have a fever or persistent symptoms for more than 2-3 days.   You have a fever and your symptoms suddenly get worse.   You have signs or symptoms of infection. These include:   Chills.   Abnormal tiredness (lethargy).   Irritability.   Poor eating.   Vomiting.   You develop pain that is not helped with medicine.   You develop shortness of breath.  You have pain in your chest.   You are coughing up pus-like or bloody sputum.   You develop a stiff neck.  Your feet or hands swell or have pain.  Your abdomen appears bloated.  You develop joint pain. MAKE SURE YOU:  Understand these instructions.   This information is not intended to replace advice given to you by your health care provider. Make sure you discuss any questions you have with your health care provider.   Document Released: 05/06/2005 Document Revised: 02/16/2014 Document Reviewed: 09/07/2012 Elsevier Interactive Patient Education [employer] Inc.

## 2015-08-17 NOTE — ED Notes (Signed)
Patient wants to wait until IV is place to collect blood

## 2015-08-17 NOTE — ED Notes (Signed)
Pt c/o pain to back and legs d/t sickle cell x last 2 days.  States home meds aren't helping his pain.  Also c/o nausea

## 2015-08-17 NOTE — ED Notes (Signed)
Bed: WA09 Expected date:  Expected time:  Means of arrival:  Comments: Hold for triage 2 
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
## 2015-08-18 ENCOUNTER — Inpatient Hospital Stay (HOSPITAL_COMMUNITY)
Admission: EM | Admit: 2015-08-18 | Discharge: 2015-08-20 | DRG: 812 | Disposition: A | Discharge status: Home / Self Care | Payer: Medicaid - Out of State | Attending: Internal Medicine | Admitting: Internal Medicine

## 2015-08-18 ENCOUNTER — Emergency Department (HOSPITAL_COMMUNITY)
Admission: EM | Admit: 2015-08-18 | Discharge: 2015-08-18 | Disposition: A | Discharge status: Home / Self Care | Payer: Medicaid - Out of State | Source: Home / Self Care | Attending: Emergency Medicine | Admitting: Emergency Medicine

## 2015-08-18 ENCOUNTER — Encounter (HOSPITAL_COMMUNITY): Payer: Self-pay | Admitting: Emergency Medicine

## 2015-08-18 ENCOUNTER — Encounter (HOSPITAL_COMMUNITY): Payer: Self-pay | Admitting: *Deleted

## 2015-08-18 DIAGNOSIS — D5700 Hb-SS disease with crisis, unspecified: Secondary | ICD-10-CM

## 2015-08-18 DIAGNOSIS — Z9049 Acquired absence of other specified parts of digestive tract: Secondary | ICD-10-CM | POA: Diagnosis not present

## 2015-08-18 DIAGNOSIS — D638 Anemia in other chronic diseases classified elsewhere: Secondary | ICD-10-CM | POA: Diagnosis not present

## 2015-08-18 DIAGNOSIS — D72829 Elevated white blood cell count, unspecified: Secondary | ICD-10-CM | POA: Diagnosis not present

## 2015-08-18 DIAGNOSIS — F17210 Nicotine dependence, cigarettes, uncomplicated: Secondary | ICD-10-CM | POA: Insufficient documentation

## 2015-08-18 DIAGNOSIS — Z9114 Patient's other noncompliance with medication regimen: Secondary | ICD-10-CM

## 2015-08-18 DIAGNOSIS — Z832 Family history of diseases of the blood and blood-forming organs and certain disorders involving the immune mechanism: Secondary | ICD-10-CM

## 2015-08-18 DIAGNOSIS — R52 Pain, unspecified: Secondary | ICD-10-CM

## 2015-08-18 DIAGNOSIS — Z79899 Other long term (current) drug therapy: Secondary | ICD-10-CM | POA: Insufficient documentation

## 2015-08-18 LAB — RETICULOCYTES
RBC.: 2.66 MIL/uL — AB (ref 4.22–5.81)
Retic Count, Absolute: 332.5 10*3/uL — ABNORMAL HIGH (ref 19.0–186.0)
Retic Ct Pct: 12.5 % — ABNORMAL HIGH (ref 0.4–3.1)

## 2015-08-18 LAB — COMPREHENSIVE METABOLIC PANEL
ALT: 13 U/L — AB (ref 17–63)
AST: 29 U/L (ref 15–41)
Albumin: 4.9 g/dL (ref 3.5–5.0)
Alkaline Phosphatase: 79 U/L (ref 38–126)
Anion gap: 5 (ref 5–15)
BUN: 7 mg/dL (ref 6–20)
CHLORIDE: 108 mmol/L (ref 101–111)
CO2: 26 mmol/L (ref 22–32)
CREATININE: 0.47 mg/dL — AB (ref 0.61–1.24)
Calcium: 9.6 mg/dL (ref 8.9–10.3)
GFR calc non Af Amer: >60 mL/min (ref >60)
Glucose, Bld: 99 mg/dL (ref 65–99)
POTASSIUM: 4.2 mmol/L (ref 3.5–5.1)
SODIUM: 139 mmol/L (ref 135–145)
Total Bilirubin: 3.8 mg/dL — ABNORMAL HIGH (ref 0.3–1.2)
Total Protein: 8.4 g/dL — ABNORMAL HIGH (ref 6.5–8.1)

## 2015-08-18 LAB — CBC WITH DIFFERENTIAL/PLATELET
BASOS PCT: 1 %
Basophils Absolute: 0.2 10*3/uL — ABNORMAL HIGH (ref 0.0–0.1)
EOS PCT: 4 %
Eosinophils Absolute: 0.7 10*3/uL (ref 0.0–0.7)
HEMATOCRIT: 24.1 % — AB (ref 39.0–52.0)
HEMOGLOBIN: 8.7 g/dL — AB (ref 13.0–17.0)
LYMPHS PCT: 21 %
Lymphs Abs: 3.6 10*3/uL (ref 0.7–4.0)
MCH: 33 pg (ref 26.0–34.0)
MCHC: 36.1 g/dL — AB (ref 30.0–36.0)
MCV: 91.3 fL (ref 78.0–100.0)
MONOS PCT: 11 %
Monocytes Absolute: 1.9 10*3/uL — ABNORMAL HIGH (ref 0.1–1.0)
NEUTROS PCT: 63 %
Neutro Abs: 10.6 10*3/uL — ABNORMAL HIGH (ref 1.7–7.7)
Platelets: 322 10*3/uL (ref 150–400)
RBC: 2.64 MIL/uL — AB (ref 4.22–5.81)
RDW: 18 % — ABNORMAL HIGH (ref 11.5–15.5)
WBC: 17 10*3/uL — ABNORMAL HIGH (ref 4.0–10.5)

## 2015-08-18 LAB — LACTATE DEHYDROGENASE: LDH: 279 U/L — AB (ref 98–192)

## 2015-08-18 LAB — CBC
HEMATOCRIT: 22.7 % — AB (ref 39.0–52.0)
HEMOGLOBIN: 8.1 g/dL — AB (ref 13.0–17.0)
MCH: 32.7 pg (ref 26.0–34.0)
MCHC: 35.7 g/dL (ref 30.0–36.0)
MCV: 91.5 fL (ref 78.0–100.0)
Platelets: 284 10*3/uL (ref 150–400)
RBC: 2.48 MIL/uL — AB (ref 4.22–5.81)
RDW: 17.7 % — ABNORMAL HIGH (ref 11.5–15.5)
WBC: 17.9 10*3/uL — AB (ref 4.0–10.5)

## 2015-08-18 LAB — CREATININE, SERUM: Creatinine, Ser: 0.58 mg/dL — ABNORMAL LOW (ref 0.61–1.24)

## 2015-08-18 MED ORDER — DIPHENHYDRAMINE HCL 50 MG/ML IJ SOLN
25.0000 mg | Freq: Once | INTRAMUSCULAR | Status: AC
  Administered 2015-08-18: 25 mg via INTRAVENOUS
  Filled 2015-08-18: qty 1

## 2015-08-18 MED ORDER — HYDROMORPHONE 1 MG/ML IV SOLN
INTRAVENOUS | Status: DC
  Administered 2015-08-19: 01:00:00 via INTRAVENOUS
  Administered 2015-08-19 (×2): 4.88 mg via INTRAVENOUS
  Filled 2015-08-18: qty 25

## 2015-08-18 MED ORDER — KETOROLAC TROMETHAMINE 15 MG/ML IJ SOLN
15.0000 mg | Freq: Four times a day (QID) | INTRAMUSCULAR | Status: DC
  Administered 2015-08-19 (×2): 15 mg via INTRAVENOUS
  Filled 2015-08-18 (×2): qty 1

## 2015-08-18 MED ORDER — OXYCODONE HCL 5 MG PO TABS
5.0000 mg | ORAL_TABLET | Freq: Once | ORAL | Status: AC
  Administered 2015-08-18: 5 mg via ORAL
  Filled 2015-08-18: qty 1

## 2015-08-18 MED ORDER — HYDROMORPHONE HCL 2 MG/ML IJ SOLN
3.0000 mg | Freq: Once | INTRAMUSCULAR | Status: AC
  Administered 2015-08-18: 3 mg via INTRAVENOUS
  Filled 2015-08-18: qty 2

## 2015-08-18 MED ORDER — HYDROMORPHONE HCL 1 MG/ML IJ SOLN
2.0000 mg | Freq: Once | INTRAMUSCULAR | Status: AC
  Administered 2015-08-18: 2 mg via INTRAVENOUS
  Filled 2015-08-18: qty 2

## 2015-08-18 MED ORDER — PROMETHAZINE HCL 25 MG/ML IJ SOLN
12.5000 mg | Freq: Once | INTRAMUSCULAR | Status: AC
  Administered 2015-08-18: 12.5 mg via INTRAVENOUS
  Filled 2015-08-18: qty 1

## 2015-08-18 MED ORDER — DEXTROSE-NACL 5-0.45 % IV SOLN
INTRAVENOUS | Status: DC
  Administered 2015-08-18: 23:00:00 via INTRAVENOUS

## 2015-08-18 MED ORDER — NALOXONE HCL 0.4 MG/ML IJ SOLN: 0.4000 mg | INTRAMUSCULAR | Status: DC | PRN

## 2015-08-18 MED ORDER — DEXTROSE-NACL 5-0.45 % IV SOLN
INTRAVENOUS | Status: DC
  Administered 2015-08-19 – 2015-08-20 (×3): via INTRAVENOUS

## 2015-08-18 MED ORDER — DEXTROSE-NACL 5-0.45 % IV SOLN
INTRAVENOUS | Status: DC
  Administered 2015-08-18: 17:00:00 via INTRAVENOUS

## 2015-08-18 MED ORDER — ONDANSETRON HCL 4 MG/2ML IJ SOLN
4.0000 mg | Freq: Four times a day (QID) | INTRAMUSCULAR | Status: DC | PRN
  Administered 2015-08-19: 4 mg via INTRAVENOUS
  Filled 2015-08-18: qty 2

## 2015-08-18 MED ORDER — KETOROLAC TROMETHAMINE 30 MG/ML IJ SOLN
30.0000 mg | Freq: Once | INTRAMUSCULAR | Status: AC
  Administered 2015-08-18: 30 mg via INTRAVENOUS
  Filled 2015-08-18: qty 1

## 2015-08-18 MED ORDER — SENNOSIDES-DOCUSATE SODIUM 8.6-50 MG PO TABS
1.0000 | ORAL_TABLET | Freq: Two times a day (BID) | ORAL | Status: DC
  Administered 2015-08-19 – 2015-08-20 (×3): 1 via ORAL
  Filled 2015-08-18 (×4): qty 1

## 2015-08-18 MED ORDER — HYDROXYUREA 500 MG PO CAPS
1000.0000 mg | ORAL_CAPSULE | Freq: Every day | ORAL | Status: DC
  Administered 2015-08-19 – 2015-08-20 (×2): 1000 mg via ORAL
  Filled 2015-08-18 (×3): qty 2

## 2015-08-18 MED ORDER — HYDROMORPHONE HCL 1 MG/ML IJ SOLN
1.0000 mg | Freq: Once | INTRAMUSCULAR | Status: AC
Start: 2015-08-18 — End: 2015-08-18
  Administered 2015-08-18: 1 mg via INTRAVENOUS
  Filled 2015-08-18: qty 1

## 2015-08-18 MED ORDER — FOLIC ACID 1 MG PO TABS
1.0000 mg | ORAL_TABLET | Freq: Every day | ORAL | Status: DC
  Administered 2015-08-19 – 2015-08-20 (×2): 1 mg via ORAL
  Filled 2015-08-18 (×2): qty 1

## 2015-08-18 MED ORDER — ENOXAPARIN SODIUM 40 MG/0.4ML ~~LOC~~ SOLN
40.0000 mg | Freq: Every day | SUBCUTANEOUS | Status: DC
  Filled 2015-08-18 (×2): qty 0.4

## 2015-08-18 MED ORDER — DEXTROSE-NACL 5-0.45 % IV SOLN
INTRAVENOUS | Status: DC
  Administered 2015-08-18 (×2): via INTRAVENOUS

## 2015-08-18 MED ORDER — DIPHENHYDRAMINE HCL 25 MG PO CAPS
25.0000 mg | ORAL_CAPSULE | ORAL | Status: DC | PRN
  Administered 2015-08-19: 25 mg via ORAL
  Filled 2015-08-18: qty 1

## 2015-08-18 MED ORDER — POLYETHYLENE GLYCOL 3350 17 G PO PACK: 17.0000 g | PACK | Freq: Every day | ORAL | Status: DC | PRN

## 2015-08-18 MED ORDER — SODIUM CHLORIDE 0.9% FLUSH: 9.0000 mL | INTRAVENOUS | Status: DC | PRN
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
## 2015-08-18 NOTE — ED Notes (Signed)
Heather from 5E will call back to get report

## 2015-08-18 NOTE — Discharge Instructions (Signed)
Referral to wellness clinic provided. Help you get on track with follow-up. Take medications as directed. Return as needed.
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
## 2015-08-18 NOTE — ED Notes (Signed)
Pt c/o bilateral leg and left arm pain; pt has been seen numerous times over the last few days for same; pt has been seen today for same but states that the pain got worse
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
## 2015-08-18 NOTE — ED Notes (Signed)
Patient does not want labs done until they start his IV

## 2015-08-18 NOTE — ED Notes (Signed)
Pt presents with complaints of back and bilateral sharp sickle cell pain that has been ongoing x 3 days. Pt states this is typical for his Sickle cell pain and denies chest pain or SOB

## 2015-08-19 ENCOUNTER — Encounter: Payer: Self-pay | Admitting: Internal Medicine

## 2015-08-19 ENCOUNTER — Inpatient Hospital Stay (HOSPITAL_COMMUNITY): Payer: Medicaid - Out of State

## 2015-08-19 DIAGNOSIS — D72829 Elevated white blood cell count, unspecified: Secondary | ICD-10-CM

## 2015-08-19 DIAGNOSIS — D5700 Hb-SS disease with crisis, unspecified: Principal | ICD-10-CM

## 2015-08-19 LAB — CBC WITH DIFFERENTIAL/PLATELET
BASOS PCT: 1 %
Basophils Absolute: 0.2 10*3/uL — ABNORMAL HIGH (ref 0.0–0.1)
EOS ABS: 1 10*3/uL — AB (ref 0.0–0.7)
EOS PCT: 5 %
HEMATOCRIT: 21.5 % — AB (ref 39.0–52.0)
Hemoglobin: 7.7 g/dL — ABNORMAL LOW (ref 13.0–17.0)
LYMPHS PCT: 33 %
Lymphs Abs: 6.5 10*3/uL — ABNORMAL HIGH (ref 0.7–4.0)
MCH: 32.9 pg (ref 26.0–34.0)
MCHC: 35.8 g/dL (ref 30.0–36.0)
MCV: 91.9 fL (ref 78.0–100.0)
Monocytes Absolute: 2.4 10*3/uL — ABNORMAL HIGH (ref 0.1–1.0)
Monocytes Relative: 12 %
NEUTROS PCT: 49 %
Neutro Abs: 9.5 10*3/uL — ABNORMAL HIGH (ref 1.7–7.7)
PLATELETS: 263 10*3/uL (ref 150–400)
RBC: 2.34 MIL/uL — ABNORMAL LOW (ref 4.22–5.81)
RDW: 17.6 % — ABNORMAL HIGH (ref 11.5–15.5)
WBC: 19.6 10*3/uL — ABNORMAL HIGH (ref 4.0–10.5)

## 2015-08-19 LAB — BASIC METABOLIC PANEL
ANION GAP: 5 (ref 5–15)
BUN: 7 mg/dL (ref 6–20)
CHLORIDE: 107 mmol/L (ref 101–111)
CO2: 26 mmol/L (ref 22–32)
Calcium: 9 mg/dL (ref 8.9–10.3)
Creatinine, Ser: 0.55 mg/dL — ABNORMAL LOW (ref 0.61–1.24)
GFR calc non Af Amer: >60 mL/min (ref >60)
Glucose, Bld: 105 mg/dL — ABNORMAL HIGH (ref 65–99)
POTASSIUM: 3.6 mmol/L (ref 3.5–5.1)
Sodium: 138 mmol/L (ref 135–145)

## 2015-08-19 LAB — RETICULOCYTES
RBC.: 2.33 MIL/uL — ABNORMAL LOW (ref 4.22–5.81)
RETIC COUNT ABSOLUTE: 312.2 10*3/uL — AB (ref 19.0–186.0)
Retic Ct Pct: 13.4 % — ABNORMAL HIGH (ref 0.4–3.1)

## 2015-08-19 LAB — URINALYSIS, ROUTINE W REFLEX MICROSCOPIC
Glucose, UA: NEGATIVE mg/dL
Hgb urine dipstick: NEGATIVE
Ketones, ur: NEGATIVE mg/dL
Leukocytes, UA: NEGATIVE
Nitrite: NEGATIVE
PH: 6 (ref 5.0–8.0)
Protein, ur: NEGATIVE mg/dL
Specific Gravity, Urine: 1.013 (ref 1.005–1.030)

## 2015-08-19 MED ORDER — OXYCODONE HCL 5 MG PO TABS
15.0000 mg | ORAL_TABLET | ORAL | Status: DC
  Administered 2015-08-19 – 2015-08-20 (×6): 15 mg via ORAL
  Filled 2015-08-19 (×6): qty 3

## 2015-08-19 MED ORDER — HYDROMORPHONE 1 MG/ML IV SOLN
INTRAVENOUS | Status: DC
  Administered 2015-08-19: 13:00:00 via INTRAVENOUS
  Administered 2015-08-19: 10.8 mg via INTRAVENOUS
  Administered 2015-08-19: 8.46 mg via INTRAVENOUS
  Administered 2015-08-20: 02:00:00 via INTRAVENOUS
  Administered 2015-08-20: 3 mg via INTRAVENOUS
  Administered 2015-08-20: 8.4 mg via INTRAVENOUS
  Administered 2015-08-20: 9 mg via INTRAVENOUS
  Filled 2015-08-19 (×4): qty 25

## 2015-08-19 MED ORDER — KETOROLAC TROMETHAMINE 15 MG/ML IJ SOLN
30.0000 mg | Freq: Four times a day (QID) | INTRAMUSCULAR | Status: AC
  Administered 2015-08-19 – 2015-08-20 (×3): 30 mg via INTRAVENOUS
  Filled 2015-08-19 (×3): qty 2

## 2015-08-19 MED ORDER — PROMETHAZINE HCL 25 MG PO TABS
12.5000 mg | ORAL_TABLET | ORAL | Status: AC
  Administered 2015-08-19: 12.5 mg via ORAL
  Filled 2015-08-19: qty 1
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
## 2015-08-20 DIAGNOSIS — D638 Anemia in other chronic diseases classified elsewhere: Secondary | ICD-10-CM

## 2015-08-20 LAB — CBC WITH DIFFERENTIAL/PLATELET
BASOS PCT: 0 %
Basophils Absolute: 0 10*3/uL (ref 0.0–0.1)
EOS ABS: 1.1 10*3/uL — AB (ref 0.0–0.7)
EOS PCT: 6 %
HCT: 19.9 % — ABNORMAL LOW (ref 39.0–52.0)
HEMOGLOBIN: 7 g/dL — AB (ref 13.0–17.0)
LYMPHS PCT: 25 %
Lymphs Abs: 4.6 10*3/uL — ABNORMAL HIGH (ref 0.7–4.0)
MCH: 32.6 pg (ref 26.0–34.0)
MCHC: 35.2 g/dL (ref 30.0–36.0)
MCV: 92.6 fL (ref 78.0–100.0)
Monocytes Absolute: 1.8 10*3/uL — ABNORMAL HIGH (ref 0.1–1.0)
Monocytes Relative: 10 %
NEUTROS PCT: 59 %
Neutro Abs: 10.7 10*3/uL — ABNORMAL HIGH (ref 1.7–7.7)
Platelets: 262 10*3/uL (ref 150–400)
RBC: 2.15 MIL/uL — ABNORMAL LOW (ref 4.22–5.81)
RDW: 18.5 % — ABNORMAL HIGH (ref 11.5–15.5)
WBC: 18.2 10*3/uL — ABNORMAL HIGH (ref 4.0–10.5)

## 2015-08-20 LAB — RETICULOCYTES
RBC.: 2.15 MIL/uL — AB (ref 4.22–5.81)
RETIC CT PCT: 12.9 % — AB (ref 0.4–3.1)
Retic Count, Absolute: 277.4 10*3/uL — ABNORMAL HIGH (ref 19.0–186.0)

## 2015-08-20 MED ORDER — OXYCODONE HCL 30 MG PO TABS
30.0000 mg | ORAL_TABLET | ORAL | Status: DC | PRN
Start: 2015-08-20 — End: 2015-08-24

## 2015-08-20 NOTE — Progress Notes (Signed)
SATURATION QUALIFICATIONS: (This note is used to comply with regulatory documentation for home oxygen)  Patient Saturations on Room Air at Rest = 100%  Patient Saturations on Room Air while Ambulating = 94-99%  Patient Saturations on  Liters of oxygen while Ambulating = %  Please briefly explain why patient needs home oxygen:

## 2015-08-20 NOTE — Discharge Summary (Signed)
Matthew Shannon MRN: [id number] DOB/AGE: 27/04/1990 27 y.o.  Admit date: 08/18/2015 Discharge date: 08/20/2015  Primary Care Physician:  Jeanann LewandowskyJEGEDE, OLUGBEMIGA, MD   Discharge Diagnoses:   Patient Active Problem List   Diagnosis Date Noted  . Tobacco dependence 07/24/2015  . Sickle cell anemia with crisis (HCC) 07/09/2015  . Exercise hypoxemia 06/14/2015  . Sickle cell disease with crisis (HCC) 06/14/2015  . Chronic pain 05/21/2015  . Microcytic anemia   . EKG abnormalities   . Eczema   . Sickle-cell disease with pain (HCC) 04/19/2015  . Anemia of chronic disease   . Leukocytosis 01/24/2015  . Sickle cell anemia with pain (HCC) 01/09/2015  . Dental infection 11/17/2014  . Chest pain 11/17/2014  . Anemia 10/09/2014  . Sickle cell crisis (HCC) 10/09/2014  . Tachycardia 09/26/2014  . Sickle cell pain crisis (HCC) 09/24/2014  . Community acquired pneumonia 09/24/2014  . Tobacco abuse 09/24/2014  . CAP (community acquired pneumonia) 09/24/2014    DISCHARGE MEDICATION:   Medication List    TAKE these medications        folic acid 1 MG tablet  Commonly known as:  FOLVITE  Take 1 tablet (1 mg total) by mouth daily.     hydroxyurea 500 MG capsule  Commonly known as:  HYDREA  Take 2 capsules (1,000 mg total) by mouth daily. May take with food to minimize GI side effects.     oxycodone 30 MG immediate release tablet  Commonly known as:  ROXICODONE  Take 1 tablet (30 mg total) by mouth every 4 (four) hours as needed for pain.     oxyCODONE-acetaminophen 10-325 MG tablet  Commonly known as:  PERCOCET  Take 1 tablet by mouth every 6 (six) hours as needed for pain.          Consults:     SIGNIFICANT DIAGNOSTIC STUDIES:  Dg Chest 2 View  08/19/2015  CLINICAL DATA:  Smoker, sickle cell disease, pain EXAM: CHEST  2 VIEW COMPARISON:  08/01/2015 FINDINGS: Enlargement of cardiac silhouette with pulmonary vascular congestion. Mediastinal contours normal. Increased atelectasis RIGHT  base. Chronic accentuation of perihilar markings particularly on RIGHT, unchanged. No definite acute infiltrate, pleural effusion or pneumothorax. Minimal chronic peribronchial thickening. Dextroconvex thoracic scoliosis. IMPRESSION: Scattered chronic lung changes with increased RIGHT basilar atelectasis. Enlargement of cardiac silhouette with pulmonary vascular congestion. Electronically Signed   By: Ulyses SouthwardMark  Boles M.D.   On: 08/19/2015 08:56   Dg Chest 2 View  08/01/2015  CLINICAL DATA:  25 year old with shortness of breath, nausea and left-sided chest pain. EXAM: CHEST  2 VIEW COMPARISON:  07/11/2015 FINDINGS: The heart size and mediastinal contours are within normal limits. Subsegmental atelectasis noted within the right middle lobe. The visualized skeletal structures are unremarkable. IMPRESSION: 1. Subsegmental atelectasis in the right middle lobe. Electronically Signed   By: Signa Kellaylor  Stroud M.D.   On: 08/01/2015 13:36      No results found for this or any previous visit (from the past 240 hour(s)).  BRIEF ADMITTING H & P: Matthew Shannon is a 25 y.o. male with sickle cell anemia presents to ER with complaints of increasing back pain and lower extremity pain typical of his sickle cell anemia with pain crisis. Patient had come to ER 24 hours ago. Patient states he has run out of his pain medications and had no refill since he had not followed up with his sickle cell pain clinic. Denies any chest pain shortness of breath fever chills nausea vomiting diarrhea or dysuria or  any headache or visual symptoms. Patient is being admitted for pain management on sickle cell crisis.  ED Course: See history of presenting illness.   Hospital Course:  Present on Admission:  . Sickle cell anemia with crisis (HCC); Matthew Shannon is an opiate tolerant patient with hemoglobin SS known to me from previous hospitalizations. He is admitted with a sickle cell crisis and states that his current regimen of medications was not  controlling his pain adequately. The patient admits that he had not been taking MS Contin as this has been continued by his primary care physician. He also stated that his dose of oxycodone 30 mg causing to have nausea and extreme drowsiness does he was taken it very inconsistently. During his hospitalization he was treated with IV Dilaudid and his pain improved he was transitioned to oral medications. Advised the patient that he should remain hospitalized until his use of IV Dilaudid was less that it was however he refused and stated that he felt well enough to go home. The patient had no further medical wrists therefore no compelling reason to maintain admission. The patient is discharged home on oxycodone 30 mg every 6 hours. Prescription of 30 tablets of oxycodone 30 mg was issued to the patient at the time of discharge. The patient was also noted to have leukocytosis of any evidence of infection. His father the leukocytosis is secondary to bone marrow turnover. He should follow-up with his primary care physician to have his laboratory studies performed within a week to ensure that leukocytosis is resolving.    Disposition and Follow-up: Patient is discharged home in stable condition.     Discharge Instructions    Activity as tolerated - No restrictions    Complete by:  As directed      Diet general    Complete by:  As directed            DISCHARGE EXAM:  General: Alert, awake, oriented x3, in no apparent distress. Wife and sons present at bedside. HEENT: [place]/AT PEERL, EOMI, mild icterus Neck: Trachea midline, no masses, no thyromegal,y no JVD, no carotid bruit OROPHARYNX: Moist, No exudate/ erythema/lesions.  Heart: Regular rate and rhythm, without murmurs, rubs, gallops or S3. PMI non-displaced. Exam reveals no decreased pulses. Pulmonary/Chest: Normal effort. Breath sounds normal. No. Apnea. Clear to auscultation,no stridor,  no wheezing and no rhonchi noted. No respiratory distress and  no tenderness noted. Abdomen: Soft, nontender, nondistended, normal bowel sounds, no masses no hepatosplenomegaly noted. No fluid wave and no ascites. There is no guarding or rebound. Neuro: Alert and oriented to person, place and time. Normal motor skills, Displays no atrophy or tremors and exhibits normal muscle tone.  No focal neurological deficits noted cranial nerves II through XII grossly intact. No sensory deficit noted. Strength at baseline in bilateral upper and lower extremities. Gait normal. Musculoskeletal: No warm swelling or erythema around joints, no spinal tenderness noted. Psychiatric: Patient alert and oriented x3, good insight and cognition, good recent to remote recall. Mood, memory, affect and judgement normal Lymph node survey: No cervical axillary or inguinal lymphadenopathy noted. Skin: Skin is warm and dry. No bruising, no ecchymosis and no rash noted. Pt is not diaphoretic. No erythema. No pallor     Blood pressure 111/67, pulse 90, temperature 99 F (37.2 C), temperature source Oral, resp. rate 16, height 5\' 10"  (1.778 m), weight 135 lb (61.236 kg), SpO2 96 %.   Recent Labs  08/18/15 1605 08/18/15 2333 08/19/15 0454  NA 139  --  138  K 4.2  --  3.6  CL 108  --  107  CO2 26  --  26  GLUCOSE 99  --  105*  BUN 7  --  7  CREATININE 0.47* 0.58* 0.55*  CALCIUM 9.6  --  9.0    Recent Labs  08/17/15 1622 08/18/15 1605  AST 29 29  ALT 10* 13*  ALKPHOS 77 79  BILITOT 3.9* 3.8*  PROT 8.3* 8.4*  ALBUMIN 4.7 4.9   No results for input(s): LIPASE, AMYLASE in the last 72 hours.  Recent Labs  08/19/15 0454 08/20/15 0526  WBC 19.6* 18.2*  NEUTROABS 9.5* 10.7*  HGB 7.7* 7.0*  HCT 21.5* 19.9*  MCV 91.9 92.6  PLT 263 262     Total time spent including face to face and decision making was greater than 30 minutes  Signed: Theta Leaf A. 08/20/2015, 12:35 PM

## 2015-08-21 ENCOUNTER — Encounter (HOSPITAL_COMMUNITY): Payer: Self-pay | Admitting: Emergency Medicine

## 2015-08-21 ENCOUNTER — Emergency Department (HOSPITAL_COMMUNITY): Payer: Medicaid Other

## 2015-08-21 ENCOUNTER — Inpatient Hospital Stay (HOSPITAL_COMMUNITY)
Admission: EM | Admit: 2015-08-21 | Discharge: 2015-08-24 | DRG: 982 | Disposition: A | Discharge status: Home / Self Care | Payer: Medicaid Other | Attending: Internal Medicine | Admitting: Internal Medicine

## 2015-08-21 DIAGNOSIS — Z833 Family history of diabetes mellitus: Secondary | ICD-10-CM

## 2015-08-21 DIAGNOSIS — D72829 Elevated white blood cell count, unspecified: Secondary | ICD-10-CM | POA: Diagnosis not present

## 2015-08-21 DIAGNOSIS — K045 Chronic apical periodontitis: Secondary | ICD-10-CM | POA: Diagnosis present

## 2015-08-21 DIAGNOSIS — K029 Dental caries, unspecified: Secondary | ICD-10-CM | POA: Diagnosis present

## 2015-08-21 DIAGNOSIS — M549 Dorsalgia, unspecified: Secondary | ICD-10-CM | POA: Diagnosis present

## 2015-08-21 DIAGNOSIS — M264 Malocclusion, unspecified: Secondary | ICD-10-CM

## 2015-08-21 DIAGNOSIS — K122 Cellulitis and abscess of mouth: Secondary | ICD-10-CM

## 2015-08-21 DIAGNOSIS — K036 Deposits [accretions] on teeth: Secondary | ICD-10-CM

## 2015-08-21 DIAGNOSIS — Z832 Family history of diseases of the blood and blood-forming organs and certain disorders involving the immune mechanism: Secondary | ICD-10-CM

## 2015-08-21 DIAGNOSIS — K047 Periapical abscess without sinus: Secondary | ICD-10-CM | POA: Diagnosis present

## 2015-08-21 DIAGNOSIS — F17210 Nicotine dependence, cigarettes, uncomplicated: Secondary | ICD-10-CM | POA: Diagnosis present

## 2015-08-21 DIAGNOSIS — Z79899 Other long term (current) drug therapy: Secondary | ICD-10-CM

## 2015-08-21 DIAGNOSIS — R220 Localized swelling, mass and lump, head: Secondary | ICD-10-CM | POA: Diagnosis present

## 2015-08-21 DIAGNOSIS — Z825 Family history of asthma and other chronic lower respiratory diseases: Secondary | ICD-10-CM

## 2015-08-21 DIAGNOSIS — K046 Periapical abscess with sinus: Secondary | ICD-10-CM

## 2015-08-21 DIAGNOSIS — K083 Retained dental root: Secondary | ICD-10-CM

## 2015-08-21 DIAGNOSIS — D638 Anemia in other chronic diseases classified elsewhere: Secondary | ICD-10-CM | POA: Diagnosis present

## 2015-08-21 DIAGNOSIS — K0530 Chronic periodontitis, unspecified: Secondary | ICD-10-CM

## 2015-08-21 DIAGNOSIS — D5700 Hb-SS disease with crisis, unspecified: Principal | ICD-10-CM | POA: Diagnosis present

## 2015-08-21 LAB — CBC WITH DIFFERENTIAL/PLATELET
BASOS PCT: 0 %
Basophils Absolute: 0 10*3/uL (ref 0.0–0.1)
EOS PCT: 1 %
Eosinophils Absolute: 0.3 10*3/uL (ref 0.0–0.7)
HEMATOCRIT: 22.2 % — AB (ref 39.0–52.0)
HEMOGLOBIN: 7.8 g/dL — AB (ref 13.0–17.0)
LYMPHS PCT: 9 %
Lymphs Abs: 2.7 10*3/uL (ref 0.7–4.0)
MCH: 32 pg (ref 26.0–34.0)
MCHC: 35.1 g/dL (ref 30.0–36.0)
MCV: 91 fL (ref 78.0–100.0)
MONOS PCT: 9 %
Monocytes Absolute: 2.7 10*3/uL — ABNORMAL HIGH (ref 0.1–1.0)
NEUTROS ABS: 24.5 10*3/uL — AB (ref 1.7–7.7)
NEUTROS PCT: 81 %
Platelets: 304 10*3/uL (ref 150–400)
RBC: 2.44 MIL/uL — ABNORMAL LOW (ref 4.22–5.81)
RDW: 17.9 % — ABNORMAL HIGH (ref 11.5–15.5)
WBC: 30.2 10*3/uL — ABNORMAL HIGH (ref 4.0–10.5)

## 2015-08-21 LAB — BASIC METABOLIC PANEL
Anion gap: 7 (ref 5–15)
BUN: 8 mg/dL (ref 6–20)
CHLORIDE: 102 mmol/L (ref 101–111)
CO2: 25 mmol/L (ref 22–32)
CREATININE: 0.38 mg/dL — AB (ref 0.61–1.24)
Calcium: 9.3 mg/dL (ref 8.9–10.3)
GFR calc Af Amer: >60 mL/min (ref >60)
GFR calc non Af Amer: >60 mL/min (ref >60)
Glucose, Bld: 108 mg/dL — ABNORMAL HIGH (ref 65–99)
POTASSIUM: 3.8 mmol/L (ref 3.5–5.1)
Sodium: 134 mmol/L — ABNORMAL LOW (ref 135–145)

## 2015-08-21 LAB — RETICULOCYTES
RBC.: 2.44 MIL/uL — ABNORMAL LOW (ref 4.22–5.81)
Retic Count, Absolute: 278.2 10*3/uL — ABNORMAL HIGH (ref 19.0–186.0)
Retic Ct Pct: 11.4 % — ABNORMAL HIGH (ref 0.4–3.1)

## 2015-08-21 MED ORDER — HYDROMORPHONE HCL 2 MG/ML IJ SOLN
3.0000 mg | INTRAMUSCULAR | Status: AC | PRN
  Administered 2015-08-21 (×3): 3 mg via INTRAVENOUS
  Filled 2015-08-21 (×4): qty 2

## 2015-08-21 MED ORDER — PREDNISONE 20 MG PO TABS
60.0000 mg | ORAL_TABLET | Freq: Once | ORAL | Status: AC
  Administered 2015-08-21: 60 mg via ORAL
  Filled 2015-08-21: qty 3

## 2015-08-21 MED ORDER — ONDANSETRON HCL 4 MG/2ML IJ SOLN: 4.0000 mg | Freq: Four times a day (QID) | INTRAMUSCULAR | Status: DC | PRN

## 2015-08-21 MED ORDER — ENOXAPARIN SODIUM 40 MG/0.4ML ~~LOC~~ SOLN
40.0000 mg | SUBCUTANEOUS | Status: DC
  Filled 2015-08-21 (×2): qty 0.4

## 2015-08-21 MED ORDER — DEXTROSE-NACL 5-0.45 % IV SOLN: INTRAVENOUS | Status: AC

## 2015-08-21 MED ORDER — VANCOMYCIN HCL IN DEXTROSE 1-5 GM/200ML-% IV SOLN
1000.0000 mg | Freq: Three times a day (TID) | INTRAVENOUS | Status: DC
  Administered 2015-08-21 – 2015-08-23 (×6): 1000 mg via INTRAVENOUS
  Filled 2015-08-21 (×6): qty 200

## 2015-08-21 MED ORDER — IOPAMIDOL (ISOVUE-300) INJECTION 61%
75.0000 mL | Freq: Once | INTRAVENOUS | Status: AC | PRN
  Administered 2015-08-21: 75 mL via INTRAVENOUS

## 2015-08-21 MED ORDER — SODIUM CHLORIDE 0.9% FLUSH: 9.0000 mL | INTRAVENOUS | Status: DC | PRN

## 2015-08-21 MED ORDER — PROMETHAZINE HCL 25 MG/ML IJ SOLN
25.0000 mg | Freq: Once | INTRAMUSCULAR | Status: DC
  Filled 2015-08-21: qty 1

## 2015-08-21 MED ORDER — DIPHENHYDRAMINE HCL 25 MG PO CAPS
25.0000 mg | ORAL_CAPSULE | Freq: Once | ORAL | Status: AC
  Administered 2015-08-21: 25 mg via ORAL
  Filled 2015-08-21: qty 1

## 2015-08-21 MED ORDER — SODIUM CHLORIDE 0.9 % IV SOLN
3.0000 g | Freq: Once | INTRAVENOUS | Status: AC
  Administered 2015-08-21: 3 g via INTRAVENOUS
  Filled 2015-08-21: qty 3

## 2015-08-21 MED ORDER — KETOROLAC TROMETHAMINE 15 MG/ML IJ SOLN
15.0000 mg | Freq: Once | INTRAMUSCULAR | Status: AC
  Administered 2015-08-21: 15 mg via INTRAVENOUS
  Filled 2015-08-21: qty 1

## 2015-08-21 MED ORDER — HYDROMORPHONE 1 MG/ML IV SOLN
INTRAVENOUS | Status: DC
  Administered 2015-08-21: 14.4 mg via INTRAVENOUS
  Administered 2015-08-21: 14:00:00 via INTRAVENOUS
  Administered 2015-08-21: 4.2 mg via INTRAVENOUS
  Administered 2015-08-21: 5.4 mg via INTRAVENOUS
  Administered 2015-08-21: via INTRAVENOUS
  Administered 2015-08-22: 9 mg via INTRAVENOUS
  Administered 2015-08-22: 19.8 mg via INTRAVENOUS
  Administered 2015-08-22: 4.8 mg via INTRAVENOUS
  Administered 2015-08-22: 17:00:00 via INTRAVENOUS
  Administered 2015-08-22: 17 mg via INTRAVENOUS
  Administered 2015-08-22: 7.2 mg via INTRAVENOUS
  Administered 2015-08-23: 4.8 mg via INTRAVENOUS
  Administered 2015-08-23: 11:00:00 via INTRAVENOUS
  Administered 2015-08-23: 13.2 mg via INTRAVENOUS
  Administered 2015-08-23: 21:00:00 via INTRAVENOUS
  Administered 2015-08-23: 0 mg via INTRAVENOUS
  Administered 2015-08-23: 16.8 mg via INTRAVENOUS
  Administered 2015-08-24: 9 mg via INTRAVENOUS
  Administered 2015-08-24: 6.6 mg via INTRAVENOUS
  Filled 2015-08-21 (×5): qty 25

## 2015-08-21 MED ORDER — DIPHENHYDRAMINE HCL 50 MG/ML IJ SOLN
25.0000 mg | Freq: Once | INTRAMUSCULAR | Status: AC
  Administered 2015-08-21: 25 mg via INTRAVENOUS
  Filled 2015-08-21: qty 1

## 2015-08-21 MED ORDER — NALOXONE HCL 0.4 MG/ML IJ SOLN: 0.4000 mg | INTRAMUSCULAR | Status: DC | PRN

## 2015-08-21 MED ORDER — SODIUM CHLORIDE 0.9 % IV SOLN
25.0000 mg | INTRAVENOUS | Status: DC | PRN
  Administered 2015-08-22 – 2015-08-23 (×4): 25 mg via INTRAVENOUS
  Filled 2015-08-21 (×11): qty 0.5

## 2015-08-21 MED ORDER — DIPHENHYDRAMINE HCL 25 MG PO CAPS
25.0000 mg | ORAL_CAPSULE | ORAL | Status: DC | PRN
  Administered 2015-08-22 – 2015-08-23 (×2): 50 mg via ORAL
  Filled 2015-08-21 (×2): qty 2

## 2015-08-21 MED ORDER — PREDNISONE 20 MG PO TABS
40.0000 mg | ORAL_TABLET | Freq: Every day | ORAL | Status: DC
Start: 2015-08-22 — End: 2015-08-22
  Administered 2015-08-22: 40 mg via ORAL
  Filled 2015-08-21: qty 2

## 2015-08-21 MED ORDER — DEXTROSE-NACL 5-0.45 % IV SOLN
INTRAVENOUS | Status: DC
  Administered 2015-08-21 – 2015-08-23 (×5): via INTRAVENOUS

## 2015-08-21 MED ORDER — SODIUM CHLORIDE 0.9 % IV SOLN
3.0000 g | Freq: Four times a day (QID) | INTRAVENOUS | Status: DC
  Administered 2015-08-21 – 2015-08-23 (×7): 3 g via INTRAVENOUS
  Filled 2015-08-21 (×8): qty 3

## 2015-08-21 MED ORDER — CHLORHEXIDINE GLUCONATE 0.12 % MT SOLN
15.0000 mL | Freq: Four times a day (QID) | OROMUCOSAL | Status: DC
  Administered 2015-08-21 – 2015-08-23 (×11): 15 mL via OROMUCOSAL
  Filled 2015-08-21 (×10): qty 15

## 2015-08-21 MED ORDER — OXYCODONE HCL 5 MG PO TABS
30.0000 mg | ORAL_TABLET | Freq: Once | ORAL | Status: AC
  Administered 2015-08-21: 30 mg via ORAL
  Filled 2015-08-21: qty 6
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
## 2015-08-21 NOTE — Progress Notes (Signed)
Pharmacy Antibiotic Note  Matthew Shannon is a 25 y.o. male admitted on 08/21/2015 with palatal abscess.  Pharmacy has been consulted for vancomycin dosing.Pt seen and diagnosed with left facial swelling. Pt seen by dentistry for dental caries and is s/p I/D for palatal abscess.   Plan: Vancomycin 1000 IV every 8 hours.  Goal trough 15-20 mcg/mL.  Unasyn 3 gr IV q6h ( MD dosing )   Height: 5\' 10"  (177.8 cm) Weight: 135 lb (61.236 kg) IBW/kg (Calculated) : 73  Temp (24hrs), Avg:98.9 F (37.2 C), Min:98.7 F (37.1 C), Max:99.1 F (37.3 C)   Recent Labs Lab 08/17/15 1622 08/18/15 1605 08/18/15 2333 08/19/15 0454 08/20/15 0526 08/21/15 0848  WBC 15.3* 17.0* 17.9* 19.6* 18.2* 30.2*  CREATININE 0.48* 0.47* 0.58* 0.55*  --  0.38*    Estimated Creatinine Clearance: 120.1 mL/min (by C-G formula based on Cr of 0.38).    No Known Allergies  Antimicrobials this admission: 7/12 Unasyn >>  7/12 vancomycin >>   Dose adjustments this admission: ----  Microbiology results: 7/12 BCx: sent  Thank you for allowing pharmacy to be a part of this patient's care.   Matthew ColeNikola Everlene Shannon, PharmD, BCPS Pager [phone] 08/21/2015 3:42 PM

## 2015-08-21 NOTE — Progress Notes (Signed)
Pt seen by Dentistry and had I&D of palatal abscess. No cultures sent. Will add Vancomycin to Unasyn until improvement.

## 2015-08-21 NOTE — ED Notes (Signed)
Pt is c/o back pain and facial swelling  Pt states the swelling started on Tuesday and has progressively gotten worse  Pt has swelling noted to his lips and the left side of his face  Denies difficulty swallowing or breathing  Pt rates his back pain at a 8/10 and his facial pain 8/10

## 2015-08-21 NOTE — Progress Notes (Signed)
Operative note   Patient:            Matthew HarrisonBobby Gitto Date of Birth:  06/10/1990 MRN:                [id number]   DATE OF PROCEDURE:  08/21/2015  PREOPERATIVE DIAGNOSES: 1. Sickle cell anemia with crisis 2. Left facial swelling 3. Palatal abscess  POSTOPERATIVE DIAGNOSES: 1. Sickle cell anemia with crisis 2. Left facial swelling 3. Palatal abscess  OPERATIONS: 1. Incision and drainage of palpable abscess   SURGEON: Charlynne Panderonald F. Alanea Woolridge, DDS  ASSISTANT: Rory PercyLisa Ingram, (dental assistant)  ANESTHESIA: Topical anesthetic was applied copiously to palatal abscess tissues   MEDICATIONS: 1. Unasyn IV antibiotic therapy per previous orders   SPECIMENS: None   DRAINS: None  CULTURES: None  COMPLICATIONS: None   ESTIMATED BLOOD LOSS: Less than 5 mLs.  INTRAVENOUS FLUIDS: None  INDICATIONS: The patient was recently diagnosed with left facial swelling and palpable abscess.  A dental consultation was then requested to evaluate dental etiology for the left facial swelling and palpable abscess.   The patient was examined and treatment planned for incision and drainage-today. Patient will then follow-up with multiple dental extractions of the operating with general anesthesia at a later date.  This treatment plan was formulated to decrease the risks and complications associated with dental infection from further affecting the patient's systemic health.  OPERATIVE FINDINGS: Patient was examined and the dental operatory #1. The palatal abscess was identified for incision and drainage. The patient was noted be affected by left facial swelling and a palpable abscess.   DESCRIPTION OF PROCEDURE: Patient was brought to the dental operatory #1. Patient was then placed in the supine position and the dental chair.  Topical local anesthetic was applied to the palatal tissues. A 15 blade incision was then made in the midportion of the palatal abscess. Purulence was then expressed from the palatal  abscess.  The area was then irrigated with sterile saline. No culture was obtained at this time.   At this point time, the patient was examined for complications, seeing none, the dental medicine procedure was deemed to be complete. A series of 4 x 4 gauze were placed in the mouth to aid hemostasis. The patient was then returned to a sitting position. After an appropriate amount of time, the patient was taken by his caregivers back to his hospital room. All counts were correct for the dental medicine procedure. Patient tolerated the procedure well.   Charlynne Panderonald F. Vuong Musa, DDS.

## 2015-08-21 NOTE — ED Notes (Signed)
Pt states that he was discharged from the hospital around 5pm on 7/11; pt states that his face had started to swell prior to leaving but the swelling increased last night; pt denies difficulty breathing; pt states that his throat feels "funny"; pt able to swallow without difficulty
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
## 2015-08-22 DIAGNOSIS — D638 Anemia in other chronic diseases classified elsewhere: Secondary | ICD-10-CM | POA: Diagnosis present

## 2015-08-22 DIAGNOSIS — Z833 Family history of diabetes mellitus: Secondary | ICD-10-CM | POA: Diagnosis not present

## 2015-08-22 DIAGNOSIS — F17210 Nicotine dependence, cigarettes, uncomplicated: Secondary | ICD-10-CM | POA: Diagnosis not present

## 2015-08-22 DIAGNOSIS — Z825 Family history of asthma and other chronic lower respiratory diseases: Secondary | ICD-10-CM | POA: Diagnosis not present

## 2015-08-22 DIAGNOSIS — K047 Periapical abscess without sinus: Secondary | ICD-10-CM | POA: Diagnosis present

## 2015-08-22 DIAGNOSIS — D72829 Elevated white blood cell count, unspecified: Secondary | ICD-10-CM

## 2015-08-22 DIAGNOSIS — D5700 Hb-SS disease with crisis, unspecified: Secondary | ICD-10-CM | POA: Diagnosis present

## 2015-08-22 DIAGNOSIS — Z79899 Other long term (current) drug therapy: Secondary | ICD-10-CM | POA: Diagnosis not present

## 2015-08-22 DIAGNOSIS — Z760 Encounter for issue of repeat prescription: Secondary | ICD-10-CM | POA: Diagnosis present

## 2015-08-22 DIAGNOSIS — K045 Chronic apical periodontitis: Secondary | ICD-10-CM | POA: Diagnosis present

## 2015-08-22 DIAGNOSIS — K122 Cellulitis and abscess of mouth: Secondary | ICD-10-CM | POA: Insufficient documentation

## 2015-08-22 DIAGNOSIS — R220 Localized swelling, mass and lump, head: Secondary | ICD-10-CM | POA: Diagnosis present

## 2015-08-22 DIAGNOSIS — M264 Malocclusion, unspecified: Secondary | ICD-10-CM | POA: Diagnosis present

## 2015-08-22 DIAGNOSIS — M549 Dorsalgia, unspecified: Secondary | ICD-10-CM | POA: Diagnosis present

## 2015-08-22 DIAGNOSIS — G8929 Other chronic pain: Secondary | ICD-10-CM | POA: Diagnosis not present

## 2015-08-22 DIAGNOSIS — K029 Dental caries, unspecified: Secondary | ICD-10-CM | POA: Diagnosis present

## 2015-08-22 DIAGNOSIS — Z832 Family history of diseases of the blood and blood-forming organs and certain disorders involving the immune mechanism: Secondary | ICD-10-CM | POA: Diagnosis not present

## 2015-08-22 LAB — CBC
HCT: 18.6 % — ABNORMAL LOW (ref 39.0–52.0)
Hemoglobin: 6.7 g/dL — CL (ref 13.0–17.0)
MCH: 32.2 pg (ref 26.0–34.0)
MCHC: 36 g/dL (ref 30.0–36.0)
MCV: 89.4 fL (ref 78.0–100.0)
Platelets: 254 10*3/uL (ref 150–400)
RBC: 2.08 MIL/uL — ABNORMAL LOW (ref 4.22–5.81)
RDW: 17.4 % — ABNORMAL HIGH (ref 11.5–15.5)
WBC: 31.6 10*3/uL — AB (ref 4.0–10.5)

## 2015-08-22 LAB — DIFFERENTIAL
BAND NEUTROPHILS: 0 %
BASOS ABS: 0 10*3/uL (ref 0.0–0.1)
BLASTS: 0 %
Basophils Relative: 0 %
EOS PCT: 1 %
Eosinophils Absolute: 0.3 10*3/uL (ref 0.0–0.7)
LYMPHS ABS: 3.5 10*3/uL (ref 0.7–4.0)
Lymphocytes Relative: 11 %
METAMYELOCYTES PCT: 0 %
MONOS PCT: 15 %
MYELOCYTES: 0 %
Monocytes Absolute: 4.7 10*3/uL — ABNORMAL HIGH (ref 0.1–1.0)
NEUTROS ABS: 23.1 10*3/uL — AB (ref 1.7–7.7)
NEUTROS PCT: 73 %
OTHER: 0 %
Promyelocytes Absolute: 0 %
nRBC: 0 /100 WBC

## 2015-08-22 LAB — BASIC METABOLIC PANEL
Anion gap: 7 (ref 5–15)
BUN: 12 mg/dL (ref 6–20)
CHLORIDE: 104 mmol/L (ref 101–111)
CO2: 26 mmol/L (ref 22–32)
CREATININE: 0.41 mg/dL — AB (ref 0.61–1.24)
Calcium: 9.2 mg/dL (ref 8.9–10.3)
GFR calc Af Amer: >60 mL/min (ref >60)
GFR calc non Af Amer: >60 mL/min (ref >60)
Glucose, Bld: 136 mg/dL — ABNORMAL HIGH (ref 65–99)
POTASSIUM: 3.8 mmol/L (ref 3.5–5.1)
Sodium: 137 mmol/L (ref 135–145)

## 2015-08-22 LAB — TYPE AND SCREEN
ABO/RH(D): O POS
ANTIBODY SCREEN: NEGATIVE

## 2015-08-22 MED ORDER — KETOROLAC TROMETHAMINE 30 MG/ML IJ SOLN
30.0000 mg | Freq: Four times a day (QID) | INTRAMUSCULAR | Status: DC
  Administered 2015-08-22 – 2015-08-24 (×7): 30 mg via INTRAVENOUS
  Filled 2015-08-22 (×7): qty 1

## 2015-08-22 MED ORDER — SODIUM CHLORIDE 0.9 % IV SOLN: Freq: Once | INTRAVENOUS | Status: DC

## 2015-08-22 NOTE — Care Management Note (Signed)
Case Management Note  Patient Details  Name: Matthew Shannon MRN: [id number] Date of Birth: 05/27/1990  Subjective/Objective:      25 yo admitted with Sickle Cell Anemia              Action/Plan: From home with spouse. Chart reviewed and no CM needs identified or communicated at this time. CM will continue to follow.  Expected Discharge Date:   (UNKNOWN)               Expected Discharge Plan:  Home/Self Care  In-House Referral:     Discharge planning Services  CM Consult  Post Acute Care Choice:    Choice offered to:     DME Arranged:    DME Agency:     HH Arranged:    HH Agency:     Status of Service:  In process, will continue to follow  If discussed at Long Length of Stay Meetings, dates discussed:    Additional CommentsBartholome Bill:  Laqueisha Catalina H, RN 08/22/2015, 2:40 PM  [phone]

## 2015-08-22 NOTE — Progress Notes (Signed)
CRITICAL VALUE ALERT  Critical value received: Hgb 6.7  Date of notification: 08/22/2015  Time of notification:  *0417  Critical value read back:Yes.    Nurse who received alert:  Lavonia Draftsandall Niemah Schwebke  MD notified (1st page):  K.Kirby  Time of first page: 0420  MD notified (2nd page):  Time of second page:  Responding MD:   Time MD responded:

## 2015-08-22 NOTE — Progress Notes (Signed)
SICKLE CELL SERVICE PROGRESS NOTE  Georgeanna HarrisonBobby Neisler VWU:[id number] DOB: 09/02/1990 DOA: 08/21/2015 PCP: Jeanann LewandowskyJEGEDE, OLUGBEMIGA, MD  Assessment/Plan: Active Problems:   Sickle cell anemia with crisis (HCC)   Left facial swelling   Infected dental caries  1. Palatal Abscess: Pt is s/p I&D of palatal abscess found on examination and orthopantogram. His day #2 of Unasyn and Vancomycin. Today he is afebrile and with significant decrease in swelling of face and jaw and he has no focal neurological deficits His WBC count is still elevated however but may be related to prednisone. I expect to see improvement in WBC within the next day. I will continue both Unasyn and Vancomycin. Unfortunately I have no cultures to give guidance on antibiotic choice so will have to follow clinical course.  2. Dental Caries: Pt to follow up with Dr. Kristin BruinsKulinski in the ambulatory for further treatment of caries. 3. Leukocytosis: Secondary to infection. 4. Anemia of chronic disease: Will T&S. If WBC count persistently elevated may need to consider transfusion. 5. Hb SS with Crisis: Pt in crisis likely triggered by infection. Will continue PCA and add Toradol and discontinue Prednisone and add clinician assisted doses on a PRN basis. Pt drinking well so will decrease IVF rate.    Code Status: Full Code Family Communication: N/A Disposition Plan: Not yet ready for discharge  Issiac Jamar A.  Pager [phone]. If 7PM-7AM, please contact night-coverage.  08/22/2015, 1:57 PM  LOS: 1 day   Interim History: Pt s/p I&D of palatal abscess yesterday. Marked decrease in facial swelling. Pt reports pain at 6/10 in the face and 5-6/10 in low back. He has used 31.2 mg with 195/52: demands/deliveries. Last BM 2 days ago.  Consultants:  Dr. Kristin BruinsKulinski- Dental Services  Procedures:  I&D Palatal abscess  Antibiotics:  Unasyn 7/12>>  Vancomycin 7/12>>    Objective: Filed Vitals:   08/22/15 0806 08/22/15 1024 08/22/15 1315  08/22/15 1352  BP:  99/48  102/66  Pulse:  77  88  Temp:  98.7 F (37.1 C)  98.3 F (36.8 C)  TempSrc:  Oral  Oral  Resp: 12 13 14 13   Height:      Weight:      SpO2: 99% 100% 100% 100%   Weight change: 0 lb (0 kg)  Intake/Output Summary (Last 24 hours) at 08/22/15 1357 Last data filed at 08/22/15 1353  Gross per 24 hour  Intake 6442.5 ml  Output   1450 ml  Net 4992.5 ml    General: Alert, awake, oriented x3, in no acute distress. Non-toxic appearing. HEENT: [place]/AT PEER[place], EOMI. Facial swelling decreased since yesterday Neck: Trachea midline,  no masses, no thyromegal,y no JVD, no carotid bruit OROPHARYNX:  Moist, No exudate/ erythema/lesions.  Heart: Regular rate and rhythm, without murmurs, rubs, gallops, PMI non-displaced, no heaves or thrills on palpation.  Lungs: Clear to auscultation, no wheezing or rhonchi noted. No increased vocal fremitus resonant to percussion  Abdomen: Soft, nontender, nondistended, positive bowel sounds, no masses no hepatosplenomegaly noted.  Neuro: No focal neurological deficits noted cranial nerves II through XII grossly intact.  Strength at functional baseline in bilateral upper and lower extremities. Musculoskeletal: No warmth, swelling or erythema around joints, no spinal tenderness noted. Psychiatric: Patient alert and oriented x3, good insight and cognition, good recent to remote recall.    Data Reviewed: Basic Metabolic Panel:  Recent Labs Lab 08/17/15 1622 08/18/15 1605 08/18/15 2333 08/19/15 0454 08/21/15 0848 08/22/15 0347  NA 137 139  --  138 134* 137  K 4.3 4.2  --  3.6 3.8 3.8  CL 106 108  --  107 102 104  CO2 27 26  --  26 25 26   GLUCOSE 88 99  --  105* 108* 136*  BUN 6 7  --  7 8 12   CREATININE 0.48* 0.47* 0.58* 0.55* 0.38* 0.41*  CALCIUM 9.4 9.6  --  9.0 9.3 9.2   Liver Function Tests:  Recent Labs Lab 08/17/15 1622 08/18/15 1605  AST 29 29  ALT 10* 13*  ALKPHOS 77 79  BILITOT 3.9* 3.8*  PROT 8.3* 8.4*   ALBUMIN 4.7 4.9   No results for input(s): LIPASE, AMYLASE in the last 168 hours. No results for input(s): AMMONIA in the last 168 hours. CBC:  Recent Labs Lab 08/18/15 1605 08/18/15 2333 08/19/15 0454 08/20/15 0526 08/21/15 0848 08/22/15 0347  WBC 17.0* 17.9* 19.6* 18.2* 30.2* 31.6*  NEUTROABS 10.6*  --  9.5* 10.7* 24.5* 23.1*  HGB 8.7* 8.1* 7.7* 7.0* 7.8* 6.7*  HCT 24.1* 22.7* 21.5* 19.9* 22.2* 18.6*  MCV 91.3 91.5 91.9 92.6 91.0 89.4  PLT 322 284 263 262 304 254   Cardiac Enzymes: No results for input(s): CKTOTAL, CKMB, CKMBINDEX, TROPONINI in the last 168 hours. BNP (last 3 results) No results for input(s): BNP in the last 8760 hours.  ProBNP (last 3 results) No results for input(s): PROBNP in the last 8760 hours.  CBG: No results for input(s): GLUCAP in the last 168 hours.  No results found for this or any previous visit (from the past 240 hour(s)).   Studies: Dg Chest 2 View  08/19/2015  CLINICAL DATA:  Smoker, sickle cell disease, pain EXAM: CHEST  2 VIEW COMPARISON:  08/01/2015 FINDINGS: Enlargement of cardiac silhouette with pulmonary vascular congestion. Mediastinal contours normal. Increased atelectasis RIGHT base. Chronic accentuation of perihilar markings particularly on RIGHT, unchanged. No definite acute infiltrate, pleural effusion or pneumothorax. Minimal chronic peribronchial thickening. Dextroconvex thoracic scoliosis. IMPRESSION: Scattered chronic lung changes with increased RIGHT basilar atelectasis. Enlargement of cardiac silhouette with pulmonary vascular congestion. Electronically Signed   By: Ulyses Southward M.D.   On: 08/19/2015 08:56   Dg Chest 2 View  08/01/2015  CLINICAL DATA:  25 year old with shortness of breath, nausea and left-sided chest pain. EXAM: CHEST  2 VIEW COMPARISON:  07/11/2015 FINDINGS: The heart size and mediastinal contours are within normal limits. Subsegmental atelectasis noted within the right middle lobe. The visualized skeletal  structures are unremarkable. IMPRESSION: 1. Subsegmental atelectasis in the right middle lobe. Electronically Signed   By: Signa Kell M.D.   On: 08/01/2015 13:36   Ct Maxillofacial W/cm  08/21/2015  CLINICAL DATA:  Left facial swelling EXAM: CT MAXILLOFACIAL WITH CONTRAST TECHNIQUE: Multidetector CT imaging of the maxillofacial structures was performed with intravenous contrast. Multiplanar CT image reconstructions were also generated. A small metallic BB was placed on the right temple in order to reliably differentiate right from left. CONTRAST:  75mL ISOVUE-300 IOPAMIDOL (ISOVUE-300) INJECTION 61% COMPARISON:  11/16/2014 FINDINGS: There again expansive changes in the bony structures consistent with the given clinical history of sickle cell disease. Advanced tooth decay is again identified with progression when compare with the prior exam particularly in the mandibular molars bilaterally. No acute fracture is identified. The periapical lucency seen in the left mandible on the prior exam are stable in appearance. Mild soft tissue swelling is noted on the left although no definitive abscess is seen. The overall appearance appears mildly improved when compared with prior exam. Mild  mucosal thickening within the paranasal sinuses is noted. The visualized intracranial structures are unremarkable. The orbits and their contents are within normal limits. IMPRESSION: Advanced dental decay progressed from the prior exam. Stable changes of sickle cell disease. Left facial swelling without definitive abscess. This appears somewhat improved when compared with the prior exam. Electronically Signed   By: Alcide Clever M.D.   On: 08/21/2015 10:07    Scheduled Meds: . ampicillin-sulbactam (UNASYN) IV  3 g Intravenous Q6H  . chlorhexidine  15 mL Mouth/Throat QID  . enoxaparin (LOVENOX) injection  40 mg Subcutaneous Q24H  . HYDROmorphone   Intravenous Q4H  . predniSONE  40 mg Oral Q breakfast  . vancomycin  1,000 mg  Intravenous Q8H   Continuous Infusions: . dextrose 5 % and 0.45% NaCl 125 mL/hr at 08/21/15 2352    Time spent 35 minutes. Greater than 50% involved face to face contact with the patient for assessment, counseling and coordination of care.

## 2015-08-23 DIAGNOSIS — K045 Chronic apical periodontitis: Secondary | ICD-10-CM

## 2015-08-23 DIAGNOSIS — K122 Cellulitis and abscess of mouth: Secondary | ICD-10-CM

## 2015-08-23 DIAGNOSIS — D5700 Hb-SS disease with crisis, unspecified: Principal | ICD-10-CM

## 2015-08-23 DIAGNOSIS — K083 Retained dental root: Secondary | ICD-10-CM

## 2015-08-23 DIAGNOSIS — R220 Localized swelling, mass and lump, head: Secondary | ICD-10-CM

## 2015-08-23 DIAGNOSIS — K029 Dental caries, unspecified: Secondary | ICD-10-CM

## 2015-08-23 DIAGNOSIS — K0401 Reversible pulpitis: Secondary | ICD-10-CM

## 2015-08-23 LAB — CBC WITH DIFFERENTIAL/PLATELET
Basophils Absolute: 0.2 10*3/uL — ABNORMAL HIGH (ref 0.0–0.1)
Basophils Relative: 1 %
EOS PCT: 2 %
Eosinophils Absolute: 0.4 10*3/uL (ref 0.0–0.7)
HEMATOCRIT: 17.8 % — AB (ref 39.0–52.0)
HEMOGLOBIN: 6.3 g/dL — AB (ref 13.0–17.0)
LYMPHS ABS: 5.3 10*3/uL — AB (ref 0.7–4.0)
Lymphocytes Relative: 27 %
MCH: 32 pg (ref 26.0–34.0)
MCHC: 35.4 g/dL (ref 30.0–36.0)
MCV: 90.4 fL (ref 78.0–100.0)
MONO ABS: 2.2 10*3/uL — AB (ref 0.1–1.0)
Monocytes Relative: 11 %
Neutro Abs: 11.7 10*3/uL — ABNORMAL HIGH (ref 1.7–7.7)
Neutrophils Relative %: 59 %
Platelets: 272 10*3/uL (ref 150–400)
RBC: 1.97 MIL/uL — AB (ref 4.22–5.81)
RDW: 17.9 % — AB (ref 11.5–15.5)
WBC: 19.8 10*3/uL — AB (ref 4.0–10.5)

## 2015-08-23 LAB — BASIC METABOLIC PANEL
Anion gap: 5 (ref 5–15)
BUN: 10 mg/dL (ref 6–20)
CHLORIDE: 110 mmol/L (ref 101–111)
CO2: 23 mmol/L (ref 22–32)
CREATININE: 0.55 mg/dL — AB (ref 0.61–1.24)
Calcium: 8.7 mg/dL — ABNORMAL LOW (ref 8.9–10.3)
GFR calc Af Amer: >60 mL/min (ref >60)
Glucose, Bld: 104 mg/dL — ABNORMAL HIGH (ref 65–99)
Potassium: 3.9 mmol/L (ref 3.5–5.1)
SODIUM: 138 mmol/L (ref 135–145)

## 2015-08-23 MED ORDER — OXYCODONE HCL 5 MG PO TABS
30.0000 mg | ORAL_TABLET | ORAL | Status: DC
  Administered 2015-08-23 – 2015-08-24 (×4): 30 mg via ORAL
  Filled 2015-08-23 (×5): qty 6

## 2015-08-23 MED ORDER — AMOXICILLIN-POT CLAVULANATE 875-125 MG PO TABS
1.0000 | ORAL_TABLET | Freq: Two times a day (BID) | ORAL | Status: DC
  Administered 2015-08-23 (×2): 1 via ORAL
  Filled 2015-08-23 (×2): qty 1

## 2015-08-23 NOTE — Progress Notes (Signed)
Pharmacy Antibiotic Note  Matthew HarrisonBobby Shannon is a 25 y.o. male admitted on 08/21/2015 with palatal abscess.  Pharmacy has been consulted for vancomycin dosing, MD dosing Unasyn. Pt seen and diagnosed with left facial swelling. Pt seen by dentistry for dental caries and is s/p I/D for palatal abscess.   Plan:  Check steady state vancomycin trough tonight, goal = 15-20 mcg/ml  Unasyn 3 g IV q6h ( MD dosing )   Height: 5\' 10"  (177.8 cm) Weight: 135 lb (61.236 kg) IBW/kg (Calculated) : 73  Temp (24hrs), Avg:98.4 F (36.9 C), Min:98.1 F (36.7 C), Max:98.7 F (37.1 C)   Recent Labs Lab 08/18/15 2333 08/19/15 0454 08/20/15 0526 08/21/15 0848 08/22/15 0347 08/23/15 0859  WBC 17.9* 19.6* 18.2* 30.2* 31.6*  --   CREATININE 0.58* 0.55*  --  0.38* 0.41* 0.55*    Estimated Creatinine Clearance: 120.1 mL/min (by C-G formula based on Cr of 0.55).    No Known Allergies  Antimicrobials this admission: 7/12 Unasyn >>  7/12 Vancomycin >>   Dose adjustments this admission: 7/14 VT at 1530 = ___ on 1g q8h  Microbiology results: 7/12 BCx: ngtd  Thank you for allowing pharmacy to be a part of this patient's care.  Loralee PacasErin Carinna Newhart, PharmD, BCPS Pager: [phone]  08/23/2015 9:51 AM

## 2015-08-23 NOTE — Progress Notes (Signed)
POST OPERATIVE NOTE:  08/23/2015 Matthew HarrisonBobby Shannon [id number]  VITALS: BP 91/55 mmHg  Pulse 84  Temp(Src) 98.8 F (37.1 C) (Oral)  Resp 12  Ht 5\' 10"  (1.778 m)  Wt 135 lb (61.236 kg)  BMI 19.37 kg/m2  SpO2 99%  LABS:  CBC Latest Ref Rng 08/23/2015 08/22/2015 08/21/2015  WBC 4.0 - 10.5 K/uL 19.8(H) 31.6(H) 30.2(H)  Hemoglobin 13.0 - 17.0 g/dL 6.3(LL) 6.7(LL) 7.8(L)  Hematocrit 39.0 - 52.0 % 17.8(L) 18.6(L) 22.2(L)  Platelets 150 - 400 K/uL 272 254 304    Lab Results  Component Value Date   WBC 19.8* 08/23/2015   HGB 6.3* 08/23/2015   HCT 17.8* 08/23/2015   MCV 90.4 08/23/2015   PLT 272 08/23/2015   BMET    Component Value Date/Time   NA 138 08/23/2015 0859   K 3.9 08/23/2015 0859   CL 110 08/23/2015 0859   CO2 23 08/23/2015 0859   GLUCOSE 104* 08/23/2015 0859   BUN 10 08/23/2015 0859   CREATININE 0.55* 08/23/2015 0859   CALCIUM 8.7* 08/23/2015 0859   GFRNONAA >60 08/23/2015 0859   GFRAA >60 08/23/2015 0859    No results found for: INR, PROTIME No results found for: PTT   Matthew HarrisonBobby Shannon is status post incision and drainage of the palatal abscess in the dental clinic on 08/21/15.  SUBJECTIVE: Patient indicates that he is feeling much better.  Patient currently only has dental pain at a 3 out of 10 in intensity versus the original 6-7 out of 10  intensity. Patient indicates that the facial and lip swelling has decreased dramatically.  EXAM: The facial and lip swelling has decreased medically. The palatal swelling is still present consistent with the dependent nature of palatal swellings. This should resolve after definitive dental extractions that are currently scheduled for Thursday. Patient still has leukocytosis that is trending downwards. Patient with low hemoglobin with probable transfusion prior to dental extraction procedures on Thursday, 08/29/2015.  ASSESSMENT: 1. Decreased lip and facial swelling. 2. Palatal swelling still present but decreased in size. 3.  Multiple retained root segments 4. Rampant dental caries 5. Chronic apical periodontitis   PLAN: 1. Continue IV antibiotic therapy per medical team with switch to oral antibiotic therapy as indicated. 2. Medical team to consider transfusion at this time due to impending oral surgery on 08/29/2015 3. Multiple dental extractions with alveoloplasty in the operating room with general anesthesia on Thursday, 08/29/2015 at 7:30 AM. This can be done as either an inpatient or outpatient. 4. Need for follow-up with primary dentist for exam, radiographs, discussion of other dental treatment needs.   Charlynne Panderonald F. Dev Dhondt, DDS

## 2015-08-23 NOTE — Progress Notes (Addendum)
SICKLE CELL SERVICE PROGRESS NOTE  Eleanor Dimichele ZOX:[id number] DOB: 04-23-1990 DOA: 08/21/2015 PCP: Jeanann Lewandowsky, MD  Assessment/Plan: Active Problems:   Sickle cell anemia with crisis (HCC)   Left facial swelling   Infected dental caries   Abscess of palate  1. Palatal Abscess: Pt is s/p I&D of palatal abscess found on examination and orthopantogram. His day #3 of Unasyn and Vancomycin. Today he is afebrile and with even more decrease in swelling of face and jaw . His WBC count is significantly decreased so will transition to oral antibiotics- Augmentin 875 BID.Marland Kitchen Unfortunately I have no cultures to give guidance on antibiotic choice so will have to follow clinical course.  2. Dental Caries: Pt to follow up with Dr. Kristin Bruins in the ambulatory for further treatment of caries. 3. Leukocytosis: Secondary to infection. 4. Anemia of chronic disease: Will T&S. If WBC count persistently elevated may need to consider transfusion. 5. Hb SS with Crisis: Pt in crisis likely triggered by infection. Will schedule oral analgesics, continue PCA and Toradol and discontinue clinician assisted doses. Pt drinking well so will decrease IVF rate.    Code Status: Full Code Family Communication: N/A Disposition Plan: Not yet ready for discharge  MATTHEWS,[name]  Pager [phone]. If 7PM-7AM, please contact night-coverage.  08/23/2015, 12:35 PM  LOS: 2 days   Interim History: Pt s/p I&D of palatal abscess yesterday. Marked decrease in facial swelling. Pt reports pain at 6/10 in the face and 5-6/10 in low back. He has used  with 190/65: demands/deliveries. Last BM 2 days ago.  Consultants:  Dr. Kristin Bruins- Dental Services  Procedures:  I&D Palatal abscess  Antibiotics:  Unasyn 7/12>> 7/14  Vancomycin 7/12>> 7/14  Augmentin 7/14 >>    Objective: Filed Vitals:   08/23/15 0602 08/23/15 0746 08/23/15 0939 08/23/15 1039  BP: 93/55  91/55   Pulse: 78  84   Temp: 98.5 F (36.9 C)  98.8  F (37.1 C)   TempSrc: Oral  Oral   Resp: Height:      Weight:      SpO2: 99% 100% 100% 99%   Weight change:   Intake/Output Summary (Last 24 hours) at 08/23/15 1235 Last data filed at 08/22/15 2131  Gross per 24 hour  Intake    240 ml  Output   1575 ml  Net  -1335 ml    General: Alert, awake, oriented x3, in no acute distress. Non-toxic appearing. HEENT: [place], EOMI. Facial swelling decreased since yesterday Neck: Trachea midline,  no masses, no thyromegal,y no JVD, no carotid bruit OROPHARYNX:  Moist, No exudate/ erythema/lesions.  Heart: Regular rate and rhythm, without murmurs, rubs, gallops, PMI non-displaced, no heaves or thrills on palpation.  Lungs: Clear to auscultation, no wheezing or rhonchi noted. No increased vocal fremitus resonant to percussion  Abdomen: Soft, nontender, nondistended, positive bowel sounds, no masses no hepatosplenomegaly noted.  Neuro: No focal neurological deficits noted cranial nerves II through XII grossly intact.  Strength at functional baseline in bilateral upper and lower extremities. Musculoskeletal: No warmth, swelling or erythema around joints, no spinal tenderness noted. Psychiatric: Patient alert and oriented x3, good insight and cognition, good recent to remote recall.    Data Reviewed: Basic Metabolic Panel:  Recent Labs Lab 08/18/15 1605 08/18/15 2333 08/19/15 0454 08/21/15 0848 08/22/15 0347 08/23/15 0859  NA 139  --  138 134* 137 138  K 4.2  --  3.6 3.8 3.8 3.9  CL 108  --  107 102 104 110  CO2 26  --  26 25 26 23   GLUCOSE 99  --  105* 108* 136* 104*  BUN 7  --  7 8 12 10   CREATININE 0.47* 0.58* 0.55* 0.38* 0.41* 0.55*  CALCIUM 9.6  --  9.0 9.3 9.2 8.7*   Liver Function Tests:  Recent Labs Lab 08/17/15 1622 08/18/15 1605  AST 29 29  ALT 10* 13*  ALKPHOS 77 79  BILITOT 3.9* 3.8*  PROT 8.3* 8.4*  ALBUMIN 4.7 4.9   No results for input(s): LIPASE, AMYLASE in the last 168 hours. No results  for input(s): AMMONIA in the last 168 hours. CBC:  Recent Labs Lab 08/19/15 0454 08/20/15 0526 08/21/15 0848 08/22/15 0347 08/23/15 1023  WBC 19.6* 18.2* 30.2* 31.6* 19.8*  NEUTROABS 9.5* 10.7* 24.5* 23.1* 11.7*  HGB 7.7* 7.0* 7.8* 6.7* 6.3*  HCT 21.5* 19.9* 22.2* 18.6* 17.8*  MCV 91.9 92.6 91.0 89.4 90.4  PLT 263 262 304 254 272   Cardiac Enzymes: No results for input(s): CKTOTAL, CKMB, CKMBINDEX, TROPONINI in the last 168 hours. BNP (last 3 results) No results for input(s): BNP in the last 8760 hours.  ProBNP (last 3 results) No results for input(s): PROBNP in the last 8760 hours.  CBG: No results for input(s): GLUCAP in the last 168 hours.  Recent Results (from the past 240 hour(s))  Blood culture (routine x 2)     Status: None (Preliminary result)   Collection Time: 08/21/15 12:15 PM  Result Value Ref Range Status   Specimen Description BLOOD RIGHT HAND  Final   Special Requests BOTTLES DRAWN AEROBIC AND ANAEROBIC 5CC  Final   Culture   Final    NO GROWTH 1 DAY Performed at [facility]    Report Status PENDING  Incomplete  Blood culture (routine x 2)     Status: None (Preliminary result)   Collection Time: 08/21/15 12:15 PM  Result Value Ref Range Status   Specimen Description BLOOD LEFT HAND  Final   Special Requests BOTTLES DRAWN AEROBIC AND ANAEROBIC 5CC  Final   Culture   Final    NO GROWTH 1 DAY [facility]    Report Status PENDING  Incomplete     Studies: Dg Chest 2 View  08/19/2015  CLINICAL DATA:  Smoker, sickle cell disease, pain EXAM: CHEST  2 VIEW COMPARISON:  08/01/2015 FINDINGS: Enlargement of cardiac silhouette with pulmonary vascular congestion. Mediastinal contours normal. Increased atelectasis RIGHT base. Chronic accentuation of perihilar markings particularly on RIGHT, unchanged. No definite acute infiltrate, pleural effusion or pneumothorax. Minimal chronic peribronchial thickening. Dextroconvex thoracic  scoliosis. IMPRESSION: Scattered chronic lung changes with increased RIGHT basilar atelectasis. Enlargement of cardiac silhouette with pulmonary vascular congestion. Electronically Signed   By: Ulyses SouthwardMark  Boles M.D.   On: 08/19/2015 08:56   Dg Chest 2 View  08/01/2015  CLINICAL DATA:  25 year old with shortness of breath, nausea and left-sided chest pain. EXAM: CHEST  2 VIEW COMPARISON:  07/11/2015 FINDINGS: The heart size and mediastinal contours are within normal limits. Subsegmental atelectasis noted within the right middle lobe. The visualized skeletal structures are unremarkable. IMPRESSION: 1. Subsegmental atelectasis in the right middle lobe. Electronically Signed   By: Signa Kellaylor  Stroud M.D.   On: 08/01/2015 13:36   Ct Maxillofacial W/cm  08/21/2015  CLINICAL DATA:  Left facial swelling EXAM: CT MAXILLOFACIAL WITH CONTRAST TECHNIQUE: Multidetector CT imaging of the maxillofacial structures was performed with intravenous contrast. Multiplanar CT image reconstructions were  also generated. A small metallic BB was placed on the right temple in order to reliably differentiate right from left. CONTRAST:  75mL ISOVUE-300 IOPAMIDOL (ISOVUE-300) INJECTION 61% COMPARISON:  11/16/2014 FINDINGS: There again expansive changes in the bony structures consistent with the given clinical history of sickle cell disease. Advanced tooth decay is again identified with progression when compare with the prior exam particularly in the mandibular molars bilaterally. No acute fracture is identified. The periapical lucency seen in the left mandible on the prior exam are stable in appearance. Mild soft tissue swelling is noted on the left although no definitive abscess is seen. The overall appearance appears mildly improved when compared with prior exam. Mild mucosal thickening within the paranasal sinuses is noted. The visualized intracranial structures are unremarkable. The orbits and their contents are within normal limits. IMPRESSION:  Advanced dental decay progressed from the prior exam. Stable changes of sickle cell disease. Left facial swelling without definitive abscess. This appears somewhat improved when compared with the prior exam. Electronically Signed   By: Alcide Clever M.D.   On: 08/21/2015 10:07    Scheduled Meds: . sodium chloride   Intravenous Once  . ampicillin-sulbactam (UNASYN) IV  3 g Intravenous Q6H  . chlorhexidine  15 mL Mouth/Throat QID  . enoxaparin (LOVENOX) injection  40 mg Subcutaneous Q24H  . HYDROmorphone   Intravenous Q4H  . ketorolac  30 mg Intravenous Q6H  . vancomycin  1,000 mg Intravenous Q8H   Continuous Infusions: . dextrose 5 % and 0.45% NaCl 125 mL/hr at 08/23/15 0405    Time spent 25 minutes.

## 2015-08-24 ENCOUNTER — Emergency Department (HOSPITAL_COMMUNITY)
Admission: EM | Admit: 2015-08-24 | Discharge: 2015-08-24 | Disposition: A | Discharge status: Home / Self Care | Payer: Medicaid Other | Attending: Emergency Medicine | Admitting: Emergency Medicine

## 2015-08-24 ENCOUNTER — Encounter (HOSPITAL_COMMUNITY): Payer: Self-pay | Admitting: Emergency Medicine

## 2015-08-24 DIAGNOSIS — G8929 Other chronic pain: Secondary | ICD-10-CM | POA: Diagnosis not present

## 2015-08-24 DIAGNOSIS — F17210 Nicotine dependence, cigarettes, uncomplicated: Secondary | ICD-10-CM | POA: Insufficient documentation

## 2015-08-24 LAB — CBC WITH DIFFERENTIAL/PLATELET
Basophils Absolute: 0.2 10*3/uL — ABNORMAL HIGH (ref 0.0–0.1)
Basophils Relative: 1 %
EOS ABS: 0.6 10*3/uL (ref 0.0–0.7)
Eosinophils Relative: 4 %
HCT: 17 % — ABNORMAL LOW (ref 39.0–52.0)
HEMOGLOBIN: 6 g/dL — AB (ref 13.0–17.0)
Lymphocytes Relative: 42 %
Lymphs Abs: 6.8 10*3/uL — ABNORMAL HIGH (ref 0.7–4.0)
MCH: 32.6 pg (ref 26.0–34.0)
MCHC: 35.3 g/dL (ref 30.0–36.0)
MCV: 92.4 fL (ref 78.0–100.0)
MONOS PCT: 11 %
Monocytes Absolute: 1.8 10*3/uL — ABNORMAL HIGH (ref 0.1–1.0)
NEUTROS ABS: 6.8 10*3/uL (ref 1.7–7.7)
NEUTROS PCT: 42 %
Platelets: 273 10*3/uL (ref 150–400)
RBC: 1.84 MIL/uL — ABNORMAL LOW (ref 4.22–5.81)
RDW: 19 % — ABNORMAL HIGH (ref 11.5–15.5)
WBC: 16.2 10*3/uL — AB (ref 4.0–10.5)
nRBC: 3 /100 WBC — ABNORMAL HIGH

## 2015-08-24 MED ORDER — OXYCODONE HCL 30 MG PO TABS: 30.0000 mg | ORAL_TABLET | ORAL | Status: DC | PRN

## 2015-08-24 MED ORDER — AMOXICILLIN-POT CLAVULANATE 875-125 MG PO TABS: 1.0000 | ORAL_TABLET | Freq: Two times a day (BID) | ORAL | Status: DC

## 2015-08-24 NOTE — ED Notes (Signed)
[facility] that [facility] just [facility] from [facility] 3rd floor and walked to the ED to check in. EDP will see pt in triage
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
## 2015-08-24 NOTE — Discharge Instructions (Signed)
You need to diligently seek out and utilize a primary care physician for your ongoing health care needs.  Emergency room does not provide prescriptions for extended narcotic prescriptions

## 2015-08-24 NOTE — Progress Notes (Signed)
Patient discharged to home, all discharge medications and instructions reviewed and questions answered.  Patient declines assistance in wheelchair, states will ambulate.

## 2015-08-24 NOTE — ED Notes (Signed)
Pt states he was just discharged 45 mins ago and the hospitalist was not able to prescribe him a months worth of his Roxicodone and referred him to the emergency department. Pt states he is not able to go to the sickle cell clinic and does not have a primary care doctor and cannot get in to see one until September.

## 2015-08-24 NOTE — Discharge Summary (Signed)
Physician Discharge Summary  Patient ID: Matthew HarrisonBobby Ansley MRN: [id number] DOB/AGE: 27/04/1990 27 y.o.  Admit date: 08/21/2015 Discharge date: 08/24/2015  Admission Diagnoses:  Discharge Diagnoses:  Active Problems:   Sickle cell anemia with crisis (HCC)   Left facial swelling   Infected dental caries   Abscess of palate   Discharged Condition: good  Hospital Course: Patient admitted with sickle cell painful crisis probably triggered by dental abscess. He had a white count of more than 30,000 and CT maxillofacial showed dental caries but no frank abscess. He was treated for his sickle cell crisis with IV Dilaudid PCA and Toradol. He was also treated with Unasyn IV for his dental caries. He subsequently was transitioned to Augmentin. At time of discharge patient was transient to oral pain medications. He was given 30 tablets of his oxycodone until he sees his physician. He is well known to hours not following up with his primary care physician and going to the ER multiple times for pain medications. I have counseled him extensively this time around. If he does not see his physician in the future he will not get prescription from the hospital  Consults: None  Significant Diagnostic Studies: labs: Serial CBCs and BMPs where checked. No transfusion was required  Treatments: IV hydration, antibiotics: Unasyn and analgesia: Dilaudid  Discharge Exam: Blood pressure 105/66, pulse 76, temperature 98.2 F (36.8 C), temperature source Oral, resp. rate 14, height 5\' 10"  (1.778 m), weight 61.236 kg (135 lb), SpO2 97 %. General appearance: alert, cooperative, appears stated age and no distress Head: Normocephalic, without obvious abnormality, atraumatic Neck: no adenopathy, no carotid bruit, no JVD, supple, symmetrical, trachea midline and thyroid not enlarged, symmetric, no tenderness/mass/nodules Back: symmetric, no curvature. ROM normal. No CVA tenderness. Resp: clear to auscultation  bilaterally Cardio: regular rate and rhythm, S1, S2 normal, no murmur, click, rub or gallop GI: soft, non-tender; bowel sounds normal; no masses,  no organomegaly Extremities: extremities normal, atraumatic, no cyanosis or edema Pulses: 2+ and symmetric Skin: Skin color, texture, turgor normal. No rashes or lesions  Disposition: 01-Home or Self Care     Medication List    STOP taking these medications        oxyCODONE-acetaminophen 10-325 MG tablet  Commonly known as:  PERCOCET      TAKE these medications        amoxicillin-clavulanate 875-125 MG tablet  Commonly known as:  AUGMENTIN  Take 1 tablet by mouth every 12 (twelve) hours.     folic acid 1 MG tablet  Commonly known as:  FOLVITE  Take 1 tablet (1 mg total) by mouth daily.     hydroxyurea 500 MG capsule  Commonly known as:  HYDREA  Take 2 capsules (1,000 mg total) by mouth daily. May take with food to minimize GI side effects.     oxycodone 30 MG immediate release tablet  Commonly known as:  ROXICODONE  Take 1 tablet (30 mg total) by mouth every 4 (four) hours as needed for pain.         SignedLonia Blood: GARBA,LAWAL 08/24/2015, 6:24 AM  Time spent 34 minutes

## 2015-08-26 ENCOUNTER — Other Ambulatory Visit (HOSPITAL_COMMUNITY): Payer: Self-pay | Admitting: Dentistry

## 2015-08-26 LAB — CULTURE, BLOOD (ROUTINE X 2)
CULTURE: NO GROWTH
Culture: NO GROWTH

## 2015-08-27 ENCOUNTER — Emergency Department (HOSPITAL_COMMUNITY)
Admission: EM | Admit: 2015-08-27 | Discharge: 2015-08-27 | Disposition: A | Discharge status: Home / Self Care | Payer: Medicaid Other | Attending: Emergency Medicine | Admitting: Emergency Medicine

## 2015-08-27 ENCOUNTER — Encounter (HOSPITAL_COMMUNITY): Payer: Self-pay | Admitting: Emergency Medicine

## 2015-08-27 ENCOUNTER — Telehealth (HOSPITAL_COMMUNITY): Payer: Self-pay | Admitting: *Deleted

## 2015-08-27 ENCOUNTER — Other Ambulatory Visit (HOSPITAL_COMMUNITY): Payer: Self-pay | Admitting: Dentistry

## 2015-08-27 DIAGNOSIS — Z792 Long term (current) use of antibiotics: Secondary | ICD-10-CM | POA: Diagnosis not present

## 2015-08-27 DIAGNOSIS — F17210 Nicotine dependence, cigarettes, uncomplicated: Secondary | ICD-10-CM | POA: Diagnosis not present

## 2015-08-27 DIAGNOSIS — Z79899 Other long term (current) drug therapy: Secondary | ICD-10-CM | POA: Diagnosis not present

## 2015-08-27 DIAGNOSIS — M549 Dorsalgia, unspecified: Secondary | ICD-10-CM | POA: Diagnosis present

## 2015-08-27 DIAGNOSIS — D5700 Hb-SS disease with crisis, unspecified: Secondary | ICD-10-CM | POA: Insufficient documentation

## 2015-08-27 DIAGNOSIS — D571 Sickle-cell disease without crisis: Secondary | ICD-10-CM

## 2015-08-27 LAB — CBC WITH DIFFERENTIAL/PLATELET
BASOS PCT: 0 %
Basophils Absolute: 0 10*3/uL (ref 0.0–0.1)
EOS PCT: 4 %
Eosinophils Absolute: 0.7 10*3/uL (ref 0.0–0.7)
HEMATOCRIT: 19.5 % — AB (ref 39.0–52.0)
Hemoglobin: 6.9 g/dL — CL (ref 13.0–17.0)
LYMPHS ABS: 4.4 10*3/uL — AB (ref 0.7–4.0)
Lymphocytes Relative: 24 %
MCH: 32.9 pg (ref 26.0–34.0)
MCHC: 35.4 g/dL (ref 30.0–36.0)
MCV: 92.9 fL (ref 78.0–100.0)
MONO ABS: 1.1 10*3/uL — AB (ref 0.1–1.0)
Monocytes Relative: 6 %
NEUTROS ABS: 12.3 10*3/uL — AB (ref 1.7–7.7)
Neutrophils Relative %: 66 %
Platelets: 358 10*3/uL (ref 150–400)
RBC: 2.1 MIL/uL — ABNORMAL LOW (ref 4.22–5.81)
RDW: 20.5 % — AB (ref 11.5–15.5)
WBC: 18.5 10*3/uL — ABNORMAL HIGH (ref 4.0–10.5)

## 2015-08-27 LAB — COMPREHENSIVE METABOLIC PANEL
ALT: 14 U/L — AB (ref 17–63)
AST: 18 U/L (ref 15–41)
Albumin: 3.8 g/dL (ref 3.5–5.0)
Alkaline Phosphatase: 69 U/L (ref 38–126)
Anion gap: 4 — ABNORMAL LOW (ref 5–15)
CHLORIDE: 107 mmol/L (ref 101–111)
CO2: 27 mmol/L (ref 22–32)
CREATININE: 0.56 mg/dL — AB (ref 0.61–1.24)
Calcium: 8.6 mg/dL — ABNORMAL LOW (ref 8.9–10.3)
GFR calc Af Amer: >60 mL/min (ref >60)
GFR calc non Af Amer: >60 mL/min (ref >60)
Glucose, Bld: 167 mg/dL — ABNORMAL HIGH (ref 65–99)
Potassium: 3.7 mmol/L (ref 3.5–5.1)
SODIUM: 138 mmol/L (ref 135–145)
Total Bilirubin: 2.5 mg/dL — ABNORMAL HIGH (ref 0.3–1.2)
Total Protein: 7.1 g/dL (ref 6.5–8.1)

## 2015-08-27 LAB — TYPE AND SCREEN
ABO/RH(D): O POS
Antibody Screen: NEGATIVE

## 2015-08-27 MED ORDER — DIPHENHYDRAMINE HCL 50 MG/ML IJ SOLN
25.0000 mg | Freq: Once | INTRAMUSCULAR | Status: AC
  Administered 2015-08-27: 25 mg via INTRAVENOUS
  Filled 2015-08-27: qty 1

## 2015-08-27 MED ORDER — KETOROLAC TROMETHAMINE 30 MG/ML IJ SOLN
30.0000 mg | INTRAMUSCULAR | Status: AC
  Administered 2015-08-27: 30 mg via INTRAVENOUS
  Filled 2015-08-27: qty 1

## 2015-08-27 MED ORDER — DIPHENHYDRAMINE HCL 25 MG PO CAPS
25.0000 mg | ORAL_CAPSULE | Freq: Once | ORAL | Status: AC
  Administered 2015-08-27: 25 mg via ORAL
  Filled 2015-08-27: qty 1

## 2015-08-27 MED ORDER — OXYCODONE HCL 5 MG PO TABS
5.0000 mg | ORAL_TABLET | Freq: Once | ORAL | Status: AC
  Administered 2015-08-27: 5 mg via ORAL
  Filled 2015-08-27: qty 1

## 2015-08-27 MED ORDER — HYDROMORPHONE HCL 1 MG/ML IJ SOLN
1.0000 mg | Freq: Once | INTRAMUSCULAR | Status: AC
  Administered 2015-08-27: 1 mg via INTRAVENOUS
  Filled 2015-08-27: qty 1

## 2015-08-27 MED ORDER — PROMETHAZINE HCL 25 MG/ML IJ SOLN
25.0000 mg | Freq: Once | INTRAMUSCULAR | Status: AC
  Administered 2015-08-27: 25 mg via INTRAVENOUS
  Filled 2015-08-27: qty 1

## 2015-08-27 MED ORDER — DEXTROSE-NACL 5-0.45 % IV SOLN
INTRAVENOUS | Status: DC
  Administered 2015-08-27: 15:00:00 via INTRAVENOUS

## 2015-08-27 NOTE — ED Notes (Signed)
Patient presents for back and bilateral leg pain x1-2 days r/t sickle cell crisis. Denies other c/c. A&O x4, ambulatory with steady gait.

## 2015-08-27 NOTE — Discharge Instructions (Signed)
[name],  Nice meeting you! Please follow-up with your primary care provider. Return to the emergency department if you develop increased pain, chest pain, shortness of breath, headaches, abdominal pain, new/worsening symptoms. Feel better soon!  S. Lane HackerNicole Anabelle Bungert, PA-C
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
## 2015-08-27 NOTE — Progress Notes (Signed)
[facility]  Pt last ED visit 08/24/15 for evaluation after having been discharged from the floor approximately 17 minutes earlier on 08/24/15. Was discharged with a prescription for 30 oxycodone tablets by his admitting physician- advised that the emergency room we will not write prescriptions for extended narcotic prescriptions. He was encouraged to be diligent in seeking out in utilizing a primary care physician for his ongoing health needs Last admission Admit date: 08/21/2015 Discharge date: 08/24/2015 for [facility] sickle cell painful crisis probably triggered by dental abscess Pt remains with [place] medicaid coverage even after being advised by ED CMs on availability to [facility]  Pt with ED CP [facility] Pt has been counseled by multiple providers EDPs, attending MDs, [facility] providers and CMs about need for pcp f/u care, compliance with tx plan without success  Prior to this 08/27/15 ED visit EPIC indicates [facility] to assist with scheduling an appt for transfusion on 08/28/15 at 0900 prior to dental procedure this month [facility]5 for for back and bilateral leg pain x1-2 days r/t sickle cell crisis

## 2015-08-27 NOTE — ED Notes (Signed)
Hbg 6.9. Primary nurse and Victory Dakiniley, PA-C made aware of same.

## 2015-08-27 NOTE — Telephone Encounter (Signed)
Called patient to see if he can come in today for a blood transfusion per Dr. Luretha MurphyKulinski's request. Patient says he can come in tomorrow at 9 am. Scheduled patient for tomorrow.

## 2015-08-28 ENCOUNTER — Inpatient Hospital Stay (HOSPITAL_COMMUNITY): Admission: RE | Admit: 2015-08-28 | Payer: Medicaid - Out of State | Source: Ambulatory Visit

## 2015-08-28 ENCOUNTER — Ambulatory Visit: Payer: Medicaid - Out of State | Admitting: Family Medicine

## 2015-08-28 ENCOUNTER — Other Ambulatory Visit: Payer: Self-pay | Admitting: Internal Medicine

## 2015-08-28 ENCOUNTER — Encounter (HOSPITAL_COMMUNITY): Payer: Medicaid - Out of State

## 2015-08-28 ENCOUNTER — Encounter (HOSPITAL_COMMUNITY): Admission: RE | Admit: 2015-08-28 | Payer: Medicaid - Out of State | Source: Ambulatory Visit

## 2015-08-28 ENCOUNTER — Emergency Department (HOSPITAL_COMMUNITY)
Admission: EM | Admit: 2015-08-28 | Discharge: 2015-08-28 | Disposition: A | Discharge status: Home / Self Care | Payer: Medicaid Other | Attending: Emergency Medicine | Admitting: Emergency Medicine

## 2015-08-28 ENCOUNTER — Encounter (HOSPITAL_COMMUNITY): Payer: Self-pay | Admitting: *Deleted

## 2015-08-28 ENCOUNTER — Telehealth (HOSPITAL_COMMUNITY): Payer: Self-pay | Admitting: *Deleted

## 2015-08-28 ENCOUNTER — Ambulatory Visit (HOSPITAL_COMMUNITY): Payer: Medicaid Other

## 2015-08-28 DIAGNOSIS — F17210 Nicotine dependence, cigarettes, uncomplicated: Secondary | ICD-10-CM | POA: Diagnosis not present

## 2015-08-28 DIAGNOSIS — D5700 Hb-SS disease with crisis, unspecified: Secondary | ICD-10-CM | POA: Insufficient documentation

## 2015-08-28 DIAGNOSIS — Z79899 Other long term (current) drug therapy: Secondary | ICD-10-CM | POA: Diagnosis not present

## 2015-08-28 DIAGNOSIS — Z792 Long term (current) use of antibiotics: Secondary | ICD-10-CM | POA: Diagnosis not present

## 2015-08-28 DIAGNOSIS — M791 Myalgia: Secondary | ICD-10-CM | POA: Diagnosis present

## 2015-08-28 LAB — COMPREHENSIVE METABOLIC PANEL
ALT: 15 U/L — ABNORMAL LOW (ref 17–63)
AST: 22 U/L (ref 15–41)
Albumin: 4.5 g/dL (ref 3.5–5.0)
Alkaline Phosphatase: 75 U/L (ref 38–126)
Anion gap: 7 (ref 5–15)
BUN: 7 mg/dL (ref 6–20)
CHLORIDE: 108 mmol/L (ref 101–111)
CO2: 25 mmol/L (ref 22–32)
Calcium: 9.2 mg/dL (ref 8.9–10.3)
Creatinine, Ser: 0.54 mg/dL — ABNORMAL LOW (ref 0.61–1.24)
Glucose, Bld: 106 mg/dL — ABNORMAL HIGH (ref 65–99)
POTASSIUM: 3.9 mmol/L (ref 3.5–5.1)
Sodium: 140 mmol/L (ref 135–145)
Total Bilirubin: 2.7 mg/dL — ABNORMAL HIGH (ref 0.3–1.2)
Total Protein: 8 g/dL (ref 6.5–8.1)

## 2015-08-28 LAB — CBC WITH DIFFERENTIAL/PLATELET
Basophils Absolute: 0.1 10*3/uL (ref 0.0–0.1)
Basophils Relative: 1 %
EOS ABS: 0.5 10*3/uL (ref 0.0–0.7)
Eosinophils Relative: 3 %
HCT: 21.9 % — ABNORMAL LOW (ref 39.0–52.0)
HEMOGLOBIN: 7.8 g/dL — AB (ref 13.0–17.0)
LYMPHS ABS: 3.2 10*3/uL (ref 0.7–4.0)
LYMPHS PCT: 23 %
MCH: 33.1 pg (ref 26.0–34.0)
MCHC: 35.6 g/dL (ref 30.0–36.0)
MCV: 92.8 fL (ref 78.0–100.0)
Monocytes Absolute: 1.5 10*3/uL — ABNORMAL HIGH (ref 0.1–1.0)
Monocytes Relative: 11 %
NEUTROS ABS: 8.8 10*3/uL — AB (ref 1.7–7.7)
NEUTROS PCT: 62 %
Platelets: 383 10*3/uL (ref 150–400)
RBC: 2.36 MIL/uL — AB (ref 4.22–5.81)
RDW: 20 % — ABNORMAL HIGH (ref 11.5–15.5)
WBC: 14.1 10*3/uL — AB (ref 4.0–10.5)

## 2015-08-28 MED ORDER — KETOROLAC TROMETHAMINE 30 MG/ML IJ SOLN
30.0000 mg | Freq: Once | INTRAMUSCULAR | Status: AC
  Administered 2015-08-28: 30 mg via INTRAVENOUS
  Filled 2015-08-28: qty 1

## 2015-08-28 MED ORDER — OXYCODONE HCL 30 MG PO TABS: 30.0000 mg | ORAL_TABLET | ORAL | Status: DC | PRN

## 2015-08-28 MED ORDER — DEXTROSE-NACL 5-0.45 % IV SOLN
INTRAVENOUS | Status: DC
  Administered 2015-08-28: 15:00:00 via INTRAVENOUS

## 2015-08-28 MED ORDER — HYDROMORPHONE HCL 2 MG/ML IJ SOLN
3.0000 mg | INTRAMUSCULAR | Status: DC
  Administered 2015-08-28 (×2): 3 mg via INTRAVENOUS
  Filled 2015-08-28 (×2): qty 2

## 2015-08-28 MED ORDER — OXYCODONE HCL 5 MG PO TABS
5.0000 mg | ORAL_TABLET | Freq: Once | ORAL | Status: AC
  Administered 2015-08-28: 5 mg via ORAL
  Filled 2015-08-28: qty 1

## 2015-08-28 MED ORDER — PROMETHAZINE HCL 25 MG/ML IJ SOLN
25.0000 mg | Freq: Once | INTRAMUSCULAR | Status: AC
  Administered 2015-08-28: 25 mg via INTRAVENOUS
  Filled 2015-08-28: qty 1

## 2015-08-28 MED ORDER — HYDROXYUREA 500 MG PO CAPS: 1000.0000 mg | ORAL_CAPSULE | Freq: Every day | ORAL | Status: DC

## 2015-08-28 MED ORDER — DIPHENHYDRAMINE HCL 50 MG/ML IJ SOLN
12.5000 mg | Freq: Once | INTRAMUSCULAR | Status: AC
  Administered 2015-08-28: 12.5 mg via INTRAVENOUS
  Filled 2015-08-28: qty 1

## 2015-08-28 NOTE — Progress Notes (Addendum)
Pt with 46 ED visits This is visit #3 this week and 6 admission (last admission 08/21/15 to 08/24/15)  Pt continues with medicaid of [place] coverage  ED Cm spoke with pt to inquire about ED RN note stating he has a new doctor pending appt Pt states Hyman HopesJegede is no longer his doctor "I was not told to follow up with Jegede at discharge" Pt states [facility] [place] [facility] [place] [facility]m [facility] [phone] who states pt appt is 08/29/2015 3 pm with Dr Mordecai Maessanchez  1410 CM spoke with [facility] to confirm [facility] pt had a pre surg appt at 1400 on 08/28/15 but [facility] at 1247  Pt scheduled on 7/201/7 at 0715 for dental surgery-extractions with Dr Priscille Heidelberg Kulinski per EPIC   Entered in d/c instructions  Sanchez-Brugal, Rockey SituFernando A Go on 08/29/2015 You have a 3 pm appointment wiht Dr Mordecai MaesSanchez at [facility] [place] [address] [place] [address] [phone]  Charlynne Panderonald F Kulinski, DDS Go on 08/29/2015 You have a 0715 08/29/15 appt for teeth extraction [address] [place] [address] [phone]
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
## 2015-08-28 NOTE — ED Notes (Signed)
Patient was alert, oriented and stable upon discharge. RN went over AVS and patient had no further questions.  

## 2015-08-28 NOTE — ED Notes (Signed)
MD at bedside. 

## 2015-08-28 NOTE — ED Provider Notes (Signed)
1630, signed out by Dr Cyndie ChimeNguyen that labs are back, d/c instructions done, to click dispo d/c when pain improved.  Recheck patient - patient appears comfortable, using phone, no apparent distress. Patient notes pain is improved.   Patient awake/alert, no resp depression or excessive drowsiness.  Patient currently appears stable for d/c.      Matthew LaineKevin Edon Hoadley, MD 08/28/15 [phone]

## 2015-08-28 NOTE — Telephone Encounter (Signed)
[facility] at [facility] am. Patient did not show up for this appointment. Contacted patient by telephone and spoke with [name] and he stated "I am not able to come". He will contact his health provider.

## 2015-08-28 NOTE — ED Notes (Signed)
Pt reports sickle cell crisis, pain in his back x 3 days.  Was seen yesterday for same.  States he has a new doctor and has a pending appt.

## 2015-08-28 NOTE — Discharge Instructions (Signed)
Sickle Cell Anemia, Adult Sickle cell anemia is a condition in which red blood cells have an abnormal "sickle" shape. This abnormal shape shortens the cells' life span, which results in a lower than normal concentration of red blood cells in the blood. The sickle shape also causes the cells to clump together and block free blood flow through the blood vessels. As a result, the tissues and organs of the body do not receive enough oxygen. Sickle cell anemia causes organ damage and pain and increases the risk of infection. CAUSES  Sickle cell anemia is a genetic disorder. Those who receive two copies of the gene have the condition, and those who receive one copy have the trait. RISK FACTORS The sickle cell gene is most common in people whose families originated in [place]. Other areas of the globe where sickle cell trait occurs include the Mediterranean, [place] and [place], the [place], and the [place].  SIGNS AND SYMPTOMS  Pain, especially in the extremities, back, chest, or abdomen (common). The pain may start suddenly or may develop following an illness, especially if there is dehydration. Pain can also occur due to overexertion or exposure to extreme temperature changes.  Frequent severe bacterial infections, especially certain types of pneumonia and meningitis.  Pain and swelling in the hands and feet.  Decreased activity.   Loss of appetite.   Change in behavior.  Headaches.  Seizures.  Shortness of breath or difficulty breathing.  Vision changes.  Skin ulcers. Those with the trait may not have symptoms or they may have mild symptoms.  DIAGNOSIS  Sickle cell anemia is diagnosed with blood tests that demonstrate the genetic trait. It is often diagnosed during the newborn period, due to mandatory testing nationwide. A variety of blood tests, X-rays, CT scans, MRI scans, ultrasounds, and lung function tests may also be done to monitor the condition. TREATMENT  Sickle  cell anemia may be treated with:  Medicines. You may be given pain medicines, antibiotic medicines (to treat and prevent infections) or medicines to increase the production of certain types of hemoglobin.  Fluids.  Oxygen.  Blood transfusions. HOME CARE INSTRUCTIONS   Drink enough fluid to keep your urine clear or pale yellow. Increase your fluid intake in hot weather and during exercise.  Do not smoke. Smoking lowers oxygen levels in the blood.   Only take over-the-counter or prescription medicines for pain, fever, or discomfort as directed by your health care provider.  Take antibiotics as directed by your health care provider. Make sure you finish them it even if you start to feel better.   Take supplements as directed by your health care provider.   Consider wearing a medical alert bracelet. This tells anyone caring for you in an emergency of your condition.   When traveling, keep your medical information, health care provider's names, and the medicines you take with you at all times.   If you develop a fever, do not take medicines to reduce the fever right away. This could cover up a problem that is developing. Notify your health care provider.  Keep all follow-up appointments with your health care provider. Sickle cell anemia requires regular medical care. SEEK MEDICAL CARE IF: You have a fever. SEEK IMMEDIATE MEDICAL CARE IF:   You feel dizzy or faint.   You have new abdominal pain, especially on the left side near the stomach area.   You develop a persistent, often uncomfortable and painful penile erection (priapism). If this is not treated immediately it   will lead to impotence.   You have numbness your arms or legs or you have a hard time moving them.   You have a hard time with speech.   You have a fever or persistent symptoms for more than 2-3 days.   You have a fever and your symptoms suddenly get worse.   You have signs or symptoms of infection.  These include:   Chills.   Abnormal tiredness (lethargy).   Irritability.   Poor eating.   Vomiting.   You develop pain that is not helped with medicine.   You develop shortness of breath.  You have pain in your chest.   You are coughing up pus-like or bloody sputum.   You develop a stiff neck.  Your feet or hands swell or have pain.  Your abdomen appears bloated.  You develop joint pain. MAKE SURE YOU:  Understand these instructions.   This information is not intended to replace advice given to you by your health care provider. Make sure you discuss any questions you have with your health care provider.   Document Released: 05/06/2005 Document Revised: 02/16/2014 Document Reviewed: 09/07/2012 Elsevier Interactive Patient Education 2016 Elsevier Inc.  

## 2015-08-29 ENCOUNTER — Emergency Department (HOSPITAL_COMMUNITY)
Admission: EM | Admit: 2015-08-29 | Discharge: 2015-08-29 | Disposition: A | Discharge status: Home / Self Care | Payer: Medicaid Other | Attending: Emergency Medicine | Admitting: Emergency Medicine

## 2015-08-29 ENCOUNTER — Encounter (HOSPITAL_COMMUNITY): Payer: Self-pay | Admitting: *Deleted

## 2015-08-29 ENCOUNTER — Encounter (HOSPITAL_COMMUNITY): Admission: RE | Payer: Self-pay | Source: Ambulatory Visit

## 2015-08-29 ENCOUNTER — Ambulatory Visit (HOSPITAL_COMMUNITY): Admission: RE | Admit: 2015-08-29 | Payer: Medicaid Other | Source: Ambulatory Visit | Admitting: Dentistry

## 2015-08-29 DIAGNOSIS — Z792 Long term (current) use of antibiotics: Secondary | ICD-10-CM | POA: Insufficient documentation

## 2015-08-29 DIAGNOSIS — F17210 Nicotine dependence, cigarettes, uncomplicated: Secondary | ICD-10-CM | POA: Diagnosis not present

## 2015-08-29 DIAGNOSIS — Z79899 Other long term (current) drug therapy: Secondary | ICD-10-CM | POA: Diagnosis not present

## 2015-08-29 DIAGNOSIS — D5700 Hb-SS disease with crisis, unspecified: Secondary | ICD-10-CM

## 2015-08-29 LAB — BASIC METABOLIC PANEL WITH GFR
Anion gap: 5 (ref 5–15)
BUN: 6 mg/dL (ref 6–20)
CO2: 27 mmol/L (ref 22–32)
Calcium: 9.1 mg/dL (ref 8.9–10.3)
Chloride: 109 mmol/L (ref 101–111)
Creatinine, Ser: 0.71 mg/dL (ref 0.61–1.24)
GFR calc Af Amer: >60 mL/min
GFR calc non Af Amer: >60 mL/min
Glucose, Bld: 78 mg/dL (ref 65–99)
Potassium: 3.9 mmol/L (ref 3.5–5.1)
Sodium: 141 mmol/L (ref 135–145)

## 2015-08-29 LAB — CBC WITH DIFFERENTIAL/PLATELET
Basophils Absolute: 0.1 K/uL (ref 0.0–0.1)
Basophils Relative: 1 %
Eosinophils Absolute: 0.8 K/uL — ABNORMAL HIGH (ref 0.0–0.7)
Eosinophils Relative: 5 %
HCT: 22.7 % — ABNORMAL LOW (ref 39.0–52.0)
Hemoglobin: 7.6 g/dL — ABNORMAL LOW (ref 13.0–17.0)
Lymphocytes Relative: 32 %
Lymphs Abs: 4.9 K/uL — ABNORMAL HIGH (ref 0.7–4.0)
MCH: 32.3 pg (ref 26.0–34.0)
MCHC: 33.5 g/dL (ref 30.0–36.0)
MCV: 96.6 fL (ref 78.0–100.0)
Monocytes Absolute: 1.7 K/uL — ABNORMAL HIGH (ref 0.1–1.0)
Monocytes Relative: 11 %
Neutro Abs: 8.1 K/uL — ABNORMAL HIGH (ref 1.7–7.7)
Neutrophils Relative %: 51 %
Platelets: 388 K/uL (ref 150–400)
RBC: 2.35 MIL/uL — ABNORMAL LOW (ref 4.22–5.81)
RDW: 18.9 % — ABNORMAL HIGH (ref 11.5–15.5)
WBC: 15.5 K/uL — ABNORMAL HIGH (ref 4.0–10.5)

## 2015-08-29 LAB — RETICULOCYTES
RBC.: 2.35 MIL/uL — ABNORMAL LOW (ref 4.22–5.81)
Retic Count, Absolute: 406.6 K/uL — ABNORMAL HIGH (ref 19.0–186.0)
Retic Ct Pct: 17.3 % — ABNORMAL HIGH (ref 0.4–3.1)

## 2015-08-29 SURGERY — MULTIPLE EXTRACTION WITH ALVEOLOPLASTY
Anesthesia: General

## 2015-08-29 MED ORDER — KETOROLAC TROMETHAMINE 30 MG/ML IJ SOLN
30.0000 mg | Freq: Once | INTRAMUSCULAR | Status: AC
  Administered 2015-08-29: 30 mg via INTRAVENOUS
  Filled 2015-08-29: qty 1

## 2015-08-29 MED ORDER — HYDROMORPHONE HCL 2 MG/ML IJ SOLN
3.0000 mg | Freq: Once | INTRAMUSCULAR | Status: AC
  Administered 2015-08-29: 3 mg via INTRAVENOUS
  Filled 2015-08-29: qty 2

## 2015-08-29 MED ORDER — DIPHENHYDRAMINE HCL 50 MG/ML IJ SOLN
25.0000 mg | Freq: Once | INTRAMUSCULAR | Status: AC
  Administered 2015-08-29: 25 mg via INTRAMUSCULAR
  Filled 2015-08-29: qty 1

## 2015-08-29 MED ORDER — DIPHENHYDRAMINE HCL 25 MG PO CAPS
25.0000 mg | ORAL_CAPSULE | Freq: Once | ORAL | Status: AC
  Administered 2015-08-29: 25 mg via ORAL
  Filled 2015-08-29: qty 1

## 2015-08-29 MED ORDER — DEXTROSE-NACL 5-0.45 % IV SOLN
INTRAVENOUS | Status: DC
  Administered 2015-08-29: 14:00:00 via INTRAVENOUS

## 2015-08-29 MED ORDER — PROMETHAZINE HCL 25 MG/ML IJ SOLN
25.0000 mg | Freq: Once | INTRAMUSCULAR | Status: AC
  Administered 2015-08-29: 25 mg via INTRAVENOUS
  Filled 2015-08-29 (×2): qty 1

## 2015-08-29 NOTE — ED Notes (Addendum)
Pt was not in room and IV was found laying on the bed when this RN went to d/c patient.  It is assumed that he left.

## 2015-08-29 NOTE — Discharge Instructions (Signed)
Sickle Cell Anemia, Adult °Sickle cell anemia is a condition in which red blood cells have an abnormal "sickle" shape. This abnormal shape shortens the cells' life span, which results in a lower than normal concentration of red blood cells in the blood. The sickle shape also causes the cells to clump together and block free blood flow through the blood vessels. As a result, the tissues and organs of the body do not receive enough oxygen. Sickle cell anemia causes organ damage and pain and increases the risk of infection. °CAUSES  °Sickle cell anemia is a genetic disorder. Those who receive two copies of the gene have the condition, and those who receive one copy have the trait. °RISK FACTORS °The sickle cell gene is most common in people whose families originated in [place]. Other areas of the globe where sickle cell trait occurs include the [place], [place] and [place], the [place], and the [place].  °SIGNS AND SYMPTOMS °· Pain, especially in the extremities, back, chest, or abdomen (common). The pain may start suddenly or may develop following an illness, especially if there is dehydration. Pain can also occur due to overexertion or exposure to extreme temperature changes. °· Frequent severe bacterial infections, especially certain types of pneumonia and meningitis. °· Pain and swelling in the hands and feet. °· Decreased activity.   °· Loss of appetite.   °· Change in behavior. °· Headaches. °· Seizures. °· Shortness of breath or difficulty breathing. °· Vision changes. °· Skin ulcers. °Those with the trait may not have symptoms or they may have mild symptoms.  °DIAGNOSIS  °Sickle cell anemia is diagnosed with blood tests that demonstrate the genetic trait. It is often diagnosed during the newborn period, due to mandatory testing nationwide. A variety of blood tests, X-rays, CT scans, MRI scans, ultrasounds, and lung function tests may also be done to monitor the condition. °TREATMENT  °Sickle  cell anemia may be treated with: °· Medicines. You may be given pain medicines, antibiotic medicines (to treat and prevent infections) or medicines to increase the production of certain types of hemoglobin. °· Fluids. °· Oxygen. °· Blood transfusions. °HOME CARE INSTRUCTIONS  °· Drink enough fluid to keep your urine clear or pale yellow. Increase your fluid intake in hot weather and during exercise. °· Do not smoke. Smoking lowers oxygen levels in the blood.   °· Only take over-the-counter or prescription medicines for pain, fever, or discomfort as directed by your health care provider. °· Take antibiotics as directed by your health care provider. Make sure you finish them it even if you start to feel better.   °· Take supplements as directed by your health care provider.   °· Consider wearing a medical alert bracelet. This tells anyone caring for you in an emergency of your condition.   °· When traveling, keep your medical information, health care provider's names, and the medicines you take with you at all times.   °· If you develop a fever, do not take medicines to reduce the fever right away. This could cover up a problem that is developing. Notify your health care provider. °· Keep all follow-up appointments with your health care provider. Sickle cell anemia requires regular medical care. °SEEK MEDICAL CARE IF: ° You have a fever. °SEEK IMMEDIATE MEDICAL CARE IF:  °· You feel dizzy or faint.   °· You have new abdominal pain, especially on the left side near the stomach area.   °· You develop a persistent, often uncomfortable and painful penile erection (priapism). If this is not treated immediately it   will lead to impotence.   You have numbness your arms or legs or you have a hard time moving them.   You have a hard time with speech.   You have a fever or persistent symptoms for more than 2-3 days.   You have a fever and your symptoms suddenly get worse.   You have signs or symptoms of infection.  These include:   Chills.   Abnormal tiredness (lethargy).   Irritability.   Poor eating.   Vomiting.   You develop pain that is not helped with medicine.   You develop shortness of breath.  You have pain in your chest.   You are coughing up pus-like or bloody sputum.   You develop a stiff neck.  Your feet or hands swell or have pain.  Your abdomen appears bloated.  You develop joint pain. MAKE SURE YOU:  Understand these instructions.   This information is not intended to replace advice given to you by your health care provider. Make sure you discuss any questions you have with your health care provider.   Please follow up with your primary care provider for re-evaluation. Return to the ED if you experience severe worsening of your symptoms, chest pain, fever or cough.
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
## 2015-08-29 NOTE — ED Notes (Signed)
Pt presents to ED with c/o generalized body aches due to sickle cell crisis. Pt was seen yesterday for same complaint.

## 2015-08-30 ENCOUNTER — Encounter (HOSPITAL_COMMUNITY): Payer: Self-pay

## 2015-08-30 ENCOUNTER — Emergency Department (HOSPITAL_COMMUNITY)
Admission: EM | Admit: 2015-08-30 | Discharge: 2015-08-30 | Disposition: A | Discharge status: Home / Self Care | Payer: Medicaid Other | Attending: Emergency Medicine | Admitting: Emergency Medicine

## 2015-08-30 DIAGNOSIS — F17210 Nicotine dependence, cigarettes, uncomplicated: Secondary | ICD-10-CM | POA: Insufficient documentation

## 2015-08-30 DIAGNOSIS — M545 Low back pain: Secondary | ICD-10-CM | POA: Diagnosis present

## 2015-08-30 DIAGNOSIS — D5700 Hb-SS disease with crisis, unspecified: Secondary | ICD-10-CM | POA: Diagnosis not present

## 2015-08-30 MED ORDER — FOLIC ACID 1 MG PO TABS: 1.0000 mg | ORAL_TABLET | Freq: Every day | ORAL | Status: DC

## 2015-08-30 MED ORDER — FOLIC ACID 1 MG PO TABS
1.0000 mg | ORAL_TABLET | Freq: Once | ORAL | Status: AC
  Administered 2015-08-30: 1 mg via ORAL
  Filled 2015-08-30: qty 1

## 2015-08-30 MED ORDER — OXYCODONE HCL 5 MG PO TABS
30.0000 mg | ORAL_TABLET | Freq: Once | ORAL | Status: AC
  Administered 2015-08-30: 30 mg via ORAL
  Filled 2015-08-30: qty 6

## 2015-08-30 MED ORDER — OXYCODONE HCL 30 MG PO TABS: 30.0000 mg | ORAL_TABLET | ORAL | Status: DC | PRN

## 2015-08-30 MED ORDER — OXYCODONE HCL 30 MG PO TABS: 30.0000 mg | ORAL_TABLET | Freq: Three times a day (TID) | ORAL | Status: DC | PRN

## 2015-08-30 NOTE — ED Notes (Signed)
Pt. Reports having general sickle cell pain for th past 4 days.
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
## 2015-09-08 ENCOUNTER — Encounter (HOSPITAL_COMMUNITY): Payer: Self-pay | Admitting: Emergency Medicine

## 2015-09-08 ENCOUNTER — Emergency Department (HOSPITAL_COMMUNITY)
Admission: EM | Admit: 2015-09-08 | Discharge: 2015-09-08 | Disposition: A | Discharge status: Home / Self Care | Payer: Medicaid Other | Attending: Emergency Medicine | Admitting: Emergency Medicine

## 2015-09-08 DIAGNOSIS — D5700 Hb-SS disease with crisis, unspecified: Secondary | ICD-10-CM | POA: Diagnosis not present

## 2015-09-08 DIAGNOSIS — Z791 Long term (current) use of non-steroidal anti-inflammatories (NSAID): Secondary | ICD-10-CM | POA: Diagnosis not present

## 2015-09-08 DIAGNOSIS — Z79899 Other long term (current) drug therapy: Secondary | ICD-10-CM | POA: Insufficient documentation

## 2015-09-08 DIAGNOSIS — M79604 Pain in right leg: Secondary | ICD-10-CM | POA: Diagnosis not present

## 2015-09-08 DIAGNOSIS — F17210 Nicotine dependence, cigarettes, uncomplicated: Secondary | ICD-10-CM | POA: Diagnosis not present

## 2015-09-08 DIAGNOSIS — R112 Nausea with vomiting, unspecified: Secondary | ICD-10-CM | POA: Diagnosis not present

## 2015-09-08 DIAGNOSIS — M549 Dorsalgia, unspecified: Secondary | ICD-10-CM | POA: Diagnosis not present

## 2015-09-08 DIAGNOSIS — M79605 Pain in left leg: Secondary | ICD-10-CM | POA: Insufficient documentation

## 2015-09-08 LAB — BASIC METABOLIC PANEL
ANION GAP: 7 (ref 5–15)
BUN: 7 mg/dL (ref 6–20)
CALCIUM: 9.7 mg/dL (ref 8.9–10.3)
CO2: 26 mmol/L (ref 22–32)
Chloride: 104 mmol/L (ref 101–111)
Creatinine, Ser: 0.58 mg/dL — ABNORMAL LOW (ref 0.61–1.24)
GFR calc non Af Amer: >60 mL/min (ref >60)
Glucose, Bld: 78 mg/dL (ref 65–99)
Potassium: 4.4 mmol/L (ref 3.5–5.1)
SODIUM: 137 mmol/L (ref 135–145)

## 2015-09-08 LAB — RETICULOCYTES
RBC.: 2.52 MIL/uL — AB (ref 4.22–5.81)
Retic Count, Absolute: 307.4 10*3/uL — ABNORMAL HIGH (ref 19.0–186.0)
Retic Ct Pct: 12.2 % — ABNORMAL HIGH (ref 0.4–3.1)

## 2015-09-08 LAB — CBC WITH DIFFERENTIAL/PLATELET
BASOS PCT: 1 %
Basophils Absolute: 0.1 10*3/uL (ref 0.0–0.1)
Eosinophils Absolute: 0.3 10*3/uL (ref 0.0–0.7)
Eosinophils Relative: 2 %
HEMATOCRIT: 23.1 % — AB (ref 39.0–52.0)
HEMOGLOBIN: 8.2 g/dL — AB (ref 13.0–17.0)
LYMPHS ABS: 3.2 10*3/uL (ref 0.7–4.0)
Lymphocytes Relative: 19 %
MCH: 32.5 pg (ref 26.0–34.0)
MCHC: 35.5 g/dL (ref 30.0–36.0)
MCV: 91.7 fL (ref 78.0–100.0)
MONOS PCT: 11 %
Monocytes Absolute: 1.9 10*3/uL — ABNORMAL HIGH (ref 0.1–1.0)
NEUTROS ABS: 11.7 10*3/uL — AB (ref 1.7–7.7)
NEUTROS PCT: 67 %
Platelets: 334 10*3/uL (ref 150–400)
RBC: 2.52 MIL/uL — AB (ref 4.22–5.81)
RDW: 17.3 % — ABNORMAL HIGH (ref 11.5–15.5)
WBC: 17 10*3/uL — ABNORMAL HIGH (ref 4.0–10.5)

## 2015-09-08 MED ORDER — DIPHENHYDRAMINE HCL 50 MG/ML IJ SOLN
12.5000 mg | Freq: Once | INTRAMUSCULAR | Status: AC
Start: 2015-09-08 — End: 2015-09-08
  Administered 2015-09-08: 12.5 mg via INTRAVENOUS
  Filled 2015-09-08: qty 1

## 2015-09-08 MED ORDER — HYDROMORPHONE HCL 2 MG/ML IJ SOLN
3.0000 mg | Freq: Once | INTRAMUSCULAR | Status: AC
  Administered 2015-09-08: 3 mg via INTRAVENOUS
  Filled 2015-09-08: qty 2

## 2015-09-08 MED ORDER — DIPHENHYDRAMINE HCL 50 MG/ML IJ SOLN
25.0000 mg | Freq: Once | INTRAMUSCULAR | Status: AC
  Administered 2015-09-08: 25 mg via INTRAVENOUS
  Filled 2015-09-08: qty 1

## 2015-09-08 MED ORDER — PROMETHAZINE HCL 25 MG PO TABS
25.0000 mg | ORAL_TABLET | ORAL | Status: DC | PRN
  Administered 2015-09-08: 25 mg via ORAL
  Filled 2015-09-08: qty 1

## 2015-09-08 MED ORDER — DEXTROSE-NACL 5-0.45 % IV SOLN
INTRAVENOUS | Status: DC
  Administered 2015-09-08: 15:00:00 via INTRAVENOUS

## 2015-09-08 MED ORDER — OXYCODONE HCL 5 MG PO TABS
10.0000 mg | ORAL_TABLET | Freq: Once | ORAL | Status: AC
  Administered 2015-09-08: 10 mg via ORAL
  Filled 2015-09-08: qty 2

## 2015-09-08 NOTE — ED Provider Notes (Signed)
Patient signed out to me by Daphane Shepherd, PA-C.  Patient received 3rd dose of pain meds.  Feels improved.  Will give a dose of oxycodone prior to discharge.  Recommend PCP follow-up.  Patient has an upcoming appointment at the first of September.   Roxy Horseman, PA-C 09/08/15 1704    Doug Sou, MD 09/09/15 [phone]

## 2015-09-08 NOTE — ED Triage Notes (Signed)
Patient c/o sickle cell pain in back and bilat legs x 2 days with nausea but denies v/d.
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
## 2015-09-08 NOTE — ED Notes (Signed)
PA-C at bedside 

## 2015-09-08 NOTE — ED Provider Notes (Signed)
[place] of back pain and bilateral leg pain onset 4 days ago typical of sickle cell crisis he's had in the past pain onset 4 days ago. Patient reports he ran out of his pain medicine that he was prescribed on 09/09/2015. [employer] narcotics database queried. Patient received 100 oxycodone tablets on 08/30/2015   Doug Sou, MD 09/08/15 [phone]

## 2015-09-09 ENCOUNTER — Emergency Department (HOSPITAL_COMMUNITY)
Admission: EM | Admit: 2015-09-09 | Discharge: 2015-09-09 | Disposition: A | Discharge status: Home / Self Care | Payer: Medicaid Other | Attending: Emergency Medicine | Admitting: Emergency Medicine

## 2015-09-09 ENCOUNTER — Encounter (HOSPITAL_COMMUNITY): Payer: Self-pay | Admitting: Emergency Medicine

## 2015-09-09 DIAGNOSIS — F17210 Nicotine dependence, cigarettes, uncomplicated: Secondary | ICD-10-CM | POA: Insufficient documentation

## 2015-09-09 DIAGNOSIS — Z791 Long term (current) use of non-steroidal anti-inflammatories (NSAID): Secondary | ICD-10-CM | POA: Insufficient documentation

## 2015-09-09 DIAGNOSIS — D5700 Hb-SS disease with crisis, unspecified: Secondary | ICD-10-CM | POA: Diagnosis present

## 2015-09-09 LAB — CBC WITH DIFFERENTIAL/PLATELET
BASOS ABS: 0.1 10*3/uL (ref 0.0–0.1)
BASOS PCT: 0 %
Eosinophils Absolute: 0.5 10*3/uL (ref 0.0–0.7)
Eosinophils Relative: 3 %
HEMATOCRIT: 22.4 % — AB (ref 39.0–52.0)
HEMOGLOBIN: 8.1 g/dL — AB (ref 13.0–17.0)
LYMPHS PCT: 28 %
Lymphs Abs: 4.4 10*3/uL — ABNORMAL HIGH (ref 0.7–4.0)
MCH: 32.9 pg (ref 26.0–34.0)
MCHC: 36.2 g/dL — ABNORMAL HIGH (ref 30.0–36.0)
MCV: 91.1 fL (ref 78.0–100.0)
Monocytes Absolute: 2.1 10*3/uL — ABNORMAL HIGH (ref 0.1–1.0)
Monocytes Relative: 13 %
NEUTROS ABS: 8.8 10*3/uL — AB (ref 1.7–7.7)
NEUTROS PCT: 56 %
Platelets: 202 10*3/uL (ref 150–400)
RBC: 2.46 MIL/uL — AB (ref 4.22–5.81)
RDW: 17.6 % — AB (ref 11.5–15.5)
WBC: 15.9 10*3/uL — AB (ref 4.0–10.5)

## 2015-09-09 MED ORDER — OXYCODONE HCL 30 MG PO TABS: 30.0000 mg | ORAL_TABLET | Freq: Four times a day (QID) | ORAL | 0 refills | Status: DC | PRN

## 2015-09-09 MED ORDER — PROMETHAZINE HCL 25 MG/ML IJ SOLN
25.0000 mg | Freq: Once | INTRAMUSCULAR | Status: AC
  Administered 2015-09-09: 25 mg via INTRAVENOUS
  Filled 2015-09-09: qty 1

## 2015-09-09 MED ORDER — DIPHENHYDRAMINE HCL 50 MG/ML IJ SOLN
25.0000 mg | Freq: Once | INTRAMUSCULAR | Status: AC
  Administered 2015-09-09: 25 mg via INTRAVENOUS
  Filled 2015-09-09: qty 1

## 2015-09-09 MED ORDER — KETOROLAC TROMETHAMINE 30 MG/ML IJ SOLN
30.0000 mg | Freq: Once | INTRAMUSCULAR | Status: AC
  Administered 2015-09-09: 30 mg via INTRAVENOUS
  Filled 2015-09-09: qty 1

## 2015-09-09 MED ORDER — HYDROMORPHONE HCL 2 MG/ML IJ SOLN
3.0000 mg | Freq: Once | INTRAMUSCULAR | Status: AC
  Administered 2015-09-09: 3 mg via INTRAVENOUS
  Filled 2015-09-09: qty 2

## 2015-09-09 MED ORDER — SODIUM CHLORIDE 0.9 % IV BOLUS (SEPSIS)
1000.0000 mL | Freq: Once | INTRAVENOUS | Status: AC
  Administered 2015-09-09: 1000 mL via INTRAVENOUS

## 2015-09-09 NOTE — ED Notes (Signed)
PT DISCHARGED. INSTRUCTIONS AND PRESCRIPTION GIVEN. AAOX4. PT IN NO APPARENT DISTRESS OR PAIN. THE OPPORTUNITY TO ASK QUESTIONS WAS PROVIDED. 

## 2015-09-09 NOTE — Discharge Instructions (Signed)
Follow-up with your doctor since possible

## 2015-09-09 NOTE — ED Notes (Signed)
IV ATTEMPT X2 UNSUCCESSFUL.
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
## 2015-09-09 NOTE — ED Triage Notes (Addendum)
Pt complaint of SCC with associated abdominal, back, and leg pain for 3 days. Compliant of nausea.

## 2015-09-10 ENCOUNTER — Observation Stay (HOSPITAL_COMMUNITY)
Admission: EM | Admit: 2015-09-10 | Discharge: 2015-09-11 | Disposition: A | Discharge status: Home / Self Care | Payer: Medicaid Other | Attending: Internal Medicine | Admitting: Internal Medicine

## 2015-09-10 ENCOUNTER — Encounter (HOSPITAL_COMMUNITY): Payer: Self-pay | Admitting: *Deleted

## 2015-09-10 DIAGNOSIS — D5700 Hb-SS disease with crisis, unspecified: Principal | ICD-10-CM | POA: Diagnosis present

## 2015-09-10 DIAGNOSIS — F17200 Nicotine dependence, unspecified, uncomplicated: Secondary | ICD-10-CM | POA: Diagnosis not present

## 2015-09-10 DIAGNOSIS — R109 Unspecified abdominal pain: Secondary | ICD-10-CM | POA: Diagnosis present

## 2015-09-10 DIAGNOSIS — F17209 Nicotine dependence, unspecified, with unspecified nicotine-induced disorders: Secondary | ICD-10-CM | POA: Diagnosis present

## 2015-09-10 DIAGNOSIS — F17210 Nicotine dependence, cigarettes, uncomplicated: Secondary | ICD-10-CM | POA: Diagnosis not present

## 2015-09-10 DIAGNOSIS — D72829 Elevated white blood cell count, unspecified: Secondary | ICD-10-CM | POA: Diagnosis present

## 2015-09-10 LAB — COMPREHENSIVE METABOLIC PANEL
ALBUMIN: 4.5 g/dL (ref 3.5–5.0)
ALK PHOS: 71 U/L (ref 38–126)
ALT: 11 U/L — ABNORMAL LOW (ref 17–63)
AST: 25 U/L (ref 15–41)
Anion gap: 7 (ref 5–15)
BILIRUBIN TOTAL: 3.9 mg/dL — AB (ref 0.3–1.2)
BUN: 7 mg/dL (ref 6–20)
CALCIUM: 9.1 mg/dL (ref 8.9–10.3)
CO2: 23 mmol/L (ref 22–32)
Chloride: 109 mmol/L (ref 101–111)
Creatinine, Ser: 0.64 mg/dL (ref 0.61–1.24)
GFR calc Af Amer: >60 mL/min (ref >60)
GLUCOSE: 110 mg/dL — AB (ref 65–99)
Potassium: 3.9 mmol/L (ref 3.5–5.1)
Sodium: 139 mmol/L (ref 135–145)
TOTAL PROTEIN: 7.6 g/dL (ref 6.5–8.1)

## 2015-09-10 LAB — CBC WITH DIFFERENTIAL/PLATELET
BASOS ABS: 0.1 10*3/uL (ref 0.0–0.1)
BASOS PCT: 1 %
Eosinophils Absolute: 0.5 10*3/uL (ref 0.0–0.7)
Eosinophils Relative: 3 %
HEMATOCRIT: 19.7 % — AB (ref 39.0–52.0)
HEMOGLOBIN: 6.9 g/dL — AB (ref 13.0–17.0)
LYMPHS PCT: 26 %
Lymphs Abs: 4.3 10*3/uL — ABNORMAL HIGH (ref 0.7–4.0)
MCH: 31.9 pg (ref 26.0–34.0)
MCHC: 35 g/dL (ref 30.0–36.0)
MCV: 91.2 fL (ref 78.0–100.0)
MONO ABS: 2 10*3/uL — AB (ref 0.1–1.0)
MONOS PCT: 12 %
NEUTROS ABS: 9.7 10*3/uL — AB (ref 1.7–7.7)
NEUTROS PCT: 59 %
Platelets: 288 10*3/uL (ref 150–400)
RBC: 2.16 MIL/uL — ABNORMAL LOW (ref 4.22–5.81)
RDW: 18.3 % — AB (ref 11.5–15.5)
WBC: 16.5 10*3/uL — ABNORMAL HIGH (ref 4.0–10.5)

## 2015-09-10 LAB — RETICULOCYTES
RBC.: 2.16 MIL/uL — AB (ref 4.22–5.81)
Retic Count, Absolute: 308.9 10*3/uL — ABNORMAL HIGH (ref 19.0–186.0)
Retic Ct Pct: 14.3 % — ABNORMAL HIGH (ref 0.4–3.1)

## 2015-09-10 LAB — PREPARE RBC (CROSSMATCH)

## 2015-09-10 MED ORDER — DIPHENHYDRAMINE HCL 25 MG PO CAPS
25.0000 mg | ORAL_CAPSULE | ORAL | Status: DC | PRN
  Administered 2015-09-11: 50 mg via ORAL
  Filled 2015-09-10: qty 2

## 2015-09-10 MED ORDER — PROMETHAZINE HCL 25 MG PO TABS
12.5000 mg | ORAL_TABLET | ORAL | Status: DC | PRN
  Administered 2015-09-10: 25 mg via ORAL
  Filled 2015-09-10: qty 1

## 2015-09-10 MED ORDER — HYDROMORPHONE HCL 2 MG/ML IJ SOLN
2.0000 mg | INTRAMUSCULAR | Status: DC | PRN
  Administered 2015-09-10 – 2015-09-11 (×13): 2 mg via INTRAVENOUS
  Filled 2015-09-10 (×13): qty 1

## 2015-09-10 MED ORDER — SODIUM CHLORIDE 0.9 % IV SOLN: Freq: Once | INTRAVENOUS | Status: DC

## 2015-09-10 MED ORDER — HYDROXYUREA 500 MG PO CAPS
1000.0000 mg | ORAL_CAPSULE | Freq: Every morning | ORAL | Status: DC
  Administered 2015-09-11: 1000 mg via ORAL
  Filled 2015-09-10: qty 2

## 2015-09-10 MED ORDER — KETOROLAC TROMETHAMINE 30 MG/ML IJ SOLN
30.0000 mg | Freq: Four times a day (QID) | INTRAMUSCULAR | Status: DC
  Administered 2015-09-10 – 2015-09-11 (×3): 30 mg via INTRAVENOUS
  Filled 2015-09-10 (×3): qty 1

## 2015-09-10 MED ORDER — PROMETHAZINE HCL 25 MG/ML IJ SOLN
25.0000 mg | Freq: Once | INTRAMUSCULAR | Status: AC
  Administered 2015-09-10: 25 mg via INTRAVENOUS
  Filled 2015-09-10 (×2): qty 1

## 2015-09-10 MED ORDER — PROMETHAZINE HCL 12.5 MG RE SUPP
12.5000 mg | RECTAL | Status: DC | PRN
Start: 2015-09-10 — End: 2015-09-11
  Filled 2015-09-10: qty 2

## 2015-09-10 MED ORDER — SENNOSIDES-DOCUSATE SODIUM 8.6-50 MG PO TABS
1.0000 | ORAL_TABLET | Freq: Two times a day (BID) | ORAL | Status: DC
  Administered 2015-09-10 – 2015-09-11 (×2): 1 via ORAL
  Filled 2015-09-10 (×2): qty 1

## 2015-09-10 MED ORDER — DEXTROSE-NACL 5-0.45 % IV SOLN
INTRAVENOUS | Status: DC
  Administered 2015-09-10: 17:00:00 via INTRAVENOUS

## 2015-09-10 MED ORDER — DIPHENHYDRAMINE HCL 50 MG/ML IJ SOLN
25.0000 mg | INTRAMUSCULAR | Status: DC | PRN
  Administered 2015-09-10: 25 mg via INTRAVENOUS
  Filled 2015-09-10 (×3): qty 0.5

## 2015-09-10 MED ORDER — FOLIC ACID 1 MG PO TABS
1.0000 mg | ORAL_TABLET | Freq: Every morning | ORAL | Status: DC
  Administered 2015-09-11: 1 mg via ORAL
  Filled 2015-09-10: qty 1

## 2015-09-10 MED ORDER — POLYETHYLENE GLYCOL 3350 17 G PO PACK: 17.0000 g | PACK | Freq: Every day | ORAL | Status: DC | PRN

## 2015-09-10 MED ORDER — DIPHENHYDRAMINE HCL 25 MG PO CAPS
50.0000 mg | ORAL_CAPSULE | Freq: Once | ORAL | Status: AC
Start: 2015-09-10 — End: 2015-09-10
  Administered 2015-09-10: 50 mg via ORAL
  Filled 2015-09-10: qty 2

## 2015-09-10 MED ORDER — NICOTINE 21 MG/24HR TD PT24
21.0000 mg | MEDICATED_PATCH | Freq: Every day | TRANSDERMAL | Status: DC
  Administered 2015-09-10: 21 mg via TRANSDERMAL
  Filled 2015-09-10: qty 1

## 2015-09-10 MED ORDER — KETOROLAC TROMETHAMINE 30 MG/ML IJ SOLN
30.0000 mg | Freq: Once | INTRAMUSCULAR | Status: AC
  Administered 2015-09-10: 30 mg via INTRAVENOUS
  Filled 2015-09-10: qty 1

## 2015-09-10 MED ORDER — DEXTROSE-NACL 5-0.45 % IV SOLN
INTRAVENOUS | Status: AC
  Administered 2015-09-11: 03:00:00 via INTRAVENOUS

## 2015-09-10 MED ORDER — HYDROMORPHONE HCL 2 MG/ML IJ SOLN
3.0000 mg | INTRAMUSCULAR | Status: AC
  Administered 2015-09-10 (×3): 3 mg via INTRAVENOUS
  Filled 2015-09-10 (×3): qty 2
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
## 2015-09-10 NOTE — ED Triage Notes (Signed)
Pt with hx of sickle cell disease complains of pain to his legs, back and abdomen for the past 4 days. Pt also complains of nausea and lightheadedness.

## 2015-09-10 NOTE — ED Notes (Signed)
Phone report given to [place], [place]
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
## 2015-09-10 NOTE — ED Notes (Signed)
hgb 6.9

## 2015-09-11 DIAGNOSIS — D5700 Hb-SS disease with crisis, unspecified: Secondary | ICD-10-CM | POA: Diagnosis not present

## 2015-09-11 LAB — TYPE AND SCREEN
ABO/RH(D): O POS
Antibody Screen: NEGATIVE
DONOR AG TYPE: NEGATIVE
Unit division: 0

## 2015-09-11 LAB — HEMOGLOBIN AND HEMATOCRIT, BLOOD
HEMATOCRIT: 22.7 % — AB (ref 39.0–52.0)
HEMOGLOBIN: 8.1 g/dL — AB (ref 13.0–17.0)

## 2015-09-11 MED ORDER — ENSURE ENLIVE PO LIQD: 237.0000 mL | Freq: Two times a day (BID) | ORAL | Status: DC

## 2015-09-11 MED ORDER — NALOXONE HCL 0.4 MG/ML IJ SOLN: 0.4000 mg | INTRAMUSCULAR | Status: DC | PRN

## 2015-09-11 MED ORDER — ONDANSETRON HCL 4 MG/2ML IJ SOLN: 4.0000 mg | Freq: Four times a day (QID) | INTRAMUSCULAR | Status: DC | PRN

## 2015-09-11 MED ORDER — OXYCODONE HCL 5 MG PO TABS
30.0000 mg | ORAL_TABLET | ORAL | Status: DC
  Administered 2015-09-11: 30 mg via ORAL
  Filled 2015-09-11 (×2): qty 6

## 2015-09-11 MED ORDER — SODIUM CHLORIDE 0.9 % IV SOLN
25.0000 mg | INTRAVENOUS | Status: DC | PRN
  Filled 2015-09-11: qty 0.5

## 2015-09-11 MED ORDER — SODIUM CHLORIDE 0.9% FLUSH: 9.0000 mL | INTRAVENOUS | Status: DC | PRN

## 2015-09-11 MED ORDER — HYDROMORPHONE 1 MG/ML IV SOLN: INTRAVENOUS | Status: DC

## 2015-09-11 MED ORDER — DIPHENHYDRAMINE HCL 25 MG PO CAPS: 25.0000 mg | ORAL_CAPSULE | ORAL | Status: DC | PRN

## 2015-09-11 MED ORDER — HYDROMORPHONE HCL 2 MG/ML IJ SOLN: 2.0000 mg | INTRAMUSCULAR | Status: DC | PRN

## 2015-09-11 MED FILL — oxyCODONE HCL 30 MG TABS: 30 | 7 days supply | Qty: 30 | Fill #0

## 2015-09-11 NOTE — Progress Notes (Addendum)
Pt given Goodrx coupon for Roxicodone and phone number for Department of Social Services to have his out of [place] Medicaid switched over to [facility].   Sandford Craze RN,BSN,NCM [phone]

## 2015-09-11 NOTE — Progress Notes (Signed)
PHARMACIST - PHYSICIAN COMMUNICATION  Key Points: Use following P&T approved IV to PO diphenhydramine (Benadryl) policy. Description contains the criteria that are approved  DR:   Ashley Royalty CONCERNING: IV to Oral Route Change Policy  RECOMMENDATION: This patient is receiving diphenhydramine by the intravenous route.  Based on criteria approved by the Pharmacy and Therapeutics Committee, intravenous diphenhydramine is being converted to the equivalent oral dose form(s).   DESCRIPTION: These criteria include:  Diphenhydramine is not prescribed to treat or prevent a severe allergic reaction  Diphenhydramine is not prescribed as premedication prior to receiving blood product, biologic medication, antimicrobial, or chemotherapy agent  The patient has tolerated at least one dose of an oral or enteral medication  The patient has no evidence of active gastrointestinal bleeding or impaired GI absorption (gastrectomy, short bowel, patient on TNA or NPO).  The patient is not undergoing procedural sedation   If you have questions about this conversion, please contact the Pharmacy Department  [x]   [phone] )  [facility] PharmD, [place] Pager [phone] 09/11/2015 1:22 PM

## 2015-09-11 NOTE — Discharge Summary (Signed)
Matthew Shannon MRN: [id number] DOB/AGE: Apr 19, 1990 25 y.o.  Admit date: 09/10/2015 Discharge date: 09/11/2015  Primary Care Physician:  Elinor Dodge, MD   Discharge Diagnoses:   Patient Active Problem List   Diagnosis Date Noted  . Abscess of palate   . Left facial swelling 08/21/2015  . Infected dental caries   . Tobacco dependence 07/24/2015  . Sickle cell anemia with crisis (HCC) 07/09/2015  . Exercise hypoxemia 06/14/2015  . Sickle cell disease with crisis (HCC) 06/14/2015  . Chronic pain 05/21/2015  . Microcytic anemia   . EKG abnormalities   . Eczema   . Sickle-cell disease with pain (HCC) 04/19/2015  . Anemia of chronic disease   . Leukocytosis 01/24/2015  . Sickle cell anemia with pain (HCC) 01/09/2015  . Dental infection 11/17/2014  . Chest pain 11/17/2014  . Anemia 10/09/2014  . Sickle cell crisis (HCC) 10/09/2014  . Tachycardia 09/26/2014  . Sickle cell pain crisis (HCC) 09/24/2014  . Community acquired pneumonia 09/24/2014  . Tobacco abuse 09/24/2014  . CAP (community acquired pneumonia) 09/24/2014    DISCHARGE MEDICATION:   Medication List    TAKE these medications   amoxicillin-clavulanate 875-125 MG tablet Commonly known as:  AUGMENTIN Take 1 tablet by mouth every 12 (twelve) hours.   folic acid 1 MG tablet Commonly known as:  FOLVITE Take 1 tablet (1 mg total) by mouth daily. What changed:  when to take this   hydroxyurea 500 MG capsule Commonly known as:  HYDREA Take 2 capsules (1,000 mg total) by mouth daily. May take with food to minimize GI side effects. What changed:  when to take this  additional instructions   ibuprofen 200 MG tablet Commonly known as:  ADVIL,MOTRIN Take 200 mg by mouth every 6 (six) hours as needed for moderate pain.   oxycodone 30 MG immediate release tablet Commonly known as:  ROXICODONE Take 1 tablet (30 mg total) by mouth every 6 (six) hours as needed for pain. What changed:  Another medication with  the same name was removed. Continue taking this medication, and follow the directions you see here.         Consults:    SIGNIFICANT DIAGNOSTIC STUDIES:  Dg Chest 2 View  Result Date: 08/19/2015 CLINICAL DATA:  Smoker, sickle cell disease, pain EXAM: CHEST  2 VIEW COMPARISON:  08/01/2015 FINDINGS: Enlargement of cardiac silhouette with pulmonary vascular congestion. Mediastinal contours normal. Increased atelectasis RIGHT base. Chronic accentuation of perihilar markings particularly on RIGHT, unchanged. No definite acute infiltrate, pleural effusion or pneumothorax. Minimal chronic peribronchial thickening. Dextroconvex thoracic scoliosis. IMPRESSION: Scattered chronic lung changes with increased RIGHT basilar atelectasis. Enlargement of cardiac silhouette with pulmonary vascular congestion. Electronically Signed   By: Ulyses Southward M.D.   On: 08/19/2015 08:56   Ct Maxillofacial W/cm  Result Date: 08/21/2015 CLINICAL DATA:  Left facial swelling EXAM: CT MAXILLOFACIAL WITH CONTRAST TECHNIQUE: Multidetector CT imaging of the maxillofacial structures was performed with intravenous contrast. Multiplanar CT image reconstructions were also generated. A small metallic BB was placed on the right temple in order to reliably differentiate right from left. CONTRAST:  75mL ISOVUE-300 IOPAMIDOL (ISOVUE-300) INJECTION 61% COMPARISON:  11/16/2014 FINDINGS: There again expansive changes in the bony structures consistent with the given clinical history of sickle cell disease. Advanced tooth decay is again identified with progression when compare with the prior exam particularly in the mandibular molars bilaterally. No acute fracture is identified. The periapical lucency seen in the left mandible on the prior exam are  stable in appearance. Mild soft tissue swelling is noted on the left although no definitive abscess is seen. The overall appearance appears mildly improved when compared with prior exam. Mild mucosal  thickening within the paranasal sinuses is noted. The visualized intracranial structures are unremarkable. The orbits and their contents are within normal limits. IMPRESSION: Advanced dental decay progressed from the prior exam. Stable changes of sickle cell disease. Left facial swelling without definitive abscess. This appears somewhat improved when compared with the prior exam. Electronically Signed   By: Alcide Clever M.D.   On: 08/21/2015 10:07      No results found for this or any previous visit (from the past 240 hour(s)).  BRIEF ADMITTING H & P: Matthew Shannon is a 25 y.o. male with medical history significant for sickle cell anemia with frequent crises who presents the emergency department with pain in the abdomen, back, and bilateral legs for the past 4 days. Patient has been seen in the emergency department the past 3 days in a row with similar complaints and has improved with administration of analgesics in the emergency department and has not required admission. He reports the insidious development of deep, achy, severe pain in the mid to low back, abdomen, and bilateral lower extremities approximately 4 days ago. He initially tried to manage the symptoms with his oxycodone IR at home, but was unsuccessful and eventually ran out of his home medications. He denies any recent fevers, chills, nausea, vomiting, or diarrhea. There has been no chest pain, palpitations, dyspnea or cough. Patient also denies dysuria, suprapubic pain, or flank pain. There has been no headache, vision or hearing change, loss of coordination, or focal numbness or weakness. He reports the current symptoms are consistent with his prior SCD pain crises.   ED Course: Upon arrival to the ED, patient is found to be afebrile, saturating well on room air, and with vital signs stable. Chemistry panel features a total bilirubin of 3.9, stable relative to prior measurements. CBC is notable for a leukocytosis to 16,500 and a hemoglobin  of 6.9, down from an apparent baseline of 7.5-8. Type and screen was performed and 1 unit of packed red blood cells was ordered for immediate transfusion in the emergency department. Patient was treated with 30 mg Toradol, Phenergan, Benadryl, and 3 mg IV Dilaudid 3. Despite these measures, he continued to complain of intolerable pain, and will be observed on the medical-surgical unit for ongoing evaluation and management of sickle cell disease with pain crisis and acute on chronic anemia.   Hospital Course:  Present on Admission: Matthew Shannon was admitted by Dr. Antionette Char for sickle cell crisis and acute in chronic anemia. However per his records and per patient's own admission, his baseline Hb is 6.5 and on admission it was 6.9 prior to admission and he had no documented sings or symptoms of acute anemia. Nevertheless he received a transfusion of 1 unit RBC and at the time of discharge his Hb was 8.1 which is above 2 g/dl above his baseline. With regard to his pain crisis, he was treated with intermittent Dilaudid by IVP and no oral analgesics. Patient has indicated that he has a prescription written for him in the ED by Dr. Aileen Pilot (He presented the prescription in hand). However, he is unable to get it filled at the Pharmacy and will likely ne coming to the ED every day to be treated for pain as he has no pain medications at home. At the time of discharge his  pain is well controlled.      Disposition and Follow-up: Pt discharged home in good condition. Unfortunately he has chronic pain associated with his Sickle Cell Disease and currently has no access to health care except through the ED. He is without any mediations for pain and will likely be I the ED on a continuous basis until the time arrives when the Pharmacy will fill the prescription. Case Management in trying to assist patient with obtaining medications.   DISCHARGE EXAM:  General: Alert, awake, oriented x3, in mild distress.  HEENT: [place]/AT  PEERL, EOMI, anicteric Neck: Trachea midline, no masses, no thyromegal,y no JVD, no carotid bruit OROPHARYNX: Moist, No exudate/ erythema/lesions.  Heart: Regular rate and rhythm, without murmurs, rubs, gallops or S3. PMI non-displaced. Exam reveals no decreased pulses. Pulmonary/Chest: Normal effort. Breath sounds normal. No. Apnea. Clear to auscultation,no stridor,  no wheezing and no rhonchi noted. No respiratory distress and no tenderness noted. Abdomen: Soft, nontender, nondistended, normal bowel sounds, no masses no hepatosplenomegaly noted. No fluid wave and no ascites. There is no guarding or rebound. Neuro: Alert and oriented to person, place and time. Normal motor skills, Displays no atrophy or tremors and exhibits normal muscle tone.  No focal neurological deficits noted cranial nerves II through XII grossly intact. No sensory deficit noted.Strength at baseline in bilateral upper and lower extremities. Gait normal. Musculoskeletal: No warm swelling or erythema around joints, no spinal tenderness noted. Psychiatric: Patient alert and oriented x3, good insight and cognition, good recent to remote recall. Mood, memory, affect and judgement normal Lymph node survey: No cervical axillary or inguinal lymphadenopathy noted. Skin: Skin is warm and dry. No bruising, no ecchymosis and no rash noted. Pt is not diaphoretic. No erythema. No pallor    Blood pressure 100/64, pulse 73, temperature 98.1 F (36.7 C), temperature source Oral, resp. rate 16, height 5\' 10"  (1.778 m), weight 55.4 kg (122 lb 2.2 oz), SpO2 100 %.   Recent Labs  09/08/15 1453 09/10/15 1754  NA 137 139  K 4.4 3.9  CL 104 109  CO2 26 23  GLUCOSE 78 110*  BUN 7 7  CREATININE 0.58* 0.64  CALCIUM 9.7 9.1    Recent Labs  09/10/15 1754  AST 25  ALT 11*  ALKPHOS 71  BILITOT 3.9*  PROT 7.6  ALBUMIN 4.5   No results for input(s): LIPASE, AMYLASE in the last 72 hours.  Recent Labs  09/09/15 1720  09/10/15 1754 09/11/15 0629  WBC 15.9* 16.5*  --   NEUTROABS 8.8* 9.7*  --   HGB 8.1* 6.9* 8.1*  HCT 22.4* 19.7* 22.7*  MCV 91.1 91.2  --   PLT 202 288  --      Total time spent including face to face and decision making was greater than 30 minutes  Signed: Gibran Veselka A. 09/11/2015, 1:52 PM

## 2015-09-11 NOTE — Progress Notes (Signed)
Initial Nutrition Assessment  DOCUMENTATION CODES:   Underweight  INTERVENTION:   Provide Ensure Enlive po BID, each supplement provides 350 kcal and 20 grams of protein Encourage PO intake RD to continue to monitor  NUTRITION DIAGNOSIS:   Underweight related to chronic illness as evidenced by other (see comment) (BMI of 17.6).  GOAL:   Patient will meet greater than or equal to 90% of their needs  MONITOR:   PO intake, Supplement acceptance, Labs, Weight trends, I & O's  REASON FOR ASSESSMENT:   Other (Comment) (Low BMI)    ASSESSMENT:   25 y.o. male with medical history significant for sickle cell anemia with frequent crises who presents the emergency department with pain in the abdomen, back, and bilateral legs for the past 4 days. Patient has been seen in the emergency department the past 3 days in a row with similar complaints and has improved with administration of analgesics in the emergency department and has not required admission. He reports the insidious development of deep, achy, severe pain in the mid to low back, abdomen, and bilateral lower extremities approximately 4 days ago. He initially tried to manage the symptoms with his oxycodone IR at home, but was unsuccessful and eventually ran out of his home medications.  Patient reports eating "decent". No appetite changes, states he eats mostly at night. Usually does not eat breakfast. Pt states his appetite is better at night. He is currently eating 100% of meals. Denies any mouth pain or chewing issues (noted palate abscess in chart). Pt would like nutritional supplements, will order Ensure. Provided examples of more affordable protein options for after discharge.  Per weight history, pt has lost 13 lb but question time frame. Pt states his UBW is between 125-130 lb.  Nutrition focused physical exam shows no sign of depletion of muscle mass or body fat.  Labs reviewed. Medications: Folic acid tablet daily  Diet  Order:  Diet regular Room service appropriate? Yes; Fluid consistency: Thin  Skin:  Reviewed, no issues  Last BM:  7/13  Height:   Ht Readings from Last 1 Encounters:  09/10/15 5\' 10"  (1.778 m)    Weight:   Wt Readings from Last 1 Encounters:  09/10/15 122 lb 2.2 oz (55.4 kg)    Ideal Body Weight:  75.5 kg  BMI:  Body mass index is 17.52 kg/m.  Estimated Nutritional Needs:   Kcal:  1650-1850  Protein:  80-90g  Fluid:  1.9L/day  EDUCATION NEEDS:   No education needs identified at this time  Tilda Franco, MS, RD, LDN Pager: [phone] After Hours Pager: [phone]

## 2015-09-17 ENCOUNTER — Emergency Department (HOSPITAL_COMMUNITY)
Admission: EM | Admit: 2015-09-17 | Discharge: 2015-09-17 | Disposition: A | Discharge status: Home / Self Care | Payer: Medicaid Other | Attending: Emergency Medicine | Admitting: Emergency Medicine

## 2015-09-17 DIAGNOSIS — Z791 Long term (current) use of non-steroidal anti-inflammatories (NSAID): Secondary | ICD-10-CM | POA: Diagnosis not present

## 2015-09-17 DIAGNOSIS — D5700 Hb-SS disease with crisis, unspecified: Secondary | ICD-10-CM | POA: Diagnosis present

## 2015-09-17 DIAGNOSIS — F17210 Nicotine dependence, cigarettes, uncomplicated: Secondary | ICD-10-CM | POA: Diagnosis not present

## 2015-09-17 DIAGNOSIS — G8929 Other chronic pain: Secondary | ICD-10-CM | POA: Diagnosis not present

## 2015-09-17 DIAGNOSIS — Z79899 Other long term (current) drug therapy: Secondary | ICD-10-CM | POA: Insufficient documentation

## 2015-09-17 LAB — CBC WITH DIFFERENTIAL/PLATELET
Basophils Absolute: 0 10*3/uL (ref 0.0–0.1)
Basophils Relative: 0 %
EOS PCT: 3 %
Eosinophils Absolute: 0.4 10*3/uL (ref 0.0–0.7)
HCT: 24.9 % — ABNORMAL LOW (ref 39.0–52.0)
Hemoglobin: 8.6 g/dL — ABNORMAL LOW (ref 13.0–17.0)
LYMPHS ABS: 2.8 10*3/uL (ref 0.7–4.0)
Lymphocytes Relative: 22 %
MCH: 31.5 pg (ref 26.0–34.0)
MCHC: 34.5 g/dL (ref 30.0–36.0)
MCV: 91.2 fL (ref 78.0–100.0)
MONO ABS: 1.5 10*3/uL — AB (ref 0.1–1.0)
Monocytes Relative: 12 %
Neutro Abs: 7.9 10*3/uL — ABNORMAL HIGH (ref 1.7–7.7)
Neutrophils Relative %: 63 %
PLATELETS: 336 10*3/uL (ref 150–400)
RBC: 2.73 MIL/uL — AB (ref 4.22–5.81)
RDW: 16.8 % — AB (ref 11.5–15.5)
WBC: 12.6 10*3/uL — AB (ref 4.0–10.5)

## 2015-09-17 LAB — COMPREHENSIVE METABOLIC PANEL
ALT: 20 U/L (ref 17–63)
AST: 33 U/L (ref 15–41)
Albumin: 4.9 g/dL (ref 3.5–5.0)
Alkaline Phosphatase: 80 U/L (ref 38–126)
Anion gap: 7 (ref 5–15)
BILIRUBIN TOTAL: 3.5 mg/dL — AB (ref 0.3–1.2)
BUN: 7 mg/dL (ref 6–20)
CHLORIDE: 104 mmol/L (ref 101–111)
CO2: 27 mmol/L (ref 22–32)
CREATININE: 0.53 mg/dL — AB (ref 0.61–1.24)
Calcium: 9.7 mg/dL (ref 8.9–10.3)
GFR calc Af Amer: >60 mL/min (ref >60)
Glucose, Bld: 110 mg/dL — ABNORMAL HIGH (ref 65–99)
Potassium: 3.8 mmol/L (ref 3.5–5.1)
Sodium: 138 mmol/L (ref 135–145)
TOTAL PROTEIN: 9 g/dL — AB (ref 6.5–8.1)

## 2015-09-17 MED ORDER — HYDROMORPHONE HCL 2 MG/ML IJ SOLN
1.5000 mg | Freq: Once | INTRAMUSCULAR | Status: AC
  Administered 2015-09-17: 1.5 mg via INTRAVENOUS
  Filled 2015-09-17: qty 1

## 2015-09-17 MED ORDER — HYDROMORPHONE HCL 2 MG/ML IJ SOLN
INTRAMUSCULAR | Status: AC
  Filled 2015-09-17: qty 1

## 2015-09-17 MED ORDER — HYDROMORPHONE HCL 1 MG/ML IJ SOLN
1.0000 mg | Freq: Once | INTRAMUSCULAR | Status: AC
  Administered 2015-09-17: 1 mg via INTRAVENOUS
  Filled 2015-09-17: qty 1

## 2015-09-17 MED ORDER — OXYCODONE HCL 30 MG PO TABS: 30.0000 mg | ORAL_TABLET | Freq: Four times a day (QID) | ORAL | 0 refills | Status: DC | PRN

## 2015-09-17 MED ORDER — PROMETHAZINE HCL 25 MG/ML IJ SOLN
25.0000 mg | Freq: Once | INTRAMUSCULAR | Status: AC
  Administered 2015-09-17: 25 mg via INTRAVENOUS
  Filled 2015-09-17: qty 1

## 2015-09-17 MED ORDER — DIPHENHYDRAMINE HCL 50 MG/ML IJ SOLN
25.0000 mg | Freq: Once | INTRAMUSCULAR | Status: AC
  Administered 2015-09-17: 25 mg via INTRAVENOUS
  Filled 2015-09-17: qty 1

## 2015-09-17 MED ORDER — FENTANYL CITRATE (PF) 100 MCG/2ML IJ SOLN
100.0000 ug | Freq: Once | INTRAMUSCULAR | Status: AC
  Administered 2015-09-17: 100 ug via INTRAVENOUS
  Filled 2015-09-17: qty 2

## 2015-09-17 NOTE — ED Triage Notes (Signed)
Pt with hx of Sickle Cell disease c/o back pain, nausea, abdominal pain, and lightheadedness x 2 days. Back pain is consistent with typical sickle cell pain, other symptoms are not, per pt.
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
## 2015-09-24 ENCOUNTER — Emergency Department (HOSPITAL_COMMUNITY)
Admission: EM | Admit: 2015-09-24 | Discharge: 2015-09-24 | Disposition: A | Discharge status: Home / Self Care | Payer: Medicaid Other | Source: Home / Self Care | Attending: Emergency Medicine | Admitting: Emergency Medicine

## 2015-09-24 ENCOUNTER — Encounter (HOSPITAL_COMMUNITY): Payer: Self-pay

## 2015-09-24 ENCOUNTER — Emergency Department (HOSPITAL_COMMUNITY)
Admission: EM | Admit: 2015-09-24 | Discharge: 2015-09-24 | Disposition: A | Discharge status: Home / Self Care | Payer: Medicaid Other | Attending: Emergency Medicine | Admitting: Emergency Medicine

## 2015-09-24 DIAGNOSIS — Z79899 Other long term (current) drug therapy: Secondary | ICD-10-CM | POA: Diagnosis not present

## 2015-09-24 DIAGNOSIS — M549 Dorsalgia, unspecified: Secondary | ICD-10-CM

## 2015-09-24 DIAGNOSIS — F17210 Nicotine dependence, cigarettes, uncomplicated: Secondary | ICD-10-CM | POA: Insufficient documentation

## 2015-09-24 DIAGNOSIS — D5700 Hb-SS disease with crisis, unspecified: Secondary | ICD-10-CM | POA: Insufficient documentation

## 2015-09-24 DIAGNOSIS — G8929 Other chronic pain: Secondary | ICD-10-CM | POA: Insufficient documentation

## 2015-09-24 DIAGNOSIS — Z791 Long term (current) use of non-steroidal anti-inflammatories (NSAID): Secondary | ICD-10-CM

## 2015-09-24 LAB — CBC WITH DIFFERENTIAL/PLATELET
BASOS ABS: 0.1 10*3/uL (ref 0.0–0.1)
BASOS PCT: 1 %
EOS PCT: 2 %
Eosinophils Absolute: 0.4 10*3/uL (ref 0.0–0.7)
HEMATOCRIT: 24.1 % — AB (ref 39.0–52.0)
Hemoglobin: 8.1 g/dL — ABNORMAL LOW (ref 13.0–17.0)
Lymphocytes Relative: 18 %
Lymphs Abs: 2.8 10*3/uL (ref 0.7–4.0)
MCH: 29.8 pg (ref 26.0–34.0)
MCHC: 33.6 g/dL (ref 30.0–36.0)
MCV: 88.6 fL (ref 78.0–100.0)
MONO ABS: 1.6 10*3/uL — AB (ref 0.1–1.0)
MONOS PCT: 10 %
Neutro Abs: 10.6 10*3/uL — ABNORMAL HIGH (ref 1.7–7.7)
Neutrophils Relative %: 69 %
PLATELETS: 442 10*3/uL — AB (ref 150–400)
RBC: 2.72 MIL/uL — ABNORMAL LOW (ref 4.22–5.81)
RDW: 16.8 % — AB (ref 11.5–15.5)
WBC: 15.4 10*3/uL — ABNORMAL HIGH (ref 4.0–10.5)

## 2015-09-24 LAB — COMPREHENSIVE METABOLIC PANEL
ALBUMIN: 4.9 g/dL (ref 3.5–5.0)
ALK PHOS: 93 U/L (ref 38–126)
ALT: 22 U/L (ref 17–63)
AST: 31 U/L (ref 15–41)
Anion gap: 7 (ref 5–15)
BILIRUBIN TOTAL: 3.6 mg/dL — AB (ref 0.3–1.2)
BUN: 5 mg/dL — AB (ref 6–20)
CALCIUM: 9.5 mg/dL (ref 8.9–10.3)
CO2: 27 mmol/L (ref 22–32)
Chloride: 102 mmol/L (ref 101–111)
Creatinine, Ser: 0.5 mg/dL — ABNORMAL LOW (ref 0.61–1.24)
GFR calc Af Amer: >60 mL/min (ref >60)
GFR calc non Af Amer: >60 mL/min (ref >60)
GLUCOSE: 77 mg/dL (ref 65–99)
Potassium: 3.9 mmol/L (ref 3.5–5.1)
SODIUM: 136 mmol/L (ref 135–145)
TOTAL PROTEIN: 9.1 g/dL — AB (ref 6.5–8.1)

## 2015-09-24 LAB — RETICULOCYTES
RBC.: 2.74 MIL/uL — AB (ref 4.22–5.81)
Retic Count, Absolute: 293.2 10*3/uL — ABNORMAL HIGH (ref 19.0–186.0)
Retic Ct Pct: 10.7 % — ABNORMAL HIGH (ref 0.4–3.1)

## 2015-09-24 MED ORDER — DEXTROSE-NACL 5-0.45 % IV SOLN
INTRAVENOUS | Status: DC
  Administered 2015-09-24: 1 mL via INTRAVENOUS

## 2015-09-24 MED ORDER — ONDANSETRON HCL 4 MG/2ML IJ SOLN
4.0000 mg | INTRAMUSCULAR | Status: DC | PRN
  Administered 2015-09-24: 4 mg via INTRAVENOUS
  Filled 2015-09-24 (×2): qty 2

## 2015-09-24 MED ORDER — PROMETHAZINE HCL 25 MG/ML IJ SOLN
25.0000 mg | Freq: Once | INTRAMUSCULAR | Status: DC
  Filled 2015-09-24: qty 1

## 2015-09-24 MED ORDER — HYDROMORPHONE HCL 2 MG/ML IJ SOLN
2.0000 mg | INTRAMUSCULAR | Status: AC
  Administered 2015-09-24 (×2): 2 mg via INTRAVENOUS
  Filled 2015-09-24 (×3): qty 1

## 2015-09-24 MED ORDER — OXYCODONE HCL 5 MG PO TABS
30.0000 mg | ORAL_TABLET | Freq: Once | ORAL | Status: AC
  Administered 2015-09-24: 30 mg via ORAL
  Filled 2015-09-24: qty 6

## 2015-09-24 MED ORDER — DIPHENHYDRAMINE HCL 50 MG/ML IJ SOLN
INTRAMUSCULAR | Status: AC
  Administered 2015-09-24: 25 mg
  Filled 2015-09-24: qty 1

## 2015-09-24 MED ORDER — DIPHENHYDRAMINE HCL 25 MG PO CAPS
25.0000 mg | ORAL_CAPSULE | ORAL | Status: DC | PRN
  Administered 2015-09-24: 25 mg via ORAL
  Filled 2015-09-24: qty 1

## 2015-09-24 MED ORDER — KETOROLAC TROMETHAMINE 30 MG/ML IJ SOLN
30.0000 mg | Freq: Once | INTRAMUSCULAR | Status: AC
  Administered 2015-09-24: 30 mg via INTRAVENOUS
  Filled 2015-09-24: qty 1

## 2015-09-24 MED ORDER — CYCLOBENZAPRINE HCL 10 MG PO TABS: 10.0000 mg | ORAL_TABLET | Freq: Two times a day (BID) | ORAL | 0 refills | Status: DC | PRN

## 2015-09-24 MED ORDER — HYDROMORPHONE HCL 2 MG PO TABS
2.0000 mg | ORAL_TABLET | Freq: Once | ORAL | Status: AC
  Administered 2015-09-24: 2 mg via ORAL
  Filled 2015-09-24: qty 1

## 2015-09-24 NOTE — Discharge Instructions (Signed)
You need to have a primary care doctor manage the pain.

## 2015-09-24 NOTE — ED Triage Notes (Signed)
Pt presents with c/o sickle cell pain in both of his legs, back, and his stomach. Pt reports he also feels nauseated. Pt reports the pain started approx 2 days ago.
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
## 2015-09-24 NOTE — ED Notes (Signed)
Patient left before discharge instructions reviewed.
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
## 2015-09-24 NOTE — ED Triage Notes (Signed)
Pt presents with c/o sickle cell pain in his back, legs, and stomach. Pt was here earlier today for same and left before receiving his discharge papers.

## 2015-09-25 ENCOUNTER — Emergency Department (HOSPITAL_COMMUNITY)
Admission: EM | Admit: 2015-09-25 | Discharge: 2015-09-25 | Disposition: A | Discharge status: Home / Self Care | Payer: Medicaid Other | Attending: Emergency Medicine | Admitting: Emergency Medicine

## 2015-09-25 ENCOUNTER — Encounter (HOSPITAL_COMMUNITY): Payer: Self-pay | Admitting: Emergency Medicine

## 2015-09-25 DIAGNOSIS — G8929 Other chronic pain: Secondary | ICD-10-CM | POA: Diagnosis not present

## 2015-09-25 DIAGNOSIS — D571 Sickle-cell disease without crisis: Secondary | ICD-10-CM | POA: Diagnosis not present

## 2015-09-25 DIAGNOSIS — D5700 Hb-SS disease with crisis, unspecified: Secondary | ICD-10-CM

## 2015-09-25 DIAGNOSIS — F17210 Nicotine dependence, cigarettes, uncomplicated: Secondary | ICD-10-CM | POA: Insufficient documentation

## 2015-09-25 DIAGNOSIS — Z791 Long term (current) use of non-steroidal anti-inflammatories (NSAID): Secondary | ICD-10-CM | POA: Diagnosis not present

## 2015-09-25 DIAGNOSIS — M549 Dorsalgia, unspecified: Secondary | ICD-10-CM | POA: Diagnosis present

## 2015-09-25 LAB — COMPREHENSIVE METABOLIC PANEL
ALBUMIN: 5 g/dL (ref 3.5–5.0)
ALK PHOS: 94 U/L (ref 38–126)
ALT: 20 U/L (ref 17–63)
AST: 27 U/L (ref 15–41)
Anion gap: 6 (ref 5–15)
BUN: 7 mg/dL (ref 6–20)
CALCIUM: 9.6 mg/dL (ref 8.9–10.3)
CHLORIDE: 105 mmol/L (ref 101–111)
CO2: 26 mmol/L (ref 22–32)
CREATININE: 0.49 mg/dL — AB (ref 0.61–1.24)
GFR calc non Af Amer: >60 mL/min (ref >60)
GLUCOSE: 83 mg/dL (ref 65–99)
Potassium: 4.1 mmol/L (ref 3.5–5.1)
SODIUM: 137 mmol/L (ref 135–145)
Total Bilirubin: 3.8 mg/dL — ABNORMAL HIGH (ref 0.3–1.2)
Total Protein: 9.3 g/dL — ABNORMAL HIGH (ref 6.5–8.1)

## 2015-09-25 LAB — CBC WITH DIFFERENTIAL/PLATELET
BASOS PCT: 0 %
Basophils Absolute: 0 10*3/uL (ref 0.0–0.1)
EOS ABS: 0.4 10*3/uL (ref 0.0–0.7)
EOS PCT: 2 %
HCT: 25.2 % — ABNORMAL LOW (ref 39.0–52.0)
Hemoglobin: 8.7 g/dL — ABNORMAL LOW (ref 13.0–17.0)
LYMPHS ABS: 3.6 10*3/uL (ref 0.7–4.0)
Lymphocytes Relative: 20 %
MCH: 30.7 pg (ref 26.0–34.0)
MCHC: 34.5 g/dL (ref 30.0–36.0)
MCV: 89 fL (ref 78.0–100.0)
MONO ABS: 1.6 10*3/uL — AB (ref 0.1–1.0)
Monocytes Relative: 9 %
Neutro Abs: 12.4 10*3/uL — ABNORMAL HIGH (ref 1.7–7.7)
Neutrophils Relative %: 69 %
Platelets: 489 10*3/uL — ABNORMAL HIGH (ref 150–400)
RBC: 2.83 MIL/uL — ABNORMAL LOW (ref 4.22–5.81)
RDW: 16.9 % — ABNORMAL HIGH (ref 11.5–15.5)
WBC: 18 10*3/uL — ABNORMAL HIGH (ref 4.0–10.5)

## 2015-09-25 MED ORDER — OXYCODONE HCL 5 MG PO TABS
30.0000 mg | ORAL_TABLET | Freq: Once | ORAL | Status: AC
  Administered 2015-09-25: 30 mg via ORAL
  Filled 2015-09-25: qty 6

## 2015-09-25 MED ORDER — OXYCODONE HCL 30 MG PO TABS: 30.0000 mg | ORAL_TABLET | Freq: Four times a day (QID) | ORAL | 0 refills | Status: DC | PRN

## 2015-09-25 MED ORDER — HYDROXYUREA 500 MG PO CAPS: 1000.0000 mg | ORAL_CAPSULE | Freq: Every day | ORAL | 0 refills | Status: DC

## 2015-09-25 MED FILL — oxyCODONE HCL 30 MG TABS: 30 | 3 days supply | Qty: 15 | Fill #0

## 2015-09-25 NOTE — Discharge Instructions (Signed)
Your insurance continues to show out-of-state. You will need to change this to in-[place], before [place] [place] [place].  We are going to give you a few days of the pain medicine, to help give you time to change your insurance, and see a medical doctor.

## 2015-09-25 NOTE — Progress Notes (Signed)
Entered in d/c instructions   Matthew DodgeFernando A Sanchez-Brugal, MD PCP - General Internal Medicine [phone] [address] [place] [address]   Next Steps: Go on 10/15/2015  Instructions: You have a scheduled 10/15/15 8 am appt to establish new patient care with Dr Earney NavySanchez-Brugal when [facility] called the office today PLEASE GO TO THIS APPOINTMENT

## 2015-09-25 NOTE — Progress Notes (Addendum)
Pt with 50 ED visits This is visit #2 this week and 7 admission (last admission 09/10/15 to 09/11/15)  Pt continues with medicaid [place] coverage  Pt [facility] [place] [place] [facility] [place] [facility] [phone] stated pt appt was 08/29/2015 3 pm with Dr Mordecai Maessanchez  7/19 1410 CM [facility] to [facility] pt had a pre surg appt at 1400 on 08/28/15 but [facility]  Pt was scheduled on 7/201/7 at 0715 for dental surgery-extractions with Dr Priscille Heidelberg Kulinski per [facility]  Pt states his pcp is Elinor DodgeFernando A Sanchez-Brugal, [facility]  ED Cm consulted by EDP Effie ShyWentz there is no CM interventions available for this pt He stil needs to change his insurance coverage  [facility] ED [facility]

## 2015-09-25 NOTE — Progress Notes (Signed)
Sp[facility] with Duway[facility]ielle [facility] to confirm pt has an appt on 10/15/15 at 8 am as a "new patient to establish care"  Cm discussed with her that pt had told [facility] he had an appt in July 2017  She states pt did have appt in July 2017 but pt rescheduled the July appt per Duwayne Heckanielle
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
## 2015-09-25 NOTE — ED Notes (Signed)
Denies SOB or cp.

## 2015-09-25 NOTE — ED Triage Notes (Signed)
Pt c/o sickle cell pain in back and stomach. Pt denies chest pain or SOB. Pt sts this episode has lasted 4 days. Pt c/o nausea, denies vomiting or diarrhea.

## 2015-09-25 NOTE — ED Notes (Signed)
Patients wants to wait until he gets his IV to collect blood

## 2015-09-27 ENCOUNTER — Emergency Department (HOSPITAL_COMMUNITY)
Admission: EM | Admit: 2015-09-27 | Discharge: 2015-09-27 | Disposition: A | Discharge status: Home / Self Care | Payer: Medicaid Other | Attending: Emergency Medicine | Admitting: Emergency Medicine

## 2015-09-27 ENCOUNTER — Encounter (HOSPITAL_COMMUNITY): Payer: Self-pay

## 2015-09-27 DIAGNOSIS — D5700 Hb-SS disease with crisis, unspecified: Secondary | ICD-10-CM | POA: Insufficient documentation

## 2015-09-27 DIAGNOSIS — Z791 Long term (current) use of non-steroidal anti-inflammatories (NSAID): Secondary | ICD-10-CM | POA: Diagnosis not present

## 2015-09-27 DIAGNOSIS — M545 Low back pain: Secondary | ICD-10-CM | POA: Diagnosis present

## 2015-09-27 DIAGNOSIS — F17210 Nicotine dependence, cigarettes, uncomplicated: Secondary | ICD-10-CM | POA: Diagnosis not present

## 2015-09-27 LAB — CBC WITH DIFFERENTIAL/PLATELET
Basophils Absolute: 0.1 10*3/uL (ref 0.0–0.1)
Basophils Relative: 1 %
Eosinophils Absolute: 0.2 10*3/uL (ref 0.0–0.7)
Eosinophils Relative: 1 %
HCT: 24.2 % — ABNORMAL LOW (ref 39.0–52.0)
Hemoglobin: 8.6 g/dL — ABNORMAL LOW (ref 13.0–17.0)
Lymphocytes Relative: 16 %
Lymphs Abs: 2.4 10*3/uL (ref 0.7–4.0)
MCH: 31.7 pg (ref 26.0–34.0)
MCHC: 35.5 g/dL (ref 30.0–36.0)
MCV: 89.3 fL (ref 78.0–100.0)
Monocytes Absolute: 1.2 10*3/uL — ABNORMAL HIGH (ref 0.1–1.0)
Monocytes Relative: 8 %
Neutro Abs: 11.4 10*3/uL — ABNORMAL HIGH (ref 1.7–7.7)
Neutrophils Relative %: 74 %
Platelets: 497 10*3/uL — ABNORMAL HIGH (ref 150–400)
RBC: 2.71 MIL/uL — ABNORMAL LOW (ref 4.22–5.81)
RDW: 17.2 % — ABNORMAL HIGH (ref 11.5–15.5)
WBC: 15.1 10*3/uL — ABNORMAL HIGH (ref 4.0–10.5)

## 2015-09-27 LAB — COMPREHENSIVE METABOLIC PANEL
ALT: 15 U/L — ABNORMAL LOW (ref 17–63)
AST: 25 U/L (ref 15–41)
Albumin: 4.9 g/dL (ref 3.5–5.0)
Alkaline Phosphatase: 87 U/L (ref 38–126)
Anion gap: 7 (ref 5–15)
BUN: 7 mg/dL (ref 6–20)
CO2: 26 mmol/L (ref 22–32)
Calcium: 9.9 mg/dL (ref 8.9–10.3)
Chloride: 105 mmol/L (ref 101–111)
Creatinine, Ser: 0.42 mg/dL — ABNORMAL LOW (ref 0.61–1.24)
GFR calc Af Amer: >60 mL/min (ref >60)
GFR calc non Af Amer: >60 mL/min (ref >60)
Glucose, Bld: 91 mg/dL (ref 65–99)
Potassium: 4 mmol/L (ref 3.5–5.1)
Sodium: 138 mmol/L (ref 135–145)
Total Bilirubin: 3.6 mg/dL — ABNORMAL HIGH (ref 0.3–1.2)
Total Protein: 9.2 g/dL — ABNORMAL HIGH (ref 6.5–8.1)

## 2015-09-27 LAB — RETICULOCYTES
RBC.: 2.71 MIL/uL — ABNORMAL LOW (ref 4.22–5.81)
Retic Count, Absolute: 341.5 10*3/uL — ABNORMAL HIGH (ref 19.0–186.0)
Retic Ct Pct: 12.6 % — ABNORMAL HIGH (ref 0.4–3.1)

## 2015-09-27 MED ORDER — DIPHENHYDRAMINE HCL 50 MG/ML IJ SOLN
25.0000 mg | INTRAMUSCULAR | Status: AC | PRN
  Administered 2015-09-27 (×2): 25 mg via INTRAVENOUS
  Filled 2015-09-27 (×2): qty 1

## 2015-09-27 MED ORDER — KETOROLAC TROMETHAMINE 15 MG/ML IJ SOLN
15.0000 mg | Freq: Once | INTRAMUSCULAR | Status: AC
  Administered 2015-09-27: 15 mg via INTRAVENOUS
  Filled 2015-09-27: qty 1

## 2015-09-27 MED ORDER — OXYCODONE HCL 30 MG PO TABS: 30.0000 mg | ORAL_TABLET | Freq: Four times a day (QID) | ORAL | 0 refills | Status: DC | PRN

## 2015-09-27 MED ORDER — HYDROMORPHONE HCL 2 MG/ML IJ SOLN
2.0000 mg | INTRAMUSCULAR | Status: AC | PRN
  Administered 2015-09-27 (×3): 2 mg via INTRAVENOUS
  Filled 2015-09-27 (×3): qty 1

## 2015-09-27 MED ORDER — SODIUM CHLORIDE 0.9 % IV SOLN
INTRAVENOUS | Status: DC
  Administered 2015-09-27: 11:00:00 via INTRAVENOUS

## 2015-09-27 MED FILL — oxyCODONE HCL 30 MG TABS: 30 | 15 days supply | Qty: 60 | Fill #0

## 2015-09-27 NOTE — ED Notes (Signed)
Info PT he is not allow to turn monitor off

## 2015-09-27 NOTE — ED Triage Notes (Signed)
C/o sickle cell pain to back and legs onset 3 days ago, progressively getting worse, rates pain at 8/10, reports nausea and "feeling lightheaded".
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
## 2015-09-27 NOTE — ED Notes (Signed)
Patient c/o low back and bilateral leg pain since earlier today, which is typical of his [place] pain.

## 2015-10-02 ENCOUNTER — Emergency Department (HOSPITAL_COMMUNITY)
Admission: EM | Admit: 2015-10-02 | Discharge: 2015-10-02 | Disposition: A | Discharge status: Home / Self Care | Payer: Medicaid Other | Source: Home / Self Care | Attending: Emergency Medicine | Admitting: Emergency Medicine

## 2015-10-02 ENCOUNTER — Emergency Department (HOSPITAL_COMMUNITY)
Admission: EM | Admit: 2015-10-02 | Discharge: 2015-10-03 | Disposition: A | Discharge status: Home / Self Care | Payer: Medicaid Other | Attending: Emergency Medicine | Admitting: Emergency Medicine

## 2015-10-02 ENCOUNTER — Encounter (HOSPITAL_COMMUNITY): Payer: Self-pay | Admitting: Emergency Medicine

## 2015-10-02 DIAGNOSIS — M542 Cervicalgia: Secondary | ICD-10-CM | POA: Diagnosis present

## 2015-10-02 DIAGNOSIS — Z7289 Other problems related to lifestyle: Secondary | ICD-10-CM | POA: Insufficient documentation

## 2015-10-02 DIAGNOSIS — Z79899 Other long term (current) drug therapy: Secondary | ICD-10-CM

## 2015-10-02 DIAGNOSIS — G894 Chronic pain syndrome: Secondary | ICD-10-CM | POA: Insufficient documentation

## 2015-10-02 DIAGNOSIS — F17210 Nicotine dependence, cigarettes, uncomplicated: Secondary | ICD-10-CM

## 2015-10-02 DIAGNOSIS — D5700 Hb-SS disease with crisis, unspecified: Secondary | ICD-10-CM

## 2015-10-02 DIAGNOSIS — Z791 Long term (current) use of non-steroidal anti-inflammatories (NSAID): Secondary | ICD-10-CM | POA: Insufficient documentation

## 2015-10-02 DIAGNOSIS — Z765 Malingerer [conscious simulation]: Secondary | ICD-10-CM

## 2015-10-02 LAB — CBC WITH DIFFERENTIAL/PLATELET
Basophils Absolute: 0.1 10*3/uL (ref 0.0–0.1)
Basophils Relative: 1 %
EOS PCT: 3 %
Eosinophils Absolute: 0.5 10*3/uL (ref 0.0–0.7)
HCT: 20.6 % — ABNORMAL LOW (ref 39.0–52.0)
Hemoglobin: 7.4 g/dL — ABNORMAL LOW (ref 13.0–17.0)
LYMPHS ABS: 5.1 10*3/uL — AB (ref 0.7–4.0)
LYMPHS PCT: 35 %
MCH: 31.8 pg (ref 26.0–34.0)
MCHC: 35.9 g/dL (ref 30.0–36.0)
MCV: 88.4 fL (ref 78.0–100.0)
MONO ABS: 1.8 10*3/uL — AB (ref 0.1–1.0)
MONOS PCT: 12 %
Neutro Abs: 7.1 10*3/uL (ref 1.7–7.7)
Neutrophils Relative %: 49 %
PLATELETS: 386 10*3/uL (ref 150–400)
RBC: 2.33 MIL/uL — ABNORMAL LOW (ref 4.22–5.81)
RDW: 18 % — AB (ref 11.5–15.5)
WBC: 14.5 10*3/uL — ABNORMAL HIGH (ref 4.0–10.5)

## 2015-10-02 LAB — COMPREHENSIVE METABOLIC PANEL
ALBUMIN: 4.2 g/dL (ref 3.5–5.0)
ALT: 11 U/L — ABNORMAL LOW (ref 17–63)
AST: 30 U/L (ref 15–41)
Alkaline Phosphatase: 72 U/L (ref 38–126)
Anion gap: 6 (ref 5–15)
BILIRUBIN TOTAL: 3.6 mg/dL — AB (ref 0.3–1.2)
BUN: 10 mg/dL (ref 6–20)
CHLORIDE: 107 mmol/L (ref 101–111)
CO2: 25 mmol/L (ref 22–32)
Calcium: 9.3 mg/dL (ref 8.9–10.3)
Creatinine, Ser: 0.53 mg/dL — ABNORMAL LOW (ref 0.61–1.24)
GFR calc Af Amer: >60 mL/min (ref >60)
GFR calc non Af Amer: >60 mL/min (ref >60)
GLUCOSE: 86 mg/dL (ref 65–99)
POTASSIUM: 3.6 mmol/L (ref 3.5–5.1)
Sodium: 138 mmol/L (ref 135–145)
Total Protein: 8 g/dL (ref 6.5–8.1)

## 2015-10-02 LAB — RETICULOCYTES
RBC.: 2.34 MIL/uL — ABNORMAL LOW (ref 4.22–5.81)
Retic Count, Absolute: 320.6 10*3/uL — ABNORMAL HIGH (ref 19.0–186.0)
Retic Ct Pct: 13.7 % — ABNORMAL HIGH (ref 0.4–3.1)

## 2015-10-02 LAB — LACTATE DEHYDROGENASE: LDH: 268 U/L — ABNORMAL HIGH (ref 98–192)

## 2015-10-02 MED ORDER — OXYCODONE HCL 5 MG PO TABS
30.0000 mg | ORAL_TABLET | Freq: Once | ORAL | Status: AC
  Administered 2015-10-02: 30 mg via ORAL
  Filled 2015-10-02: qty 6

## 2015-10-02 MED ORDER — HYDROMORPHONE HCL 2 MG/ML IJ SOLN
2.0000 mg | Freq: Once | INTRAMUSCULAR | Status: DC
  Filled 2015-10-02: qty 1

## 2015-10-02 MED ORDER — DIPHENHYDRAMINE HCL 25 MG PO CAPS
25.0000 mg | ORAL_CAPSULE | Freq: Once | ORAL | Status: AC
  Administered 2015-10-02: 25 mg via ORAL
  Filled 2015-10-02: qty 1

## 2015-10-02 MED ORDER — SODIUM CHLORIDE 0.9 % IV SOLN: Freq: Once | INTRAVENOUS | Status: DC

## 2015-10-02 MED ORDER — KETOROLAC TROMETHAMINE 30 MG/ML IJ SOLN
30.0000 mg | Freq: Once | INTRAMUSCULAR | Status: AC
  Administered 2015-10-02: 30 mg via INTRAMUSCULAR
  Filled 2015-10-02: qty 1

## 2015-10-02 MED ORDER — PROMETHAZINE HCL 25 MG/ML IJ SOLN
25.0000 mg | Freq: Once | INTRAMUSCULAR | Status: AC
  Administered 2015-10-02: 25 mg via INTRAMUSCULAR
  Filled 2015-10-02: qty 1

## 2015-10-02 NOTE — ED Triage Notes (Signed)
Patient here with complaints of sickle cell pain to back and legs. Getting worse, no relief from home meds. Pain 8/10. Nausea. Denies chest pain and SOB.

## 2015-10-02 NOTE — ED Triage Notes (Signed)
Pt c/o sickle cell pain in back and bilateral legs. Pt was seen here and treated earlier today for the same. Pt sts pain has gotten worse since he left. Pt A&OX4 and ambulatory. Denies chest pain, SOB, dizziness.

## 2015-10-02 NOTE — Discharge Instructions (Signed)
Follow-up with primary care doctor for management of your chronic pain.

## 2015-10-02 NOTE — ED Notes (Signed)
Bed: WA02 Expected date:  Expected time:  Means of arrival:  Comments: triage 

## 2015-10-02 NOTE — Progress Notes (Addendum)
Pt with 50 ED visits This is visit #1 this week and 7 admission (last admission 09/10/15 to 09/11/15)  Pt continues with medicaid of [place] coverage  Pt is informing [facility] staff [facility] [place] is Dr Mordecai MaesSanchez - THIS IS NOT TRUE   This ED CM verified on 09/25/15 with Gabby at [facility] [phone] stated pt appt was 08/29/2015 3 pm with Dr Mordecai Maessanchez but he cancelled  7/19 1410 CM [facility] to [facility] had a pre surg appt at 1400 on 08/28/15 but [facility]   Pt was scheduled on 7/201/7 at 0715 for dental surgery-extractions with Dr [facility] but came to ED x 2 on 03/01/15   10/02/15 1446 ED CM spoke with [place] [facility] to check on pt pcp f/u appt on  [place] confirms pt has not YET established care with Dr Mordecai MaesSanchez Had 5 new pt appts and pt has cancelled all of them (canced for 08/29/15, 09/27/15, 09/30/15, 10/14/15 and 10/15/15) [place] states pt recently walked in the office o cancel the 10/15/15 and informed them "my money isn't right yet"  [place] confirms the out of pocket expense for and uninsured pt (he is uninsured because he has not changed [facility] [place] [facility] is $250 and this is not covering labs, imaging, etc    1451 EDP, [place] updated that pt has not f/u pcp appt Pt had informed EDP he had a follow up appt coming with sanchez on 10/15/15 THIS IS NOT TRUE ED CM removed [facility]    There are not any CM interventions available for this pt He stil needs to change his insurance coverage  No ED CP [facility] on 09/25/15

## 2015-10-02 NOTE — ED Notes (Signed)
Pt sts he would like to wait to have blood drawn until he can get IV and pain medication.
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
## 2015-10-02 NOTE — ED Notes (Signed)
Patient requesting more pain medication and benadryl for itching.

## 2015-10-03 ENCOUNTER — Encounter (HOSPITAL_COMMUNITY): Payer: Self-pay | Admitting: *Deleted

## 2015-10-03 ENCOUNTER — Emergency Department (HOSPITAL_COMMUNITY)
Admission: EM | Admit: 2015-10-03 | Discharge: 2015-10-03 | Disposition: A | Discharge status: Home / Self Care | Payer: Medicaid Other | Attending: Emergency Medicine | Admitting: Emergency Medicine

## 2015-10-03 ENCOUNTER — Telehealth: Payer: Self-pay | Admitting: *Deleted

## 2015-10-03 ENCOUNTER — Emergency Department (HOSPITAL_COMMUNITY)
Admission: EM | Admit: 2015-10-03 | Discharge: 2015-10-03 | Disposition: A | Discharge status: Home / Self Care | Payer: Medicaid Other

## 2015-10-03 DIAGNOSIS — G8929 Other chronic pain: Secondary | ICD-10-CM | POA: Insufficient documentation

## 2015-10-03 DIAGNOSIS — D5700 Hb-SS disease with crisis, unspecified: Secondary | ICD-10-CM | POA: Insufficient documentation

## 2015-10-03 DIAGNOSIS — Z791 Long term (current) use of non-steroidal anti-inflammatories (NSAID): Secondary | ICD-10-CM | POA: Diagnosis not present

## 2015-10-03 DIAGNOSIS — F17210 Nicotine dependence, cigarettes, uncomplicated: Secondary | ICD-10-CM | POA: Insufficient documentation

## 2015-10-03 DIAGNOSIS — Z79899 Other long term (current) drug therapy: Secondary | ICD-10-CM | POA: Insufficient documentation

## 2015-10-03 DIAGNOSIS — M545 Low back pain: Secondary | ICD-10-CM | POA: Diagnosis present

## 2015-10-03 MED ORDER — OXYCODONE HCL 5 MG PO TABS
30.0000 mg | ORAL_TABLET | Freq: Once | ORAL | Status: AC
  Administered 2015-10-03: 30 mg via ORAL
  Filled 2015-10-03: qty 6

## 2015-10-03 MED ORDER — HYDROMORPHONE HCL 2 MG/ML IJ SOLN: 0.0313 mg/kg | INTRAMUSCULAR | Status: AC

## 2015-10-03 MED ORDER — HYDROMORPHONE HCL 2 MG/ML IJ SOLN: 0.0375 mg/kg | INTRAMUSCULAR | Status: AC

## 2015-10-03 MED ORDER — HYDROMORPHONE HCL 2 MG/ML IJ SOLN: 0.0250 mg/kg | INTRAMUSCULAR | Status: AC

## 2015-10-03 MED ORDER — DIPHENHYDRAMINE HCL 25 MG PO CAPS
25.0000 mg | ORAL_CAPSULE | ORAL | Status: DC | PRN
  Administered 2015-10-03: 25 mg via ORAL
  Filled 2015-10-03: qty 1

## 2015-10-03 MED ORDER — KETOROLAC TROMETHAMINE 15 MG/ML IJ SOLN
15.0000 mg | INTRAMUSCULAR | Status: AC
  Administered 2015-10-03: 15 mg via INTRAVENOUS
  Filled 2015-10-03: qty 1

## 2015-10-03 MED ORDER — HYDROMORPHONE HCL 2 MG/ML IJ SOLN
0.0250 mg/kg | INTRAMUSCULAR | Status: AC
  Administered 2015-10-03: 1.5 mg via INTRAVENOUS
  Filled 2015-10-03: qty 1

## 2015-10-03 MED ORDER — HYDROMORPHONE HCL 2 MG/ML IJ SOLN
0.0313 mg/kg | INTRAMUSCULAR | Status: AC
  Administered 2015-10-03: 1.9 mg via INTRAVENOUS
  Filled 2015-10-03: qty 1

## 2015-10-03 MED ORDER — HYDROMORPHONE HCL 2 MG/ML IJ SOLN
0.0375 mg/kg | INTRAMUSCULAR | Status: AC
  Administered 2015-10-03: 2.3 mg via INTRAVENOUS
  Filled 2015-10-03: qty 2

## 2015-10-03 MED ORDER — SODIUM CHLORIDE 0.45 % IV SOLN
INTRAVENOUS | Status: DC
  Administered 2015-10-03: 01:00:00 via INTRAVENOUS
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
## 2015-10-03 NOTE — ED Notes (Signed)
liu MD at bedside.

## 2015-10-03 NOTE — ED Triage Notes (Signed)
This is pt's 3rd visit in less than 24hrs for sickle cell pain; pt states that he has back and bilateral leg pain; pt states that the pain is getting worse since he was discharged; pt denies chest pain, SHOB, or dizziness

## 2015-10-03 NOTE — ED Notes (Signed)
MD at bedside. 

## 2015-10-03 NOTE — ED Notes (Signed)
PER MARTHA CHARGE RN, PUT PATIENT IN CONSULTATION ROOM, SHE WOULD BE COMING UP TO SEE PATIENT.

## 2015-10-03 NOTE — ED Notes (Signed)
Pt getting dressed. Requests sandwich and sprite.

## 2015-10-03 NOTE — Progress Notes (Signed)
Charge RN stated patient wanted to speak with social work.  Spoke with patient who states he wanted prescriptions until he receives his insurance on Sept. 5th. Patient states he has no PCP and he has not heard of the [facility]. Patient states he has an appointment [facility]th [facility] with Dr. Mordecai MaesSanchez.   Staffed with Consulting civil engineerCharge RN, EDP, and ED CM. Patient will not be given prescriptions from the ED.   Elenore PaddyLaVonia Sharissa Brierley, LCSWA [phone] ED CSW 10/03/2015 3:47 PM

## 2015-10-07 ENCOUNTER — Encounter (HOSPITAL_COMMUNITY): Payer: Self-pay | Admitting: Emergency Medicine

## 2015-10-07 ENCOUNTER — Emergency Department (HOSPITAL_COMMUNITY)
Admission: EM | Admit: 2015-10-07 | Discharge: 2015-10-07 | Disposition: A | Discharge status: Home / Self Care | Payer: Medicaid Other | Source: Home / Self Care | Attending: Emergency Medicine | Admitting: Emergency Medicine

## 2015-10-07 DIAGNOSIS — G8929 Other chronic pain: Secondary | ICD-10-CM

## 2015-10-07 DIAGNOSIS — F17210 Nicotine dependence, cigarettes, uncomplicated: Secondary | ICD-10-CM | POA: Insufficient documentation

## 2015-10-07 DIAGNOSIS — M79604 Pain in right leg: Secondary | ICD-10-CM | POA: Insufficient documentation

## 2015-10-07 DIAGNOSIS — M79605 Pain in left leg: Secondary | ICD-10-CM | POA: Diagnosis not present

## 2015-10-07 DIAGNOSIS — Z79899 Other long term (current) drug therapy: Secondary | ICD-10-CM | POA: Insufficient documentation

## 2015-10-07 DIAGNOSIS — Z7289 Other problems related to lifestyle: Secondary | ICD-10-CM | POA: Insufficient documentation

## 2015-10-07 DIAGNOSIS — M549 Dorsalgia, unspecified: Secondary | ICD-10-CM | POA: Insufficient documentation

## 2015-10-07 DIAGNOSIS — R109 Unspecified abdominal pain: Secondary | ICD-10-CM | POA: Diagnosis present

## 2015-10-07 DIAGNOSIS — R110 Nausea: Secondary | ICD-10-CM

## 2015-10-07 DIAGNOSIS — D5700 Hb-SS disease with crisis, unspecified: Secondary | ICD-10-CM | POA: Insufficient documentation

## 2015-10-07 LAB — CBC WITH DIFFERENTIAL/PLATELET
BASOS ABS: 0 10*3/uL (ref 0.0–0.1)
Basophils Relative: 0 %
EOS PCT: 3 %
Eosinophils Absolute: 0.4 10*3/uL (ref 0.0–0.7)
HEMATOCRIT: 24 % — AB (ref 39.0–52.0)
Hemoglobin: 8.4 g/dL — ABNORMAL LOW (ref 13.0–17.0)
LYMPHS ABS: 3.1 10*3/uL (ref 0.7–4.0)
LYMPHS PCT: 22 %
MCH: 30.3 pg (ref 26.0–34.0)
MCHC: 35 g/dL (ref 30.0–36.0)
MCV: 86.6 fL (ref 78.0–100.0)
MONO ABS: 1.1 10*3/uL — AB (ref 0.1–1.0)
MONOS PCT: 8 %
Neutro Abs: 9.3 10*3/uL — ABNORMAL HIGH (ref 1.7–7.7)
Neutrophils Relative %: 67 %
PLATELETS: 307 10*3/uL (ref 150–400)
RBC: 2.77 MIL/uL — ABNORMAL LOW (ref 4.22–5.81)
RDW: 16.9 % — AB (ref 11.5–15.5)
WBC: 13.9 10*3/uL — ABNORMAL HIGH (ref 4.0–10.5)

## 2015-10-07 LAB — COMPREHENSIVE METABOLIC PANEL
ALBUMIN: 4.1 g/dL (ref 3.5–5.0)
ALT: 12 U/L — ABNORMAL LOW (ref 17–63)
AST: 22 U/L (ref 15–41)
Alkaline Phosphatase: 66 U/L (ref 38–126)
Anion gap: 6 (ref 5–15)
BILIRUBIN TOTAL: 3 mg/dL — AB (ref 0.3–1.2)
BUN: 8 mg/dL (ref 6–20)
CO2: 27 mmol/L (ref 22–32)
Calcium: 9.1 mg/dL (ref 8.9–10.3)
Chloride: 106 mmol/L (ref 101–111)
Creatinine, Ser: 0.44 mg/dL — ABNORMAL LOW (ref 0.61–1.24)
GFR calc Af Amer: >60 mL/min (ref >60)
GFR calc non Af Amer: >60 mL/min (ref >60)
GLUCOSE: 85 mg/dL (ref 65–99)
POTASSIUM: 3.9 mmol/L (ref 3.5–5.1)
SODIUM: 139 mmol/L (ref 135–145)
TOTAL PROTEIN: 7.9 g/dL (ref 6.5–8.1)

## 2015-10-07 MED ORDER — OXYCODONE-ACETAMINOPHEN 5-325 MG PO TABS
1.0000 | ORAL_TABLET | Freq: Once | ORAL | Status: AC
  Administered 2015-10-07: 1 via ORAL
  Filled 2015-10-07: qty 1

## 2015-10-07 NOTE — Progress Notes (Signed)
ED CM called [facility]  who confirms pt went to see Dr Mordecai [facility]Sanchez in the [facility] office on 10/03/15 after [facility] 2 and was a scheduled appt on 10/08/15 at 900 to return to see Dr Mordecai MaesSanchez   ED CM sent pt to [facility] staff for possible assistance

## 2015-10-07 NOTE — ED Triage Notes (Signed)
Pt complains of pain with his sickle cell 7/10. Pt says he is feeling nauseas and lightheaded. Pt states his back and legs are hurting as well.
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
## 2015-10-07 NOTE — Progress Notes (Signed)
Entered in d/c instructions  Matthew DodgeFernando A Sanchez-Brugal, MD  Internal Medicine [phone] [address] [place] [address]   Next Steps: Go on 10/08/2015  Instructions: You have a scheduled appointment on 10/08/15 at 0900 with Dr Mordecai MaesSanchez to establish care

## 2015-10-07 NOTE — ED Notes (Signed)
Pt would like labs/IV done at same time.

## 2015-10-08 ENCOUNTER — Telehealth: Payer: Self-pay | Admitting: *Deleted

## 2015-10-08 ENCOUNTER — Encounter (HOSPITAL_COMMUNITY): Payer: Self-pay | Admitting: Emergency Medicine

## 2015-10-08 ENCOUNTER — Emergency Department (HOSPITAL_COMMUNITY)
Admission: EM | Admit: 2015-10-08 | Discharge: 2015-10-08 | Disposition: A | Discharge status: Home / Self Care | Payer: Medicaid Other | Source: Home / Self Care | Attending: Emergency Medicine | Admitting: Emergency Medicine

## 2015-10-08 ENCOUNTER — Emergency Department (HOSPITAL_COMMUNITY)
Admission: EM | Admit: 2015-10-08 | Discharge: 2015-10-08 | Disposition: A | Discharge status: Home / Self Care | Payer: Medicaid Other | Attending: Emergency Medicine | Admitting: Emergency Medicine

## 2015-10-08 DIAGNOSIS — Z79899 Other long term (current) drug therapy: Secondary | ICD-10-CM

## 2015-10-08 DIAGNOSIS — D5700 Hb-SS disease with crisis, unspecified: Secondary | ICD-10-CM | POA: Insufficient documentation

## 2015-10-08 DIAGNOSIS — G8929 Other chronic pain: Secondary | ICD-10-CM

## 2015-10-08 DIAGNOSIS — F17210 Nicotine dependence, cigarettes, uncomplicated: Secondary | ICD-10-CM

## 2015-10-08 DIAGNOSIS — Z791 Long term (current) use of non-steroidal anti-inflammatories (NSAID): Secondary | ICD-10-CM

## 2015-10-08 MED ORDER — OXYCODONE HCL 5 MG PO TABS
30.0000 mg | ORAL_TABLET | Freq: Once | ORAL | Status: AC
  Administered 2015-10-08: 30 mg via ORAL
  Filled 2015-10-08: qty 6
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
## 2015-10-08 NOTE — ED Notes (Signed)
Pt reports chronic pain in back and legs and was evaluated earlier and discharged. Pt states that previous medication given was not strong enough for his pain per pt "the Percocet 5's are like taking a Motrin"

## 2015-10-08 NOTE — ED Notes (Signed)
Bed: [facility] Expected date:  Expected time:  Means of arrival:  Comments: Triage 

## 2015-10-08 NOTE — ED Triage Notes (Signed)
Patient reports back and bilateral leg pain x4 days. Patient was seen here yesterday for the same. Denies chest pain and shortness of breath. Hx of sickle cell.

## 2015-10-08 NOTE — Discharge Instructions (Signed)
Please go to your scheduled appointment in 1 hour with Dr. Mordecai MaesSanchez. You will need this appointment to establish primary care. Please work on Ambulance persongetting your [place]. Return to the ED if you experience fevers, chills, difficulty breathing, severe chest pain, significant cough.

## 2015-10-08 NOTE — ED Triage Notes (Signed)
Pt states that he was seen earlier today and is still having back and stomach pain. Alert and oriented. Nausea.

## 2015-10-08 NOTE — ED Notes (Signed)
Protocol orders canceled at this time. Pt has a care plan stating extensive workup to be avoided and home meds to be given. Waiting for physician.
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
## 2015-10-26 ENCOUNTER — Encounter (HOSPITAL_COMMUNITY): Payer: Self-pay | Admitting: Emergency Medicine

## 2015-10-26 ENCOUNTER — Emergency Department (HOSPITAL_COMMUNITY)
Admission: EM | Admit: 2015-10-26 | Discharge: 2015-10-26 | Disposition: A | Discharge status: Home / Self Care | Payer: Medicaid Other | Attending: Emergency Medicine | Admitting: Emergency Medicine

## 2015-10-26 DIAGNOSIS — Z791 Long term (current) use of non-steroidal anti-inflammatories (NSAID): Secondary | ICD-10-CM | POA: Insufficient documentation

## 2015-10-26 DIAGNOSIS — Z79899 Other long term (current) drug therapy: Secondary | ICD-10-CM | POA: Diagnosis not present

## 2015-10-26 DIAGNOSIS — D5700 Hb-SS disease with crisis, unspecified: Secondary | ICD-10-CM | POA: Insufficient documentation

## 2015-10-26 DIAGNOSIS — F17210 Nicotine dependence, cigarettes, uncomplicated: Secondary | ICD-10-CM | POA: Insufficient documentation

## 2015-10-26 DIAGNOSIS — M549 Dorsalgia, unspecified: Secondary | ICD-10-CM | POA: Diagnosis present

## 2015-10-26 MED ORDER — PROMETHAZINE HCL 25 MG/ML IJ SOLN
25.0000 mg | Freq: Once | INTRAMUSCULAR | Status: AC
  Administered 2015-10-26: 25 mg via INTRAMUSCULAR
  Filled 2015-10-26: qty 1

## 2015-10-26 MED ORDER — KETOROLAC TROMETHAMINE 60 MG/2ML IM SOLN
60.0000 mg | Freq: Once | INTRAMUSCULAR | Status: DC
  Filled 2015-10-26: qty 2

## 2015-10-26 MED ORDER — OXYCODONE HCL 5 MG PO TABS
30.0000 mg | ORAL_TABLET | Freq: Once | ORAL | Status: AC
  Administered 2015-10-26: 30 mg via ORAL
  Filled 2015-10-26: qty 6

## 2015-10-26 NOTE — Discharge Instructions (Signed)
Follow up with your hematologist.   Take 4 over the counter ibuprofen tablets 3 times a day or 2 over-the-counter naproxen tablets twice a day for pain. Also take tylenol 1000mg (2 extra strength) four times a day.

## 2015-10-26 NOTE — ED Triage Notes (Signed)
Pt c/o back and hip pain onset 2 days ago, symptoms consistent with pt's typical Sickle Cell Pain Crisis.
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
## 2016-03-24 IMAGING — CT CT MAXILLOFACIAL W/ CM
3 series · 16 of 47 positions shown, 19 images · IV contrast (omnipaque)
Comparison: None.

CLINICAL DATA: Left facial pain and swelling.  Left molar pain.

EXAM:
CT MAXILLOFACIAL WITH CONTRAST
TECHNIQUE: Multidetector CT imaging of the maxillofacial structures was
performed with intravenous contrast. Multiplanar CT image
reconstructions were also generated. A small metallic BB was placed
on the right temple in order to reliably differentiate right from
left.
CONTRAST:  100mL OMNIPAQUE IOHEXOL 300 MG/ML  SOLN

[Series 3: facial st · axial · 0.37mm/px · z∈[-267,-125]mm · 10 of 83 slices shown, 13 images]
[im 6/83  brain]
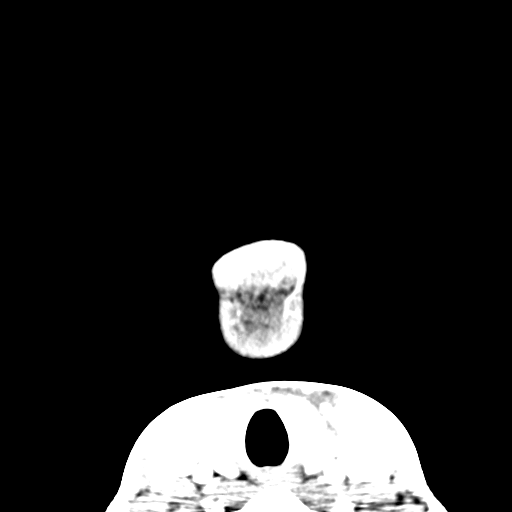
[im 6/83  bone]
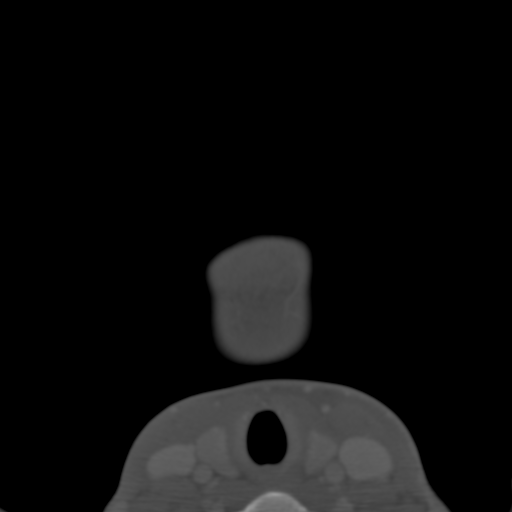
[im 15/83  bone]
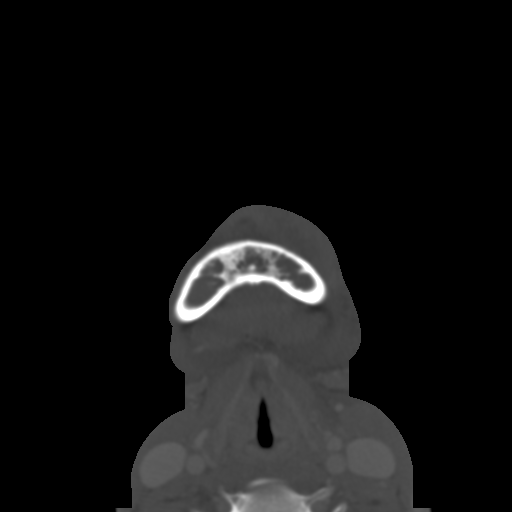
[im 23/83  bone]
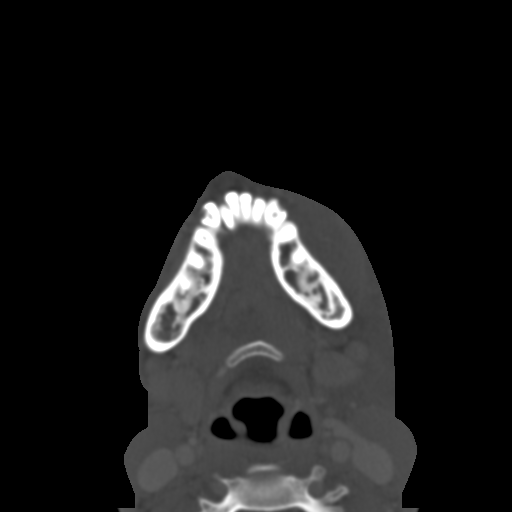
[im 29/83  bone]
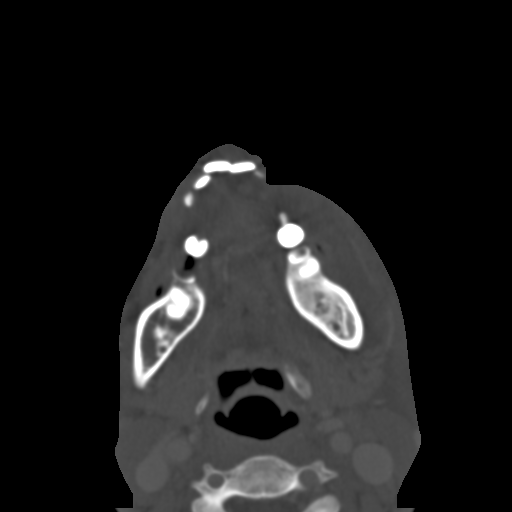
[im 37/83  brain]
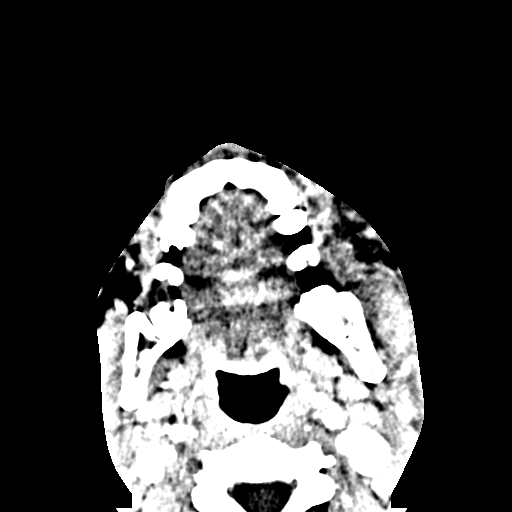
[im 37/83  bone]
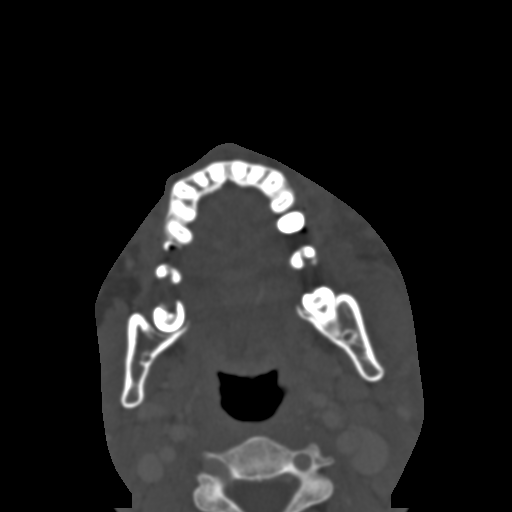
[im 46/83  bone]
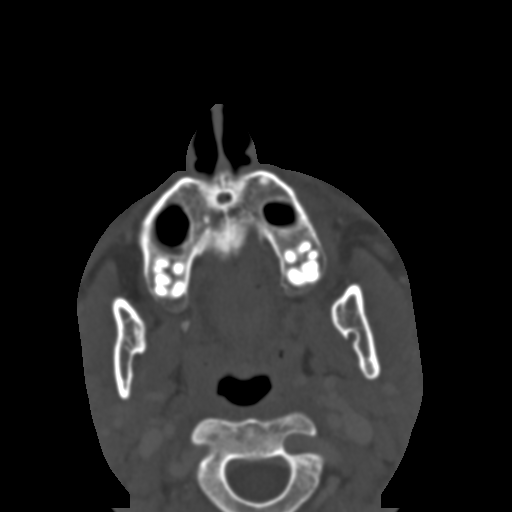
[im 54/83  bone]
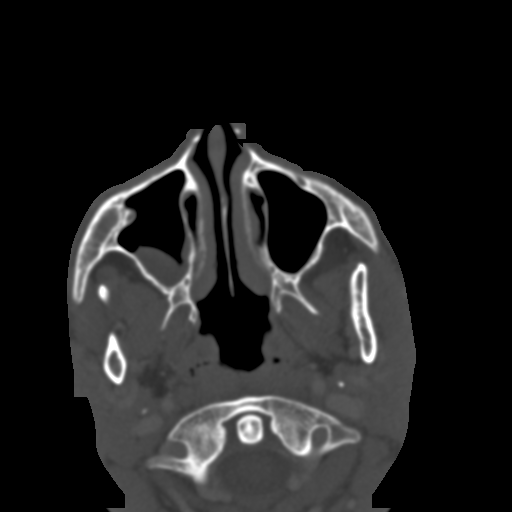
[im 63/83  bone]
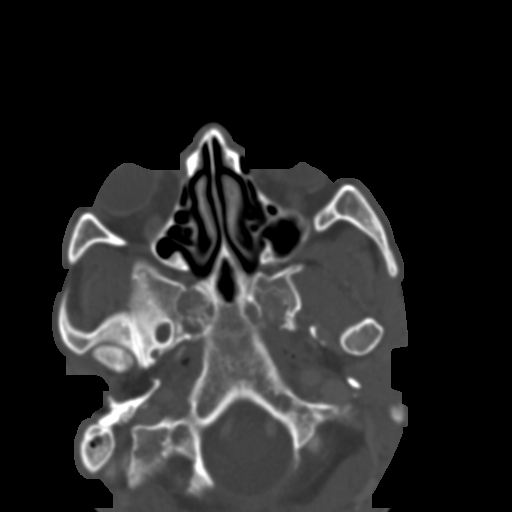
[im 68/83  brain]
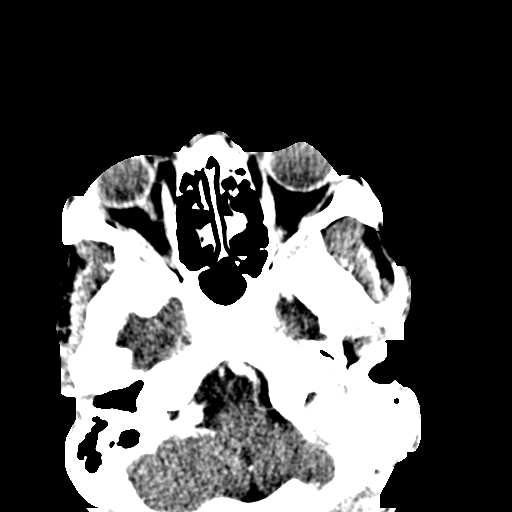
[im 68/83  bone]
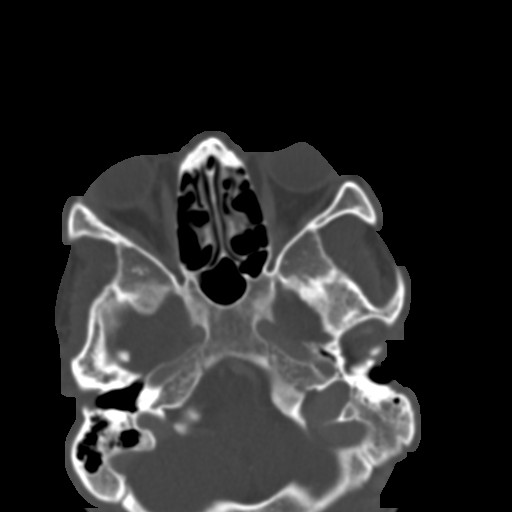
[im 77/83  bone]
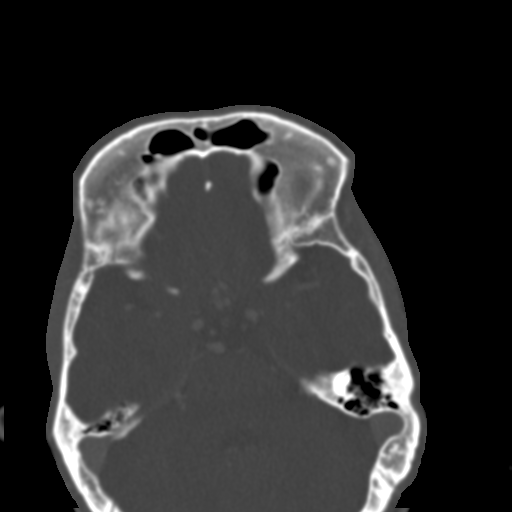

[Series 5: coronal st · coronal · 0.30mm/px · 3 of 69 slices shown]
[im 23/69  bone]
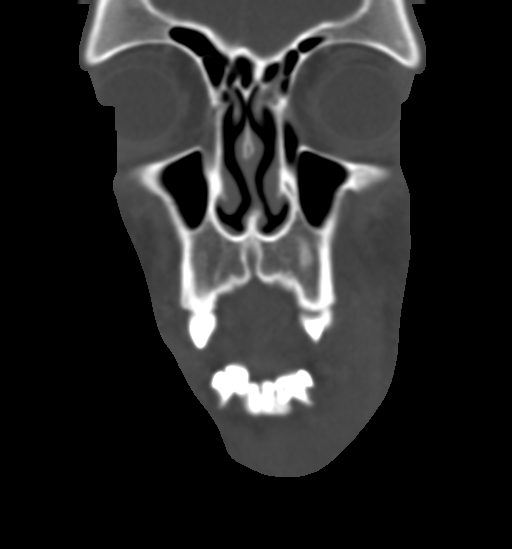
[im 31/69  bone]
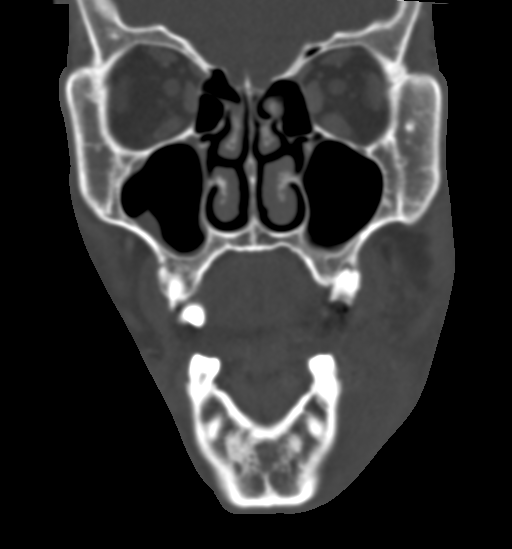
[im 38/69  bone]
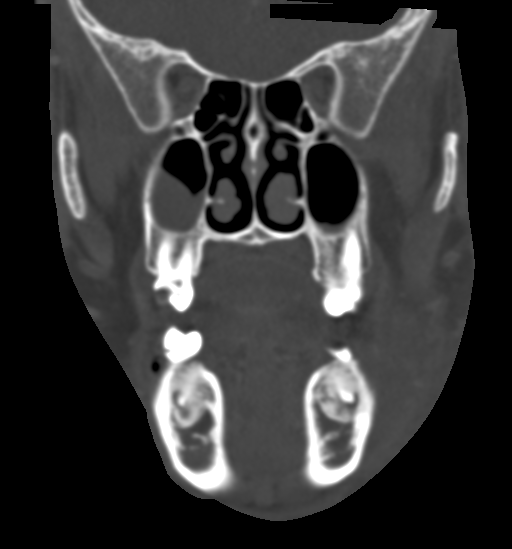

[Series 6: sagittal st · sagittal · 0.32mm/px · 3 of 77 slices shown]
[im 26/77  bone]
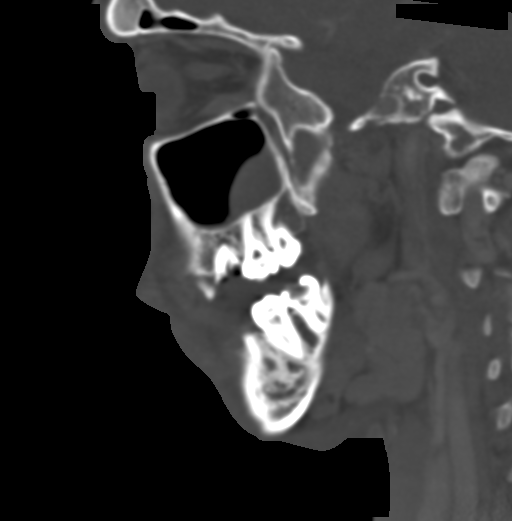
[im 39/77  bone]
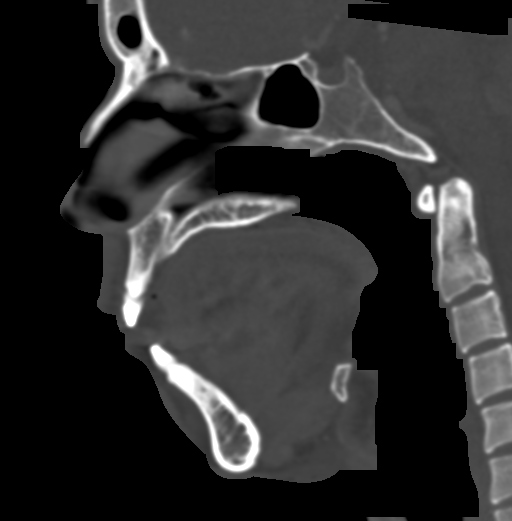
[im 51/77  bone]
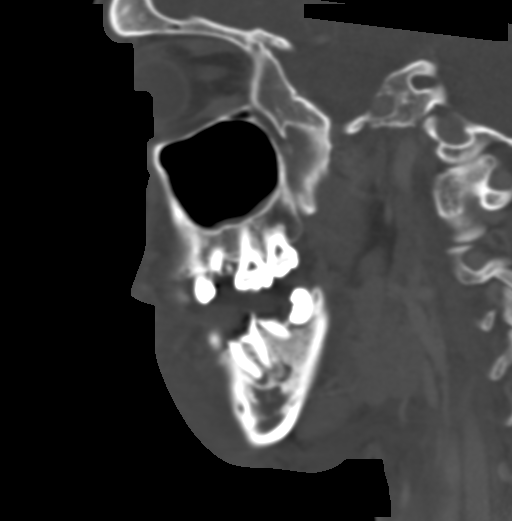

[16 of 47 positions shown; findings below may reference images not displayed]

FINDINGS: The bones in general show expansion consistent with the clinical
diagnosis of sickle cell disease. There is advanced dental decay
throughout the maxillary and mandibular dentition. This is advanced
in multiple locations. Radicular cysts/root abscesses are present at
the left mandibular molars and to a lesser extent the right
mandibular molars. Pronounced inflammation of the left face is
present, but I do not identify a drainable abscess. No extension
into the deep spaces of the face or upper neck.
IMPRESSION: Marrow changes consistent with sickle cell disease.

Advanced widespread dental decay.

Radicular cyst/root abscesses of the left mandibular molars with
pronounced inflammatory changes of the left face but no evidence of
drainable fluid density abscess. Inflammation appears phlegmonous.

## 2016-08-28 ENCOUNTER — Emergency Department (HOSPITAL_COMMUNITY)
Admission: EM | Admit: 2016-08-28 | Discharge: 2016-08-28 | Disposition: A | Discharge status: Home / Self Care | Payer: Medicaid Other | Attending: Emergency Medicine | Admitting: Emergency Medicine

## 2016-08-28 DIAGNOSIS — Z79899 Other long term (current) drug therapy: Secondary | ICD-10-CM | POA: Insufficient documentation

## 2016-08-28 DIAGNOSIS — F17210 Nicotine dependence, cigarettes, uncomplicated: Secondary | ICD-10-CM | POA: Diagnosis not present

## 2016-08-28 DIAGNOSIS — R109 Unspecified abdominal pain: Secondary | ICD-10-CM | POA: Diagnosis present

## 2016-08-28 DIAGNOSIS — D5700 Hb-SS disease with crisis, unspecified: Secondary | ICD-10-CM | POA: Diagnosis not present

## 2016-08-28 MED ORDER — OXYCODONE HCL 5 MG PO TABS
30.0000 mg | ORAL_TABLET | Freq: Once | ORAL | Status: AC
  Administered 2016-08-28: 30 mg via ORAL
  Filled 2016-08-28: qty 6

## 2016-08-28 MED ORDER — OXYCODONE HCL 5 MG PO TABS
5.0000 mg | ORAL_TABLET | Freq: Once | ORAL | Status: AC
  Administered 2016-08-28: 5 mg via ORAL
  Filled 2016-08-28: qty 1

## 2016-08-28 NOTE — ED Notes (Signed)
Pt reports back and abd pain, this is where he normally has pain when he has a crisis.  Pt in NAD.  Pt is A&Ox 4.

## 2016-08-28 NOTE — ED Triage Notes (Signed)
Pt states that he has had back and abdominal pain x 2 days. Tried at home meds w/o relief. Nausea w/o vomiting. Alert and oriented. No SOB/Chest pain.
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
## 2016-08-28 NOTE — ED Notes (Signed)
1 tab of oxy IR wasted d/t tab dropped on the floor.

## 2016-09-15 IMAGING — CR DG CHEST 2V
2 series · 2 of 2 positions shown · non-contrast
Comparison: Chest radiograph performed 05/06/2015

CLINICAL DATA: Sickle cell pain crisis. Worsening mid chest pain,
radiating to the back. Initial encounter.

EXAM:
CHEST  2 VIEW

[w chest pa]
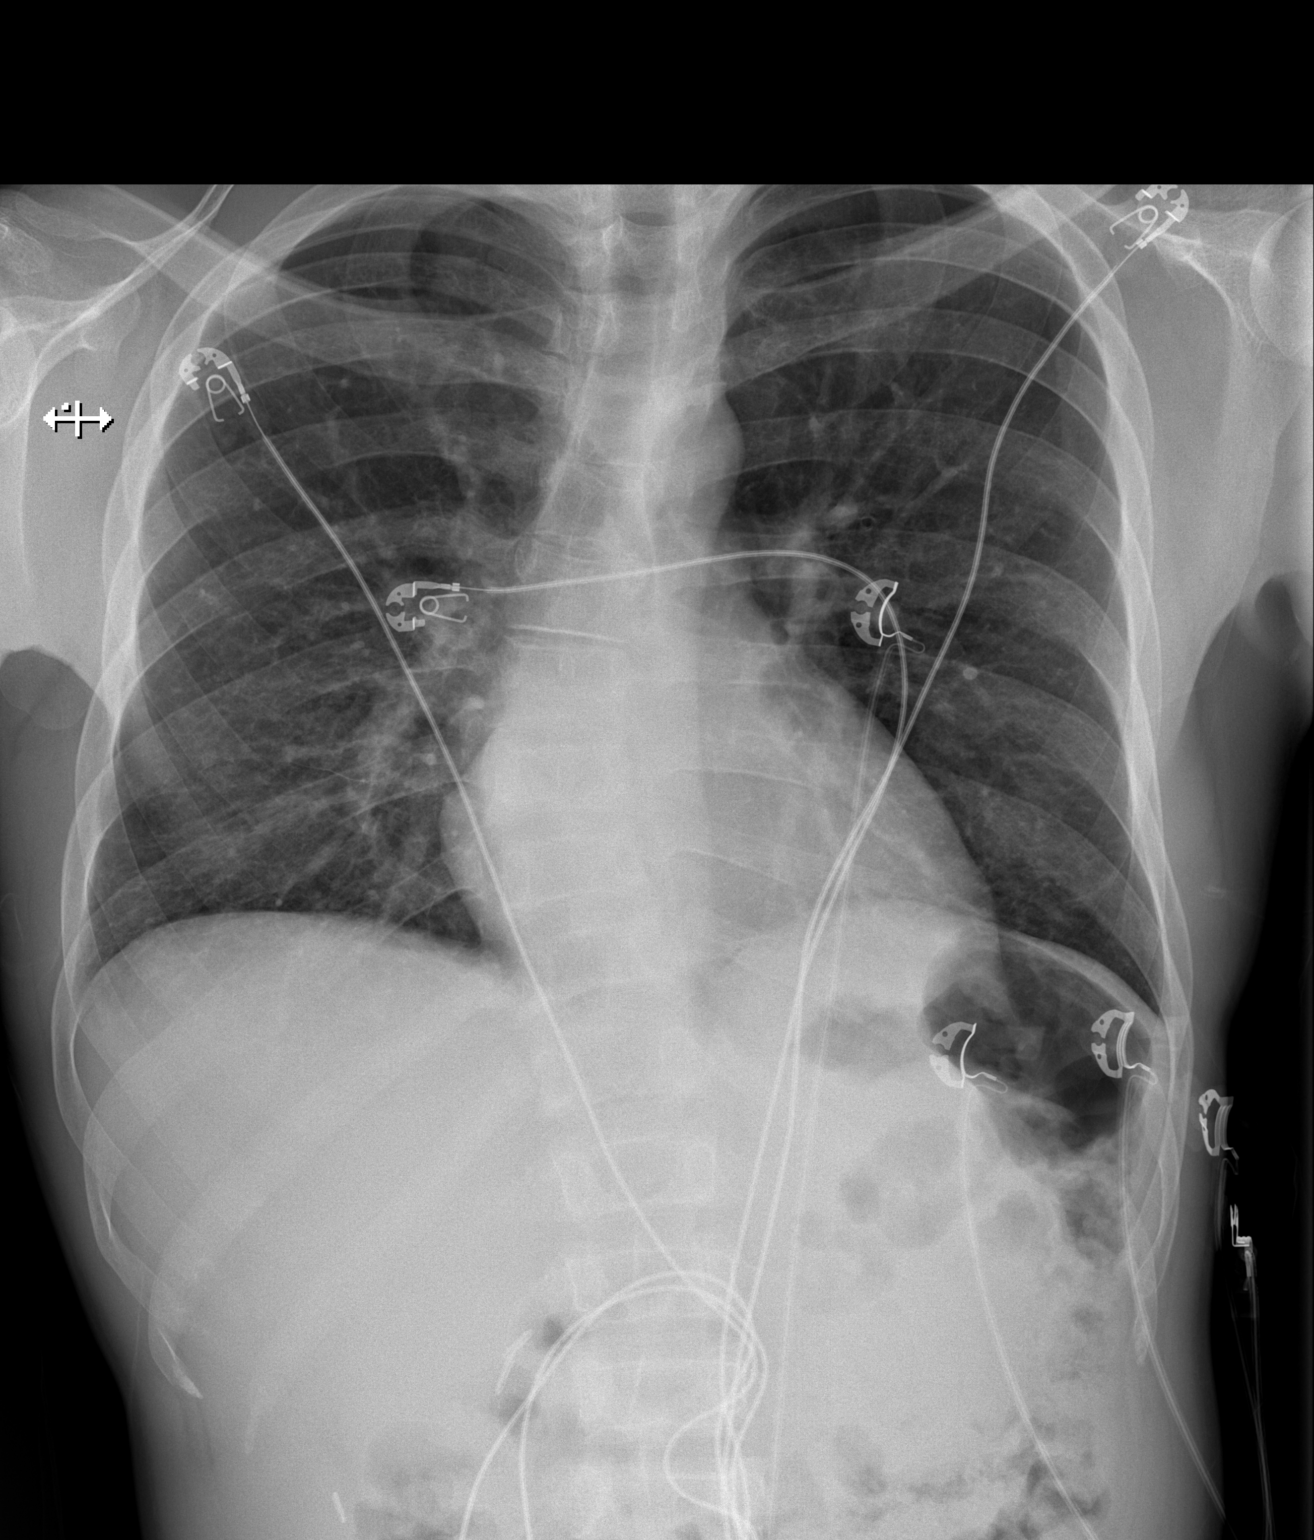

[w chest lat]
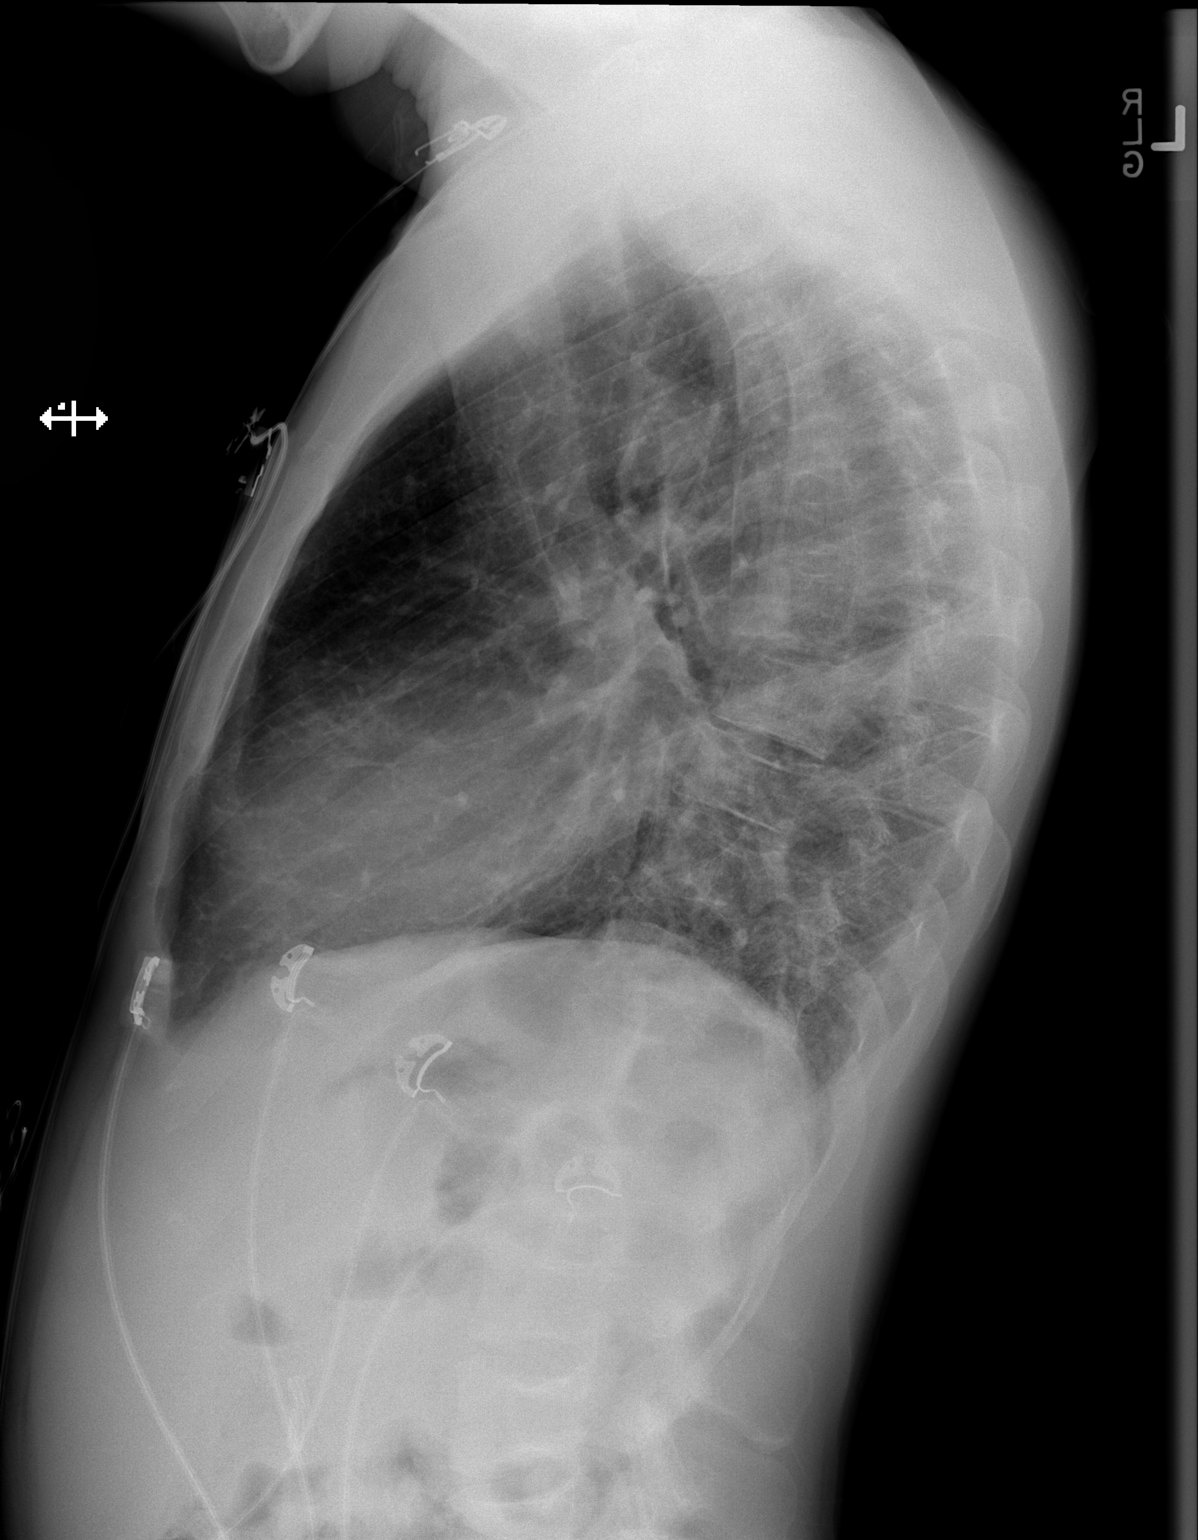

[2 of 2 positions shown; findings below may reference images not displayed]

FINDINGS: The lungs are well-aerated and clear. There is no evidence of focal
opacification, pleural effusion or pneumothorax.

The heart is normal in size; the mediastinal contour is within
normal limits. No acute osseous abnormalities are seen.
IMPRESSION: No acute cardiopulmonary process seen.

## 2016-12-07 IMAGING — CR DG CHEST 2V
2 series · 2 of 2 positions shown · non-contrast
Comparison: 07/11/2015

CLINICAL DATA: 25-year-old with shortness of breath, nausea and
left-sided chest pain.

EXAM:
CHEST  2 VIEW

[w chest pa]
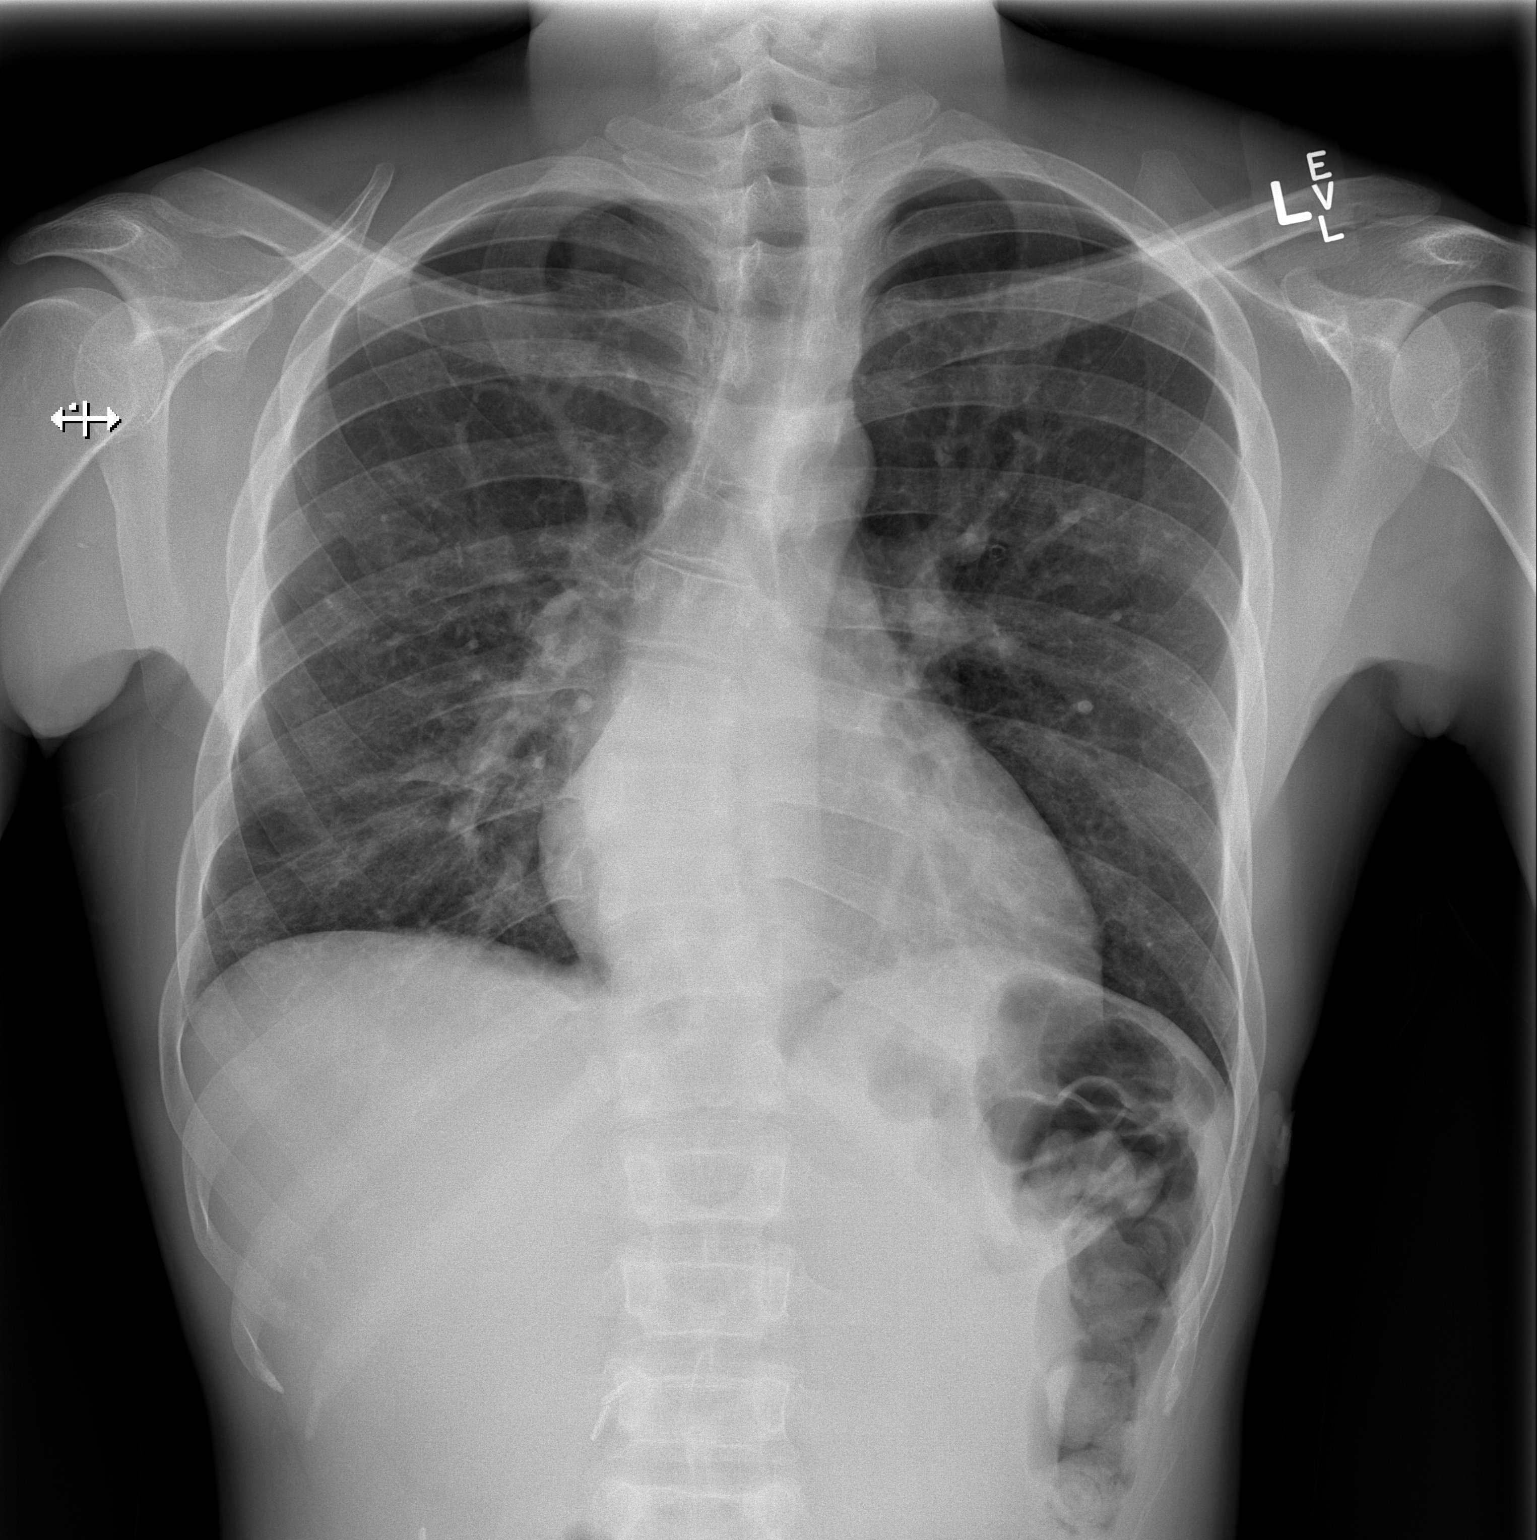

[w chest lat]
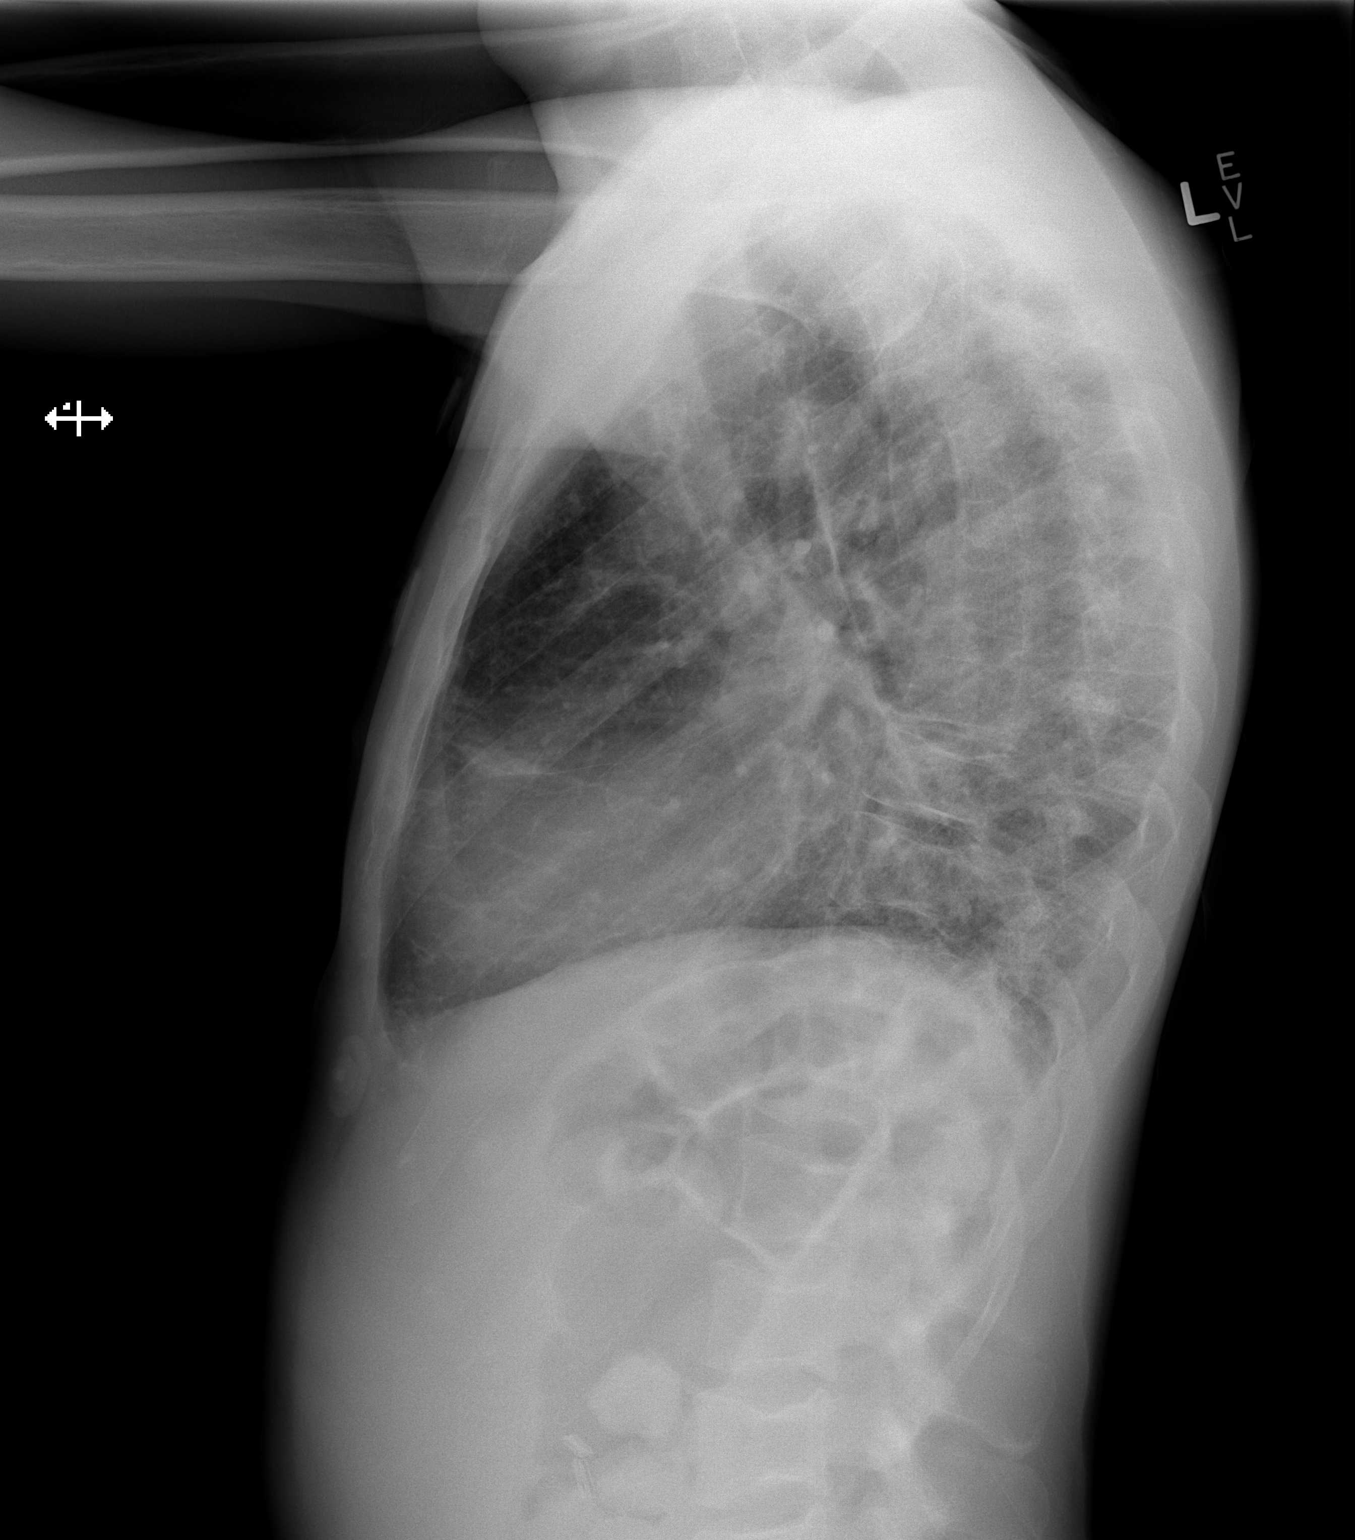

[2 of 2 positions shown; findings below may reference images not displayed]

FINDINGS: The heart size and mediastinal contours are within normal limits.
Subsegmental atelectasis noted within the right middle lobe. The
visualized skeletal structures are unremarkable.
IMPRESSION: 1. Subsegmental atelectasis in the right middle lobe.

## 2017-08-09 ENCOUNTER — Encounter: Payer: Self-pay | Admitting: Family Medicine

## 2017-08-09 ENCOUNTER — Ambulatory Visit (INDEPENDENT_AMBULATORY_CARE_PROVIDER_SITE_OTHER): Payer: Medicaid Other | Admitting: Family Medicine

## 2017-08-09 ENCOUNTER — Other Ambulatory Visit: Payer: Self-pay

## 2017-08-09 VITALS — BP 123/66 | HR 86 | Temp 98.7°F | Resp 14 | Ht 70.0 in | Wt 128.0 lb

## 2017-08-09 DIAGNOSIS — Z765 Malingerer [conscious simulation]: Secondary | ICD-10-CM | POA: Diagnosis not present

## 2017-08-09 DIAGNOSIS — Z720 Tobacco use: Secondary | ICD-10-CM | POA: Diagnosis not present

## 2017-08-09 DIAGNOSIS — D571 Sickle-cell disease without crisis: Secondary | ICD-10-CM

## 2017-08-09 DIAGNOSIS — Z23 Encounter for immunization: Secondary | ICD-10-CM

## 2017-08-09 DIAGNOSIS — G894 Chronic pain syndrome: Secondary | ICD-10-CM | POA: Diagnosis not present

## 2017-08-09 MED ORDER — IBUPROFEN 600 MG PO TABS: 600.0000 mg | ORAL_TABLET | Freq: Three times a day (TID) | ORAL | 2 refills | Status: DC | PRN

## 2017-08-09 NOTE — Progress Notes (Signed)
Subjective:    Patient ID: Matthew Shannon, male    DOB: 06-28-1990, 27 y.o.   MRN: [id number]  HPI Matthew Shannon, a 27 year old male with a history of sickle cell anemia, HbSS presents to establish care. Matthew Shannon was previously a patient at this clinic, but has been lost to follow up. He has been followed by [facility] for management of chronic pain. Patient was transitioned to [facility]. Patient had positive drug screen in April 2019. He states that pain has not been controlled on current medication regimen. Marland Kitchen He currently has an opiate contract with [facility]. Patient is requesting that pain medications be prescribed in primary care. He is often unable to make it to follow up appointments due to transportation constraints. Patient says that he has had frequent emergency room visits due to inadequate pain control.  Patient has a history of drug abuse. He continues to use marijuana. He denies cocaine use over the past several months. Also, Matthew Shannon is a chronic everyday tobacco user. He smokes 5-6 cigarettes per day. He has attempted to quit in the past without success.   Current pain intensity is 6/10 characterized as constant and throbbing. He has not been taking folic acid or hydroxyurea consistently. He states that he has not been taking Ibuprofen or other adjunct medications because they do not help with pain. He currently denies headache, chest pain, heart palpitations, shortness of breath, dysuria, nausea, vomiting, or diarrhea.   Past Medical History:  Diagnosis Date  . Chronic pain syndrome   . Cocaine abuse (HCC)   . Drug-seeking behavior   . Sickle cell anemia (HCC)   . Substance abuse (HCC)   . Tobacco dependence    Social History   Socioeconomic History  . Marital status: Single    Spouse name: Not on file  . Number of children: Not on file  . Years of education: Not on file  . Highest education level: Not on file  Occupational History  . Not on file  Social  Needs  . Financial resource strain: Not on file  . Food insecurity:    Worry: Not on file    Inability: Not on file  . Transportation needs:    Medical: Not on file    Non-medical: Not on file  Tobacco Use  . Smoking status: Current Every Day Smoker    Packs/day: 0.50    Years: 0.00    Pack years: 0.00    Types: Cigarettes  . Smokeless tobacco: Never Used  Substance and Sexual Activity  . Alcohol use: No    Alcohol/week: 0.0 oz  . Drug use: No  . Sexual activity: Yes    Birth control/protection: Condom  Lifestyle  . Physical activity:    Days per week: Not on file    Minutes per session: Not on file  . Stress: Not on file  Relationships  . Social connections:    Talks on phone: Not on file    Gets together: Not on file    Attends religious service: Not on file    Active member of club or organization: Not on file    Attends meetings of clubs or organizations: Not on file    Relationship status: Not on file  . Intimate partner violence:    Fear of current or ex partner: Not on file    Emotionally abused: Not on file    Physically abused: Not on file    Forced sexual activity: Not  on file  Other Topics Concern  . Not on file  Social History Narrative  . Not on file   Immunization History  Administered Date(s) Administered  . Influenza,inj,Quad PF,6+ Mos 12/17/2014  . Pneumococcal Polysaccharide-23 12/17/2014    Review of Systems  Constitutional: Negative.  Negative for fatigue, fever and unexpected weight change.  HENT: Negative.   Eyes: Negative.  Negative for photophobia and visual disturbance.  Respiratory: Negative.   Cardiovascular: Negative.  Negative for chest pain, palpitations and leg swelling.  Gastrointestinal: Negative.   Endocrine: Negative for polydipsia, polyphagia and polyuria.  Genitourinary: Negative.  Negative for decreased urine volume, hematuria, testicular pain and urgency.  Musculoskeletal: Positive for arthralgias and back pain.  Skin:  Negative.   Allergic/Immunologic: Negative.   Neurological: Negative.   Hematological: Negative.   Psychiatric/Behavioral: Negative.        Objective:   Physical Exam  Constitutional: He is oriented to person, place, and time. He appears well-developed and well-nourished.  HENT:  Head: Normocephalic and atraumatic.  Eyes: Pupils are equal, round, and reactive to light.  Neck: Normal range of motion.  Cardiovascular: Normal rate and regular rhythm.  Murmur heard. Pulmonary/Chest: Effort normal and breath sounds normal.  Abdominal: Soft. Bowel sounds are normal.  Neurological: He is alert and oriented to person, place, and time.  Skin: Skin is warm and dry.      BP 123/66 (BP Location: Right Arm, Patient Position: Sitting, Cuff Size: Normal)   Pulse 86   Temp 98.7 F (37.1 C) (Oral)   Resp 14   Ht 5\' 10"  (1.778 m)   Wt 128 lb (58.1 kg)   SpO2 98%   BMI 18.37 kg/m  Assessment & Plan:  1. Hb-SS disease without crisis (HCC)  Continue Hydrea continued at current dosage.  We discussed the need for good hydration, monitoring of hydration status, avoidance of heat, cold, stress, and infection triggers. We discussed the risks and benefits of Hydrea, including bone marrow suppression, the possibility of GI upset, skin ulcers, hair thinning, and teratogenicity. The patient was reminded of the need to seek medical attention of any symptoms of bleeding, anemia, or infection. Continue folic acid 1 mg daily to prevent aplastic bone marrow crises.    Pulmonary evaluation - Patient denies severe recurrent wheezes, shortness of breath with exercise, or persistent cough. If these symptoms develop, pulmonary function tests with spirometry will be ordered, and if abnormal, plan on referral to Pulmonology for further evaluation.  Cardiac - Routine screening for pulmonary hypertension is not recommended.   Eye - High risk of proliferative retinopathy. Annual eye exam with retinal exam recommended to  patient.   Immunization status - Vaccinations administered on today   Acute and chronic painful episodes - Discussed pain management at length. Patient is requesting a prescription for Oxycodone 30 mg on today. Request denied. He has a current medication adherence agreement withh [facility]. I recommend that he continues to follow up as scheduled and I will send a referral to a local pain clinic. Given information for [facility] to assist with transportation.  Reviewed PMP Aware, no inconsistencies noted.    - Vitamin D, 25-hydroxy - CBC with Differential - Comprehensive metabolic panel - Reticulocytes - EKG 12-Lead - ibuprofen (ADVIL,MOTRIN) 600 MG tablet; Take 1 tablet (600 mg total) by mouth every 8 (eight) hours as needed.  Dispense: 30 tablet; Refill: 2 - Ambulatory referral to Pain Clinic  2. Chronic pain syndrome Advised patient to continue  to follow up with [facility]. I will also send a referral to pain management locally due to transportation constraints. Given information for [facility] to assist with transportation constraints.  - [id number] - Ambulatory referral to Pain Clinic  3. Drug-seeking behavior Patient is steering conversation to pain. He is not interested in health maintenance or adjunct medications.   4. Tobacco abuse He says that he is not ready to quit smoking. He says that he smokes to help cope with chronic pain.   5. Need for Tdap vaccination - Tdap vaccine greater than or equal to 7yo IM  6. Immunization due - Pneumococcal conjugate vaccine 13-valent   RTC: Will follow up by phone with any abnormal results.    The patient was given clear instructions to go to ER or return to medical center if symptoms do not improve, worsen or new problems develop. The patient verbalized understanding.   Nolon NationsLachina Moore Mikal Blasdell  MSN, FNP-C Patient Care [facility] [address]  [place], [place] [address] [phone]

## 2017-08-09 NOTE — Patient Instructions (Addendum)
Will follow up by phone with any abnormal laboratory results Recommend 64 ounces of water per day for cellular rehydration  We discussed in detail that hydroxyurea has been found to be effective in reducing VOC, transfusions, and admissions for pain. It also reduces the duration of pain episodes by at least 50%. It depends on compliance. Compliance can be assessed by a rise in [facility] and drop in WBC/retic/ANC, and platelet count. It typically causes a rise in hemoglobin F, which can be assessed via hemoglobinopathy. It takes 3-6 months to see optimal effects of hydroxyurea therapy.    I recommend that you continue to follow up with [facility] and contact [facility] for transportation.   You will also need to contact Medicaid to send you a new Medicaid card.

## 2017-08-10 ENCOUNTER — Encounter: Payer: Self-pay | Admitting: Family Medicine

## 2017-08-10 LAB — CBC WITH DIFFERENTIAL/PLATELET
BASOS ABS: 0.1 10*3/uL (ref 0.0–0.2)
BASOS: 1 %
EOS (ABSOLUTE): 0.4 10*3/uL (ref 0.0–0.4)
Eos: 3 %
Hematocrit: 28.1 % — ABNORMAL LOW (ref 37.5–51.0)
Hemoglobin: 9.6 g/dL — ABNORMAL LOW (ref 13.0–17.7)
IMMATURE GRANS (ABS): 0.1 10*3/uL (ref 0.0–0.1)
IMMATURE GRANULOCYTES: 1 %
LYMPHS: 17 %
Lymphocytes Absolute: 2.1 10*3/uL (ref 0.7–3.1)
MCH: 32.4 pg (ref 26.6–33.0)
MCHC: 34.2 g/dL (ref 31.5–35.7)
MCV: 95 fL (ref 79–97)
MONOS ABS: 1.5 10*3/uL — AB (ref 0.1–0.9)
Monocytes: 12 %
NEUTROS PCT: 66 %
Neutrophils Absolute: 8.2 10*3/uL — ABNORMAL HIGH (ref 1.4–7.0)
PLATELETS: 309 10*3/uL (ref 150–450)
RBC: 2.96 x10E6/uL — ABNORMAL LOW (ref 4.14–5.80)
RDW: 18.6 % — AB (ref 12.3–15.4)
WBC: 12.3 10*3/uL — ABNORMAL HIGH (ref 3.4–10.8)

## 2017-08-10 LAB — COMPREHENSIVE METABOLIC PANEL
A/G RATIO: 1.4 (ref 1.2–2.2)
ALT: 16 IU/L (ref 0–44)
AST: 26 IU/L (ref 0–40)
Albumin: 4.5 g/dL (ref 3.5–5.5)
Alkaline Phosphatase: 105 IU/L (ref 39–117)
BILIRUBIN TOTAL: 4.9 mg/dL — AB (ref 0.0–1.2)
BUN/Creatinine Ratio: 7 — ABNORMAL LOW (ref 9–20)
BUN: 5 mg/dL — AB (ref 6–20)
CHLORIDE: 101 mmol/L (ref 96–106)
CO2: 25 mmol/L (ref 20–29)
Calcium: 9.9 mg/dL (ref 8.7–10.2)
Creatinine, Ser: 0.69 mg/dL — ABNORMAL LOW (ref 0.76–1.27)
GFR calc Af Amer: 148 mL/min/{1.73_m2} (ref >59)
GFR calc non Af Amer: 128 mL/min/{1.73_m2} (ref >59)
GLUCOSE: 85 mg/dL (ref 65–99)
Globulin, Total: 3.2 g/dL (ref 1.5–4.5)
POTASSIUM: 4.1 mmol/L (ref 3.5–5.2)
Sodium: 139 mmol/L (ref 134–144)
TOTAL PROTEIN: 7.7 g/dL (ref 6.0–8.5)

## 2017-08-10 LAB — VITAMIN D 25 HYDROXY (VIT D DEFICIENCY, FRACTURES): VIT D 25 HYDROXY: 19 ng/mL — AB (ref 30.0–100.0)

## 2017-08-10 LAB — RETICULOCYTES: RETIC CT PCT: 10.9 % — AB (ref 0.6–2.6)

## 2017-08-11 ENCOUNTER — Telehealth: Payer: Self-pay

## 2017-08-11 NOTE — Telephone Encounter (Signed)
-----   Message from Massie MaroonLachina M Hollis, [place] sent at 08/11/2017  4:31 AM EDT ----- Regarding: lab results Please inform patient that vitamin D level is decreased. Advise patient to continue Drisdol 50,000 weekly. Awaiting UDS results, will follow up by phone when results become available.   Nolon NationsLachina Moore Hollis  MSN, FNP-C Patient Care [facility] [address]  [place], [place] [address] [phone]

## 2017-08-11 NOTE — Telephone Encounter (Signed)
Called and spoke with patient. Advised that vitamin D level is decreased and that he should continue vitamin D once weekly. Patient verbalized understanding. I advised him we would let him know when other results become available. Thanks! ~

## 2017-08-12 LAB — 737588 9+OXYCODONE+CRT-UNBUND
Amphetamine Scrn, Ur: NEGATIVE ng/mL
BARBITURATE SCREEN URINE: NEGATIVE ng/mL
BENZODIAZEPINE SCREEN, URINE: NEGATIVE ng/mL
CANNABINOIDS UR QL SCN: NEGATIVE ng/mL
Cocaine (Metab) Scrn, Ur: NEGATIVE ng/mL
Creatinine(Crt), U: 125.2 mg/dL (ref 20.0–300.0)
Methadone Screen, Urine: NEGATIVE ng/mL
PHENCYCLIDINE QUANTITATIVE URINE: NEGATIVE ng/mL
PROPOXYPHENE SCREEN URINE: NEGATIVE ng/mL
Ph of Urine: 7.5 (ref 4.5–8.9)

## 2017-08-12 LAB — OXYCODONE/OXYMORPHONE CONFIRM
OXYCODONE/OXYMORPH: POSITIVE — AB
OXYCODONE: 2102 ng/mL
OXYCODONE: POSITIVE — AB
OXYMORPHONE CONFIRM: >3000 ng/mL
OXYMORPHONE: POSITIVE — AB

## 2017-08-12 LAB — OPIATES CONFIRMATION, URINE: Opiates: NEGATIVE ng/mL

## 2017-08-18 ENCOUNTER — Encounter: Payer: Self-pay | Admitting: Family Medicine

## 2017-08-18 ENCOUNTER — Ambulatory Visit (INDEPENDENT_AMBULATORY_CARE_PROVIDER_SITE_OTHER): Payer: Medicaid Other | Admitting: Family Medicine

## 2017-08-18 VITALS — BP 109/65 | HR 91 | Temp 98.1°F | Resp 16 | Ht 70.0 in | Wt 132.0 lb

## 2017-08-18 DIAGNOSIS — Z720 Tobacco use: Secondary | ICD-10-CM | POA: Diagnosis not present

## 2017-08-18 DIAGNOSIS — D5700 Hb-SS disease with crisis, unspecified: Secondary | ICD-10-CM

## 2017-08-18 MED ORDER — OXYCODONE HCL 30 MG PO TABS: 30.0000 mg | ORAL_TABLET | Freq: Four times a day (QID) | ORAL | 0 refills | Status: DC | PRN

## 2017-08-18 NOTE — Progress Notes (Signed)
    Subjective   Matthew Shannon 27 y.o. male  [id number]  [id number]  08/09/1990    Chief Complaint  Patient presents with  . Sickle Cell Anemia    Patient presents for a follow up on sickle cell SS type. Patient was seen 2 weeks ago and was told to follow up with pain management. Patient states that [facility] does [facility] due to transportation and wanting one place for care. Patient states that he has generalized body pain 7/10.  Patient states that he has been prescribed Oxycodone 30mg  Q6H PRN. Patient denies chest pain, SOB, dizziness or leg/joint swelling. + generalized body pain.    Review of Systems  Constitutional: Negative.   Respiratory: Negative.   Cardiovascular: Negative.   Gastrointestinal: Negative.   Neurological: Negative.   Psychiatric/Behavioral: Negative.     Objective   Physical Exam  Constitutional: He is oriented to person, place, and time. He appears well-developed and well-nourished. No distress.  HENT:  Head: Normocephalic and atraumatic.  Eyes: Pupils are equal, round, and reactive to light. EOM are normal.  Neck: Normal range of motion. Neck supple.  Cardiovascular: Normal rate, regular rhythm and intact distal pulses.  Murmur heard. Pulmonary/Chest: Effort normal and breath sounds normal. No respiratory distress. He has no wheezes. He has no rales.  Musculoskeletal: Normal range of motion.  Neurological: He is alert and oriented to person, place, and time.  Skin: He is not diaphoretic.  Psychiatric: He has a normal mood and affect. His behavior is normal. Judgment and thought content normal.    BP 109/65 (BP Location: Left Arm, Patient Position: Sitting, Cuff Size: Normal)   Pulse 91   Temp 98.1 F (36.7 C) (Oral)   Resp 16   Ht 5\' 10"  (1.778 m)   Wt 132 lb (59.9 kg)   SpO2 96%   BMI 18.94 kg/m   Assessment   Encounter Diagnoses  Name Primary?  . Tobacco abuse   . Sickle cell anemia with pain  (HCC) Yes     Plan  1. Tobacco abuse Encouraged smoking cessation. Patient is not ready to quit.   2. Sickle cell anemia with pain (HCC) We discussed discontinuation of pain contract with [facility]. Discussed Controlled Substance Agreement with specific attention to limiting prescribers to this office only and random UDS. We discussed that this is a trial.  Patient verbalized understanding and agreed with this plan.  Continue with other medications as previously prescribed.  Printed;  - oxycodone (ROXICODONE) 30 MG immediate release tablet; Take 1 tablet (30 mg total) by mouth every 6 (six) hours as needed for up to 15 days for pain.  Dispense: 60 tablet; Refill: 0  Return to care as scheduled in 2 weeks and prn. Patient verbalized understanding and agreed with plan of care.   This note has been created with Education officer, environmentalDragon speech recognition software and smart phrase technology. Any transcriptional errors are unintentional.

## 2017-08-18 NOTE — Patient Instructions (Addendum)
Please remember to take medications as prescribed. Continue with current medications. Drink at least 64 ounces of water a day. I will see you again in 2 weeks.   Sickle Cell Anemia, Adult Sickle cell anemia is a condition where your red blood cells are shaped like sickles. Red blood cells carry oxygen through the body. Sickle-shaped red blood cells do not live as long as normal red blood cells. They also clump together and block blood from flowing through the blood vessels. These things prevent the body from getting enough oxygen. Sickle cell anemia causes organ damage and pain. It also increases the risk of infection. Follow these instructions at home:  Drink enough fluid to keep your pee (urine) clear or pale yellow. Drink more in hot weather and during exercise.  Do not smoke. Smoking lowers oxygen levels in the blood.  Only take over-the-counter or prescription medicines as told by your doctor.  Take antibiotic medicines as told by your doctor. Make sure you finish them even if you start to feel better.  Take supplements as told by your doctor.  Consider wearing a medical alert bracelet. This tells anyone caring for you in an emergency of your condition.  When traveling, keep your medical information, doctors' names, and the medicines you take with you at all times.  If you have a fever, do not take fever medicines right away. This could cover up a problem. Tell your doctor.  Keep all follow-up visits with your doctor. Sickle cell anemia requires regular medical care. Contact a doctor if: You have a fever. Get help right away if:  You feel dizzy or faint.  You have new belly (abdominal) pain, especially on the left side near the stomach area.  You have a lasting, often uncomfortable and painful erection of the penis (priapism). If it is not treated right away, you will become unable to have sex (impotence).  You have numbness in your arms or legs or you have a hard time moving  them.  You have a hard time talking.  You have a fever or lasting symptoms for more than 2-3 days.  You have a fever and your symptoms suddenly get worse.  You have signs or symptoms of infection. These include: ? Chills. ? Being more tired than normal (lethargy). ? Irritability. ? Poor eating. ? Throwing up (vomiting).  You have pain that is not helped with medicine.  You have shortness of breath.  You have pain in your chest.  You are coughing up pus-like or bloody mucus.  You have a stiff neck.  Your feet or hands swell or have pain.  Your belly looks bloated.  Your joints hurt. This information is not intended to replace advice given to you by your health care provider. Make sure you discuss any questions you have with your health care provider. Document Released: 11/16/2012 Document Revised: 07/04/2015 Document Reviewed: 09/07/2012 Elsevier Interactive Patient Education  2017 [employer]

## 2017-08-23 ENCOUNTER — Ambulatory Visit: Payer: Self-pay | Admitting: Family Medicine

## 2017-09-01 ENCOUNTER — Encounter: Payer: Self-pay | Admitting: Family Medicine

## 2017-09-01 ENCOUNTER — Ambulatory Visit (INDEPENDENT_AMBULATORY_CARE_PROVIDER_SITE_OTHER): Payer: Medicaid Other | Admitting: Family Medicine

## 2017-09-01 ENCOUNTER — Ambulatory Visit: Payer: Self-pay | Admitting: Family Medicine

## 2017-09-01 DIAGNOSIS — D5700 Hb-SS disease with crisis, unspecified: Secondary | ICD-10-CM | POA: Diagnosis not present

## 2017-09-01 MED ORDER — OXYCODONE HCL 30 MG PO TABS: 30.0000 mg | ORAL_TABLET | Freq: Four times a day (QID) | ORAL | 0 refills | Status: DC | PRN

## 2017-09-01 NOTE — Progress Notes (Signed)
    Subjective   Matthew HarrisonBobby Shannon 27 y.o. male  [id number]  [id number]  03/21/1990    Chief Complaint  Patient presents with  . Follow-up    2 week follow up     Patient presents for follow up on pain medications and sickle cell anemia. Patient states that his pain today is 6/10 and resides mostly in the back. Patient states that he is taking the pain medication as prescribed.    Review of Systems  Constitutional: Negative.   HENT: Negative.   Respiratory: Negative.   Cardiovascular: Negative.   Musculoskeletal: Positive for myalgias.  Neurological: Negative.   Psychiatric/Behavioral: Negative.     Objective   Physical Exam  Constitutional: He is oriented to person, place, and time. He appears well-developed and well-nourished.  HENT:  Head: Normocephalic and atraumatic.  Right Ear: External ear normal.  Left Ear: External ear normal.  Nose: Nose normal.  Mouth/Throat: Oropharynx is clear and moist.  Eyes: Pupils are equal, round, and reactive to light. Conjunctivae and EOM are normal.  Neck: Normal range of motion. Neck supple. No tracheal deviation present. No thyromegaly present.  Cardiovascular: Normal rate, regular rhythm, normal heart sounds and intact distal pulses. Exam reveals no friction rub.  No murmur heard. Pulmonary/Chest: Effort normal and breath sounds normal. No stridor. No respiratory distress. He has no wheezes.  Abdominal: Soft. Bowel sounds are normal. He exhibits no distension and no mass. There is no tenderness.  Musculoskeletal: He exhibits tenderness (back).  Lymphadenopathy:    He has no cervical adenopathy.  Neurological: He is alert and oriented to person, place, and time.  Skin: Skin is warm and dry.  Psychiatric: He has a normal mood and affect. His behavior is normal. Judgment and thought content normal.  Nursing note and vitals reviewed.   BP 104/63 (BP Location: Left Arm, Patient Position: Sitting, Cuff Size: Normal)   Pulse 87    Temp 99.5 F (37.5 C) (Oral)   Resp 16   Ht 5\' 10"  (1.778 m)   Wt 128 lb (58.1 kg)   SpO2 98%   BMI 18.37 kg/m   Assessment   Encounter Diagnosis  Name Primary?  . Sickle cell anemia with pain (HCC)      Plan  1. Sickle cell anemia with pain (HCC) Refilled medication. Patient is encouraged to increase fluids.   RTC one month   This note has been created with Education officer, environmentalDragon speech recognition software and smart phrase technology. Any transcriptional errors are unintentional.

## 2017-09-01 NOTE — Patient Instructions (Signed)
Sickle Cell Anemia, Adult °Sickle cell anemia is a condition where your red blood cells are shaped like sickles. Red blood cells carry oxygen through the body. Sickle-shaped red blood cells do not live as long as normal red blood cells. They also clump together and block blood from flowing through the blood vessels. These things prevent the body from getting enough oxygen. Sickle cell anemia causes organ damage and pain. It also increases the risk of infection. °Follow these instructions at home: °· Drink enough fluid to keep your pee (urine) clear or pale yellow. Drink more in hot weather and during exercise. °· Do not smoke. Smoking lowers oxygen levels in the blood. °· Only take over-the-counter or prescription medicines as told by your doctor. °· Take antibiotic medicines as told by your doctor. Make sure you finish them even if you start to feel better. °· Take supplements as told by your doctor. °· Consider wearing a medical alert bracelet. This tells anyone caring for you in an emergency of your condition. °· When traveling, keep your medical information, doctors' names, and the medicines you take with you at all times. °· If you have a fever, do not take fever medicines right away. This could cover up a problem. Tell your doctor. °· Keep all follow-up visits with your doctor. Sickle cell anemia requires regular medical care. °Contact a doctor if: °You have a fever. °Get help right away if: °· You feel dizzy or faint. °· You have new belly (abdominal) pain, especially on the left side near the stomach area. °· You have a lasting, often uncomfortable and painful erection of the penis (priapism). If it is not treated right away, you will become unable to have sex (impotence). °· You have numbness in your arms or legs or you have a hard time moving them. °· You have a hard time talking. °· You have a fever or lasting symptoms for more than 2-3 days. °· You have a fever and your symptoms suddenly get  worse. °· You have signs or symptoms of infection. These include: °? Chills. °? Being more tired than normal (lethargy). °? Irritability. °? Poor eating. °? Throwing up (vomiting). °· You have pain that is not helped with medicine. °· You have shortness of breath. °· You have pain in your chest. °· You are coughing up pus-like or bloody mucus. °· You have a stiff neck. °· Your feet or hands swell or have pain. °· Your belly looks bloated. °· Your joints hurt. °This information is not intended to replace advice given to you by your health care provider. Make sure you discuss any questions you have with your health care provider. °Document Released: 11/16/2012 Document Revised: 07/04/2015 Document Reviewed: 09/07/2012 °Elsevier Interactive Patient Education © 2017 Elsevier Inc. ° °

## 2017-09-13 ENCOUNTER — Telehealth: Payer: Self-pay

## 2017-09-13 DIAGNOSIS — D5700 Hb-SS disease with crisis, unspecified: Secondary | ICD-10-CM

## 2017-09-13 MED ORDER — OXYCODONE HCL 30 MG PO TABS: 30.0000 mg | ORAL_TABLET | Freq: Four times a day (QID) | ORAL | 0 refills | Status: DC | PRN

## 2017-09-13 NOTE — Telephone Encounter (Signed)
Refilled. Cannot fill before 09/16/2017

## 2017-09-16 ENCOUNTER — Other Ambulatory Visit: Payer: Self-pay | Admitting: Internal Medicine

## 2017-09-16 ENCOUNTER — Telehealth: Payer: Self-pay

## 2017-09-16 NOTE — Telephone Encounter (Signed)
This was refilled on 8/5

## 2017-09-17 ENCOUNTER — Other Ambulatory Visit: Payer: Self-pay | Admitting: Internal Medicine

## 2017-09-17 DIAGNOSIS — D5700 Hb-SS disease with crisis, unspecified: Secondary | ICD-10-CM

## 2017-09-17 MED ORDER — OXYCODONE HCL 30 MG PO TABS: 30.0000 mg | ORAL_TABLET | Freq: Four times a day (QID) | ORAL | 0 refills | Status: DC | PRN

## 2017-09-28 ENCOUNTER — Other Ambulatory Visit: Payer: Self-pay | Admitting: Internal Medicine

## 2017-09-28 ENCOUNTER — Telehealth: Payer: Self-pay

## 2017-09-28 NOTE — Telephone Encounter (Signed)
Not due until 8/24. Please send subsequent medication refills to the NP seeing this patient. Thanks

## 2017-10-01 ENCOUNTER — Ambulatory Visit: Payer: Self-pay | Admitting: Family Medicine

## 2017-10-01 ENCOUNTER — Telehealth: Payer: Self-pay

## 2017-10-01 DIAGNOSIS — D5700 Hb-SS disease with crisis, unspecified: Secondary | ICD-10-CM

## 2017-10-01 MED ORDER — OXYCODONE HCL 30 MG PO TABS: 30.0000 mg | ORAL_TABLET | Freq: Four times a day (QID) | ORAL | 0 refills | Status: DC | PRN

## 2017-10-01 NOTE — Telephone Encounter (Signed)
Refilled

## 2017-10-04 ENCOUNTER — Ambulatory Visit: Payer: Self-pay | Admitting: Family Medicine

## 2017-10-04 ENCOUNTER — Telehealth (HOSPITAL_COMMUNITY): Payer: Self-pay | Admitting: General Practice

## 2017-10-04 ENCOUNTER — Telehealth: Payer: Self-pay

## 2017-10-04 DIAGNOSIS — D5700 Hb-SS disease with crisis, unspecified: Secondary | ICD-10-CM

## 2017-10-04 MED ORDER — OXYCODONE HCL 30 MG PO TABS: 30.0000 mg | ORAL_TABLET | Freq: Four times a day (QID) | ORAL | 0 refills | Status: DC | PRN

## 2017-10-04 NOTE — Telephone Encounter (Signed)
Patient called, complained of pain in the left chest ("typical during crisis") and back pain that he rated as 8/10. Denied fever, diarrhea, abdominal pain, admittted some nausea, no vomiting. Also wanted his prescription to be sent to [facility] [place]. Provider notified. Per provider, we are full now hence patient should contact primary care provider for pain medications.  Patient also told to to go to the ER if necessary. Primary care provider to call patient to discuss the prescription matter. Per primary care provider, prescription has been sent to patient's desired pharmacy.  Patient was notified by Vernona RiegerLaura about this prescription order.

## 2017-10-04 NOTE — Telephone Encounter (Signed)
Sent to [facility] [place] [facility].

## 2017-10-04 NOTE — Telephone Encounter (Signed)
Please call and see why his medication was cancelled.

## 2017-10-13 ENCOUNTER — Other Ambulatory Visit: Payer: Self-pay

## 2017-10-13 ENCOUNTER — Emergency Department (HOSPITAL_BASED_OUTPATIENT_CLINIC_OR_DEPARTMENT_OTHER)
Admission: EM | Admit: 2017-10-13 | Discharge: 2017-10-13 | Discharge status: Against Medical Advice | Payer: Medicaid Other | Attending: Emergency Medicine | Admitting: Emergency Medicine

## 2017-10-13 ENCOUNTER — Encounter (HOSPITAL_BASED_OUTPATIENT_CLINIC_OR_DEPARTMENT_OTHER): Payer: Self-pay | Admitting: *Deleted

## 2017-10-13 DIAGNOSIS — Z79899 Other long term (current) drug therapy: Secondary | ICD-10-CM | POA: Insufficient documentation

## 2017-10-13 DIAGNOSIS — F17210 Nicotine dependence, cigarettes, uncomplicated: Secondary | ICD-10-CM | POA: Insufficient documentation

## 2017-10-13 DIAGNOSIS — D5700 Hb-SS disease with crisis, unspecified: Secondary | ICD-10-CM | POA: Insufficient documentation

## 2017-10-13 DIAGNOSIS — M549 Dorsalgia, unspecified: Secondary | ICD-10-CM | POA: Diagnosis present

## 2017-10-13 DIAGNOSIS — Z5329 Procedure and treatment not carried out because of patient's decision for other reasons: Secondary | ICD-10-CM | POA: Diagnosis not present

## 2017-10-13 LAB — CBC WITH DIFFERENTIAL/PLATELET
BASOS ABS: 0.1 10*3/uL (ref 0.0–0.1)
Basophils Relative: 0 %
EOS PCT: 3 %
Eosinophils Absolute: 0.5 10*3/uL (ref 0.0–0.7)
HEMATOCRIT: 22.3 % — AB (ref 39.0–52.0)
Hemoglobin: 7.8 g/dL — ABNORMAL LOW (ref 13.0–17.0)
Lymphocytes Relative: 21 %
Lymphs Abs: 3.3 10*3/uL (ref 0.7–4.0)
MCH: 32.1 pg (ref 26.0–34.0)
MCHC: 35 g/dL (ref 30.0–36.0)
MCV: 91.8 fL (ref 78.0–100.0)
MONO ABS: 1.6 10*3/uL (ref 0.1–1.0)
Monocytes Relative: 11 %
Neutro Abs: 10.2 10*3/uL (ref 1.7–7.7)
Neutrophils Relative %: 65 %
Platelets: 286 10*3/uL (ref 150–400)
RBC: 2.43 MIL/uL — AB (ref 4.22–5.81)
RDW: 20.5 % — AB (ref 11.5–15.5)
WBC: 15.7 10*3/uL — AB (ref 4.0–10.5)

## 2017-10-13 LAB — COMPREHENSIVE METABOLIC PANEL
ALT: 16 U/L (ref 0–44)
AST: 32 U/L (ref 15–41)
Albumin: 4.3 g/dL (ref 3.5–5.0)
Alkaline Phosphatase: 75 U/L (ref 38–126)
Anion gap: 6 (ref 5–15)
BILIRUBIN TOTAL: 4.2 mg/dL — AB (ref 0.3–1.2)
BUN: 7 mg/dL (ref 6–20)
CO2: 28 mmol/L (ref 22–32)
Calcium: 9 mg/dL (ref 8.9–10.3)
Chloride: 108 mmol/L (ref 98–111)
Creatinine, Ser: 0.74 mg/dL (ref 0.61–1.24)
GFR calc Af Amer: >60 mL/min (ref >60)
Glucose, Bld: 88 mg/dL (ref 70–99)
POTASSIUM: 4 mmol/L (ref 3.5–5.1)
Sodium: 142 mmol/L (ref 135–145)
TOTAL PROTEIN: 7.9 g/dL (ref 6.5–8.1)

## 2017-10-13 LAB — RETICULOCYTES
RBC.: 2.44 MIL/uL — AB (ref 4.22–5.81)
RETIC CT PCT: 13.6 % — AB (ref 0.4–3.1)
Retic Count, Absolute: 331.8 10*3/uL — ABNORMAL HIGH (ref 19.0–186.0)

## 2017-10-13 MED ORDER — DIPHENHYDRAMINE HCL 25 MG PO CAPS
25.0000 mg | ORAL_CAPSULE | Freq: Once | ORAL | Status: AC
  Administered 2017-10-13: 25 mg via ORAL

## 2017-10-13 MED ORDER — DEXTROSE-NACL 5-0.45 % IV SOLN
INTRAVENOUS | Status: DC
  Administered 2017-10-13: 16:00:00 via INTRAVENOUS

## 2017-10-13 MED ORDER — OXYCODONE HCL 5 MG PO TABS
10.0000 mg | ORAL_TABLET | Freq: Once | ORAL | Status: AC
Start: 2017-10-13 — End: 2017-10-13
  Administered 2017-10-13: 10 mg via ORAL
  Filled 2017-10-13: qty 2

## 2017-10-13 MED ORDER — KETOROLAC TROMETHAMINE 30 MG/ML IJ SOLN
30.0000 mg | INTRAMUSCULAR | Status: AC
  Administered 2017-10-13: 30 mg via INTRAVENOUS
  Filled 2017-10-13: qty 1

## 2017-10-13 MED ORDER — DIPHENHYDRAMINE HCL 25 MG PO CAPS
ORAL_CAPSULE | ORAL | Status: AC
  Filled 2017-10-13: qty 1

## 2017-10-13 MED ORDER — ONDANSETRON HCL 4 MG/2ML IJ SOLN
4.0000 mg | INTRAMUSCULAR | Status: DC | PRN
  Administered 2017-10-13: 4 mg via INTRAVENOUS
  Filled 2017-10-13: qty 2

## 2017-10-13 NOTE — ED Notes (Addendum)
Registration informed nurse that the patient walked out, by the time the nurse got to the door of the ED, pt had already walked through the parking lot and towards the street, informed EDP

## 2017-10-13 NOTE — ED Notes (Signed)
Went to inform pt that Dr. Fredderick Phenix will be in to talk to him and he is standing at the side of the bed, removed himself from the monitor and has taken out his IV with the IV fluid leaking all over floor. Pt is asking to be discharged. Informed pt that the doctor would be in to talk to him.
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
## 2017-10-13 NOTE — ED Notes (Signed)
Attempted two IV sticks without success 

## 2017-10-13 NOTE — ED Triage Notes (Signed)
Pt reports 3 days of his typical sickle cell crisis pain to his back and stomach, with nausea, rates at 7/10.

## 2017-10-13 NOTE — ED Notes (Signed)
Pt rang out asking for more pain medication, EDP informed and will go talk to him

## 2017-10-13 NOTE — ED Notes (Signed)
Pt asked what nausea medication he was going to be given, states zofran doesn't work for him, pt just ate some chili and drank gatorade without issue prior to nausea medication

## 2017-10-14 ENCOUNTER — Ambulatory Visit (INDEPENDENT_AMBULATORY_CARE_PROVIDER_SITE_OTHER): Payer: Medicaid Other | Admitting: Family Medicine

## 2017-10-14 ENCOUNTER — Encounter: Payer: Self-pay | Admitting: Family Medicine

## 2017-10-14 DIAGNOSIS — D5700 Hb-SS disease with crisis, unspecified: Secondary | ICD-10-CM | POA: Diagnosis not present

## 2017-10-14 MED ORDER — FOLIC ACID 1 MG PO TABS: 1.0000 mg | ORAL_TABLET | Freq: Every day | ORAL | 11 refills | Status: DC

## 2017-10-14 MED ORDER — OXYCODONE-ACETAMINOPHEN 10-325 MG PO TABS: 1.0000 | ORAL_TABLET | Freq: Three times a day (TID) | ORAL | 0 refills | Status: AC | PRN

## 2017-10-14 MED ORDER — HYDROXYUREA 500 MG PO CAPS: 1000.0000 mg | ORAL_CAPSULE | Freq: Every day | ORAL | 2 refills | Status: DC

## 2017-10-14 NOTE — Patient Instructions (Signed)
Sickle Cell Anemia, Adult °Sickle cell anemia is a condition where your red blood cells are shaped like sickles. Red blood cells carry oxygen through the body. Sickle-shaped red blood cells do not live as long as normal red blood cells. They also clump together and block blood from flowing through the blood vessels. These things prevent the body from getting enough oxygen. Sickle cell anemia causes organ damage and pain. It also increases the risk of infection. °Follow these instructions at home: °· Drink enough fluid to keep your pee (urine) clear or pale yellow. Drink more in hot weather and during exercise. °· Do not smoke. Smoking lowers oxygen levels in the blood. °· Only take over-the-counter or prescription medicines as told by your doctor. °· Take antibiotic medicines as told by your doctor. Make sure you finish them even if you start to feel better. °· Take supplements as told by your doctor. °· Consider wearing a medical alert bracelet. This tells anyone caring for you in an emergency of your condition. °· When traveling, keep your medical information, doctors' names, and the medicines you take with you at all times. °· If you have a fever, do not take fever medicines right away. This could cover up a problem. Tell your doctor. °· Keep all follow-up visits with your doctor. Sickle cell anemia requires regular medical care. °Contact a doctor if: °You have a fever. °Get help right away if: °· You feel dizzy or faint. °· You have new belly (abdominal) pain, especially on the left side near the stomach area. °· You have a lasting, often uncomfortable and painful erection of the penis (priapism). If it is not treated right away, you will become unable to have sex (impotence). °· You have numbness in your arms or legs or you have a hard time moving them. °· You have a hard time talking. °· You have a fever or lasting symptoms for more than 2-3 days. °· You have a fever and your symptoms suddenly get  worse. °· You have signs or symptoms of infection. These include: °? Chills. °? Being more tired than normal (lethargy). °? Irritability. °? Poor eating. °? Throwing up (vomiting). °· You have pain that is not helped with medicine. °· You have shortness of breath. °· You have pain in your chest. °· You are coughing up pus-like or bloody mucus. °· You have a stiff neck. °· Your feet or hands swell or have pain. °· Your belly looks bloated. °· Your joints hurt. °This information is not intended to replace advice given to you by your health care provider. Make sure you discuss any questions you have with your health care provider. °Document Released: 11/16/2012 Document Revised: 07/04/2015 Document Reviewed: 09/07/2012 °Elsevier Interactive Patient Education © 2017 Elsevier Inc. ° °
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
## 2017-10-18 ENCOUNTER — Ambulatory Visit: Payer: Self-pay | Admitting: Family Medicine

## 2017-10-18 ENCOUNTER — Telehealth: Payer: Self-pay

## 2017-10-18 NOTE — Telephone Encounter (Signed)
Waiting on his UDS lab results.

## 2017-10-19 ENCOUNTER — Other Ambulatory Visit: Payer: Self-pay | Admitting: Family Medicine

## 2017-10-19 DIAGNOSIS — D5700 Hb-SS disease with crisis, unspecified: Secondary | ICD-10-CM

## 2017-10-19 LAB — COCAINE CONF, UR
Benzoylecgonine GC/MS Conf: 2480 ng/mL
Cocaine Metab Quant, Ur: POSITIVE — AB

## 2017-10-19 LAB — DRUG SCREEN 764883 11+OXYCO+ALC+CRT-BUND
Amphetamines, Urine: NEGATIVE ng/mL
BENZODIAZ UR QL: NEGATIVE ng/mL
Barbiturate: NEGATIVE ng/mL
Cannabinoid Quant, Ur: NEGATIVE ng/mL
Creatinine: 49.5 mg/dL (ref 20.0–300.0)
Ethanol: NEGATIVE %
Meperidine: NEGATIVE ng/mL
Methadone Screen, Urine: NEGATIVE ng/mL
OPIATE SCREEN URINE: NEGATIVE ng/mL
Phencyclidine: NEGATIVE ng/mL
Propoxyphene: NEGATIVE ng/mL
Tramadol: NEGATIVE ng/mL
pH, Urine: 8.6 (ref 4.5–8.9)

## 2017-10-19 LAB — OXYCODONE/OXYMORPHONE, CONFIRM
OXYCODONE/OXYMORPH: POSITIVE — AB
OXYCODONE: NEGATIVE
OXYMORPHONE (GC/MS): 1068 ng/mL
OXYMORPHONE: POSITIVE — AB

## 2017-10-19 MED ORDER — OXYCODONE HCL 30 MG PO TABS: 30.0000 mg | ORAL_TABLET | Freq: Four times a day (QID) | ORAL | 0 refills | Status: AC | PRN

## 2017-10-19 NOTE — Telephone Encounter (Signed)
Patient called, complained of pain in the chest that he rated as 7/10. Last seen at the ER 'three days ago". Endorsed chest pain that does not travel down the arms, neck or jaw, denied fever, diarrhea and priapism. Admitted nausea/vomitting that has now subsided. Also admitted "little" abdominal pain on the left side. Admitted to having means of transportation without driving self after treatment. Last took pain medications three days ago because he ran out of pain medications. Provider notified. In my presence, the provider called the patient with instructions of what to do.

## 2017-10-19 NOTE — Progress Notes (Signed)
Rx for Oxy-IR 30 mg sent to pharmacy today.  

## 2017-11-01 ENCOUNTER — Telehealth (HOSPITAL_COMMUNITY): Payer: Self-pay | Admitting: *Deleted

## 2017-11-01 ENCOUNTER — Telehealth: Payer: Self-pay

## 2017-11-01 NOTE — Telephone Encounter (Signed)
Patient called requesting to come to day hospital for sickle cell pain. Patient reports pain in back and stomach rated 7/10. Reports being out of Oxycodone but called in for refill this morning.  Denies fever, chest pain, nausea, vomiting and priapism. Armeniahina, FNP notified and advised patient to get prescription filled and take pain medications around the clock as prescribed.  Patient's PCP, Sue LushAndrea, to  send prescription to pharmacy so patient can get it filled.  Patient advised and expresses an understanding.

## 2017-11-03 ENCOUNTER — Other Ambulatory Visit: Payer: Self-pay

## 2017-11-03 ENCOUNTER — Emergency Department (HOSPITAL_BASED_OUTPATIENT_CLINIC_OR_DEPARTMENT_OTHER): Payer: Medicaid Other

## 2017-11-03 ENCOUNTER — Emergency Department (HOSPITAL_BASED_OUTPATIENT_CLINIC_OR_DEPARTMENT_OTHER)
Admission: EM | Admit: 2017-11-03 | Discharge: 2017-11-03 | Discharge status: Against Medical Advice | Payer: Medicaid Other | Attending: Emergency Medicine | Admitting: Emergency Medicine

## 2017-11-03 ENCOUNTER — Encounter (HOSPITAL_BASED_OUTPATIENT_CLINIC_OR_DEPARTMENT_OTHER): Payer: Self-pay | Admitting: *Deleted

## 2017-11-03 DIAGNOSIS — G8929 Other chronic pain: Secondary | ICD-10-CM | POA: Diagnosis not present

## 2017-11-03 DIAGNOSIS — F17210 Nicotine dependence, cigarettes, uncomplicated: Secondary | ICD-10-CM | POA: Insufficient documentation

## 2017-11-03 DIAGNOSIS — Z79899 Other long term (current) drug therapy: Secondary | ICD-10-CM | POA: Diagnosis not present

## 2017-11-03 DIAGNOSIS — D5700 Hb-SS disease with crisis, unspecified: Secondary | ICD-10-CM | POA: Insufficient documentation

## 2017-11-03 LAB — COMPREHENSIVE METABOLIC PANEL
ALBUMIN: 4.6 g/dL (ref 3.5–5.0)
ALT: 10 U/L (ref 0–44)
AST: 26 U/L (ref 15–41)
Alkaline Phosphatase: 63 U/L (ref 38–126)
Anion gap: 7 (ref 5–15)
BUN: 9 mg/dL (ref 6–20)
CHLORIDE: 106 mmol/L (ref 98–111)
CO2: 25 mmol/L (ref 22–32)
CREATININE: 0.56 mg/dL — AB (ref 0.61–1.24)
Calcium: 8.9 mg/dL (ref 8.9–10.3)
GFR calc Af Amer: >60 mL/min (ref >60)
GFR calc non Af Amer: >60 mL/min (ref >60)
GLUCOSE: 85 mg/dL (ref 70–99)
Potassium: 3.9 mmol/L (ref 3.5–5.1)
SODIUM: 138 mmol/L (ref 135–145)
Total Bilirubin: 5.6 mg/dL — ABNORMAL HIGH (ref 0.3–1.2)
Total Protein: 7.9 g/dL (ref 6.5–8.1)

## 2017-11-03 LAB — CBC WITH DIFFERENTIAL/PLATELET
BASOS ABS: 0 10*3/uL (ref 0.0–0.1)
Basophils Relative: 0 %
Eosinophils Absolute: 0.3 10*3/uL (ref 0.0–0.7)
Eosinophils Relative: 2 %
HCT: 21.8 % — ABNORMAL LOW (ref 39.0–52.0)
Hemoglobin: 7.9 g/dL — ABNORMAL LOW (ref 13.0–17.0)
Lymphocytes Relative: 24 %
Lymphs Abs: 4.2 10*3/uL — ABNORMAL HIGH (ref 0.7–4.0)
MCH: 32.6 pg (ref 26.0–34.0)
MCHC: 36.2 g/dL — ABNORMAL HIGH (ref 30.0–36.0)
MCV: 90.1 fL (ref 78.0–100.0)
MONO ABS: 1.9 10*3/uL — AB (ref 0.1–1.0)
Monocytes Relative: 11 %
NEUTROS PCT: 63 %
Neutro Abs: 10.9 10*3/uL — ABNORMAL HIGH (ref 1.7–7.7)
Platelets: 324 10*3/uL (ref 150–400)
RBC: 2.42 MIL/uL — AB (ref 4.22–5.81)
RDW: 20 % — ABNORMAL HIGH (ref 11.5–15.5)
WBC: 17.3 10*3/uL — AB (ref 4.0–10.5)

## 2017-11-03 LAB — RETICULOCYTES
RBC.: 2.46 MIL/uL — ABNORMAL LOW (ref 4.22–5.81)
Retic Count, Absolute: 376.4 10*3/uL — ABNORMAL HIGH (ref 19.0–186.0)
Retic Ct Pct: 15.3 % — ABNORMAL HIGH (ref 0.4–3.1)

## 2017-11-03 MED ORDER — HYDROMORPHONE HCL 1 MG/ML IJ SOLN: 2.0000 mg | INTRAMUSCULAR | Status: DC

## 2017-11-03 MED ORDER — DIPHENHYDRAMINE HCL 25 MG PO CAPS: 25.0000 mg | ORAL_CAPSULE | ORAL | Status: DC | PRN

## 2017-11-03 MED ORDER — HYDROMORPHONE HCL 1 MG/ML IJ SOLN: 2.0000 mg | INTRAMUSCULAR | Status: AC

## 2017-11-03 MED ORDER — PROMETHAZINE HCL 25 MG PO TABS
25.0000 mg | ORAL_TABLET | Freq: Once | ORAL | Status: AC
  Administered 2017-11-03: 25 mg via ORAL
  Filled 2017-11-03: qty 1

## 2017-11-03 MED ORDER — HYDROMORPHONE HCL 1 MG/ML IJ SOLN
2.0000 mg | INTRAMUSCULAR | Status: AC
  Administered 2017-11-03: 2 mg via INTRAVENOUS
  Filled 2017-11-03: qty 2

## 2017-11-03 MED ORDER — DIPHENHYDRAMINE HCL 50 MG/ML IJ SOLN
25.0000 mg | Freq: Once | INTRAMUSCULAR | Status: AC
  Administered 2017-11-03: 25 mg via INTRAVENOUS
  Filled 2017-11-03: qty 1

## 2017-11-03 MED ORDER — ONDANSETRON HCL 4 MG/2ML IJ SOLN
4.0000 mg | INTRAMUSCULAR | Status: DC | PRN
  Administered 2017-11-03: 4 mg via INTRAVENOUS
  Filled 2017-11-03: qty 2

## 2017-11-03 MED ORDER — SODIUM CHLORIDE 0.45 % IV SOLN
INTRAVENOUS | Status: DC
  Administered 2017-11-03: 15:00:00 via INTRAVENOUS

## 2017-11-03 MED ORDER — METOCLOPRAMIDE HCL 5 MG/ML IJ SOLN
10.0000 mg | Freq: Once | INTRAMUSCULAR | Status: AC
  Administered 2017-11-03: 10 mg via INTRAVENOUS
  Filled 2017-11-03: qty 2

## 2017-11-03 MED ORDER — KETOROLAC TROMETHAMINE 30 MG/ML IJ SOLN
30.0000 mg | Freq: Once | INTRAMUSCULAR | Status: DC
  Filled 2017-11-03: qty 1

## 2017-11-03 NOTE — ED Notes (Signed)
Attempted x 2 to obtain IV access 

## 2017-11-03 NOTE — ED Notes (Signed)
Pt was texting on phone while RN attempted IV; NAD.

## 2017-11-03 NOTE — ED Notes (Signed)
Pt refused Toradol and requested to see EDP.

## 2017-11-03 NOTE — ED Notes (Signed)
Patient transported to X-ray 

## 2017-11-03 NOTE — ED Notes (Signed)
Pt vomited x1.  
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
## 2017-11-03 NOTE — ED Triage Notes (Addendum)
Brought in by EMS from [facility] , c/o chronic pain HX sickle cell, pt states she went to get his oxi refilled but was denied d/t ran out to soon and would not refill

## 2017-11-03 NOTE — ED Notes (Signed)
Pt requesting more pain and nausea medication. EDP aware.  

## 2017-11-03 NOTE — ED Notes (Signed)
RN in to reassess pain; pt not in room; IV catheter intact, lying on bed; gown on floor. EDP made aware.

## 2017-11-05 ENCOUNTER — Encounter (HOSPITAL_BASED_OUTPATIENT_CLINIC_OR_DEPARTMENT_OTHER): Payer: Self-pay | Admitting: Emergency Medicine

## 2017-11-05 ENCOUNTER — Emergency Department (HOSPITAL_BASED_OUTPATIENT_CLINIC_OR_DEPARTMENT_OTHER)
Admission: EM | Admit: 2017-11-05 | Discharge: 2017-11-05 | Disposition: A | Discharge status: Home / Self Care | Payer: Medicaid Other | Source: Home / Self Care | Attending: Emergency Medicine | Admitting: Emergency Medicine

## 2017-11-05 ENCOUNTER — Ambulatory Visit (HOSPITAL_COMMUNITY): Payer: Medicaid Other

## 2017-11-05 ENCOUNTER — Other Ambulatory Visit: Payer: Self-pay

## 2017-11-05 ENCOUNTER — Ambulatory Visit (INDEPENDENT_AMBULATORY_CARE_PROVIDER_SITE_OTHER): Payer: Medicaid Other | Admitting: Family Medicine

## 2017-11-05 ENCOUNTER — Telehealth (HOSPITAL_COMMUNITY): Payer: Self-pay | Admitting: General Practice

## 2017-11-05 ENCOUNTER — Encounter: Payer: Self-pay | Admitting: Family Medicine

## 2017-11-05 VITALS — BP 101/55 | HR 75 | Temp 98.9°F | Ht 70.0 in | Wt 123.0 lb

## 2017-11-05 DIAGNOSIS — G8929 Other chronic pain: Secondary | ICD-10-CM

## 2017-11-05 DIAGNOSIS — Z765 Malingerer [conscious simulation]: Secondary | ICD-10-CM | POA: Insufficient documentation

## 2017-11-05 DIAGNOSIS — D5700 Hb-SS disease with crisis, unspecified: Secondary | ICD-10-CM | POA: Diagnosis not present

## 2017-11-05 DIAGNOSIS — G894 Chronic pain syndrome: Secondary | ICD-10-CM | POA: Diagnosis not present

## 2017-11-05 DIAGNOSIS — R1011 Right upper quadrant pain: Secondary | ICD-10-CM | POA: Insufficient documentation

## 2017-11-05 DIAGNOSIS — R110 Nausea: Secondary | ICD-10-CM | POA: Insufficient documentation

## 2017-11-05 DIAGNOSIS — Z79899 Other long term (current) drug therapy: Secondary | ICD-10-CM | POA: Insufficient documentation

## 2017-11-05 DIAGNOSIS — F17210 Nicotine dependence, cigarettes, uncomplicated: Secondary | ICD-10-CM

## 2017-11-05 LAB — POCT URINALYSIS DIP (MANUAL ENTRY)
Bilirubin, UA: NEGATIVE
Glucose, UA: NEGATIVE mg/dL
Ketones, POC UA: NEGATIVE mg/dL
Leukocytes, UA: NEGATIVE
Nitrite, UA: NEGATIVE
Protein Ur, POC: NEGATIVE mg/dL
Spec Grav, UA: 1.015 (ref 1.010–1.025)
Urobilinogen, UA: 0.2 E.U./dL
pH, UA: 6.5 (ref 5.0–8.0)

## 2017-11-05 LAB — CBC WITH DIFFERENTIAL/PLATELET
Basophils Absolute: 0 10*3/uL (ref 0.0–0.1)
Basophils Relative: 0 %
Eosinophils Absolute: 0.3 10*3/uL (ref 0.0–0.7)
Eosinophils Relative: 2 %
HCT: 23.2 % — ABNORMAL LOW (ref 39.0–52.0)
Hemoglobin: 8.2 g/dL — ABNORMAL LOW (ref 13.0–17.0)
Lymphocytes Relative: 15 %
Lymphs Abs: 2.3 10*3/uL (ref 0.7–4.0)
MCH: 32.5 pg (ref 26.0–34.0)
MCHC: 35.3 g/dL (ref 30.0–36.0)
MCV: 92.1 fL (ref 78.0–100.0)
Monocytes Absolute: 1.7 10*3/uL — ABNORMAL HIGH (ref 0.1–1.0)
Monocytes Relative: 11 %
Neutro Abs: 11.2 10*3/uL — ABNORMAL HIGH (ref 1.7–7.7)
Neutrophils Relative %: 72 %
Platelets: 307 10*3/uL (ref 150–400)
RBC: 2.52 MIL/uL — ABNORMAL LOW (ref 4.22–5.81)
RDW: 20.8 % — ABNORMAL HIGH (ref 11.5–15.5)
WBC: 15.5 10*3/uL — ABNORMAL HIGH (ref 4.0–10.5)

## 2017-11-05 LAB — LIPASE, BLOOD: Lipase: 24 U/L (ref 11–51)

## 2017-11-05 MED ORDER — HYDROMORPHONE HCL 1 MG/ML IJ SOLN: 2.0000 mg | INTRAMUSCULAR | Status: DC

## 2017-11-05 MED ORDER — ONDANSETRON 4 MG PO TBDP
4.0000 mg | ORAL_TABLET | Freq: Once | ORAL | Status: AC
  Administered 2017-11-05: 4 mg via ORAL
  Filled 2017-11-05: qty 1

## 2017-11-05 MED ORDER — SODIUM CHLORIDE 0.45 % IV SOLN: INTRAVENOUS | Status: DC

## 2017-11-05 MED ORDER — KETOROLAC TROMETHAMINE 30 MG/ML IJ SOLN: 30.0000 mg | Freq: Once | INTRAMUSCULAR | Status: DC

## 2017-11-05 MED ORDER — OXYCODONE HCL 5 MG PO TABS
30.0000 mg | ORAL_TABLET | Freq: Once | ORAL | Status: AC
  Administered 2017-11-05: 30 mg via ORAL
  Filled 2017-11-05: qty 6

## 2017-11-05 MED ORDER — ONDANSETRON HCL 4 MG/2ML IJ SOLN: 4.0000 mg | INTRAMUSCULAR | Status: DC | PRN

## 2017-11-05 MED ORDER — KETOROLAC TROMETHAMINE 30 MG/ML IJ SOLN: 30.0000 mg | INTRAMUSCULAR | Status: DC
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
## 2017-11-05 NOTE — Discharge Instructions (Addendum)
Please go directly to the sickle cell clinic.  Please return the emergency department if you develop any new or worsening symptoms.

## 2017-11-05 NOTE — Progress Notes (Signed)
[facility]   Progress Note: General Provider: Mike Gip, FNP  SUBJECTIVE:   Matthew Shannon is a 27 y.o. male who  has a past medical history of Chronic pain syndrome, Cocaine abuse (HCC), Drug-seeking behavior, Sickle cell anemia (HCC), Substance abuse (HCC), and Tobacco dependence.. Patient presents today for Sickle Cell Pain Crisis (started with pain about 4 days ago, now level 7) and Medication Refill (oxycodone and advil 600mg )  Patient presents for refill on pain medications. Patient states that he went to [facility] ED this morning and left "because they were taking too long". Patient states that he then went [place] and was given one dose of medication. Per L. Hollis, NP, patient was advised to come to the day hospital. Patient did not come and is here for pain medications. Patient states that he has been in pain since Monday.  Review of Systems  Constitutional: Negative.   HENT: Negative.   Eyes: Negative.   Respiratory: Negative.   Cardiovascular: Negative.   Gastrointestinal: Negative.   Genitourinary: Negative.   Musculoskeletal: Positive for back pain, joint pain and myalgias.  Skin: Negative.   Neurological: Negative.   Psychiatric/Behavioral: Negative.      OBJECTIVE: BP (!) 101/55   Pulse 75   Temp 98.9 F (37.2 C) (Oral)   Ht 5\' 10"  (1.778 m)   Wt 123 lb (55.8 kg)   SpO2 92%   BMI 17.65 kg/m   Physical Exam  Constitutional: He is oriented to person, place, and time. He appears well-developed and well-nourished. No distress.  HENT:  Head: Normocephalic and atraumatic.  Eyes: Pupils are equal, round, and reactive to light. Conjunctivae and EOM are normal.  Neck: Normal range of motion.  Cardiovascular: Normal rate, regular rhythm, normal heart sounds and intact distal pulses.  Pulmonary/Chest: Effort normal and breath sounds normal. No respiratory distress.  Abdominal: Soft. Bowel sounds are  normal. He exhibits no distension.  Musculoskeletal: Normal range of motion.  Neurological: He is alert and oriented to person, place, and time.  Skin: Skin is warm and dry.  Psychiatric: He has a normal mood and affect. His behavior is normal. Judgment and thought content normal.  Patient is laying on the exam table in a darkened exam room.   Nursing note and vitals reviewed.   ASSESSMENT/PLAN:   1. Sickle cell anemia with pain (HCC) - POCT urinalysis dipstick Patient states that he was not informed to come the day hospital and to come for his regular appointment with this provider. Patient was offered IV fluids and ketorolac for acute pain at this visit.  He was also instructed that he could get a urine drug screen today in the office that would take approximately 25 minutes to complete.  He has been positive for cocaine in the past and was instructed that he would have to have a clean urine drug screensbefore receiving narcotic medication from this provider.  Patient initially denied cocaine use.  In the office today he states that, " I did not tell you I was positive because my baby's mother was listening while I was on the phone with you.  I have not done cocaine since that call."  He declined treatment in the office. He declined waiting for the UDS.  He states that he cannot stay because he has to get to [name] to see a sick son. Proceeded to show pictures from 10/24/2017 of his son in a neck brace in  a hospital bed.    Offered oral ibuprofen patient declined prescription. He stated that he is "tired of this runaround treatment". This provider explained the dangers of combining non-regulated, illegal street drugs with narcotics. Matthew Shannon left the office without accepting treatment.        The patient was given clear instructions to go to ER or return to medical center if symptoms do not improve, worsen or new problems develop. The patient verbalized understanding and agreed with plan  of care.   Matthew Shannon. Matthew Lam, FNP-BC [facility] [address] [place], [place] [address] [phone]     This note has been created with Dragon speech recognition software and smart phrase technology. Any transcriptional errors are unintentional.

## 2017-11-05 NOTE — Progress Notes (Signed)
Matthew Shannon, a 27 year old male with a history of sickle cell anemia hemoglobin SS, drug seeking behavior,  polysubstance abuse, and chronic pain syndrome was seen in primary care for chronic pain and possible sickle cell crisis.  Patient called sickle cell day hospital earlier this a.m. and was offered admission for acute sickle cell pain crisis.  Patient did not show up for treatment.  However, he was treated in the ER at both [facility] and [facility] for sickle cell crisis.  Patient showed up with primary care appointment and was offered treatment in the day infusion center at that time.  Patient refused treatment and did not want to await drug screen in order to receive prescription pain medications from primary provider.    [name]  APRN, MSN, FNP-C [facility] [address] [place], [place] [address] [phone]

## 2017-11-05 NOTE — ED Triage Notes (Signed)
Pt brought by [facility] from home for c/o generalized fatigue and pain 7/10 for the past 4 days not getting better. SR on the monitor, HR 94, R-16, BP 118/64 98% on RA.

## 2017-11-05 NOTE — ED Triage Notes (Signed)
This is pt's 3rd ED visit today. Pt received multiple meds at this facility earlier. Pt has care plan.

## 2017-11-05 NOTE — ED Triage Notes (Addendum)
Reports to ED for left side pain x 4 days. States he has still been unable to obtain his medications.

## 2017-11-05 NOTE — Telephone Encounter (Signed)
Patient called, complained of pain in the "stomach" and back that he rated as 7/10. Denied chest pain, fever, diarrhea, priapism and vomitting. Endorsed some nausea. Admitted to having means of transportation without driving self after treatment. Per patient, patient called in his pain prescription on 11/01/2017. Has contacted the doctor's office and the pharmacy with no medication available for pick up. Was last seen at [facility] room "yesterday".  Has appointment at 02:00 pm today with his provider. Does not think he can come for the appointment. Provider notified; per provider, patient can come in for treatment. Should try to be here before 11:30 a.m. so he can have a full day's treatment. Patient notified, verbalized understanding.

## 2017-11-06 ENCOUNTER — Emergency Department (HOSPITAL_BASED_OUTPATIENT_CLINIC_OR_DEPARTMENT_OTHER)
Admission: EM | Admit: 2017-11-06 | Discharge: 2017-11-06 | Disposition: A | Discharge status: Home / Self Care | Payer: Medicaid Other | Source: Home / Self Care | Attending: Emergency Medicine | Admitting: Emergency Medicine

## 2017-11-06 ENCOUNTER — Other Ambulatory Visit: Payer: Self-pay

## 2017-11-06 ENCOUNTER — Emergency Department (HOSPITAL_BASED_OUTPATIENT_CLINIC_OR_DEPARTMENT_OTHER): Payer: Medicaid Other

## 2017-11-06 ENCOUNTER — Inpatient Hospital Stay (HOSPITAL_BASED_OUTPATIENT_CLINIC_OR_DEPARTMENT_OTHER)
Admission: EM | Admit: 2017-11-06 | Discharge: 2017-11-07 | DRG: 811 | Disposition: A | Discharge status: Home / Self Care | Payer: Medicaid Other | Attending: Internal Medicine | Admitting: Internal Medicine

## 2017-11-06 ENCOUNTER — Encounter (HOSPITAL_BASED_OUTPATIENT_CLINIC_OR_DEPARTMENT_OTHER): Payer: Self-pay | Admitting: Emergency Medicine

## 2017-11-06 DIAGNOSIS — D72829 Elevated white blood cell count, unspecified: Secondary | ICD-10-CM | POA: Diagnosis present

## 2017-11-06 DIAGNOSIS — J181 Lobar pneumonia, unspecified organism: Secondary | ICD-10-CM

## 2017-11-06 DIAGNOSIS — G894 Chronic pain syndrome: Secondary | ICD-10-CM | POA: Diagnosis present

## 2017-11-06 DIAGNOSIS — Y95 Nosocomial condition: Secondary | ICD-10-CM | POA: Diagnosis present

## 2017-11-06 DIAGNOSIS — F17210 Nicotine dependence, cigarettes, uncomplicated: Secondary | ICD-10-CM | POA: Diagnosis present

## 2017-11-06 DIAGNOSIS — F1910 Other psychoactive substance abuse, uncomplicated: Secondary | ICD-10-CM | POA: Diagnosis present

## 2017-11-06 DIAGNOSIS — Z881 Allergy status to other antibiotic agents status: Secondary | ICD-10-CM | POA: Diagnosis not present

## 2017-11-06 DIAGNOSIS — D5700 Hb-SS disease with crisis, unspecified: Principal | ICD-10-CM | POA: Diagnosis present

## 2017-11-06 DIAGNOSIS — Z720 Tobacco use: Secondary | ICD-10-CM | POA: Diagnosis present

## 2017-11-06 DIAGNOSIS — J189 Pneumonia, unspecified organism: Secondary | ICD-10-CM | POA: Diagnosis not present

## 2017-11-06 DIAGNOSIS — J440 Chronic obstructive pulmonary disease with acute lower respiratory infection: Secondary | ICD-10-CM | POA: Diagnosis present

## 2017-11-06 DIAGNOSIS — Z79899 Other long term (current) drug therapy: Secondary | ICD-10-CM | POA: Diagnosis not present

## 2017-11-06 DIAGNOSIS — Z79891 Long term (current) use of opiate analgesic: Secondary | ICD-10-CM | POA: Diagnosis not present

## 2017-11-06 DIAGNOSIS — D72823 Leukemoid reaction: Secondary | ICD-10-CM | POA: Diagnosis not present

## 2017-11-06 DIAGNOSIS — Z880 Allergy status to penicillin: Secondary | ICD-10-CM | POA: Diagnosis not present

## 2017-11-06 LAB — CBC WITH DIFFERENTIAL/PLATELET
Basophils Absolute: 0.1 10*3/uL (ref 0.0–0.1)
Basophils Relative: 0 %
EOS ABS: 0.5 10*3/uL (ref 0.0–0.7)
Eosinophils Relative: 3 %
HEMATOCRIT: 22.2 % — AB (ref 39.0–52.0)
Hemoglobin: 7.9 g/dL — ABNORMAL LOW (ref 13.0–17.0)
LYMPHS ABS: 4.3 10*3/uL (ref 0.7–4.0)
Lymphocytes Relative: 25 %
MCH: 32.8 pg (ref 26.0–34.0)
MCHC: 35.6 g/dL (ref 30.0–36.0)
MCV: 92.1 fL (ref 78.0–100.0)
MONOS PCT: 11 %
Monocytes Absolute: 1.9 10*3/uL (ref 0.1–1.0)
Neutro Abs: 10.4 10*3/uL (ref 1.7–7.7)
Neutrophils Relative %: 61 %
PLATELETS: 318 10*3/uL (ref 150–400)
RBC: 2.41 MIL/uL — AB (ref 4.22–5.81)
RDW: 20.3 % — AB (ref 11.5–15.5)
WBC: 17.2 10*3/uL — AB (ref 4.0–10.5)

## 2017-11-06 LAB — COMPREHENSIVE METABOLIC PANEL
ALT: 13 U/L (ref 0–44)
AST: 25 U/L (ref 15–41)
Albumin: 4.5 g/dL (ref 3.5–5.0)
Alkaline Phosphatase: 63 U/L (ref 38–126)
Anion gap: 10 (ref 5–15)
BILIRUBIN TOTAL: 5 mg/dL — AB (ref 0.3–1.2)
BUN: 8 mg/dL (ref 6–20)
CHLORIDE: 102 mmol/L (ref 98–111)
CO2: 25 mmol/L (ref 22–32)
CREATININE: 0.51 mg/dL — AB (ref 0.61–1.24)
Calcium: 9.2 mg/dL (ref 8.9–10.3)
GFR calc Af Amer: >60 mL/min (ref >60)
Glucose, Bld: 93 mg/dL (ref 70–99)
POTASSIUM: 4 mmol/L (ref 3.5–5.1)
Sodium: 137 mmol/L (ref 135–145)
Total Protein: 7.8 g/dL (ref 6.5–8.1)

## 2017-11-06 LAB — CREATININE, SERUM
CREATININE: 0.64 mg/dL (ref 0.61–1.24)
GFR calc Af Amer: >60 mL/min (ref >60)

## 2017-11-06 LAB — LIPASE, BLOOD: LIPASE: 18 U/L (ref 11–51)

## 2017-11-06 LAB — CBC
HEMATOCRIT: 22.2 % — AB (ref 39.0–52.0)
HEMOGLOBIN: 7.8 g/dL — AB (ref 13.0–17.0)
MCH: 33.1 pg (ref 26.0–34.0)
MCHC: 35.1 g/dL (ref 30.0–36.0)
MCV: 94.1 fL (ref 78.0–100.0)
Platelets: 316 10*3/uL (ref 150–400)
RBC: 2.36 MIL/uL — ABNORMAL LOW (ref 4.22–5.81)
RDW: 21.4 % — ABNORMAL HIGH (ref 11.5–15.5)
WBC: 11.5 10*3/uL — ABNORMAL HIGH (ref 4.0–10.5)

## 2017-11-06 LAB — I-STAT CG4 LACTIC ACID, ED: Lactic Acid, Venous: 0.67 mmol/L (ref 0.5–1.9)

## 2017-11-06 LAB — D-DIMER, QUANTITATIVE: D-Dimer, Quant: 0.42 ug/mL-FEU (ref 0.00–0.50)

## 2017-11-06 LAB — RETICULOCYTES
RBC.: 2.37 MIL/uL — AB (ref 4.22–5.81)
RETIC COUNT ABSOLUTE: 450.3 10*3/uL — AB (ref 19.0–186.0)
Retic Ct Pct: 19 % — ABNORMAL HIGH (ref 0.4–3.1)

## 2017-11-06 MED ORDER — SENNOSIDES-DOCUSATE SODIUM 8.6-50 MG PO TABS
1.0000 | ORAL_TABLET | Freq: Two times a day (BID) | ORAL | Status: DC
  Administered 2017-11-07: 1 via ORAL
  Filled 2017-11-06: qty 1

## 2017-11-06 MED ORDER — SODIUM CHLORIDE 0.9% FLUSH: 9.0000 mL | INTRAVENOUS | Status: DC | PRN

## 2017-11-06 MED ORDER — FOLIC ACID 1 MG PO TABS
1.0000 mg | ORAL_TABLET | Freq: Every day | ORAL | Status: DC
  Administered 2017-11-07: 1 mg via ORAL
  Filled 2017-11-06: qty 1

## 2017-11-06 MED ORDER — DEXTROSE-NACL 5-0.45 % IV SOLN
INTRAVENOUS | Status: DC
  Administered 2017-11-06 – 2017-11-07 (×4): via INTRAVENOUS

## 2017-11-06 MED ORDER — ENOXAPARIN SODIUM 40 MG/0.4ML ~~LOC~~ SOLN: 40.0000 mg | SUBCUTANEOUS | Status: DC

## 2017-11-06 MED ORDER — POLYETHYLENE GLYCOL 3350 17 G PO PACK: 17.0000 g | PACK | Freq: Every day | ORAL | Status: DC | PRN

## 2017-11-06 MED ORDER — LEVOFLOXACIN IN D5W 750 MG/150ML IV SOLN
750.0000 mg | INTRAVENOUS | Status: DC
  Administered 2017-11-07: 750 mg via INTRAVENOUS
  Filled 2017-11-06: qty 150

## 2017-11-06 MED ORDER — ONDANSETRON HCL 4 MG/2ML IJ SOLN
4.0000 mg | Freq: Once | INTRAMUSCULAR | Status: AC
  Administered 2017-11-06: 4 mg via INTRAVENOUS
  Filled 2017-11-06: qty 2

## 2017-11-06 MED ORDER — NALOXONE HCL 0.4 MG/ML IJ SOLN: 0.4000 mg | INTRAMUSCULAR | Status: DC | PRN

## 2017-11-06 MED ORDER — KETOROLAC TROMETHAMINE 30 MG/ML IJ SOLN
30.0000 mg | Freq: Four times a day (QID) | INTRAMUSCULAR | Status: DC
  Administered 2017-11-06 – 2017-11-07 (×4): 30 mg via INTRAVENOUS
  Filled 2017-11-06 (×4): qty 1

## 2017-11-06 MED ORDER — DIPHENHYDRAMINE HCL 25 MG PO CAPS
25.0000 mg | ORAL_CAPSULE | Freq: Once | ORAL | Status: AC
  Administered 2017-11-06: 25 mg via ORAL
  Filled 2017-11-06: qty 1

## 2017-11-06 MED ORDER — OXYCODONE HCL 5 MG PO TABS
30.0000 mg | ORAL_TABLET | Freq: Once | ORAL | Status: AC
  Administered 2017-11-06: 30 mg via ORAL
  Filled 2017-11-06: qty 6

## 2017-11-06 MED ORDER — DIPHENHYDRAMINE HCL 50 MG/ML IJ SOLN: 12.5000 mg | Freq: Four times a day (QID) | INTRAMUSCULAR | Status: DC | PRN

## 2017-11-06 MED ORDER — PROMETHAZINE HCL 25 MG/ML IJ SOLN
12.5000 mg | Freq: Once | INTRAMUSCULAR | Status: AC
  Administered 2017-11-06: 12.5 mg via INTRAVENOUS
  Filled 2017-11-06: qty 1

## 2017-11-06 MED ORDER — PROMETHAZINE HCL 25 MG PO TABS
25.0000 mg | ORAL_TABLET | Freq: Four times a day (QID) | ORAL | Status: DC | PRN
  Administered 2017-11-06: 25 mg via ORAL
  Filled 2017-11-06 (×2): qty 1

## 2017-11-06 MED ORDER — ONDANSETRON HCL 4 MG/2ML IJ SOLN: 4.0000 mg | Freq: Four times a day (QID) | INTRAMUSCULAR | Status: DC | PRN

## 2017-11-06 MED ORDER — PROMETHAZINE HCL 25 MG PO TABS: 12.5000 mg | ORAL_TABLET | Freq: Four times a day (QID) | ORAL | Status: DC | PRN

## 2017-11-06 MED ORDER — OXYCODONE HCL 5 MG PO TABS
30.0000 mg | ORAL_TABLET | ORAL | Status: DC | PRN
  Administered 2017-11-06 – 2017-11-07 (×2): 30 mg via ORAL
  Filled 2017-11-06 (×3): qty 6

## 2017-11-06 MED ORDER — HYDROXYUREA 500 MG PO CAPS
1000.0000 mg | ORAL_CAPSULE | Freq: Every day | ORAL | Status: DC
  Administered 2017-11-07: 1000 mg via ORAL
  Filled 2017-11-06: qty 2

## 2017-11-06 MED ORDER — HYDROMORPHONE 1 MG/ML IV SOLN
INTRAVENOUS | Status: DC
  Administered 2017-11-06: 17:00:00 via INTRAVENOUS
  Administered 2017-11-06: 1.4 mg via INTRAVENOUS
  Administered 2017-11-06: 0.5 mg via INTRAVENOUS
  Administered 2017-11-07: 2.4 mg via INTRAVENOUS
  Administered 2017-11-07: 2.7 mg via INTRAVENOUS
  Administered 2017-11-07: 6 mg via INTRAVENOUS
  Administered 2017-11-07: 1.5 mg via INTRAVENOUS
  Filled 2017-11-06: qty 25

## 2017-11-06 MED ORDER — OXYCODONE HCL 5 MG PO TABS
10.0000 mg | ORAL_TABLET | ORAL | Status: DC
  Administered 2017-11-06 – 2017-11-07 (×4): 10 mg via ORAL
  Filled 2017-11-06 (×4): qty 2

## 2017-11-06 MED ORDER — ONDANSETRON 8 MG PO TBDP
8.0000 mg | ORAL_TABLET | Freq: Once | ORAL | Status: AC
  Administered 2017-11-06: 8 mg via ORAL
  Filled 2017-11-06: qty 1

## 2017-11-06 MED ORDER — KETOROLAC TROMETHAMINE 15 MG/ML IJ SOLN
15.0000 mg | Freq: Once | INTRAMUSCULAR | Status: AC
  Administered 2017-11-06: 15 mg via INTRAVENOUS
  Filled 2017-11-06: qty 1

## 2017-11-06 MED ORDER — LEVOFLOXACIN IN D5W 750 MG/150ML IV SOLN
750.0000 mg | Freq: Once | INTRAVENOUS | Status: AC
  Administered 2017-11-06: 750 mg via INTRAVENOUS
  Filled 2017-11-06: qty 150

## 2017-11-06 MED ORDER — DIPHENHYDRAMINE HCL 12.5 MG/5ML PO ELIX
12.5000 mg | ORAL_SOLUTION | Freq: Four times a day (QID) | ORAL | Status: DC | PRN
  Administered 2017-11-06: 12.5 mg via ORAL
  Filled 2017-11-06: qty 5

## 2017-11-06 NOTE — ED Notes (Signed)
Attempted to call report to floor. They will call back when RN is able to take report

## 2017-11-06 NOTE — Progress Notes (Signed)
Pt is reporting nausea, but is not actively vomiting. Zofran offered and pt refused. md notified for request of phenergan.

## 2017-11-06 NOTE — Progress Notes (Signed)
Pt. arrived to floor from [facility] via stretcher. No respiratory distress noted.

## 2017-11-06 NOTE — ED Triage Notes (Signed)
Patient states that he went to his Sickle cell clinic appointment yesterday and nothing is helping. He reports that he continues to have pain to his stomach with N/V

## 2017-11-06 NOTE — ED Notes (Signed)
REport given to [facility] by Michelle Piper
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
## 2017-11-06 NOTE — ED Notes (Signed)
[facility] at bedside 
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
## 2017-11-07 DIAGNOSIS — J181 Lobar pneumonia, unspecified organism: Secondary | ICD-10-CM

## 2017-11-07 LAB — URINALYSIS, ROUTINE W REFLEX MICROSCOPIC
Bilirubin Urine: NEGATIVE
Glucose, UA: NEGATIVE mg/dL
Hgb urine dipstick: NEGATIVE
KETONES UR: NEGATIVE mg/dL
LEUKOCYTES UA: NEGATIVE
Nitrite: NEGATIVE
PH: 6 (ref 5.0–8.0)
PROTEIN: NEGATIVE mg/dL
SPECIFIC GRAVITY, URINE: 1.006 (ref 1.005–1.030)

## 2017-11-07 LAB — COMPREHENSIVE METABOLIC PANEL
ALT: 11 U/L (ref 0–44)
AST: 26 U/L (ref 15–41)
Albumin: 3.7 g/dL (ref 3.5–5.0)
Alkaline Phosphatase: 54 U/L (ref 38–126)
Anion gap: 7 (ref 5–15)
BUN: 7 mg/dL (ref 6–20)
CHLORIDE: 109 mmol/L (ref 98–111)
CO2: 23 mmol/L (ref 22–32)
Calcium: 8.6 mg/dL — ABNORMAL LOW (ref 8.9–10.3)
Creatinine, Ser: 0.52 mg/dL — ABNORMAL LOW (ref 0.61–1.24)
Glucose, Bld: 106 mg/dL — ABNORMAL HIGH (ref 70–99)
POTASSIUM: 4.1 mmol/L (ref 3.5–5.1)
SODIUM: 139 mmol/L (ref 135–145)
Total Bilirubin: 4.4 mg/dL — ABNORMAL HIGH (ref 0.3–1.2)
Total Protein: 6.6 g/dL (ref 6.5–8.1)

## 2017-11-07 LAB — CBC WITH DIFFERENTIAL/PLATELET
BASOS PCT: 1 %
Basophils Absolute: 0.1 10*3/uL (ref 0.0–0.1)
EOS PCT: 3 %
Eosinophils Absolute: 0.4 10*3/uL (ref 0.0–0.7)
HEMATOCRIT: 21.1 % — AB (ref 39.0–52.0)
HEMOGLOBIN: 7.2 g/dL — AB (ref 13.0–17.0)
LYMPHS PCT: 28 %
Lymphs Abs: 3.9 10*3/uL (ref 0.7–4.0)
MCH: 32.4 pg (ref 26.0–34.0)
MCHC: 34.1 g/dL (ref 30.0–36.0)
MCV: 95 fL (ref 78.0–100.0)
MONOS PCT: 11 %
Monocytes Absolute: 1.6 10*3/uL — ABNORMAL HIGH (ref 0.1–1.0)
NEUTROS PCT: 57 %
Neutro Abs: 8.1 10*3/uL — ABNORMAL HIGH (ref 1.7–7.7)
Platelets: 230 10*3/uL (ref 150–400)
RBC: 2.22 MIL/uL — AB (ref 4.22–5.81)
RDW: 21.5 % — ABNORMAL HIGH (ref 11.5–15.5)
WBC: 14.1 10*3/uL — AB (ref 4.0–10.5)

## 2017-11-07 LAB — HIV ANTIBODY (ROUTINE TESTING W REFLEX): HIV SCREEN 4TH GENERATION: NONREACTIVE

## 2017-11-07 MED ORDER — OXYCODONE HCL 30 MG PO TABS: 30.0000 mg | ORAL_TABLET | ORAL | 0 refills | Status: DC | PRN

## 2017-11-07 MED ORDER — LEVOFLOXACIN 750 MG PO TABS: 750.0000 mg | ORAL_TABLET | Freq: Every day | ORAL | 0 refills | Status: DC

## 2017-11-07 NOTE — Discharge Summary (Signed)
Physician Discharge Summary  Patient ID: Matthew Shannon MRN: [id number] DOB/AGE: [birth year] 29 y.o.  Admit date: 11/06/2017 Discharge date: 11/07/2017  Admission Diagnoses:  Discharge Diagnoses:  Principal Problem:   Community acquired pneumonia Active Problems:   Sickle cell pain crisis (HCC)   Tobacco abuse   Leukocytosis   Sickle cell disease with crisis (HCC)   Pneumonia   Discharged Condition: good  Hospital Course: Patient is a 27 year old gentleman transferred [facility] [place] [facility] with sickle cell anemia and crisis but also has findings suspected to be community-acquired pneumonia.  He was having cough mild fever leukocytosis.  Patient was admitted and started on IV Levaquin.  Cultures were sent.  He did not have any worsening shortness of breath.  He was not coughing during normal.  No fever in the hospital.  He was transitioned to oral Levaquin and discharged home.  He is a smoker also has some mild COPD but no exacerbation.  His sickle cell pain was treated with IV Dilaudid PCA and Toradol.  At time of discharge patient has done much better.  His leukocytosis which is chronic has improved.  He was transitioned to his home regimen of pain medications and prescription was given until he is seen by his primary care physician.  Consults: None  Significant Diagnostic Studies: labs: Chest x-ray, serial CBCs and CMP's were checked  Treatments: IV hydration, antibiotics: Levaquin and analgesia: acetaminophen and Dilaudid  Discharge Exam: Blood pressure (!) 98/54, pulse 74, temperature 98 F (36.7 C), temperature source Oral, resp. rate 16, height 5\' 10"  (1.778 m), weight 57.1 kg, SpO2 97 %. General appearance: alert Head: Normocephalic, without obvious abnormality, atraumatic Back: symmetric, no curvature. ROM normal. No CVA tenderness. Resp: clear to auscultation bilaterally Chest wall: no tenderness Cardio: regular rate and rhythm, S1, S2 normal, no murmur, click, rub or  gallop GI: soft, non-tender; bowel sounds normal; no masses,  no organomegaly Extremities: extremities normal, atraumatic, no cyanosis or edema Pulses: 2+ and symmetric Skin: Skin color, texture, turgor normal. No rashes or lesions Neurologic: Grossly normal  Disposition:    Allergies as of 11/07/2017      Reactions   Ceftriaxone Itching, Other (See Comments), Shortness Of Breath   Tolerates pip/tazo  Other reaction(s): Cough (ALLERGY/intolerance) Tolerates pip/tazo  Tolerates pip/tazo  Other reaction(s): Cough (ALLERGY/intolerance) Tolerates pip/tazo  Tolerates pip/tazo  Tolerates pip/tazo  Tolerates pip/tazo  Other reaction(s): Cough (ALLERGY/intolerance) Tolerates pip/tazo  Tolerates pip/tazo  Tolerates pip/tazo    Zosyn [piperacillin Sod-tazobactam So] Shortness Of Breath      Medication List    TAKE these medications   folic acid 1 MG tablet Commonly known as:  FOLVITE Take 1 tablet (1 mg total) by mouth daily.   hydroxyurea 500 MG capsule Commonly known as:  HYDREA Take 2 capsules (1,000 mg total) by mouth daily. May take with food to minimize GI side effects.   ibuprofen 600 MG tablet Commonly known as:  ADVIL,MOTRIN Take 1 tablet (600 mg total) by mouth every 8 (eight) hours as needed.   levofloxacin 750 MG tablet Commonly known as:  LEVAQUIN Take 1 tablet (750 mg total) by mouth daily for 7 days.   oxycodone 30 MG immediate release tablet Commonly known as:  ROXICODONE Take 1 tablet (30 mg total) by mouth every 4 (four) hours as needed for pain.        SignedLonia Blood 11/07/2017, 12:01 PM   Time spent 31 minutes

## 2017-11-11 ENCOUNTER — Encounter (HOSPITAL_BASED_OUTPATIENT_CLINIC_OR_DEPARTMENT_OTHER): Payer: Self-pay

## 2017-11-11 ENCOUNTER — Inpatient Hospital Stay (HOSPITAL_BASED_OUTPATIENT_CLINIC_OR_DEPARTMENT_OTHER)
Admission: EM | Admit: 2017-11-11 | Discharge: 2017-11-14 | DRG: 812 | Disposition: A | Discharge status: Home / Self Care | Payer: Medicaid Other | Attending: Internal Medicine | Admitting: Internal Medicine

## 2017-11-11 ENCOUNTER — Emergency Department (HOSPITAL_BASED_OUTPATIENT_CLINIC_OR_DEPARTMENT_OTHER): Payer: Medicaid Other

## 2017-11-11 ENCOUNTER — Other Ambulatory Visit: Payer: Self-pay

## 2017-11-11 DIAGNOSIS — D5700 Hb-SS disease with crisis, unspecified: Principal | ICD-10-CM | POA: Diagnosis present

## 2017-11-11 DIAGNOSIS — Z9119 Patient's noncompliance with other medical treatment and regimen: Secondary | ICD-10-CM

## 2017-11-11 DIAGNOSIS — F17210 Nicotine dependence, cigarettes, uncomplicated: Secondary | ICD-10-CM | POA: Diagnosis not present

## 2017-11-11 DIAGNOSIS — Z832 Family history of diseases of the blood and blood-forming organs and certain disorders involving the immune mechanism: Secondary | ICD-10-CM | POA: Diagnosis not present

## 2017-11-11 DIAGNOSIS — Z888 Allergy status to other drugs, medicaments and biological substances status: Secondary | ICD-10-CM

## 2017-11-11 DIAGNOSIS — G894 Chronic pain syndrome: Secondary | ICD-10-CM | POA: Diagnosis present

## 2017-11-11 DIAGNOSIS — F1910 Other psychoactive substance abuse, uncomplicated: Secondary | ICD-10-CM | POA: Diagnosis not present

## 2017-11-11 DIAGNOSIS — Z79899 Other long term (current) drug therapy: Secondary | ICD-10-CM | POA: Diagnosis not present

## 2017-11-11 DIAGNOSIS — Z79891 Long term (current) use of opiate analgesic: Secondary | ICD-10-CM

## 2017-11-11 DIAGNOSIS — D72829 Elevated white blood cell count, unspecified: Secondary | ICD-10-CM

## 2017-11-11 LAB — CREATININE, SERUM
Creatinine, Ser: 0.61 mg/dL (ref 0.61–1.24)
GFR calc non Af Amer: >60 mL/min (ref >60)

## 2017-11-11 LAB — COMPREHENSIVE METABOLIC PANEL
ALT: 15 U/L (ref 0–44)
ANION GAP: 8 (ref 5–15)
AST: 38 U/L (ref 15–41)
Albumin: 4.5 g/dL (ref 3.5–5.0)
Alkaline Phosphatase: 64 U/L (ref 38–126)
BUN: 9 mg/dL (ref 6–20)
CHLORIDE: 102 mmol/L (ref 98–111)
CO2: 26 mmol/L (ref 22–32)
Calcium: 9.5 mg/dL (ref 8.9–10.3)
Creatinine, Ser: 0.51 mg/dL — ABNORMAL LOW (ref 0.61–1.24)
GFR calc non Af Amer: >60 mL/min (ref >60)
Glucose, Bld: 76 mg/dL (ref 70–99)
POTASSIUM: 3.7 mmol/L (ref 3.5–5.1)
Sodium: 136 mmol/L (ref 135–145)
Total Bilirubin: 5.9 mg/dL — ABNORMAL HIGH (ref 0.3–1.2)
Total Protein: 8.4 g/dL — ABNORMAL HIGH (ref 6.5–8.1)

## 2017-11-11 LAB — CBC
HEMATOCRIT: 17.9 % — AB (ref 39.0–52.0)
Hemoglobin: 6.2 g/dL — CL (ref 13.0–17.0)
MCH: 31.2 pg (ref 26.0–34.0)
MCHC: 34.6 g/dL (ref 30.0–36.0)
MCV: 89.9 fL (ref 78.0–100.0)
Platelets: 261 10*3/uL (ref 150–400)
RBC: 1.99 MIL/uL — AB (ref 4.22–5.81)
RDW: 21.8 % — ABNORMAL HIGH (ref 11.5–15.5)
WBC: 13.3 10*3/uL — AB (ref 4.0–10.5)

## 2017-11-11 LAB — CBC WITH DIFFERENTIAL/PLATELET
Basophils Absolute: 0.1 10*3/uL (ref 0.0–0.1)
Basophils Relative: 0 %
EOS ABS: 0.5 10*3/uL (ref 0.0–0.7)
EOS PCT: 3 %
HCT: 19.3 % — ABNORMAL LOW (ref 39.0–52.0)
HEMOGLOBIN: 7 g/dL — AB (ref 13.0–17.0)
LYMPHS ABS: 3.5 10*3/uL (ref 0.7–4.0)
LYMPHS PCT: 19 %
MCH: 32.4 pg (ref 26.0–34.0)
MCHC: 36.3 g/dL — AB (ref 30.0–36.0)
MCV: 89.4 fL (ref 78.0–100.0)
MONOS PCT: 14 %
Monocytes Absolute: 2.5 10*3/uL (ref 0.1–1.0)
Neutro Abs: 11.6 10*3/uL (ref 1.7–7.7)
Neutrophils Relative %: 64 %
PLATELETS: 294 10*3/uL (ref 150–400)
RBC: 2.16 MIL/uL — ABNORMAL LOW (ref 4.22–5.81)
RDW: 20.8 % — AB (ref 11.5–15.5)
WBC: 18.2 10*3/uL — ABNORMAL HIGH (ref 4.0–10.5)

## 2017-11-11 LAB — HEMOGLOBIN AND HEMATOCRIT, BLOOD
HCT: 20.3 % — ABNORMAL LOW (ref 39.0–52.0)
Hemoglobin: 7.2 g/dL — ABNORMAL LOW (ref 13.0–17.0)

## 2017-11-11 LAB — RAPID URINE DRUG SCREEN, HOSP PERFORMED
Amphetamines: NOT DETECTED
Barbiturates: NOT DETECTED
Benzodiazepines: NOT DETECTED
Cocaine: NOT DETECTED
Opiates: POSITIVE — AB
TETRAHYDROCANNABINOL: NOT DETECTED

## 2017-11-11 LAB — PREPARE RBC (CROSSMATCH)

## 2017-11-11 LAB — D-DIMER, QUANTITATIVE (NOT AT ARMC): D DIMER QUANT: 0.38 ug{FEU}/mL (ref 0.00–0.50)

## 2017-11-11 LAB — RETICULOCYTES
RBC.: 2.11 MIL/uL — AB (ref 4.22–5.81)
RETIC COUNT ABSOLUTE: 432.6 10*3/uL — AB (ref 19.0–186.0)
Retic Ct Pct: 20.5 % — ABNORMAL HIGH (ref 0.4–3.1)

## 2017-11-11 MED ORDER — DIPHENHYDRAMINE HCL 25 MG PO CAPS
25.0000 mg | ORAL_CAPSULE | Freq: Once | ORAL | Status: AC
  Administered 2017-11-11: 25 mg via ORAL
  Filled 2017-11-11: qty 1

## 2017-11-11 MED ORDER — NALOXONE HCL 0.4 MG/ML IJ SOLN: 0.4000 mg | INTRAMUSCULAR | Status: DC | PRN

## 2017-11-11 MED ORDER — HYDROMORPHONE HCL 1 MG/ML IJ SOLN
1.0000 mg | Freq: Once | INTRAMUSCULAR | Status: AC
  Administered 2017-11-11: 1 mg via INTRAVENOUS
  Filled 2017-11-11: qty 1

## 2017-11-11 MED ORDER — SODIUM CHLORIDE 0.9% IV SOLUTION
Freq: Once | INTRAVENOUS | Status: AC
  Administered 2017-11-11: 15:00:00 via INTRAVENOUS

## 2017-11-11 MED ORDER — HYDROMORPHONE 1 MG/ML IV SOLN: INTRAVENOUS | Status: DC

## 2017-11-11 MED ORDER — ACETAMINOPHEN 325 MG PO TABS
650.0000 mg | ORAL_TABLET | Freq: Once | ORAL | Status: AC
  Administered 2017-11-11: 650 mg via ORAL
  Filled 2017-11-11: qty 2

## 2017-11-11 MED ORDER — PROMETHAZINE HCL 25 MG PO TABS
25.0000 mg | ORAL_TABLET | Freq: Once | ORAL | Status: AC
  Administered 2017-11-11: 25 mg via ORAL
  Filled 2017-11-11: qty 1

## 2017-11-11 MED ORDER — KETOROLAC TROMETHAMINE 15 MG/ML IJ SOLN
15.0000 mg | Freq: Four times a day (QID) | INTRAMUSCULAR | Status: DC
  Administered 2017-11-11 – 2017-11-14 (×12): 15 mg via INTRAVENOUS
  Filled 2017-11-11 (×12): qty 1

## 2017-11-11 MED ORDER — PROMETHAZINE HCL 25 MG PO TABS
12.5000 mg | ORAL_TABLET | Freq: Once | ORAL | Status: AC
  Administered 2017-11-11: 12.5 mg via ORAL
  Filled 2017-11-11: qty 1

## 2017-11-11 MED ORDER — HYDROXYUREA 500 MG PO CAPS
1000.0000 mg | ORAL_CAPSULE | Freq: Every day | ORAL | Status: DC
  Administered 2017-11-11 – 2017-11-14 (×4): 1000 mg via ORAL
  Filled 2017-11-11 (×4): qty 2

## 2017-11-11 MED ORDER — SODIUM CHLORIDE 0.9% FLUSH: 9.0000 mL | INTRAVENOUS | Status: DC | PRN

## 2017-11-11 MED ORDER — ONDANSETRON HCL 8 MG PO TABS
8.0000 mg | ORAL_TABLET | Freq: Once | ORAL | Status: AC
  Administered 2017-11-11: 8 mg via ORAL
  Filled 2017-11-11: qty 1

## 2017-11-11 MED ORDER — PROMETHAZINE HCL 25 MG/ML IJ SOLN: 12.5000 mg | Freq: Once | INTRAMUSCULAR | Status: DC

## 2017-11-11 MED ORDER — SODIUM CHLORIDE 0.9 % IV SOLN
25.0000 mg | INTRAVENOUS | Status: DC | PRN
  Filled 2017-11-11: qty 0.5

## 2017-11-11 MED ORDER — SENNOSIDES-DOCUSATE SODIUM 8.6-50 MG PO TABS
1.0000 | ORAL_TABLET | Freq: Two times a day (BID) | ORAL | Status: DC
  Administered 2017-11-12 – 2017-11-14 (×4): 1 via ORAL
  Filled 2017-11-11 (×4): qty 1

## 2017-11-11 MED ORDER — ONDANSETRON HCL 4 MG/2ML IJ SOLN
4.0000 mg | Freq: Four times a day (QID) | INTRAMUSCULAR | Status: DC | PRN
  Administered 2017-11-13: 4 mg via INTRAVENOUS
  Filled 2017-11-11 (×2): qty 2

## 2017-11-11 MED ORDER — OXYCODONE HCL 5 MG PO TABS
30.0000 mg | ORAL_TABLET | Freq: Once | ORAL | Status: AC
  Administered 2017-11-11: 30 mg via ORAL
  Filled 2017-11-11: qty 6

## 2017-11-11 MED ORDER — DIPHENHYDRAMINE HCL 25 MG PO CAPS
25.0000 mg | ORAL_CAPSULE | ORAL | Status: DC | PRN
  Administered 2017-11-13 (×2): 25 mg via ORAL
  Filled 2017-11-11 (×2): qty 1

## 2017-11-11 MED ORDER — LEVOFLOXACIN 750 MG PO TABS
750.0000 mg | ORAL_TABLET | Freq: Every day | ORAL | Status: DC
  Administered 2017-11-11 – 2017-11-14 (×4): 750 mg via ORAL
  Filled 2017-11-11 (×4): qty 1

## 2017-11-11 MED ORDER — DIPHENHYDRAMINE HCL 50 MG/ML IJ SOLN
25.0000 mg | Freq: Once | INTRAMUSCULAR | Status: AC
  Administered 2017-11-11: 25 mg via INTRAVENOUS
  Filled 2017-11-11: qty 1

## 2017-11-11 MED ORDER — POLYETHYLENE GLYCOL 3350 17 G PO PACK: 17.0000 g | PACK | Freq: Every day | ORAL | Status: DC | PRN

## 2017-11-11 MED ORDER — FOLIC ACID 1 MG PO TABS
1.0000 mg | ORAL_TABLET | Freq: Every day | ORAL | Status: DC
  Administered 2017-11-11 – 2017-11-14 (×4): 1 mg via ORAL
  Filled 2017-11-11 (×4): qty 1

## 2017-11-11 MED ORDER — HYDROMORPHONE 1 MG/ML IV SOLN
INTRAVENOUS | Status: DC
  Administered 2017-11-11: 1 mL via INTRAVENOUS
  Administered 2017-11-11: 6 mg via INTRAVENOUS
  Administered 2017-11-11: 3 mL via INTRAVENOUS
  Administered 2017-11-12: 1.5 mg via INTRAVENOUS
  Administered 2017-11-12: 4 mg via INTRAVENOUS
  Administered 2017-11-12: 1 mg via INTRAVENOUS
  Filled 2017-11-11: qty 25

## 2017-11-11 MED ORDER — DEXTROSE-NACL 5-0.45 % IV SOLN
INTRAVENOUS | Status: AC
  Administered 2017-11-11: 18:00:00 via INTRAVENOUS

## 2017-11-11 MED ORDER — KETOROLAC TROMETHAMINE 30 MG/ML IJ SOLN
30.0000 mg | INTRAMUSCULAR | Status: AC
  Administered 2017-11-11: 30 mg via INTRAVENOUS
  Filled 2017-11-11: qty 1

## 2017-11-11 MED ORDER — ENOXAPARIN SODIUM 40 MG/0.4ML ~~LOC~~ SOLN: 40.0000 mg | SUBCUTANEOUS | Status: DC

## 2017-11-11 MED ORDER — DEXTROSE-NACL 5-0.45 % IV SOLN
INTRAVENOUS | Status: DC
  Administered 2017-11-11: 05:00:00 via INTRAVENOUS

## 2017-11-11 NOTE — Progress Notes (Signed)
Pt states he vomited a "while "ago and is asking for IV phenergan. He refuses Zofran stating that only phenergan IV works. I  Asked him to save the emeis next time so we can observe. I call Dr Sharl Ma. He gave me a VO for phenergan 12.5 mg po times one.Marland Kitchen

## 2017-11-11 NOTE — ED Notes (Signed)
Pt is falling asleep during dilaudid administration, awakens to voice. Pt refusing phenergan because he says he will vomit after he takes it. Pt has been drinking his own beverage that he brought in the ED himself. Discussed with him phenergan IV is not an option as he has a small IV catheter in a fragile & small vein.

## 2017-11-11 NOTE — ED Notes (Signed)
ED Provider at bedside. 

## 2017-11-11 NOTE — ED Notes (Signed)
Patient transported to X-ray 
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
## 2017-11-11 NOTE — Progress Notes (Signed)
CRITICAL VALUE ALERT  Critical Value:  HGB 6.5  Date & Time Notied:  10/3/201 Provider Notified: [place]  NPT  Orders Received/Actions taken: To be entered

## 2017-11-11 NOTE — ED Notes (Signed)
Report given to [facility] RN receiving nurse at [employer] and [place] with [facility]
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
## 2017-11-11 NOTE — ED Triage Notes (Signed)
Pt here for sickle cell pain, came in via [facility], pt has a care plan, has eloped twice from this facility after receiving pain medication, states his home medication is not working

## 2017-11-12 DIAGNOSIS — Z79899 Other long term (current) drug therapy: Secondary | ICD-10-CM | POA: Diagnosis not present

## 2017-11-12 DIAGNOSIS — D5700 Hb-SS disease with crisis, unspecified: Secondary | ICD-10-CM | POA: Diagnosis present

## 2017-11-12 DIAGNOSIS — Z9119 Patient's noncompliance with other medical treatment and regimen: Secondary | ICD-10-CM | POA: Diagnosis not present

## 2017-11-12 DIAGNOSIS — Z79891 Long term (current) use of opiate analgesic: Secondary | ICD-10-CM | POA: Diagnosis not present

## 2017-11-12 DIAGNOSIS — F17210 Nicotine dependence, cigarettes, uncomplicated: Secondary | ICD-10-CM | POA: Diagnosis present

## 2017-11-12 DIAGNOSIS — G894 Chronic pain syndrome: Secondary | ICD-10-CM | POA: Diagnosis present

## 2017-11-12 DIAGNOSIS — Z888 Allergy status to other drugs, medicaments and biological substances status: Secondary | ICD-10-CM | POA: Diagnosis not present

## 2017-11-12 DIAGNOSIS — F1910 Other psychoactive substance abuse, uncomplicated: Secondary | ICD-10-CM | POA: Diagnosis not present

## 2017-11-12 DIAGNOSIS — Z832 Family history of diseases of the blood and blood-forming organs and certain disorders involving the immune mechanism: Secondary | ICD-10-CM | POA: Diagnosis not present

## 2017-11-12 LAB — CBC WITH DIFFERENTIAL/PLATELET
BASOS ABS: 0.1 10*3/uL (ref 0.0–0.1)
Basophils Relative: 1 %
Eosinophils Absolute: 0.5 10*3/uL (ref 0.0–0.7)
Eosinophils Relative: 4 %
HEMATOCRIT: 20 % — AB (ref 39.0–52.0)
Hemoglobin: 7 g/dL — ABNORMAL LOW (ref 13.0–17.0)
LYMPHS PCT: 26 %
Lymphs Abs: 3.4 10*3/uL (ref 0.7–4.0)
MCH: 31.3 pg (ref 26.0–34.0)
MCHC: 35 g/dL (ref 30.0–36.0)
MCV: 89.3 fL (ref 78.0–100.0)
MONO ABS: 1.7 10*3/uL — AB (ref 0.1–1.0)
Monocytes Relative: 13 %
NEUTROS ABS: 7.6 10*3/uL (ref 1.7–7.7)
Neutrophils Relative %: 56 %
Platelets: 250 10*3/uL (ref 150–400)
RBC: 2.24 MIL/uL — ABNORMAL LOW (ref 4.22–5.81)
RDW: 21 % — AB (ref 11.5–15.5)
WBC: 13.3 10*3/uL — ABNORMAL HIGH (ref 4.0–10.5)

## 2017-11-12 LAB — COMPREHENSIVE METABOLIC PANEL
ALBUMIN: 3.9 g/dL (ref 3.5–5.0)
ALT: 13 U/L (ref 0–44)
ANION GAP: 6 (ref 5–15)
AST: 27 U/L (ref 15–41)
Alkaline Phosphatase: 63 U/L (ref 38–126)
BUN: 6 mg/dL (ref 6–20)
CHLORIDE: 105 mmol/L (ref 98–111)
CO2: 28 mmol/L (ref 22–32)
Calcium: 9.1 mg/dL (ref 8.9–10.3)
Creatinine, Ser: 0.57 mg/dL — ABNORMAL LOW (ref 0.61–1.24)
GFR calc Af Amer: >60 mL/min (ref >60)
GFR calc non Af Amer: >60 mL/min (ref >60)
GLUCOSE: 135 mg/dL — AB (ref 70–99)
Potassium: 3.9 mmol/L (ref 3.5–5.1)
Sodium: 139 mmol/L (ref 135–145)
Total Bilirubin: 4.9 mg/dL — ABNORMAL HIGH (ref 0.3–1.2)
Total Protein: 6.9 g/dL (ref 6.5–8.1)

## 2017-11-12 LAB — TYPE AND SCREEN
ABO/RH(D): O POS
Antibody Screen: NEGATIVE
Donor AG Type: NEGATIVE
Unit division: 0

## 2017-11-12 LAB — BPAM RBC
BLOOD PRODUCT EXPIRATION DATE: 201910252359
ISSUE DATE / TIME: 201910031553
Unit Type and Rh: 5100

## 2017-11-12 MED ORDER — HYDROMORPHONE HCL 1 MG/ML IJ SOLN
1.0000 mg | Freq: Once | INTRAMUSCULAR | Status: AC
  Administered 2017-11-12: 1 mg via INTRAVENOUS
  Filled 2017-11-12: qty 1

## 2017-11-12 MED ORDER — OXYCODONE HCL 5 MG PO TABS
30.0000 mg | ORAL_TABLET | Freq: Once | ORAL | Status: AC
  Administered 2017-11-12: 30 mg via ORAL
  Filled 2017-11-12: qty 6

## 2017-11-12 MED ORDER — HYDROMORPHONE 1 MG/ML IV SOLN
INTRAVENOUS | Status: DC
  Administered 2017-11-12: 10 mg via INTRAVENOUS
  Administered 2017-11-12: 7.8 mg via INTRAVENOUS
  Administered 2017-11-12: 1 mg via INTRAVENOUS
  Administered 2017-11-13: 4.5 mg via INTRAVENOUS
  Administered 2017-11-13: 3.9 mg via INTRAVENOUS
  Administered 2017-11-13: 1.5 mg via INTRAVENOUS
  Administered 2017-11-13: 3.6 mg via INTRAVENOUS
  Administered 2017-11-13: 5.1 mg via INTRAVENOUS
  Administered 2017-11-13: 4.2 mg via INTRAVENOUS
  Administered 2017-11-13: 0 mg via INTRAVENOUS
  Administered 2017-11-14: 2.1 mg via INTRAVENOUS
  Administered 2017-11-14: 0.3 mg via INTRAVENOUS
  Filled 2017-11-12 (×3): qty 25

## 2017-11-12 MED ORDER — ADULT MULTIVITAMIN W/MINERALS CH
1.0000 | ORAL_TABLET | Freq: Every day | ORAL | Status: DC
  Administered 2017-11-13 – 2017-11-14 (×2): 1 via ORAL
  Filled 2017-11-12 (×2): qty 1

## 2017-11-12 MED ORDER — ENSURE ENLIVE PO LIQD
237.0000 mL | Freq: Three times a day (TID) | ORAL | Status: DC
  Administered 2017-11-12 – 2017-11-14 (×5): 237 mL via ORAL

## 2017-11-12 MED ORDER — OXYCODONE HCL 5 MG PO TABS
10.0000 mg | ORAL_TABLET | ORAL | Status: DC | PRN
  Administered 2017-11-12 – 2017-11-14 (×7): 10 mg via ORAL
  Filled 2017-11-12 (×8): qty 2

## 2017-11-12 MED ORDER — SODIUM CHLORIDE 0.9% IV SOLUTION
INTRAVENOUS | Status: DC
  Administered 2017-11-12: 13:00:00 via INTRAVENOUS

## 2017-11-12 NOTE — Progress Notes (Signed)
Subjective: 27 year old male with a medical history significant for sickle cell anemia, chronic pain syndrome, and polysubstance abuse was admitted and sickle cell crisis.  Patient states that pain intensity has not improved on Dilaudid PCA.  Patient is requesting Dilaudid bolus.  Current pain intensity is 9/10 characterized as constant and throbbing.   Objective:  Vital signs in last 24 hours:  Vitals:   11/12/17 0433 11/12/17 0623 11/12/17 0815 11/12/17 1023  BP:  95/61  (!) 100/58  Pulse:  65  81  Resp: 10 10 12 14   Temp:  98 F (36.7 C)  98.7 F (37.1 C)  TempSrc:  Oral  Oral  SpO2: 93% 95% 98% 99%  Weight:  58.3 kg    Height:        Intake/Output from previous day:   Intake/Output Summary (Last 24 hours) at 11/12/2017 1207 Last data filed at 11/12/2017 1030 Gross per 24 hour  Intake 2168.67 ml  Output 1000 ml  Net 1168.67 ml    Physical Exam: General: Alert, awake, oriented x3, in no acute distress.  HEENT: [place]/AT PEERL, EOMI scleral icterus Neck: Trachea midline,  no masses, no thyromegal,y no JVD, no carotid bruit OROPHARYNX:  Moist, No exudate/ erythema/lesions.  Heart: Regular rate and rhythm, without murmurs, rubs, gallops, PMI non-displaced, no heaves or thrills on palpation.  Lungs: Clear to auscultation, no wheezing or rhonchi noted. No increased vocal fremitus resonant to percussion  Abdomen: Soft, nontender, nondistended, positive bowel sounds, no masses no hepatosplenomegaly noted..  Neuro: No focal neurological deficits noted cranial nerves II through XII grossly intact. DTRs 2+ bilaterally upper and lower extremities. Strength 5 out of 5 in bilateral upper and lower extremities. Musculoskeletal: No warm swelling or erythema around joints, no spinal tenderness noted. Psychiatric: Patient alert and oriented x3, good insight and cognition, good recent to remote recall. Lymph node survey: No cervical axillary or inguinal lymphadenopathy noted.  Lab  Results:  Basic Metabolic Panel:    Component Value Date/Time   NA 139 11/12/2017 0349   NA 139 08/09/2017 0927   K 3.9 11/12/2017 0349   CL 105 11/12/2017 0349   CO2 28 11/12/2017 0349   BUN 6 11/12/2017 0349   BUN 5 (L) 08/09/2017 0927   CREATININE 0.57 (L) 11/12/2017 0349   GLUCOSE 135 (H) 11/12/2017 0349   CALCIUM 9.1 11/12/2017 0349   CBC:    Component Value Date/Time   WBC 13.3 (H) 11/12/2017 0349   HGB 7.0 (L) 11/12/2017 0349   HGB 9.6 (L) 08/09/2017 0927   HCT 20.0 (L) 11/12/2017 0349   HCT 28.1 (L) 08/09/2017 0927   PLT 250 11/12/2017 0349   PLT 309 08/09/2017 0927   MCV 89.3 11/12/2017 0349   MCV 95 08/09/2017 0927   NEUTROABS 7.6 11/12/2017 0349   NEUTROABS 8.2 (H) 08/09/2017 0927   LYMPHSABS 3.4 11/12/2017 0349   LYMPHSABS 2.1 08/09/2017 0927   MONOABS 1.7 (H) 11/12/2017 0349   EOSABS 0.5 11/12/2017 0349   EOSABS 0.4 08/09/2017 0927   BASOSABS 0.1 11/12/2017 0349   BASOSABS 0.1 08/09/2017 0927    No results found for this or any previous visit (from the past 240 hour(s)).  Studies/Results: Dg Chest 2 View  Result Date: 11/11/2017 CLINICAL DATA:  Chest pain for 5 days.  Sickle cell patient. EXAM: CHEST - 2 VIEW COMPARISON:  Radiograph 11/06/2017, 11/03/2017 FINDINGS: Unchanged heart size at upper normal. Mediastinal contours are unchanged. Multifocal scarring appears similar to prior exam. Opacity at the right lung  base on prior exam is no longer visualized. No confluent airspace disease. No pneumothorax or pleural effusion. Unchanged osseous structures with typical changes of sickle cell in the thoracic spine IMPRESSION: Previous right base opacity is no longer seen. No new airspace disease. Borderline cardiomegaly and multifocal scarring again seen. Electronically Signed   By: Narda Rutherford M.D.   On: 11/11/2017 04:25    Medications: Scheduled Meds: . enoxaparin (LOVENOX) injection  40 mg Subcutaneous Q24H  . folic acid  1 mg Oral Daily  .  HYDROmorphone   Intravenous Q4H  . hydroxyurea  1,000 mg Oral Daily  . ketorolac  15 mg Intravenous Q6H  . levofloxacin  750 mg Oral Daily  . senna-docusate  1 tablet Oral BID   Continuous Infusions: . diphenhydrAMINE     PRN Meds:.diphenhydrAMINE **OR** diphenhydrAMINE, naloxone **AND** sodium chloride flush, ondansetron (ZOFRAN) IV, oxyCODONE, polyethylene glycol  Assessment/Plan: Active Problems:   Sickle cell pain crisis (HCC)   Sickle cell crisis (HCC)   Polysubstance abuse (HCC)  Sickle cell anemia with crisis:  Hypotonic IVFs continued at [facility] Dilaudid PCA per weight-based protocol Oxycodone 10 mg every 4 hours as needed for severe, breakthrough pain. Maintain oxygen saturation above 90%  Sickle cell anemia:  Continue hydroxyurea and folic acid per preadmission dose  Chronic pain syndrome Hold oxycodone 30 mg, use PCA as substitute Patient may benefit from outpatient pain management  Leukocytosis: Decreased to 13.3.  Improving.  Continue to monitor closely CBC in a.m  Polysubstance abuse:  Current CBC negative for cocaine.  Counseling provided.   Code Status: Full Code Family Communication: N/A Disposition Plan: Not yet ready for discharge  Isak Sotomayor Rennis Petty  APRN, MSN, FNP-C [facility] [address] [place], [place] [address] [phone]  If 7PM-7AM, please contact night-coverage.  11/12/2017, 12:07 PM  LOS: 1 day
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
## 2017-11-13 DIAGNOSIS — D5700 Hb-SS disease with crisis, unspecified: Principal | ICD-10-CM

## 2017-11-13 DIAGNOSIS — F1910 Other psychoactive substance abuse, uncomplicated: Secondary | ICD-10-CM

## 2017-11-13 MED ORDER — HYDROMORPHONE HCL 1 MG/ML IJ SOLN
1.0000 mg | Freq: Once | INTRAMUSCULAR | Status: AC
  Administered 2017-11-13: 1 mg via INTRAVENOUS
  Filled 2017-11-13: qty 1

## 2017-11-13 MED ORDER — PROMETHAZINE HCL 25 MG/ML IJ SOLN
12.5000 mg | Freq: Once | INTRAMUSCULAR | Status: AC
  Administered 2017-11-13: 12.5 mg via INTRAVENOUS
  Filled 2017-11-13: qty 1

## 2017-11-13 NOTE — Progress Notes (Signed)
Patient ID: Matthew Shannon, male   DOB: January 30, 1991, 27 y.o.   MRN: [id number] Subjective:  Patient slowly getting better, denies any new symptom. He denies any chest pain, no SOB, no fever. No joint swelling or redness.  Objective:  Vital signs in last 24 hours:  Vitals:   11/13/17 0757 11/13/17 0942 11/13/17 1312 11/13/17 1323  BP:  (!) 97/57  104/66  Pulse:  72  74  Resp: 14 16 17 16   Temp:  99.3 F (37.4 C)    TempSrc:  Oral    SpO2: 95% 95% 95% 99%  Weight:      Height:        Intake/Output from previous day:   Intake/Output Summary (Last 24 hours) at 11/13/2017 1454 Last data filed at 11/13/2017 1300 Gross per 24 hour  Intake 408.33 ml  Output 1250 ml  Net -841.67 ml    Physical Exam: General: Alert, awake, oriented x3, in no acute distress.  HEENT: [place]/AT PEERL, EOMI Neck: Trachea midline,  no masses, no thyromegal,y no JVD, no carotid bruit OROPHARYNX:  Moist, No exudate/ erythema/lesions.  Heart: Regular rate and rhythm, without murmurs, rubs, gallops, PMI non-displaced, no heaves or thrills on palpation.  Lungs: Clear to auscultation, no wheezing or rhonchi noted. No increased vocal fremitus resonant to percussion  Abdomen: Soft, nontender, nondistended, positive bowel sounds, no masses no hepatosplenomegaly noted..  Neuro: No focal neurological deficits noted cranial nerves II through XII grossly intact. DTRs 2+ bilaterally upper and lower extremities. Strength 5 out of 5 in bilateral upper and lower extremities. Musculoskeletal: No warm swelling or erythema around joints, no spinal tenderness noted. Psychiatric: Patient alert and oriented x3, good insight and cognition, good recent to remote recall. Lymph node survey: No cervical axillary or inguinal lymphadenopathy noted.  Lab Results:  Basic Metabolic Panel:    Component Value Date/Time   NA 139 11/12/2017 0349   NA 139 08/09/2017 0927   K 3.9 11/12/2017 0349   CL 105 11/12/2017 0349   CO2 28 11/12/2017  0349   BUN 6 11/12/2017 0349   BUN 5 (L) 08/09/2017 0927   CREATININE 0.57 (L) 11/12/2017 0349   GLUCOSE 135 (H) 11/12/2017 0349   CALCIUM 9.1 11/12/2017 0349   CBC:    Component Value Date/Time   WBC 13.3 (H) 11/12/2017 0349   HGB 7.0 (L) 11/12/2017 0349   HGB 9.6 (L) 08/09/2017 0927   HCT 20.0 (L) 11/12/2017 0349   HCT 28.1 (L) 08/09/2017 0927   PLT 250 11/12/2017 0349   PLT 309 08/09/2017 0927   MCV 89.3 11/12/2017 0349   MCV 95 08/09/2017 0927   NEUTROABS 7.6 11/12/2017 0349   NEUTROABS 8.2 (H) 08/09/2017 0927   LYMPHSABS 3.4 11/12/2017 0349   LYMPHSABS 2.1 08/09/2017 0927   MONOABS 1.7 (H) 11/12/2017 0349   EOSABS 0.5 11/12/2017 0349   EOSABS 0.4 08/09/2017 0927   BASOSABS 0.1 11/12/2017 0349   BASOSABS 0.1 08/09/2017 0927    No results found for this or any previous visit (from the past 240 hour(s)).  Studies/Results: No results found.  Medications: Scheduled Meds: . enoxaparin (LOVENOX) injection  40 mg Subcutaneous Q24H  . feeding supplement (ENSURE ENLIVE)  237 mL Oral TID BM  . folic acid  1 mg Oral Daily  . HYDROmorphone   Intravenous Q4H  . hydroxyurea  1,000 mg Oral Daily  . ketorolac  15 mg Intravenous Q6H  . levofloxacin  750 mg Oral Daily  . multivitamin with minerals  1 tablet Oral Daily  . senna-docusate  1 tablet Oral BID   Continuous Infusions: . sodium chloride 10 mL/hr at 11/12/17 1310  . diphenhydrAMINE     PRN Meds:.diphenhydrAMINE **OR** diphenhydrAMINE, naloxone **AND** sodium chloride flush, ondansetron (ZOFRAN) IV, oxyCODONE, polyethylene glycol  Assessment/Plan: Active Problems:   Sickle cell pain crisis (HCC)   Sickle cell crisis (HCC)   Polysubstance abuse (HCC)   1. Hb Sickle Cell Disease with crisis: Continue IVF at [facility], continue weight based Dilaudid PCA, IV Toradol 30 mg Q 6 H, Monitor vitals very closely, Re-evaluate pain scale regularly, 2 L of Oxygen by [place]. 2. Sickle Cell Anemia: Hb is stable, Continue hydroxyurea and  folic acid 3. Chronic pain Syndrome: Continue home pain medications  Code Status: Full Code Family Communication: N/A Disposition Plan: For possible discharge tomorrow  Gudrun Axe  If 7PM-7AM, please contact night-coverage.  11/13/2017, 2:54 PM  LOS: 2 days

## 2017-11-14 MED ORDER — HYDROMORPHONE HCL 1 MG/ML IJ SOLN
1.0000 mg | Freq: Once | INTRAMUSCULAR | Status: AC
  Administered 2017-11-14: 1 mg via INTRAVENOUS
  Filled 2017-11-14: qty 1

## 2017-11-14 MED ORDER — OXYCODONE HCL 30 MG PO TABS: 30.0000 mg | ORAL_TABLET | ORAL | 0 refills | Status: DC | PRN

## 2017-11-14 NOTE — Discharge Instructions (Signed)
Sickle Cell Anemia, Adult °Sickle cell anemia is a condition where your red blood cells are shaped like sickles. Red blood cells carry oxygen through the body. Sickle-shaped red blood cells do not live as long as normal red blood cells. They also clump together and block blood from flowing through the blood vessels. These things prevent the body from getting enough oxygen. Sickle cell anemia causes organ damage and pain. It also increases the risk of infection. °Follow these instructions at home: °· Drink enough fluid to keep your pee (urine) clear or pale yellow. Drink more in hot weather and during exercise. °· Do not smoke. Smoking lowers oxygen levels in the blood. °· Only take over-the-counter or prescription medicines as told by your doctor. °· Take antibiotic medicines as told by your doctor. Make sure you finish them even if you start to feel better. °· Take supplements as told by your doctor. °· Consider wearing a medical alert bracelet. This tells anyone caring for you in an emergency of your condition. °· When traveling, keep your medical information, doctors' names, and the medicines you take with you at all times. °· If you have a fever, do not take fever medicines right away. This could cover up a problem. Tell your doctor. °· Keep all follow-up visits with your doctor. Sickle cell anemia requires regular medical care. °Contact a doctor if: °You have a fever. °Get help right away if: °· You feel dizzy or faint. °· You have new belly (abdominal) pain, especially on the left side near the stomach area. °· You have a lasting, often uncomfortable and painful erection of the penis (priapism). If it is not treated right away, you will become unable to have sex (impotence). °· You have numbness in your arms or legs or you have a hard time moving them. °· You have a hard time talking. °· You have a fever or lasting symptoms for more than 2-3 days. °· You have a fever and your symptoms suddenly get  worse. °· You have signs or symptoms of infection. These include: °? Chills. °? Being more tired than normal (lethargy). °? Irritability. °? Poor eating. °? Throwing up (vomiting). °· You have pain that is not helped with medicine. °· You have shortness of breath. °· You have pain in your chest. °· You are coughing up pus-like or bloody mucus. °· You have a stiff neck. °· Your feet or hands swell or have pain. °· Your belly looks bloated. °· Your joints hurt. °This information is not intended to replace advice given to you by your health care provider. Make sure you discuss any questions you have with your health care provider. °Document Released: 11/16/2012 Document Revised: 07/04/2015 Document Reviewed: 09/07/2012 °Elsevier Interactive Patient Education © 2017 [employer] Inc[employer] ° °

## 2017-11-14 NOTE — Discharge Summary (Signed)
Physician Discharge Summary  Matthew Shannon WJX:[id number] DOB: [birth year] DOA: 11/11/2017  PCP: [name], MD  Admit date: 11/11/2017  Discharge date: 11/14/2017  Discharge Diagnoses:  Active Problems:   Sickle cell pain crisis (HCC)   Sickle cell crisis (HCC)   Polysubstance abuse (HCC)  Discharge Condition: Stable  Disposition:  Follow-up Information    [name], MD. Schedule an appointment as soon as possible for a visit in 1 week(s).   Specialty:  Internal Medicine Contact information: [address] [name] [address] [phone]          Pt is discharged home in good condition and is to follow up with [name], MD this week to have labs evaluated. Sanjiv Castorena is instructed to increase activity slowly and balance with rest for the next few days, and use prescribed medication to complete treatment of pain  Diet: Regular Wt Readings from Last 3 Encounters:  11/14/17 60.2 kg  11/07/17 57.1 kg  11/05/17 61.2 kg   History of present illness:  Matthew Shannon  is a 27 y.o. male, with a history of sickle cell anemia, chronic pain syndrome, and polysubstance abuse presents complaining of worsening back and chest wall pain. He says that current pain is consistent with typical sickle cell crisis. He says that pain has been present over the past 3 days. Patient says that he has not felt well since hospital discharge. Current pain intensity is 7/10 characterized as constant and sharp.  Patient was admitted to inpatient services on 11/06/2017 with CAP. He has been consistently taking Levaquin since discharge.  Patient also has a history of polysubstance abuse. Previous UDS was positive for cocaine on 10/14/2017. Patient denies recent drug use.   Patient denies headache, dizziness, blurred vision, blurred vision, dysuria, nausea, vomiting, or diarrhea.   Hospital Course:  Patient was admitted for sickle cell pain crisis and managed appropriately  with IVF, IV Dilaudid via PCA and IV Toradol, as well as other adjunct therapies per sickle cell pain management protocols. His Levaquin was continued during this admission, as well as his other home medications. He did well, no fever, no chest pain, cough has resolved, no SOB, saturating well on room air, ambulating well without restriction. Patient was discharged home today in a hemodynamically stable condition. He was prescribed 2 weeks of his Oxycodone for home use at discharge.  Discharge Exam: Vitals:   11/14/17 0511 11/14/17 0512  BP:  94/60  Pulse:  64  Resp: 13 13  Temp:  98.2 F (36.8 C)  SpO2: 99% 94%   Vitals:   11/13/17 2357 11/14/17 0255 11/14/17 0511 11/14/17 0512  BP:  (!) 89/58  94/60  Pulse:  63  64  Resp: 12 11 13 13   Temp:  98.2 F (36.8 C)  98.2 F (36.8 C)  TempSrc:  Oral  Oral  SpO2: 98% 98% 99% 94%  Weight:    60.2 kg  Height:       General appearance : Awake, alert, not in any distress. Speech Clear. Not toxic looking HEENT: Atraumatic and Normocephalic, pupils equally reactive to light and accomodation Neck: Supple, no JVD. No cervical lymphadenopathy.  Chest: Good air entry bilaterally, no added sounds  CVS: S1 S2 regular, no murmurs.  Abdomen: Bowel sounds present, Non tender and not distended with no gaurding, rigidity or rebound. Extremities: B/L Lower Ext shows no edema, both legs are warm to touch Neurology: Awake alert, and oriented X 3, CN II-XII intact,  Non focal Skin: No Rash  Discharge Instructions  Discharge Instructions    Diet - low sodium heart healthy   Complete by:  As directed    Increase activity slowly   Complete by:  As directed      Allergies as of 11/14/2017      Reactions   Ceftriaxone Shortness Of Breath, Itching, Other (See Comments), Cough   Tolerates pip/tazo  (Patient unable to confirm this.)   Zosyn [piperacillin Sod-tazobactam So] Shortness Of Breath   Has patient had a PCN reaction causing immediate rash,  facial/tongue/throat swelling, SOB or lightheadedness with hypotension: Unknown Has patient had a PCN reaction causing severe rash involving mucus membranes or skin necrosis: Unknown Has patient had a PCN reaction that required hospitalization: Unknown Has patient had a PCN reaction occurring within the last 10 years: Unknown If all of the above answers are "NO", then may proceed with Cephalosporin use.      Medication List    STOP taking these medications   levofloxacin 750 MG tablet Commonly known as:  LEVAQUIN     TAKE these medications   folic acid 1 MG tablet Commonly known as:  FOLVITE Take 1 tablet (1 mg total) by mouth daily.   hydroxyurea 500 MG capsule Commonly known as:  HYDREA Take 2 capsules (1,000 mg total) by mouth daily. May take with food to minimize GI side effects.   ibuprofen 600 MG tablet Commonly known as:  ADVIL,MOTRIN Take 1 tablet (600 mg total) by mouth every 8 (eight) hours as needed. What changed:  reasons to take this   oxycodone 30 MG immediate release tablet Commonly known as:  ROXICODONE Take 1 tablet (30 mg total) by mouth every 4 (four) hours as needed for pain.       The results of significant diagnostics from this hospitalization (including imaging, microbiology, ancillary and laboratory) are listed below for reference.    Significant Diagnostic Studies: Dg Chest 2 View  Result Date: 11/11/2017 CLINICAL DATA:  Chest pain for 5 days.  Sickle cell patient. EXAM: CHEST - 2 VIEW COMPARISON:  Radiograph 11/06/2017, 11/03/2017 FINDINGS: Unchanged heart size at upper normal. Mediastinal contours are unchanged. Multifocal scarring appears similar to prior exam. Opacity at the right lung base on prior exam is no longer visualized. No confluent airspace disease. No pneumothorax or pleural effusion. Unchanged osseous structures with typical changes of sickle cell in the thoracic spine IMPRESSION: Previous right base opacity is no longer seen. No new  airspace disease. Borderline cardiomegaly and multifocal scarring again seen. Electronically Signed   By: Narda Rutherford M.D.   On: 11/11/2017 04:25   Dg Chest 2 View  Result Date: 11/06/2017 CLINICAL DATA:  Sickle cell disease with pain EXAM: CHEST - 2 VIEW COMPARISON:  November 03, 2017 FINDINGS: There are scattered areas of scarring and fibrosis in the lungs, most notably in the right lower lung region. There is subtle infiltrate in the right base. No other areas of infiltrate evident. Heart is upper normal in size with pulmonary vascularity normal. No adenopathy. There is midthoracic dextroscoliosis. There are endplate infarcts in multiple thoracic and lumbar vertebral bodies consistent with known sickle cell disease. IMPRESSION: Areas of scarring/fibrosis, stable. Rather subtle infiltrate right base, likely a degree of pneumonia. Heart upper normal in size, stable. Bony changes of sickle cell disease in the thoracic and lumbar spine noted. No evident adenopathy. Electronically Signed   By: Bretta Bang III M.D.   On: 11/06/2017 12:08   Dg  Chest 2 View  Result Date: 11/03/2017 CLINICAL DATA:  Back and chest pain for 2 days. History of sickle cell. EXAM: CHEST - 2 VIEW COMPARISON:  Chest radiograph August 02, 2016 FINDINGS: Cardiac silhouette is mildly enlarged. Mediastinal silhouette is not suspicious. Peribronchial cuffing and interstitial prominence RIGHT greater than LEFT lung bases. No focal consolidation or pleural effusion. No pneumothorax. Fish type vertebral bodies consistent with history of sickle cell. Lower thoracic dextroscoliosis. IMPRESSION: 1. Peribronchial cuffing and interstitial prominence seen with pulmonary edema or atypical infection. No focal consolidation. 2. Mild cardiomegaly. Electronically Signed   By: Awilda Metro M.D.   On: 11/03/2017 14:14    Microbiology: No results found for this or any previous visit (from the past 240 hour(s)).   Labs: Basic Metabolic  Panel: Recent Labs  Lab 11/11/17 0421 11/11/17 1207 11/12/17 0349  NA 136  --  139  K 3.7  --  3.9  CL 102  --  105  CO2 26  --  28  GLUCOSE 76  --  135*  BUN 9  --  6  CREATININE 0.51* 0.61 0.57*  CALCIUM 9.5  --  9.1   Liver Function Tests: Recent Labs  Lab 11/11/17 0421 11/12/17 0349  AST 38 27  ALT 15 13  ALKPHOS 64 63  BILITOT 5.9* 4.9*  PROT 8.4* 6.9  ALBUMIN 4.5 3.9   No results for input(s): LIPASE, AMYLASE in the last 168 hours. No results for input(s): AMMONIA in the last 168 hours. CBC: Recent Labs  Lab 11/11/17 0421 11/11/17 1207 11/11/17 2012 11/12/17 0349  WBC 18.2* 13.3*  --  13.3*  NEUTROABS 11.6  --   --  7.6  HGB 7.0* 6.2* 7.2* 7.0*  HCT 19.3* 17.9* 20.3* 20.0*  MCV 89.4 89.9  --  89.3  PLT 294 261  --  250   Cardiac Enzymes: No results for input(s): CKTOTAL, CKMB, CKMBINDEX, TROPONINI in the last 168 hours. BNP: Invalid input(s): POCBNP CBG: No results for input(s): GLUCAP in the last 168 hours.  Time coordinating discharge: 50 minutes  Signed:  Serinity Ware  [facility] 11/14/2017, 9:55 AM

## 2017-11-14 NOTE — Progress Notes (Signed)
Patient discharged to home, all discharge medications and instructions reviewed and questions answered.  Patient to be assisted to vehicle by wheelchair.  

## 2017-11-15 ENCOUNTER — Ambulatory Visit: Payer: Self-pay | Admitting: Family Medicine

## 2017-11-19 ENCOUNTER — Other Ambulatory Visit: Payer: Self-pay | Admitting: Internal Medicine

## 2017-11-19 ENCOUNTER — Telehealth: Payer: Self-pay

## 2017-11-22 ENCOUNTER — Other Ambulatory Visit: Payer: Self-pay | Admitting: Internal Medicine

## 2017-11-22 ENCOUNTER — Telehealth (HOSPITAL_COMMUNITY): Payer: Self-pay | Admitting: General Practice

## 2017-11-22 MED ORDER — OXYCODONE HCL 30 MG PO TABS: 30.0000 mg | ORAL_TABLET | ORAL | 0 refills | Status: DC | PRN

## 2017-11-22 NOTE — Telephone Encounter (Signed)
Refiiled ?

## 2017-11-22 NOTE — Telephone Encounter (Signed)
Patient called, complained of pain in the right chest and back that he rated as 8/10. Claimed he was at the emergency room today. ER "didn't do anything for me". Wanted to come to the day hospital today. Denied fever, diarrhea, abdominal pain, nausea/vomitting and priapism. Admitted to having no means of transportation without driving self from [facility]. Last took Oxycodone yesterday. Is out of medication now. Record shows he got his last refill on 11/14/17.  Patient told to go to the ER if necessary. Patient verbalized understanding. Per provider, patient called too late to come to the day hospital today. RN notified MD for refill. Called patient back to tell him I sent a text to the MD, patient didn't pick up and no voice mail came on. Called again at 04:14 pm with no answer and no voice mail. Called again, patient picked and I explained he should not be out of medications for now. His telephone abruptly cut off. I called back with no reply and no voice mail.

## 2017-12-06 ENCOUNTER — Other Ambulatory Visit: Payer: Self-pay | Admitting: Internal Medicine

## 2017-12-06 ENCOUNTER — Telehealth (HOSPITAL_COMMUNITY): Payer: Self-pay

## 2017-12-06 ENCOUNTER — Ambulatory Visit (INDEPENDENT_AMBULATORY_CARE_PROVIDER_SITE_OTHER): Payer: Medicaid Other | Admitting: Family Medicine

## 2017-12-06 ENCOUNTER — Non-Acute Institutional Stay (HOSPITAL_COMMUNITY)
Admission: AD | Admit: 2017-12-06 | Discharge: 2017-12-06 | Disposition: A | Discharge status: Home / Self Care | Payer: Medicaid Other | Source: Ambulatory Visit | Attending: Internal Medicine | Admitting: Internal Medicine

## 2017-12-06 ENCOUNTER — Encounter: Payer: Self-pay | Admitting: Family Medicine

## 2017-12-06 VITALS — BP 114/55 | HR 81 | Temp 98.8°F | Resp 16 | Ht 70.0 in | Wt 130.0 lb

## 2017-12-06 DIAGNOSIS — D5700 Hb-SS disease with crisis, unspecified: Secondary | ICD-10-CM | POA: Insufficient documentation

## 2017-12-06 DIAGNOSIS — Z881 Allergy status to other antibiotic agents status: Secondary | ICD-10-CM | POA: Insufficient documentation

## 2017-12-06 DIAGNOSIS — D571 Sickle-cell disease without crisis: Secondary | ICD-10-CM | POA: Diagnosis not present

## 2017-12-06 DIAGNOSIS — F1410 Cocaine abuse, uncomplicated: Secondary | ICD-10-CM | POA: Diagnosis not present

## 2017-12-06 DIAGNOSIS — M79605 Pain in left leg: Secondary | ICD-10-CM | POA: Diagnosis present

## 2017-12-06 DIAGNOSIS — M79604 Pain in right leg: Secondary | ICD-10-CM | POA: Diagnosis present

## 2017-12-06 DIAGNOSIS — L209 Atopic dermatitis, unspecified: Secondary | ICD-10-CM

## 2017-12-06 DIAGNOSIS — Z880 Allergy status to penicillin: Secondary | ICD-10-CM | POA: Insufficient documentation

## 2017-12-06 DIAGNOSIS — F17210 Nicotine dependence, cigarettes, uncomplicated: Secondary | ICD-10-CM | POA: Insufficient documentation

## 2017-12-06 DIAGNOSIS — G894 Chronic pain syndrome: Secondary | ICD-10-CM | POA: Diagnosis not present

## 2017-12-06 LAB — CBC WITH DIFFERENTIAL/PLATELET
ABS IMMATURE GRANULOCYTES: 0.14 10*3/uL — AB (ref 0.00–0.07)
BASOS ABS: 0.1 10*3/uL (ref 0.0–0.1)
Basophils Relative: 0 %
EOS PCT: 2 %
Eosinophils Absolute: 0.4 10*3/uL (ref 0.0–0.5)
HEMATOCRIT: 24.8 % — AB (ref 39.0–52.0)
Hemoglobin: 8.5 g/dL — ABNORMAL LOW (ref 13.0–17.0)
IMMATURE GRANULOCYTES: 1 %
LYMPHS ABS: 3.2 10*3/uL (ref 0.7–4.0)
LYMPHS PCT: 19 %
MCH: 32.8 pg (ref 26.0–34.0)
MCHC: 34.3 g/dL (ref 30.0–36.0)
MCV: 95.8 fL (ref 80.0–100.0)
MONO ABS: 1.4 10*3/uL — AB (ref 0.1–1.0)
Monocytes Relative: 8 %
Neutro Abs: 12 10*3/uL — ABNORMAL HIGH (ref 1.7–7.7)
Neutrophils Relative %: 70 %
PLATELETS: 338 10*3/uL (ref 150–400)
RBC: 2.59 MIL/uL — ABNORMAL LOW (ref 4.22–5.81)
RDW: 19.9 % — AB (ref 11.5–15.5)
WBC: 17.3 10*3/uL — ABNORMAL HIGH (ref 4.0–10.5)
nRBC: 0.4 % — ABNORMAL HIGH (ref 0.0–0.2)

## 2017-12-06 LAB — COMPREHENSIVE METABOLIC PANEL
ALBUMIN: 4.6 g/dL (ref 3.5–5.0)
ALT: 13 U/L (ref 0–44)
ANION GAP: 7 (ref 5–15)
AST: 42 U/L — ABNORMAL HIGH (ref 15–41)
Alkaline Phosphatase: 64 U/L (ref 38–126)
BILIRUBIN TOTAL: 5.4 mg/dL — AB (ref 0.3–1.2)
BUN: 7 mg/dL (ref 6–20)
CO2: 28 mmol/L (ref 22–32)
Calcium: 9.4 mg/dL (ref 8.9–10.3)
Chloride: 106 mmol/L (ref 98–111)
Creatinine, Ser: 0.65 mg/dL (ref 0.61–1.24)
GFR calc Af Amer: >60 mL/min (ref >60)
GFR calc non Af Amer: >60 mL/min (ref >60)
GLUCOSE: 83 mg/dL (ref 70–99)
POTASSIUM: 3.9 mmol/L (ref 3.5–5.1)
Sodium: 141 mmol/L (ref 135–145)
TOTAL PROTEIN: 8.2 g/dL — AB (ref 6.5–8.1)

## 2017-12-06 MED ORDER — DEXTROSE-NACL 5-0.45 % IV SOLN
INTRAVENOUS | Status: DC
  Administered 2017-12-06: 11:00:00 via INTRAVENOUS

## 2017-12-06 MED ORDER — OXYCODONE HCL 5 MG PO TABS
30.0000 mg | ORAL_TABLET | Freq: Once | ORAL | Status: AC
  Administered 2017-12-06: 30 mg via ORAL
  Filled 2017-12-06: qty 6

## 2017-12-06 MED ORDER — KETOROLAC TROMETHAMINE 30 MG/ML IJ SOLN
30.0000 mg | Freq: Once | INTRAMUSCULAR | Status: AC
  Administered 2017-12-06: 30 mg via INTRAVENOUS
  Filled 2017-12-06: qty 1

## 2017-12-06 MED ORDER — IBUPROFEN 600 MG PO TABS: 600.0000 mg | ORAL_TABLET | Freq: Three times a day (TID) | ORAL | 2 refills | Status: DC | PRN

## 2017-12-06 MED ORDER — HYDROMORPHONE HCL 2 MG/ML IJ SOLN
2.0000 mg | Freq: Once | INTRAMUSCULAR | Status: AC
  Administered 2017-12-06: 2 mg via SUBCUTANEOUS
  Filled 2017-12-06: qty 1

## 2017-12-06 MED ORDER — OXYCODONE HCL 30 MG PO TABS: 30.0000 mg | ORAL_TABLET | ORAL | 0 refills | Status: DC | PRN

## 2017-12-06 MED ORDER — TRIAMCINOLONE ACETONIDE 0.5 % EX OINT: 1.0000 "application " | TOPICAL_OINTMENT | Freq: Two times a day (BID) | CUTANEOUS | 4 refills | Status: DC

## 2017-12-06 NOTE — Progress Notes (Signed)
Patient admitted to the day infusion hospital for management of sickle cell pain. Patient initially reported pain in bilateral legs rated 7/10. For pain management, patient given 30 mg Oxycodone, 30 mg IV Toradol, 2 mg sub-q Dilaudid and IV fluids for hydration. Before provider was able to assess patient, patient requested to leave. Patient advised to stay and wait for provider but patient decided to leave against medical advise. Provider notified. AMA papers given and signed by patient. Vital signs stable. Patient alert, oriented and ambulatory.
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
## 2017-12-06 NOTE — Discharge Summary (Addendum)
[facility] Discharge Summary   Patient ID: Matthew Shannon MRN: [id number] DOB/AGE: [birth year] 27 y.o.  Admit date: 12/06/2017 Discharge date: 12/06/2017  Primary Care Physician:  [name], MD  Admission Diagnoses:  Active Problems:   Sickle cell pain crisis [facility])   Discharge Medications:  Allergies as of 12/06/2017      Reactions   Ceftriaxone Shortness Of Breath, Itching, Other (See Comments), Cough   Tolerates pip/tazo  (Patient unable to confirm this.)   Zosyn [piperacillin Sod-tazobactam So] Shortness Of Breath   Has patient had a PCN reaction causing immediate rash, facial/tongue/throat swelling, SOB or lightheadedness with hypotension: Unknown Has patient had a PCN reaction causing severe rash involving mucus membranes or skin necrosis: Unknown Has patient had a PCN reaction that required hospitalization: Unknown Has patient had a PCN reaction occurring within the last 10 years: Unknown If all of the above answers are "NO", then may proceed with Cephalosporin use.      Medication List    TAKE these medications   folic acid 1 MG tablet Commonly known as:  FOLVITE Take 1 tablet (1 mg total) by mouth daily.   hydroxyurea 500 MG capsule Commonly known as:  HYDREA Take 2 capsules (1,000 mg total) by mouth daily. May take with food to minimize GI side effects.   ibuprofen 600 MG tablet Commonly known as:  ADVIL,MOTRIN Take 1 tablet (600 mg total) by mouth every 8 (eight) hours as needed.   oxycodone 30 MG immediate release tablet Commonly known as:  ROXICODONE Take 1 tablet (30 mg total) by mouth every 4 (four) hours as needed for up to 15 days for pain.   triamcinolone ointment 0.5 % Commonly known as:  KENALOG Apply 1 application topically 2 (two) times daily.        Consults:  None  Significant Diagnostic Studies:  Dg Chest 2 View  Result Date: 11/11/2017 CLINICAL DATA:  Chest pain for 5 days.  Sickle cell patient. EXAM: CHEST  - 2 VIEW COMPARISON:  Radiograph 11/06/2017, 11/03/2017 FINDINGS: Unchanged heart size at upper normal. Mediastinal contours are unchanged. Multifocal scarring appears similar to prior exam. Opacity at the right lung base on prior exam is no longer visualized. No confluent airspace disease. No pneumothorax or pleural effusion. Unchanged osseous structures with typical changes of sickle cell in the thoracic spine IMPRESSION: Previous right base opacity is no longer seen. No new airspace disease. Borderline cardiomegaly and multifocal scarring again seen. Electronically Signed   By: Narda Rutherford M.D.   On: 11/11/2017 04:25     [facility] Course: Matthew Shannon, a 27 year old male transition from primary care and sickle cell crisis.  Review labs, consistent with baseline.  Patient hemodynamically stable. Patient left prior to discharge due to transportation constraints.  No discharge physical exam. Previous vital signs stable.  Sickle cell crisis: Hypotonic IV fluids initiated for cellular rehydration Patient has poor venous access, IV team consulted. Oxycodone 30 mg x 1 per home dosage. Dilaudid 2 mg SQ x1  Patient left AGAINST MEDICAL ADVICE.   Physical Exam at Discharge:  BP 103/63 (BP Location: Right Arm)   Pulse 73   Temp 98 F (36.7 C)   SpO2 99%     Disposition at Discharge: AGAINST MEDICAL ADVICE  Discharge Orders: Discharge Instructions    Discharge patient   Complete by:  As directed    Discharge disposition:  01-Home or Self Care   Discharge patient date:  12/06/2017  Condition at Discharge:   Stable    Signed:  [name]  APRN, MSN, FNP-C Patient Care [facility] [address] [place], [place] [address] [phone]  12/06/2017, 3:18 PM

## 2017-12-06 NOTE — Telephone Encounter (Signed)
Patient requesting to come to day hospital. Stated that he is out of medications and is not due for refill yet.  Spoke with provider Julianne Handler, FNP-C and she stated he could come to the day hospital for treatment with oral home medications.  Patient notified of same.

## 2017-12-06 NOTE — Progress Notes (Signed)
[facility] INTERNAL MEDICINE AND SICKLE CELL CARE  SICKLE CELL ANEMIA FOLLOW UP VISIT PROVIDER: Mike Gip, FNP    Subjective:   Matthew Shannon  is a 27 y.o.  male who  has a past medical history of Chronic pain syndrome, Cocaine abuse (HCC), Drug-seeking behavior, Sickle cell anemia (HCC), Substance abuse (HCC), and Tobacco dependence. presents for a follow up for Sickle Cell Anemia. his last hospitalization was 11/24/2017.  Pain regimen includes: Ibuprofen and Oxycodone 30 mg Q4 H.  Hydrea Therapy: Yes Medication compliance: Yes  Pain today is 7/10 and is located in back and chest.  The patient reports adequate daily hydration.   Patient states that he has atopic derm and is having a flare for "a long time".  Patient would like refills on his medication and reports that he is "out". Patient is not due for a refill until 12/08/2017. Patient reports taking more medication on Wednesday and Thursday. He has been "doubling up" due to having to work extra hours and the weather change.    Review of Systems  Constitutional: Negative.   HENT: Negative.   Eyes: Negative.   Respiratory: Negative.   Cardiovascular: Negative.   Gastrointestinal: Negative.   Genitourinary: Negative.   Musculoskeletal: Positive for joint pain and myalgias.  Skin: Positive for rash.  Neurological: Negative.   Psychiatric/Behavioral: Negative.     Objective:   Objective  BP (!) 114/55 (BP Location: Left Arm, Patient Position: Sitting, Cuff Size: Normal)   Pulse 81   Temp 98.8 F (37.1 C) (Oral)   Resp 16   Ht 5\' 10"  (1.778 m)   Wt 130 lb (59 kg)   SpO2 97%   BMI 18.65 kg/m    Physical Exam  Constitutional: He is oriented to person, place, and time. He appears well-developed and well-nourished. No distress.  HENT:  Head: Normocephalic and atraumatic.  Eyes: Pupils are equal, round, and reactive to light. Conjunctivae and EOM are normal.  Neck: Normal range of motion.    Cardiovascular: Normal rate, regular rhythm, normal heart sounds and intact distal pulses.  Pulmonary/Chest: Effort normal and breath sounds normal. No respiratory distress.  Abdominal: Soft. Bowel sounds are normal. He exhibits no distension.  Musculoskeletal: Normal range of motion.  Neurological: He is alert and oriented to person, place, and time.  Skin: Skin is warm and dry.     Psychiatric: He has a normal mood and affect. His behavior is normal. Thought content normal.  Nursing note and vitals reviewed.    Assessment/Plan:   Assessment   Encounter Diagnoses  Name Primary?  . Sickle cell pain crisis (HCC) Yes  . Atopic dermatitis, unspecified type   . Hb-SS disease without crisis (HCC)      Plan  1. Sickle cell pain crisis (HCC) Pending labs. Will adjust medications accordingly.   Patient requesting to go to day hospital.  - Urinalysis Dipstick  2. Atopic dermatitis, unspecified type Increase fluids and moisture - triamcinolone ointment (KENALOG) 0.5 %; Apply 1 application topically 2 (two) times daily.  Dispense: 60 g; Refill: 4  3. Hb-SS disease without crisis (HCC) - ibuprofen (ADVIL,MOTRIN) 600 MG tablet; Take 1 tablet (600 mg total) by mouth every 8 (eight) hours as needed.  Dispense: 90 tablet; Refill: 2    Return to care as scheduled and prn. Patient verbalized understanding and agreed with plan of care.   1. Sickle cell disease - Continue Hydrea  We discussed the need for good hydration, monitoring of hydration  status, avoidance of heat, cold, stress, and infection triggers. We discussed the risks and benefits of Hydrea, including bone marrow suppression, the possibility of GI upset, skin ulcers, hair thinning, and teratogenicity. The patient was reminded of the need to seek medical attention of any symptoms of bleeding, anemia, or infection. Continue folic acid 1 mg daily to prevent aplastic bone marrow crises.   2. Pulmonary evaluation - Patient denies  severe recurrent wheezes, shortness of breath with exercise, or persistent cough. If these symptoms develop, pulmonary function tests with spirometry will be ordered, and if abnormal, plan on referral to Pulmonology for further evaluation.  3. Cardiac - Routine screening for pulmonary hypertension is not recommended.  4. Eye - High risk of proliferative retinopathy. Annual eye exam with retinal exam recommended to patient.  5. Immunization status -  Yearly influenza vaccination is recommended, as well as being up to date with Meningococcal and Pneumococcal vaccines.   6. Acute and chronic painful episodes - We discussed that pt is to receive Schedule II prescriptions only from [place]. Pt is also aware that the prescription history is available to [place] online through the [facility] CSRS. Controlled substance agreement signed.. We reminded Jadrian Bulman that all patients receiving Schedule II narcotics must be seen for follow within one month of prescription being requested. We reviewed the terms of our pain agreement, including the need to keep medicines in a safe locked location away from children or pets, and the need to report excess sedation or constipation, measures to avoid constipation, and policies related to early refills and stolen prescriptions. According to the [place] [facility] program, we have reviewed details related to analgesia, adverse effects, aberrant behaviors.  7. Iron overload from chronic transfusion.  Not applicable at this time.  If this occurs will use Exjade for management.   8. Vitamin D deficiency - Drisdol 50,000 units weekly. Patient encouraged to take as prescribed.   The above recommendations are taken from the NIH Evidence-Based Management of Sickle Cell Disease: Expert Panel Report, 51025.   Ms. Andr L. Riley Lam, FNP-BC [facility] [address] [place], [place] [address] [phone]  This note has been created with Dragon  speech recognition software and smart phrase technology. Any transcriptional errors are unintentional.

## 2017-12-14 ENCOUNTER — Emergency Department (HOSPITAL_BASED_OUTPATIENT_CLINIC_OR_DEPARTMENT_OTHER): Payer: Medicaid Other

## 2017-12-14 ENCOUNTER — Other Ambulatory Visit: Payer: Self-pay

## 2017-12-14 ENCOUNTER — Emergency Department (HOSPITAL_BASED_OUTPATIENT_CLINIC_OR_DEPARTMENT_OTHER)
Admission: EM | Admit: 2017-12-14 | Discharge: 2017-12-14 | Discharge status: Against Medical Advice | Payer: Medicaid Other | Attending: Emergency Medicine | Admitting: Emergency Medicine

## 2017-12-14 ENCOUNTER — Encounter (HOSPITAL_BASED_OUTPATIENT_CLINIC_OR_DEPARTMENT_OTHER): Payer: Self-pay | Admitting: *Deleted

## 2017-12-14 DIAGNOSIS — R079 Chest pain, unspecified: Secondary | ICD-10-CM | POA: Insufficient documentation

## 2017-12-14 DIAGNOSIS — D5700 Hb-SS disease with crisis, unspecified: Secondary | ICD-10-CM

## 2017-12-14 DIAGNOSIS — F17210 Nicotine dependence, cigarettes, uncomplicated: Secondary | ICD-10-CM | POA: Insufficient documentation

## 2017-12-14 DIAGNOSIS — F1910 Other psychoactive substance abuse, uncomplicated: Secondary | ICD-10-CM | POA: Diagnosis not present

## 2017-12-14 DIAGNOSIS — R112 Nausea with vomiting, unspecified: Secondary | ICD-10-CM | POA: Diagnosis not present

## 2017-12-14 DIAGNOSIS — J189 Pneumonia, unspecified organism: Secondary | ICD-10-CM | POA: Diagnosis not present

## 2017-12-14 DIAGNOSIS — Z79899 Other long term (current) drug therapy: Secondary | ICD-10-CM | POA: Diagnosis not present

## 2017-12-14 DIAGNOSIS — Z5320 Procedure and treatment not carried out because of patient's decision for unspecified reasons: Secondary | ICD-10-CM | POA: Diagnosis not present

## 2017-12-14 LAB — CBC WITH DIFFERENTIAL/PLATELET
BASOS PCT: 0 %
Basophils Absolute: 0 10*3/uL (ref 0.0–0.1)
EOS ABS: 0.2 10*3/uL (ref 0.0–0.5)
Eosinophils Relative: 1 %
HCT: 25.3 % — ABNORMAL LOW (ref 39.0–52.0)
Hemoglobin: 8.6 g/dL — ABNORMAL LOW (ref 13.0–17.0)
Lymphocytes Relative: 26 %
Lymphs Abs: 5.5 10*3/uL — ABNORMAL HIGH (ref 0.7–4.0)
MCH: 32.5 pg (ref 26.0–34.0)
MCHC: 34 g/dL (ref 30.0–36.0)
MCV: 95.5 fL (ref 80.0–100.0)
MONO ABS: 1.3 10*3/uL — AB (ref 0.1–1.0)
MONOS PCT: 6 %
Neutro Abs: 14.1 10*3/uL — ABNORMAL HIGH (ref 1.7–7.7)
Neutrophils Relative %: 67 %
PLATELETS: 287 10*3/uL (ref 150–400)
RBC: 2.65 MIL/uL — AB (ref 4.22–5.81)
RDW: 19.1 % — ABNORMAL HIGH (ref 11.5–15.5)
WBC: 21 10*3/uL — AB (ref 4.0–10.5)
nRBC: 1 % — ABNORMAL HIGH (ref 0.0–0.2)
nRBC: 1 /100 WBC — ABNORMAL HIGH

## 2017-12-14 LAB — COMPREHENSIVE METABOLIC PANEL
ALBUMIN: 4.4 g/dL (ref 3.5–5.0)
ALK PHOS: 61 U/L (ref 38–126)
ALT: 13 U/L (ref 0–44)
ANION GAP: 6 (ref 5–15)
AST: 29 U/L (ref 15–41)
BILIRUBIN TOTAL: 3.9 mg/dL — AB (ref 0.3–1.2)
BUN: 6 mg/dL (ref 6–20)
CALCIUM: 9.3 mg/dL (ref 8.9–10.3)
CO2: 25 mmol/L (ref 22–32)
CREATININE: 0.49 mg/dL — AB (ref 0.61–1.24)
Chloride: 108 mmol/L (ref 98–111)
GFR calc Af Amer: >60 mL/min (ref >60)
GFR calc non Af Amer: >60 mL/min (ref >60)
GLUCOSE: 85 mg/dL (ref 70–99)
Potassium: 3.3 mmol/L — ABNORMAL LOW (ref 3.5–5.1)
Sodium: 139 mmol/L (ref 135–145)
TOTAL PROTEIN: 7.9 g/dL (ref 6.5–8.1)

## 2017-12-14 LAB — RETICULOCYTES
IMMATURE RETIC FRACT: 26.9 % — AB (ref 2.3–15.9)
RBC.: 2.72 MIL/uL — ABNORMAL LOW (ref 4.22–5.81)
RETIC COUNT ABSOLUTE: 293.2 10*3/uL — AB (ref 19.0–186.0)
Retic Ct Pct: 10.8 % — ABNORMAL HIGH (ref 0.4–3.1)

## 2017-12-14 LAB — URINALYSIS, ROUTINE W REFLEX MICROSCOPIC
BILIRUBIN URINE: NEGATIVE
GLUCOSE, UA: NEGATIVE mg/dL
Ketones, ur: NEGATIVE mg/dL
LEUKOCYTES UA: NEGATIVE
NITRITE: NEGATIVE
PH: 7 (ref 5.0–8.0)
Protein, ur: NEGATIVE mg/dL
Specific Gravity, Urine: 1.01 (ref 1.005–1.030)

## 2017-12-14 LAB — RAPID URINE DRUG SCREEN, HOSP PERFORMED
AMPHETAMINES: NOT DETECTED
Barbiturates: NOT DETECTED
Benzodiazepines: NOT DETECTED
COCAINE: NOT DETECTED
OPIATES: NOT DETECTED
TETRAHYDROCANNABINOL: NOT DETECTED

## 2017-12-14 LAB — URINALYSIS, MICROSCOPIC (REFLEX): WBC, UA: NONE SEEN WBC/hpf (ref 0–5)

## 2017-12-14 LAB — TROPONIN I: Troponin I: <0.03 ng/mL (ref <0.03)

## 2017-12-14 LAB — LIPASE, BLOOD: LIPASE: 27 U/L (ref 11–51)

## 2017-12-14 MED ORDER — VANCOMYCIN HCL IN DEXTROSE 1-5 GM/200ML-% IV SOLN
1000.0000 mg | Freq: Once | INTRAVENOUS | Status: DC
  Filled 2017-12-14: qty 200

## 2017-12-14 MED ORDER — METOCLOPRAMIDE HCL 5 MG/ML IJ SOLN
10.0000 mg | INTRAMUSCULAR | Status: AC
  Administered 2017-12-14: 10 mg via INTRAVENOUS
  Filled 2017-12-14: qty 2

## 2017-12-14 MED ORDER — HYDROMORPHONE HCL 1 MG/ML IJ SOLN
1.0000 mg | Freq: Once | INTRAMUSCULAR | Status: AC
  Administered 2017-12-14: 1 mg via INTRAMUSCULAR
  Filled 2017-12-14: qty 1

## 2017-12-14 MED ORDER — SODIUM CHLORIDE 0.9 % IV SOLN
INTRAVENOUS | Status: DC | PRN
  Administered 2017-12-14: 1000 mL via INTRAVENOUS

## 2017-12-14 MED ORDER — DOXYCYCLINE HYCLATE 100 MG PO CAPS: 100.0000 mg | ORAL_CAPSULE | Freq: Two times a day (BID) | ORAL | 0 refills | Status: DC

## 2017-12-14 MED ORDER — SODIUM CHLORIDE 0.9 % IV SOLN
2.0000 g | Freq: Once | INTRAVENOUS | Status: DC
  Filled 2017-12-14: qty 2

## 2017-12-14 MED ORDER — ONDANSETRON 4 MG PO TBDP
4.0000 mg | ORAL_TABLET | Freq: Once | ORAL | Status: AC
  Administered 2017-12-14: 4 mg via ORAL
  Filled 2017-12-14: qty 1

## 2017-12-14 MED ORDER — KETOROLAC TROMETHAMINE 30 MG/ML IJ SOLN
30.0000 mg | Freq: Once | INTRAMUSCULAR | Status: AC
  Administered 2017-12-14: 30 mg via INTRAMUSCULAR
  Filled 2017-12-14: qty 1

## 2017-12-14 MED ORDER — AZTREONAM 1 G IJ SOLR
INTRAMUSCULAR | Status: AC
  Filled 2017-12-14: qty 2

## 2017-12-14 MED ORDER — HYDROMORPHONE HCL 1 MG/ML IJ SOLN
1.0000 mg | Freq: Once | INTRAMUSCULAR | Status: AC
  Administered 2017-12-14: 1 mg via INTRAVENOUS
  Filled 2017-12-14: qty 1

## 2017-12-14 MED ORDER — OXYCODONE HCL 5 MG PO TABS
30.0000 mg | ORAL_TABLET | Freq: Once | ORAL | Status: AC
  Administered 2017-12-14: 30 mg via ORAL
  Filled 2017-12-14: qty 6

## 2017-12-14 MED ORDER — VANCOMYCIN HCL IN DEXTROSE 1-5 GM/200ML-% IV SOLN: 1000.0000 mg | Freq: Three times a day (TID) | INTRAVENOUS | Status: DC

## 2017-12-14 NOTE — ED Triage Notes (Signed)
Sickle Cell pain started 2 days ago.  PCP advised him to come to the ED.

## 2017-12-14 NOTE — ED Notes (Signed)
Informed EDP that patient wants to go home, grandmother just died at home. Dr. Clarene Duke came to the room to explain to patient the necessity of having the antibiotic IV.  Patient verbalized understanding but still insisted of going home.

## 2017-12-14 NOTE — Discharge Instructions (Signed)
YOU HAVE AN INFECTION IN YOUR LUNGS. BECAUSE YOU ARE HAVING CHEST PAIN, IT IS POSSIBLE THAT YOU HAVE ACUTE CHEST SYNDROME. GO DIRECTLY TO [id number] EMERGENCY DEPARTMENT TO BE ADMITTED TO THE HOSPITAL AS SOON AS YOU CAN. CALL AN AMBULANCE IF YOU HAVE ANY RESPIRATORY DISTRESS OR SEVERE WORSENING OF YOUR SYMPTOMS.

## 2017-12-14 NOTE — Progress Notes (Signed)
Pharmacy Antibiotic Note  Matthew Shannon is a 27 y.o. male admitted on 12/14/2017 with pneumonia.  Pharmacy has been consulted for vancomcyin dosing.  Currently, patient is afebrile and WBC 21.   Plan: Vancomycin 1000 mg IV once, then 1000 mg IV q8h Aztreonam 2 gm IV once per MD Monitor renal function and vancomycin trough at steady-state F/u cultures and length of therapy F/u continuation of aztreonam  Height: 5\' 10"  (177.8 cm) Weight: 135 lb (61.2 kg) IBW/kg (Calculated) : 73  Temp (24hrs), Avg:98.7 F (37.1 C), Min:98.7 F (37.1 C), Max:98.7 F (37.1 C)  Recent Labs  Lab 12/14/17 1646  WBC 21.0*  CREATININE 0.49*    Estimated Creatinine Clearance: 117.9 mL/min (A) (by C-G formula based on SCr of 0.49 mg/dL (L)).    Allergies  Allergen Reactions  . Ceftriaxone Shortness Of Breath, Itching, Other (See Comments) and Cough    Tolerates pip/tazo  (Patient unable to confirm this.)   . Zosyn [Piperacillin Sod-Tazobactam So] Shortness Of Breath    Has patient had a PCN reaction causing immediate rash, facial/tongue/throat swelling, SOB or lightheadedness with hypotension: Unknown Has patient had a PCN reaction causing severe rash involving mucus membranes or skin necrosis: Unknown Has patient had a PCN reaction that required hospitalization: Unknown Has patient had a PCN reaction occurring within the last 10 years: Unknown If all of the above answers are "NO", then may proceed with Cephalosporin use.     Antimicrobials this admission: Aztreonam 11/5>> Vancomycin 11/5>>  Microbiology results: 11/5 BCx:  Thank you for allowing pharmacy to be a part of this patient's care.  Chauncey Mann, Pharmacy Student

## 2017-12-14 NOTE — ED Notes (Signed)
Clarified with patient if he wants the IV antibiotic before I discontinued his IV.  He still wants to go home.  Father enrout to pick him up.
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
## 2017-12-17 ENCOUNTER — Emergency Department (HOSPITAL_BASED_OUTPATIENT_CLINIC_OR_DEPARTMENT_OTHER)
Admission: EM | Admit: 2017-12-17 | Discharge: 2017-12-17 | Disposition: A | Discharge status: Home / Self Care | Payer: Medicaid Other | Attending: Emergency Medicine | Admitting: Emergency Medicine

## 2017-12-17 ENCOUNTER — Encounter (HOSPITAL_BASED_OUTPATIENT_CLINIC_OR_DEPARTMENT_OTHER): Payer: Self-pay | Admitting: Emergency Medicine

## 2017-12-17 ENCOUNTER — Other Ambulatory Visit: Payer: Self-pay | Admitting: Internal Medicine

## 2017-12-17 ENCOUNTER — Other Ambulatory Visit: Payer: Self-pay

## 2017-12-17 ENCOUNTER — Telehealth: Payer: Self-pay

## 2017-12-17 ENCOUNTER — Emergency Department (HOSPITAL_BASED_OUTPATIENT_CLINIC_OR_DEPARTMENT_OTHER): Payer: Medicaid Other

## 2017-12-17 DIAGNOSIS — Z79899 Other long term (current) drug therapy: Secondary | ICD-10-CM | POA: Insufficient documentation

## 2017-12-17 DIAGNOSIS — D5700 Hb-SS disease with crisis, unspecified: Secondary | ICD-10-CM | POA: Insufficient documentation

## 2017-12-17 DIAGNOSIS — J189 Pneumonia, unspecified organism: Secondary | ICD-10-CM | POA: Insufficient documentation

## 2017-12-17 DIAGNOSIS — R112 Nausea with vomiting, unspecified: Secondary | ICD-10-CM | POA: Diagnosis not present

## 2017-12-17 DIAGNOSIS — F17210 Nicotine dependence, cigarettes, uncomplicated: Secondary | ICD-10-CM | POA: Insufficient documentation

## 2017-12-17 DIAGNOSIS — R079 Chest pain, unspecified: Secondary | ICD-10-CM | POA: Diagnosis present

## 2017-12-17 LAB — CBC WITH DIFFERENTIAL/PLATELET
ABS IMMATURE GRANULOCYTES: 0.16 10*3/uL — AB (ref 0.00–0.07)
BASOS PCT: 0 %
Basophils Absolute: 0.1 10*3/uL (ref 0.0–0.1)
Eosinophils Absolute: 0.6 10*3/uL — ABNORMAL HIGH (ref 0.0–0.5)
Eosinophils Relative: 4 %
HCT: 21.2 % — ABNORMAL LOW (ref 39.0–52.0)
Hemoglobin: 7.2 g/dL — ABNORMAL LOW (ref 13.0–17.0)
Immature Granulocytes: 1 %
Lymphocytes Relative: 28 %
Lymphs Abs: 4.2 10*3/uL — ABNORMAL HIGH (ref 0.7–4.0)
MCH: 32.9 pg (ref 26.0–34.0)
MCHC: 34 g/dL (ref 30.0–36.0)
MCV: 96.8 fL (ref 80.0–100.0)
Monocytes Absolute: 1.7 10*3/uL — ABNORMAL HIGH (ref 0.1–1.0)
Monocytes Relative: 12 %
NEUTROS ABS: 8.1 10*3/uL — AB (ref 1.7–7.7)
Neutrophils Relative %: 55 %
PLATELETS: 275 10*3/uL (ref 150–400)
RBC: 2.19 MIL/uL — AB (ref 4.22–5.81)
RDW: 19.1 % — AB (ref 11.5–15.5)
WBC: 14.8 10*3/uL — AB (ref 4.0–10.5)
nRBC: 0.5 % — ABNORMAL HIGH (ref 0.0–0.2)

## 2017-12-17 LAB — COMPREHENSIVE METABOLIC PANEL
ALT: 8 U/L (ref 0–44)
ANION GAP: 7 (ref 5–15)
AST: 26 U/L (ref 15–41)
Albumin: 4 g/dL (ref 3.5–5.0)
Alkaline Phosphatase: 67 U/L (ref 38–126)
BUN: 8 mg/dL (ref 6–20)
CHLORIDE: 105 mmol/L (ref 98–111)
CO2: 26 mmol/L (ref 22–32)
CREATININE: 0.44 mg/dL — AB (ref 0.61–1.24)
Calcium: 8.9 mg/dL (ref 8.9–10.3)
Glucose, Bld: 87 mg/dL (ref 70–99)
Potassium: 3.6 mmol/L (ref 3.5–5.1)
Sodium: 138 mmol/L (ref 135–145)
Total Bilirubin: 4 mg/dL — ABNORMAL HIGH (ref 0.3–1.2)
Total Protein: 7.3 g/dL (ref 6.5–8.1)

## 2017-12-17 MED ORDER — SODIUM CHLORIDE 0.45 % IV SOLN
INTRAVENOUS | Status: DC
  Administered 2017-12-17: 18:00:00 via INTRAVENOUS

## 2017-12-17 MED ORDER — ONDANSETRON HCL 4 MG/2ML IJ SOLN
4.0000 mg | INTRAMUSCULAR | Status: DC | PRN
  Filled 2017-12-17: qty 2

## 2017-12-17 MED ORDER — DIPHENHYDRAMINE HCL 25 MG PO CAPS
25.0000 mg | ORAL_CAPSULE | Freq: Once | ORAL | Status: AC
  Administered 2017-12-17: 25 mg via ORAL
  Filled 2017-12-17: qty 1

## 2017-12-17 MED ORDER — HYDROMORPHONE HCL 1 MG/ML IJ SOLN
1.0000 mg | Freq: Once | INTRAMUSCULAR | Status: AC
  Administered 2017-12-17: 1 mg via SUBCUTANEOUS
  Filled 2017-12-17: qty 1

## 2017-12-17 MED ORDER — OXYCODONE HCL 5 MG PO TABS
30.0000 mg | ORAL_TABLET | Freq: Once | ORAL | Status: AC
  Administered 2017-12-17: 30 mg via ORAL
  Filled 2017-12-17: qty 6

## 2017-12-17 MED ORDER — PROMETHAZINE HCL 25 MG/ML IJ SOLN
12.5000 mg | Freq: Once | INTRAMUSCULAR | Status: AC
  Administered 2017-12-17: 12.5 mg via INTRAMUSCULAR
  Filled 2017-12-17: qty 1

## 2017-12-17 MED ORDER — PROMETHAZINE HCL 25 MG PO TABS
12.5000 mg | ORAL_TABLET | Freq: Once | ORAL | Status: AC
  Administered 2017-12-17: 12.5 mg via ORAL
  Filled 2017-12-17: qty 1

## 2017-12-17 MED ORDER — KETOROLAC TROMETHAMINE 15 MG/ML IJ SOLN
15.0000 mg | INTRAMUSCULAR | Status: AC
  Administered 2017-12-17: 15 mg via INTRAVENOUS
  Filled 2017-12-17: qty 1

## 2017-12-17 NOTE — ED Notes (Signed)
RN, Resp, u/s for IV stick

## 2017-12-17 NOTE — ED Notes (Signed)
PT requesting another pain medication. HE reports his pain is still a 7/10. Informed MD.

## 2017-12-17 NOTE — ED Triage Notes (Signed)
Per EMS:  Back pain since Saturday.  Pt informed them that it was his sickle cell pain.
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
## 2017-12-17 NOTE — ED Notes (Signed)
Pt called out again, referencing increased pain. MD made aware. MD in to speak with patient.

## 2017-12-17 NOTE — ED Notes (Signed)
Pt called out requesting more pain medication. MD made aware.

## 2017-12-17 NOTE — ED Notes (Signed)
As EMT was walking in room to take pts IV out. Pt was seen in left side cabinet and drawers and stuffing things in his pockets.

## 2017-12-19 LAB — CULTURE, BLOOD (ROUTINE X 2)
CULTURE: NO GROWTH
CULTURE: NO GROWTH
Special Requests: ADEQUATE
Special Requests: ADEQUATE

## 2017-12-20 ENCOUNTER — Telehealth (HOSPITAL_COMMUNITY): Payer: Self-pay | Admitting: General Practice

## 2017-12-20 NOTE — Telephone Encounter (Signed)
Patient called, complained of generalized pain that he rated as 10/10. Endorsed left sided chest pain - typical of his sickle cell crisis, denied  fever, diarrhea, abdominal pain but endorsed  nausea/vomitting. Admitted to having means of transportation without driving self after treatment. Last took prescribed pain medication yesterday. Presently, he has run out of medication. RN checked on his refill, it is due for refill tomorrow. Provider notified; per provider, patient called too late today. Patient notified, told to call back first thing tomorrow morning, patient to pick up his refill tomorrow and to go to the nearest ER today if he is in crisis. Patient called back, verbalized understanding.

## 2017-12-21 ENCOUNTER — Ambulatory Visit: Payer: Self-pay | Admitting: Internal Medicine

## 2017-12-21 ENCOUNTER — Telehealth: Payer: Self-pay

## 2017-12-21 ENCOUNTER — Telehealth (HOSPITAL_COMMUNITY): Payer: Self-pay | Admitting: General Practice

## 2017-12-21 ENCOUNTER — Other Ambulatory Visit: Payer: Self-pay | Admitting: Internal Medicine

## 2017-12-21 MED ORDER — OXYCODONE HCL 30 MG PO TABS: 30.0000 mg | ORAL_TABLET | ORAL | 0 refills | Status: DC | PRN

## 2017-12-21 NOTE — Telephone Encounter (Signed)
Refilled

## 2017-12-21 NOTE — Telephone Encounter (Signed)
Patient called, complained of pain in the back that he rated as 7/10. Endorsed chest pain, denied fever, diarrhea, abdominal pain, nausea/vomitting and priapism. Admitted to having means of transportation without driving self after treatment. Last took Oxycodone two days ago. Has run out of prescribed pain medication. Provider notified; per provider, patient should go to the ER to have the chest pain evaluated, to stay hydrated with 64 ounces of water. Medication refill will be sent sent in for pick up today and patient to call back in the morning if he does not feel better.  Patient notified, verbalized understanding.

## 2017-12-24 ENCOUNTER — Inpatient Hospital Stay (HOSPITAL_BASED_OUTPATIENT_CLINIC_OR_DEPARTMENT_OTHER)
Admission: EM | Admit: 2017-12-24 | Discharge: 2017-12-29 | DRG: 812 | Disposition: A | Discharge status: Home / Self Care | Payer: Medicaid Other | Attending: Internal Medicine | Admitting: Internal Medicine

## 2017-12-24 ENCOUNTER — Other Ambulatory Visit: Payer: Self-pay

## 2017-12-24 ENCOUNTER — Encounter (HOSPITAL_BASED_OUTPATIENT_CLINIC_OR_DEPARTMENT_OTHER): Payer: Self-pay | Admitting: *Deleted

## 2017-12-24 ENCOUNTER — Emergency Department (HOSPITAL_BASED_OUTPATIENT_CLINIC_OR_DEPARTMENT_OTHER): Payer: Medicaid Other

## 2017-12-24 DIAGNOSIS — R197 Diarrhea, unspecified: Secondary | ICD-10-CM | POA: Diagnosis present

## 2017-12-24 DIAGNOSIS — G894 Chronic pain syndrome: Secondary | ICD-10-CM | POA: Diagnosis present

## 2017-12-24 DIAGNOSIS — Z825 Family history of asthma and other chronic lower respiratory diseases: Secondary | ICD-10-CM | POA: Diagnosis not present

## 2017-12-24 DIAGNOSIS — Z833 Family history of diabetes mellitus: Secondary | ICD-10-CM

## 2017-12-24 DIAGNOSIS — Z832 Family history of diseases of the blood and blood-forming organs and certain disorders involving the immune mechanism: Secondary | ICD-10-CM | POA: Diagnosis not present

## 2017-12-24 DIAGNOSIS — F17200 Nicotine dependence, unspecified, uncomplicated: Secondary | ICD-10-CM | POA: Diagnosis not present

## 2017-12-24 DIAGNOSIS — Z881 Allergy status to other antibiotic agents status: Secondary | ICD-10-CM

## 2017-12-24 DIAGNOSIS — Z9049 Acquired absence of other specified parts of digestive tract: Secondary | ICD-10-CM

## 2017-12-24 DIAGNOSIS — R229 Localized swelling, mass and lump, unspecified: Secondary | ICD-10-CM | POA: Diagnosis not present

## 2017-12-24 DIAGNOSIS — Z791 Long term (current) use of non-steroidal anti-inflammatories (NSAID): Secondary | ICD-10-CM

## 2017-12-24 DIAGNOSIS — Z888 Allergy status to other drugs, medicaments and biological substances status: Secondary | ICD-10-CM

## 2017-12-24 DIAGNOSIS — D5700 Hb-SS disease with crisis, unspecified: Principal | ICD-10-CM | POA: Diagnosis present

## 2017-12-24 DIAGNOSIS — Z79899 Other long term (current) drug therapy: Secondary | ICD-10-CM | POA: Diagnosis not present

## 2017-12-24 DIAGNOSIS — F17210 Nicotine dependence, cigarettes, uncomplicated: Secondary | ICD-10-CM | POA: Diagnosis present

## 2017-12-24 DIAGNOSIS — D72829 Elevated white blood cell count, unspecified: Secondary | ICD-10-CM | POA: Diagnosis present

## 2017-12-24 DIAGNOSIS — Z7952 Long term (current) use of systemic steroids: Secondary | ICD-10-CM

## 2017-12-24 DIAGNOSIS — R220 Localized swelling, mass and lump, head: Secondary | ICD-10-CM | POA: Diagnosis not present

## 2017-12-24 DIAGNOSIS — Z716 Tobacco abuse counseling: Secondary | ICD-10-CM

## 2017-12-24 DIAGNOSIS — D649 Anemia, unspecified: Secondary | ICD-10-CM | POA: Diagnosis not present

## 2017-12-24 DIAGNOSIS — D638 Anemia in other chronic diseases classified elsewhere: Secondary | ICD-10-CM | POA: Diagnosis not present

## 2017-12-24 LAB — CBC WITH DIFFERENTIAL/PLATELET
Abs Immature Granulocytes: 0.25 10*3/uL — ABNORMAL HIGH (ref 0.00–0.07)
BASOS ABS: 0.1 10*3/uL (ref 0.0–0.1)
BASOS PCT: 1 %
EOS ABS: 0.7 10*3/uL — AB (ref 0.0–0.5)
Eosinophils Relative: 4 %
HEMATOCRIT: 22.2 % — AB (ref 39.0–52.0)
HEMOGLOBIN: 7.6 g/dL — AB (ref 13.0–17.0)
IMMATURE GRANULOCYTES: 1 %
Lymphocytes Relative: 21 %
Lymphs Abs: 3.8 10*3/uL (ref 0.7–4.0)
MCH: 33.3 pg (ref 26.0–34.0)
MCHC: 34.2 g/dL (ref 30.0–36.0)
MCV: 97.4 fL (ref 80.0–100.0)
Monocytes Absolute: 1.6 10*3/uL — ABNORMAL HIGH (ref 0.1–1.0)
Monocytes Relative: 9 %
NEUTROS ABS: 11.5 10*3/uL — AB (ref 1.7–7.7)
NEUTROS PCT: 64 %
NRBC: 1.3 % — AB (ref 0.0–0.2)
Platelets: 327 10*3/uL (ref 150–400)
RBC: 2.28 MIL/uL — AB (ref 4.22–5.81)
RDW: 21.3 % — AB (ref 11.5–15.5)
WBC: 18 10*3/uL — ABNORMAL HIGH (ref 4.0–10.5)

## 2017-12-24 LAB — RETICULOCYTES
Immature Retic Fract: 28.7 % — ABNORMAL HIGH (ref 2.3–15.9)
RBC.: 2.29 MIL/uL — AB (ref 4.22–5.81)
RETIC COUNT ABSOLUTE: 416.2 10*3/uL — AB (ref 19.0–186.0)
Retic Ct Pct: 17.5 % — ABNORMAL HIGH (ref 0.4–3.1)

## 2017-12-24 LAB — COMPREHENSIVE METABOLIC PANEL
ALK PHOS: 73 U/L (ref 38–126)
ALT: 19 U/L (ref 0–44)
ANION GAP: 8 (ref 5–15)
AST: 34 U/L (ref 15–41)
Albumin: 4.2 g/dL (ref 3.5–5.0)
BILIRUBIN TOTAL: 5.2 mg/dL — AB (ref 0.3–1.2)
BUN: 5 mg/dL — ABNORMAL LOW (ref 6–20)
CALCIUM: 9.2 mg/dL (ref 8.9–10.3)
CO2: 27 mmol/L (ref 22–32)
Chloride: 105 mmol/L (ref 98–111)
Creatinine, Ser: 0.48 mg/dL — ABNORMAL LOW (ref 0.61–1.24)
GFR calc non Af Amer: >60 mL/min (ref >60)
Glucose, Bld: 71 mg/dL (ref 70–99)
POTASSIUM: 3.4 mmol/L — AB (ref 3.5–5.1)
SODIUM: 140 mmol/L (ref 135–145)
TOTAL PROTEIN: 7.5 g/dL (ref 6.5–8.1)

## 2017-12-24 MED ORDER — SODIUM CHLORIDE 0.9% FLUSH: 9.0000 mL | INTRAVENOUS | Status: DC | PRN

## 2017-12-24 MED ORDER — HYDROMORPHONE 1 MG/ML IV SOLN
INTRAVENOUS | Status: DC
  Administered 2017-12-24: 30 mg via INTRAVENOUS
  Filled 2017-12-24: qty 30

## 2017-12-24 MED ORDER — DIPHENHYDRAMINE HCL 25 MG PO CAPS
25.0000 mg | ORAL_CAPSULE | ORAL | Status: DC | PRN
  Administered 2017-12-24: 25 mg via ORAL
  Filled 2017-12-24: qty 1

## 2017-12-24 MED ORDER — KETOROLAC TROMETHAMINE 15 MG/ML IJ SOLN
15.0000 mg | Freq: Four times a day (QID) | INTRAMUSCULAR | Status: DC
  Administered 2017-12-24 – 2017-12-25 (×2): 15 mg via INTRAVENOUS
  Filled 2017-12-24 (×2): qty 1

## 2017-12-24 MED ORDER — DEXTROSE-NACL 5-0.45 % IV SOLN
INTRAVENOUS | Status: AC
  Administered 2017-12-24 – 2017-12-25 (×3): via INTRAVENOUS

## 2017-12-24 MED ORDER — SENNOSIDES-DOCUSATE SODIUM 8.6-50 MG PO TABS
1.0000 | ORAL_TABLET | Freq: Two times a day (BID) | ORAL | Status: DC
  Administered 2017-12-28: 1 via ORAL
  Filled 2017-12-24 (×5): qty 1

## 2017-12-24 MED ORDER — PROMETHAZINE HCL 25 MG PO TABS
25.0000 mg | ORAL_TABLET | Freq: Once | ORAL | Status: AC
  Administered 2017-12-24: 25 mg via ORAL
  Filled 2017-12-24: qty 1

## 2017-12-24 MED ORDER — HYDROMORPHONE HCL 1 MG/ML IJ SOLN
1.0000 mg | Freq: Once | INTRAMUSCULAR | Status: AC
  Administered 2017-12-24: 1 mg via INTRAVENOUS
  Filled 2017-12-24: qty 1

## 2017-12-24 MED ORDER — HYDROMORPHONE HCL 1 MG/ML IJ SOLN
2.0000 mg | Freq: Once | INTRAMUSCULAR | Status: DC
  Filled 2017-12-24: qty 2

## 2017-12-24 MED ORDER — HYDROMORPHONE HCL 1 MG/ML IJ SOLN
2.0000 mg | Freq: Once | INTRAMUSCULAR | Status: AC
  Administered 2017-12-24: 2 mg via SUBCUTANEOUS
  Filled 2017-12-24: qty 2

## 2017-12-24 MED ORDER — HYDROMORPHONE HCL 1 MG/ML IJ SOLN
2.0000 mg | Freq: Once | INTRAMUSCULAR | Status: AC
  Administered 2017-12-24: 2 mg via INTRAVENOUS

## 2017-12-24 MED ORDER — ENOXAPARIN SODIUM 40 MG/0.4ML ~~LOC~~ SOLN
40.0000 mg | Freq: Every day | SUBCUTANEOUS | Status: DC
  Filled 2017-12-24 (×2): qty 0.4

## 2017-12-24 MED ORDER — HYDROMORPHONE HCL 1 MG/ML IJ SOLN
1.0000 mg | Freq: Once | INTRAMUSCULAR | Status: AC
  Administered 2017-12-24: 1 mg via SUBCUTANEOUS
  Filled 2017-12-24: qty 1

## 2017-12-24 MED ORDER — NALOXONE HCL 0.4 MG/ML IJ SOLN: 0.4000 mg | INTRAMUSCULAR | Status: DC | PRN

## 2017-12-24 MED ORDER — DEXTROSE-NACL 5-0.45 % IV SOLN
INTRAVENOUS | Status: DC
  Administered 2017-12-24: 20:00:00 via INTRAVENOUS

## 2017-12-24 MED ORDER — POLYETHYLENE GLYCOL 3350 17 G PO PACK: 17.0000 g | PACK | Freq: Every day | ORAL | Status: DC | PRN

## 2017-12-24 MED ORDER — HYDROXYUREA 500 MG PO CAPS
1000.0000 mg | ORAL_CAPSULE | Freq: Every day | ORAL | Status: DC
  Administered 2017-12-25 – 2017-12-29 (×5): 1000 mg via ORAL
  Filled 2017-12-24 (×5): qty 2

## 2017-12-24 MED ORDER — FOLIC ACID 1 MG PO TABS
1.0000 mg | ORAL_TABLET | Freq: Every day | ORAL | Status: DC
  Administered 2017-12-25 – 2017-12-29 (×5): 1 mg via ORAL
  Filled 2017-12-24 (×5): qty 1

## 2017-12-24 MED ORDER — OXYCODONE HCL 5 MG PO TABS
30.0000 mg | ORAL_TABLET | Freq: Once | ORAL | Status: AC
  Administered 2017-12-24: 30 mg via ORAL
  Filled 2017-12-24: qty 6

## 2017-12-24 MED ORDER — KETOROLAC TROMETHAMINE 30 MG/ML IJ SOLN
30.0000 mg | INTRAMUSCULAR | Status: AC
  Administered 2017-12-24: 30 mg via INTRAMUSCULAR
  Filled 2017-12-24: qty 1

## 2017-12-24 MED ORDER — ONDANSETRON HCL 4 MG/2ML IJ SOLN
4.0000 mg | Freq: Four times a day (QID) | INTRAMUSCULAR | Status: DC | PRN
  Administered 2017-12-24: 4 mg via INTRAVENOUS
  Filled 2017-12-24: qty 2
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
## 2017-12-24 NOTE — ED Triage Notes (Signed)
To ED via EMS with sickle cell Pain. Care Plan noted.

## 2017-12-24 NOTE — ED Notes (Signed)
Pt taking po water, no emesis

## 2017-12-24 NOTE — ED Notes (Signed)
Pt eating frozen dinner, tolerated po's. No nausea or emesis. States pain is a little better.
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
## 2017-12-24 NOTE — ED Notes (Signed)
Pt with sickle cell pain.

## 2017-12-25 LAB — CBC WITH DIFFERENTIAL/PLATELET
Abs Immature Granulocytes: 0.3 10*3/uL — ABNORMAL HIGH (ref 0.00–0.07)
BASOS ABS: 0.1 10*3/uL (ref 0.0–0.1)
BASOS PCT: 1 %
Eosinophils Absolute: 0.7 10*3/uL — ABNORMAL HIGH (ref 0.0–0.5)
Eosinophils Relative: 5 %
HCT: 20.1 % — ABNORMAL LOW (ref 39.0–52.0)
Hemoglobin: 6.8 g/dL — CL (ref 13.0–17.0)
Immature Granulocytes: 2 %
Lymphocytes Relative: 23 %
Lymphs Abs: 3.7 10*3/uL (ref 0.7–4.0)
MCH: 33 pg (ref 26.0–34.0)
MCHC: 33.8 g/dL (ref 30.0–36.0)
MCV: 97.6 fL (ref 80.0–100.0)
Monocytes Absolute: 1.7 10*3/uL — ABNORMAL HIGH (ref 0.1–1.0)
Monocytes Relative: 10 %
NEUTROS ABS: 9.9 10*3/uL — AB (ref 1.7–7.7)
NEUTROS PCT: 59 %
NRBC: 1.4 % — AB (ref 0.0–0.2)
PLATELETS: 281 10*3/uL (ref 150–400)
RBC: 2.06 MIL/uL — ABNORMAL LOW (ref 4.22–5.81)
RDW: 21 % — AB (ref 11.5–15.5)
WBC: 16.4 10*3/uL — ABNORMAL HIGH (ref 4.0–10.5)

## 2017-12-25 LAB — BASIC METABOLIC PANEL
ANION GAP: 5 (ref 5–15)
BUN: 5 mg/dL — ABNORMAL LOW (ref 6–20)
CALCIUM: 8.6 mg/dL — AB (ref 8.9–10.3)
CO2: 28 mmol/L (ref 22–32)
Chloride: 109 mmol/L (ref 98–111)
Creatinine, Ser: 0.45 mg/dL — ABNORMAL LOW (ref 0.61–1.24)
Glucose, Bld: 96 mg/dL (ref 70–99)
Potassium: 3.5 mmol/L (ref 3.5–5.1)
SODIUM: 142 mmol/L (ref 135–145)

## 2017-12-25 LAB — RAPID URINE DRUG SCREEN, HOSP PERFORMED
Amphetamines: NOT DETECTED
Barbiturates: NOT DETECTED
Benzodiazepines: NOT DETECTED
Cocaine: NOT DETECTED
Opiates: POSITIVE — AB
Tetrahydrocannabinol: NOT DETECTED

## 2017-12-25 LAB — LACTATE DEHYDROGENASE: LDH: 288 U/L — ABNORMAL HIGH (ref 98–192)

## 2017-12-25 MED ORDER — SODIUM CHLORIDE 0.9 % IV SOLN: 25.0000 mg | INTRAVENOUS | Status: DC | PRN

## 2017-12-25 MED ORDER — HYDROMORPHONE 1 MG/ML IV SOLN: INTRAVENOUS | Status: DC

## 2017-12-25 MED ORDER — OXYCODONE HCL 5 MG PO TABS
30.0000 mg | ORAL_TABLET | ORAL | Status: DC | PRN
  Administered 2017-12-25 – 2017-12-29 (×19): 30 mg via ORAL
  Filled 2017-12-25 (×21): qty 6

## 2017-12-25 MED ORDER — METOCLOPRAMIDE HCL 5 MG/ML IJ SOLN
10.0000 mg | Freq: Once | INTRAMUSCULAR | Status: AC
  Administered 2017-12-25: 10 mg via INTRAVENOUS
  Filled 2017-12-25: qty 2

## 2017-12-25 MED ORDER — IBUPROFEN 200 MG PO TABS: 400.0000 mg | ORAL_TABLET | Freq: Four times a day (QID) | ORAL | Status: DC | PRN

## 2017-12-25 MED ORDER — HYDROMORPHONE 1 MG/ML IV SOLN
INTRAVENOUS | Status: DC
  Administered 2017-12-25: 6.5 mg via INTRAVENOUS
  Administered 2017-12-25: 30 mg via INTRAVENOUS
  Administered 2017-12-25: 4.5 mg via INTRAVENOUS
  Administered 2017-12-25: 9.94 mg via INTRAVENOUS
  Administered 2017-12-26: 5 mg via INTRAVENOUS
  Administered 2017-12-26: 30 mg via INTRAVENOUS
  Administered 2017-12-26: 9.5 mg via INTRAVENOUS
  Administered 2017-12-26: 7 mg via INTRAVENOUS
  Administered 2017-12-26: 0 mg via INTRAVENOUS
  Administered 2017-12-26: 4 mg via INTRAVENOUS
  Administered 2017-12-26: 7 mg via INTRAVENOUS
  Administered 2017-12-26: 3 mg via INTRAVENOUS
  Administered 2017-12-27: 8 mg via INTRAVENOUS
  Administered 2017-12-27: 107 mg via INTRAVENOUS
  Filled 2017-12-25 (×2): qty 30

## 2017-12-25 MED ORDER — NALOXONE HCL 0.4 MG/ML IJ SOLN: 0.4000 mg | INTRAMUSCULAR | Status: DC | PRN

## 2017-12-25 MED ORDER — HYDROMORPHONE 1 MG/ML IV SOLN
INTRAVENOUS | Status: DC
  Administered 2017-12-25 (×2): 1.56 mg via INTRAVENOUS
  Administered 2017-12-25: 4 mg via INTRAVENOUS

## 2017-12-25 MED ORDER — PROMETHAZINE HCL 25 MG PO TABS
25.0000 mg | ORAL_TABLET | Freq: Four times a day (QID) | ORAL | Status: DC | PRN
  Administered 2017-12-25 – 2017-12-29 (×8): 25 mg via ORAL
  Filled 2017-12-25 (×8): qty 1

## 2017-12-25 MED ORDER — SODIUM CHLORIDE 0.9% IV SOLUTION: Freq: Once | INTRAVENOUS | Status: DC

## 2017-12-25 MED ORDER — POTASSIUM CHLORIDE CRYS ER 20 MEQ PO TBCR
20.0000 meq | EXTENDED_RELEASE_TABLET | Freq: Once | ORAL | Status: AC
  Administered 2017-12-25: 20 meq via ORAL
  Filled 2017-12-25: qty 1

## 2017-12-25 MED ORDER — DIPHENHYDRAMINE HCL 25 MG PO CAPS
25.0000 mg | ORAL_CAPSULE | ORAL | Status: DC | PRN
  Administered 2017-12-26 – 2017-12-29 (×4): 25 mg via ORAL
  Filled 2017-12-25 (×4): qty 1

## 2017-12-25 MED ORDER — KETOROLAC TROMETHAMINE 15 MG/ML IJ SOLN
30.0000 mg | Freq: Four times a day (QID) | INTRAMUSCULAR | Status: DC
  Administered 2017-12-25 (×2): 30 mg via INTRAVENOUS
  Filled 2017-12-25 (×2): qty 2

## 2017-12-25 MED ORDER — ONDANSETRON HCL 4 MG/2ML IJ SOLN: 4.0000 mg | Freq: Four times a day (QID) | INTRAMUSCULAR | Status: DC | PRN

## 2017-12-25 MED ORDER — SODIUM CHLORIDE 0.9% FLUSH: 9.0000 mL | INTRAVENOUS | Status: DC | PRN

## 2017-12-25 NOTE — Progress Notes (Signed)
Patient ID: Matthew HarrisonBobby Shannon, male   DOB: 05/31/1990, 27 y.o.   MRN: [id number] Subjective:  Patient is a 27 years old with sickle cell disease admitted yesterday for pain crisis. He is atill in so much pain today, rating his pain at 9/10 localized to his lower limbs and lower back. He denies any fever, cough, chest pain and SOB.. Objective:  Vital signs in last 24 hours:  Vitals:   12/25/17 0449 12/25/17 0811 12/25/17 1017 12/25/17 1228  BP: 97/65  (!) 90/48   Pulse: 70  88   Resp: 12 12 14 13   Temp: 98.6 F (37 C)  98.4 F (36.9 C)   TempSrc: Oral  Oral   SpO2: 97% 100% 100% 95%  Weight:      Height:        Intake/Output from previous day:   Intake/Output Summary (Last 24 hours) at 12/25/2017 1307 Last data filed at 12/25/2017 1022 Gross per 24 hour  Intake 1546.58 ml  Output 1100 ml  Net 446.58 ml    Physical Exam: General: Alert, awake, oriented x3, in no acute distress.  HEENT: [place]/AT PEERL, EOMI Neck: Trachea midline,  no masses, no thyromegal,y no JVD, no carotid bruit OROPHARYNX:  Moist, No exudate/ erythema/lesions.  Heart: Regular rate and rhythm, without murmurs, rubs, gallops, PMI non-displaced, no heaves or thrills on palpation.  Lungs: Clear to auscultation, no wheezing or rhonchi noted. No increased vocal fremitus resonant to percussion  Abdomen: Soft, nontender, nondistended, positive bowel sounds, no masses no hepatosplenomegaly noted..  Neuro: No focal neurological deficits noted cranial nerves II through XII grossly intact. DTRs 2+ bilaterally upper and lower extremities. Strength 5 out of 5 in bilateral upper and lower extremities. Musculoskeletal: No warm swelling or erythema around joints, no spinal tenderness noted. Psychiatric: Patient alert and oriented x3, good insight and cognition, good recent to remote recall. Lymph node survey: No cervical axillary or inguinal lymphadenopathy noted.  Lab Results:  Basic Metabolic Panel:    Component Value  Date/Time   NA 142 12/25/2017 0457   NA 139 08/09/2017 0927   K 3.5 12/25/2017 0457   CL 109 12/25/2017 0457   CO2 28 12/25/2017 0457   BUN 5 (L) 12/25/2017 0457   BUN 5 (L) 08/09/2017 0927   CREATININE 0.45 (L) 12/25/2017 0457   GLUCOSE 96 12/25/2017 0457   CALCIUM 8.6 (L) 12/25/2017 0457   CBC:    Component Value Date/Time   WBC 16.4 (H) 12/25/2017 0457   HGB 6.8 (LL) 12/25/2017 0457   HGB 9.6 (L) 08/09/2017 0927   HCT 20.1 (L) 12/25/2017 0457   HCT 28.1 (L) 08/09/2017 0927   PLT 281 12/25/2017 0457   PLT 309 08/09/2017 0927   MCV 97.6 12/25/2017 0457   MCV 95 08/09/2017 0927   NEUTROABS 9.9 (H) 12/25/2017 0457   NEUTROABS 8.2 (H) 08/09/2017 0927   LYMPHSABS 3.7 12/25/2017 0457   LYMPHSABS 2.1 08/09/2017 0927   MONOABS 1.7 (H) 12/25/2017 0457   EOSABS 0.7 (H) 12/25/2017 0457   EOSABS 0.4 08/09/2017 0927   BASOSABS 0.1 12/25/2017 0457   BASOSABS 0.1 08/09/2017 0927    No results found for this or any previous visit (from the past 240 hour(s)).  Studies/Results: Dg Chest 2 View  Result Date: 12/24/2017 CLINICAL DATA:  Cough.  Sickle cell crisis.  Smoker. EXAM: CHEST - 2 VIEW COMPARISON:  12/17/2017 FINDINGS: Cardiac enlargement. Negative for heart failure. Patchy bibasilar airspace appears chronic and unchanged. No acute infiltrate or effusion. IMPRESSION:  Mild bibasilar airspace disease similar to prior studies and consistent with scarring. No definite pneumonia. Electronically Signed   By: Marlan Palau M.D.   On: 12/24/2017 16:10    Medications: Scheduled Meds: . sodium chloride   Intravenous Once  . enoxaparin (LOVENOX) injection  40 mg Subcutaneous QHS  . folic acid  1 mg Oral Daily  . HYDROmorphone   Intravenous Q4H  . hydroxyurea  1,000 mg Oral Daily  . ketorolac  30 mg Intravenous Q6H  . senna-docusate  1 tablet Oral BID   Continuous Infusions: . dextrose 5 % and 0.45% NaCl 100 mL/hr at 12/25/17 0951   PRN Meds:.diphenhydrAMINE **OR** [DISCONTINUED]  diphenhydrAMINE, polyethylene glycol, promethazine  Assessment/Plan: Principal Problem:   Sickle cell pain crisis (HCC) Active Problems:   Sickle cell anemia with pain (HCC)  1. Hb Sickle Cell Disease with crisis: Continue IVF D5 .45% Saline @ 100 mls/hour, continue weight based Dilaudid PCA, IV Toradol 30 mg Q 6 H, Monitor vitals very closely, Re-evaluate pain scale regularly, 2 L of Oxygen by [place]. 2. Sickle Cell Anemia: Hb dropped below baseline to 6.8 today, although no immediate indication for blood transfusion, we will keep a close eye on the Hb, any further drop will warrant a packed red cell transfusion. CBC in am. 3. Chronic pain Syndrome: Restart home pain medication 4. Nicotine Use Disorder: Matthew Shannon was counseled on the dangers of tobacco use, and was advised to quit. Reviewed strategies to maximize success, including removing cigarettes and smoking materials from environment, stress management and support of family/friends.   Code Status: Full Code Family Communication: N/A Disposition Plan: Not yet ready for discharge  Matthew Shannon  If 7PM-7AM, please contact night-coverage.  12/25/2017, 1:07 PM  LOS: 1 day

## 2017-12-25 NOTE — Progress Notes (Signed)
Patient left the floor with his PCA pump, Security brought patient back to floor. DR Hyman HopesJegede was notified and stated that if patient attempts to leave the floor again it is an automatic discharge AMA. Will continue to monitor.

## 2017-12-25 NOTE — Progress Notes (Signed)
CRITICAL VALUE ALERT  Critical Value:  Hgb  6.8  Date & Time Notied:  12/25/2017 at 0643  Provider Notified: [facility]  Dr Bruna PotterBlount  Orders Received/Actions taken:  Awaiting MD, Will inform On-going staff nurse

## 2017-12-26 DIAGNOSIS — D649 Anemia, unspecified: Secondary | ICD-10-CM

## 2017-12-26 LAB — CBC WITH DIFFERENTIAL/PLATELET
ABS IMMATURE GRANULOCYTES: 0.21 10*3/uL — AB (ref 0.00–0.07)
Basophils Absolute: 0.1 10*3/uL (ref 0.0–0.1)
Basophils Relative: 1 %
Eosinophils Absolute: 0.7 10*3/uL — ABNORMAL HIGH (ref 0.0–0.5)
Eosinophils Relative: 6 %
HCT: 18.6 % — ABNORMAL LOW (ref 39.0–52.0)
Hemoglobin: 6.2 g/dL — CL (ref 13.0–17.0)
IMMATURE GRANULOCYTES: 2 %
Lymphocytes Relative: 21 %
Lymphs Abs: 2.7 10*3/uL (ref 0.7–4.0)
MCH: 32.8 pg (ref 26.0–34.0)
MCHC: 33.3 g/dL (ref 30.0–36.0)
MCV: 98.4 fL (ref 80.0–100.0)
Monocytes Absolute: 1.4 10*3/uL — ABNORMAL HIGH (ref 0.1–1.0)
Monocytes Relative: 10 %
NEUTROS ABS: 7.9 10*3/uL — AB (ref 1.7–7.7)
NEUTROS PCT: 60 %
PLATELETS: 277 10*3/uL (ref 150–400)
RBC: 1.89 MIL/uL — AB (ref 4.22–5.81)
RDW: 21.1 % — ABNORMAL HIGH (ref 11.5–15.5)
WBC: 13 10*3/uL — AB (ref 4.0–10.5)
nRBC: 1.3 % — ABNORMAL HIGH (ref 0.0–0.2)

## 2017-12-26 LAB — COMPREHENSIVE METABOLIC PANEL
ALBUMIN: 3.5 g/dL (ref 3.5–5.0)
ALT: 30 U/L (ref 0–44)
ANION GAP: 8 (ref 5–15)
AST: 45 U/L — ABNORMAL HIGH (ref 15–41)
Alkaline Phosphatase: 66 U/L (ref 38–126)
BUN: 5 mg/dL — ABNORMAL LOW (ref 6–20)
CO2: 26 mmol/L (ref 22–32)
Calcium: 8.4 mg/dL — ABNORMAL LOW (ref 8.9–10.3)
Chloride: 108 mmol/L (ref 98–111)
Creatinine, Ser: 0.44 mg/dL — ABNORMAL LOW (ref 0.61–1.24)
GFR calc Af Amer: >60 mL/min (ref >60)
GLUCOSE: 102 mg/dL — AB (ref 70–99)
POTASSIUM: 3.5 mmol/L (ref 3.5–5.1)
Sodium: 142 mmol/L (ref 135–145)
Total Bilirubin: 5.3 mg/dL — ABNORMAL HIGH (ref 0.3–1.2)
Total Protein: 6.1 g/dL — ABNORMAL LOW (ref 6.5–8.1)

## 2017-12-26 LAB — PREPARE RBC (CROSSMATCH)

## 2017-12-26 MED ORDER — HYDROMORPHONE HCL 2 MG/ML IJ SOLN
2.0000 mg | Freq: Once | INTRAMUSCULAR | Status: AC
  Administered 2017-12-26: 2 mg via INTRAVENOUS

## 2017-12-26 MED ORDER — SODIUM CHLORIDE 0.9% IV SOLUTION
Freq: Once | INTRAVENOUS | Status: AC
  Administered 2017-12-26: 16:00:00 via INTRAVENOUS

## 2017-12-26 MED ORDER — DEXTROSE-NACL 5-0.45 % IV SOLN
INTRAVENOUS | Status: AC
  Administered 2017-12-26 (×2): via INTRAVENOUS

## 2017-12-26 MED ORDER — KETOROLAC TROMETHAMINE 30 MG/ML IJ SOLN
30.0000 mg | Freq: Four times a day (QID) | INTRAMUSCULAR | Status: DC
  Administered 2017-12-26 – 2017-12-29 (×15): 30 mg via INTRAVENOUS
  Filled 2017-12-26 (×15): qty 1

## 2017-12-26 NOTE — Progress Notes (Signed)
Patient ID: Matthew Shannon, male   DOB: Jun 08, 1990, 27 y.o.   MRN: [id number] Subjective:  Matthew Shannon has no new complaint today but feels weak and tired. He claims his pain is down to 7/10, his goal is about 4-5/10. He denies any fever, no chest pain, no SOB. He is eating well, ambulating ok but with pain. Patient left the floor with his PCA pump yesterday, Security brought patient back to floor, when asked this morning, he said he only went downstairs to get fresh air. He denies any "wrong doing".  Objective:  Vital signs in last 24 hours:  Vitals:   12/26/17 0430 12/26/17 0432 12/26/17 0810 12/26/17 0949  BP: 90/60   (!) 95/56  Pulse: 73   80  Resp: 14 16 (!) 9 10  Temp: 98.2 F (36.8 C)   98.7 F (37.1 C)  TempSrc: Oral   Oral  SpO2: 97% 98% 93% 97%  Weight: 63.8 kg     Height:        Intake/Output from previous day:   Intake/Output Summary (Last 24 hours) at 12/26/2017 1136 Last data filed at 12/26/2017 0900 Gross per 24 hour  Intake 1605.35 ml  Output 2050 ml  Net -444.65 ml    Physical Exam: General: Alert, awake, oriented x3, in no acute distress.  HEENT: [place]/AT PEERL, EOMI Neck: Trachea midline,  no masses, no thyromegal,y no JVD, no carotid bruit OROPHARYNX:  Moist, No exudate/ erythema/lesions.  Heart: Regular rate and rhythm, without murmurs, rubs, gallops, PMI non-displaced, no heaves or thrills on palpation.  Lungs: Clear to auscultation, no wheezing or rhonchi noted. No increased vocal fremitus resonant to percussion  Abdomen: Soft, nontender, nondistended, positive bowel sounds, no masses no hepatosplenomegaly noted..  Neuro: No focal neurological deficits noted cranial nerves II through XII grossly intact. DTRs 2+ bilaterally upper and lower extremities. Strength 5 out of 5 in bilateral upper and lower extremities. Musculoskeletal: No warm swelling or erythema around joints, no spinal tenderness noted. Psychiatric: Patient alert and oriented x3, good insight and  cognition, good recent to remote recall. Lymph node survey: No cervical axillary or inguinal lymphadenopathy noted.  Lab Results:  Basic Metabolic Panel:    Component Value Date/Time   NA 142 12/26/2017 0521   NA 139 08/09/2017 0927   K 3.5 12/26/2017 0521   CL 108 12/26/2017 0521   CO2 26 12/26/2017 0521   BUN 5 (L) 12/26/2017 0521   BUN 5 (L) 08/09/2017 0927   CREATININE 0.44 (L) 12/26/2017 0521   GLUCOSE 102 (H) 12/26/2017 0521   CALCIUM 8.4 (L) 12/26/2017 0521   CBC:    Component Value Date/Time   WBC 13.0 (H) 12/26/2017 0521   HGB 6.2 (LL) 12/26/2017 0521   HGB 9.6 (L) 08/09/2017 0927   HCT 18.6 (L) 12/26/2017 0521   HCT 28.1 (L) 08/09/2017 0927   PLT 277 12/26/2017 0521   PLT 309 08/09/2017 0927   MCV 98.4 12/26/2017 0521   MCV 95 08/09/2017 0927   NEUTROABS 7.9 (H) 12/26/2017 0521   NEUTROABS 8.2 (H) 08/09/2017 0927   LYMPHSABS 2.7 12/26/2017 0521   LYMPHSABS 2.1 08/09/2017 0927   MONOABS 1.4 (H) 12/26/2017 0521   EOSABS 0.7 (H) 12/26/2017 0521   EOSABS 0.4 08/09/2017 0927   BASOSABS 0.1 12/26/2017 0521   BASOSABS 0.1 08/09/2017 0927    No results found for this or any previous visit (from the past 240 hour(s)).  Studies/Results: Dg Chest 2 View  Result Date: 12/24/2017 CLINICAL DATA:  Cough.  Sickle cell crisis.  Smoker. EXAM: CHEST - 2 VIEW COMPARISON:  12/17/2017 FINDINGS: Cardiac enlargement. Negative for heart failure. Patchy bibasilar airspace appears chronic and unchanged. No acute infiltrate or effusion. IMPRESSION: Mild bibasilar airspace disease similar to prior studies and consistent with scarring. No definite pneumonia. Electronically Signed   By: Marlan Palauharles  Clark M.D.   On: 12/24/2017 16:10    Medications: Scheduled Meds: . sodium chloride   Intravenous Once  . sodium chloride   Intravenous Once  . enoxaparin (LOVENOX) injection  40 mg Subcutaneous QHS  . folic acid  1 mg Oral Daily  . HYDROmorphone   Intravenous Q4H  . hydroxyurea  1,000 mg  Oral Daily  . ketorolac  30 mg Intravenous Q6H  . senna-docusate  1 tablet Oral BID   Continuous Infusions: . dextrose 5 % and 0.45% NaCl 100 mL/hr at 12/26/17 0625   PRN Meds:.diphenhydrAMINE **OR** [DISCONTINUED] diphenhydrAMINE, ibuprofen, oxycodone, polyethylene glycol, promethazine  Assessment/Plan: Principal Problem:   Sickle cell pain crisis (HCC) Active Problems:   Sickle cell anemia with pain (HCC)  1. Hb Sickle Cell Disease with crisis: Continue IVF D5 .45% Saline @ 100 mls/hour, continue weight based Dilaudid PCA, IV Toradol 30 mg Q 6 H, Monitor vitals very closely, Re-evaluate pain scale regularly, 2 L of Oxygen by [place].  2. Sickle Cell Anemia: Hb dropped again today, now 6.2. Anticipate further drop, therefore will transfuse with one unit of PRBC today to keep Hb above 7 or close to his baseline. 3. Chronic pain Syndrome: Continue home medication 4. Tobacco Abuse: Matthew Shannon was counseled on the dangers of tobacco use, and was advised to quit. Reviewed strategies to maximize success, including removing cigarettes and smoking materials from environment, stress management and support of family/friends.  Code Status: Full Code Family Communication: N/A Disposition Plan: Not yet ready for discharge  Matthew Shannon  If 7PM-7AM, please contact night-coverage.  12/26/2017, 11:36 AM  LOS: 2 days

## 2017-12-27 DIAGNOSIS — F17200 Nicotine dependence, unspecified, uncomplicated: Secondary | ICD-10-CM

## 2017-12-27 DIAGNOSIS — D5700 Hb-SS disease with crisis, unspecified: Principal | ICD-10-CM

## 2017-12-27 LAB — TYPE AND SCREEN
ABO/RH(D): O POS
ANTIBODY SCREEN: NEGATIVE
DONOR AG TYPE: NEGATIVE
Unit division: 0

## 2017-12-27 LAB — CBC
HEMATOCRIT: 21.4 % — AB (ref 39.0–52.0)
Hemoglobin: 7.1 g/dL — ABNORMAL LOW (ref 13.0–17.0)
MCH: 32.4 pg (ref 26.0–34.0)
MCHC: 33.2 g/dL (ref 30.0–36.0)
MCV: 97.7 fL (ref 80.0–100.0)
NRBC: 0.9 % — AB (ref 0.0–0.2)
PLATELETS: 270 10*3/uL (ref 150–400)
RBC: 2.19 MIL/uL — ABNORMAL LOW (ref 4.22–5.81)
RDW: 21.5 % — ABNORMAL HIGH (ref 11.5–15.5)
WBC: 14.3 10*3/uL — ABNORMAL HIGH (ref 4.0–10.5)

## 2017-12-27 LAB — BPAM RBC
Blood Product Expiration Date: 201912192359
ISSUE DATE / TIME: 201911171544
Unit Type and Rh: 5100

## 2017-12-27 MED ORDER — HYDROMORPHONE 1 MG/ML IV SOLN
INTRAVENOUS | Status: DC
  Administered 2017-12-27: 30 mg via INTRAVENOUS
  Administered 2017-12-27: 1.8 mg via INTRAVENOUS
  Administered 2017-12-27: 7.5 mg via INTRAVENOUS
  Administered 2017-12-27: 9 mg via INTRAVENOUS
  Administered 2017-12-28: 3.6 mg via INTRAVENOUS
  Administered 2017-12-28: 0.9 mg via INTRAVENOUS
  Administered 2017-12-28: 30 mg via INTRAVENOUS
  Administered 2017-12-28: 4.6 mg via INTRAVENOUS
  Administered 2017-12-28: 7 mg via INTRAVENOUS
  Administered 2017-12-28: 1.5 mg via INTRAVENOUS
  Administered 2017-12-29: 6.3 mg via INTRAVENOUS
  Administered 2017-12-29: 3 mg via INTRAVENOUS
  Administered 2017-12-29: 0.9 mg via INTRAVENOUS
  Filled 2017-12-27 (×2): qty 30

## 2017-12-27 NOTE — Progress Notes (Signed)
Subjective: [name] continues to complain of chest pains and pain to bilateral lower extremities.  Pain intensity is 8-9/10.  Patient states that he has been using PCA consistently with minimal relief. Patient denies headache, chest pain, shortness of breath, fatigue.  Patient is afebrile and maintaining oxygen saturation above 90% on room air.  Objective:  Vital signs in last 24 hours:  Vitals:   12/26/17 2333 12/27/17 0336 12/27/17 0503 12/27/17 0741  BP:   102/77   Pulse:   75   Resp: 12 15 18 12   Temp:   98.4 F (36.9 C)   TempSrc:   Oral   SpO2: 99% 92% 97% 99%  Weight:   66 kg   Height:        Intake/Output from previous day:   Intake/Output Summary (Last 24 hours) at 12/27/2017 0936 Last data filed at 12/26/2017 1901 Gross per 24 hour  Intake 1191.71 ml  Output 700 ml  Net 491.71 ml    Physical Exam: General: Alert, awake, oriented x3, in no acute distress.  HEENT: [place]/AT [place]ER[place], EOMI Neck: Trachea midline,  no masses, no thyromegal,y no JVD, no carotid bruit OROPHARYNX:  Moist, No exudate/ erythema/lesions.  Heart: Regular rate and rhythm, without murmurs, rubs, gallops, PMI non-displaced, no heaves or thrills on palpation.  Lungs: Clear to auscultation, no wheezing or rhonchi noted. No increased vocal fremitus resonant to percussion  Abdomen: Soft, nontender, nondistended, positive bowel sounds, no masses no hepatosplenomegaly noted..  Neuro: No focal neurological deficits noted cranial nerves II through XII grossly intact. DTRs 2+ bilaterally upper and lower extremities. Strength 5 out of 5 in bilateral upper and lower extremities. Musculoskeletal: No warm swelling or erythema around joints, no spinal tenderness noted. Psychiatric: Patient alert and oriented x3, good insight and cognition, good recent to remote recall. Lymph node survey: No cervical axillary or inguinal lymphadenopathy noted.  Lab Results:  Basic Metabolic Panel:    Component Value  Date/Time   NA 142 12/26/2017 0521   NA 139 08/09/2017 0927   K 3.5 12/26/2017 0521   CL 108 12/26/2017 0521   CO2 26 12/26/2017 0521   BUN 5 (L) 12/26/2017 0521   BUN 5 (L) 08/09/2017 0927   CREATININE 0.44 (L) 12/26/2017 0521   GLUCOSE 102 (H) 12/26/2017 0521   CALCIUM 8.4 (L) 12/26/2017 0521   CBC:    Component Value Date/Time   WBC 14.3 (H) 12/27/2017 0845   HGB 7.1 (L) 12/27/2017 0845   HGB 9.6 (L) 08/09/2017 0927   HCT 21.4 (L) 12/27/2017 0845   HCT 28.1 (L) 08/09/2017 0927   PLT 270 12/27/2017 0845   PLT 309 08/09/2017 0927   MCV 97.7 12/27/2017 0845   MCV 95 08/09/2017 0927   NEUTROABS 7.9 (H) 12/26/2017 0521   NEUTROABS 8.2 (H) 08/09/2017 0927   LYMPHSABS 2.7 12/26/2017 0521   LYMPHSABS 2.1 08/09/2017 0927   MONOABS 1.4 (H) 12/26/2017 0521   EOSABS 0.7 (H) 12/26/2017 0521   EOSABS 0.4 08/09/2017 0927   BASOSABS 0.1 12/26/2017 0521   BASOSABS 0.1 08/09/2017 0927    No results found for this or any previous visit (from the past 240 hour(s)).  Studies/Results: No results found.  Medications: Scheduled Meds: . sodium chloride   Intravenous Once  . enoxaparin (LOVENOX) injection  40 mg Subcutaneous QHS  . folic acid  1 mg Oral Daily  . HYDROmorphone   Intravenous Q4H  . hydroxyurea  1,000 mg Oral Daily  . ketorolac  30 mg  Intravenous Q6H  . senna-docusate  1 tablet Oral BID   Continuous Infusions: PRN Meds:.diphenhydrAMINE **OR** [DISCONTINUED] diphenhydrAMINE, ibuprofen, oxycodone, polyethylene glycol, promethazine  Assessment/Plan: Principal Problem:   Sickle cell pain crisis (HCC) Active Problems:   Sickle cell anemia with pain (HCC)  Sickle cell anemia with pain crisis: [facility], weaning PCA Dilaudid, continue IV Toradol 30 mg every 6 hours. Monitor vital signs closely Reevaluate pain scale regularly Maintain oxygen saturation above 90% on room air  Sickle cell anemia: Hemoglobin increased to 7.1 on today.  Patient received 2  units of packed red blood cells throughout admission.  We will continue to monitor closely CBC in a.m.  Chronic pain syndrome: We will continue oxycodone 30 mg every 6 hours  Tobacco dependence: Patient counseled at length.   Code Status: Full Code Family Communication: N/A Disposition Plan: Not yet ready for discharge     Meha Vidrine Rennis PettyMoore Emelly Wurtz  APRN, MSN, FNP-C [facility] [address]  [place], [place] [address] [phone]  If 7PM-7AM, please contact night-coverage.  12/27/2017, 9:36 AM  LOS: 3 days

## 2017-12-27 NOTE — Progress Notes (Signed)
Nutrition Brief Note  Patient identified on the Malnutrition Screening Tool (MST) Report  Patient with history of moderate malnutrition. Patient no longer meets criteria for malnutrition given increased weight, consuming 100% of meals that average between 1100-1500 kcal and ~30g protein each.   Wt Readings from Last 15 Encounters:  12/27/17 66 kg  12/17/17 61.7 kg  12/14/17 61.2 kg  12/06/17 59 kg  11/14/17 60.2 kg  11/07/17 57.1 kg  11/05/17 61.2 kg  11/05/17 55.8 kg  11/05/17 55.8 kg  11/03/17 56.2 kg  10/14/17 56.2 kg  10/13/17 61.2 kg  09/01/17 58.1 kg  08/18/17 59.9 kg  08/09/17 58.1 kg    Body mass index is 20.88 kg/m. Patient meets criteria for normal based on current BMI.   Current diet order is regular, patient is consuming approximately 100% of meals at this time. Labs and medications reviewed.   No nutrition interventions warranted at this time. If nutrition issues arise, please consult RD.   Matthew FrancoLindsey Alyshia Kernan, MS, RD, LDN [facility] Clinical Dietitian Pager: [phone] After Hours Pager: [phone]

## 2017-12-28 DIAGNOSIS — G894 Chronic pain syndrome: Secondary | ICD-10-CM

## 2017-12-28 DIAGNOSIS — R220 Localized swelling, mass and lump, head: Secondary | ICD-10-CM

## 2017-12-28 MED ORDER — SODIUM CHLORIDE 0.9 % IV SOLN
25.0000 mg | INTRAVENOUS | Status: DC | PRN
  Administered 2017-12-28 – 2017-12-29 (×3): 25 mg via INTRAVENOUS
  Filled 2017-12-28: qty 25
  Filled 2017-12-28 (×4): qty 0.5

## 2017-12-28 NOTE — Progress Notes (Signed)
Subjective: [name] continues to complain of chest pains and pain to bilateral lower extremities.  Pain intensity is 7/10.  Patient states that he has been using PCA consistently with minimal relief.  Patient is also complaining of swelling to lips overnight. Patient says that he is not allergic to any foods. He has a documented allergy to cephalosporins. He denies lip pain, wheezing, facial swelling, throat swelling, chest pain, shortness of breath, pruritis, or hives.   Patient is afebrile and maintaining oxygen saturation above 90% on room air.  Objective:  Vital signs in last 24 hours:  Vitals:   12/27/17 2358 12/28/17 0340 12/28/17 0601 12/28/17 0804  BP:   107/73   Pulse:   67   Resp: 14 14 (!) 9 14  Temp:   98.5 F (36.9 C)   TempSrc:   Oral   SpO2: 96% 98% 99% 99%  Weight:      Height:        Intake/Output from previous day:   Intake/Output Summary (Last 24 hours) at 12/28/2017 0947 Last data filed at 12/28/2017 0603 Gross per 24 hour  Intake 940 ml  Output 1150 ml  Net -210 ml    Physical Exam: General: Alert, awake, oriented x3, in no acute distress.  HEENT: [place]/AT PEERL, EOMI Neck: Trachea midline,  no masses, no thyromegal,y no JVD, no carotid bruit OROPHARYNX:  Moist, No exudate/ erythema/lesions.  Heart: Regular rate and rhythm, without murmurs, rubs, gallops, PMI non-displaced, no heaves or thrills on palpation.  Lungs: Clear to auscultation, no wheezing or rhonchi noted. No increased vocal fremitus resonant to percussion  Abdomen: Soft, nontender, nondistended, positive bowel sounds, no masses no hepatosplenomegaly noted..  Neuro: No focal neurological deficits noted cranial nerves II through XII grossly intact. DTRs 2+ bilaterally upper and lower extremities. Strength 5 out of 5 in bilateral upper and lower extremities. Musculoskeletal: No warm swelling or erythema around joints, no spinal tenderness noted. Psychiatric: Patient alert and oriented x3,  good insight and cognition, good recent to remote recall. Lymph node survey: No cervical axillary or inguinal lymphadenopathy noted.  Lab Results:  Basic Metabolic Panel:    Component Value Date/Time   NA 142 12/26/2017 0521   NA 139 08/09/2017 0927   K 3.5 12/26/2017 0521   CL 108 12/26/2017 0521   CO2 26 12/26/2017 0521   BUN 5 (L) 12/26/2017 0521   BUN 5 (L) 08/09/2017 0927   CREATININE 0.44 (L) 12/26/2017 0521   GLUCOSE 102 (H) 12/26/2017 0521   CALCIUM 8.4 (L) 12/26/2017 0521   CBC:    Component Value Date/Time   WBC 14.3 (H) 12/27/2017 0845   HGB 7.1 (L) 12/27/2017 0845   HGB 9.6 (L) 08/09/2017 0927   HCT 21.4 (L) 12/27/2017 0845   HCT 28.1 (L) 08/09/2017 0927   PLT 270 12/27/2017 0845   PLT 309 08/09/2017 0927   MCV 97.7 12/27/2017 0845   MCV 95 08/09/2017 0927   NEUTROABS 7.9 (H) 12/26/2017 0521   NEUTROABS 8.2 (H) 08/09/2017 0927   LYMPHSABS 2.7 12/26/2017 0521   LYMPHSABS 2.1 08/09/2017 0927   MONOABS 1.4 (H) 12/26/2017 0521   EOSABS 0.7 (H) 12/26/2017 0521   EOSABS 0.4 08/09/2017 0927   BASOSABS 0.1 12/26/2017 0521   BASOSABS 0.1 08/09/2017 0927    No results found for this or any previous visit (from the past 240 hour(s)).  Studies/Results: No results found.  Medications: Scheduled Meds: . sodium chloride   Intravenous Once  . enoxaparin (LOVENOX)  injection  40 mg Subcutaneous QHS  . folic acid  1 mg Oral Daily  . HYDROmorphone   Intravenous Q4H  . hydroxyurea  1,000 mg Oral Daily  . ketorolac  30 mg Intravenous Q6H  . senna-docusate  1 tablet Oral BID   Continuous Infusions: . diphenhydrAMINE     PRN Meds:.diphenhydrAMINE, diphenhydrAMINE **OR** [DISCONTINUED] diphenhydrAMINE, ibuprofen, oxycodone, polyethylene glycol, promethazine  Assessment/Plan: Principal Problem:   Sickle cell pain crisis (HCC) Active Problems:   Sickle cell anemia with pain (HCC)  Sickle cell anemia with pain crisis: Continue IV fluids at [facility], weaning PCA  Dilaudid,  Continue IV Toradol 30 mg every 6 hours. Monitor vital signs closely Reevaluate pain scale regularly Maintain oxygen saturation above 90% on room air  Sickle cell anemia: Hemoglobin increased to 7.1 on today.  Patient received 2 units of packed red blood cells throughout admission.   Follow CBC  Chronic pain syndrome: Continue oxycodone 30 mg every 6 hours as needed for moderate to severe chronic pain   Tobacco dependence: Patient counseled at length. Refuses nicotine patches.  Lip swelling:  Benadryl 25 mg IVPB times one    Code Status: Full Code Family Communication: N/A Disposition Plan: Not yet ready for discharge     Raza Bayless Rennis Petty  APRN, MSN, FNP-C [facility] Care [facility] [address] [place], [place] [address] [phone]  If 7PM-7AM, please contact night-coverage.  12/28/2017, 9:47 AM  LOS: 4 days

## 2017-12-29 DIAGNOSIS — D638 Anemia in other chronic diseases classified elsewhere: Secondary | ICD-10-CM

## 2017-12-29 NOTE — Progress Notes (Signed)
I returned from lunch as I received a call during that time that the patient needed to be discharged quickly as he "only had a ride for a limited time". When I arrived back to the department to discharge the patient, I arrived to his room to find he had taken out his own IV, turned off the pump and was gone.  I did attempt to call the patient however received no answer on either phone that was listed on his information on his chart.  Julianne HandlerLachina Hollis, NP notified of patient leaving without receiving his discharge instructions.

## 2017-12-29 NOTE — Discharge Summary (Signed)
Physician Discharge Summary  Matthew Shannon MVH:[id number] DOB: [birth year] DOA: 12/24/2017  PCP: [name], MD  Admit date: 12/24/2017  Discharge date: 12/29/2017  Discharge Diagnoses:  Principal Problem:   Sickle cell pain crisis (HCC) Active Problems:   Sickle cell anemia with pain (HCC)   Discharge Condition: Stable  Disposition:  Follow-up Information    [name], MD Follow up in 1 week(s).   Specialty:  Internal Medicine Why:  Repeat CBC in clinic Contact information: [address] [place] [address] [phone]          Pt is discharged home in good condition and is to follow up at [facility] health patient care center in 1 week to have labs evaluated. Matthew Shannon is instructed to increase activity slowly and balance with rest for the next few days, and use prescribed medication to complete treatment of pain  Diet: Regular Wt Readings from Last 3 Encounters:  12/27/17 66 kg  12/17/17 61.7 kg  12/14/17 61.2 kg    History of present illness:  Matthew Shannon, a 27 year old male with a medical history significant for sickle cell anemia, hemoglobin SS, chronic pain syndrome, history of polysubstance abuse, and anemia of chronic disease presented to ER with complaints of persistent pain mostly to upper and lower extremities over the past several days.  Patient was recently diagnosed with pneumonia and was discharged home on antibiotics.  Patient denies productive cough, shortness of breath, or fever.  He endorses some chest pain particularly around the ribs.  Patient also endorses some diarrhea.  He denies headache or visual symptoms or focal deficits.  ER course: In ER, despite multiple doses of Dilaudid and IV Toradol, pain persisted.  Labs show mild leukocytosis but patient is afebrile.  Chest x-ray shows no acute cardiopulmonary process.  Patient admitted and sickle cell pain crisis.  Hospital Course:  Patient was admitted and sickle cell pain  crisis and managed appropriately with Dilaudid PCA, oxycodone per home dose, and IV Toradol, as well as other adjunct therapies per sickle cell management protocols. Hemoglobin decreased to 6.3, which is below baseline.  Patient was transfused 2 units of packed red blood cells over course of admission.  Hemoglobin at discharge 7.1, which is above baseline.  Patient is scheduled to return to [facility] health patient care center in 1 week to repeat CBC. Patient has a history of chronic pain syndrome and will continue on oxycodone 30 mg every 4 hours for moderate to severe chronic pain.  Pain intensity at discharge is 5/10. Patient was admitted for sickle cell pain crisis and managed appropriately with IVF, IV Dilaudid via PCA and IV Toradol, as well as other adjunct therapies per sickle cell pain management protocols.  Patient was discharged home today in a hemodynamically stable condition.   Discharge Exam: Vitals:   12/29/17 0620 12/29/17 0901  BP: 114/77   Pulse: 72   Resp: 16 16  Temp: 97.9 F (36.6 C)   SpO2: 95% 96%   Vitals:   12/29/17 0013 12/29/17 0405 12/29/17 0620 12/29/17 0901  BP:   114/77   Pulse:   72   Resp: 16 (!) 9 16 16   Temp:   97.9 F (36.6 C)   TempSrc:   Oral   SpO2: 98% 96% 95% 96%  Weight:      Height:        General appearance : Awake, alert, not in any distress. Speech Clear. Not toxic looking HEENT: Atraumatic and Normocephalic, pupils  equally reactive to light and accomodation Neck: Supple, no JVD. No cervical lymphadenopathy.  Chest: Good air entry bilaterally, no added sounds  CVS: S1 S2 regular, no murmurs.  Abdomen: Bowel sounds present, Non tender and not distended with no gaurding, rigidity or rebound. Extremities: B/L Lower Ext shows no edema, both legs are warm to touch Neurology: Awake alert, and oriented X 3, CN II-XII intact, Non focal Skin: No Rash  Discharge Instructions  Discharge Instructions    Discharge patient   Complete by:  As  directed    Discharge disposition:  01-Home or Self Care   Discharge patient date:  12/29/2017     Allergies as of 12/29/2017      Reactions   Ceftriaxone Shortness Of Breath, Itching, Other (See Comments), Cough   Tolerates pip/tazo  (Patient unable to confirm this.)   Zosyn [piperacillin Sod-tazobactam So] Shortness Of Breath   Has patient had a PCN reaction causing immediate rash, facial/tongue/throat swelling, SOB or lightheadedness with hypotension: Unknown Has patient had a PCN reaction causing severe rash involving mucus membranes or skin necrosis: Unknown Has patient had a PCN reaction that required hospitalization: Unknown Has patient had a PCN reaction occurring within the last 10 years: Unknown If all of the above answers are "NO", then may proceed with Cephalosporin use.      Medication List    TAKE these medications   folic acid 1 MG tablet Commonly known as:  FOLVITE Take 1 tablet (1 mg total) by mouth daily.   hydroxyurea 500 MG capsule Commonly known as:  HYDREA Take 2 capsules (1,000 mg total) by mouth daily. May take with food to minimize GI side effects.   ibuprofen 600 MG tablet Commonly known as:  ADVIL,MOTRIN Take 1 tablet (600 mg total) by mouth every 8 (eight) hours as needed. What changed:  reasons to take this   oxycodone 30 MG immediate release tablet Commonly known as:  ROXICODONE Take 1 tablet (30 mg total) by mouth every 4 (four) hours as needed for up to 15 days for pain.   triamcinolone ointment 0.5 % Commonly known as:  KENALOG Apply 1 application topically 2 (two) times daily. What changed:    when to take this  reasons to take this       The results of significant diagnostics from this hospitalization (including imaging, microbiology, ancillary and laboratory) are listed below for reference.    Significant Diagnostic Studies: Dg Chest 2 View  Result Date: 12/24/2017 CLINICAL DATA:  Cough.  Sickle cell crisis.  Smoker. EXAM:  CHEST - 2 VIEW COMPARISON:  12/17/2017 FINDINGS: Cardiac enlargement. Negative for heart failure. Patchy bibasilar airspace appears chronic and unchanged. No acute infiltrate or effusion. IMPRESSION: Mild bibasilar airspace disease similar to prior studies and consistent with scarring. No definite pneumonia. Electronically Signed   By: Marlan Palau M.D.   On: 12/24/2017 16:10   Dg Chest 2 View  Result Date: 12/17/2017 CLINICAL DATA:  Cough and chest pain. History of sickle cell anemia and substance abuse. EXAM: CHEST - 2 VIEW COMPARISON:  Chest radiograph December 14, 2017 and priors FINDINGS: Stable cardiomegaly. Chronic interstitial changes and persistent faintly increased airspace opacities RIGHT lung base. No pleural effusion. No pneumothorax. Midthoracic dextroscoliosis. Surgical clips in the included right abdomen compatible with cholecystectomy. 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 IMPRESSION: Interstitial prominence RIGHT lower lobe suggesting atypical infection superimposed on chronic interstitial changes. Stable cardiomegaly. Electronically Signed   By: Awilda Metro M.D.   On:  12/17/2017 18:58   Dg Chest 2 View  Result Date: 12/14/2017 CLINICAL DATA:  Chest pain.  Sickle cell disease. EXAM: CHEST - 2 VIEW COMPARISON:  Two-view chest x-ray 11/11/2017 FINDINGS: Asymmetric ill-defined right-sided airspace disease has increased. There is some underlying asymmetry at baseline, this likely reflects superimposed disease. Heart size is normal. IMPRESSION: 1. Right-sided airspace disease superimposed on chronic disease of sickle cell. Electronically Signed   By: Marin Robertshristopher  Mattern M.D.   On: 12/14/2017 17:09    Microbiology: No results found for this or any previous visit (from the past 240 hour(s)).   Labs: Basic Metabolic Panel: Recent Labs  Lab 12/24/17 1539 12/25/17 0457 12/26/17 0521  NA 140 142 142  K 3.4* 3.5 3.5  CL 105 109 108  CO2 27 28 26   GLUCOSE 71 96 102*  BUN  5* 5* 5*  CREATININE 0.48* 0.45* 0.44*  CALCIUM 9.2 8.6* 8.4*   Liver Function Tests: Recent Labs  Lab 12/24/17 1539 12/26/17 0521  AST 34 45*  ALT 19 30  ALKPHOS 73 66  BILITOT 5.2* 5.3*  PROT 7.5 6.1*  ALBUMIN 4.2 3.5   No results for input(s): LIPASE, AMYLASE in the last 168 hours. No results for input(s): AMMONIA in the last 168 hours. CBC: Recent Labs  Lab 12/24/17 1539 12/25/17 0457 12/26/17 0521 12/27/17 0845  WBC 18.0* 16.4* 13.0* 14.3*  NEUTROABS 11.5* 9.9* 7.9*  --   HGB 7.6* 6.8* 6.2* 7.1*  HCT 22.2* 20.1* 18.6* 21.4*  MCV 97.4 97.6 98.4 97.7  PLT 327 281 277 270   Cardiac Enzymes: No results for input(s): CKTOTAL, CKMB, CKMBINDEX, TROPONINI in the last 168 hours. BNP: Invalid input(s): POCBNP CBG: No results for input(s): GLUCAP in the last 168 hours.  Time coordinating discharge: 50 minutes  Signed:  Nolon NationsLachina Moore Tynell Winchell  APRN, MSN, FNP-C Patient Care [facility] [address]  [place], [place] [id number] [phone]   [facility] 12/29/2017, 9:28 AM

## 2017-12-29 NOTE — Discharge Instructions (Signed)
Sickle Cell Anemia, Adult °Sickle cell anemia is a condition where your red blood cells are shaped like sickles. Red blood cells carry oxygen through the body. Sickle-shaped red blood cells do not live as long as normal red blood cells. They also clump together and block blood from flowing through the blood vessels. These things prevent the body from getting enough oxygen. Sickle cell anemia causes organ damage and pain. It also increases the risk of infection. °Follow these instructions at home: °· Drink enough fluid to keep your pee (urine) clear or pale yellow. Drink more in hot weather and during exercise. °· Do not smoke. Smoking lowers oxygen levels in the blood. °· Only take over-the-counter or prescription medicines as told by your doctor. °· Take antibiotic medicines as told by your doctor. Make sure you finish them even if you start to feel better. °· Take supplements as told by your doctor. °· Consider wearing a medical alert bracelet. This tells anyone caring for you in an emergency of your condition. °· When traveling, keep your medical information, doctors' names, and the medicines you take with you at all times. °· If you have a fever, do not take fever medicines right away. This could cover up a problem. Tell your doctor. °· Keep all follow-up visits with your doctor. Sickle cell anemia requires regular medical care. °Contact a doctor if: °You have a fever. °Get help right away if: °· You feel dizzy or faint. °· You have new belly (abdominal) pain, especially on the left side near the stomach area. °· You have a lasting, often uncomfortable and painful erection of the penis (priapism). If it is not treated right away, you will become unable to have sex (impotence). °· You have numbness in your arms or legs or you have a hard time moving them. °· You have a hard time talking. °· You have a fever or lasting symptoms for more than 2-3 days. °· You have a fever and your symptoms suddenly get  worse. °· You have signs or symptoms of infection. These include: °? Chills. °? Being more tired than normal (lethargy). °? Irritability. °? Poor eating. °? Throwing up (vomiting). °· You have pain that is not helped with medicine. °· You have shortness of breath. °· You have pain in your chest. °· You are coughing up pus-like or bloody mucus. °· You have a stiff neck. °· Your feet or hands swell or have pain. °· Your belly looks bloated. °· Your joints hurt. °This information is not intended to replace advice given to you by your health care provider. Make sure you discuss any questions you have with your health care provider. °Document Released: 11/16/2012 Document Revised: 07/04/2015 Document Reviewed: 09/07/2012 °Elsevier Interactive Patient Education © 2017 [employer] Inc[employer] ° °

## 2017-12-31 ENCOUNTER — Telehealth: Payer: Self-pay

## 2018-01-04 ENCOUNTER — Other Ambulatory Visit: Payer: Self-pay | Admitting: Internal Medicine

## 2018-01-04 ENCOUNTER — Telehealth: Payer: Self-pay

## 2018-01-04 MED ORDER — OXYCODONE HCL 30 MG PO TABS: 30.0000 mg | ORAL_TABLET | ORAL | 0 refills | Status: DC | PRN

## 2018-01-04 NOTE — Telephone Encounter (Signed)
Refilled

## 2018-01-05 ENCOUNTER — Telehealth (HOSPITAL_COMMUNITY): Payer: Self-pay

## 2018-01-05 ENCOUNTER — Telehealth: Payer: Self-pay

## 2018-01-05 DIAGNOSIS — D5700 Hb-SS disease with crisis, unspecified: Secondary | ICD-10-CM

## 2018-01-05 MED ORDER — FOLIC ACID 1 MG PO TABS: 1.0000 mg | ORAL_TABLET | Freq: Every day | ORAL | 11 refills | Status: AC

## 2018-01-05 NOTE — Telephone Encounter (Signed)
Refill for folic acid sent into pharmacy. Thanks!  

## 2018-01-05 NOTE — Telephone Encounter (Signed)
Patient called for triage to day hospital.  Complaint of pain in back and left side of stomach, rated as 7 out of 10.  Denies vomiting or diarrhea, positive for nausea.  Stated that he is out of oxycodone as he took his last dose yesterday.  Refill has been sent to the pharmacy electronically as well as the prior authorization electronically transmitted this morning (which could take up to 24 hours to process). Spoke with provider Julianne HandlerLaChina Hollis, FNP-C regarding request.  She advised for patient to medicate with ibuprofen and hydrate with at least 64 ounces and await medication refill Patient advised of same as well as anticipated wait time for prior authorization and he verbalized understanding.

## 2018-01-12 ENCOUNTER — Ambulatory Visit: Payer: Self-pay | Admitting: Family Medicine

## 2018-01-18 ENCOUNTER — Encounter: Payer: Self-pay | Admitting: Family Medicine

## 2018-01-18 ENCOUNTER — Ambulatory Visit (INDEPENDENT_AMBULATORY_CARE_PROVIDER_SITE_OTHER): Payer: Medicaid Other | Admitting: Family Medicine

## 2018-01-18 VITALS — BP 110/68 | HR 80 | Temp 98.0°F | Ht 70.0 in | Wt 136.0 lb

## 2018-01-18 DIAGNOSIS — D571 Sickle-cell disease without crisis: Secondary | ICD-10-CM

## 2018-01-18 DIAGNOSIS — Z765 Malingerer [conscious simulation]: Secondary | ICD-10-CM

## 2018-01-18 DIAGNOSIS — G894 Chronic pain syndrome: Secondary | ICD-10-CM | POA: Diagnosis not present

## 2018-01-18 DIAGNOSIS — D5700 Hb-SS disease with crisis, unspecified: Secondary | ICD-10-CM

## 2018-01-18 DIAGNOSIS — Z09 Encounter for follow-up examination after completed treatment for conditions other than malignant neoplasm: Secondary | ICD-10-CM

## 2018-01-18 LAB — POCT URINALYSIS DIP (MANUAL ENTRY)
Bilirubin, UA: NEGATIVE
Blood, UA: NEGATIVE
Glucose, UA: NEGATIVE mg/dL
Ketones, POC UA: NEGATIVE mg/dL
Leukocytes, UA: NEGATIVE
Nitrite, UA: NEGATIVE
Protein Ur, POC: NEGATIVE mg/dL
Spec Grav, UA: 1.015 (ref 1.010–1.025)
Urobilinogen, UA: 2 E.U./dL — AB
pH, UA: 7 (ref 5.0–8.0)

## 2018-01-18 MED ORDER — SODIUM CHLORIDE 0.9 % IV SOLN
20.00 | INTRAVENOUS | Status: DC
Start: ? — End: 2018-01-18

## 2018-01-18 MED ORDER — OXYCODONE HCL 30 MG PO TABS: 30.0000 mg | ORAL_TABLET | ORAL | 0 refills | Status: DC | PRN

## 2018-01-18 NOTE — Progress Notes (Signed)
Sickle Cell Anemia Follow Up  Subjective:    Patient ID: Matthew Shannon, male    DOB: 03/16/1990, 27 y.o.   MRN: [id number]  Chief Complaint  Patient presents with  . Follow-up    sickle cell    HPI  [name] is a 27 year old male with a past medical history of Sickle Cell Anemia, Tobacco Dependence, Substance Abuse, Cocaine Abuse, and Chronic Pain Syndrome.   Current Status: Since his last office visit, he is doing well with no complaints. He states that he has pain in his chest and back. He rates his pain today at 7/10. He has not has a hospital visit for Sickle Cell Crisis since 01/16/2018 where he was treated and discharged the same day. He is currently taking all medications as prescribed and staying well hydrated. He reports occasional dizziness and headaches. c/o mild chest pain and back pain today. She denies fevers, chills, fatigue, recent infections, weight loss, and night sweats. She has not had any headaches, visual changes, dizziness, and falls. No chest pain, heart palpitations, cough and shortness of breath reported. No reports of GI problems such as nausea, vomiting, diarrhea, and constipation. She has no reports of blood in stools, dysuria and hematuria. No depression or anxiety reported. She denies pain today.   Review of Systems  Constitutional: Negative.   HENT: Negative.   Eyes: Negative.   Respiratory: Negative.   Cardiovascular: Negative.        Mild chest discomfort  Gastrointestinal: Negative.   Endocrine: Negative.   Genitourinary: Negative.   Musculoskeletal: Positive for back pain.  Skin: Negative.   Allergic/Immunologic: Negative.   Hematological: Negative.   Psychiatric/Behavioral: Negative.    Objective:   Physical Exam  Constitutional: He is oriented to person, place, and time. He appears well-developed and well-nourished.  HENT:  Head: Normocephalic and atraumatic.  Right Ear: External ear normal.  Eyes: Pupils are equal, round, and reactive to  light. Conjunctivae and EOM are normal.  Neck: Normal range of motion. Neck supple.  Cardiovascular: Normal rate, regular rhythm, normal heart sounds and intact distal pulses.  Pulmonary/Chest: Effort normal and breath sounds normal.  Abdominal: Soft. Bowel sounds are normal.  Musculoskeletal: Normal range of motion.  Neurological: He is alert and oriented to person, place, and time.  Skin: Skin is warm and dry.  Psychiatric: He has a normal mood and affect. His behavior is normal. Judgment and thought content normal.  Nursing note and vitals reviewed.  Assessment & Plan:   1. Hb-SS disease without crisis [facility](HCC) He is doing well today. He will continue to take pain medications as prescribed; will continue to avoid extreme heat and cold; will continue to eat a healthy diet and drink at least 64 ounces of water daily; continue stool softener as needed; will avoid colds and flu; will continue to get plenty of sleep and rest; will continue to avoid high stressful situations and remain infection free; will continue Folic Acid 1 mg daily to avoid sickle cell crisis.   - oxycodone (ROXICODONE) 30 MG immediate release tablet; Take 1 tablet (30 mg total) by mouth every 4 (four) hours as needed for up to 15 days for pain. Patient technically not due for refill until 01/20/2018, but has had increased sickle cell pain for the past few days and is out of medication. Please refill Rx today. Thank you.  Dispense: 90 tablet; Refill: 0  2. Sickle cell anemia with pain (HCC) - oxycodone (ROXICODONE) 30 MG immediate release  tablet; Take 1 tablet (30 mg total) by mouth every 4 (four) hours as needed for up to 15 days for pain. Patient technically not due for refill until 01/20/2018, but has had increased sickle cell pain for the past few days and is out of medication. Please refill Rx today. Thank you.  Dispense: 90 tablet; Refill: 0  3. Chronic pain syndrome - oxycodone (ROXICODONE) 30 MG immediate release tablet;  Take 1 tablet (30 mg total) by mouth every 4 (four) hours as needed for up to 15 days for pain. Patient technically not due for refill until 01/20/2018, but has had increased sickle cell pain for the past few days and is out of medication. Please refill Rx today. Thank you.  Dispense: 90 tablet; Refill: 0  4. Drug-seeking behavior - Drug Screen, Urine  5. Follow up He will follow up with [name].  - POCT urinalysis dipstick  Meds ordered this encounter  Medications  . oxycodone (ROXICODONE) 30 MG immediate release tablet    Sig: Take 1 tablet (30 mg total) by mouth every 4 (four) hours as needed for up to 15 days for pain. Patient technically not due for refill until 01/20/2018, but has had increased sickle cell pain for the past few days and is out of medication. Please refill Rx today. Thank you.    Dispense:  90 tablet    Refill:  0    Order Specific Question:   Supervising Provider    Answer:   [name] [[id number]]   Raliegh Ip,  MSN, FNP-C Patient Care [facility] [address] [place], [place] [address] [phone]

## 2018-01-19 LAB — DRUG SCREEN, URINE
Amphetamines, Urine: NEGATIVE ng/mL
Barbiturate screen, urine: NEGATIVE ng/mL
Benzodiazepine Quant, Ur: NEGATIVE ng/mL
Cannabinoid Quant, Ur: NEGATIVE ng/mL
Cocaine (Metab.): NEGATIVE ng/mL
Opiate Quant, Ur: POSITIVE ng/mL — AB
PCP Quant, Ur: NEGATIVE ng/mL

## 2018-01-27 ENCOUNTER — Telehealth: Payer: Self-pay

## 2018-01-27 DIAGNOSIS — D571 Sickle-cell disease without crisis: Secondary | ICD-10-CM

## 2018-01-27 DIAGNOSIS — D5700 Hb-SS disease with crisis, unspecified: Secondary | ICD-10-CM

## 2018-01-27 DIAGNOSIS — G894 Chronic pain syndrome: Secondary | ICD-10-CM

## 2018-01-28 MED ORDER — OXYCODONE HCL 30 MG PO TABS: 30.0000 mg | ORAL_TABLET | ORAL | 0 refills | Status: DC | PRN

## 2018-01-28 NOTE — Telephone Encounter (Signed)
Reviewed [place] Substance Reporting system prior to prescribing opiate medications. No inconsistencies noted.   

## 2018-01-31 ENCOUNTER — Other Ambulatory Visit: Payer: Self-pay | Admitting: Family Medicine

## 2018-01-31 ENCOUNTER — Telehealth (HOSPITAL_COMMUNITY): Payer: Self-pay | Admitting: *Deleted

## 2018-01-31 DIAGNOSIS — D5700 Hb-SS disease with crisis, unspecified: Secondary | ICD-10-CM

## 2018-01-31 DIAGNOSIS — D571 Sickle-cell disease without crisis: Secondary | ICD-10-CM

## 2018-01-31 DIAGNOSIS — G894 Chronic pain syndrome: Secondary | ICD-10-CM

## 2018-01-31 MED ORDER — OXYCODONE HCL 30 MG PO TABS: 30.0000 mg | ORAL_TABLET | ORAL | 0 refills | Status: DC | PRN

## 2018-01-31 MED FILL — oxyCODONE HCL 30 MG TABS: 30 | 15 days supply | Qty: 90 | Fill #0

## 2018-01-31 NOTE — Telephone Encounter (Signed)
Patient called reporting pain in back rated 8/10. Patient reports being out of prescribed pain medications. Patient would rather get pain medications refilled and not come to the day hospital. Contacted patient's primary provider, Debby BudAndre FNP. Provider will check to see if medications can be refilled and we will contact patient with update.

## 2018-01-31 NOTE — Progress Notes (Signed)
Patient to pick up printed prescription to take to pharmacy that has his medication.

## 2018-02-14 ENCOUNTER — Telehealth: Payer: Self-pay

## 2018-02-14 ENCOUNTER — Telehealth (HOSPITAL_COMMUNITY): Payer: Self-pay | Admitting: General Practice

## 2018-02-14 NOTE — Telephone Encounter (Signed)
Patient has an appointment with you on 03/01/2018. Do you want to fill?

## 2018-02-14 NOTE — Telephone Encounter (Signed)
Patient called, complained of pain in the chest ("usual') and back that he rated as 8/10. Denied fever, diarrhea, priapism and abdominal pain. Endorsed active nausea and vomitting.  Last took Oxycodone 30 mg yesterday. Provider notified; per provider, we are "full' and also patient should go to the ER because of the nausea and vomiting. Patient notified, verbalized understanding.

## 2018-02-15 ENCOUNTER — Telehealth (HOSPITAL_COMMUNITY): Payer: Self-pay | Admitting: *Deleted

## 2018-02-15 NOTE — Telephone Encounter (Signed)
Patient called requesting to come to the day hospital. Patient reports pain in back and stomach rated 8/10. Denies fever, chest pain, nausea, vomiting, diarrhea and priapism. Last in the ER yesterday. Reports being out of prescription pain medications but being able to fill prescription today. When asked about transportation at discharge, patient admits to not having transportation and says that he would have to drive himself. [place], FNP notified. Patient advised to call and request a refill on his prescription pain medication. Since patient has no one to transport him at discharge, patient is inappropriate to come to the day hospital.  Patient advised and expresses an understanding.

## 2018-02-17 ENCOUNTER — Encounter (HOSPITAL_BASED_OUTPATIENT_CLINIC_OR_DEPARTMENT_OTHER): Payer: Self-pay

## 2018-02-17 ENCOUNTER — Emergency Department (HOSPITAL_BASED_OUTPATIENT_CLINIC_OR_DEPARTMENT_OTHER)
Admission: EM | Admit: 2018-02-17 | Discharge: 2018-02-17 | Disposition: A | Discharge status: Home / Self Care | Payer: Medicaid Other | Attending: Emergency Medicine | Admitting: Emergency Medicine

## 2018-02-17 DIAGNOSIS — Z765 Malingerer [conscious simulation]: Secondary | ICD-10-CM | POA: Diagnosis present

## 2018-02-17 DIAGNOSIS — D571 Sickle-cell disease without crisis: Secondary | ICD-10-CM | POA: Insufficient documentation

## 2018-02-17 DIAGNOSIS — F1910 Other psychoactive substance abuse, uncomplicated: Secondary | ICD-10-CM | POA: Diagnosis not present

## 2018-02-17 DIAGNOSIS — Z79899 Other long term (current) drug therapy: Secondary | ICD-10-CM | POA: Diagnosis not present

## 2018-02-17 DIAGNOSIS — F17210 Nicotine dependence, cigarettes, uncomplicated: Secondary | ICD-10-CM | POA: Diagnosis not present

## 2018-02-17 MED ORDER — HYDROMORPHONE HCL 1 MG/ML IJ SOLN
1.00 | INTRAMUSCULAR | Status: DC
Start: ? — End: 2018-02-17

## 2018-02-17 MED ORDER — PROMETHAZINE HCL 25 MG/ML IJ SOLN: 50.0000 mg | Freq: Once | INTRAMUSCULAR | Status: DC

## 2018-02-17 MED ORDER — ACETAMINOPHEN 650 MG RE SUPP
650.00 | RECTAL | Status: DC
Start: ? — End: 2018-02-17

## 2018-02-17 MED ORDER — HYDROXYUREA 500 MG PO CAPS
1000.00 | ORAL_CAPSULE | ORAL | Status: DC
Start: 2018-02-16 — End: 2018-02-17

## 2018-02-17 MED ORDER — ENOXAPARIN SODIUM 40 MG/0.4ML ~~LOC~~ SOLN
40.00 | SUBCUTANEOUS | Status: DC
Start: 2018-02-16 — End: 2018-02-17

## 2018-02-17 MED ORDER — DIPHENHYDRAMINE HCL 25 MG PO CAPS
25.00 | ORAL_CAPSULE | ORAL | Status: DC
Start: ? — End: 2018-02-17

## 2018-02-17 MED ORDER — NITROGLYCERIN 0.4 MG SL SUBL
.40 | SUBLINGUAL_TABLET | SUBLINGUAL | Status: DC
Start: ? — End: 2018-02-17

## 2018-02-17 MED ORDER — ALUM & MAG HYDROXIDE-SIMETH 200-200-20 MG/5ML PO SUSP
30.00 | ORAL | Status: DC
Start: ? — End: 2018-02-17

## 2018-02-17 MED ORDER — FOLIC ACID 1 MG PO TABS
1.00 | ORAL_TABLET | ORAL | Status: DC
Start: 2018-02-16 — End: 2018-02-17

## 2018-02-17 MED ORDER — HYDROMORPHONE HCL 1 MG/ML IJ SOLN
0.50 | INTRAMUSCULAR | Status: DC
Start: ? — End: 2018-02-17

## 2018-02-17 MED ORDER — GENERIC EXTERNAL MEDICATION
10.00 | Status: DC
Start: ? — End: 2018-02-17

## 2018-02-17 MED ORDER — NICOTINE 14 MG/24HR TD PT24
1.00 | MEDICATED_PATCH | TRANSDERMAL | Status: DC
Start: 2018-02-16 — End: 2018-02-17

## 2018-02-17 MED ORDER — ALBUTEROL SULFATE (2.5 MG/3ML) 0.083% IN NEBU
2.50 | INHALATION_SOLUTION | RESPIRATORY_TRACT | Status: DC
Start: ? — End: 2018-02-17

## 2018-02-17 MED ORDER — SODIUM CHLORIDE 0.9 % IV SOLN
10.00 | INTRAVENOUS | Status: DC
Start: ? — End: 2018-02-17

## 2018-02-17 MED ORDER — MORPHINE SULFATE (PF) 2 MG/ML IV SOLN
2.00 | INTRAVENOUS | Status: DC
Start: ? — End: 2018-02-17

## 2018-02-17 MED ORDER — GENERIC EXTERNAL MEDICATION
650.00 | Status: DC
Start: ? — End: 2018-02-17

## 2018-02-17 MED ORDER — ACETAMINOPHEN 325 MG PO TABS
650.00 | ORAL_TABLET | ORAL | Status: DC
Start: ? — End: 2018-02-17

## 2018-02-17 MED ORDER — PROMETHAZINE HCL 25 MG/ML IJ SOLN
12.50 | INTRAMUSCULAR | Status: DC
Start: ? — End: 2018-02-17

## 2018-02-17 MED ORDER — PROMETHAZINE HCL 25 MG/ML IJ SOLN
50.0000 mg | Freq: Once | INTRAMUSCULAR | Status: AC
  Administered 2018-02-17: 50 mg via INTRAMUSCULAR
  Filled 2018-02-17: qty 2

## 2018-02-17 MED ORDER — PANTOPRAZOLE SODIUM 40 MG IV SOLR
40.00 | INTRAVENOUS | Status: DC
Start: 2018-02-15 — End: 2018-02-17

## 2018-02-17 MED ORDER — ONDANSETRON HCL 4 MG/2ML IJ SOLN
4.00 | INTRAMUSCULAR | Status: DC
Start: ? — End: 2018-02-17

## 2018-02-17 MED ORDER — DEXTROSE-NACL 5-0.9 % IV SOLN
75.00 | INTRAVENOUS | Status: DC
Start: ? — End: 2018-02-17
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
## 2018-02-17 NOTE — ED Notes (Signed)
PT states understanding of care given, follow up care. PT ambulated from ED to car with a steady gait.  

## 2018-02-17 NOTE — ED Triage Notes (Signed)
Pt states he feels that he is having a crisis and is having abdominal and back pain at this time.

## 2018-02-17 NOTE — ED Notes (Signed)
Pt was given gingerale at this time.

## 2018-02-17 NOTE — ED Notes (Signed)
Pt was chewing when RN walked into the room to given injection.

## 2018-02-18 ENCOUNTER — Other Ambulatory Visit: Payer: Self-pay | Admitting: Internal Medicine

## 2018-02-18 DIAGNOSIS — G894 Chronic pain syndrome: Secondary | ICD-10-CM

## 2018-02-18 DIAGNOSIS — D5700 Hb-SS disease with crisis, unspecified: Secondary | ICD-10-CM

## 2018-02-18 DIAGNOSIS — D571 Sickle-cell disease without crisis: Secondary | ICD-10-CM

## 2018-02-18 MED ORDER — OXYCODONE HCL 30 MG PO TABS: 30.0000 mg | ORAL_TABLET | ORAL | 0 refills | Status: DC | PRN

## 2018-02-18 MED ORDER — HYDROXYUREA 500 MG PO CAPS: 1000.0000 mg | ORAL_CAPSULE | Freq: Every day | ORAL | 2 refills | Status: AC

## 2018-02-18 NOTE — Telephone Encounter (Signed)
Refilled

## 2018-02-26 ENCOUNTER — Encounter (HOSPITAL_BASED_OUTPATIENT_CLINIC_OR_DEPARTMENT_OTHER): Payer: Self-pay | Admitting: Emergency Medicine

## 2018-02-26 ENCOUNTER — Other Ambulatory Visit: Payer: Self-pay

## 2018-02-26 ENCOUNTER — Emergency Department (HOSPITAL_BASED_OUTPATIENT_CLINIC_OR_DEPARTMENT_OTHER)
Admission: EM | Admit: 2018-02-26 | Discharge: 2018-02-26 | Disposition: A | Discharge status: Home / Self Care | Payer: Medicaid Other | Attending: Emergency Medicine | Admitting: Emergency Medicine

## 2018-02-26 DIAGNOSIS — Z79899 Other long term (current) drug therapy: Secondary | ICD-10-CM | POA: Diagnosis not present

## 2018-02-26 DIAGNOSIS — F17210 Nicotine dependence, cigarettes, uncomplicated: Secondary | ICD-10-CM | POA: Diagnosis not present

## 2018-02-26 DIAGNOSIS — R109 Unspecified abdominal pain: Secondary | ICD-10-CM | POA: Diagnosis present

## 2018-02-26 DIAGNOSIS — G8929 Other chronic pain: Secondary | ICD-10-CM | POA: Insufficient documentation

## 2018-02-26 MED ORDER — LACTATED RINGERS IV BOLUS
1000.0000 mL | Freq: Once | INTRAVENOUS | Status: AC
  Administered 2018-02-26: 1000 mL via INTRAVENOUS

## 2018-02-26 MED ORDER — GENERIC EXTERNAL MEDICATION
10.00 | Status: DC
Start: ? — End: 2018-02-26

## 2018-02-26 MED ORDER — OXYCODONE HCL 5 MG PO TABS
30.0000 mg | ORAL_TABLET | Freq: Once | ORAL | Status: AC
  Administered 2018-02-26: 30 mg via ORAL
  Filled 2018-02-26: qty 6

## 2018-02-26 MED ORDER — SODIUM CHLORIDE 0.9 % IV SOLN
10.00 | INTRAVENOUS | Status: DC
Start: ? — End: 2018-02-26

## 2018-02-26 MED ORDER — PROMETHAZINE HCL 25 MG/ML IJ SOLN
25.0000 mg | Freq: Once | INTRAMUSCULAR | Status: AC
  Administered 2018-02-26: 25 mg via INTRAVENOUS
  Filled 2018-02-26: qty 1

## 2018-02-26 MED ORDER — ACETAMINOPHEN 500 MG PO TABS
1000.00 | ORAL_TABLET | ORAL | Status: DC
Start: ? — End: 2018-02-26
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
## 2018-02-26 NOTE — ED Triage Notes (Signed)
Pt c/o back and abdominal pain x 4 days. Pt has history of sickle cell.

## 2018-02-26 NOTE — Discharge Instructions (Signed)
Sure you drink plenty of fluids and take your nausea medicine and your pain medicine.  Follow-up with your doctor as planned on Tuesday

## 2018-02-26 NOTE — ED Notes (Signed)
Pt requesting voucher, spoke with charge, none available.

## 2018-02-26 NOTE — ED Notes (Signed)
Pt states he has been having abdominal pain and back pain x 4 days. Pt denies diarrhea, c/o nausea, vomiting. Pt took 30 mg oxycodone at 12 today with little relief. Pt denies taking any nausea medication. Last emesis per pt at 1500 today.

## 2018-02-26 NOTE — ED Notes (Signed)
Pt asked to be discharged, states he just had a death in the family. RN and MD informed.

## 2018-02-26 NOTE — ED Notes (Signed)
Unsuccesful IV attempt, Left hand. Rn Janna Arch in room for attempt

## 2018-02-26 NOTE — ED Notes (Signed)
ED Provider at bedside. 

## 2018-03-01 ENCOUNTER — Encounter: Payer: Self-pay | Admitting: Internal Medicine

## 2018-03-01 ENCOUNTER — Ambulatory Visit (INDEPENDENT_AMBULATORY_CARE_PROVIDER_SITE_OTHER): Payer: Medicaid Other | Admitting: Internal Medicine

## 2018-03-01 VITALS — BP 111/67 | HR 82 | Temp 98.9°F | Resp 16 | Ht 70.0 in | Wt 137.0 lb

## 2018-03-01 DIAGNOSIS — D5700 Hb-SS disease with crisis, unspecified: Secondary | ICD-10-CM

## 2018-03-01 DIAGNOSIS — Z720 Tobacco use: Secondary | ICD-10-CM | POA: Diagnosis not present

## 2018-03-01 DIAGNOSIS — G894 Chronic pain syndrome: Secondary | ICD-10-CM

## 2018-03-01 LAB — POCT URINALYSIS DIPSTICK
Bilirubin, UA: NEGATIVE
Glucose, UA: NEGATIVE
Ketones, UA: NEGATIVE
LEUKOCYTES UA: NEGATIVE
NITRITE UA: NEGATIVE
PH UA: 7 (ref 5.0–8.0)
PROTEIN UA: NEGATIVE
RBC UA: NEGATIVE
Spec Grav, UA: 1.015 (ref 1.010–1.025)
UROBILINOGEN UA: 4 U/dL — AB

## 2018-03-01 MED ORDER — IBUPROFEN 600 MG PO TABS: 600.0000 mg | ORAL_TABLET | Freq: Three times a day (TID) | ORAL | 2 refills | Status: DC | PRN

## 2018-03-01 MED ORDER — OXYCODONE HCL 30 MG PO TABS: 30.0000 mg | ORAL_TABLET | ORAL | 0 refills | Status: DC | PRN

## 2018-03-01 MED FILL — IBUPROFEN 600 MG TABLET: 600 | 20 days supply | Qty: 60 | Fill #0

## 2018-03-01 MED FILL — oxyCODONE HCL 30 MG TABS: 30 | 15 days supply | Qty: 90 | Fill #0

## 2018-03-01 NOTE — Patient Instructions (Signed)
Sickle Cell Anemia, Adult  Sickle cell anemia is a condition in which red blood cells have an abnormal "sickle" shape. Red blood cells carry oxygen through the body. Sickle-shaped red blood cells do not live as long as normal red blood cells. They also clump together and block blood from flowing through the blood vessels. This condition prevents the body from getting enough oxygen. Sickle cell anemia causes organ damage and pain. It also increases the risk of infection. What are the causes? This condition is caused by a gene that is passed from parent to child (inherited). Receiving two copies of the gene causes the disease. Receiving one copy causes the "trait," which means that symptoms are milder or not present. What increases the risk? This condition is more likely to develop if your ancestors were from [place], the Mediterranean, South or [place], the [place], [place], or the Middle [place]. What are the signs or symptoms? Symptoms of this condition include:  Episodes of pain (crises), especially in the hands and feet, joints, back, chest, or abdomen. The pain can be triggered by: ? An illness, especially if there is dehydration. ? Doing an activity with great effort (overexertion). ? Exposure to extreme temperature changes. ? High altitude.  Fatigue.  Shortness of breath or difficulty breathing.  Dizziness.  Pale skin or yellowed skin (jaundice).  Frequent bacterial infections.  Pain and swelling in the hands and feet (hand-food syndrome).  Prolonged, painful erection of the penis (priapism).  Acute chest syndrome. Symptoms of this include: ? Chest pain. ? Fever. ? Cough. ? Fast breathing.  Stroke.  Decreased activity.  Loss of appetite.  Change in behavior.  Headaches.  Seizures.  Vision changes.  Skin ulcers.  Heart disease.  High blood pressure.  Gallstones.  Liver and kidney problems. How is this diagnosed? This condition is diagnosed with  blood tests that check for the gene that causes this condition. How is this treated? There is no cure for most cases of this condition. Treatment focuses on managing your symptoms and preventing complications of the disease. Your health care provider will work with you to identify the best treatment options for you based on an assessment of your condition. Treatment may include:  Medicines, including: ? Pain medicines. ? Antibiotic medicines for infection. ? Medicines to increase the production of a protein in red blood cells that helps carry oxygen in the body (hemoglobin).  Fluids to treat pain and swelling.  Oxygen to treat acute chest syndrome.  Blood transfusions to treat symptoms such as fatigue, stroke, and acute chest syndrome.  Massage and physical therapy for pain.  Regular tests to monitor your condition, such as blood tests, X-rays, CT scans, MRI scans, ultrasounds, and lung function tests. These should be done every 3-12 months, depending on your age.  Hematopoietic stem cell transplant. This is a procedure to replace abnormal stem cells with healthy stem cells from a donor's bone marrow. Stem cells are cells that can develop into blood cells, and bone marrow is the spongy tissue inside the bones. Follow these instructions at home: Medicines  Take over-the-counter and prescription medicines only as told by your health care provider.  If you were prescribed an antibiotic medicine, take it as told by your health care provider. Do not stop taking the antibiotic even if you start to feel better.  If you develop a fever, do not take medicines to reduce the fever right away. This could cover up another problem. Notify your health care provider. Managing   pain, stiffness, and swelling  Try these methods to help ease your pain: ? Using a heating pad. ? Taking a warm bath. ? Distracting yourself, such as by watching TV. Eating and drinking  Drink enough fluid to keep your urine  clear or pale yellow. Drink more in hot weather and during exercise.  Limit or avoid drinking alcohol.  Eat a balanced and nutritious diet. Eat plenty of fruits, vegetables, whole grains, and lean protein.  Take vitamins and supplements as directed by your health care provider. Traveling  When traveling, keep these with you: ? Your medical information. ? The names of your health care providers. ? Your medicines.  If you have to travel by air, ask about precautions you should take. Activity  Get plenty of rest.  Avoid activities that will lower your oxygen levels, such as exercising vigorously. General instructions  Do not use any products that contain nicotine or tobacco, such as cigarettes and e-cigarettes. They lower blood oxygen levels. If you need help quitting, ask your health care provider.  Consider wearing a medical alert bracelet.  Avoid high altitudes.  Avoid extreme temperatures and extreme temperature changes.  Keep all follow-up visits as told by your health care provider. This is important. Contact a health care provider if:  You develop joint pain.  Your feet or hands swell or have pain.  You have fatigue. Get help right away if:  You have symptoms of infection. These include: ? Fever. ? Chills. ? Extreme tiredness. ? Irritability. ? Poor eating. ? Vomiting.  You feel dizzy or faint.  You have new abdominal pain, especially on the left side near the stomach area.  You develop priapism.  You have numbness in your arms or legs or have trouble moving them.  You have trouble talking.  You develop pain that cannot be controlled with medicine.  You become short of breath.  You have rapid breathing.  You have a persistent cough.  You have pain in your chest.  You develop a severe headache or stiff neck.  You feel bloated without eating or after eating a small amount of food.  Your skin is pale.  You suddenly lose  vision. Summary  Sickle cell anemia is a condition in which red blood cells have an abnormal "sickle" shape. This disease can cause organ damage and chronic pain, and it can raise your risk of infection.  Sickle cell anemia is a genetic disorder.  Treatment focuses on managing your symptoms and preventing complications of the disease.  Get medical help right away if you have any signs of infection, such as a fever. This information is not intended to replace advice given to you by your health care provider. Make sure you discuss any questions you have with your health care provider. Document Released: 05/06/2005 Document Revised: 03/03/2016 Document Reviewed: 03/03/2016 Elsevier Interactive Patient Education  2019 [employer]  

## 2018-03-01 NOTE — Progress Notes (Signed)
Matthew HarrisonBobby Avilla, is a 28 y.o. male  ZOX:[id number]:[id number]  [id number]  DOB - 12/23/1990  Chief Complaint  Patient presents with  . Sickle Cell Anemia  . Medication Refill    oxycodone and ibuprofen       Subjective:   Matthew Shannon is a 28 y.o. male with medical history significant for sickle cell disease, chronic pain syndrome, polysubstance use disorder, drug-seeking behavior and tobacco dependence who presents here today for a routine follow up visit and medication refills.  Patient claims adherence with his medications.  He has no new complaints today.  Pain is at baseline.  He denies any dizziness, headache, chest pain, shortness of breath, nausea, vomiting, diarrhea or constipation.  He denies any suicidal ideations or thoughts.  He continues to smoke cigarettes heavily despite repeated counseling.  Problem  Pneumonia (Resolved)  Abscess of Palate (Resolved)  Left Facial Swelling (Resolved)  Infected Dental Caries (Resolved)  Sickle Cell Disease With Crisis (Hcc) (Resolved)  Microcytic Anemia (Resolved)  Sickle-Cell Disease With Pain (Hcc) (Resolved)  Anemia of Chronic Disease (Resolved)  Leukocytosis (Resolved)  Sickle Cell Anemia With Pain (Hcc) (Resolved)  Dental Infection (Resolved)  Chest Pain (Resolved)  Sickle Cell Crisis (Hcc) (Resolved)  Tachycardia (Resolved)  Community Acquired Pneumonia (Resolved)  Cap (Community Acquired Pneumonia) (Resolved)    ALLERGIES: Allergies  Allergen Reactions  . Ceftriaxone Shortness Of Breath, Itching, Other (See Comments) and Cough    Tolerates pip/tazo  (Patient unable to confirm this.)   . Zosyn [Piperacillin Sod-Tazobactam So] Shortness Of Breath    Has patient had a PCN reaction causing immediate rash, facial/tongue/throat swelling, SOB or lightheadedness with hypotension: Unknown Has patient had a PCN reaction causing severe rash involving mucus membranes or skin necrosis: Unknown Has patient had a PCN reaction that required  hospitalization: Unknown Has patient had a PCN reaction occurring within the last 10 years: Unknown If all of the above answers are "NO", then may proceed with Cephalosporin use.     PAST MEDICAL HISTORY: Past Medical History:  Diagnosis Date  . Chronic pain syndrome   . Cocaine abuse (HCC)   . Drug-seeking behavior   . Sickle cell anemia (HCC)   . Substance abuse (HCC)   . Tobacco dependence     MEDICATIONS AT HOME: Prior to Admission medications   Medication Sig Start Date End Date Taking? Authorizing Provider  folic acid (FOLVITE) 1 MG tablet Take 1 tablet (1 mg total) by mouth daily. 01/05/18  Yes Mike Gipouglas, Andre, FNP  hydroxyurea (HYDREA) 500 MG capsule Take 2 capsules (1,000 mg total) by mouth daily. May take with food to minimize GI side effects. 02/18/18  Yes Quentin AngstJegede, Maika Kaczmarek E, MD  ibuprofen (ADVIL,MOTRIN) 600 MG tablet Take 1 tablet (600 mg total) by mouth every 8 (eight) hours as needed for headache, mild pain or moderate pain. 03/01/18  Yes Quentin AngstJegede, Didier Brandenburg E, MD  oxycodone (ROXICODONE) 30 MG immediate release tablet Take 1 tablet (30 mg total) by mouth every 4 (four) hours as needed for up to 15 days for pain. 03/01/18 03/16/18 Yes Edwing Figley, Phylliss Blakeslugbemiga E, MD  pantoprazole (PROTONIX) 40 MG tablet Take by mouth. 02/15/18  Yes [provider]  triamcinolone ointment (KENALOG) 0.5 % Apply 1 application topically 2 (two) times daily. Patient taking differently: Apply 1 application topically 2 (two) times daily as needed (rash).  12/06/17  Yes Mike Gipouglas, Andre, FNP    Objective:   Vitals:   03/01/18 0935  BP: 111/67  Pulse: 82  Resp: 16  Temp: 98.9 F (37.2 C)  TempSrc: Oral  SpO2: 100%  Weight: 137 lb (62.1 kg)  Height: 5\' 10"  (1.778 m)   Exam General appearance : Awake, alert, not in any distress. Speech Clear. Not toxic looking HEENT: Atraumatic and Normocephalic, pupils equally reactive to light and accomodation Neck: Supple, no JVD. No cervical  lymphadenopathy.  Chest: Good air entry bilaterally, no added sounds  CVS: S1 S2 regular, no murmurs.  Abdomen: Bowel sounds present, Non tender and not distended with no gaurding, rigidity or rebound. Extremities: B/L Lower Ext shows no edema, both legs are warm to touch Neurology: Awake alert, and oriented X 3, CN II-XII intact, Non focal Skin: No Rash  Data Review No results found for: HGBA1C  Assessment & Plan   1. Sickle cell pain crisis (HCC)  - Urinalysis Dipstick - CBC with Differential  - oxycodone (ROXICODONE) 30 MG immediate release tablet; Take 1 tablet (30 mg total) by mouth every 4 (four) hours as needed for up to 15 days for pain.  Dispense: 90 tablet; Refill: 0 - ibuprofen (ADVIL,MOTRIN) 600 MG tablet; Take 1 tablet (600 mg total) by mouth every 8 (eight) hours as needed for headache, mild pain or moderate pain.  Dispense: 60 tablet; Refill: 2  2. Chronic pain syndrome  - oxycodone (ROXICODONE) 30 MG immediate release tablet; Take 1 tablet (30 mg total) by mouth every 4 (four) hours as needed for up to 15 days for pain.  Dispense: 90 tablet; Refill: 0  3. Tobacco abuse Toree was counseled on the dangers of tobacco use, and was advised to quit. Reviewed strategies to maximize success, including removing cigarettes and smoking materials from environment, stress management and support of family/friends.  Patient have been counseled extensively about nutrition and exercise. Other issues discussed during this visit include: low cholesterol diet, weight control and daily exercise, annual eye examinations at Ophthalmology, importance of adherence with medications and regular follow-up.  Return in about 3 months (around 05/31/2018) for Sickle Cell Disease/Pain.  The patient was given clear instructions to go to ER or return to medical center if symptoms don't improve, worsen or new problems develop. The patient verbalized understanding. The patient was told to call to get lab  results if they haven't heard anything in the next week.   This note has been created with Education officer, environmental. Any transcriptional errors are unintentional.    Jeanann Lewandowsky, MD, MHA, Maxwell Caul, CPE [facility] [place], [place] [phone]   03/01/2018, 10:10 AM

## 2018-03-02 LAB — CBC WITH DIFFERENTIAL/PLATELET
Basophils Absolute: 0.1 10*3/uL (ref 0.0–0.2)
Basos: 1 %
EOS (ABSOLUTE): 0.3 10*3/uL (ref 0.0–0.4)
EOS: 2 %
HEMOGLOBIN: 9.6 g/dL — AB (ref 13.0–17.7)
Hematocrit: 29.4 % — ABNORMAL LOW (ref 37.5–51.0)
IMMATURE GRANULOCYTES: 1 %
Immature Grans (Abs): 0.1 10*3/uL (ref 0.0–0.1)
LYMPHS: 16 %
Lymphocytes Absolute: 2.4 10*3/uL (ref 0.7–3.1)
MCH: 32 pg (ref 26.6–33.0)
MCHC: 32.7 g/dL (ref 31.5–35.7)
MCV: 98 fL — ABNORMAL HIGH (ref 79–97)
MONOCYTES: 9 %
Monocytes Absolute: 1.3 10*3/uL — ABNORMAL HIGH (ref 0.1–0.9)
NEUTROS PCT: 71 %
NRBC: 1 % — AB (ref 0–0)
Neutrophils Absolute: 10.9 10*3/uL — ABNORMAL HIGH (ref 1.4–7.0)
Platelets: 360 10*3/uL (ref 150–450)
RBC: 3 x10E6/uL — ABNORMAL LOW (ref 4.14–5.80)
RDW: 18.1 % — ABNORMAL HIGH (ref 11.6–15.4)
WBC: 15.2 10*3/uL — AB (ref 3.4–10.8)

## 2018-03-04 ENCOUNTER — Telehealth: Payer: Self-pay

## 2018-03-04 LAB — OXYCODONE/OXYMORPHONE, CONFIRM
OXYCODONE/OXYMORPH: POSITIVE — AB
OXYCODONE: NEGATIVE
OXYMORPHONE (GC/MS): 1921 ng/mL
OXYMORPHONE: POSITIVE — AB

## 2018-03-04 LAB — DRUG SCREEN 764883 11+OXYCO+ALC+CRT-BUND
Amphetamines, Urine: NEGATIVE ng/mL
BENZODIAZ UR QL: NEGATIVE ng/mL
Barbiturate: NEGATIVE ng/mL
Cannabinoid Quant, Ur: NEGATIVE ng/mL
Cocaine (Metabolite): NEGATIVE ng/mL
Creatinine: 71.9 mg/dL (ref 20.0–300.0)
Ethanol: NEGATIVE %
Meperidine: NEGATIVE ng/mL
Methadone Screen, Urine: NEGATIVE ng/mL
OPIATE SCREEN URINE: NEGATIVE ng/mL
Phencyclidine: NEGATIVE ng/mL
Propoxyphene: NEGATIVE ng/mL
Tramadol: NEGATIVE ng/mL
pH, Urine: 7.6 (ref 4.5–8.9)

## 2018-03-04 NOTE — Telephone Encounter (Signed)
-----   Message from [name], MD sent at 03/03/2018  6:44 PM EST ----- Hemoglobin is stable. Continue current medications

## 2018-03-04 NOTE — Telephone Encounter (Signed)
Called and spoke with patient, advised that hemoglobin is stable and asked that he continue current medications and keep next appointment. Patient verbalized understanding. Thanks!

## 2018-03-14 ENCOUNTER — Telehealth: Payer: Self-pay

## 2018-03-16 ENCOUNTER — Other Ambulatory Visit: Payer: Self-pay | Admitting: Internal Medicine

## 2018-03-16 DIAGNOSIS — G894 Chronic pain syndrome: Secondary | ICD-10-CM

## 2018-03-16 DIAGNOSIS — D5700 Hb-SS disease with crisis, unspecified: Secondary | ICD-10-CM

## 2018-03-16 MED ORDER — OXYCODONE HCL 30 MG PO TABS: 30.0000 mg | ORAL_TABLET | ORAL | 0 refills | Status: DC | PRN

## 2018-03-22 ENCOUNTER — Other Ambulatory Visit: Payer: Self-pay | Admitting: Internal Medicine

## 2018-03-22 NOTE — Telephone Encounter (Signed)
Refilled

## 2018-03-25 ENCOUNTER — Other Ambulatory Visit: Payer: Self-pay | Admitting: Internal Medicine

## 2018-03-25 DIAGNOSIS — D5700 Hb-SS disease with crisis, unspecified: Secondary | ICD-10-CM

## 2018-03-28 ENCOUNTER — Telehealth: Payer: Self-pay

## 2018-03-30 ENCOUNTER — Other Ambulatory Visit: Payer: Self-pay | Admitting: Internal Medicine

## 2018-03-30 DIAGNOSIS — D5700 Hb-SS disease with crisis, unspecified: Secondary | ICD-10-CM

## 2018-03-30 DIAGNOSIS — G894 Chronic pain syndrome: Secondary | ICD-10-CM

## 2018-03-30 MED ORDER — OXYCODONE HCL 30 MG PO TABS: 30.0000 mg | ORAL_TABLET | ORAL | 0 refills | Status: DC | PRN

## 2018-03-30 NOTE — Telephone Encounter (Signed)
Refilled

## 2018-04-11 ENCOUNTER — Other Ambulatory Visit: Payer: Self-pay | Admitting: Internal Medicine

## 2018-04-11 ENCOUNTER — Telehealth: Payer: Self-pay

## 2018-04-15 ENCOUNTER — Other Ambulatory Visit: Payer: Self-pay | Admitting: Internal Medicine

## 2018-04-15 ENCOUNTER — Telehealth (HOSPITAL_COMMUNITY): Payer: Self-pay | Admitting: *Deleted

## 2018-04-15 ENCOUNTER — Non-Acute Institutional Stay (HOSPITAL_COMMUNITY): Admission: AD | Admit: 2018-04-15 | Payer: Medicaid Other | Source: Ambulatory Visit | Admitting: Internal Medicine

## 2018-04-15 DIAGNOSIS — D5700 Hb-SS disease with crisis, unspecified: Secondary | ICD-10-CM

## 2018-04-15 DIAGNOSIS — G894 Chronic pain syndrome: Secondary | ICD-10-CM

## 2018-04-15 MED ORDER — OXYCODONE HCL 30 MG PO TABS: 30.0000 mg | ORAL_TABLET | ORAL | 0 refills | Status: DC | PRN

## 2018-04-15 NOTE — Telephone Encounter (Signed)
Refilled

## 2018-04-15 NOTE — Telephone Encounter (Signed)
Patient called requesting to come to the day hospital for sickle cell pain. Patient reports chest and back pain rated 7/10. Reports being out of prescribed pain medications and last taking pain medications two days ago. Denies fever, nausea, vomiting, diarrhea, abdominal pain and priapism.  [name] notified. Patient can come to the day hospital for pain management. Patient advised and expresses an understanding.

## 2018-04-28 ENCOUNTER — Other Ambulatory Visit: Payer: Self-pay | Admitting: Internal Medicine

## 2018-04-28 ENCOUNTER — Telehealth: Payer: Self-pay

## 2018-04-29 ENCOUNTER — Other Ambulatory Visit: Payer: Self-pay | Admitting: Internal Medicine

## 2018-04-29 ENCOUNTER — Telehealth (HOSPITAL_COMMUNITY): Payer: Self-pay | Admitting: General Practice

## 2018-04-29 DIAGNOSIS — G894 Chronic pain syndrome: Secondary | ICD-10-CM

## 2018-04-29 DIAGNOSIS — D5700 Hb-SS disease with crisis, unspecified: Secondary | ICD-10-CM

## 2018-04-29 MED ORDER — OXYCODONE HCL 30 MG PO TABS: 30.0000 mg | ORAL_TABLET | ORAL | 0 refills | Status: DC | PRN

## 2018-04-29 NOTE — Telephone Encounter (Signed)
Refilled

## 2018-04-29 NOTE — Telephone Encounter (Signed)
Patient called, complained of pain in the back that he rated as 8 /10. Denied chest pain, fever, diarrhea, priapism and nausea/vomitting. Endorsed lower abdominal pain. Admitted to having means of transportation without driving self after treatment ('Drive down myself but will be taken home by my father" after treatment). Last took Oxycodone 30 mg yesterday. "Ran out" of home pain medication. Per provider, medication refill is not due until tomorrow. This information was conveyed to the patient. Provider notified, as for today, patient can come in for treatment. Patient notified, verbalized understanding.

## 2018-05-12 ENCOUNTER — Telehealth: Payer: Self-pay

## 2018-05-13 ENCOUNTER — Other Ambulatory Visit: Payer: Self-pay | Admitting: Internal Medicine

## 2018-05-13 DIAGNOSIS — D5700 Hb-SS disease with crisis, unspecified: Secondary | ICD-10-CM

## 2018-05-13 DIAGNOSIS — G894 Chronic pain syndrome: Secondary | ICD-10-CM

## 2018-05-13 MED ORDER — OXYCODONE HCL 30 MG PO TABS: 30.0000 mg | ORAL_TABLET | ORAL | 0 refills | Status: DC | PRN

## 2018-05-13 NOTE — Telephone Encounter (Signed)
Refilled

## 2018-05-27 ENCOUNTER — Telehealth: Payer: Self-pay

## 2018-05-27 ENCOUNTER — Other Ambulatory Visit: Payer: Self-pay | Admitting: Internal Medicine

## 2018-05-27 DIAGNOSIS — D5700 Hb-SS disease with crisis, unspecified: Secondary | ICD-10-CM

## 2018-05-27 DIAGNOSIS — G894 Chronic pain syndrome: Secondary | ICD-10-CM

## 2018-05-27 MED ORDER — OXYCODONE HCL 30 MG PO TABS: 30.0000 mg | ORAL_TABLET | ORAL | 0 refills | Status: DC | PRN

## 2018-05-27 NOTE — Telephone Encounter (Signed)
error 

## 2018-05-27 NOTE — Telephone Encounter (Signed)
Refilled for 05/29/2018

## 2018-06-09 ENCOUNTER — Telehealth: Payer: Self-pay

## 2018-06-10 ENCOUNTER — Other Ambulatory Visit: Payer: Self-pay | Admitting: Internal Medicine

## 2018-06-14 ENCOUNTER — Other Ambulatory Visit: Payer: Self-pay | Admitting: Internal Medicine

## 2018-06-14 DIAGNOSIS — D5700 Hb-SS disease with crisis, unspecified: Secondary | ICD-10-CM

## 2018-06-14 DIAGNOSIS — G894 Chronic pain syndrome: Secondary | ICD-10-CM

## 2018-06-14 MED ORDER — OXYCODONE HCL 30 MG PO TABS: 30.0000 mg | ORAL_TABLET | ORAL | 0 refills | Status: DC | PRN

## 2018-06-14 NOTE — Telephone Encounter (Signed)
Refilled

## 2018-06-16 NOTE — Telephone Encounter (Signed)
Message sent to provider 

## 2018-06-24 ENCOUNTER — Other Ambulatory Visit: Payer: Self-pay | Admitting: Internal Medicine

## 2018-06-24 ENCOUNTER — Telehealth: Payer: Self-pay

## 2018-06-27 ENCOUNTER — Other Ambulatory Visit: Payer: Self-pay | Admitting: Internal Medicine

## 2018-06-28 ENCOUNTER — Other Ambulatory Visit: Payer: Self-pay | Admitting: Internal Medicine

## 2018-06-28 DIAGNOSIS — G894 Chronic pain syndrome: Secondary | ICD-10-CM

## 2018-06-28 DIAGNOSIS — D5700 Hb-SS disease with crisis, unspecified: Secondary | ICD-10-CM

## 2018-06-28 MED ORDER — OXYCODONE HCL 30 MG PO TABS: 30.0000 mg | ORAL_TABLET | ORAL | 0 refills | Status: DC | PRN

## 2018-06-28 NOTE — Telephone Encounter (Signed)
Refilled for 06/29/2018

## 2018-07-11 ENCOUNTER — Telehealth: Payer: Self-pay

## 2018-07-11 ENCOUNTER — Other Ambulatory Visit: Payer: Self-pay | Admitting: Internal Medicine

## 2018-07-14 ENCOUNTER — Other Ambulatory Visit: Payer: Self-pay | Admitting: Internal Medicine

## 2018-07-14 DIAGNOSIS — D5700 Hb-SS disease with crisis, unspecified: Secondary | ICD-10-CM

## 2018-07-14 DIAGNOSIS — G894 Chronic pain syndrome: Secondary | ICD-10-CM

## 2018-07-14 MED ORDER — OXYCODONE HCL 30 MG PO TABS: 30.0000 mg | ORAL_TABLET | ORAL | 0 refills | Status: DC | PRN

## 2018-07-14 NOTE — Telephone Encounter (Signed)
Refill

## 2018-07-25 ENCOUNTER — Telehealth: Payer: Self-pay

## 2018-07-25 NOTE — Telephone Encounter (Signed)
This has been sent in on [employer] [facility].

## 2018-08-08 ENCOUNTER — Telehealth: Payer: Self-pay

## 2018-08-09 ENCOUNTER — Other Ambulatory Visit: Payer: Self-pay | Admitting: Internal Medicine

## 2018-08-09 DIAGNOSIS — D5700 Hb-SS disease with crisis, unspecified: Secondary | ICD-10-CM

## 2018-08-09 DIAGNOSIS — G894 Chronic pain syndrome: Secondary | ICD-10-CM

## 2018-08-09 MED ORDER — OXYCODONE HCL 30 MG PO TABS: 30.0000 mg | ORAL_TABLET | ORAL | 0 refills | Status: DC | PRN

## 2018-08-09 NOTE — Telephone Encounter (Signed)
Refilled

## 2018-08-16 ENCOUNTER — Encounter: Payer: Self-pay | Admitting: Family Medicine

## 2018-08-16 ENCOUNTER — Other Ambulatory Visit: Payer: Self-pay

## 2018-08-16 ENCOUNTER — Ambulatory Visit (INDEPENDENT_AMBULATORY_CARE_PROVIDER_SITE_OTHER): Payer: Medicaid Other | Admitting: Family Medicine

## 2018-08-16 VITALS — BP 106/60 | HR 84 | Temp 98.8°F | Resp 14 | Ht 70.0 in | Wt 124.0 lb

## 2018-08-16 DIAGNOSIS — D571 Sickle-cell disease without crisis: Secondary | ICD-10-CM | POA: Diagnosis not present

## 2018-08-16 DIAGNOSIS — L209 Atopic dermatitis, unspecified: Secondary | ICD-10-CM

## 2018-08-16 DIAGNOSIS — D5700 Hb-SS disease with crisis, unspecified: Secondary | ICD-10-CM

## 2018-08-16 DIAGNOSIS — F17200 Nicotine dependence, unspecified, uncomplicated: Secondary | ICD-10-CM

## 2018-08-16 DIAGNOSIS — R110 Nausea: Secondary | ICD-10-CM

## 2018-08-16 LAB — POCT URINALYSIS DIPSTICK
Bilirubin, UA: NEGATIVE
Blood, UA: NEGATIVE
Glucose, UA: NEGATIVE
Ketones, UA: NEGATIVE
Leukocytes, UA: NEGATIVE
Nitrite, UA: NEGATIVE
Protein, UA: NEGATIVE
Spec Grav, UA: 1.015 (ref 1.010–1.025)
Urobilinogen, UA: 1 E.U./dL
pH, UA: 6.5 (ref 5.0–8.0)

## 2018-08-16 MED ORDER — ONDANSETRON HCL 4 MG PO TABS: 4.0000 mg | ORAL_TABLET | Freq: Three times a day (TID) | ORAL | 1 refills | Status: DC | PRN

## 2018-08-16 MED ORDER — CRISABOROLE 2 % EX OINT: 1.0000 "application " | TOPICAL_OINTMENT | Freq: Two times a day (BID) | CUTANEOUS | 2 refills | Status: DC

## 2018-08-16 NOTE — Patient Instructions (Addendum)
Discussed the importance of drinking 64 ounces of water daily. The Importance of Water. To help prevent pain crises, it is important to drink plenty of water throughout the day. This is because dehydration of red blood cells may lead to the sickling process.    We discussed in detail that hydroxyurea has been found to be effective in reducing VOC, transfusions, and admissions for pain. It also reduces the duration of pain episodes by at least 50%. It depends on compliance. Compliance can be assessed by a rise in [facility] and drop in WBC/retic/ANC, and platelet count. It typically causes a rise in hemoglobin F, which can be assessed via hemoglobinopathy. It takes 3-6 months to see optimal effects of hydroxyurea therapy.   Discontinue triamcinolone, which has not been effective. Start crisaborole twice daily, apply to affected areas.     Eczema Eczema is a broad term for a group of skin conditions that cause skin to become rough and inflamed. Each type of eczema has different triggers, symptoms, and treatments. Eczema of any type is usually itchy and symptoms range from mild to severe. Eczema and its symptoms are not spread from person to person (are not contagious). It can appear on different parts of the body at different times. Your eczema may not look the same as someone else's eczema. What are the types of eczema? Atopic dermatitis This is a long-term (chronic) skin disease that keeps coming back (recurring). Usual symptoms are dry skin and small, solid pimples that may swell and leak fluid (weep). Contact dermatitis  This happens when something irritates the skin and causes a rash. The irritation can come from substances that you are allergic to (allergens), such as poison ivy, chemicals, or medicines that were applied to your skin. Dyshidrotic eczema This is a form of eczema on the hands and feet. It shows up as very itchy, fluid-filled blisters. It can affect people of any age, but is more  common before age 16. Hand eczema  This causes very itchy areas of skin on the palms and sides of the hands and fingers. This type of eczema is common in industrial jobs where you may be exposed to many different types of irritants. Lichen simplex chronicus This type of eczema occurs when a person constantly scratches one area of the body. Repeated scratching of the area leads to thickened skin (lichenification). Lichen simplex chronicus can occur along with other types of eczema. It is more common in adults, but may be seen in children as well. Nummular eczema This is a common type of eczema. It has no known cause. It typically causes a red, circular, crusty lesion (plaque) that may be itchy. Scratching may become a habit and can cause bleeding. Nummular eczema occurs most often in people of middle-age or older. It most often affects the hands. Seborrheic dermatitis This is a common skin disease that mainly affects the scalp. It may also affect any oily areas of the body, such as the face, sides of nose, eyebrows, ears, eyelids, and chest. It is marked by small scaling and redness of the skin (erythema). This can affect people of all ages. In infants, this condition is known as Chart[employer] certified accountant." Stasis dermatitis This is a common skin disease that usually appears on the legs and feet. It most often occurs in people who have a condition that prevents blood from being pumped through the veins in the legs (chronic venous insufficiency). Stasis dermatitis is a chronic condition that needs long-term management. How is  eczema diagnosed? Your health care provider will examine your skin and review your medical history. He or she may also give you skin patch tests. These tests involve taking patches that contain possible allergens and placing them on your back. He or she will then check in a few days to see if an allergic reaction occurred. What are the common treatments? Treatment for eczema is based on the type  of eczema you have. Hydrocortisone steroid medicine can relieve itching quickly and help reduce inflammation. This medicine may be prescribed or obtained over-the-counter, depending on the strength of the medicine that is needed. Follow these instructions at home:  Take over-the-counter and prescription medicines only as told by your health care provider.  Use creams or ointments to moisturize your skin. Do not use lotions.  Learn what triggers or irritates your symptoms. Avoid these things.  Treat symptom flare-ups quickly.  Do not itch your skin. This can make your rash worse.  Keep all follow-up visits as told by your health care provider. This is important. Where to find more information  The American Academy of Dermatology: InfoExam.siwww.aad.org  The National Eczema Association: www.nationaleczema.org Contact a health care provider if:  You have serious itching, even with treatment.  You regularly scratch your skin until it bleeds.  Your rash looks different than usual.  Your skin is painful, swollen, or more red than usual.  You have a fever. Summary  There are eight general types of eczema. Each type has different triggers.  Eczema of any type causes itching that may range from mild to severe.  Treatment varies based on the type of eczema you have. Hydrocortisone steroid medicine can help with itching and inflammation.  Protecting your skin is the best way to prevent eczema. Use moisturizers and lotions. Avoid triggers and irritants, and treat flare-ups quickly. This information is not intended to replace advice given to you by your health care provider. Make sure you discuss any questions you have with your health care provider. Document Released: 06/11/2016 Document Revised: 01/08/2017 Document Reviewed: 06/11/2016 Elsevier Patient Education  2020 [employer] Inc.   Sickle Cell Anemia, Adult  Sickle cell anemia is a condition where your red blood cells are shaped like  sickles. Red blood cells carry oxygen through the body. Sickle-shaped cells do not live as long as normal red blood cells. They also clump together and block blood from flowing through the blood vessels. This prevents the body from getting enough oxygen. Sickle cell anemia causes organ damage and pain. It also increases the risk of infection. Follow these instructions at home: Medicines  Take over-the-counter and prescription medicines only as told by your doctor.  If you were prescribed an antibiotic medicine, take it as told by your doctor. Do not stop taking the antibiotic even if you start to feel better.  If you develop a fever, do not take medicines to lower the fever right away. Tell your doctor about the fever. Managing pain, stiffness, and swelling  Try these methods to help with pain: ? Use a heating pad. ? Take a warm bath. ? Distract yourself, such as by watching TV. Eating and drinking  Drink enough fluid to keep your pee (urine) clear or pale yellow. Drink more in hot weather and during exercise.  Limit or avoid alcohol.  Eat a healthy diet. Eat plenty of fruits, vegetables, whole grains, and lean protein.  Take vitamins and supplements as told by your doctor. Traveling  When traveling, keep these with you: ?  Your medical information. ? The names of your doctors. ? Your medicines.  If you need to take an airplane, talk to your doctor first. Activity  Rest often.  Avoid exercises that make your heart beat much faster, such as jogging. General instructions  Do not use products that have nicotine or tobacco, such as cigarettes and e-cigarettes. If you need help quitting, ask your doctor.  Consider wearing a medical alert bracelet.  Avoid being in high places (high altitudes), such as mountains.  Avoid very hot or cold temperatures.  Avoid places where the temperature changes a lot.  Keep all follow-up visits as told by your doctor. This is  important. Contact a doctor if:  A joint hurts.  Your feet or hands hurt or swell.  You feel tired (fatigued). Get help right away if:  You have symptoms of infection. These include: ? Fever. ? Chills. ? Being very tired. ? Irritability. ? Poor eating. ? Throwing up (vomiting).  You feel dizzy or faint.  You have new stomach pain, especially on the left side.  You have a an erection (priapism) that lasts more than 4 hours.  You have numbness in your arms or legs.  You have a hard time moving your arms or legs.  You have trouble talking.  You have pain that does not go away when you take medicine.  You are short of breath.  You are breathing fast.  You have a long-term cough.  You have pain in your chest.  You have a bad headache.  You have a stiff neck.  Your stomach looks bloated even though you did not eat much.  Your skin is pale.  You suddenly cannot see well. Summary  Sickle cell anemia is a condition where your red blood cells are shaped like sickles.  Follow your doctor's advice on ways to manage pain, food to eat, activities to do, and steps to take for safe travel.  Get medical help right away if you have any signs of infection, such as a fever. This information is not intended to replace advice given to you by your health care provider. Make sure you discuss any questions you have with your health care provider. Document Released: 11/16/2012 Document Revised: 05/20/2018 Document Reviewed: 03/03/2016 Elsevier Patient Education  2020 [employer]
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
## 2018-08-20 LAB — CMP AND LIVER
ALT: 14 IU/L (ref 0–44)
AST: 42 IU/L — ABNORMAL HIGH (ref 0–40)
Albumin: 5.3 g/dL — ABNORMAL HIGH (ref 4.1–5.2)
Alkaline Phosphatase: 91 IU/L (ref 39–117)
BUN: 11 mg/dL (ref 6–20)
Bilirubin Total: 3.9 mg/dL — ABNORMAL HIGH (ref 0.0–1.2)
Bilirubin, Direct: 0.35 mg/dL (ref 0.00–0.40)
CO2: 21 mmol/L (ref 20–29)
Calcium: 10 mg/dL (ref 8.7–10.2)
Chloride: 99 mmol/L (ref 96–106)
Creatinine, Ser: 0.72 mg/dL — ABNORMAL LOW (ref 0.76–1.27)
GFR calc Af Amer: 145 mL/min/{1.73_m2} (ref >59)
GFR calc non Af Amer: 125 mL/min/{1.73_m2} (ref >59)
Glucose: 99 mg/dL (ref 65–99)
Potassium: 4.7 mmol/L (ref 3.5–5.2)
Sodium: 136 mmol/L (ref 134–144)
Total Protein: 8.6 g/dL — ABNORMAL HIGH (ref 6.0–8.5)

## 2018-08-20 LAB — CBC WITH DIFFERENTIAL/PLATELET
Basophils Absolute: 0.1 10*3/uL (ref 0.0–0.2)
Basos: 1 %
EOS (ABSOLUTE): 0.5 10*3/uL — ABNORMAL HIGH (ref 0.0–0.4)
Eos: 3 %
Hematocrit: 26.9 % — ABNORMAL LOW (ref 37.5–51.0)
Hemoglobin: 8.9 g/dL — ABNORMAL LOW (ref 13.0–17.7)
Immature Grans (Abs): 0.1 10*3/uL (ref 0.0–0.1)
Immature Granulocytes: 1 %
Lymphocytes Absolute: 2.8 10*3/uL (ref 0.7–3.1)
Lymphs: 18 %
MCH: 32.8 pg (ref 26.6–33.0)
MCHC: 33.1 g/dL (ref 31.5–35.7)
MCV: 99 fL — ABNORMAL HIGH (ref 79–97)
Monocytes Absolute: 1.9 10*3/uL — ABNORMAL HIGH (ref 0.1–0.9)
Monocytes: 12 %
Neutrophils Absolute: 10.2 10*3/uL — ABNORMAL HIGH (ref 1.4–7.0)
Neutrophils: 65 %
Platelets: 370 10*3/uL (ref 150–450)
RBC: 2.71 x10E6/uL — CL (ref 4.14–5.80)
RDW: 18.9 % — ABNORMAL HIGH (ref 11.6–15.4)
WBC: 15.6 10*3/uL — ABNORMAL HIGH (ref 3.4–10.8)

## 2018-08-20 LAB — VITAMIN D 1,25 DIHYDROXY
Vitamin D 1, 25 (OH)2 Total: 36 pg/mL
Vitamin D2 1, 25 (OH)2: <10 pg/mL
Vitamin D3 1, 25 (OH)2: 35 pg/mL

## 2018-08-20 LAB — FERRITIN: Ferritin: 1931 ng/mL — ABNORMAL HIGH (ref 30–400)

## 2018-08-22 ENCOUNTER — Telehealth (HOSPITAL_COMMUNITY): Payer: Self-pay | Admitting: *Deleted

## 2018-08-22 ENCOUNTER — Telehealth: Payer: Self-pay

## 2018-08-22 NOTE — Telephone Encounter (Signed)
Patient called requesting to come to the day hospital for sickle cell pain. Reports back and bilateral leg pain rated 7/10. Reports running out of Oxycodone yesterday. Per patient, he has to catch a flight to [place] at 12:30 PM. This RN informed patient that he would not be appropriate for the day hospital today because he would not have adequate time for treatment. Patient's Oxycodone prescription is not due until 08/24/18. Notified Dr. Doreene Burke of patient's prescription request.

## 2018-08-22 NOTE — Telephone Encounter (Signed)
Patient needs a refill on oxycodone. Please

## 2018-08-23 ENCOUNTER — Other Ambulatory Visit (HOSPITAL_COMMUNITY): Payer: Self-pay | Admitting: Family Medicine

## 2018-08-23 ENCOUNTER — Telehealth: Payer: Self-pay

## 2018-08-23 ENCOUNTER — Other Ambulatory Visit: Payer: Self-pay | Admitting: Internal Medicine

## 2018-08-23 ENCOUNTER — Telehealth (HOSPITAL_COMMUNITY): Payer: Self-pay | Admitting: *Deleted

## 2018-08-23 DIAGNOSIS — G894 Chronic pain syndrome: Secondary | ICD-10-CM

## 2018-08-23 DIAGNOSIS — D5700 Hb-SS disease with crisis, unspecified: Secondary | ICD-10-CM

## 2018-08-23 LAB — DRUG SCREEN 764883 11+OXYCO+ALC+CRT-BUND
Amphetamines, Urine: NEGATIVE ng/mL
BENZODIAZ UR QL: NEGATIVE ng/mL
Barbiturate: NEGATIVE ng/mL
Cannabinoid Quant, Ur: NEGATIVE ng/mL
Cocaine (Metabolite): NEGATIVE ng/mL
Creatinine: 72.2 mg/dL (ref 20.0–300.0)
Ethanol: NEGATIVE %
Meperidine: NEGATIVE ng/mL
Methadone Screen, Urine: NEGATIVE ng/mL
Phencyclidine: NEGATIVE ng/mL
Propoxyphene: NEGATIVE ng/mL
Tramadol: NEGATIVE ng/mL
pH, Urine: 6.4 (ref 4.5–8.9)

## 2018-08-23 LAB — OXYCODONE/OXYMORPHONE, CONFIRM
OXYCODONE/OXYMORPH: POSITIVE — AB
OXYCODONE: >3000 ng/mL
OXYCODONE: POSITIVE — AB
OXYMORPHONE (GC/MS): >3000 ng/mL
OXYMORPHONE: POSITIVE — AB

## 2018-08-23 LAB — OPIATES CONFIRMATION, URINE: Opiates: NEGATIVE ng/mL

## 2018-08-23 MED ORDER — OXYCODONE HCL 30 MG PO TABS: 30.0000 mg | ORAL_TABLET | ORAL | 0 refills | Status: DC | PRN

## 2018-08-23 NOTE — Telephone Encounter (Signed)
Refilled

## 2018-08-23 NOTE — Telephone Encounter (Signed)
Patient called requesting to come to the day hospital for sickle cell pain. Reports back and bilateral leg pain rated 8/10. Reports taking Oxycodone 30 mg for pain yesterday but currently being out of this medication.  COVID-19 screening done and negative.  Patient denies fever, chest pain, nausea, vomiting, diarrhea and priapism. Reports some mild abdominal pain. Patient does not having means of transportation at discharge without driving him self. [place], [place] notified. Patient advised by provider to take his Folic acid, Hydroxyurea, 800 mg Ibuprofen q 8 hours, 1000 mg Tylenol q 6 hours and to hydrate with 64 ounces of water. Patient's Oxycodone is due to be refilled tomorrow and the provider will call it in so patient can pick it up tomorrow.  Patient can call back to the day hospital in the morning if necessary but he must have means of transportation. Patient given these instructions and expresses an understanding.

## 2018-08-29 ENCOUNTER — Emergency Department (HOSPITAL_BASED_OUTPATIENT_CLINIC_OR_DEPARTMENT_OTHER)
Admission: EM | Admit: 2018-08-29 | Discharge: 2018-08-29 | Disposition: A | Discharge status: Home / Self Care | Payer: Medicaid Other | Attending: Emergency Medicine | Admitting: Emergency Medicine

## 2018-08-29 ENCOUNTER — Encounter (HOSPITAL_BASED_OUTPATIENT_CLINIC_OR_DEPARTMENT_OTHER): Payer: Self-pay

## 2018-08-29 ENCOUNTER — Other Ambulatory Visit: Payer: Self-pay

## 2018-08-29 DIAGNOSIS — M79606 Pain in leg, unspecified: Secondary | ICD-10-CM | POA: Insufficient documentation

## 2018-08-29 DIAGNOSIS — G894 Chronic pain syndrome: Secondary | ICD-10-CM | POA: Insufficient documentation

## 2018-08-29 DIAGNOSIS — R079 Chest pain, unspecified: Secondary | ICD-10-CM | POA: Insufficient documentation

## 2018-08-29 DIAGNOSIS — D5700 Hb-SS disease with crisis, unspecified: Secondary | ICD-10-CM

## 2018-08-29 DIAGNOSIS — Z79899 Other long term (current) drug therapy: Secondary | ICD-10-CM | POA: Diagnosis not present

## 2018-08-29 DIAGNOSIS — F17210 Nicotine dependence, cigarettes, uncomplicated: Secondary | ICD-10-CM | POA: Diagnosis not present

## 2018-08-29 MED ORDER — OXYCODONE HCL 5 MG PO TABS
30.0000 mg | ORAL_TABLET | Freq: Once | ORAL | Status: AC
  Administered 2018-08-29: 22:00:00 30 mg via ORAL
  Filled 2018-08-29: qty 6

## 2018-08-29 MED ORDER — ONDANSETRON 4 MG PO TBDP
4.0000 mg | ORAL_TABLET | Freq: Once | ORAL | Status: AC
  Administered 2018-08-29: 4 mg via ORAL
  Filled 2018-08-29: qty 1
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
## 2018-08-29 NOTE — ED Notes (Signed)
Pt has been seen and treated today at [facility]. Pt has lab results from today in chart.

## 2018-08-29 NOTE — ED Notes (Signed)
Pt not in room. Pt did not wait for discharge papers

## 2018-08-29 NOTE — ED Notes (Signed)
ED Provider at bedside. 

## 2018-08-29 NOTE — ED Triage Notes (Addendum)
Pt c/o SSC pain x 3 days-pain to chest, both legs-n/v-NAD-steady gait

## 2018-08-30 ENCOUNTER — Encounter (HOSPITAL_COMMUNITY): Payer: Self-pay

## 2018-08-30 ENCOUNTER — Inpatient Hospital Stay (HOSPITAL_COMMUNITY)
Admission: EM | Admit: 2018-08-30 | Discharge: 2018-08-31 | DRG: 812 | Discharge status: Against Medical Advice | Payer: Medicaid Other | Attending: Internal Medicine | Admitting: Internal Medicine

## 2018-08-30 ENCOUNTER — Other Ambulatory Visit: Payer: Self-pay

## 2018-08-30 DIAGNOSIS — D5700 Hb-SS disease with crisis, unspecified: Secondary | ICD-10-CM | POA: Diagnosis present

## 2018-08-30 DIAGNOSIS — Z1159 Encounter for screening for other viral diseases: Secondary | ICD-10-CM

## 2018-08-30 DIAGNOSIS — G894 Chronic pain syndrome: Secondary | ICD-10-CM | POA: Diagnosis present

## 2018-08-30 DIAGNOSIS — F17210 Nicotine dependence, cigarettes, uncomplicated: Secondary | ICD-10-CM | POA: Diagnosis present

## 2018-08-30 DIAGNOSIS — Z881 Allergy status to other antibiotic agents status: Secondary | ICD-10-CM

## 2018-08-30 DIAGNOSIS — F1410 Cocaine abuse, uncomplicated: Secondary | ICD-10-CM | POA: Diagnosis present

## 2018-08-30 DIAGNOSIS — Z9049 Acquired absence of other specified parts of digestive tract: Secondary | ICD-10-CM

## 2018-08-30 DIAGNOSIS — Z79899 Other long term (current) drug therapy: Secondary | ICD-10-CM

## 2018-08-30 DIAGNOSIS — Z832 Family history of diseases of the blood and blood-forming organs and certain disorders involving the immune mechanism: Secondary | ICD-10-CM

## 2018-08-30 LAB — CBC WITH DIFFERENTIAL/PLATELET
Abs Immature Granulocytes: 0.13 10*3/uL — ABNORMAL HIGH (ref 0.00–0.07)
Basophils Absolute: 0.1 10*3/uL (ref 0.0–0.1)
Basophils Relative: 1 %
Eosinophils Absolute: 1 10*3/uL — ABNORMAL HIGH (ref 0.0–0.5)
Eosinophils Relative: 5 %
HCT: 26.5 % — ABNORMAL LOW (ref 39.0–52.0)
Hemoglobin: 8.9 g/dL — ABNORMAL LOW (ref 13.0–17.0)
Immature Granulocytes: 1 %
Lymphocytes Relative: 28 %
Lymphs Abs: 5.5 10*3/uL — ABNORMAL HIGH (ref 0.7–4.0)
MCH: 32.2 pg (ref 26.0–34.0)
MCHC: 33.6 g/dL (ref 30.0–36.0)
MCV: 96 fL (ref 80.0–100.0)
Monocytes Absolute: 2.1 10*3/uL — ABNORMAL HIGH (ref 0.1–1.0)
Monocytes Relative: 11 %
Neutro Abs: 10.6 10*3/uL — ABNORMAL HIGH (ref 1.7–7.7)
Neutrophils Relative %: 54 %
Platelets: 367 10*3/uL (ref 150–400)
RBC: 2.76 MIL/uL — ABNORMAL LOW (ref 4.22–5.81)
RDW: 18 % — ABNORMAL HIGH (ref 11.5–15.5)
WBC: 19.5 10*3/uL — ABNORMAL HIGH (ref 4.0–10.5)
nRBC: 0.4 % — ABNORMAL HIGH (ref 0.0–0.2)

## 2018-08-30 LAB — COMPREHENSIVE METABOLIC PANEL
ALT: 13 U/L (ref 0–44)
AST: 28 U/L (ref 15–41)
Albumin: 4.4 g/dL (ref 3.5–5.0)
Alkaline Phosphatase: 75 U/L (ref 38–126)
Anion gap: 8 (ref 5–15)
BUN: 13 mg/dL (ref 6–20)
CO2: 22 mmol/L (ref 22–32)
Calcium: 9.1 mg/dL (ref 8.9–10.3)
Chloride: 106 mmol/L (ref 98–111)
Creatinine, Ser: 0.71 mg/dL (ref 0.61–1.24)
GFR calc Af Amer: >60 mL/min (ref >60)
GFR calc non Af Amer: >60 mL/min (ref >60)
Glucose, Bld: 114 mg/dL — ABNORMAL HIGH (ref 70–99)
Potassium: 3.7 mmol/L (ref 3.5–5.1)
Sodium: 136 mmol/L (ref 135–145)
Total Bilirubin: 3.7 mg/dL — ABNORMAL HIGH (ref 0.3–1.2)
Total Protein: 8.2 g/dL — ABNORMAL HIGH (ref 6.5–8.1)

## 2018-08-30 LAB — RETICULOCYTES
Immature Retic Fract: 20.4 % — ABNORMAL HIGH (ref 2.3–15.9)
RBC.: 2.76 MIL/uL — ABNORMAL LOW (ref 4.22–5.81)
Retic Count, Absolute: 214.2 10*3/uL — ABNORMAL HIGH (ref 19.0–186.0)
Retic Ct Pct: 7.8 % — ABNORMAL HIGH (ref 0.4–3.1)

## 2018-08-30 MED ORDER — SODIUM CHLORIDE 0.9% FLUSH
3.0000 mL | Freq: Once | INTRAVENOUS | Status: AC
  Administered 2018-08-31: 3 mL via INTRAVENOUS

## 2018-08-30 NOTE — ED Triage Notes (Signed)
Pt reports sickle cell pain all over for the past 2 days.

## 2018-08-31 ENCOUNTER — Non-Acute Institutional Stay (HOSPITAL_COMMUNITY): Admission: AD | Admit: 2018-08-31 | Payer: Medicaid Other | Source: Ambulatory Visit | Admitting: Internal Medicine

## 2018-08-31 ENCOUNTER — Other Ambulatory Visit: Payer: Self-pay | Admitting: Family Medicine

## 2018-08-31 DIAGNOSIS — Z79899 Other long term (current) drug therapy: Secondary | ICD-10-CM | POA: Diagnosis not present

## 2018-08-31 DIAGNOSIS — D5700 Hb-SS disease with crisis, unspecified: Secondary | ICD-10-CM | POA: Diagnosis present

## 2018-08-31 DIAGNOSIS — G894 Chronic pain syndrome: Secondary | ICD-10-CM | POA: Diagnosis present

## 2018-08-31 DIAGNOSIS — F17210 Nicotine dependence, cigarettes, uncomplicated: Secondary | ICD-10-CM | POA: Diagnosis present

## 2018-08-31 DIAGNOSIS — Z9049 Acquired absence of other specified parts of digestive tract: Secondary | ICD-10-CM | POA: Diagnosis not present

## 2018-08-31 DIAGNOSIS — Z832 Family history of diseases of the blood and blood-forming organs and certain disorders involving the immune mechanism: Secondary | ICD-10-CM | POA: Diagnosis not present

## 2018-08-31 DIAGNOSIS — F1410 Cocaine abuse, uncomplicated: Secondary | ICD-10-CM | POA: Diagnosis present

## 2018-08-31 DIAGNOSIS — Z881 Allergy status to other antibiotic agents status: Secondary | ICD-10-CM | POA: Diagnosis not present

## 2018-08-31 DIAGNOSIS — Z1159 Encounter for screening for other viral diseases: Secondary | ICD-10-CM | POA: Diagnosis not present

## 2018-08-31 LAB — SARS CORONAVIRUS 2 BY RT PCR (HOSPITAL ORDER, PERFORMED IN ~~LOC~~ HOSPITAL LAB): SARS Coronavirus 2: NEGATIVE

## 2018-08-31 MED ORDER — ONDANSETRON HCL 4 MG/2ML IJ SOLN
4.0000 mg | Freq: Once | INTRAMUSCULAR | Status: DC
  Filled 2018-08-31: qty 2

## 2018-08-31 MED ORDER — SODIUM CHLORIDE 0.45 % IV SOLN
INTRAVENOUS | Status: DC
  Administered 2018-08-31: 02:00:00 via INTRAVENOUS

## 2018-08-31 MED ORDER — DIPHENHYDRAMINE HCL 50 MG/ML IJ SOLN
25.0000 mg | Freq: Once | INTRAMUSCULAR | Status: AC
  Administered 2018-08-31: 25 mg via INTRAVENOUS
  Filled 2018-08-31: qty 1

## 2018-08-31 MED ORDER — PROMETHAZINE HCL 25 MG/ML IJ SOLN
25.0000 mg | Freq: Once | INTRAMUSCULAR | Status: AC
  Administered 2018-08-31: 25 mg via INTRAVENOUS
  Filled 2018-08-31: qty 1

## 2018-08-31 MED ORDER — KETOROLAC TROMETHAMINE 30 MG/ML IJ SOLN
30.0000 mg | INTRAMUSCULAR | Status: AC
  Administered 2018-08-31: 30 mg via INTRAVENOUS
  Filled 2018-08-31: qty 1

## 2018-08-31 MED ORDER — HYDROMORPHONE HCL 1 MG/ML IJ SOLN
2.0000 mg | INTRAMUSCULAR | Status: AC
  Administered 2018-08-31 (×2): 2 mg via INTRAVENOUS
  Filled 2018-08-31 (×2): qty 2

## 2018-08-31 MED ORDER — HYDROMORPHONE HCL 1 MG/ML IJ SOLN: 2.0000 mg | INTRAMUSCULAR | Status: AC

## 2018-08-31 MED ORDER — OXYCODONE HCL 30 MG PO TABS: 30.0000 mg | ORAL_TABLET | ORAL | 0 refills | Status: DC | PRN

## 2018-08-31 MED ORDER — HYDROMORPHONE HCL 1 MG/ML IJ SOLN
2.0000 mg | Freq: Once | INTRAMUSCULAR | Status: AC
  Administered 2018-08-31: 2 mg via INTRAVENOUS
  Filled 2018-08-31: qty 2

## 2018-08-31 MED ORDER — HYDROMORPHONE HCL 1 MG/ML IJ SOLN
2.0000 mg | INTRAMUSCULAR | Status: AC
  Administered 2018-08-31: 2 mg via INTRAVENOUS
  Filled 2018-08-31: qty 2

## 2018-08-31 MED ORDER — PROMETHAZINE HCL 25 MG/ML IJ SOLN: 12.5000 mg | Freq: Once | INTRAMUSCULAR | Status: DC

## 2018-08-31 NOTE — ED Notes (Signed)
Chyna NP made aware
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
## 2018-08-31 NOTE — ED Notes (Signed)
Breakfast tray ordered 

## 2018-08-31 NOTE — ED Notes (Signed)
PAGED ADMITTING PER RN  

## 2018-08-31 NOTE — ED Notes (Signed)
Attempted IV x2. 
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
## 2018-08-31 NOTE — ED Notes (Signed)
Went to reassess pt and found room empty with IV on bedside. Table. This RN called pt. Pt stated he left and was going to see his primary care dr. Abbott Pao was informed he was leaving against medical advice and verbalized understanding. MD will be made aware.

## 2018-08-31 NOTE — ED Notes (Signed)
Spoke with [employer]. Will put in transfer orders to [facility]

## 2018-09-03 ENCOUNTER — Encounter (HOSPITAL_COMMUNITY): Payer: Self-pay | Admitting: Emergency Medicine

## 2018-09-03 ENCOUNTER — Emergency Department (HOSPITAL_COMMUNITY)
Admission: EM | Admit: 2018-09-03 | Discharge: 2018-09-03 | Disposition: A | Discharge status: Home / Self Care | Payer: Medicaid Other | Attending: Emergency Medicine | Admitting: Emergency Medicine

## 2018-09-03 ENCOUNTER — Emergency Department (HOSPITAL_COMMUNITY): Payer: Medicaid Other

## 2018-09-03 ENCOUNTER — Emergency Department (HOSPITAL_COMMUNITY)
Admission: EM | Admit: 2018-09-03 | Discharge: 2018-09-04 | Disposition: A | Discharge status: Home / Self Care | Payer: Medicaid Other | Source: Home / Self Care | Attending: Emergency Medicine | Admitting: Emergency Medicine

## 2018-09-03 ENCOUNTER — Other Ambulatory Visit: Payer: Self-pay

## 2018-09-03 DIAGNOSIS — F17210 Nicotine dependence, cigarettes, uncomplicated: Secondary | ICD-10-CM | POA: Insufficient documentation

## 2018-09-03 DIAGNOSIS — G894 Chronic pain syndrome: Secondary | ICD-10-CM | POA: Insufficient documentation

## 2018-09-03 DIAGNOSIS — Z5321 Procedure and treatment not carried out due to patient leaving prior to being seen by health care provider: Secondary | ICD-10-CM | POA: Insufficient documentation

## 2018-09-03 DIAGNOSIS — Z79899 Other long term (current) drug therapy: Secondary | ICD-10-CM | POA: Insufficient documentation

## 2018-09-03 DIAGNOSIS — D5700 Hb-SS disease with crisis, unspecified: Secondary | ICD-10-CM

## 2018-09-03 LAB — COMPREHENSIVE METABOLIC PANEL
ALT: 13 U/L (ref 0–44)
AST: 33 U/L (ref 15–41)
Albumin: 4.4 g/dL (ref 3.5–5.0)
Alkaline Phosphatase: 74 U/L (ref 38–126)
Anion gap: 10 (ref 5–15)
BUN: 7 mg/dL (ref 6–20)
CO2: 23 mmol/L (ref 22–32)
Calcium: 9.3 mg/dL (ref 8.9–10.3)
Chloride: 104 mmol/L (ref 98–111)
Creatinine, Ser: 0.62 mg/dL (ref 0.61–1.24)
GFR calc Af Amer: >60 mL/min (ref >60)
GFR calc non Af Amer: >60 mL/min (ref >60)
Glucose, Bld: 108 mg/dL — ABNORMAL HIGH (ref 70–99)
Potassium: 3.5 mmol/L (ref 3.5–5.1)
Sodium: 137 mmol/L (ref 135–145)
Total Bilirubin: 5.9 mg/dL — ABNORMAL HIGH (ref 0.3–1.2)
Total Protein: 8.4 g/dL — ABNORMAL HIGH (ref 6.5–8.1)

## 2018-09-03 LAB — CBC WITH DIFFERENTIAL/PLATELET
Abs Immature Granulocytes: 0.17 10*3/uL — ABNORMAL HIGH (ref 0.00–0.07)
Basophils Absolute: 0.1 10*3/uL (ref 0.0–0.1)
Basophils Relative: 1 %
Eosinophils Absolute: 1.5 10*3/uL — ABNORMAL HIGH (ref 0.0–0.5)
Eosinophils Relative: 6 %
HCT: 24.5 % — ABNORMAL LOW (ref 39.0–52.0)
Hemoglobin: 8.4 g/dL — ABNORMAL LOW (ref 13.0–17.0)
Immature Granulocytes: 1 %
Lymphocytes Relative: 17 %
Lymphs Abs: 3.9 10*3/uL (ref 0.7–4.0)
MCH: 32.7 pg (ref 26.0–34.0)
MCHC: 34.3 g/dL (ref 30.0–36.0)
MCV: 95.3 fL (ref 80.0–100.0)
Monocytes Absolute: 1.8 10*3/uL — ABNORMAL HIGH (ref 0.1–1.0)
Monocytes Relative: 8 %
Neutro Abs: 16.2 10*3/uL — ABNORMAL HIGH (ref 1.7–7.7)
Neutrophils Relative %: 67 %
Platelets: 318 10*3/uL (ref 150–400)
RBC: 2.57 MIL/uL — ABNORMAL LOW (ref 4.22–5.81)
RDW: 17.8 % — ABNORMAL HIGH (ref 11.5–15.5)
WBC: 23.7 10*3/uL — ABNORMAL HIGH (ref 4.0–10.5)
nRBC: 0.1 % (ref 0.0–0.2)

## 2018-09-03 LAB — TROPONIN I (HIGH SENSITIVITY)
Troponin I (High Sensitivity): 6 ng/L (ref <18)
Troponin I (High Sensitivity): 6 ng/L (ref <18)

## 2018-09-03 LAB — RETICULOCYTES
Immature Retic Fract: 23.5 % — ABNORMAL HIGH (ref 2.3–15.9)
RBC.: 2.57 MIL/uL — ABNORMAL LOW (ref 4.22–5.81)
Retic Count, Absolute: 190.7 10*3/uL — ABNORMAL HIGH (ref 19.0–186.0)
Retic Ct Pct: 7.4 % — ABNORMAL HIGH (ref 0.4–3.1)

## 2018-09-03 MED ORDER — SODIUM CHLORIDE 0.9% FLUSH: 3.0000 mL | Freq: Once | INTRAVENOUS | Status: DC

## 2018-09-03 NOTE — ED Triage Notes (Signed)
Patient with sickle cell pain all over since yesterday.  Patient states that he is having chest pain also.

## 2018-09-03 NOTE — ED Notes (Signed)
Patient has come back wanting to be seen again.

## 2018-09-03 NOTE — ED Notes (Signed)
Per Vanita Ingles, NT pt left.

## 2018-09-03 NOTE — ED Notes (Signed)
Pt approached registration stating that he was leaving and handed over his pt labels. Pt is no longer in the waiting room. RN, Deneise Lever, notified.

## 2018-09-03 NOTE — ED Triage Notes (Signed)
Pt arrives POV for eval of sickle cell pain onset yesterday. Was here this AM, LWBS because "it was too packed".

## 2018-09-04 ENCOUNTER — Encounter (HOSPITAL_COMMUNITY): Payer: Self-pay | Admitting: Emergency Medicine

## 2018-09-04 ENCOUNTER — Emergency Department (HOSPITAL_COMMUNITY): Payer: Medicaid Other

## 2018-09-04 ENCOUNTER — Encounter (HOSPITAL_BASED_OUTPATIENT_CLINIC_OR_DEPARTMENT_OTHER): Payer: Self-pay | Admitting: *Deleted

## 2018-09-04 ENCOUNTER — Emergency Department (HOSPITAL_COMMUNITY)
Admission: EM | Admit: 2018-09-04 | Discharge: 2018-09-05 | Discharge status: Against Medical Advice | Payer: Medicaid Other | Source: Home / Self Care | Attending: Emergency Medicine | Admitting: Emergency Medicine

## 2018-09-04 ENCOUNTER — Emergency Department (HOSPITAL_BASED_OUTPATIENT_CLINIC_OR_DEPARTMENT_OTHER)
Admission: EM | Admit: 2018-09-04 | Discharge: 2018-09-04 | Disposition: A | Discharge status: Home / Self Care | Payer: Medicaid Other | Attending: Emergency Medicine | Admitting: Emergency Medicine

## 2018-09-04 ENCOUNTER — Other Ambulatory Visit: Payer: Self-pay

## 2018-09-04 ENCOUNTER — Emergency Department (HOSPITAL_COMMUNITY)
Admission: EM | Admit: 2018-09-04 | Discharge: 2018-09-04 | Disposition: A | Discharge status: Home / Self Care | Payer: Medicaid Other | Source: Home / Self Care

## 2018-09-04 DIAGNOSIS — Z5320 Procedure and treatment not carried out because of patient's decision for unspecified reasons: Secondary | ICD-10-CM | POA: Insufficient documentation

## 2018-09-04 DIAGNOSIS — Z79899 Other long term (current) drug therapy: Secondary | ICD-10-CM | POA: Insufficient documentation

## 2018-09-04 DIAGNOSIS — D5700 Hb-SS disease with crisis, unspecified: Secondary | ICD-10-CM | POA: Insufficient documentation

## 2018-09-04 DIAGNOSIS — R112 Nausea with vomiting, unspecified: Secondary | ICD-10-CM | POA: Insufficient documentation

## 2018-09-04 DIAGNOSIS — Z5321 Procedure and treatment not carried out due to patient leaving prior to being seen by health care provider: Secondary | ICD-10-CM | POA: Insufficient documentation

## 2018-09-04 DIAGNOSIS — M545 Low back pain: Secondary | ICD-10-CM | POA: Insufficient documentation

## 2018-09-04 DIAGNOSIS — M79605 Pain in left leg: Secondary | ICD-10-CM | POA: Insufficient documentation

## 2018-09-04 DIAGNOSIS — M79604 Pain in right leg: Secondary | ICD-10-CM | POA: Insufficient documentation

## 2018-09-04 DIAGNOSIS — Z765 Malingerer [conscious simulation]: Secondary | ICD-10-CM

## 2018-09-04 DIAGNOSIS — F17210 Nicotine dependence, cigarettes, uncomplicated: Secondary | ICD-10-CM | POA: Insufficient documentation

## 2018-09-04 DIAGNOSIS — M5489 Other dorsalgia: Secondary | ICD-10-CM | POA: Diagnosis present

## 2018-09-04 DIAGNOSIS — M7918 Myalgia, other site: Secondary | ICD-10-CM | POA: Insufficient documentation

## 2018-09-04 LAB — CBC WITH DIFFERENTIAL/PLATELET
Abs Immature Granulocytes: 0.1 10*3/uL — ABNORMAL HIGH (ref 0.00–0.07)
Abs Immature Granulocytes: 0.08 10*3/uL — ABNORMAL HIGH (ref 0.00–0.07)
Basophils Absolute: 0.1 10*3/uL (ref 0.0–0.1)
Basophils Absolute: 0.2 10*3/uL — ABNORMAL HIGH (ref 0.0–0.1)
Basophils Relative: 1 %
Basophils Relative: 1 %
Eosinophils Absolute: 1.3 10*3/uL — ABNORMAL HIGH (ref 0.0–0.5)
Eosinophils Absolute: 1.3 10*3/uL — ABNORMAL HIGH (ref 0.0–0.5)
Eosinophils Relative: 8 %
Eosinophils Relative: 9 %
HCT: 26.1 % — ABNORMAL LOW (ref 39.0–52.0)
HCT: 24.1 % — ABNORMAL LOW (ref 39.0–52.0)
Hemoglobin: 8.9 g/dL — ABNORMAL LOW (ref 13.0–17.0)
Hemoglobin: 8.4 g/dL — ABNORMAL LOW (ref 13.0–17.0)
Immature Granulocytes: 1 %
Immature Granulocytes: 1 %
Lymphocytes Relative: 26 %
Lymphocytes Relative: 28 %
Lymphs Abs: 4.2 10*3/uL — ABNORMAL HIGH (ref 0.7–4.0)
Lymphs Abs: 4.3 10*3/uL — ABNORMAL HIGH (ref 0.7–4.0)
MCH: 32.8 pg (ref 26.0–34.0)
MCH: 32.9 pg (ref 26.0–34.0)
MCHC: 34.1 g/dL (ref 30.0–36.0)
MCHC: 34.9 g/dL (ref 30.0–36.0)
MCV: 96.3 fL (ref 80.0–100.0)
MCV: 94.5 fL (ref 80.0–100.0)
Monocytes Absolute: 1.3 10*3/uL — ABNORMAL HIGH (ref 0.1–1.0)
Monocytes Absolute: 1.1 10*3/uL — ABNORMAL HIGH (ref 0.1–1.0)
Monocytes Relative: 8 %
Monocytes Relative: 7 %
Neutro Abs: 9 10*3/uL — ABNORMAL HIGH (ref 1.7–7.7)
Neutro Abs: 8.5 10*3/uL — ABNORMAL HIGH (ref 1.7–7.7)
Neutrophils Relative %: 56 %
Neutrophils Relative %: 54 %
Platelets: 286 10*3/uL (ref 150–400)
Platelets: 295 10*3/uL (ref 150–400)
RBC: 2.71 MIL/uL — ABNORMAL LOW (ref 4.22–5.81)
RBC: 2.55 MIL/uL — ABNORMAL LOW (ref 4.22–5.81)
RDW: 17.8 % — ABNORMAL HIGH (ref 11.5–15.5)
RDW: 17.5 % — ABNORMAL HIGH (ref 11.5–15.5)
WBC: 16.1 10*3/uL — ABNORMAL HIGH (ref 4.0–10.5)
WBC: 15.4 10*3/uL — ABNORMAL HIGH (ref 4.0–10.5)
nRBC: 0.3 % — ABNORMAL HIGH (ref 0.0–0.2)
nRBC: 0.3 % — ABNORMAL HIGH (ref 0.0–0.2)

## 2018-09-04 LAB — COMPREHENSIVE METABOLIC PANEL
ALT: 16 U/L (ref 0–44)
ALT: 19 U/L (ref 0–44)
AST: 32 U/L (ref 15–41)
AST: 38 U/L (ref 15–41)
Albumin: 4.2 g/dL (ref 3.5–5.0)
Albumin: 4 g/dL (ref 3.5–5.0)
Alkaline Phosphatase: 83 U/L (ref 38–126)
Alkaline Phosphatase: 73 U/L (ref 38–126)
Anion gap: 8 (ref 5–15)
Anion gap: 8 (ref 5–15)
BUN: 7 mg/dL (ref 6–20)
BUN: 9 mg/dL (ref 6–20)
CO2: 24 mmol/L (ref 22–32)
CO2: 24 mmol/L (ref 22–32)
Calcium: 9.1 mg/dL (ref 8.9–10.3)
Calcium: 9 mg/dL (ref 8.9–10.3)
Chloride: 105 mmol/L (ref 98–111)
Chloride: 108 mmol/L (ref 98–111)
Creatinine, Ser: 0.53 mg/dL — ABNORMAL LOW (ref 0.61–1.24)
Creatinine, Ser: 0.8 mg/dL (ref 0.61–1.24)
GFR calc Af Amer: >60 mL/min (ref >60)
GFR calc Af Amer: >60 mL/min (ref >60)
GFR calc non Af Amer: >60 mL/min (ref >60)
GFR calc non Af Amer: >60 mL/min (ref >60)
Glucose, Bld: 120 mg/dL — ABNORMAL HIGH (ref 70–99)
Glucose, Bld: 114 mg/dL — ABNORMAL HIGH (ref 70–99)
Potassium: 3.7 mmol/L (ref 3.5–5.1)
Potassium: 4 mmol/L (ref 3.5–5.1)
Sodium: 137 mmol/L (ref 135–145)
Sodium: 140 mmol/L (ref 135–145)
Total Bilirubin: 4.9 mg/dL — ABNORMAL HIGH (ref 0.3–1.2)
Total Bilirubin: 4.7 mg/dL — ABNORMAL HIGH (ref 0.3–1.2)
Total Protein: 7.5 g/dL (ref 6.5–8.1)
Total Protein: 7.5 g/dL (ref 6.5–8.1)

## 2018-09-04 LAB — RETICULOCYTES
Immature Retic Fract: 24.6 % — ABNORMAL HIGH (ref 2.3–15.9)
Immature Retic Fract: 18.3 % — ABNORMAL HIGH (ref 2.3–15.9)
RBC.: 2.71 MIL/uL — ABNORMAL LOW (ref 4.22–5.81)
RBC.: 2.55 MIL/uL — ABNORMAL LOW (ref 4.22–5.81)
Retic Count, Absolute: 214.1 10*3/uL — ABNORMAL HIGH (ref 19.0–186.0)
Retic Count, Absolute: 180 10*3/uL (ref 19.0–186.0)
Retic Ct Pct: 7.9 % — ABNORMAL HIGH (ref 0.4–3.1)
Retic Ct Pct: 7.1 % — ABNORMAL HIGH (ref 0.4–3.1)

## 2018-09-04 LAB — LIPASE, BLOOD: Lipase: 21 U/L (ref 11–51)

## 2018-09-04 MED ORDER — OXYCODONE HCL 5 MG PO TABS
10.0000 mg | ORAL_TABLET | Freq: Once | ORAL | Status: AC | PRN
  Administered 2018-09-04: 10 mg via ORAL
  Filled 2018-09-04: qty 2

## 2018-09-04 MED ORDER — KETOROLAC TROMETHAMINE 30 MG/ML IJ SOLN
30.0000 mg | INTRAMUSCULAR | Status: AC
  Administered 2018-09-04: 30 mg via INTRAVENOUS
  Filled 2018-09-04: qty 1

## 2018-09-04 MED ORDER — PROMETHAZINE HCL 25 MG/ML IJ SOLN
25.0000 mg | Freq: Once | INTRAMUSCULAR | Status: AC
  Administered 2018-09-04: 25 mg via INTRAMUSCULAR

## 2018-09-04 MED ORDER — OXYCODONE HCL 5 MG PO TABS
20.0000 mg | ORAL_TABLET | Freq: Once | ORAL | Status: AC
  Administered 2018-09-04: 20 mg via ORAL
  Filled 2018-09-04: qty 4

## 2018-09-04 MED ORDER — HYDROMORPHONE HCL 2 MG/ML IJ SOLN
2.0000 mg | INTRAMUSCULAR | Status: AC
  Administered 2018-09-05: 01:00:00 2 mg via SUBCUTANEOUS
  Filled 2018-09-04: qty 1

## 2018-09-04 MED ORDER — SODIUM CHLORIDE 0.9% FLUSH: 3.0000 mL | Freq: Once | INTRAVENOUS | Status: DC

## 2018-09-04 MED ORDER — HYDROMORPHONE HCL 2 MG/ML IJ SOLN: 2.0000 mg | INTRAMUSCULAR | Status: AC

## 2018-09-04 MED ORDER — HYDROMORPHONE HCL 1 MG/ML IJ SOLN
1.0000 mg | Freq: Once | INTRAMUSCULAR | Status: AC
  Administered 2018-09-04: 1 mg via INTRAVENOUS
  Filled 2018-09-04: qty 1

## 2018-09-04 MED ORDER — ONDANSETRON HCL 4 MG/2ML IJ SOLN: 4.0000 mg | INTRAMUSCULAR | Status: DC | PRN

## 2018-09-04 MED ORDER — HYDROMORPHONE HCL 2 MG/ML IJ SOLN: 2.0000 mg | INTRAMUSCULAR | Status: DC

## 2018-09-04 MED ORDER — DEXTROSE-NACL 5-0.45 % IV SOLN: INTRAVENOUS | Status: DC

## 2018-09-04 MED ORDER — DEXTROSE-NACL 5-0.45 % IV SOLN
INTRAVENOUS | Status: DC
  Administered 2018-09-04: 01:00:00 via INTRAVENOUS

## 2018-09-04 MED ORDER — PROMETHAZINE HCL 25 MG/ML IJ SOLN
25.0000 mg | Freq: Once | INTRAMUSCULAR | Status: DC
  Filled 2018-09-04: qty 1

## 2018-09-04 MED ORDER — HYDROMORPHONE HCL 1 MG/ML IJ SOLN
1.0000 mg | Freq: Once | INTRAMUSCULAR | Status: AC
  Administered 2018-09-04: 1 mg via INTRAMUSCULAR
  Filled 2018-09-04: qty 1

## 2018-09-04 MED ORDER — DIPHENHYDRAMINE HCL 25 MG PO CAPS
25.0000 mg | ORAL_CAPSULE | ORAL | Status: DC | PRN
  Administered 2018-09-05: 01:00:00 25 mg via ORAL
  Filled 2018-09-04: qty 1

## 2018-09-04 MED ORDER — OXYCODONE HCL 5 MG PO TABS
20.0000 mg | ORAL_TABLET | Freq: Once | ORAL | Status: AC
  Administered 2018-09-04: 01:00:00 20 mg via ORAL
  Filled 2018-09-04: qty 4

## 2018-09-04 NOTE — ED Triage Notes (Addendum)
C/o general pain due to sickle cell for past day. States he has taken oxycodone last at 3pm yesterday due to nausea. Vomited times 3. No vomiting since arrival to ED.  Denies any fevers. Pt states he has a ride home. Also, states he was seen in the ED yesterday and d/c this morning. States he had no improvement in pain and his grandmother suggested he be seen again.
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
## 2018-09-04 NOTE — ED Notes (Addendum)
Attempt IV times one by this RN and Marisa Hua attempt IV times 2 without success. MD aware and order received for IM phenergan.

## 2018-09-04 NOTE — ED Notes (Signed)
Pt c/o IV site swelling;discontiued IVF. Warm compress applied.
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
## 2018-09-04 NOTE — ED Triage Notes (Signed)
Patient here with nausea and vomiting.  Patient states he is having pain all over.  Patient has been here three days in a row with no relief of his symptoms.  He states that his home meds are not working.

## 2018-09-04 NOTE — ED Notes (Signed)
Pt states pain level is currently 6/10. Pt medicated with 2nd dose of pain medications.States he would prefer to d/c home instead of being admitted. MD updated.
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
## 2018-09-04 NOTE — ED Triage Notes (Signed)
Pt arriving with sickle cell pain crisis. Pt reports the pain is all over but is worse in his lower back. Pt also complaining of nausea and vomiting.

## 2018-09-05 ENCOUNTER — Telehealth: Payer: Self-pay | Admitting: Family Medicine

## 2018-09-05 LAB — CBC WITH DIFFERENTIAL/PLATELET
Abs Immature Granulocytes: 0.11 10*3/uL — ABNORMAL HIGH (ref 0.00–0.07)
Basophils Absolute: 0.2 10*3/uL — ABNORMAL HIGH (ref 0.0–0.1)
Basophils Relative: 1 %
Eosinophils Absolute: 1.5 10*3/uL — ABNORMAL HIGH (ref 0.0–0.5)
Eosinophils Relative: 9 %
HCT: 24 % — ABNORMAL LOW (ref 39.0–52.0)
Hemoglobin: 7.8 g/dL — ABNORMAL LOW (ref 13.0–17.0)
Immature Granulocytes: 1 %
Lymphocytes Relative: 31 %
Lymphs Abs: 5.3 10*3/uL — ABNORMAL HIGH (ref 0.7–4.0)
MCH: 32.1 pg (ref 26.0–34.0)
MCHC: 32.5 g/dL (ref 30.0–36.0)
MCV: 98.8 fL (ref 80.0–100.0)
Monocytes Absolute: 1.4 10*3/uL — ABNORMAL HIGH (ref 0.1–1.0)
Monocytes Relative: 8 %
Neutro Abs: 8.6 10*3/uL — ABNORMAL HIGH (ref 1.7–7.7)
Neutrophils Relative %: 50 %
Platelets: 290 10*3/uL (ref 150–400)
RBC: 2.43 MIL/uL — ABNORMAL LOW (ref 4.22–5.81)
RDW: 17.7 % — ABNORMAL HIGH (ref 11.5–15.5)
WBC: 17.1 10*3/uL — ABNORMAL HIGH (ref 4.0–10.5)
nRBC: 0.2 % (ref 0.0–0.2)

## 2018-09-05 LAB — COMPREHENSIVE METABOLIC PANEL
ALT: 19 U/L (ref 0–44)
AST: 41 U/L (ref 15–41)
Albumin: 4.1 g/dL (ref 3.5–5.0)
Alkaline Phosphatase: 69 U/L (ref 38–126)
Anion gap: 7 (ref 5–15)
BUN: 12 mg/dL (ref 6–20)
CO2: 24 mmol/L (ref 22–32)
Calcium: 8.8 mg/dL — ABNORMAL LOW (ref 8.9–10.3)
Chloride: 108 mmol/L (ref 98–111)
Creatinine, Ser: 0.69 mg/dL (ref 0.61–1.24)
GFR calc Af Amer: >60 mL/min (ref >60)
GFR calc non Af Amer: >60 mL/min (ref >60)
Glucose, Bld: 99 mg/dL (ref 70–99)
Potassium: 3.9 mmol/L (ref 3.5–5.1)
Sodium: 139 mmol/L (ref 135–145)
Total Bilirubin: 4.5 mg/dL — ABNORMAL HIGH (ref 0.3–1.2)
Total Protein: 7.6 g/dL (ref 6.5–8.1)

## 2018-09-05 LAB — RETICULOCYTES
Immature Retic Fract: 20.9 % — ABNORMAL HIGH (ref 2.3–15.9)
RBC.: 2.43 MIL/uL — ABNORMAL LOW (ref 4.22–5.81)
Retic Count, Absolute: 174.5 10*3/uL (ref 19.0–186.0)
Retic Ct Pct: 7.2 % — ABNORMAL HIGH (ref 0.4–3.1)

## 2018-09-05 MED ORDER — ONDANSETRON 4 MG PO TBDP
4.0000 mg | ORAL_TABLET | Freq: Once | ORAL | Status: AC
  Administered 2018-09-05: 4 mg via ORAL
  Filled 2018-09-05: qty 1

## 2018-09-05 NOTE — ED Notes (Signed)
Pt just left  

## 2018-09-06 ENCOUNTER — Emergency Department (HOSPITAL_COMMUNITY)
Admission: EM | Admit: 2018-09-06 | Discharge: 2018-09-06 | Disposition: A | Discharge status: Home / Self Care | Payer: Medicaid Other

## 2018-09-06 ENCOUNTER — Other Ambulatory Visit: Payer: Self-pay | Admitting: Family Medicine

## 2018-09-06 DIAGNOSIS — R110 Nausea: Secondary | ICD-10-CM

## 2018-09-06 DIAGNOSIS — G894 Chronic pain syndrome: Secondary | ICD-10-CM

## 2018-09-06 DIAGNOSIS — D5700 Hb-SS disease with crisis, unspecified: Secondary | ICD-10-CM

## 2018-09-06 MED ORDER — ONDANSETRON HCL 4 MG PO TABS: 4.0000 mg | ORAL_TABLET | Freq: Three times a day (TID) | ORAL | 1 refills | Status: DC | PRN

## 2018-09-06 MED ORDER — OXYCODONE HCL 30 MG PO TABS: 30.0000 mg | ORAL_TABLET | ORAL | 0 refills | Status: DC | PRN

## 2018-09-06 NOTE — Telephone Encounter (Signed)
Sent to provider 

## 2018-09-06 NOTE — Progress Notes (Signed)
  Reviewed PDMP data base prior to prescribing opiate medications. No inconsistencies noted.   Meds ordered this encounter  Medications  . oxycodone (ROXICODONE) 30 MG immediate release tablet    Sig: Take 1 tablet (30 mg total) by mouth every 4 (four) hours as needed for up to 15 days for pain.    Dispense:  60 tablet    Refill:  0    Order Specific Question:   Supervising Provider    Answer:   Tresa Garter [id number]  . ondansetron (ZOFRAN) 4 MG tablet    Sig: Take 1 tablet (4 mg total) by mouth every 8 (eight) hours as needed for nausea or vomiting.    Dispense:  30 tablet    Refill:  1    Order Specific Question:   Supervising Provider    Answer:   Tresa Garter [[id number]]     Donia Pounds  APRN, MSN, FNP-C [facility] [address] [place], [place] [address] [phone]

## 2018-09-11 ENCOUNTER — Encounter (HOSPITAL_COMMUNITY): Payer: Self-pay

## 2018-09-11 ENCOUNTER — Other Ambulatory Visit: Payer: Self-pay

## 2018-09-11 ENCOUNTER — Encounter (HOSPITAL_BASED_OUTPATIENT_CLINIC_OR_DEPARTMENT_OTHER): Payer: Self-pay | Admitting: Emergency Medicine

## 2018-09-11 ENCOUNTER — Emergency Department (HOSPITAL_COMMUNITY)
Admission: EM | Admit: 2018-09-11 | Discharge: 2018-09-11 | Disposition: A | Discharge status: Home / Self Care | Payer: Medicaid Other | Source: Home / Self Care | Attending: Emergency Medicine | Admitting: Emergency Medicine

## 2018-09-11 ENCOUNTER — Encounter (HOSPITAL_COMMUNITY): Payer: Self-pay | Admitting: Emergency Medicine

## 2018-09-11 ENCOUNTER — Emergency Department (HOSPITAL_BASED_OUTPATIENT_CLINIC_OR_DEPARTMENT_OTHER)
Admission: EM | Admit: 2018-09-11 | Discharge: 2018-09-11 | Disposition: A | Discharge status: Home / Self Care | Payer: Medicaid Other | Source: Home / Self Care | Attending: Emergency Medicine | Admitting: Emergency Medicine

## 2018-09-11 DIAGNOSIS — Z5321 Procedure and treatment not carried out due to patient leaving prior to being seen by health care provider: Secondary | ICD-10-CM | POA: Insufficient documentation

## 2018-09-11 DIAGNOSIS — D5700 Hb-SS disease with crisis, unspecified: Secondary | ICD-10-CM

## 2018-09-11 DIAGNOSIS — G894 Chronic pain syndrome: Secondary | ICD-10-CM | POA: Insufficient documentation

## 2018-09-11 DIAGNOSIS — M5489 Other dorsalgia: Secondary | ICD-10-CM | POA: Insufficient documentation

## 2018-09-11 DIAGNOSIS — Z79899 Other long term (current) drug therapy: Secondary | ICD-10-CM | POA: Insufficient documentation

## 2018-09-11 DIAGNOSIS — R1110 Vomiting, unspecified: Secondary | ICD-10-CM | POA: Insufficient documentation

## 2018-09-11 DIAGNOSIS — F17210 Nicotine dependence, cigarettes, uncomplicated: Secondary | ICD-10-CM | POA: Insufficient documentation

## 2018-09-11 LAB — CBC WITH DIFFERENTIAL/PLATELET
Abs Immature Granulocytes: 0.08 10*3/uL — ABNORMAL HIGH (ref 0.00–0.07)
Abs Immature Granulocytes: 0.11 10*3/uL — ABNORMAL HIGH (ref 0.00–0.07)
Basophils Absolute: 0.1 10*3/uL (ref 0.0–0.1)
Basophils Absolute: 0.1 10*3/uL (ref 0.0–0.1)
Basophils Relative: 1 %
Basophils Relative: 1 %
Eosinophils Absolute: 0.5 10*3/uL (ref 0.0–0.5)
Eosinophils Absolute: 0.7 10*3/uL — ABNORMAL HIGH (ref 0.0–0.5)
Eosinophils Relative: 4 %
Eosinophils Relative: 5 %
HCT: 27.7 % — ABNORMAL LOW (ref 39.0–52.0)
HCT: 25.9 % — ABNORMAL LOW (ref 39.0–52.0)
Hemoglobin: 8.9 g/dL — ABNORMAL LOW (ref 13.0–17.0)
Hemoglobin: 9.1 g/dL — ABNORMAL LOW (ref 13.0–17.0)
Immature Granulocytes: 1 %
Immature Granulocytes: 1 %
Lymphocytes Relative: 23 %
Lymphocytes Relative: 24 %
Lymphs Abs: 2.9 10*3/uL (ref 0.7–4.0)
Lymphs Abs: 3.4 10*3/uL (ref 0.7–4.0)
MCH: 33.1 pg (ref 26.0–34.0)
MCH: 33.1 pg (ref 26.0–34.0)
MCHC: 32.1 g/dL (ref 30.0–36.0)
MCHC: 35.1 g/dL (ref 30.0–36.0)
MCV: 103 fL — ABNORMAL HIGH (ref 80.0–100.0)
MCV: 94.2 fL (ref 80.0–100.0)
Monocytes Absolute: 1.2 10*3/uL — ABNORMAL HIGH (ref 0.1–1.0)
Monocytes Absolute: 1.4 10*3/uL — ABNORMAL HIGH (ref 0.1–1.0)
Monocytes Relative: 10 %
Monocytes Relative: 10 %
Neutro Abs: 7.7 10*3/uL (ref 1.7–7.7)
Neutro Abs: 8.5 10*3/uL — ABNORMAL HIGH (ref 1.7–7.7)
Neutrophils Relative %: 61 %
Neutrophils Relative %: 59 %
Platelets: 114 10*3/uL — ABNORMAL LOW (ref 150–400)
Platelets: 287 10*3/uL (ref 150–400)
RBC: 2.69 MIL/uL — ABNORMAL LOW (ref 4.22–5.81)
RBC: 2.75 MIL/uL — ABNORMAL LOW (ref 4.22–5.81)
RDW: 18.8 % — ABNORMAL HIGH (ref 11.5–15.5)
RDW: 17.9 % — ABNORMAL HIGH (ref 11.5–15.5)
WBC: 12.5 10*3/uL — ABNORMAL HIGH (ref 4.0–10.5)
WBC: 14.1 10*3/uL — ABNORMAL HIGH (ref 4.0–10.5)
nRBC: 0.4 % — ABNORMAL HIGH (ref 0.0–0.2)
nRBC: 0.4 % — ABNORMAL HIGH (ref 0.0–0.2)

## 2018-09-11 LAB — RETICULOCYTES
Immature Retic Fract: 19.1 % — ABNORMAL HIGH (ref 2.3–15.9)
Immature Retic Fract: 21.4 % — ABNORMAL HIGH (ref 2.3–15.9)
RBC.: 2.69 MIL/uL — ABNORMAL LOW (ref 4.22–5.81)
RBC.: 2.75 MIL/uL — ABNORMAL LOW (ref 4.22–5.81)
Retic Count, Absolute: 225.5 10*3/uL — ABNORMAL HIGH (ref 19.0–186.0)
Retic Count, Absolute: 256.3 10*3/uL — ABNORMAL HIGH (ref 19.0–186.0)
Retic Ct Pct: 9.2 % — ABNORMAL HIGH (ref 0.4–3.1)
Retic Ct Pct: 9.3 % — ABNORMAL HIGH (ref 0.4–3.1)

## 2018-09-11 LAB — COMPREHENSIVE METABOLIC PANEL
ALT: 21 U/L (ref 0–44)
ALT: 19 U/L (ref 0–44)
AST: 49 U/L — ABNORMAL HIGH (ref 15–41)
AST: 46 U/L — ABNORMAL HIGH (ref 15–41)
Albumin: 4.5 g/dL (ref 3.5–5.0)
Albumin: 4.4 g/dL (ref 3.5–5.0)
Alkaline Phosphatase: 73 U/L (ref 38–126)
Alkaline Phosphatase: 71 U/L (ref 38–126)
Anion gap: 10 (ref 5–15)
Anion gap: 10 (ref 5–15)
BUN: 8 mg/dL (ref 6–20)
BUN: 7 mg/dL (ref 6–20)
CO2: 24 mmol/L (ref 22–32)
CO2: 22 mmol/L (ref 22–32)
Calcium: 9.3 mg/dL (ref 8.9–10.3)
Calcium: 9.2 mg/dL (ref 8.9–10.3)
Chloride: 106 mmol/L (ref 98–111)
Chloride: 103 mmol/L (ref 98–111)
Creatinine, Ser: 0.66 mg/dL (ref 0.61–1.24)
Creatinine, Ser: 0.64 mg/dL (ref 0.61–1.24)
GFR calc Af Amer: >60 mL/min (ref >60)
GFR calc Af Amer: >60 mL/min (ref >60)
GFR calc non Af Amer: >60 mL/min (ref >60)
GFR calc non Af Amer: >60 mL/min (ref >60)
Glucose, Bld: 96 mg/dL (ref 70–99)
Glucose, Bld: 123 mg/dL — ABNORMAL HIGH (ref 70–99)
Potassium: 4.4 mmol/L (ref 3.5–5.1)
Potassium: 4.1 mmol/L (ref 3.5–5.1)
Sodium: 140 mmol/L (ref 135–145)
Sodium: 135 mmol/L (ref 135–145)
Total Bilirubin: 3.9 mg/dL — ABNORMAL HIGH (ref 0.3–1.2)
Total Bilirubin: 4.1 mg/dL — ABNORMAL HIGH (ref 0.3–1.2)
Total Protein: 8.3 g/dL — ABNORMAL HIGH (ref 6.5–8.1)
Total Protein: 8.1 g/dL (ref 6.5–8.1)

## 2018-09-11 LAB — TYPE AND SCREEN
ABO/RH(D): O POS
Antibody Screen: NEGATIVE

## 2018-09-11 MED ORDER — KETOROLAC TROMETHAMINE 30 MG/ML IJ SOLN
30.0000 mg | Freq: Once | INTRAMUSCULAR | Status: AC
  Administered 2018-09-11: 30 mg via INTRAVENOUS
  Filled 2018-09-11: qty 1

## 2018-09-11 MED ORDER — SODIUM CHLORIDE 0.9% FLUSH: 3.0000 mL | Freq: Once | INTRAVENOUS | Status: DC

## 2018-09-11 MED ORDER — ONDANSETRON 4 MG PO TBDP
4.0000 mg | ORAL_TABLET | Freq: Once | ORAL | Status: AC
  Administered 2018-09-11: 4 mg via ORAL
  Filled 2018-09-11: qty 1

## 2018-09-11 MED ORDER — OXYCODONE HCL 5 MG PO TABS
30.0000 mg | ORAL_TABLET | Freq: Once | ORAL | Status: AC
  Administered 2018-09-11: 20:00:00 30 mg via ORAL
  Filled 2018-09-11: qty 6

## 2018-09-11 MED ORDER — HYDROMORPHONE HCL 1 MG/ML IJ SOLN
2.0000 mg | Freq: Once | INTRAMUSCULAR | Status: AC
  Administered 2018-09-11: 2 mg via SUBCUTANEOUS
  Filled 2018-09-11: qty 2

## 2018-09-11 MED ORDER — ONDANSETRON 8 MG PO TBDP
8.0000 mg | ORAL_TABLET | Freq: Once | ORAL | Status: AC
  Administered 2018-09-11: 8 mg via ORAL
  Filled 2018-09-11: qty 1

## 2018-09-11 NOTE — ED Triage Notes (Signed)
C/o sickle cell pain crisis. Pain is all over with N/V

## 2018-09-11 NOTE — ED Notes (Signed)
Pt denies headache, denies fever

## 2018-09-11 NOTE — Discharge Instructions (Signed)
It was our pleasure to provide your ER care today - we hope that you feel better.  Rest. Drink plenty of fluids. Take your pain medication as need.   Follow up with your doctors/sickle cell clinic tomorrow.   Return to ER if worse, new symptoms, fevers, trouble breathing, persistent vomiting, or other concern.   You were given pain medication in the ER - no driving for the next 6 hours, or if/when taking opiate pain medication.
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
## 2018-09-11 NOTE — ED Notes (Signed)
Called for pt. Pt not in lobby, restroom, outside lobby.

## 2018-09-11 NOTE — ED Triage Notes (Signed)
Pt BIBA from parking lot. Pt c/o sickle cell pain in lower back.    VSS with EMS

## 2018-09-11 NOTE — ED Triage Notes (Signed)
Onset 2-3 days ago sickle cell pain with nausea and vomiting.

## 2018-09-11 NOTE — ED Provider Notes (Addendum)
Patient placed in Quick Look pathway, seen and evaluated   Chief Complaint: sspc  HPI:   Patient needs transfusion about every 3 months, feels anemic. He feels like his hydroxyurea is no longer working. Having all over back pain withuot urinry sxs. Typical of his ss crisis. 4 episodes of vomiting. Seen yesterday at [facility] ED  ROS:  bp no fevers (one)  Physical Exam:   Gen: No distress  Neuro: Awake and Alert  Skin: Warm    Focused Exam: appears uncomfortable.   Initiation of care has begun. The patient has been counseled on the process, plan, and necessity for staying for the completion/evaluation, and the remainder of the medical screening examination    Margarita Mail, PA-C 09/11/18 [place], Sheldon, PA-C 09/11/18 1516    Dorie Rank, MD 09/12/18 [phone]

## 2018-09-12 ENCOUNTER — Emergency Department (HOSPITAL_COMMUNITY)
Admission: EM | Admit: 2018-09-12 | Discharge: 2018-09-12 | Disposition: A | Discharge status: Home / Self Care | Payer: Medicaid Other | Source: Home / Self Care | Attending: Emergency Medicine | Admitting: Emergency Medicine

## 2018-09-12 ENCOUNTER — Encounter (HOSPITAL_COMMUNITY): Payer: Self-pay | Admitting: Emergency Medicine

## 2018-09-12 ENCOUNTER — Inpatient Hospital Stay (HOSPITAL_COMMUNITY): Payer: Medicaid Other

## 2018-09-12 ENCOUNTER — Telehealth (HOSPITAL_COMMUNITY): Payer: Self-pay

## 2018-09-12 ENCOUNTER — Encounter (HOSPITAL_COMMUNITY): Payer: Self-pay

## 2018-09-12 ENCOUNTER — Inpatient Hospital Stay (HOSPITAL_COMMUNITY)
Admission: AD | Admit: 2018-09-12 | Discharge: 2018-09-15 | DRG: 811 | Disposition: A | Discharge status: Home / Self Care | Payer: Medicaid Other | Source: Ambulatory Visit | Attending: Internal Medicine | Admitting: Internal Medicine

## 2018-09-12 DIAGNOSIS — Z79899 Other long term (current) drug therapy: Secondary | ICD-10-CM | POA: Diagnosis not present

## 2018-09-12 DIAGNOSIS — G894 Chronic pain syndrome: Secondary | ICD-10-CM | POA: Diagnosis present

## 2018-09-12 DIAGNOSIS — R05 Cough: Secondary | ICD-10-CM

## 2018-09-12 DIAGNOSIS — F1190 Opioid use, unspecified, uncomplicated: Secondary | ICD-10-CM | POA: Diagnosis not present

## 2018-09-12 DIAGNOSIS — Z888 Allergy status to other drugs, medicaments and biological substances status: Secondary | ICD-10-CM | POA: Diagnosis not present

## 2018-09-12 DIAGNOSIS — Z791 Long term (current) use of non-steroidal anti-inflammatories (NSAID): Secondary | ICD-10-CM | POA: Diagnosis not present

## 2018-09-12 DIAGNOSIS — Z20828 Contact with and (suspected) exposure to other viral communicable diseases: Secondary | ICD-10-CM | POA: Diagnosis present

## 2018-09-12 DIAGNOSIS — Z765 Malingerer [conscious simulation]: Secondary | ICD-10-CM

## 2018-09-12 DIAGNOSIS — R059 Cough, unspecified: Secondary | ICD-10-CM

## 2018-09-12 DIAGNOSIS — Z833 Family history of diabetes mellitus: Secondary | ICD-10-CM

## 2018-09-12 DIAGNOSIS — D5700 Hb-SS disease with crisis, unspecified: Principal | ICD-10-CM

## 2018-09-12 DIAGNOSIS — Z832 Family history of diseases of the blood and blood-forming organs and certain disorders involving the immune mechanism: Secondary | ICD-10-CM

## 2018-09-12 DIAGNOSIS — Z825 Family history of asthma and other chronic lower respiratory diseases: Secondary | ICD-10-CM

## 2018-09-12 DIAGNOSIS — F17209 Nicotine dependence, unspecified, with unspecified nicotine-induced disorders: Secondary | ICD-10-CM

## 2018-09-12 DIAGNOSIS — F17210 Nicotine dependence, cigarettes, uncomplicated: Secondary | ICD-10-CM | POA: Diagnosis present

## 2018-09-12 DIAGNOSIS — J189 Pneumonia, unspecified organism: Secondary | ICD-10-CM | POA: Diagnosis present

## 2018-09-12 DIAGNOSIS — Z79891 Long term (current) use of opiate analgesic: Secondary | ICD-10-CM

## 2018-09-12 DIAGNOSIS — Z9049 Acquired absence of other specified parts of digestive tract: Secondary | ICD-10-CM | POA: Diagnosis not present

## 2018-09-12 DIAGNOSIS — G8929 Other chronic pain: Secondary | ICD-10-CM | POA: Diagnosis present

## 2018-09-12 LAB — SARS CORONAVIRUS 2 BY RT PCR (HOSPITAL ORDER, PERFORMED IN ~~LOC~~ HOSPITAL LAB): SARS Coronavirus 2: NEGATIVE

## 2018-09-12 MED ORDER — OXYCODONE HCL 5 MG PO TABS
30.0000 mg | ORAL_TABLET | ORAL | Status: DC | PRN
  Administered 2018-09-12 – 2018-09-15 (×10): 30 mg via ORAL
  Filled 2018-09-12 (×12): qty 6

## 2018-09-12 MED ORDER — SENNOSIDES-DOCUSATE SODIUM 8.6-50 MG PO TABS
1.0000 | ORAL_TABLET | Freq: Two times a day (BID) | ORAL | Status: DC
  Filled 2018-09-12 (×2): qty 1

## 2018-09-12 MED ORDER — HYDROMORPHONE 1 MG/ML IV SOLN
INTRAVENOUS | Status: DC
  Administered 2018-09-12: 22:00:00 via INTRAVENOUS
  Administered 2018-09-12: 7.5 mg via INTRAVENOUS
  Administered 2018-09-12: 30 mg via INTRAVENOUS
  Administered 2018-09-13: 5.5 mg via INTRAVENOUS
  Administered 2018-09-13: 12 mg via INTRAVENOUS
  Administered 2018-09-13 (×2): 6 mg via INTRAVENOUS
  Administered 2018-09-13: 5 mg via INTRAVENOUS
  Administered 2018-09-13: 7 mg via INTRAVENOUS
  Administered 2018-09-14: 3 mg via INTRAVENOUS
  Administered 2018-09-14: 5.5 mg via INTRAVENOUS
  Administered 2018-09-14: 10 mg via INTRAVENOUS
  Administered 2018-09-14: 3.5 mg via INTRAVENOUS
  Administered 2018-09-14: 8 mg via INTRAVENOUS
  Administered 2018-09-14: 7.5 mg via INTRAVENOUS
  Administered 2018-09-14: via INTRAVENOUS
  Administered 2018-09-14: 9.5 mg via INTRAVENOUS
  Administered 2018-09-14: 30 mg via INTRAVENOUS
  Administered 2018-09-15: 12 mg via INTRAVENOUS
  Administered 2018-09-15: 5 mg via INTRAVENOUS
  Filled 2018-09-12 (×5): qty 30

## 2018-09-12 MED ORDER — ONDANSETRON HCL 4 MG/2ML IJ SOLN
4.0000 mg | Freq: Four times a day (QID) | INTRAMUSCULAR | Status: DC | PRN
  Administered 2018-09-12 – 2018-09-15 (×3): 4 mg via INTRAVENOUS
  Filled 2018-09-12 (×3): qty 2

## 2018-09-12 MED ORDER — ENOXAPARIN SODIUM 40 MG/0.4ML ~~LOC~~ SOLN: 40.0000 mg | SUBCUTANEOUS | Status: DC

## 2018-09-12 MED ORDER — KETOROLAC TROMETHAMINE 15 MG/ML IJ SOLN
15.0000 mg | Freq: Four times a day (QID) | INTRAMUSCULAR | Status: DC
  Administered 2018-09-12 – 2018-09-15 (×13): 15 mg via INTRAVENOUS
  Filled 2018-09-12 (×13): qty 1

## 2018-09-12 MED ORDER — DIPHENHYDRAMINE HCL 25 MG PO CAPS
25.0000 mg | ORAL_CAPSULE | ORAL | Status: DC | PRN
  Administered 2018-09-12: 10:00:00 25 mg via ORAL
  Filled 2018-09-12: qty 1

## 2018-09-12 MED ORDER — POLYETHYLENE GLYCOL 3350 17 G PO PACK: 17.0000 g | PACK | Freq: Every day | ORAL | Status: DC | PRN

## 2018-09-12 MED ORDER — SODIUM CHLORIDE 0.9% FLUSH: 3.0000 mL | Freq: Once | INTRAVENOUS | Status: DC

## 2018-09-12 MED ORDER — DEXTROSE-NACL 5-0.45 % IV SOLN
INTRAVENOUS | Status: DC
  Administered 2018-09-12 – 2018-09-15 (×9): via INTRAVENOUS

## 2018-09-12 MED ORDER — HYDROXYUREA 500 MG PO CAPS
1000.0000 mg | ORAL_CAPSULE | Freq: Every day | ORAL | Status: DC
  Administered 2018-09-12 – 2018-09-15 (×4): 1000 mg via ORAL
  Filled 2018-09-12 (×4): qty 2

## 2018-09-12 MED ORDER — SODIUM CHLORIDE 0.9% FLUSH: 9.0000 mL | INTRAVENOUS | Status: DC | PRN

## 2018-09-12 MED ORDER — NALOXONE HCL 0.4 MG/ML IJ SOLN: 0.4000 mg | INTRAMUSCULAR | Status: DC | PRN

## 2018-09-12 MED ORDER — HYDROMORPHONE HCL 1 MG/ML IJ SOLN
1.0000 mg | Freq: Once | INTRAMUSCULAR | Status: AC
  Administered 2018-09-12: 02:00:00 1 mg via SUBCUTANEOUS
  Filled 2018-09-12: qty 1

## 2018-09-12 MED ORDER — SODIUM CHLORIDE 0.9 % IV SOLN
25.0000 mg | INTRAVENOUS | Status: DC | PRN
  Filled 2018-09-12: qty 0.5

## 2018-09-12 MED ORDER — FOLIC ACID 1 MG PO TABS
1.0000 mg | ORAL_TABLET | Freq: Every day | ORAL | Status: DC
  Administered 2018-09-13 – 2018-09-15 (×3): 1 mg via ORAL
  Filled 2018-09-12 (×3): qty 1
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
## 2018-09-12 NOTE — Progress Notes (Signed)
Patient admitted to day hospital with complaints of sickle cell pain crisis. Patient reported generalized pain, primarily back pain 8/10. For pain management patient placed on a Dilaudid PCA, and received IVF, IV Toradol,  IV Zofran and PO Benadryl. Patient pain has not resolved and remains at 7/10. Patient will be admitted to the hospital. Report given to [facility], [place].

## 2018-09-12 NOTE — ED Notes (Addendum)
Will discharge 30 min after last dilaudid injection, per protocol and Claiborne Billings PA

## 2018-09-12 NOTE — Progress Notes (Signed)
VAST consulted to place IV. After assessing pt's right arm with [place], asked pt if he had spoken with his physician about getting an implanted port placed. He stated he had called but no one had returned his call. He asked if he could have port placed today. Educated it is a procedure that needs to be discussed first and then scheduled. Advised he should try to contact his physician again to schedule a consultation. Pt has very poor vasculature in his lower right arm, which he stated is his better arm for access. Given his sickle cell diagnosis and limited vasculature for available access, recommend discussion about implanted port placement with physician.

## 2018-09-12 NOTE — Telephone Encounter (Signed)
Matthew Shannon arrived to the Day hospital via ED. Patient have complaints of sickle cell pain and rates it 8/10.  Patient says pain is generalized, primarily in his back and also c/o nausea and vomiting.  Last medication taken was Oxycodone 30 mg on 8/2 at 8 pm.  When asked how come he did not take medication this morning, his reply was it was not working.  Patient denies fever, chest pain, diarrhea, abdominal pain, priapism and COVID-19 symptoms or being around anyone with symptoms.  Chart reveiwed with provider  and patient has been in the ED several times this week. Provided advised for patient to be admitted to the [facility] for treatment.
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
## 2018-09-12 NOTE — ED Triage Notes (Signed)
Patient here from [facility] complaining of sickle cell pain crisis. Patient has been to multiple hospitals with no relief.
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
## 2018-09-13 DIAGNOSIS — R05 Cough: Secondary | ICD-10-CM

## 2018-09-13 DIAGNOSIS — G894 Chronic pain syndrome: Secondary | ICD-10-CM

## 2018-09-13 DIAGNOSIS — R059 Cough, unspecified: Secondary | ICD-10-CM

## 2018-09-13 LAB — CBC
HCT: 21.7 % — ABNORMAL LOW (ref 39.0–52.0)
Hemoglobin: 7.7 g/dL — ABNORMAL LOW (ref 13.0–17.0)
MCH: 33.5 pg (ref 26.0–34.0)
MCHC: 35.5 g/dL (ref 30.0–36.0)
MCV: 94.3 fL (ref 80.0–100.0)
Platelets: 223 10*3/uL (ref 150–400)
RBC: 2.3 MIL/uL — ABNORMAL LOW (ref 4.22–5.81)
RDW: 17.7 % — ABNORMAL HIGH (ref 11.5–15.5)
WBC: 12.5 10*3/uL — ABNORMAL HIGH (ref 4.0–10.5)
nRBC: 0.2 % (ref 0.0–0.2)

## 2018-09-13 LAB — RAPID URINE DRUG SCREEN, HOSP PERFORMED
Amphetamines: NOT DETECTED
Barbiturates: NOT DETECTED
Benzodiazepines: NOT DETECTED
Cocaine: NOT DETECTED
Opiates: POSITIVE — AB
Tetrahydrocannabinol: NOT DETECTED

## 2018-09-13 LAB — COMPREHENSIVE METABOLIC PANEL
ALT: 17 U/L (ref 0–44)
AST: 36 U/L (ref 15–41)
Albumin: 3.7 g/dL (ref 3.5–5.0)
Alkaline Phosphatase: 67 U/L (ref 38–126)
Anion gap: 5 (ref 5–15)
BUN: 9 mg/dL (ref 6–20)
CO2: 24 mmol/L (ref 22–32)
Calcium: 8.8 mg/dL — ABNORMAL LOW (ref 8.9–10.3)
Chloride: 108 mmol/L (ref 98–111)
Creatinine, Ser: 0.41 mg/dL — ABNORMAL LOW (ref 0.61–1.24)
GFR calc Af Amer: >60 mL/min (ref >60)
GFR calc non Af Amer: >60 mL/min (ref >60)
Glucose, Bld: 100 mg/dL — ABNORMAL HIGH (ref 70–99)
Potassium: 4.1 mmol/L (ref 3.5–5.1)
Sodium: 137 mmol/L (ref 135–145)
Total Bilirubin: 4.3 mg/dL — ABNORMAL HIGH (ref 0.3–1.2)
Total Protein: 6.7 g/dL (ref 6.5–8.1)

## 2018-09-13 MED ORDER — NICOTINE 21 MG/24HR TD PT24
21.0000 mg | MEDICATED_PATCH | Freq: Every day | TRANSDERMAL | Status: DC
  Administered 2018-09-13 – 2018-09-15 (×3): 21 mg via TRANSDERMAL
  Filled 2018-09-13 (×4): qty 1

## 2018-09-13 MED ORDER — LEVOFLOXACIN IN D5W 750 MG/150ML IV SOLN
750.0000 mg | INTRAVENOUS | Status: DC
  Administered 2018-09-13: 750 mg via INTRAVENOUS
  Filled 2018-09-13 (×2): qty 150

## 2018-09-13 MED ORDER — SODIUM CHLORIDE 0.9 % IV SOLN
500.0000 mg | INTRAVENOUS | Status: DC
  Filled 2018-09-13: qty 500

## 2018-09-13 NOTE — Progress Notes (Signed)
Patient ID: Matthew HarrisonBobby Shannon, male   DOB: 02/03/1991, 28 y.o.   MRN: [id number] Subjective: Matthew Shannon  is a 28 y.o. malewith history of sickle cell disease, hemoglobin SS, chronic pain syndrome, polysubstance abuse including cocaine use disorder, drug-seeking behavior and tobacco abuse admitted yesterday for sickle cell pain crisis.  Today he is still having significant pain, 7/10.  Goal is 3/10.  Pain is localized to his lower extremities and lower back.  No joint swelling or redness.  Patient denies any fever, chest pain, shortness of breath, nausea, vomiting or diarrhea  Objective:  Vital signs in last 24 hours:  Vitals:   09/13/18 0503 09/13/18 0752 09/13/18 1205 09/13/18 1300  BP: (!) 99/58   (!) 100/49  Pulse: (!) 58   63  Resp: 16 14 12 18   Temp: (!) 97.5 F (36.4 C)   98.1 F (36.7 C)  TempSrc:    Oral  SpO2: 97% 96% 93% 96%  Weight: 57.1 kg       Intake/Output from previous day:   Intake/Output Summary (Last 24 hours) at 09/13/2018 1327 Last data filed at 09/13/2018 1050 Gross per 24 hour  Intake 2122.97 ml  Output 450 ml  Net 1672.97 ml    Physical Exam: General: Alert, awake, oriented x3, in no acute distress.  HEENT: [place]/AT PEERL, EOMI Neck: Trachea midline,  no masses, no thyromegal,y no JVD, no carotid bruit OROPHARYNX:  Moist, No exudate/ erythema/lesions.  Heart: Regular rate and rhythm, without murmurs, rubs, gallops, PMI non-displaced, no heaves or thrills on palpation.  Lungs: Clear to auscultation, no wheezing or rhonchi noted. No increased vocal fremitus resonant to percussion  Abdomen: Soft, nontender, nondistended, positive bowel sounds, no masses no hepatosplenomegaly noted..  Neuro: No focal neurological deficits noted cranial nerves II through XII grossly intact. DTRs 2+ bilaterally upper and lower extremities. Strength 5 out of 5 in bilateral upper and lower extremities. Musculoskeletal: No warm swelling or erythema around joints, no spinal tenderness  noted. Psychiatric: Patient alert and oriented x3, good insight and cognition, good recent to remote recall. Lymph node survey: No cervical axillary or inguinal lymphadenopathy noted.  Lab Results:  Basic Metabolic Panel:    Component Value Date/Time   NA 137 09/13/2018 0516   NA 136 08/16/2018 1123   K 4.1 09/13/2018 0516   CL 108 09/13/2018 0516   CO2 24 09/13/2018 0516   BUN 9 09/13/2018 0516   BUN 11 08/16/2018 1123   CREATININE 0.41 (L) 09/13/2018 0516   GLUCOSE 100 (H) 09/13/2018 0516   CALCIUM 8.8 (L) 09/13/2018 0516   CBC:    Component Value Date/Time   WBC 12.5 (H) 09/13/2018 0516   HGB 7.7 (L) 09/13/2018 0516   HGB 8.9 (L) 08/16/2018 1123   HCT 21.7 (L) 09/13/2018 0516   HCT 26.9 (L) 08/16/2018 1123   PLT 223 09/13/2018 0516   PLT 370 08/16/2018 1123   MCV 94.3 09/13/2018 0516   MCV 99 (H) 08/16/2018 1123   NEUTROABS 8.5 (H) 09/11/2018 1902   NEUTROABS 10.2 (H) 08/16/2018 1123   LYMPHSABS 3.4 09/11/2018 1902   LYMPHSABS 2.8 08/16/2018 1123   MONOABS 1.4 (H) 09/11/2018 1902   EOSABS 0.7 (H) 09/11/2018 1902   EOSABS 0.5 (H) 08/16/2018 1123   BASOSABS 0.1 09/11/2018 1902   BASOSABS 0.1 08/16/2018 1123    Recent Results (from the past 240 hour(s))  SARS Coronavirus 2 [facility](Hospital order, [facility])     Status: None   Collection  Time: 09/12/18  8:11 PM  Result Value Ref Range Status   SARS Coronavirus 2 NEGATIVE NEGATIVE Final    Comment: (NOTE) If result is NEGATIVE SARS-CoV-2 target nucleic acids are NOT DETECTED. The SARS-CoV-2 RNA is generally detectable in upper and lower  respiratory specimens during the acute phase of infection. The lowest  concentration of SARS-CoV-2 viral copies this assay can detect is 250  copies / mL. A negative result does not preclude SARS-CoV-2 infection  and should not be used as the sole basis for treatment or other  patient management decisions.  A negative result may occur with  improper specimen  collection / handling, submission of specimen other  than nasopharyngeal swab, presence of viral mutation(s) within the  areas targeted by this assay, and inadequate number of viral copies  (<250 copies / mL). A negative result must be combined with clinical  observations, patient history, and epidemiological information. If result is POSITIVE SARS-CoV-2 target nucleic acids are DETECTED. The SARS-CoV-2 RNA is generally detectable in upper and lower  respiratory specimens dur ing the acute phase of infection.  Positive  results are indicative of active infection with SARS-CoV-2.  Clinical  correlation with patient history and other diagnostic information is  necessary to determine patient infection status.  Positive results do  not rule out bacterial infection or co-infection with other viruses. If result is PRESUMPTIVE POSTIVE SARS-CoV-2 nucleic acids MAY BE PRESENT.   A presumptive positive result was obtained on the submitted specimen  and confirmed on repeat testing.  While 2019 novel coronavirus  (SARS-CoV-2) nucleic acids may be present in the submitted sample  additional confirmatory testing may be necessary for epidemiological  and / or clinical management purposes  to differentiate between  SARS-CoV-2 and other Sarbecovirus currently known to infect humans.  If clinically indicated additional testing with an alternate test  methodology [phone]) is advised. The SARS-CoV-2 RNA is generally  detectable in upper and lower respiratory sp ecimens during the acute  phase of infection. The expected result is Negative. Fact Sheet for Patients:  StrictlyIdeas.no Fact Sheet for Healthcare Providers: BankingDealers.co.za This test is not yet approved or cleared by the [place] FDA and has been authorized for detection and/or diagnosis of SARS-CoV-2 by FDA under an Emergency Use Authorization (EUA).  This EUA will remain in effect  (meaning this test can be used) for the duration of the COVID-19 declaration under Section 564(b)(1) of the Act, 21 U.S.C. section 360bbb-3(b)(1), unless the authorization is terminated or revoked sooner. Performed at [facility], [place] [address], [place], [place] [address]     Studies/Results: Dg Chest 2 View  Result Date: 09/12/2018 CLINICAL DATA:  Sickle cell pain EXAM: CHEST - 2 VIEW COMPARISON:  09/04/2018 FINDINGS: Cardiac shadow is stable. Lungs are well aerated bilaterally. Increasing right mid and lower lung infiltrate is seen projecting in the right lower lobe on the lateral exam. This is slightly increased when compared with the prior exam and may represent some acute on chronic infiltrate. No other focal infiltrate is seen. No sizable effusion is noted. No acute bony abnormality is seen. IMPRESSION: Changes suggestive of acute on chronic infiltrate in the right lower lobe. Electronically Signed   By: Inez Catalina M.D.   On: 09/12/2018 22:40    Medications: Scheduled Meds: . enoxaparin (LOVENOX) injection  40 mg Subcutaneous Q24H  . folic acid  1 mg Oral Daily  . HYDROmorphone   Intravenous Q4H  . hydroxyurea  1,000 mg Oral  Daily  . ketorolac  15 mg Intravenous Q6H  . nicotine  21 mg Transdermal Daily  . senna-docusate  1 tablet Oral BID   Continuous Infusions: . dextrose 5 % and 0.45% NaCl 125 mL/hr at 09/13/18 1206  . diphenhydrAMINE     PRN Meds:.diphenhydrAMINE **OR** diphenhydrAMINE, naloxone **AND** sodium chloride flush, ondansetron (ZOFRAN) IV, oxycodone, polyethylene glycol  Assessment/Plan: Active Problems:   Chronic pain   Sickle cell anemia with crisis (HCC)   Tobacco use disorder, continuous   Physical tolerance to opiate drug  1. Hb Sickle Cell Disease with crisis: Reduce IVF D5 .45% Saline @ 75 mls/hour, continue weight based Dilaudid PCA, IV Toradol 15 mg Q 6 H, Monitor vitals very closely, Re-evaluate pain scale regularly, 2 L of  Oxygen by . 2. Community-acquired pneumonia: His chest x-ray showed "changes suggestive of acute on chronic infiltrate in the right lower lobe".  Will treat as community-acquired pneumonia with IV levofloxacin.  Monitor oxygenation closely.  Incentive spirometry. 3. Leukocytosis: Likely reactive, or from community-acquired pneumonia.  Continue IV antibiotics. 4. Sickle Cell Anemia: Hemoglobin is at baseline, no clinical indication for blood transfusion today. 5. Chronic pain Syndrome: Patient is highly opiate tolerant, continue home pain medication.  Patient counseled about pain and other nonpharmacologic means of pain control. 6. Tobacco use disorder: Matthew Shannon was counseled on the dangers of tobacco use, and was advised to quit. Reviewed strategies to maximize success, including removing cigarettes and smoking materials from environment, stress management and support of family/friends.  Code Status: Full Code Family Communication: N/A Disposition Plan: Not yet ready for discharge  Foster Frericks  If 7PM-7AM, please contact night-coverage.  09/13/2018, 1:27 PM  LOS: 1 day

## 2018-09-14 ENCOUNTER — Other Ambulatory Visit: Payer: Self-pay | Admitting: Internal Medicine

## 2018-09-14 DIAGNOSIS — J189 Pneumonia, unspecified organism: Secondary | ICD-10-CM

## 2018-09-14 MED ORDER — LEVOFLOXACIN 750 MG PO TABS
750.0000 mg | ORAL_TABLET | ORAL | Status: DC
  Administered 2018-09-14: 750 mg via ORAL
  Filled 2018-09-14: qty 1
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
## 2018-09-14 NOTE — Progress Notes (Signed)
Pharmacy IV to PO conversion  This patient is receiving Levofloxacin by the intravenous route. Based on criteria approved by the Pharmacy and Therapeutics Committee, and the Infectious Disease Division, the antibiotic(s) is/are being converted to equivalent oral dose form(s). These criteria include:   Patient being treated for a respiratory tract infection, urinary tract infection, cellulitis, or Clostridium Difficile Associated Diarrhea  The patient is not neutropenic and does not exhibit a GI malabsorption state  The patient is eating (either orally or per tube) and/or has been taking other orally administered medications for at least 24 hours.  The patient is improving clinically (physician assessment and a 24-hour Tmax of <=100.5 F)   Also, the patient is ordered Diphenhydramine by the intravenous route with a linked PO option available.  Based on criteria approved by the Pharmacy and [place], the IV option is being discontinued.   Not prescribed to treat or prevent a severe allergic reaction   Not prescribed as premedication prior to receiving blood product, biologic medication, antimicrobial, or chemotherapy agent   The patient has tolerated at least one dose of an oral or enteral medication   The patient has no evidence of active gastrointestinal bleeding or impaired GI absorption (gastrectomy, short bowel, patient on TNA or NPO).   The patient is not undergoing procedural sedation  If you have any questions about this conversion, please contact the Pharmacy Department (ext [phone]).  Thank you.  Reuel Boom, PharmD, BCPS [phone] 09/14/2018, 1:52 PM

## 2018-09-14 NOTE — BH Specialist Note (Signed)
CSW met with patient at bedside. Introduced self and role at [facility]. Advised that CSW available for support and resources. Patient had no concerns today, only waiting on his background check to clear for a new job he has applied for.   Patient reports he has a caseworker at another agency who is assisting in finding housing in [place] for patient and his two sons. Patient currently lives with family in [place].   CSW will remain available from clinic for support as needed.   Susan Porter, LCSW [facility] [phone]  

## 2018-09-15 ENCOUNTER — Other Ambulatory Visit: Payer: Self-pay | Admitting: Internal Medicine

## 2018-09-15 LAB — CBC WITH DIFFERENTIAL/PLATELET
Abs Immature Granulocytes: 0.14 10*3/uL — ABNORMAL HIGH (ref 0.00–0.07)
Basophils Absolute: 0.1 10*3/uL (ref 0.0–0.1)
Basophils Relative: 1 %
Eosinophils Absolute: 1.8 10*3/uL — ABNORMAL HIGH (ref 0.0–0.5)
Eosinophils Relative: 9 %
HCT: 26.6 % — ABNORMAL LOW (ref 39.0–52.0)
Hemoglobin: 8.7 g/dL — ABNORMAL LOW (ref 13.0–17.0)
Immature Granulocytes: 1 %
Lymphocytes Relative: 31 %
Lymphs Abs: 6 10*3/uL — ABNORMAL HIGH (ref 0.7–4.0)
MCH: 32 pg (ref 26.0–34.0)
MCHC: 32.7 g/dL (ref 30.0–36.0)
MCV: 97.8 fL (ref 80.0–100.0)
Monocytes Absolute: 1.4 10*3/uL — ABNORMAL HIGH (ref 0.1–1.0)
Monocytes Relative: 7 %
Neutro Abs: 10 10*3/uL — ABNORMAL HIGH (ref 1.7–7.7)
Neutrophils Relative %: 51 %
Platelets: 275 10*3/uL (ref 150–400)
RBC: 2.72 MIL/uL — ABNORMAL LOW (ref 4.22–5.81)
RDW: 19 % — ABNORMAL HIGH (ref 11.5–15.5)
WBC: 19.6 10*3/uL — ABNORMAL HIGH (ref 4.0–10.5)
nRBC: 0.3 % — ABNORMAL HIGH (ref 0.0–0.2)

## 2018-09-15 MED ORDER — PROMETHAZINE HCL 12.5 MG PO TABS: 12.5000 mg | ORAL_TABLET | Freq: Four times a day (QID) | ORAL | 0 refills | Status: DC | PRN

## 2018-09-15 MED ORDER — LEVOFLOXACIN 750 MG PO TABS: 750.0000 mg | ORAL_TABLET | Freq: Every day | ORAL | 0 refills | Status: AC

## 2018-09-15 NOTE — TOC Initial Note (Signed)
Transition of Care [facility]) - Initial/Assessment Note    Patient Details  Name: Matthew Shannon MRN: [id number] Date of Birth: 1990-10-31  Transition of [facility]) CM/SW Contact:    Nila Nephew, LCSW Phone Number: [phone] 09/15/2018, 6:31 AM  Clinical Narrative:        High Readmission Risk screening completed due to score 33%. Pt with sickle cell disease                 Activities of Daily Living Home Assistive Devices/Equipment: None ADL Screening (condition at time of admission) Patient's cognitive ability adequate to safely complete daily activities?: Yes Is the patient deaf or have difficulty hearing?: No Does the patient have difficulty seeing, even when wearing glasses/contacts?: No Does the patient have difficulty concentrating, remembering, or making decisions?: No Patient able to express need for assistance with ADLs?: Yes Does the patient have difficulty dressing or bathing?: No Independently performs ADLs?: Yes (appropriate for developmental age) Does the patient have difficulty walking or climbing stairs?: No Weakness of Legs: Both Weakness of Arms/Hands: None  Permission Sought/Granted                  Emotional Assessment              Admission diagnosis:  sickle cell Patient Active Problem List   Diagnosis Date Noted  . Cough   . Physical tolerance to opiate drug   . Sickle cell pain crisis ([place]) 08/31/2018  . Polysubstance abuse ([place])   . Tobacco use disorder, continuous 07/24/2015  . Sickle cell anemia with crisis ([place]) 07/09/2015  . Exercise hypoxemia 06/14/2015  . Chronic pain 05/21/2015  . EKG abnormalities   . Eczema   . Anemia 10/09/2014  . Tobacco abuse 09/24/2014   PCP:  Tresa Garter, MD Pharmacy:   [id number] [place], [place] [address] [place], [address] [facility] [address], [address] [place] [address] Phone: [phone] Fax: [place], [place] [address] [place] [address] Phone: [phone] Fax: [phone]     Social Determinants of Health (SDOH) Interventions    Readmission Risk Interventions Readmission Risk Prevention Plan 09/15/2018  Transportation Screening Complete  Medication Review (RN Care Manager) Complete  PCP or Specialist appointment within 3-5 days of discharge Not Complete  PCP/Specialist Appt Not Complete comments DC date unknown- pt is established at clinic  [facility] or [place] Not Complete  [place] or Home Care Consult Pt Refusal Comments no indication  SW Recovery Care/Counseling Consult Not Complete  SW Consult Not Complete Comments no indication  Palliative Care Screening Not Applicable  Skilled Nursing Facility Not Applicable  Some recent data might be hidden

## 2018-09-15 NOTE — Discharge Instructions (Signed)
Cough, Adult A cough helps to clear your throat and lungs. A cough may be a sign of an illness or another medical condition. An acute cough may only last 2-3 weeks, while a chronic cough may last 8 or more weeks. Many things can cause a cough. They include:  Germs (viruses or bacteria) that attack the airway.  Breathing in things that bother (irritate) your lungs.  Allergies.  Asthma.  Mucus that runs down the back of your throat (postnasal drip).  Smoking.  Acid backing up from the stomach into the tube that moves food from the mouth to the stomach (gastroesophageal reflux).  Some medicines.  Lung problems.  Other medical conditions, such as heart failure or a blood clot in the lung (pulmonary embolism). Follow these instructions at home: Medicines  Take over-the-counter and prescription medicines only as told by your doctor.  Talk with your doctor before you take medicines that stop a cough (coughsuppressants). Lifestyle   Do not smoke, and try not to be around smoke. Do not use any products that contain nicotine or tobacco, such as cigarettes, e-cigarettes, and chewing tobacco. If you need help quitting, ask your doctor.  Drink enough fluid to keep your pee (urine) pale yellow.  Avoid caffeine.  Do not drink alcohol if your doctor tells you not to drink. General instructions   Watch for any changes in your cough. Tell your doctor about them.  Always cover your mouth when you cough.  Stay away from things that make you cough, such as perfume, candles, campfire smoke, or cleaning products.  If the air is dry, use a cool mist vaporizer or humidifier in your home.  If your cough is worse at night, try using extra pillows to raise your head up higher while you sleep.  Rest as needed.  Keep all follow-up visits as told by your doctor. This is important. Contact a doctor if:  You have new symptoms.  You cough up pus.  Your cough does not get better after 2-3  weeks, or your cough gets worse.  Cough medicine does not help your cough and you are not sleeping well.  You have pain that gets worse or pain that is not helped with medicine.  You have a fever.  You are losing weight and you do not know why.  You have night sweats. Get help right away if:  You cough up blood.  You have trouble breathing.  Your heartbeat is very fast. These symptoms may be an emergency. Do not wait to see if the symptoms will go away. Get medical help right away. Call your local emergency services (911 in the [place] Do not drive yourself to the hospital. Summary  A cough helps to clear your throat and lungs. Many things can cause a cough.  Take over-the-counter and prescription medicines only as told by your doctor.  Always cover your mouth when you cough.  Contact a doctor if you have new symptoms or you have a cough that does not get better or gets worse. This information is not intended to replace advice given to you by your health care provider. Make sure you discuss any questions you have with your health care provider. Document Released: 10/09/2010 Document Revised: 02/14/2018 Document Reviewed: 02/14/2018 Elsevier Patient Education  2020 Elsevier Inc.  Sickle Cell Anemia, Adult  Sickle cell anemia is a condition in which red blood cells have an abnormal sickle shape. Red blood cells carry oxygen through the body. Sickle-shaped red blood cells  do not live as long as normal red blood cells. They also clump together and block blood from flowing through the blood vessels. This condition prevents the body from getting enough oxygen. Sickle cell anemia causes organ damage and pain. It also increases the risk of infection. What are the causes? This condition is caused by a gene that is passed from parent to child (inherited). Receiving two copies of the gene causes the disease. Receiving one copy causes the "trait," which means that symptoms are milder or not  present. What increases the risk? This condition is more likely to develop if your ancestors were from [place], the [place], [place] or [place], the [place], [place], or the [place]. What are the signs or symptoms? Symptoms of this condition include:  Episodes of pain (crises), especially in the hands and feet, joints, back, chest, or abdomen. The pain can be triggered by: ? An illness, especially if there is dehydration. ? Doing an activity with great effort (overexertion). ? Exposure to extreme temperature changes. ? High altitude.  Fatigue.  Shortness of breath or difficulty breathing.  Dizziness.  Pale skin or yellowed skin (jaundice).  Frequent bacterial infections.  Pain and swelling in the hands and feet (hand-food syndrome).  Prolonged, painful erection of the penis (priapism).  Acute chest syndrome. Symptoms of this include: ? Chest pain. ? Fever. ? Cough. ? Fast breathing.  Stroke.  Decreased activity.  Loss of appetite.  Change in behavior.  Headaches.  Seizures.  Vision changes.  Skin ulcers.  Heart disease.  High blood pressure.  Gallstones.  Liver and kidney problems. How is this diagnosed? This condition is diagnosed with blood tests that check for the gene that causes this condition. How is this treated? There is no cure for most cases of this condition. Treatment focuses on managing your symptoms and preventing complications of the disease. Your health care provider will work with you to identify the best treatment options for you based on an assessment of your condition. Treatment may include:  Medicines, including: ? Pain medicines. ? Antibiotic medicines for infection. ? Medicines to increase the production of a protein in red blood cells that helps carry oxygen in the body (hemoglobin).  Fluids to treat pain and swelling.  Oxygen to treat acute chest syndrome.  Blood transfusions to treat symptoms such as  fatigue, stroke, and acute chest syndrome.  Massage and physical therapy for pain.  Regular tests to monitor your condition, such as blood tests, X-rays, CT scans, MRI scans, ultrasounds, and lung function tests. These should be done every 3-12 months, depending on your age.  Hematopoietic stem cell transplant. This is a procedure to replace abnormal stem cells with healthy stem cells from a donor's bone marrow. Stem cells are cells that can develop into blood cells, and bone marrow is the spongy tissue inside the bones. Follow these instructions at home: Medicines  Take over-the-counter and prescription medicines only as told by your health care provider.  If you were prescribed an antibiotic medicine, take it as told by your health care provider. Do not stop taking the antibiotic even if you start to feel better.  If you develop a fever, do not take medicines to reduce the fever right away. This could cover up another problem. Notify your health care provider. Managing pain, stiffness, and swelling  Try these methods to help ease your pain: ? Using a heating pad. ? Taking a warm bath. ? Distracting yourself, such as by watching TV. Eating  and drinking  Drink enough fluid to keep your urine clear or pale yellow. Drink more in hot weather and during exercise.  Limit or avoid drinking alcohol.  Eat a balanced and nutritious diet. Eat plenty of fruits, vegetables, whole grains, and lean protein.  Take vitamins and supplements as directed by your health care provider. Traveling  When traveling, keep these with you: ? Your medical information. ? The names of your health care providers. ? Your medicines.  If you have to travel by air, ask about precautions you should take. Activity  Get plenty of rest.  Avoid activities that will lower your oxygen levels, such as exercising vigorously. General instructions  Do not use any products that contain nicotine or tobacco, such as  cigarettes and e-cigarettes. They lower blood oxygen levels. If you need help quitting, ask your health care provider.  Consider wearing a medical alert bracelet.  Avoid high altitudes.  Avoid extreme temperatures and extreme temperature changes.  Keep all follow-up visits as told by your health care provider. This is important. Contact a health care provider if:  You develop joint pain.  Your feet or hands swell or have pain.  You have fatigue. Get help right away if:  You have symptoms of infection. These include: ? Fever. ? Chills. ? Extreme tiredness. ? Irritability. ? Poor eating. ? Vomiting.  You feel dizzy or faint.  You have new abdominal pain, especially on the left side near the stomach area.  You develop priapism.  You have numbness in your arms or legs or have trouble moving them.  You have trouble talking.  You develop pain that cannot be controlled with medicine.  You become short of breath.  You have rapid breathing.  You have a persistent cough.  You have pain in your chest.  You develop a severe headache or stiff neck.  You feel bloated without eating or after eating a small amount of food.  Your skin is pale.  You suddenly lose vision. Summary  Sickle cell anemia is a condition in which red blood cells have an abnormal sickle shape. This disease can cause organ damage and chronic pain, and it can raise your risk of infection.  Sickle cell anemia is a genetic disorder.  Treatment focuses on managing your symptoms and preventing complications of the disease.  Get medical help right away if you have any signs of infection, such as a fever. This information is not intended to replace advice given to you by your health care provider. Make sure you discuss any questions you have with your health care provider. Document Released: 05/06/2005 Document Revised: 05/20/2018 Document Reviewed: 03/03/2016 Elsevier Patient Education  2020 [employer]  [employer]

## 2018-09-15 NOTE — Progress Notes (Signed)
Patient requesting to stay one more day. MD notified and states that there is no clinical indication to keep patient in the hospital. Patient is to be discharged per previously written orders.
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
## 2018-09-16 ENCOUNTER — Emergency Department (HOSPITAL_COMMUNITY)
Admission: EM | Admit: 2018-09-16 | Discharge: 2018-09-17 | Disposition: A | Discharge status: Home / Self Care | Payer: Medicaid Other | Attending: Emergency Medicine | Admitting: Emergency Medicine

## 2018-09-16 ENCOUNTER — Encounter (HOSPITAL_COMMUNITY): Payer: Self-pay | Admitting: Emergency Medicine

## 2018-09-16 ENCOUNTER — Other Ambulatory Visit: Payer: Self-pay

## 2018-09-16 ENCOUNTER — Emergency Department (HOSPITAL_COMMUNITY): Payer: Medicaid Other

## 2018-09-16 DIAGNOSIS — R079 Chest pain, unspecified: Secondary | ICD-10-CM | POA: Diagnosis present

## 2018-09-16 DIAGNOSIS — R112 Nausea with vomiting, unspecified: Secondary | ICD-10-CM | POA: Diagnosis not present

## 2018-09-16 DIAGNOSIS — G894 Chronic pain syndrome: Secondary | ICD-10-CM | POA: Insufficient documentation

## 2018-09-16 DIAGNOSIS — Z79899 Other long term (current) drug therapy: Secondary | ICD-10-CM | POA: Insufficient documentation

## 2018-09-16 DIAGNOSIS — D5700 Hb-SS disease with crisis, unspecified: Secondary | ICD-10-CM | POA: Diagnosis not present

## 2018-09-16 DIAGNOSIS — M549 Dorsalgia, unspecified: Secondary | ICD-10-CM | POA: Diagnosis not present

## 2018-09-16 DIAGNOSIS — F17210 Nicotine dependence, cigarettes, uncomplicated: Secondary | ICD-10-CM | POA: Insufficient documentation

## 2018-09-16 MED ORDER — ONDANSETRON 4 MG PO TBDP
4.0000 mg | ORAL_TABLET | Freq: Once | ORAL | Status: AC
  Administered 2018-09-17: 4 mg via ORAL
  Filled 2018-09-16: qty 1

## 2018-09-16 MED ORDER — HYDROMORPHONE HCL 2 MG/ML IJ SOLN
2.0000 mg | Freq: Once | INTRAMUSCULAR | Status: AC
  Administered 2018-09-17: 2 mg via INTRAMUSCULAR
  Filled 2018-09-16: qty 1

## 2018-09-16 NOTE — ED Notes (Signed)
Attempted to stick pt for blood x3, NT tried as well. Phlebotomy called.

## 2018-09-16 NOTE — ED Triage Notes (Signed)
[facility] transported pt from [facility] near [place] to [place] ED and reports the following:   Pt c/o back and chest pain from sickle cell.  BP 116/72 HR 76 RR 18 Temp 96.8 Pain 6/10
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
## 2018-09-17 ENCOUNTER — Other Ambulatory Visit: Payer: Self-pay

## 2018-09-17 ENCOUNTER — Emergency Department (HOSPITAL_COMMUNITY)
Admission: EM | Admit: 2018-09-17 | Discharge: 2018-09-17 | Disposition: A | Discharge status: Home / Self Care | Payer: Medicaid Other | Source: Home / Self Care | Attending: Emergency Medicine | Admitting: Emergency Medicine

## 2018-09-17 ENCOUNTER — Encounter (HOSPITAL_COMMUNITY): Payer: Self-pay | Admitting: Emergency Medicine

## 2018-09-17 DIAGNOSIS — D5700 Hb-SS disease with crisis, unspecified: Secondary | ICD-10-CM

## 2018-09-17 DIAGNOSIS — E876 Hypokalemia: Secondary | ICD-10-CM | POA: Insufficient documentation

## 2018-09-17 DIAGNOSIS — F17210 Nicotine dependence, cigarettes, uncomplicated: Secondary | ICD-10-CM | POA: Insufficient documentation

## 2018-09-17 DIAGNOSIS — R112 Nausea with vomiting, unspecified: Secondary | ICD-10-CM | POA: Insufficient documentation

## 2018-09-17 DIAGNOSIS — Z79899 Other long term (current) drug therapy: Secondary | ICD-10-CM | POA: Insufficient documentation

## 2018-09-17 LAB — CBC WITH DIFFERENTIAL/PLATELET
Abs Immature Granulocytes: 0.12 10*3/uL — ABNORMAL HIGH (ref 0.00–0.07)
Abs Immature Granulocytes: 0.16 10*3/uL — ABNORMAL HIGH (ref 0.00–0.07)
Basophils Absolute: 0.1 10*3/uL (ref 0.0–0.1)
Basophils Absolute: 0.1 10*3/uL (ref 0.0–0.1)
Basophils Relative: 1 %
Basophils Relative: 1 %
Eosinophils Absolute: 0.4 10*3/uL (ref 0.0–0.5)
Eosinophils Absolute: 1 10*3/uL — ABNORMAL HIGH (ref 0.0–0.5)
Eosinophils Relative: 2 %
Eosinophils Relative: 5 %
HCT: 21.9 % — ABNORMAL LOW (ref 39.0–52.0)
HCT: 22.8 % — ABNORMAL LOW (ref 39.0–52.0)
Hemoglobin: 7.5 g/dL — ABNORMAL LOW (ref 13.0–17.0)
Hemoglobin: 7.6 g/dL — ABNORMAL LOW (ref 13.0–17.0)
Immature Granulocytes: 1 %
Immature Granulocytes: 1 %
Lymphocytes Relative: 20 %
Lymphocytes Relative: 23 %
Lymphs Abs: 4 10*3/uL (ref 0.7–4.0)
Lymphs Abs: 4.5 10*3/uL — ABNORMAL HIGH (ref 0.7–4.0)
MCH: 32.6 pg (ref 26.0–34.0)
MCH: 33.2 pg (ref 26.0–34.0)
MCHC: 33.3 g/dL (ref 30.0–36.0)
MCHC: 34.2 g/dL (ref 30.0–36.0)
MCV: 96.9 fL (ref 80.0–100.0)
MCV: 97.9 fL (ref 80.0–100.0)
Monocytes Absolute: 1.8 10*3/uL — ABNORMAL HIGH (ref 0.1–1.0)
Monocytes Absolute: 2 10*3/uL — ABNORMAL HIGH (ref 0.1–1.0)
Monocytes Relative: 10 %
Monocytes Relative: 9 %
Neutro Abs: 12 10*3/uL — ABNORMAL HIGH (ref 1.7–7.7)
Neutro Abs: 13.8 10*3/uL — ABNORMAL HIGH (ref 1.7–7.7)
Neutrophils Relative %: 60 %
Neutrophils Relative %: 67 %
Platelets: 252 10*3/uL (ref 150–400)
Platelets: 280 10*3/uL (ref 150–400)
RBC: 2.26 MIL/uL — ABNORMAL LOW (ref 4.22–5.81)
RBC: 2.33 MIL/uL — ABNORMAL LOW (ref 4.22–5.81)
RDW: 19.6 % — ABNORMAL HIGH (ref 11.5–15.5)
RDW: 20 % — ABNORMAL HIGH (ref 11.5–15.5)
WBC: 19.8 10*3/uL — ABNORMAL HIGH (ref 4.0–10.5)
WBC: 20.3 10*3/uL — ABNORMAL HIGH (ref 4.0–10.5)
nRBC: 0.5 % — ABNORMAL HIGH (ref 0.0–0.2)
nRBC: 0.6 % — ABNORMAL HIGH (ref 0.0–0.2)

## 2018-09-17 LAB — COMPREHENSIVE METABOLIC PANEL
ALT: 16 U/L (ref 0–44)
ALT: 19 U/L (ref 0–44)
AST: 33 U/L (ref 15–41)
AST: 37 U/L (ref 15–41)
Albumin: 4 g/dL (ref 3.5–5.0)
Albumin: 4.3 g/dL (ref 3.5–5.0)
Alkaline Phosphatase: 61 U/L (ref 38–126)
Alkaline Phosphatase: 69 U/L (ref 38–126)
Anion gap: 9 (ref 5–15)
Anion gap: 9 (ref 5–15)
BUN: 7 mg/dL (ref 6–20)
BUN: 8 mg/dL (ref 6–20)
CO2: 25 mmol/L (ref 22–32)
CO2: 25 mmol/L (ref 22–32)
Calcium: 9.2 mg/dL (ref 8.9–10.3)
Calcium: 9.4 mg/dL (ref 8.9–10.3)
Chloride: 105 mmol/L (ref 98–111)
Chloride: 108 mmol/L (ref 98–111)
Creatinine, Ser: 0.49 mg/dL — ABNORMAL LOW (ref 0.61–1.24)
Creatinine, Ser: 0.64 mg/dL (ref 0.61–1.24)
GFR calc Af Amer: >60 mL/min (ref >60)
GFR calc Af Amer: >60 mL/min (ref >60)
GFR calc non Af Amer: >60 mL/min (ref >60)
GFR calc non Af Amer: >60 mL/min (ref >60)
Glucose, Bld: 106 mg/dL — ABNORMAL HIGH (ref 70–99)
Glucose, Bld: 119 mg/dL — ABNORMAL HIGH (ref 70–99)
Potassium: 3.3 mmol/L — ABNORMAL LOW (ref 3.5–5.1)
Potassium: 3.4 mmol/L — ABNORMAL LOW (ref 3.5–5.1)
Sodium: 139 mmol/L (ref 135–145)
Sodium: 142 mmol/L (ref 135–145)
Total Bilirubin: 5.8 mg/dL — ABNORMAL HIGH (ref 0.3–1.2)
Total Bilirubin: 6.2 mg/dL — ABNORMAL HIGH (ref 0.3–1.2)
Total Protein: 7.3 g/dL (ref 6.5–8.1)
Total Protein: 7.6 g/dL (ref 6.5–8.1)

## 2018-09-17 LAB — RETICULOCYTES
Immature Retic Fract: 35.6 % — ABNORMAL HIGH (ref 2.3–15.9)
Immature Retic Fract: 36.3 % — ABNORMAL HIGH (ref 2.3–15.9)
RBC.: 2.26 MIL/uL — ABNORMAL LOW (ref 4.22–5.81)
RBC.: 2.33 MIL/uL — ABNORMAL LOW (ref 4.22–5.81)
Retic Count, Absolute: 266.8 10*3/uL — ABNORMAL HIGH (ref 19.0–186.0)
Retic Count, Absolute: 275.7 10*3/uL — ABNORMAL HIGH (ref 19.0–186.0)
Retic Ct Pct: 11.5 % — ABNORMAL HIGH (ref 0.4–3.1)
Retic Ct Pct: 12.2 % — ABNORMAL HIGH (ref 0.4–3.1)

## 2018-09-17 MED ORDER — KETOROLAC TROMETHAMINE 15 MG/ML IJ SOLN
15.0000 mg | Freq: Once | INTRAMUSCULAR | Status: DC
  Filled 2018-09-17: qty 1

## 2018-09-17 MED ORDER — SODIUM CHLORIDE 0.9 % IV BOLUS
500.0000 mL | Freq: Once | INTRAVENOUS | Status: AC
  Administered 2018-09-17: 500 mL via INTRAVENOUS

## 2018-09-17 MED ORDER — HYDROMORPHONE HCL 1 MG/ML IJ SOLN: 2.0000 mg | INTRAMUSCULAR | Status: AC

## 2018-09-17 MED ORDER — HYDROMORPHONE HCL 1 MG/ML IJ SOLN
2.0000 mg | Freq: Once | INTRAMUSCULAR | Status: AC
  Administered 2018-09-17: 2 mg via INTRAVENOUS

## 2018-09-17 MED ORDER — ONDANSETRON 4 MG PO TBDP
4.0000 mg | ORAL_TABLET | Freq: Once | ORAL | Status: DC
  Filled 2018-09-17: qty 1

## 2018-09-17 MED ORDER — HYDROMORPHONE HCL 1 MG/ML IJ SOLN: 2.0000 mg | INTRAMUSCULAR | Status: DC

## 2018-09-17 MED ORDER — ONDANSETRON HCL 4 MG/2ML IJ SOLN: 4.0000 mg | INTRAMUSCULAR | Status: DC | PRN

## 2018-09-17 MED ORDER — SODIUM CHLORIDE 0.9% FLUSH: 3.0000 mL | Freq: Once | INTRAVENOUS | Status: DC

## 2018-09-17 MED ORDER — PROMETHAZINE HCL 25 MG/ML IJ SOLN
12.5000 mg | Freq: Once | INTRAMUSCULAR | Status: AC
  Administered 2018-09-17: 12.5 mg via INTRAVENOUS
  Filled 2018-09-17: qty 1

## 2018-09-17 MED ORDER — DIPHENHYDRAMINE HCL 50 MG/ML IJ SOLN
12.5000 mg | Freq: Once | INTRAMUSCULAR | Status: AC
  Administered 2018-09-17: 12.5 mg via INTRAVENOUS
  Filled 2018-09-17: qty 1

## 2018-09-17 MED ORDER — OXYCODONE HCL ER 15 MG PO T12A
15.0000 mg | EXTENDED_RELEASE_TABLET | Freq: Two times a day (BID) | ORAL | Status: DC
  Administered 2018-09-17: 15 mg via ORAL
  Filled 2018-09-17 (×2): qty 1

## 2018-09-17 MED ORDER — HYDROMORPHONE HCL 1 MG/ML IJ SOLN: 2.0000 mg | INTRAMUSCULAR | Status: DC | PRN

## 2018-09-17 MED ORDER — DIPHENHYDRAMINE HCL 50 MG/ML IJ SOLN
25.0000 mg | Freq: Once | INTRAMUSCULAR | Status: AC
  Administered 2018-09-17: 10:00:00 25 mg via INTRAVENOUS
  Filled 2018-09-17: qty 1

## 2018-09-17 MED ORDER — HYDROMORPHONE HCL 1 MG/ML IJ SOLN
2.0000 mg | INTRAMUSCULAR | Status: AC
  Filled 2018-09-17: qty 2

## 2018-09-17 MED ORDER — HYDROMORPHONE HCL 1 MG/ML IJ SOLN
2.0000 mg | INTRAMUSCULAR | Status: AC
  Administered 2018-09-17: 2 mg via INTRAVENOUS
  Filled 2018-09-17: qty 2

## 2018-09-17 MED ORDER — KETOROLAC TROMETHAMINE 60 MG/2ML IM SOLN
60.0000 mg | Freq: Once | INTRAMUSCULAR | Status: AC
  Administered 2018-09-17: 60 mg via INTRAMUSCULAR
  Filled 2018-09-17: qty 2

## 2018-09-17 MED ORDER — HYDROMORPHONE HCL 1 MG/ML IJ SOLN
1.0000 mg | Freq: Once | INTRAMUSCULAR | Status: AC
  Administered 2018-09-17: 1 mg via INTRAMUSCULAR
  Filled 2018-09-17: qty 1

## 2018-09-17 MED ORDER — SODIUM CHLORIDE 0.45 % IV SOLN: INTRAVENOUS | Status: DC

## 2018-09-17 MED ORDER — KETOROLAC TROMETHAMINE 15 MG/ML IJ SOLN
15.0000 mg | Freq: Once | INTRAMUSCULAR | Status: AC
  Administered 2018-09-17: 15 mg via INTRAVENOUS

## 2018-09-17 NOTE — ED Notes (Signed)
Took pt graham crackers, pnut butter, sprite as he requested.

## 2018-09-17 NOTE — ED Notes (Signed)
Entered pt room to give Toradol, and pt said, "Toradol doesn't work for my pain. If I have to take it, I will, but it doesn't help my pain." Messaged provider.

## 2018-09-17 NOTE — Discharge Instructions (Addendum)
Continue your regular pain medications at home. Follow up with your doctor on Monday for further management.
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
## 2018-09-17 NOTE — ED Triage Notes (Signed)
Patient comes in via [facility] [employer] [place] from [facility] with chest pain and back pain from his sickle cell.  Patient has had nausea and vomiting 4x today.  Patient has been seen at [facility] yesterday

## 2018-09-19 ENCOUNTER — Telehealth: Payer: Self-pay

## 2018-09-19 ENCOUNTER — Telehealth (HOSPITAL_COMMUNITY): Payer: Self-pay

## 2018-09-19 NOTE — Telephone Encounter (Signed)
Patient called requesting to come to the day hospital for sickle cell pain. Patient reported back and legpain rated 8/10. Reports last taking Oxycontin for pain on 09/18/18 at 2230. Denies fever, chest pain, nausea, vomiting, diarrhea and abdominal pain. Admits to not  having means of transportation at discharge without driving himself. Provider notified. Patient not cleared to come to day hospital for treatment. Pt advised to take PO medication and make an appointment in the morning per provider.

## 2018-09-20 ENCOUNTER — Telehealth (HOSPITAL_COMMUNITY): Payer: Self-pay | Admitting: General Practice

## 2018-09-20 ENCOUNTER — Other Ambulatory Visit: Payer: Self-pay | Admitting: Internal Medicine

## 2018-09-20 DIAGNOSIS — G894 Chronic pain syndrome: Secondary | ICD-10-CM

## 2018-09-20 DIAGNOSIS — D5700 Hb-SS disease with crisis, unspecified: Secondary | ICD-10-CM

## 2018-09-20 MED ORDER — OXYCODONE HCL 30 MG PO TABS: 30.0000 mg | ORAL_TABLET | ORAL | 0 refills | Status: DC | PRN

## 2018-09-20 NOTE — Telephone Encounter (Signed)
Refilled

## 2018-09-20 NOTE — Telephone Encounter (Signed)
Patient called stating he won't be able to come to the day hospital because he doesn't have transportation. He requested that his prescription be sent to the pharmacy. Per provider, prescription will be sent, patient should stay hydrated, continue to take his prescribed pain medication. Patient can call back in the morning or possibly go to the ER  if the pain is unbearable today. Patient notified, verbalized understanding.

## 2018-09-20 NOTE — Telephone Encounter (Signed)
Called patient and made aware. Thanks!

## 2018-09-28 ENCOUNTER — Emergency Department (HOSPITAL_BASED_OUTPATIENT_CLINIC_OR_DEPARTMENT_OTHER): Payer: Medicaid Other

## 2018-09-28 ENCOUNTER — Encounter (HOSPITAL_BASED_OUTPATIENT_CLINIC_OR_DEPARTMENT_OTHER): Payer: Self-pay

## 2018-09-28 ENCOUNTER — Emergency Department (HOSPITAL_BASED_OUTPATIENT_CLINIC_OR_DEPARTMENT_OTHER)
Admission: EM | Admit: 2018-09-28 | Discharge: 2018-09-28 | Disposition: A | Discharge status: Home / Self Care | Payer: Medicaid Other | Attending: Emergency Medicine | Admitting: Emergency Medicine

## 2018-09-28 ENCOUNTER — Other Ambulatory Visit: Payer: Self-pay

## 2018-09-28 ENCOUNTER — Emergency Department (HOSPITAL_COMMUNITY)
Admission: EM | Admit: 2018-09-28 | Discharge: 2018-09-28 | Discharge status: Against Medical Advice | Payer: Medicaid Other | Source: Home / Self Care

## 2018-09-28 DIAGNOSIS — F17210 Nicotine dependence, cigarettes, uncomplicated: Secondary | ICD-10-CM | POA: Diagnosis not present

## 2018-09-28 DIAGNOSIS — D5700 Hb-SS disease with crisis, unspecified: Secondary | ICD-10-CM | POA: Diagnosis present

## 2018-09-28 DIAGNOSIS — Z5321 Procedure and treatment not carried out due to patient leaving prior to being seen by health care provider: Secondary | ICD-10-CM | POA: Insufficient documentation

## 2018-09-28 DIAGNOSIS — R079 Chest pain, unspecified: Secondary | ICD-10-CM | POA: Diagnosis not present

## 2018-09-28 LAB — BASIC METABOLIC PANEL
Anion gap: 8 (ref 5–15)
BUN: 10 mg/dL (ref 6–20)
CO2: 25 mmol/L (ref 22–32)
Calcium: 9.4 mg/dL (ref 8.9–10.3)
Chloride: 106 mmol/L (ref 98–111)
Creatinine, Ser: 0.56 mg/dL — ABNORMAL LOW (ref 0.61–1.24)
GFR calc Af Amer: >60 mL/min (ref >60)
GFR calc non Af Amer: >60 mL/min (ref >60)
Glucose, Bld: 83 mg/dL (ref 70–99)
Potassium: 3.8 mmol/L (ref 3.5–5.1)
Sodium: 139 mmol/L (ref 135–145)

## 2018-09-28 LAB — CBC
HCT: 23.4 % — ABNORMAL LOW (ref 39.0–52.0)
Hemoglobin: 8.1 g/dL — ABNORMAL LOW (ref 13.0–17.0)
MCH: 32.8 pg (ref 26.0–34.0)
MCHC: 34.6 g/dL (ref 30.0–36.0)
MCV: 94.7 fL (ref 80.0–100.0)
Platelets: 274 10*3/uL (ref 150–400)
RBC: 2.47 MIL/uL — ABNORMAL LOW (ref 4.22–5.81)
RDW: 18.8 % — ABNORMAL HIGH (ref 11.5–15.5)
WBC: 15.6 10*3/uL — ABNORMAL HIGH (ref 4.0–10.5)
nRBC: 0.3 % — ABNORMAL HIGH (ref 0.0–0.2)

## 2018-09-28 LAB — RETICULOCYTES
Immature Retic Fract: 22.1 % — ABNORMAL HIGH (ref 2.3–15.9)
RBC.: 2.46 MIL/uL — ABNORMAL LOW (ref 4.22–5.81)
Retic Count, Absolute: 212.5 10*3/uL — ABNORMAL HIGH (ref 19.0–186.0)
Retic Ct Pct: 8.6 % — ABNORMAL HIGH (ref 0.4–3.1)

## 2018-09-28 MED ORDER — PROMETHAZINE HCL 25 MG/ML IJ SOLN
25.0000 mg | Freq: Once | INTRAMUSCULAR | Status: AC
  Administered 2018-09-28: 25 mg via INTRAMUSCULAR
  Filled 2018-09-28: qty 1

## 2018-09-28 MED ORDER — ONDANSETRON HCL 4 MG/2ML IJ SOLN
4.0000 mg | Freq: Once | INTRAMUSCULAR | Status: AC
  Administered 2018-09-28: 4 mg via INTRAVENOUS
  Filled 2018-09-28: qty 2

## 2018-09-28 MED ORDER — HYDROMORPHONE HCL 1 MG/ML IJ SOLN
2.0000 mg | Freq: Once | INTRAMUSCULAR | Status: AC
  Administered 2018-09-28: 09:00:00 2 mg via INTRAMUSCULAR
  Filled 2018-09-28: qty 2

## 2018-09-28 MED ORDER — HYDROMORPHONE HCL 1 MG/ML IJ SOLN
1.0000 mg | Freq: Once | INTRAMUSCULAR | Status: AC
  Administered 2018-09-28: 1 mg via INTRAVENOUS
  Filled 2018-09-28: qty 1

## 2018-09-28 NOTE — ED Triage Notes (Signed)
Pt reports sickle cell pain crisis beginning three days ago, prescribed oxycodone, took at 5am.  Pt states threw up medication.  Last seen in  clinic 2 weeks ago

## 2018-09-28 NOTE — ED Notes (Signed)
Pt resting quietly in room.  States pain unchanged.

## 2018-09-28 NOTE — ED Triage Notes (Signed)
Pt here via EMS for evaluation of sickle cell pain beginning today. No pinpoint pain, reports it's "all over." Pt slept for entire EMS transport.

## 2018-09-28 NOTE — ED Notes (Signed)
Pt vomited shortly after attempting po fluids.

## 2018-09-28 NOTE — ED Notes (Signed)
Pt states need for u/s guided IV access due to challenging vascular access.

## 2018-09-28 NOTE — ED Notes (Addendum)
Pt reports pain unchanged.  No vomiting since arrival. Pt reports improvement in nausea.  Provided PO fluids
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
## 2018-09-28 NOTE — Discharge Instructions (Addendum)
You were evaluated in the Emergency Department and after careful evaluation, we did not find any emergent condition requiring admission or further testing in the hospital.  Your symptoms today seem to be due to chronic pain related to sickle cell disease.  We recommend continued use of her home medications and follow-up with the sickle cell clinic today if your symptoms worsen.  Please return to the Emergency Department if you experience any worsening of your condition.  We encourage you to follow up with a primary care provider.  Thank you for allowing [place] to be a part of your care.

## 2018-09-28 NOTE — ED Notes (Signed)
Pt seen walking out of ER waiting room.

## 2018-10-03 ENCOUNTER — Telehealth: Payer: Self-pay

## 2018-10-04 ENCOUNTER — Other Ambulatory Visit: Payer: Self-pay | Admitting: Family Medicine

## 2018-10-04 ENCOUNTER — Ambulatory Visit: Payer: Self-pay | Admitting: Family Medicine

## 2018-10-04 ENCOUNTER — Telehealth (HOSPITAL_COMMUNITY): Payer: Self-pay | Admitting: *Deleted

## 2018-10-04 DIAGNOSIS — D5700 Hb-SS disease with crisis, unspecified: Secondary | ICD-10-CM

## 2018-10-04 DIAGNOSIS — G894 Chronic pain syndrome: Secondary | ICD-10-CM

## 2018-10-04 MED ORDER — OXYCODONE HCL 30 MG PO TABS: 30.0000 mg | ORAL_TABLET | ORAL | 0 refills | Status: DC | PRN

## 2018-10-04 NOTE — Progress Notes (Signed)
Meds ordered this encounter  Medications  . oxycodone (ROXICODONE) 30 MG immediate release tablet    Sig: Take 1 tablet (30 mg total) by mouth every 4 (four) hours as needed for up to 15 days for pain.    Dispense:  90 tablet    Refill:  0    Medication not to be filled prior to 10/05/2018.    Order Specific Question:   Supervising Provider    Answer:   Tresa Garter [[id number]]    Reviewed PDMP prior to sending prescription, no inconsistencies noted.    Donia Pounds  APRN, MSN, FNP-C [facility] [address] [place], [place] [address] [phone]

## 2018-10-04 NOTE — Telephone Encounter (Signed)
Patient called requesting to come to the day hospital for sickle cell pain. Patient reports bilateral leg and back pain rated 8/10. Reports last taking Oxycodone 30 mg for pain yesterday and currently being out of pain medication. COVID-19 screening done and negative. Denies fever, chest pain, nausea, vomiting, diarrhea, abdominal pain and priapism. Admits to having transportation at discharge without driving self. [place], [place] notified. Patient's prescription called in but patient can not fill it until tomorrow. Patient can come to the day hospital for pain management today. Patient advised and expresses an understanding.

## 2018-10-05 ENCOUNTER — Other Ambulatory Visit: Payer: Self-pay | Admitting: Family Medicine

## 2018-10-05 ENCOUNTER — Telehealth: Payer: Self-pay

## 2018-10-05 MED ORDER — PROMETHAZINE HCL 12.5 MG PO TABS: 12.5000 mg | ORAL_TABLET | Freq: Four times a day (QID) | ORAL | 2 refills | Status: DC | PRN

## 2018-10-06 NOTE — Telephone Encounter (Signed)
Medication sent to pharmacy  

## 2018-10-08 ENCOUNTER — Encounter (HOSPITAL_COMMUNITY): Payer: Self-pay

## 2018-10-08 ENCOUNTER — Emergency Department (HOSPITAL_COMMUNITY)
Admission: EM | Admit: 2018-10-08 | Discharge: 2018-10-08 | Disposition: A | Discharge status: Home / Self Care | Payer: Medicaid Other | Attending: Emergency Medicine | Admitting: Emergency Medicine

## 2018-10-08 ENCOUNTER — Other Ambulatory Visit: Payer: Self-pay

## 2018-10-08 DIAGNOSIS — D5700 Hb-SS disease with crisis, unspecified: Secondary | ICD-10-CM

## 2018-10-08 DIAGNOSIS — F17210 Nicotine dependence, cigarettes, uncomplicated: Secondary | ICD-10-CM | POA: Diagnosis not present

## 2018-10-08 DIAGNOSIS — Z7289 Other problems related to lifestyle: Secondary | ICD-10-CM | POA: Diagnosis not present

## 2018-10-08 DIAGNOSIS — M545 Low back pain: Secondary | ICD-10-CM | POA: Diagnosis present

## 2018-10-08 DIAGNOSIS — G894 Chronic pain syndrome: Secondary | ICD-10-CM | POA: Diagnosis not present

## 2018-10-08 DIAGNOSIS — R112 Nausea with vomiting, unspecified: Secondary | ICD-10-CM | POA: Diagnosis not present

## 2018-10-08 DIAGNOSIS — Z79899 Other long term (current) drug therapy: Secondary | ICD-10-CM | POA: Insufficient documentation

## 2018-10-08 LAB — COMPREHENSIVE METABOLIC PANEL
ALT: 29 U/L (ref 0–44)
AST: 48 U/L — ABNORMAL HIGH (ref 15–41)
Albumin: 4.7 g/dL (ref 3.5–5.0)
Alkaline Phosphatase: 71 U/L (ref 38–126)
Anion gap: 10 (ref 5–15)
BUN: 8 mg/dL (ref 6–20)
CO2: 24 mmol/L (ref 22–32)
Calcium: 9.4 mg/dL (ref 8.9–10.3)
Chloride: 102 mmol/L (ref 98–111)
Creatinine, Ser: 0.51 mg/dL — ABNORMAL LOW (ref 0.61–1.24)
GFR calc Af Amer: >60 mL/min (ref >60)
GFR calc non Af Amer: >60 mL/min (ref >60)
Glucose, Bld: 102 mg/dL — ABNORMAL HIGH (ref 70–99)
Potassium: 3.9 mmol/L (ref 3.5–5.1)
Sodium: 136 mmol/L (ref 135–145)
Total Bilirubin: 6.7 mg/dL — ABNORMAL HIGH (ref 0.3–1.2)
Total Protein: 8.2 g/dL — ABNORMAL HIGH (ref 6.5–8.1)

## 2018-10-08 LAB — CBC WITH DIFFERENTIAL/PLATELET
Abs Immature Granulocytes: 0.13 10*3/uL — ABNORMAL HIGH (ref 0.00–0.07)
Basophils Absolute: 0.1 10*3/uL (ref 0.0–0.1)
Basophils Relative: 1 %
Eosinophils Absolute: 0.4 10*3/uL (ref 0.0–0.5)
Eosinophils Relative: 3 %
HCT: 23.5 % — ABNORMAL LOW (ref 39.0–52.0)
Hemoglobin: 8.1 g/dL — ABNORMAL LOW (ref 13.0–17.0)
Immature Granulocytes: 1 %
Lymphocytes Relative: 17 %
Lymphs Abs: 2.7 10*3/uL (ref 0.7–4.0)
MCH: 34.6 pg — ABNORMAL HIGH (ref 26.0–34.0)
MCHC: 34.5 g/dL (ref 30.0–36.0)
MCV: 100.4 fL — ABNORMAL HIGH (ref 80.0–100.0)
Monocytes Absolute: 1.7 10*3/uL — ABNORMAL HIGH (ref 0.1–1.0)
Monocytes Relative: 11 %
Neutro Abs: 10.4 10*3/uL — ABNORMAL HIGH (ref 1.7–7.7)
Neutrophils Relative %: 67 %
Platelets: 268 10*3/uL (ref 150–400)
RBC: 2.34 MIL/uL — ABNORMAL LOW (ref 4.22–5.81)
RDW: 23 % — ABNORMAL HIGH (ref 11.5–15.5)
WBC: 15.4 10*3/uL — ABNORMAL HIGH (ref 4.0–10.5)
nRBC: 1.5 % — ABNORMAL HIGH (ref 0.0–0.2)

## 2018-10-08 LAB — RETICULOCYTES
Immature Retic Fract: 31.6 % — ABNORMAL HIGH (ref 2.3–15.9)
RBC.: 2.34 MIL/uL — ABNORMAL LOW (ref 4.22–5.81)
Retic Count, Absolute: 355.9 10*3/uL — ABNORMAL HIGH (ref 19.0–186.0)
Retic Ct Pct: 15.2 % — ABNORMAL HIGH (ref 0.4–3.1)

## 2018-10-08 LAB — LIPASE, BLOOD: Lipase: 24 U/L (ref 11–51)

## 2018-10-08 MED ORDER — OXYCODONE HCL 5 MG PO TABS
30.0000 mg | ORAL_TABLET | Freq: Once | ORAL | Status: AC
  Administered 2018-10-08: 08:00:00 30 mg via ORAL
  Filled 2018-10-08: qty 6

## 2018-10-08 MED ORDER — DIPHENHYDRAMINE HCL 50 MG/ML IJ SOLN
25.0000 mg | Freq: Once | INTRAMUSCULAR | Status: AC
  Administered 2018-10-08: 07:00:00 25 mg via INTRAVENOUS
  Filled 2018-10-08: qty 1

## 2018-10-08 MED ORDER — DIPHENHYDRAMINE HCL 25 MG PO CAPS
50.0000 mg | ORAL_CAPSULE | Freq: Once | ORAL | Status: AC
  Administered 2018-10-08: 07:00:00 50 mg via ORAL
  Filled 2018-10-08: qty 2

## 2018-10-08 MED ORDER — PROMETHAZINE HCL 25 MG/ML IJ SOLN
25.0000 mg | Freq: Once | INTRAMUSCULAR | Status: AC
  Administered 2018-10-08: 07:00:00 25 mg via INTRAVENOUS
  Filled 2018-10-08: qty 1

## 2018-10-08 MED ORDER — SODIUM CHLORIDE 0.45 % IV SOLN
INTRAVENOUS | Status: DC
  Administered 2018-10-08: 06:00:00 via INTRAVENOUS

## 2018-10-08 MED ORDER — ONDANSETRON HCL 4 MG/2ML IJ SOLN
4.0000 mg | INTRAMUSCULAR | Status: DC | PRN
  Administered 2018-10-08: 06:00:00 4 mg via INTRAVENOUS
  Filled 2018-10-08: qty 2

## 2018-10-08 MED ORDER — KETOROLAC TROMETHAMINE 30 MG/ML IJ SOLN
30.0000 mg | INTRAMUSCULAR | Status: AC
  Administered 2018-10-08: 06:00:00 30 mg via INTRAVENOUS
  Filled 2018-10-08: qty 1

## 2018-10-08 MED ORDER — HYDROMORPHONE HCL 2 MG/ML IJ SOLN
2.0000 mg | Freq: Once | INTRAMUSCULAR | Status: AC
  Administered 2018-10-08: 06:00:00 2 mg via INTRAMUSCULAR
  Filled 2018-10-08: qty 1

## 2018-10-08 MED ORDER — ONDANSETRON 4 MG PO TBDP: 4.0000 mg | ORAL_TABLET | Freq: Three times a day (TID) | ORAL | 0 refills | Status: DC | PRN
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
## 2018-10-08 NOTE — ED Provider Notes (Signed)
Received patient at signout from [place].  Refer to provider note for full history and physical examination.  Briefly, patient is a 28 year old male with history of sickle cell anemia, chronic pain syndrome presenting for evaluation of abdominal pain with associated nausea and vomiting.  Has a history of drug-seeking behavior and has sought care from multiple facilities over the last month with most recent admission on 10/02/2018 at [facility], discharged on 8/20 6/23 days ago.  He did have his chronic pain medications which includes 30 mg oxycodone refilled on the 26th per [facility] PMP query.  Lab work shows chronic leukocytosis, baseline H&H, slight elevation in reticulocyte count.  No concern for acute chest syndrome.  Abdominal examination benign.  Has received antiemetics and pain medicine in the ED but has had persistent vomiting.  Will attempt p.o. challenge.  If he does not tolerate p.o. then will likely require admission for further evaluation and pain management. Physical Exam  BP 103/63 (BP Location: Right Arm)   Pulse 76   Temp 98.3 F (36.8 C) (Oral)   Resp 14   Wt 61.2 kg   SpO2 99%   BMI 19.37 kg/m   Physical Exam Vitals signs and nursing note reviewed.  Constitutional:      General: He is not in acute distress.    Appearance: He is well-developed.     Comments: Resting comfortably in bed, sleeping but easily arousable  HENT:     Head: Normocephalic and atraumatic.  Eyes:     General:        Right eye: No discharge.        Left eye: No discharge.     Conjunctiva/sclera: Conjunctivae normal.  Neck:     Vascular: No JVD.     Trachea: No tracheal deviation.  Cardiovascular:     Rate and Rhythm: Normal rate.  Pulmonary:     Effort: Pulmonary effort is normal.  Abdominal:     General: Abdomen is flat. There is no distension.  Skin:    General: Skin is warm and dry.     Findings: No erythema.  Neurological:     Mental Status: He is alert.  Psychiatric:         Behavior: Behavior normal.     ED Course/Procedures   Clinical Course as of Oct 07 924  Sat Oct 08, 2018  0637 Baseline  Hemoglobin(!): 8.1 [HM]  0638 Slightly elevated from 10 days ago  Retic Ct Pct(!): 15.2 [HM]  [phone] Afebrile without tachycardia or hypotension  Temp: 98.2 F (36.8 C) [HM]  0651 Pt c/o itching, but vomiting oral benadryl.  Will give IV phenergan and benadryl.    [HM]  0539 elevated  AST(!): 48 [HM]  0706 Improved from baseline  WBC(!): 15.4 [HM]    Clinical Course User Index [HM] Muthersbaugh, Jarrett Soho, PA-C    Procedures  MDM  Patient received his home dose of oxycodone in the ED and tolerated this without difficulty, no persistent emesis.  Reports his nausea has improved.  Abdominal examination remains benign.  He reports persistent pain however it has improved since coming to the ED.  Recommend that he take his home medications, follow-up with his PCP for reevaluation.  He has antiemetics at home but will give Zofran ODT to take as needed.  Discussed advancing diet slowly, pushing fluids.  Discussed strict ED return precautions. Patient verbalized understanding of and agreement with plan and is safe for discharge home at this time.  Jeanie SewerFawze, Kailoni Vahle A, PA-C 10/08/18 0930    Melene PlanFloyd, Dan, DO 10/08/18 1329

## 2018-10-08 NOTE — Discharge Instructions (Addendum)
Take your home medicines as prescribed.  Instead of taking Phenergan, you can take Zofran as needed for nausea or vomiting.  Let this medicine dissolve under your tongue and wait around 10 to 15 minutes before having anything to eat or drink to give this medicine time to work.  Do not take Phenergan and Zofran at the same time.  Drink plenty of fluids and get plenty of rest.  Call your primary care provider for reevaluation of your symptoms.  Return to the emergency department if any concerning signs or symptoms develop such as persistent vomiting, high fevers, or loss of consciousness.

## 2018-10-10 ENCOUNTER — Encounter (HOSPITAL_COMMUNITY): Payer: Self-pay | Admitting: Emergency Medicine

## 2018-10-10 ENCOUNTER — Other Ambulatory Visit: Payer: Self-pay

## 2018-10-10 ENCOUNTER — Emergency Department (HOSPITAL_COMMUNITY)
Admission: EM | Admit: 2018-10-10 | Discharge: 2018-10-10 | Disposition: A | Discharge status: Home / Self Care | Payer: Medicaid Other | Attending: Emergency Medicine | Admitting: Emergency Medicine

## 2018-10-10 DIAGNOSIS — F17210 Nicotine dependence, cigarettes, uncomplicated: Secondary | ICD-10-CM | POA: Insufficient documentation

## 2018-10-10 DIAGNOSIS — Z79899 Other long term (current) drug therapy: Secondary | ICD-10-CM | POA: Insufficient documentation

## 2018-10-10 DIAGNOSIS — D5700 Hb-SS disease with crisis, unspecified: Secondary | ICD-10-CM | POA: Diagnosis not present

## 2018-10-10 DIAGNOSIS — M549 Dorsalgia, unspecified: Secondary | ICD-10-CM | POA: Diagnosis present

## 2018-10-10 LAB — CBC WITH DIFFERENTIAL/PLATELET
Abs Immature Granulocytes: 0.19 10*3/uL — ABNORMAL HIGH (ref 0.00–0.07)
Basophils Absolute: 0.1 10*3/uL (ref 0.0–0.1)
Basophils Relative: 1 %
Eosinophils Absolute: 0.4 10*3/uL (ref 0.0–0.5)
Eosinophils Relative: 2 %
HCT: 24.3 % — ABNORMAL LOW (ref 39.0–52.0)
Hemoglobin: 8.1 g/dL — ABNORMAL LOW (ref 13.0–17.0)
Immature Granulocytes: 1 %
Lymphocytes Relative: 16 %
Lymphs Abs: 2.8 10*3/uL (ref 0.7–4.0)
MCH: 34 pg (ref 26.0–34.0)
MCHC: 33.3 g/dL (ref 30.0–36.0)
MCV: 102.1 fL — ABNORMAL HIGH (ref 80.0–100.0)
Monocytes Absolute: 2 10*3/uL — ABNORMAL HIGH (ref 0.1–1.0)
Monocytes Relative: 12 %
Neutro Abs: 11.5 10*3/uL — ABNORMAL HIGH (ref 1.7–7.7)
Neutrophils Relative %: 68 %
Platelets: 363 10*3/uL (ref 150–400)
RBC: 2.38 MIL/uL — ABNORMAL LOW (ref 4.22–5.81)
RDW: 22.1 % — ABNORMAL HIGH (ref 11.5–15.5)
WBC: 16.9 10*3/uL — ABNORMAL HIGH (ref 4.0–10.5)
nRBC: 0.8 % — ABNORMAL HIGH (ref 0.0–0.2)

## 2018-10-10 LAB — RETICULOCYTES
Immature Retic Fract: 26.9 % — ABNORMAL HIGH (ref 2.3–15.9)
RBC.: 2.38 MIL/uL — ABNORMAL LOW (ref 4.22–5.81)
Retic Count, Absolute: 216 10*3/uL — ABNORMAL HIGH (ref 19.0–186.0)
Retic Ct Pct: 13.3 % — ABNORMAL HIGH (ref 0.4–3.1)

## 2018-10-10 LAB — COMPREHENSIVE METABOLIC PANEL
ALT: 26 U/L (ref 0–44)
AST: 47 U/L — ABNORMAL HIGH (ref 15–41)
Albumin: 4.5 g/dL (ref 3.5–5.0)
Alkaline Phosphatase: 74 U/L (ref 38–126)
Anion gap: 11 (ref 5–15)
BUN: 8 mg/dL (ref 6–20)
CO2: 23 mmol/L (ref 22–32)
Calcium: 9.6 mg/dL (ref 8.9–10.3)
Chloride: 103 mmol/L (ref 98–111)
Creatinine, Ser: 0.55 mg/dL — ABNORMAL LOW (ref 0.61–1.24)
GFR calc Af Amer: >60 mL/min (ref >60)
GFR calc non Af Amer: >60 mL/min (ref >60)
Glucose, Bld: 107 mg/dL — ABNORMAL HIGH (ref 70–99)
Potassium: 4.8 mmol/L (ref 3.5–5.1)
Sodium: 137 mmol/L (ref 135–145)
Total Bilirubin: 5.4 mg/dL — ABNORMAL HIGH (ref 0.3–1.2)
Total Protein: 8.2 g/dL — ABNORMAL HIGH (ref 6.5–8.1)

## 2018-10-10 MED ORDER — PROMETHAZINE HCL 25 MG RE SUPP: 25.0000 mg | Freq: Four times a day (QID) | RECTAL | 0 refills | Status: DC | PRN

## 2018-10-10 MED ORDER — OXYCODONE HCL 5 MG PO TABS
30.0000 mg | ORAL_TABLET | Freq: Once | ORAL | Status: AC
  Administered 2018-10-10: 13:00:00 30 mg via ORAL
  Filled 2018-10-10: qty 6

## 2018-10-10 MED ORDER — KETOROLAC TROMETHAMINE 60 MG/2ML IM SOLN
15.0000 mg | Freq: Once | INTRAMUSCULAR | Status: AC
  Administered 2018-10-10: 12:00:00 15 mg via INTRAMUSCULAR
  Filled 2018-10-10: qty 2

## 2018-10-10 MED ORDER — DROPERIDOL 2.5 MG/ML IJ SOLN
2.5000 mg | Freq: Once | INTRAMUSCULAR | Status: AC
  Administered 2018-10-10: 12:00:00 2.5 mg via INTRAMUSCULAR
  Filled 2018-10-10: qty 2

## 2018-10-10 MED ORDER — DIPHENHYDRAMINE HCL 50 MG/ML IJ SOLN
25.0000 mg | Freq: Once | INTRAMUSCULAR | Status: AC
  Administered 2018-10-10: 12:00:00 25 mg via INTRAMUSCULAR
  Filled 2018-10-10: qty 1

## 2018-10-10 MED ORDER — HYDROMORPHONE HCL 1 MG/ML IJ SOLN
2.0000 mg | Freq: Once | INTRAMUSCULAR | Status: AC
  Administered 2018-10-10: 2 mg via SUBCUTANEOUS
  Filled 2018-10-10: qty 2

## 2018-10-10 NOTE — ED Notes (Signed)
Discharge instructions and prescriptions discussed with Pt. Pt verbalized understanding. Pt stable and ambulatory.   

## 2018-10-10 NOTE — Discharge Instructions (Addendum)
Please follow-up with your sickle cell doctor.  Go to the sickle cell clinic tomorrow if he continued to have issues.  I prescribed you nausea medicine that you can put in your rectum which can help you with the nausea if you are having trouble at home and maybe that could save you a visit to the emergency department.
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
## 2018-10-10 NOTE — ED Notes (Signed)
Pt called to room no answer, called outside also. Bystanders say he walked out. Will call again.

## 2018-10-10 NOTE — ED Triage Notes (Signed)
Pt reports sickle cell pain for the past 2 days.

## 2018-10-11 ENCOUNTER — Other Ambulatory Visit: Payer: Self-pay

## 2018-10-11 ENCOUNTER — Emergency Department (HOSPITAL_COMMUNITY)
Admission: EM | Admit: 2018-10-11 | Discharge: 2018-10-11 | Discharge status: Against Medical Advice | Payer: Medicaid Other | Attending: Emergency Medicine | Admitting: Emergency Medicine

## 2018-10-11 DIAGNOSIS — Z5321 Procedure and treatment not carried out due to patient leaving prior to being seen by health care provider: Secondary | ICD-10-CM | POA: Insufficient documentation

## 2018-10-11 DIAGNOSIS — M7918 Myalgia, other site: Secondary | ICD-10-CM | POA: Diagnosis present

## 2018-10-11 NOTE — ED Triage Notes (Signed)
Per [facility] pt form work for sickle cell back pains x 2 days.

## 2018-10-11 NOTE — ED Notes (Signed)
I called patient name in the lobby and no one responded 

## 2018-10-16 MED ORDER — SENNOSIDES-DOCUSATE SODIUM 8.6-50 MG PO TABS
2.00 | ORAL_TABLET | ORAL | Status: DC
Start: 2018-10-16 — End: 2018-10-16

## 2018-10-16 MED ORDER — NALOXONE HCL 0.4 MG/ML IJ SOLN
0.40 | INTRAMUSCULAR | Status: DC
Start: ? — End: 2018-10-16

## 2018-10-16 MED ORDER — ONDANSETRON HCL 4 MG/2ML IJ SOLN
4.00 | INTRAMUSCULAR | Status: DC
Start: 2018-10-16 — End: 2018-10-16

## 2018-10-16 MED ORDER — ACETAMINOPHEN 325 MG PO TABS
975.00 | ORAL_TABLET | ORAL | Status: DC
Start: 2018-10-16 — End: 2018-10-16

## 2018-10-16 MED ORDER — ENOXAPARIN SODIUM 40 MG/0.4ML ~~LOC~~ SOLN
40.00 | SUBCUTANEOUS | Status: DC
Start: 2018-10-17 — End: 2018-10-16

## 2018-10-16 MED ORDER — HYDROXYUREA 500 MG PO CAPS
1000.00 | ORAL_CAPSULE | ORAL | Status: DC
Start: 2018-10-17 — End: 2018-10-16

## 2018-10-16 MED ORDER — POLYETHYLENE GLYCOL 3350 17 G PO PACK
17.00 | PACK | ORAL | Status: DC
Start: 2018-10-17 — End: 2018-10-16

## 2018-10-16 MED ORDER — OXYCODONE HCL 5 MG PO TABS
30.00 | ORAL_TABLET | ORAL | Status: DC
Start: ? — End: 2018-10-16

## 2018-10-16 MED ORDER — GENERIC EXTERNAL MEDICATION
12.50 | Status: DC
Start: ? — End: 2018-10-16

## 2018-10-16 MED ORDER — FOLIC ACID 1 MG PO TABS
1000.00 | ORAL_TABLET | ORAL | Status: DC
Start: 2018-10-17 — End: 2018-10-16

## 2018-10-18 ENCOUNTER — Telehealth: Payer: Self-pay

## 2018-10-18 ENCOUNTER — Telehealth (HOSPITAL_COMMUNITY): Payer: Self-pay | Admitting: General Practice

## 2018-10-18 NOTE — Telephone Encounter (Signed)
Patient called, complained of pain in the back and legs rated at 7/10. Denied chest pain, fever, diarrhea, abdominal pain, nausea/vomitting. Screened negative for Covid-19 symptoms. Admitted to having means of transportation without driving self after treatment. Last took 30 mg of oxycodone on 10/16/2018. Patient stated he called in his prescription medication 10/14/2018 2020. RN informed Andria Frames about the medication refill. Per provider, patient should drink 64 ounces of water, take Tylenol and Ibuprofen interchangeably. Patient notified, verbalized understanding.

## 2018-10-19 ENCOUNTER — Other Ambulatory Visit: Payer: Self-pay | Admitting: Internal Medicine

## 2018-10-19 DIAGNOSIS — G894 Chronic pain syndrome: Secondary | ICD-10-CM

## 2018-10-19 DIAGNOSIS — D5700 Hb-SS disease with crisis, unspecified: Secondary | ICD-10-CM

## 2018-10-19 MED ORDER — PROMETHAZINE HCL 12.5 MG PO TABS: 12.5000 mg | ORAL_TABLET | Freq: Three times a day (TID) | ORAL | 0 refills | Status: DC | PRN

## 2018-10-19 MED ORDER — OXYCODONE HCL 30 MG PO TABS: 30.0000 mg | ORAL_TABLET | ORAL | 0 refills | Status: DC | PRN

## 2018-10-19 NOTE — Telephone Encounter (Signed)
Refilled

## 2018-10-19 NOTE — Telephone Encounter (Signed)
Patient called requesting to come to the day hospital for sickle cell pain. Patient reports back and bilateral leg pain rated 7/10. Reports being out of Oxycodone 30 mg. COVID-19 screening done and negative for symptoms. Patient denies fever, chest pain, nausea, vomiting, diarrhea, abdominal pain and priapism.  [place], [place] notified. Patient's pain medications will be called in to pharmacy but the day hospital is currently full so patient is unable to come. Patient advised and expresses an understanding.

## 2018-10-26 ENCOUNTER — Emergency Department (HOSPITAL_COMMUNITY)
Admission: EM | Admit: 2018-10-26 | Discharge: 2018-10-26 | Discharge status: Against Medical Advice | Payer: Medicaid Other

## 2018-10-26 NOTE — ED Notes (Signed)
Pt not answering when called back to triage

## 2018-11-01 ENCOUNTER — Telehealth: Payer: Self-pay | Admitting: Family Medicine

## 2018-11-01 ENCOUNTER — Other Ambulatory Visit: Payer: Self-pay | Admitting: Family Medicine

## 2018-11-01 DIAGNOSIS — D5700 Hb-SS disease with crisis, unspecified: Secondary | ICD-10-CM

## 2018-11-01 DIAGNOSIS — G894 Chronic pain syndrome: Secondary | ICD-10-CM

## 2018-11-01 MED ORDER — OXYCODONE HCL 30 MG PO TABS: 30.0000 mg | ORAL_TABLET | ORAL | 0 refills | Status: DC | PRN

## 2018-11-01 NOTE — Progress Notes (Signed)
Reviewed PDMP prior to prescribing opiate medications, no inconsistencies noted.   Meds ordered this encounter  Medications  . oxycodone (ROXICODONE) 30 MG immediate release tablet    Sig: Take 1 tablet (30 mg total) by mouth every 4 (four) hours as needed for up to 15 days for pain.    Dispense:  90 tablet    Refill:  0    Order Specific Question:   Supervising Provider    Answer:   Tresa Garter [[id number]]     Donia Pounds  APRN, MSN, FNP-C Patient [facility] [address] [place], [place] [address] [phone]

## 2018-11-01 NOTE — Telephone Encounter (Signed)
Refill request for oxycodone. Please advise. Thanks!  

## 2018-11-02 ENCOUNTER — Other Ambulatory Visit: Payer: Self-pay | Admitting: Family Medicine

## 2018-11-02 DIAGNOSIS — G894 Chronic pain syndrome: Secondary | ICD-10-CM

## 2018-11-02 DIAGNOSIS — D5700 Hb-SS disease with crisis, unspecified: Secondary | ICD-10-CM

## 2018-11-02 MED ORDER — OXYCODONE HCL 30 MG PO TABS: 30.0000 mg | ORAL_TABLET | ORAL | 0 refills | Status: DC | PRN

## 2018-11-02 NOTE — Telephone Encounter (Signed)
Called and spoke with patient, advised that rx has been sent in. Thanks ! 

## 2018-11-03 ENCOUNTER — Other Ambulatory Visit: Payer: Self-pay | Admitting: Family Medicine

## 2018-11-03 ENCOUNTER — Other Ambulatory Visit: Payer: Self-pay | Admitting: Internal Medicine

## 2018-11-03 DIAGNOSIS — R110 Nausea: Secondary | ICD-10-CM

## 2018-11-03 MED ORDER — ONDANSETRON 4 MG PO TBDP: 4.0000 mg | ORAL_TABLET | Freq: Three times a day (TID) | ORAL | 1 refills | Status: DC | PRN

## 2018-11-15 ENCOUNTER — Ambulatory Visit: Payer: Self-pay | Admitting: Family Medicine

## 2018-11-17 ENCOUNTER — Telehealth: Payer: Self-pay | Admitting: Internal Medicine

## 2018-11-17 ENCOUNTER — Other Ambulatory Visit: Payer: Self-pay | Admitting: Internal Medicine

## 2018-11-17 DIAGNOSIS — D5700 Hb-SS disease with crisis, unspecified: Secondary | ICD-10-CM

## 2018-11-17 DIAGNOSIS — G894 Chronic pain syndrome: Secondary | ICD-10-CM

## 2018-11-17 MED ORDER — OXYCODONE HCL 30 MG PO TABS: 30.0000 mg | ORAL_TABLET | ORAL | 0 refills | Status: DC | PRN

## 2018-11-17 NOTE — Telephone Encounter (Signed)
Med Refilled - Oxycodone

## 2018-11-17 NOTE — Telephone Encounter (Signed)
Refilled

## 2018-11-29 ENCOUNTER — Encounter: Payer: Self-pay | Admitting: Family Medicine

## 2018-11-29 ENCOUNTER — Ambulatory Visit (INDEPENDENT_AMBULATORY_CARE_PROVIDER_SITE_OTHER): Payer: Medicaid Other | Admitting: Family Medicine

## 2018-11-29 ENCOUNTER — Other Ambulatory Visit: Payer: Self-pay

## 2018-11-29 VITALS — BP 122/73 | HR 86 | Temp 98.0°F | Resp 18 | Wt 125.8 lb

## 2018-11-29 DIAGNOSIS — Z7289 Other problems related to lifestyle: Secondary | ICD-10-CM

## 2018-11-29 DIAGNOSIS — D571 Sickle-cell disease without crisis: Secondary | ICD-10-CM

## 2018-11-29 DIAGNOSIS — F1120 Opioid dependence, uncomplicated: Secondary | ICD-10-CM | POA: Diagnosis not present

## 2018-11-29 DIAGNOSIS — M79A11 Nontraumatic compartment syndrome of right upper extremity: Secondary | ICD-10-CM | POA: Diagnosis not present

## 2018-11-29 DIAGNOSIS — G894 Chronic pain syndrome: Secondary | ICD-10-CM

## 2018-11-29 MED ORDER — SULFAMETHOXAZOLE-TRIMETHOPRIM 800-160 MG PO TABS: 1.0000 | ORAL_TABLET | Freq: Two times a day (BID) | ORAL | 0 refills | Status: DC

## 2018-11-29 MED ORDER — OXYCODONE HCL 30 MG PO TABS: 30.0000 mg | ORAL_TABLET | ORAL | 0 refills | Status: DC | PRN
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
## 2018-11-30 LAB — COMPREHENSIVE METABOLIC PANEL
ALT: 12 IU/L (ref 0–44)
AST: 36 IU/L (ref 0–40)
Albumin/Globulin Ratio: 1.2 (ref 1.2–2.2)
Albumin: 4.2 g/dL (ref 4.1–5.2)
Alkaline Phosphatase: 84 IU/L (ref 39–117)
BUN/Creatinine Ratio: 8 — ABNORMAL LOW (ref 9–20)
BUN: 5 mg/dL — ABNORMAL LOW (ref 6–20)
Bilirubin Total: 4.4 mg/dL — ABNORMAL HIGH (ref 0.0–1.2)
CO2: 23 mmol/L (ref 20–29)
Calcium: 9.5 mg/dL (ref 8.7–10.2)
Chloride: 104 mmol/L (ref 96–106)
Creatinine, Ser: 0.6 mg/dL — ABNORMAL LOW (ref 0.76–1.27)
GFR calc Af Amer: 156 mL/min/{1.73_m2} (ref >59)
GFR calc non Af Amer: 135 mL/min/{1.73_m2} (ref >59)
Globulin, Total: 3.6 g/dL (ref 1.5–4.5)
Glucose: 96 mg/dL (ref 65–99)
Potassium: 4.3 mmol/L (ref 3.5–5.2)
Sodium: 137 mmol/L (ref 134–144)
Total Protein: 7.8 g/dL (ref 6.0–8.5)

## 2018-11-30 LAB — CBC WITH DIFFERENTIAL/PLATELET
Basophils Absolute: 0.1 10*3/uL (ref 0.0–0.2)
Basos: 1 %
EOS (ABSOLUTE): 0.3 10*3/uL (ref 0.0–0.4)
Eos: 3 %
Hematocrit: 24.6 % — ABNORMAL LOW (ref 37.5–51.0)
Hemoglobin: 8.1 g/dL — ABNORMAL LOW (ref 13.0–17.7)
Immature Grans (Abs): 0.1 10*3/uL (ref 0.0–0.1)
Immature Granulocytes: 1 %
Lymphocytes Absolute: 3.9 10*3/uL — ABNORMAL HIGH (ref 0.7–3.1)
Lymphs: 32 %
MCH: 34.3 pg — ABNORMAL HIGH (ref 26.6–33.0)
MCHC: 32.9 g/dL (ref 31.5–35.7)
MCV: 104 fL — ABNORMAL HIGH (ref 79–97)
Monocytes Absolute: 1.6 10*3/uL — ABNORMAL HIGH (ref 0.1–0.9)
Monocytes: 13 %
Neutrophils Absolute: 6.4 10*3/uL (ref 1.4–7.0)
Neutrophils: 50 %
Platelets: 330 10*3/uL (ref 150–450)
RBC: 2.36 x10E6/uL — CL (ref 4.14–5.80)
RDW: 18.1 % — ABNORMAL HIGH (ref 11.6–15.4)
WBC: 12.4 10*3/uL — ABNORMAL HIGH (ref 3.4–10.8)

## 2018-12-04 LAB — OXYCODONE/OXYMORPHONE, CONFIRM
OXYCODONE/OXYMORPH: POSITIVE — AB
OXYCODONE: >3000 ng/mL
OXYCODONE: POSITIVE — AB
OXYMORPHONE (GC/MS): >3000 ng/mL
OXYMORPHONE: POSITIVE — AB

## 2018-12-04 LAB — DRUG SCREEN 764883 11+OXYCO+ALC+CRT-BUND
Amphetamines, Urine: NEGATIVE ng/mL
BENZODIAZ UR QL: NEGATIVE ng/mL
Barbiturate: NEGATIVE ng/mL
Cannabinoid Quant, Ur: NEGATIVE ng/mL
Cocaine (Metabolite): NEGATIVE ng/mL
Creatinine: 71.5 mg/dL (ref 20.0–300.0)
Ethanol: NEGATIVE %
Meperidine: NEGATIVE ng/mL
Methadone Screen, Urine: NEGATIVE ng/mL
Phencyclidine: NEGATIVE ng/mL
Propoxyphene: NEGATIVE ng/mL
Tramadol: NEGATIVE ng/mL
pH, Urine: 6.5 (ref 4.5–8.9)

## 2018-12-04 LAB — OPIATES CONFIRMATION, URINE: Opiates: NEGATIVE ng/mL

## 2018-12-12 ENCOUNTER — Other Ambulatory Visit: Payer: Self-pay | Admitting: Family Medicine

## 2018-12-12 ENCOUNTER — Telehealth: Payer: Self-pay | Admitting: Internal Medicine

## 2018-12-12 DIAGNOSIS — G894 Chronic pain syndrome: Secondary | ICD-10-CM

## 2018-12-12 DIAGNOSIS — D571 Sickle-cell disease without crisis: Secondary | ICD-10-CM

## 2018-12-12 MED ORDER — OXYCODONE HCL 30 MG PO TABS: 30.0000 mg | ORAL_TABLET | ORAL | 0 refills | Status: DC | PRN

## 2018-12-12 NOTE — Telephone Encounter (Signed)
Refill request for oxyodone 30mg .

## 2018-12-12 NOTE — Telephone Encounter (Signed)
Called and spoke with patient and made him aware that rx's for pain med will not be refilled early and to contact surgeon for pain related to surgery. Thanks!

## 2018-12-12 NOTE — Progress Notes (Signed)
   Reviewed PDMP prior to prescribing opiate medication, no inconsistencies noted. Patient calling requesting an over ride to get prescription filled earlier. He says that he had surgery and has been doubling up on medications. I will not approve request. He will need to contact surgeon for further instructions regarding post operative pain.   Meds ordered this encounter  Medications  . oxycodone (ROXICODONE) 30 MG immediate release tablet    Sig: Take 1 tablet (30 mg total) by mouth every 4 (four) hours as needed for up to 15 days for pain.    Dispense:  90 tablet    Refill:  0    Medication not to be filled prior to 12/16/2018    Order Specific Question:   Supervising Provider    Answer:   Tresa Garter [[id number]]     [place], MSN, FNP-C Patient [facility] [address] [place], [place] [address] [phone]

## 2018-12-12 NOTE — Telephone Encounter (Signed)
-----   Message from Dorena Dew, [place] sent at 12/12/2018 10:51 AM EST ----- Regarding: Medication request Please inform Mr. Gripp that I will not allow medications to be filled early. He needed to contact his surgeon for pain related to that particular surgery. He had this same request 15 days prior. How many surgeries has the patient undergone over the past month. I am ok with the surgeon prescribing additional medications to assist with surgery related pain.    Donia Pounds  APRN, MSN, FNP-C Patient [facility] [address] [place],  [address] [phone]

## 2018-12-13 ENCOUNTER — Other Ambulatory Visit: Payer: Self-pay

## 2018-12-13 MED ORDER — PROMETHAZINE HCL 12.5 MG PO TABS: 12.5000 mg | ORAL_TABLET | Freq: Three times a day (TID) | ORAL | 0 refills | Status: DC | PRN

## 2018-12-15 ENCOUNTER — Non-Acute Institutional Stay (HOSPITAL_COMMUNITY)
Admission: AD | Admit: 2018-12-15 | Discharge: 2018-12-15 | Disposition: A | Discharge status: Home / Self Care | Payer: Medicaid Other | Source: Ambulatory Visit | Attending: Internal Medicine | Admitting: Internal Medicine

## 2018-12-15 ENCOUNTER — Telehealth (HOSPITAL_COMMUNITY): Payer: Self-pay | Admitting: *Deleted

## 2018-12-15 ENCOUNTER — Encounter (HOSPITAL_COMMUNITY): Payer: Self-pay | Admitting: General Practice

## 2018-12-15 DIAGNOSIS — F17210 Nicotine dependence, cigarettes, uncomplicated: Secondary | ICD-10-CM | POA: Insufficient documentation

## 2018-12-15 DIAGNOSIS — M79631 Pain in right forearm: Secondary | ICD-10-CM | POA: Diagnosis not present

## 2018-12-15 DIAGNOSIS — D5700 Hb-SS disease with crisis, unspecified: Secondary | ICD-10-CM | POA: Insufficient documentation

## 2018-12-15 DIAGNOSIS — M545 Low back pain: Secondary | ICD-10-CM | POA: Diagnosis not present

## 2018-12-15 DIAGNOSIS — Z832 Family history of diseases of the blood and blood-forming organs and certain disorders involving the immune mechanism: Secondary | ICD-10-CM | POA: Diagnosis not present

## 2018-12-15 DIAGNOSIS — Z79899 Other long term (current) drug therapy: Secondary | ICD-10-CM | POA: Insufficient documentation

## 2018-12-15 DIAGNOSIS — Z880 Allergy status to penicillin: Secondary | ICD-10-CM | POA: Insufficient documentation

## 2018-12-15 DIAGNOSIS — Z881 Allergy status to other antibiotic agents status: Secondary | ICD-10-CM | POA: Diagnosis not present

## 2018-12-15 DIAGNOSIS — G894 Chronic pain syndrome: Secondary | ICD-10-CM | POA: Diagnosis not present

## 2018-12-15 DIAGNOSIS — Z79891 Long term (current) use of opiate analgesic: Secondary | ICD-10-CM | POA: Diagnosis not present

## 2018-12-15 LAB — RETICULOCYTES
Immature Retic Fract: 25.4 % — ABNORMAL HIGH (ref 2.3–15.9)
RBC.: 2.26 MIL/uL — ABNORMAL LOW (ref 4.22–5.81)
Retic Count, Absolute: 297.2 10*3/uL — ABNORMAL HIGH (ref 19.0–186.0)
Retic Ct Pct: 13.2 % — ABNORMAL HIGH (ref 0.4–3.1)

## 2018-12-15 LAB — CBC WITH DIFFERENTIAL/PLATELET
Abs Immature Granulocytes: 0.1 K/uL — ABNORMAL HIGH (ref 0.00–0.07)
Basophils Absolute: 0.1 K/uL (ref 0.0–0.1)
Basophils Relative: 1 %
Eosinophils Absolute: 0.2 K/uL (ref 0.0–0.5)
Eosinophils Relative: 2 %
HCT: 23.4 % — ABNORMAL LOW (ref 39.0–52.0)
Hemoglobin: 8 g/dL — ABNORMAL LOW (ref 13.0–17.0)
Immature Granulocytes: 1 %
Lymphocytes Relative: 19 %
Lymphs Abs: 2.4 K/uL (ref 0.7–4.0)
MCH: 35.4 pg — ABNORMAL HIGH (ref 26.0–34.0)
MCHC: 34.2 g/dL (ref 30.0–36.0)
MCV: 103.5 fL — ABNORMAL HIGH (ref 80.0–100.0)
Monocytes Absolute: 1.4 K/uL — ABNORMAL HIGH (ref 0.1–1.0)
Monocytes Relative: 11 %
Neutro Abs: 8.6 K/uL — ABNORMAL HIGH (ref 1.7–7.7)
Neutrophils Relative %: 66 %
Platelets: 286 K/uL (ref 150–400)
RBC: 2.26 MIL/uL — ABNORMAL LOW (ref 4.22–5.81)
RDW: 17.9 % — ABNORMAL HIGH (ref 11.5–15.5)
WBC: 12.7 K/uL — ABNORMAL HIGH (ref 4.0–10.5)
nRBC: 1.1 % — ABNORMAL HIGH (ref 0.0–0.2)

## 2018-12-15 LAB — COMPREHENSIVE METABOLIC PANEL
ALT: 13 U/L (ref 0–44)
AST: 24 U/L (ref 15–41)
Albumin: 4.6 g/dL (ref 3.5–5.0)
Alkaline Phosphatase: 70 U/L (ref 38–126)
Anion gap: 6 (ref 5–15)
BUN: 6 mg/dL (ref 6–20)
CO2: 27 mmol/L (ref 22–32)
Calcium: 9.3 mg/dL (ref 8.9–10.3)
Chloride: 107 mmol/L (ref 98–111)
Creatinine, Ser: 0.44 mg/dL — ABNORMAL LOW (ref 0.61–1.24)
GFR calc Af Amer: >60 mL/min (ref >60)
GFR calc non Af Amer: >60 mL/min (ref >60)
Glucose, Bld: 77 mg/dL (ref 70–99)
Potassium: 3.9 mmol/L (ref 3.5–5.1)
Sodium: 140 mmol/L (ref 135–145)
Total Bilirubin: 5.5 mg/dL — ABNORMAL HIGH (ref 0.3–1.2)
Total Protein: 8.3 g/dL — ABNORMAL HIGH (ref 6.5–8.1)

## 2018-12-15 MED ORDER — OXYCODONE HCL ER 15 MG PO T12A: 15.0000 mg | EXTENDED_RELEASE_TABLET | Freq: Once | ORAL | Status: DC

## 2018-12-15 MED ORDER — NALOXONE HCL 0.4 MG/ML IJ SOLN: 0.4000 mg | INTRAMUSCULAR | Status: DC | PRN

## 2018-12-15 MED ORDER — ONDANSETRON HCL 4 MG/2ML IJ SOLN: 4.0000 mg | Freq: Four times a day (QID) | INTRAMUSCULAR | Status: DC | PRN

## 2018-12-15 MED ORDER — OXYCODONE HCL 5 MG PO TABS
20.0000 mg | ORAL_TABLET | Freq: Once | ORAL | Status: AC
  Administered 2018-12-15: 20 mg via ORAL
  Filled 2018-12-15: qty 4

## 2018-12-15 MED ORDER — DIPHENHYDRAMINE HCL 25 MG PO CAPS: 25.0000 mg | ORAL_CAPSULE | ORAL | Status: DC | PRN

## 2018-12-15 MED ORDER — KETOROLAC TROMETHAMINE 30 MG/ML IJ SOLN
15.0000 mg | Freq: Once | INTRAMUSCULAR | Status: AC
  Administered 2018-12-15: 15 mg via INTRAVENOUS
  Filled 2018-12-15: qty 1

## 2018-12-15 MED ORDER — DEXTROSE-NACL 5-0.45 % IV SOLN
INTRAVENOUS | Status: DC
  Administered 2018-12-15: 12:00:00 via INTRAVENOUS

## 2018-12-15 MED ORDER — SODIUM CHLORIDE 0.9 % IV SOLN
25.0000 mg | INTRAVENOUS | Status: DC | PRN
  Filled 2018-12-15: qty 0.5

## 2018-12-15 MED ORDER — SODIUM CHLORIDE 0.9% FLUSH: 9.0000 mL | INTRAVENOUS | Status: DC | PRN

## 2018-12-15 MED ORDER — ACETAMINOPHEN 500 MG PO TABS
1000.0000 mg | ORAL_TABLET | Freq: Once | ORAL | Status: AC
  Administered 2018-12-15: 1000 mg via ORAL
  Filled 2018-12-15: qty 2

## 2018-12-15 MED ORDER — HYDROMORPHONE HCL 2 MG/ML IJ SOLN: 2.0000 mg | Freq: Once | INTRAMUSCULAR | Status: DC

## 2018-12-15 MED ORDER — HYDROMORPHONE 1 MG/ML IV SOLN
INTRAVENOUS | Status: DC
  Administered 2018-12-15: 6 mg via INTRAVENOUS
  Administered 2018-12-15: 30 mg via INTRAVENOUS
  Filled 2018-12-15: qty 30

## 2018-12-15 NOTE — Discharge Instructions (Signed)
Sickle Cell Anemia, Adult ° °Sickle cell anemia is a condition where your red blood cells are shaped like sickles. Red blood cells carry oxygen through the body. Sickle-shaped cells do not live as long as normal red blood cells. They also clump together and block blood from flowing through the blood vessels. This prevents the body from getting enough oxygen. Sickle cell anemia causes organ damage and pain. It also increases the risk of infection. °Follow these instructions at home: °Medicines °· Take over-the-counter and prescription medicines only as told by your doctor. °· If you were prescribed an antibiotic medicine, take it as told by your doctor. Do not stop taking the antibiotic even if you start to feel better. °· If you develop a fever, do not take medicines to lower the fever right away. Tell your doctor about the fever. °Managing pain, stiffness, and swelling °· Try these methods to help with pain: °? Use a heating pad. °? Take a warm bath. °? Distract yourself, such as by watching TV. °Eating and drinking °· Drink enough fluid to keep your pee (urine) clear or pale yellow. Drink more in hot weather and during exercise. °· Limit or avoid alcohol. °· Eat a healthy diet. Eat plenty of fruits, vegetables, whole grains, and lean protein. °· Take vitamins and supplements as told by your doctor. °Traveling °· When traveling, keep these with you: °? Your medical information. °? The names of your doctors. °? Your medicines. °· If you need to take an airplane, talk to your doctor first. °Activity °· Rest often. °· Avoid exercises that make your heart beat much faster, such as jogging. °General instructions °· Do not use products that have nicotine or tobacco, such as cigarettes and e-cigarettes. If you need help quitting, ask your doctor. °· Consider wearing a medical alert bracelet. °· Avoid being in high places (high altitudes), such as mountains. °· Avoid very hot or cold temperatures. °· Avoid places where the  temperature changes a lot. °· Keep all follow-up visits as told by your doctor. This is important. °Contact a doctor if: °· A joint hurts. °· Your feet or hands hurt or swell. °· You feel tired (fatigued). °Get help right away if: °· You have symptoms of infection. These include: °? Fever. °? Chills. °? Being very tired. °? Irritability. °? Poor eating. °? Throwing up (vomiting). °· You feel dizzy or faint. °· You have new stomach pain, especially on the left side. °· You have a an erection (priapism) that lasts more than 4 hours. °· You have numbness in your arms or legs. °· You have a hard time moving your arms or legs. °· You have trouble talking. °· You have pain that does not go away when you take medicine. °· You are short of breath. °· You are breathing fast. °· You have a long-term cough. °· You have pain in your chest. °· You have a bad headache. °· You have a stiff neck. °· Your stomach looks bloated even though you did not eat much. °· Your skin is pale. °· You suddenly cannot see well. °Summary °· Sickle cell anemia is a condition where your red blood cells are shaped like sickles. °· Follow your doctor's advice on ways to manage pain, food to eat, activities to do, and steps to take for safe travel. °· Get medical help right away if you have any signs of infection, such as a fever. °This information is not intended to replace advice given to you by   your health care provider. Make sure you discuss any questions you have with your health care provider. °Document Released: 11/16/2012 Document Revised: 05/20/2018 Document Reviewed: 03/03/2016 °Elsevier Patient Education © 2020 [employer] ° °

## 2018-12-15 NOTE — Discharge Summary (Addendum)
[facility] Discharge Summary   Patient ID: Matthew Shannon MRN: [id number] DOB/AGE: [birth year] 30 y.o.  Admit date: 12/15/2018 Discharge date: 12/15/2018  Primary Care Physician:  [name], MD  Admission Diagnoses:  Active Problems:   Sickle cell pain crisis [facility])  Discharge Medications:  Allergies as of 12/15/2018      Reactions   Ceftriaxone Shortness Of Breath, Itching, Other (See Comments), Cough   Zosyn [piperacillin Sod-tazobactam So] Shortness Of Breath   SOB/rash/N/V Has patient had a PCN reaction causing immediate rash, facial/tongue/throat swelling, SOB or lightheadedness with hypotension: Y Has patient had a PCN reaction causing severe rash involving mucus membranes or skin necrosis: N Has patient had a PCN reaction that required hospitalization: Y Has patient had a PCN reaction occurring within the last 10 years: Y If all of the above answers are "NO", then may proceed with Cephalosporin use.      Medication List    TAKE these medications   folic acid 1 MG tablet Commonly known as: FOLVITE Take 1 tablet (1 mg total) by mouth daily.   hydroxyurea 500 MG capsule Commonly known as: HYDREA Take 2 capsules (1,000 mg total) by mouth daily. May take with food to minimize GI side effects.   ondansetron 4 MG disintegrating tablet Commonly known as: Zofran ODT Take 1 tablet (4 mg total) by mouth every 8 (eight) hours as needed.   oxycodone 30 MG immediate release tablet Commonly known as: ROXICODONE Take 1 tablet (30 mg total) by mouth every 4 (four) hours as needed for up to 15 days for pain. Start taking on: December 16, 2018   promethazine 12.5 MG tablet Commonly known as: PHENERGAN Take 1 tablet (12.5 mg total) by mouth every 8 (eight) hours as needed for nausea or vomiting.   sulfamethoxazole-trimethoprim 800-160 MG tablet Commonly known as: BACTRIM DS Take 1 tablet by mouth 2 (two) times daily.        Consults:   None  Significant Diagnostic Studies:  No results found.  History of present illness: Matthew Shannon, a 28 year old male with a medical history of sickle cell disease, chronic pain syndrome, opiate tolerance and dependence, tobacco dependence, and history of anemia of chronic disease presents complaining of low back and right upper extremity pain.  Patient states that low back pain has been increasing over the past several days.  Also, patient endorses pain to right forearm.  He is status post right upper extremity decompressive fasciotomy and is followed by surgeon. He is scheduled for follow up procedure in 1 week. He attributes low back pain to changes in weather and being out of home pain medication.  Patient says that he has been taking more medication than prescribed and ran out of home pain medications.  He says that he has been taking Tylenol and ibuprofen without sustained relief.  Patient rates pain as 9/10, constant, and aching.  He denies fever, chills, sick contacts, recent travel, or exposure to COVID-19.  Also, patient denies headache, shortness of breath, chest pain, dysuria, nausea, vomiting, or diarrhea.   [facility] Course:  Patient admitted to sickle cell day infusion center for management of pain crisis. All laboratory values reviewed.  Hemoglobin 8.0, consistent with patient's baseline.  No clinical indication for blood transfusion at this time.  WBCs 12.7, patient afebrile, no signs of infection or inflammation.  Pain managed with IV Dilaudid via PCA per weight-based protocol.  Settings of 0.5 mg, 10-minute lockout, and 3 mg/h. IV  fluids, D5 0.45% saline at 125 mL/h IV Toradol 15 mg x 1 Tylenol 1000 mg by mouth x1 Oxycodone 20 mg by mouth x1  Pain intensity decreased to 7/10.  Patient does not warrant admission at this time.  Patient will be able to fill pain medication on 12/16/2018.  Patient requested to pick up medication early He is alert, oriented, and  ambulating without assistance. Buddie will discharge home in a hemodynamically stable condition.  Discharge instructions: Resume all home medications.  Follow up with PCP as previously  scheduled.   Discussed the importance of drinking 64 ounces of water daily to  help prevent pain crises, it is important to drink plenty of water throughout the day. This is because dehydration of red blood cells may lead further sickling.   Avoid all stressors that precipitate sickle cell pain crisis.     The patient was given clear instructions to go to ER or return to medical center if symptoms do not improve, worsen or new problems develop.      Physical Exam at Discharge:  BP 120/71 (BP Location: Left Arm)   Pulse 79   Temp 99 F (37.2 C) (Oral)   Resp 18   Wt 140 lb (63.5 kg)   SpO2 97%   BMI 20.09 kg/m  Physical Exam Constitutional:      Appearance: Normal appearance.  HENT:     Head: Normocephalic.     Mouth/Throat:     Mouth: Mucous membranes are moist.  Eyes:     Pupils: Pupils are equal, round, and reactive to light.  Cardiovascular:     Rate and Rhythm: Normal rate and regular rhythm.     Pulses: Normal pulses.  Pulmonary:     Effort: Pulmonary effort is normal.     Breath sounds: Normal breath sounds.  Abdominal:     General: Abdomen is flat. Bowel sounds are normal.  Skin:    General: Skin is warm.  Neurological:     General: No focal deficit present.     Mental Status: He is alert and oriented to person, place, and time.      Disposition at Discharge: Discharge disposition: 01-Home or Self Care       Discharge Orders: Discharge Instructions    Discharge patient   Complete by: As directed    Discharge disposition: 01-Home or Self Care   Discharge patient date: 12/15/2018      Condition at Discharge:   Stable  Time spent on Discharge:  Greater than 30 minutes.  Signed: Donia Pounds  APRN, MSN, FNP-C [facility] [address] [place], [place] [address] [phone]  12/15/2018, 4:45 PM   This note was prepared using Dragon speech recognition software, errors in dictation are unintentional.

## 2018-12-15 NOTE — Progress Notes (Addendum)
Patient admitted to the day hospital for treatment of sickle cell pain crisis. Patient reported pain rated 8/10 in the back and arms. Patient placed on Dilaudid PCA, given IV Toradol, PO Oxycodone, PO Tylenol and hydrated with IV fluids. At discharge patient reported  pain at 8/10. Discharge instructions given to patient. Patient alert, oriented and ambulatory at discharge.

## 2018-12-15 NOTE — BH Specialist Note (Signed)
[facility] Referral Note  Reason for Referral: Matthew Shannon is a 28 y.o. male  Pt was referred by clinic for: transportation   Pt reports the following concerns: unreliable transportation to appointments; no transportation to day hospital today  Assessment: Patient does not have transportation to the day hospital today.  Plan: 1. Addressed today: Enrolled patient in [facility] transportation services, to be used for visits to the day hospital or PCP appointments here at the [facility].   2. Referral: [facility] Transportation department  3. Follow up: as needed from clinic  Estanislado Emms, [facility] [phone]

## 2018-12-15 NOTE — Telephone Encounter (Signed)
Patient called requesting to come to the day hospital for sickle cell pain. Patient reports pain in back and right arm rated 8/10. Reports having a recent surgery on right arm. Reports being out of pain medications and last taking Oxycodone two days ago. COVID-19 screening done and patient denies all symptoms. Denies fever, chest pain, nausea, vomiting, diarrhea, abdominal pain and priapism. Patient care center will provide transportation to the patient. [place], [place] notified. Patient can come to the day hospital for pain management. Patient advised and expresses an understanding.
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
## 2018-12-22 ENCOUNTER — Telehealth (HOSPITAL_COMMUNITY): Payer: Self-pay | Admitting: General Practice

## 2018-12-22 ENCOUNTER — Other Ambulatory Visit: Payer: Self-pay

## 2018-12-22 ENCOUNTER — Encounter (HOSPITAL_COMMUNITY): Payer: Self-pay | Admitting: General Practice

## 2018-12-22 ENCOUNTER — Inpatient Hospital Stay (HOSPITAL_COMMUNITY)
Admission: AD | Admit: 2018-12-22 | Discharge: 2018-12-27 | DRG: 812 | Disposition: A | Discharge status: Home / Self Care | Payer: Medicaid Other | Source: Ambulatory Visit | Attending: Internal Medicine | Admitting: Internal Medicine

## 2018-12-22 DIAGNOSIS — D72823 Leukemoid reaction: Secondary | ICD-10-CM

## 2018-12-22 DIAGNOSIS — D5700 Hb-SS disease with crisis, unspecified: Secondary | ICD-10-CM | POA: Diagnosis present

## 2018-12-22 DIAGNOSIS — Z881 Allergy status to other antibiotic agents status: Secondary | ICD-10-CM | POA: Diagnosis not present

## 2018-12-22 DIAGNOSIS — Z79899 Other long term (current) drug therapy: Secondary | ICD-10-CM | POA: Diagnosis not present

## 2018-12-22 DIAGNOSIS — Z20828 Contact with and (suspected) exposure to other viral communicable diseases: Secondary | ICD-10-CM | POA: Diagnosis present

## 2018-12-22 DIAGNOSIS — Z720 Tobacco use: Secondary | ICD-10-CM | POA: Diagnosis not present

## 2018-12-22 DIAGNOSIS — D638 Anemia in other chronic diseases classified elsewhere: Secondary | ICD-10-CM | POA: Diagnosis present

## 2018-12-22 DIAGNOSIS — M549 Dorsalgia, unspecified: Secondary | ICD-10-CM | POA: Diagnosis present

## 2018-12-22 DIAGNOSIS — D72829 Elevated white blood cell count, unspecified: Secondary | ICD-10-CM | POA: Diagnosis present

## 2018-12-22 DIAGNOSIS — F17210 Nicotine dependence, cigarettes, uncomplicated: Secondary | ICD-10-CM | POA: Diagnosis present

## 2018-12-22 DIAGNOSIS — G894 Chronic pain syndrome: Secondary | ICD-10-CM | POA: Diagnosis present

## 2018-12-22 DIAGNOSIS — D57219 Sickle-cell/Hb-C disease with crisis, unspecified: Secondary | ICD-10-CM | POA: Diagnosis not present

## 2018-12-22 DIAGNOSIS — Z880 Allergy status to penicillin: Secondary | ICD-10-CM | POA: Diagnosis not present

## 2018-12-22 DIAGNOSIS — G8929 Other chronic pain: Secondary | ICD-10-CM | POA: Diagnosis present

## 2018-12-22 LAB — CBC WITH DIFFERENTIAL/PLATELET
Abs Immature Granulocytes: 0.15 10*3/uL — ABNORMAL HIGH (ref 0.00–0.07)
Basophils Absolute: 0.1 10*3/uL (ref 0.0–0.1)
Basophils Relative: 1 %
Eosinophils Absolute: 0.2 10*3/uL (ref 0.0–0.5)
Eosinophils Relative: 1 %
HCT: 24.7 % — ABNORMAL LOW (ref 39.0–52.0)
Hemoglobin: 8.2 g/dL — ABNORMAL LOW (ref 13.0–17.0)
Immature Granulocytes: 1 %
Lymphocytes Relative: 22 %
Lymphs Abs: 2.8 10*3/uL (ref 0.7–4.0)
MCH: 34.6 pg — ABNORMAL HIGH (ref 26.0–34.0)
MCHC: 33.2 g/dL (ref 30.0–36.0)
MCV: 104.2 fL — ABNORMAL HIGH (ref 80.0–100.0)
Monocytes Absolute: 1.4 10*3/uL — ABNORMAL HIGH (ref 0.1–1.0)
Monocytes Relative: 11 %
Neutro Abs: 8.3 10*3/uL — ABNORMAL HIGH (ref 1.7–7.7)
Neutrophils Relative %: 64 %
Platelets: 297 10*3/uL (ref 150–400)
RBC: 2.37 MIL/uL — ABNORMAL LOW (ref 4.22–5.81)
RDW: 19.6 % — ABNORMAL HIGH (ref 11.5–15.5)
WBC: 13 10*3/uL — ABNORMAL HIGH (ref 4.0–10.5)
nRBC: 1.5 % — ABNORMAL HIGH (ref 0.0–0.2)

## 2018-12-22 LAB — COMPREHENSIVE METABOLIC PANEL
ALT: 17 U/L (ref 0–44)
AST: 31 U/L (ref 15–41)
Albumin: 4.2 g/dL (ref 3.5–5.0)
Alkaline Phosphatase: 74 U/L (ref 38–126)
Anion gap: 9 (ref 5–15)
BUN: 8 mg/dL (ref 6–20)
CO2: 20 mmol/L — ABNORMAL LOW (ref 22–32)
Calcium: 9 mg/dL (ref 8.9–10.3)
Chloride: 108 mmol/L (ref 98–111)
Creatinine, Ser: 0.55 mg/dL — ABNORMAL LOW (ref 0.61–1.24)
GFR calc Af Amer: >60 mL/min (ref >60)
GFR calc non Af Amer: >60 mL/min (ref >60)
Glucose, Bld: 87 mg/dL (ref 70–99)
Potassium: 4.7 mmol/L (ref 3.5–5.1)
Sodium: 137 mmol/L (ref 135–145)
Total Bilirubin: 3.8 mg/dL — ABNORMAL HIGH (ref 0.3–1.2)
Total Protein: 8 g/dL (ref 6.5–8.1)

## 2018-12-22 LAB — RETICULOCYTES
Immature Retic Fract: 32.6 % — ABNORMAL HIGH (ref 2.3–15.9)
RBC.: 2.37 MIL/uL — ABNORMAL LOW (ref 4.22–5.81)
Retic Count, Absolute: 420.7 10*3/uL — ABNORMAL HIGH (ref 19.0–186.0)
Retic Ct Pct: 17.8 % — ABNORMAL HIGH (ref 0.4–3.1)

## 2018-12-22 LAB — SARS CORONAVIRUS 2 (TAT 6-24 HRS): SARS Coronavirus 2: NEGATIVE

## 2018-12-22 MED ORDER — HYDROMORPHONE HCL 2 MG/ML IJ SOLN
2.0000 mg | Freq: Once | INTRAMUSCULAR | Status: DC
  Filled 2018-12-22: qty 1

## 2018-12-22 MED ORDER — KETOROLAC TROMETHAMINE 30 MG/ML IJ SOLN
15.0000 mg | Freq: Once | INTRAMUSCULAR | Status: AC
  Administered 2018-12-22: 15 mg via INTRAVENOUS
  Filled 2018-12-22: qty 1

## 2018-12-22 MED ORDER — SODIUM CHLORIDE 0.45 % IV SOLN
INTRAVENOUS | Status: DC
  Administered 2018-12-22 – 2018-12-26 (×5): via INTRAVENOUS

## 2018-12-22 MED ORDER — ACETAMINOPHEN 500 MG PO TABS
1000.0000 mg | ORAL_TABLET | Freq: Once | ORAL | Status: AC
  Administered 2018-12-22: 10:00:00 1000 mg via ORAL
  Filled 2018-12-22: qty 2

## 2018-12-22 MED ORDER — SODIUM CHLORIDE 0.9% FLUSH: 9.0000 mL | INTRAVENOUS | Status: DC | PRN

## 2018-12-22 MED ORDER — HYDROXYUREA 500 MG PO CAPS
1000.0000 mg | ORAL_CAPSULE | Freq: Every day | ORAL | Status: DC
  Administered 2018-12-23 – 2018-12-26 (×4): 1000 mg via ORAL
  Filled 2018-12-22 (×5): qty 2

## 2018-12-22 MED ORDER — HYDROMORPHONE 1 MG/ML IV SOLN
INTRAVENOUS | Status: DC
  Administered 2018-12-22: 9.3 mg via INTRAVENOUS
  Administered 2018-12-22: 30 mg via INTRAVENOUS
  Administered 2018-12-22: 10.5 mg via INTRAVENOUS
  Administered 2018-12-22: 11 mg via INTRAVENOUS
  Administered 2018-12-22: 21:00:00 via INTRAVENOUS
  Administered 2018-12-23: 8.5 mg via INTRAVENOUS
  Administered 2018-12-23: 12 mg via INTRAVENOUS
  Administered 2018-12-23: 13.5 mg via INTRAVENOUS
  Administered 2018-12-23: 30 mg via INTRAVENOUS
  Administered 2018-12-23: 1.5 mg via INTRAVENOUS
  Administered 2018-12-23: 5.5 mg via INTRAVENOUS
  Administered 2018-12-23: 30 mg via INTRAVENOUS
  Administered 2018-12-23: 14.5 mg via INTRAVENOUS
  Administered 2018-12-24: 13 mg via INTRAVENOUS
  Administered 2018-12-24 (×2): 30 mg via INTRAVENOUS
  Administered 2018-12-24: 0.5 mg via INTRAVENOUS
  Administered 2018-12-24: 9 mg via INTRAVENOUS
  Administered 2018-12-24: 11.5 mg via INTRAVENOUS
  Administered 2018-12-24: 11 mg via INTRAVENOUS
  Administered 2018-12-25: 7.5 mg via INTRAVENOUS
  Administered 2018-12-25: 7 mg via INTRAVENOUS
  Administered 2018-12-25: 30 mg via INTRAVENOUS
  Administered 2018-12-25: 6 mg via INTRAVENOUS
  Administered 2018-12-25: 15.5 mg via INTRAVENOUS
  Administered 2018-12-25: 30 mg via INTRAVENOUS
  Administered 2018-12-25: 9.5 mg via INTRAVENOUS
  Administered 2018-12-25: 12 mg via INTRAVENOUS
  Administered 2018-12-26: 7.5 mg via INTRAVENOUS
  Administered 2018-12-26: 30 mg via INTRAVENOUS
  Administered 2018-12-26: 17 mg via INTRAVENOUS
  Filled 2018-12-22 (×12): qty 30

## 2018-12-22 MED ORDER — POLYETHYLENE GLYCOL 3350 17 G PO PACK: 17.0000 g | PACK | Freq: Every day | ORAL | Status: DC | PRN

## 2018-12-22 MED ORDER — ENOXAPARIN SODIUM 40 MG/0.4ML ~~LOC~~ SOLN
40.0000 mg | SUBCUTANEOUS | Status: DC
  Filled 2018-12-22: qty 0.4

## 2018-12-22 MED ORDER — DIPHENHYDRAMINE HCL 25 MG PO CAPS
25.0000 mg | ORAL_CAPSULE | ORAL | Status: DC | PRN
  Filled 2018-12-22: qty 1

## 2018-12-22 MED ORDER — NALOXONE HCL 0.4 MG/ML IJ SOLN: 0.4000 mg | INTRAMUSCULAR | Status: DC | PRN

## 2018-12-22 MED ORDER — ONDANSETRON HCL 4 MG/2ML IJ SOLN: 4.0000 mg | Freq: Four times a day (QID) | INTRAMUSCULAR | Status: DC | PRN

## 2018-12-22 MED ORDER — KETOROLAC TROMETHAMINE 15 MG/ML IJ SOLN
15.0000 mg | Freq: Four times a day (QID) | INTRAMUSCULAR | Status: DC
  Administered 2018-12-22 – 2018-12-27 (×18): 15 mg via INTRAVENOUS
  Filled 2018-12-22 (×20): qty 1

## 2018-12-22 MED ORDER — SENNOSIDES-DOCUSATE SODIUM 8.6-50 MG PO TABS
1.0000 | ORAL_TABLET | Freq: Two times a day (BID) | ORAL | Status: DC
  Administered 2018-12-22 – 2018-12-26 (×2): 1 via ORAL
  Filled 2018-12-22 (×5): qty 1

## 2018-12-22 MED ORDER — OXYCODONE HCL 5 MG PO TABS
20.0000 mg | ORAL_TABLET | Freq: Four times a day (QID) | ORAL | Status: DC | PRN
  Administered 2018-12-22 – 2018-12-26 (×15): 20 mg via ORAL
  Filled 2018-12-22 (×15): qty 4

## 2018-12-22 MED ORDER — FOLIC ACID 1 MG PO TABS
1.0000 mg | ORAL_TABLET | Freq: Every day | ORAL | Status: DC
  Administered 2018-12-23 – 2018-12-26 (×4): 1 mg via ORAL
  Filled 2018-12-22 (×5): qty 1

## 2018-12-22 NOTE — Progress Notes (Signed)
Patient admitted to the day hospital for treatment of sickle cell pain crisis. Patient reported pain rated 8/10 in the back and legs. Patient placed on Dilaudid PCA, given IV Toradol, PO Tylenol and hydrated with IV fluids. Patient transferred to [facility] room 14. Report given to [facility]. Patient's  pain is still  8/10.  Patient alert, oriented and transported in a wheelchair on transfer.
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
## 2018-12-22 NOTE — Telephone Encounter (Signed)
Patient came to the [facility] waiting room complained of pain in the    Back and legs rated at 8/10. Denied chest pain, fever, diarrhea, abdominal pain, nausea/vomitting. Screened negative for Covid-19 symptoms. Admitted to having means of transportation without driving self after treatment. Last took oxycodone 30 mg at 20:00 on 12/21/2018. Per provider, patient can be admitted to the day hospital for treatment. Patient notified, verbalized understanding.

## 2018-12-23 DIAGNOSIS — Z720 Tobacco use: Secondary | ICD-10-CM

## 2018-12-23 DIAGNOSIS — D72823 Leukemoid reaction: Secondary | ICD-10-CM

## 2018-12-23 DIAGNOSIS — G894 Chronic pain syndrome: Secondary | ICD-10-CM

## 2018-12-23 MED ORDER — SODIUM CHLORIDE 0.9% FLUSH: 10.0000 mL | INTRAVENOUS | Status: DC | PRN

## 2018-12-23 MED ORDER — ADULT MULTIVITAMIN W/MINERALS CH
1.0000 | ORAL_TABLET | Freq: Every day | ORAL | Status: DC
  Administered 2018-12-24 – 2018-12-26 (×2): 1 via ORAL
  Filled 2018-12-23 (×3): qty 1

## 2018-12-23 MED ORDER — SODIUM CHLORIDE 0.9% FLUSH
10.0000 mL | Freq: Two times a day (BID) | INTRAVENOUS | Status: DC
  Administered 2018-12-23: 10 mL

## 2018-12-23 MED ORDER — ENSURE ENLIVE PO LIQD
237.0000 mL | Freq: Two times a day (BID) | ORAL | Status: DC
  Administered 2018-12-23 – 2018-12-26 (×8): 237 mL via ORAL

## 2018-12-23 NOTE — Progress Notes (Signed)
Initial Nutrition Assessment  DOCUMENTATION CODES:   Underweight, suspect malnutrition but unable to confirm without NFPE  INTERVENTION:   - Ensure Enlive po BID, each supplement provides 350 kcal and 20 grams of protein  - MVI with minerals daily  NUTRITION DIAGNOSIS:   Increased nutrient needs related to acute illness as evidenced by estimated needs.  GOAL:   Patient will meet greater than or equal to 90% of their needs  MONITOR:   PO intake, Supplement acceptance, Labs, Weight trends  REASON FOR ASSESSMENT:   Other (underweight BMI)    ASSESSMENT:   28 year old male with PMH of sickle cell disease, chronic pain syndrome, opiate tolerance and dependence, tobacco dependence, polysubstance abuse, and anemia of chronic disease. Pt presents complaining of low back and lower extremity pain that is consistent with pain crisis. Pt also endorses pain to right forearm, he is s/p right upper extremity decompressive fasciotomy.   Spoke with pt at bedside. Pt reports having a good appetite and eating well. Pt has not noticed any changes in his appetite or in his eating patterns. Pt reports that he eats 3 full meals daily with snacks in between.  Pt endorses weight loss but is unsure why. When asked if it is possible that his PO intake has decreased due to pain, pt reports that this may be the case. Pt reports his UBW as 130-135 lbs.  Reviewed weight history in chart. Pt with a 6.4 kg weight loss since 10/10/18. This is a 10.5% weight loss in less than 3 months which is significant for timeframe. Suspect pt with malnutrition but unable to confirm without NFPE. Pt declined NFPE due to pain.  Pt has tried oral nutrition supplements before and is amenable to RD ordering Ensure Enlive during admission. Will also order daily MVI.  Meal Completion: 75-100% x 2 meals  Medications reviewed and include: folic acid, Senna IVF: 1/2NS @ 50 ml/hr  Labs reviewed: hemoglobin 8.2  NUTRITION -  FOCUSED PHYSICAL EXAM:  Pt declined due to pain.  Diet Order:   Diet Order            Diet regular Room service appropriate? Yes  Diet effective now              EDUCATION NEEDS:   No education needs have been identified at this time  Skin:  Skin Assessment: Reviewed RN Assessment  Last BM:  12/22/18  Height:   Ht Readings from Last 1 Encounters:  12/22/18 5\' 11"  (1.803 m)    Weight:   Wt Readings from Last 1 Encounters:  12/22/18 54.8 kg    Ideal Body Weight:  78.2 kg  BMI:  Body mass index is 16.86 kg/m.  Estimated Nutritional Needs:   Kcal:  2100-2300  Protein:  90-110 grams  Fluid:  >/= 2.0 L    Gaynell Face, MS, RD, LDN Inpatient Clinical Dietitian Pager: [phone] Weekend/After Hours: [phone]

## 2018-12-23 NOTE — Progress Notes (Signed)
Subjective: Matthew Shannon, a 28 year old male with a medical history significant for sickle cell disease, chronic pain syndrome, opiate dependence and tolerance, history of polysubstance abuse, and history of anemia of chronic disease was admitted for sickle cell pain crisis.  Seon says that his pain has not improved overnight. Pain intensity is 8/10 primarily to low back and lower extremities.   Patient denies headache, dizziness, chest pain, paresthesias, urinary symptoms, vomiting or diarrhea. Patient endorses nausea.   Objective:  Vital signs in last 24 hours:  Vitals:   12/23/18 0503 12/23/18 0806 12/23/18 0940 12/23/18 1001  BP:    101/65  Pulse:    72  Resp: 13 12 12 12   Temp:    98.3 F (36.8 C)  TempSrc:    Oral  SpO2: 93% 91% 92% 94%  Weight:      Height:        Intake/Output from previous day:   Intake/Output Summary (Last 24 hours) at 12/23/2018 1016 Last data filed at 12/23/2018 0900 Gross per 24 hour  Intake 1656.89 ml  Output 2 ml  Net 1654.89 ml    Physical Exam: General: Alert, awake, oriented x3, in no acute distress.  HEENT: [place]/AT [place]ERL, EOMI Neck: Trachea midline,  no masses, no thyromegal,y no JVD, no carotid bruit OROPHARYNX:  Moist, No exudate/ erythema/lesions.  Heart: Regular rate and rhythm, without murmurs, rubs, gallops, PMI non-displaced, no heaves or thrills on palpation.  Lungs: Clear to auscultation, no wheezing or rhonchi noted. No increased vocal fremitus resonant to percussion  Abdomen: Soft, nontender, nondistended, positive bowel sounds, no masses no hepatosplenomegaly noted..  Neuro: No focal neurological deficits noted cranial nerves II through XII grossly intact. DTRs 2+ bilaterally upper and lower extremities. Strength 5 out of 5 in bilateral upper and lower extremities. Musculoskeletal: No warm swelling or erythema around joints, no spinal tenderness noted. Psychiatric: Patient alert and oriented x3, good insight and cognition,  good recent to remote recall. Skin: Right arm dressing clean, dry, and intact. No drainage noted.   Lymph node survey: No cervical axillary or inguinal lymphadenopathy noted.  Lab Results:  Basic Metabolic Panel:    Component Value Date/Time   NA 137 12/22/2018 1749   NA 137 11/29/2018 1224   K 4.7 12/22/2018 1749   CL 108 12/22/2018 1749   CO2 20 (L) 12/22/2018 1749   BUN 8 12/22/2018 1749   BUN 5 (L) 11/29/2018 1224   CREATININE 0.55 (L) 12/22/2018 1749   GLUCOSE 87 12/22/2018 1749   CALCIUM 9.0 12/22/2018 1749   CBC:    Component Value Date/Time   WBC 13.0 (H) 12/22/2018 1125   HGB 8.2 (L) 12/22/2018 1125   HGB 8.1 (L) 11/29/2018 1224   HCT 24.7 (L) 12/22/2018 1125   HCT 24.6 (L) 11/29/2018 1224   PLT 297 12/22/2018 1125   PLT 330 11/29/2018 1224   MCV 104.2 (H) 12/22/2018 1125   MCV 104 (H) 11/29/2018 1224   NEUTROABS 8.3 (H) 12/22/2018 1125   NEUTROABS 6.4 11/29/2018 1224   LYMPHSABS 2.8 12/22/2018 1125   LYMPHSABS 3.9 (H) 11/29/2018 1224   MONOABS 1.4 (H) 12/22/2018 1125   EOSABS 0.2 12/22/2018 1125   EOSABS 0.3 11/29/2018 1224   BASOSABS 0.1 12/22/2018 1125   BASOSABS 0.1 11/29/2018 1224    Recent Results (from the past 240 hour(s))  SARS CORONAVIRUS 2 (TAT 6-24 HRS) Nasopharyngeal Nasopharyngeal Swab     Status: None   Collection Time: 12/22/18  4:57 PM  Specimen: Nasopharyngeal Swab  Result Value Ref Range Status   SARS Coronavirus 2 NEGATIVE NEGATIVE Final    Comment: (NOTE) SARS-CoV-2 target nucleic acids are NOT DETECTED. The SARS-CoV-2 RNA is generally detectable in upper and lower respiratory specimens during the acute phase of infection. Negative results do not preclude SARS-CoV-2 infection, do not rule out co-infections with other pathogens, and should not be used as the sole basis for treatment or other patient management decisions. Negative results must be combined with clinical observations, patient history, and epidemiological  information. The expected result is Negative. Fact Sheet for Patients: SugarRoll.be Fact Sheet for Healthcare Providers: https://www.woods-mathews.com/ This test is not yet approved or cleared by the [place] FDA and  has been authorized for detection and/or diagnosis of SARS-CoV-2 by FDA under an Emergency Use Authorization (EUA). This EUA will remain  in effect (meaning this test can be used) for the duration of the COVID-19 declaration under Section 56 4(b)(1) of the Act, 21 U.S.C. section 360bbb-3(b)(1), unless the authorization is terminated or revoked sooner. Performed at [facility], [place] [address], [place], [place] [address]     Studies/Results: No results found.  Medications: Scheduled Meds: . enoxaparin (LOVENOX) injection  40 mg Subcutaneous Q24H  . folic acid  1 mg Oral Daily  . HYDROmorphone   Intravenous Q4H  .  HYDROmorphone (DILAUDID) injection  2 mg Subcutaneous Once  . hydroxyurea  1,000 mg Oral Daily  . ketorolac  15 mg Intravenous Q6H  . senna-docusate  1 tablet Oral BID   Continuous Infusions: . sodium chloride 100 mL/hr at 12/23/18 0649   PRN Meds:.diphenhydrAMINE, naloxone **AND** sodium chloride flush, ondansetron (ZOFRAN) IV, oxycodone, polyethylene glycol  Consultants:  None  Procedures:  None  Antibiotics:  None  Assessment/Plan: Principal Problem:   Sickle cell pain crisis (HCC) Active Problems:   Tobacco abuse   Leukocytosis   Chronic pain  Sickle cell disease with pain crisis:  Continue IV dilaudid PCA, no changes in settings on  Today Oxycodone 20 mg every 6 hours for breakthrough pain Toradol 15 mg every 6 hours for a total of 5 days Decrease IV fluids 0.45% saline at 50 ml/hr Monitor vital signs closely and evaluate pain scale regularly.   Leukocytes:  WBCs mildly elevate. Patient continues to be afebrile, no signs of infection or inflammation.  Repeat CBC in  am  Chronic pain syndrome:  Continue home medications  Sickle cell anemia:  Stable. No clinical indication for blood transfusion at this time. Continue to follow CBC  Tobacco dependence:  Counseled at length. Nicotine patch offered, patient declined.   History of polysubstance abuse:  UDS pending  S/P Right arm decompressive fasciotomy:  Dressing clean, dry, and intact. No drainage noted. Patient is followed by [facility]   Code Status: Full Code Family Communication: N/A Disposition Plan: Not yet ready for discharge   [place], MSN, FNP-C Patient [place] [address] [place], [place] [address] [phone]  If 5PM-7AM, please contact night-coverage.  12/23/2018, 10:16 AM  LOS: 1 day

## 2018-12-24 LAB — CBC
HCT: 18.3 % — ABNORMAL LOW (ref 39.0–52.0)
Hemoglobin: 6.2 g/dL — CL (ref 13.0–17.0)
MCH: 34.1 pg — ABNORMAL HIGH (ref 26.0–34.0)
MCHC: 33.9 g/dL (ref 30.0–36.0)
MCV: 100.5 fL — ABNORMAL HIGH (ref 80.0–100.0)
Platelets: 223 10*3/uL (ref 150–400)
RBC: 1.82 MIL/uL — ABNORMAL LOW (ref 4.22–5.81)
RDW: 18.4 % — ABNORMAL HIGH (ref 11.5–15.5)
WBC: 13.6 10*3/uL — ABNORMAL HIGH (ref 4.0–10.5)
nRBC: 1.5 % — ABNORMAL HIGH (ref 0.0–0.2)

## 2018-12-24 LAB — PREPARE RBC (CROSSMATCH)

## 2018-12-24 LAB — HIV ANTIBODY (ROUTINE TESTING W REFLEX): HIV Screen 4th Generation wRfx: NONREACTIVE

## 2018-12-24 MED ORDER — SODIUM CHLORIDE 0.9% IV SOLUTION
Freq: Once | INTRAVENOUS | Status: AC
  Administered 2018-12-24: 18:00:00 via INTRAVENOUS

## 2018-12-24 MED ORDER — GABAPENTIN 300 MG PO CAPS
600.0000 mg | ORAL_CAPSULE | Freq: Three times a day (TID) | ORAL | Status: DC
  Administered 2018-12-24 – 2018-12-26 (×7): 600 mg via ORAL
  Filled 2018-12-24 (×7): qty 2

## 2018-12-24 NOTE — Progress Notes (Signed)
CRITICAL VALUE ALERT  Critical Value:  Hgb 6.25  Date & Time Notied:  12/24/18 at [place]  Provider Notified: Hospitalist  Orders Received/Actions taken: Pending MD

## 2018-12-24 NOTE — Progress Notes (Signed)
Patient ID: Matthew Shannon, male   DOB: 10-25-1990, 28 y.o.   MRN: [id number] Subjective: Matthew Shannon, a 28 year old male with a medical history significant for sickle cell disease, chronic pain syndrome, opiate dependence and tolerance, history of polysubstance abuse, and history of anemia of chronic disease was admitted for sickle cell pain crisis.  Patient claims his pain is not improved, he rates his pain at 8/10 mostly in his upper and lower extremities and also lower back. He denies any fever, headache, chest pain, cough, shortness of breath, nausea, vomiting or diarrhea. He is tolerating p.o. intake. He denies any urinary symptoms or change in bowel habit.  Objective:  Vital signs in last 24 hours:  Vitals:   12/24/18 0719 12/24/18 0755 12/24/18 1157 12/24/18 1220  BP:  (!) 97/57 117/71   Pulse:  72 92   Resp: 14 14 16 15   Temp:  98.6 F (37 C) 98.4 F (36.9 C)   TempSrc:  Oral Oral   SpO2: 95% 95% 96% 96%  Weight:      Height:        Intake/Output from previous day:   Intake/Output Summary (Last 24 hours) at 12/24/2018 1423 Last data filed at 12/24/2018 1100 Gross per 24 hour  Intake 3255 ml  Output -  Net 3255 ml    Physical Exam: General: Alert, awake, oriented x3, in no acute distress. Thin built HEENT: Matthew Shannon/AT [place], EOMI Neck: Trachea midline,  no masses, no thyromegal,y no JVD, no carotid bruit OROPHARYNX:  Moist, No exudate/ erythema/lesions.  Heart: Regular rate and rhythm, without murmurs, rubs, gallops, PMI non-displaced, no heaves or thrills on palpation.  Lungs: Clear to auscultation, no wheezing or rhonchi noted. No increased vocal fremitus resonant to percussion  Abdomen: Soft, nontender, nondistended, positive bowel sounds, no masses no hepatosplenomegaly noted..  Neuro: No focal neurological deficits noted cranial nerves II through XII grossly intact. DTRs 2+ bilaterally upper and lower extremities. Strength 5 out of 5 in bilateral upper and lower  extremities. Musculoskeletal: No warm swelling or erythema around joints, no spinal tenderness noted. Psychiatric: Patient alert and oriented x3, good insight and cognition, good recent to remote recall. Lymph node survey: No cervical axillary or inguinal lymphadenopathy noted.  Lab Results:  Basic Metabolic Panel:    Component Value Date/Time   NA 137 12/22/2018 1749   NA 137 11/29/2018 1224   K 4.7 12/22/2018 1749   CL 108 12/22/2018 1749   CO2 20 (L) 12/22/2018 1749   BUN 8 12/22/2018 1749   BUN 5 (L) 11/29/2018 1224   CREATININE 0.55 (L) 12/22/2018 1749   GLUCOSE 87 12/22/2018 1749   CALCIUM 9.0 12/22/2018 1749   CBC:    Component Value Date/Time   WBC 13.6 (H) 12/24/2018 0527   HGB 6.2 (LL) 12/24/2018 0527   HGB 8.1 (L) 11/29/2018 1224   HCT 18.3 (L) 12/24/2018 0527   HCT 24.6 (L) 11/29/2018 1224   PLT 223 12/24/2018 0527   PLT 330 11/29/2018 1224   MCV 100.5 (H) 12/24/2018 0527   MCV 104 (H) 11/29/2018 1224   NEUTROABS 8.3 (H) 12/22/2018 1125   NEUTROABS 6.4 11/29/2018 1224   LYMPHSABS 2.8 12/22/2018 1125   LYMPHSABS 3.9 (H) 11/29/2018 1224   MONOABS 1.4 (H) 12/22/2018 1125   EOSABS 0.2 12/22/2018 1125   EOSABS 0.3 11/29/2018 1224   BASOSABS 0.1 12/22/2018 1125   BASOSABS 0.1 11/29/2018 1224    Recent Results (from the past 240 hour(s))  SARS CORONAVIRUS 2 (  TAT 6-24 HRS) Nasopharyngeal Nasopharyngeal Swab     Status: None   Collection Time: 12/22/18  4:57 PM   Specimen: Nasopharyngeal Swab  Result Value Ref Range Status   SARS Coronavirus 2 NEGATIVE NEGATIVE Final    Comment: (NOTE) SARS-CoV-2 target nucleic acids are NOT DETECTED. The SARS-CoV-2 RNA is generally detectable in upper and lower respiratory specimens during the acute phase of infection. Negative results do not preclude SARS-CoV-2 infection, do not rule out co-infections with other pathogens, and should not be used as the sole basis for treatment or other patient management  decisions. Negative results must be combined with clinical observations, patient history, and epidemiological information. The expected result is Negative. Fact Sheet for Patients: HairSlick.no Fact Sheet for Healthcare Providers: quierodirigir.com This test is not yet approved or cleared by the [place] FDA and  has been authorized for detection and/or diagnosis of SARS-CoV-2 by FDA under an Emergency Use Authorization (EUA). This EUA will remain  in effect (meaning this test can be used) for the duration of the COVID-19 declaration under Section 56 4(b)(1) of the Act, 21 U.S.C. section 360bbb-3(b)(1), unless the authorization is terminated or revoked sooner. Performed at [facility], [address], [place], [place] [address]     Studies/Results: No results found.  Medications: Scheduled Meds: . sodium chloride   Intravenous Once  . enoxaparin (LOVENOX) injection  40 mg Subcutaneous Q24H  . feeding supplement (ENSURE ENLIVE)  237 mL Oral BID BM  . folic acid  1 mg Oral Daily  . HYDROmorphone   Intravenous Q4H  . hydroxyurea  1,000 mg Oral Daily  . ketorolac  15 mg Intravenous Q6H  . multivitamin with minerals  1 tablet Oral Daily  . senna-docusate  1 tablet Oral BID  . sodium chloride flush  10-40 mL Intracatheter Q12H   Continuous Infusions: . sodium chloride 10 mL/hr at 12/24/18 1420   PRN Meds:.diphenhydrAMINE, naloxone **AND** sodium chloride flush, ondansetron (ZOFRAN) IV, oxycodone, polyethylene glycol, sodium chloride flush  Consultants:  None  Procedures:  None  Antibiotics:  None  Assessment/Plan: Principal Problem:   Sickle cell pain crisis (HCC) Active Problems:   Tobacco abuse   Leukocytosis   Chronic pain  1. Hb Sickle Cell Disease with pain crisis: Continue IVF D5 .45% Saline @ KVO, continue weight based Dilaudid PCA, continue IV Toradol 15 mg Q 6 H, continue oxycodone 20 mg  every 6 hours for breakthrough pain, monitor vitals very closely, Re-evaluate pain scale regularly, 2 L of Oxygen by [place]. 2. Leukocytosis: White cell count slightly elevated, no fever, no evidence of infection or inflammation.  We will continue to monitor.  Repeat labs in a.m. 3. Sickle Cell Anemia: Hemoglobin has dropped significantly below baseline, will transfuse with 1 unit of packed red blood cells today.  Repeat labs in a.m. 4. Chronic pain Syndrome: Continue home pain medications.  Patient counseled extensively about pain and nonpharmacologic modalities of pain control. 5. Tobacco use disorder: Matthew Shannon was counseled on the dangers of tobacco use, and was advised to quit. Reviewed strategies to maximize success, including removing cigarettes and smoking materials from environment, stress management and support of family/friends. 6. History of polysubstance abuse: Patient has not yet given his urine for drug screening, encouraged to do so.  Patient verbalized understanding. 7. Status post right arm decompressive fasciotomy: Dressing is clean and dry, no evidence of infection.  Patient has scheduled follow-up with [facility].  Code Status: Full Code Family Communication: N/A  Disposition Plan: Not yet ready for discharge  Matthew Shannon  If 7PM-7AM, please contact night-coverage.  12/24/2018, 2:23 PM  LOS: 2 days

## 2018-12-25 LAB — CBC WITH DIFFERENTIAL/PLATELET
Abs Immature Granulocytes: 0.24 10*3/uL — ABNORMAL HIGH (ref 0.00–0.07)
Basophils Absolute: 0.1 10*3/uL (ref 0.0–0.1)
Basophils Relative: 1 %
Eosinophils Absolute: 0.6 10*3/uL — ABNORMAL HIGH (ref 0.0–0.5)
Eosinophils Relative: 4 %
HCT: 21.5 % — ABNORMAL LOW (ref 39.0–52.0)
Hemoglobin: 7.2 g/dL — ABNORMAL LOW (ref 13.0–17.0)
Immature Granulocytes: 2 %
Lymphocytes Relative: 19 %
Lymphs Abs: 2.6 10*3/uL (ref 0.7–4.0)
MCH: 32.9 pg (ref 26.0–34.0)
MCHC: 33.5 g/dL (ref 30.0–36.0)
MCV: 98.2 fL (ref 80.0–100.0)
Monocytes Absolute: 1.5 10*3/uL — ABNORMAL HIGH (ref 0.1–1.0)
Monocytes Relative: 11 %
Neutro Abs: 8.4 10*3/uL — ABNORMAL HIGH (ref 1.7–7.7)
Neutrophils Relative %: 63 %
Platelets: 250 10*3/uL (ref 150–400)
RBC: 2.19 MIL/uL — ABNORMAL LOW (ref 4.22–5.81)
RDW: 18.3 % — ABNORMAL HIGH (ref 11.5–15.5)
WBC: 13.5 10*3/uL — ABNORMAL HIGH (ref 4.0–10.5)
nRBC: 1.2 % — ABNORMAL HIGH (ref 0.0–0.2)

## 2018-12-25 LAB — BPAM RBC
Blood Product Expiration Date: 202012052359
ISSUE DATE / TIME: 202011141834
Unit Type and Rh: 5100

## 2018-12-25 LAB — TYPE AND SCREEN
ABO/RH(D): O POS
Antibody Screen: NEGATIVE
Donor AG Type: NEGATIVE
Unit division: 0

## 2018-12-25 MED ORDER — CHLORHEXIDINE GLUCONATE CLOTH 2 % EX PADS
6.0000 | MEDICATED_PAD | Freq: Every day | CUTANEOUS | Status: DC
  Administered 2018-12-25 – 2018-12-26 (×2): 6 via TOPICAL

## 2018-12-25 NOTE — Progress Notes (Signed)
Patient ID: Matthew Shannon, male   DOB: 1990-09-05, 28 y.o.   MRN: [id number] Subjective: Matthew Shannon, a 28 year old male with a medical history significant for sickle cell disease, chronic pain syndrome, opiate dependence and tolerance, history of polysubstance abuse, and history of anemia of chronic disease was admitted for sickle cell pain crisis.   Patient still continues to claim his pain is not controlled, rates his pain at 8/10 localized to his upper and lower extremities, and lower back.  He also claims he has no pain medications at home and he is not due for refill so his terrified if he were to be discharged home today.  He denies any fever, headache, chest pain, cough, shortness of breath, nausea, vomiting or diarrhea.  Objective:  Vital signs in last 24 hours:  Vitals:   12/25/18 0752 12/25/18 1004 12/25/18 1044 12/25/18 1153  BP:  98/61 99/61   Pulse:  90 86   Resp: 14 12 16 16   Temp:  98.4 F (36.9 C)    TempSrc:  Oral    SpO2: 96% 95%  93%  Weight:      Height:        Intake/Output from previous day:   Intake/Output Summary (Last 24 hours) at 12/25/2018 1201 Last data filed at 12/25/2018 0548 Gross per 24 hour  Intake 1318.83 ml  Output 1 ml  Net 1317.83 ml   Physical Exam: General: Alert, awake, oriented x3, in no acute distress. Thin built HEENT: [place]/AT [place]ERL, EOMI Neck: Trachea midline,  no masses, no thyromegal,y no JVD, no carotid bruit OROPHARYNX:  Moist, No exudate/ erythema/lesions.  Heart: Regular rate and rhythm, without murmurs, rubs, gallops, PMI non-displaced, no heaves or thrills on palpation.  Lungs: Clear to auscultation, no wheezing or rhonchi noted. No increased vocal fremitus resonant to percussion  Abdomen: Soft, nontender, nondistended, positive bowel sounds, no masses no hepatosplenomegaly noted..  Neuro: No focal neurological deficits noted cranial nerves II through XII grossly intact. DTRs 2+ bilaterally upper and lower extremities. Strength  5 out of 5 in bilateral upper and lower extremities. Musculoskeletal: No warm swelling or erythema around joints, no spinal tenderness noted. Psychiatric: Patient alert and oriented x3, good insight and cognition, good recent to remote recall. Lymph node survey: No cervical axillary or inguinal lymphadenopathy noted.  Lab Results:  Basic Metabolic Panel:    Component Value Date/Time   NA 137 12/22/2018 1749   NA 137 11/29/2018 1224   K 4.7 12/22/2018 1749   CL 108 12/22/2018 1749   CO2 20 (L) 12/22/2018 1749   BUN 8 12/22/2018 1749   BUN 5 (L) 11/29/2018 1224   CREATININE 0.55 (L) 12/22/2018 1749   GLUCOSE 87 12/22/2018 1749   CALCIUM 9.0 12/22/2018 1749   CBC:    Component Value Date/Time   WBC 13.5 (H) 12/25/2018 0600   HGB 7.2 (L) 12/25/2018 0600   HGB 8.1 (L) 11/29/2018 1224   HCT 21.5 (L) 12/25/2018 0600   HCT 24.6 (L) 11/29/2018 1224   PLT 250 12/25/2018 0600   PLT 330 11/29/2018 1224   MCV 98.2 12/25/2018 0600   MCV 104 (H) 11/29/2018 1224   NEUTROABS 8.4 (H) 12/25/2018 0600   NEUTROABS 6.4 11/29/2018 1224   LYMPHSABS 2.6 12/25/2018 0600   LYMPHSABS 3.9 (H) 11/29/2018 1224   MONOABS 1.5 (H) 12/25/2018 0600   EOSABS 0.6 (H) 12/25/2018 0600   EOSABS 0.3 11/29/2018 1224   BASOSABS 0.1 12/25/2018 0600   BASOSABS 0.1 11/29/2018 1224  Recent Results (from the past 240 hour(s))  SARS CORONAVIRUS 2 (TAT 6-24 HRS) Nasopharyngeal Nasopharyngeal Swab     Status: None   Collection Time: 12/22/18  4:57 PM   Specimen: Nasopharyngeal Swab  Result Value Ref Range Status   SARS Coronavirus 2 NEGATIVE NEGATIVE Final    Comment: (NOTE) SARS-CoV-2 target nucleic acids are NOT DETECTED. The SARS-CoV-2 RNA is generally detectable in upper and lower respiratory specimens during the acute phase of infection. Negative results do not preclude SARS-CoV-2 infection, do not rule out co-infections with other pathogens, and should not be used as the sole basis for treatment or other  patient management decisions. Negative results must be combined with clinical observations, patient history, and epidemiological information. The expected result is Negative. Fact Sheet for Patients: HairSlick.nohttps://www.fda.gov/media/138098/download Fact Sheet for Healthcare Providers: quierodirigir.comhttps://www.fda.gov/media/138095/download This test is not yet approved or cleared by the [place] FDA and  has been authorized for detection and/or diagnosis of SARS-CoV-2 by FDA under an Emergency Use Authorization (EUA). This EUA will remain  in effect (meaning this test can be used) for the duration of the COVID-19 declaration under Section 56 4(b)(1) of the Act, 21 U.S.C. section 360bbb-3(b)(1), unless the authorization is terminated or revoked sooner. Performed at [facility], [address] [place], [place] [address]     Studies/Results: No results found.  Medications: Scheduled Meds: . enoxaparin (LOVENOX) injection  40 mg Subcutaneous Q24H  . feeding supplement (ENSURE ENLIVE)  237 mL Oral BID BM  . folic acid  1 mg Oral Daily  . gabapentin  600 mg Oral TID  . HYDROmorphone   Intravenous Q4H  . hydroxyurea  1,000 mg Oral Daily  . ketorolac  15 mg Intravenous Q6H  . multivitamin with minerals  1 tablet Oral Daily  . senna-docusate  1 tablet Oral BID  . sodium chloride flush  10-40 mL Intracatheter Q12H   Continuous Infusions: . sodium chloride 10 mL/hr at 12/24/18 1420   PRN Meds:.diphenhydrAMINE, naloxone **AND** sodium chloride flush, ondansetron (ZOFRAN) IV, oxycodone, polyethylene glycol, sodium chloride flush  Consultants:  None  Procedures:  None  Antibiotics:  None  Assessment/Plan: Principal Problem:   Sickle cell pain crisis (HCC) Active Problems:   Tobacco abuse   Leukocytosis   Chronic pain  1. Hb Sickle Cell Disease with pain crisis: Continue IVF D5 .45% Saline @ KVO, wean off weight based Dilaudid PCA, continue IV Toradol 15 mg Q 6 H, continue  oxycodone 20 mg every 6 hours for breakthrough pain, monitor vitals very closely, Re-evaluate pain scale regularly, 2 L of Oxygen by [place]. 2. Leukocytosis: White cell count slightly elevated, no fever, no evidence of infection or inflammation.  We will continue to monitor.  3. Sickle Cell Anemia: Hemoglobin is now back to baseline status post transfusion of 1 unit of packed red blood cell. Patient is clinically stable.  4. Chronic pain Syndrome: Continue home pain medications. Patient counseled extensively about pain and nonpharmacologic modalities of pain control. 5. Tobacco use disorder: [name] was counseled on the dangers of tobacco use, and was advised to quit. Reviewed strategies to maximize success, including removing cigarettes and smoking materials from environment, stress management and support of family/friends. 6. History of polysubstance abuse: Patient still did not give his urine for drug screening, encouraged to do so today. Patient verbalized understanding. 7. Status post right arm decompressive fasciotomy: Dressing is clean and dry, no evidence of infection.  [facility]  Code Status: Full Code Family Communication: N/A Disposition Plan: For possible discharge home tomorrow morning.  Kylena Mole  If 7PM-7AM, please contact night-coverage.  12/25/2018, 12:01 PM  LOS: 3 days

## 2018-12-26 ENCOUNTER — Telehealth: Payer: Self-pay

## 2018-12-26 MED ORDER — HYDROMORPHONE 1 MG/ML IV SOLN
INTRAVENOUS | Status: DC
  Administered 2018-12-26: 30 mg via INTRAVENOUS
  Administered 2018-12-26: 10.5 mg via INTRAVENOUS
  Administered 2018-12-27: 0 mg via INTRAVENOUS
  Administered 2018-12-27: 4 mg via INTRAVENOUS
  Administered 2018-12-27: 9.5 mg via INTRAVENOUS
  Administered 2018-12-27: 1.5 mg via INTRAVENOUS
  Filled 2018-12-26: qty 30

## 2018-12-26 MED ORDER — OXYCODONE HCL 5 MG PO TABS
20.0000 mg | ORAL_TABLET | ORAL | Status: DC
  Administered 2018-12-26 – 2018-12-27 (×5): 20 mg via ORAL
  Filled 2018-12-26 (×5): qty 4

## 2018-12-26 MED ORDER — ACETAMINOPHEN 325 MG PO TABS
650.0000 mg | ORAL_TABLET | Freq: Four times a day (QID) | ORAL | Status: DC | PRN
  Administered 2018-12-26 (×2): 650 mg via ORAL
  Filled 2018-12-26 (×2): qty 2

## 2018-12-26 NOTE — Progress Notes (Signed)
Subjective: Matthew Shannon, a 28 year old male with a medical history significant for sickle cell disease, chronic pain syndrome, opiate dependence and tolerance, history of polysubstance abuse, and history of anemia of chronic disease was admitted for sickle cell pain crisis.  Patient continues to complain of uncontrolled pain primarily to right arm, low back, and lower extremities.  Pain intensity is 8/10 characterized as constant and throbbing.  Patient states that he cannot function at home at current pain intensity.  Also, he does not have opiate medications at home.  Patient currently denies headache, dizziness, paresthesias, chest pain, nausea, vomiting, or diarrhea.  Objective:  Vital signs in last 24 hours:  Vitals:   12/26/18 0006 12/26/18 0241 12/26/18 0403 12/26/18 0512  BP:  103/75  113/68  Pulse:  80  84  Resp: 15 20 13 20   Temp:  98.6 F (37 C)  (!) 97.4 F (36.3 C)  TempSrc:  Oral  Oral  SpO2: 93% 95% 93% 98%  Weight:    59.6 kg  Height:        Intake/Output from previous day:   Intake/Output Summary (Last 24 hours) at 12/26/2018 1216 Last data filed at 12/26/2018 1000 Gross per 24 hour  Intake 1687 ml  Output 304 ml  Net 1383 ml    Physical Exam: General: Alert, awake, oriented x3, in no acute distress.  HEENT: [place]/AT PEERL, EOMI Neck: Trachea midline,  no masses, no thyromegal,y no JVD, no carotid bruit OROPHARYNX:  Moist, No exudate/ erythema/lesions.  Heart: Regular rate and rhythm, without murmurs, rubs, gallops, PMI non-displaced, no heaves or thrills on palpation.  Lungs: Clear to auscultation, no wheezing or rhonchi noted. No increased vocal fremitus resonant to percussion  Abdomen: Soft, nontender, nondistended, positive bowel sounds, no masses no hepatosplenomegaly noted..  Neuro: No focal neurological deficits noted cranial nerves II through XII grossly intact. DTRs 2+ bilaterally upper and lower extremities.  Musculoskeletal: No warm swelling or  erythema around joints, no spinal tenderness noted. Skin: Right arm dressing clean, dry, intact.  No drainage noted. Psychiatric: Patient alert and oriented x3, good insight and cognition, good recent to remote recall. Lymph node survey: No cervical axillary or inguinal lymphadenopathy noted.  Lab Results:  Basic Metabolic Panel:    Component Value Date/Time   NA 137 12/22/2018 1749   NA 137 11/29/2018 1224   K 4.7 12/22/2018 1749   CL 108 12/22/2018 1749   CO2 20 (L) 12/22/2018 1749   BUN 8 12/22/2018 1749   BUN 5 (L) 11/29/2018 1224   CREATININE 0.55 (L) 12/22/2018 1749   GLUCOSE 87 12/22/2018 1749   CALCIUM 9.0 12/22/2018 1749   CBC:    Component Value Date/Time   WBC 13.5 (H) 12/25/2018 0600   HGB 7.2 (L) 12/25/2018 0600   HGB 8.1 (L) 11/29/2018 1224   HCT 21.5 (L) 12/25/2018 0600   HCT 24.6 (L) 11/29/2018 1224   PLT 250 12/25/2018 0600   PLT 330 11/29/2018 1224   MCV 98.2 12/25/2018 0600   MCV 104 (H) 11/29/2018 1224   NEUTROABS 8.4 (H) 12/25/2018 0600   NEUTROABS 6.4 11/29/2018 1224   LYMPHSABS 2.6 12/25/2018 0600   LYMPHSABS 3.9 (H) 11/29/2018 1224   MONOABS 1.5 (H) 12/25/2018 0600   EOSABS 0.6 (H) 12/25/2018 0600   EOSABS 0.3 11/29/2018 1224   BASOSABS 0.1 12/25/2018 0600   BASOSABS 0.1 11/29/2018 1224    Recent Results (from the past 240 hour(s))  SARS CORONAVIRUS 2 (TAT 6-24 HRS) Nasopharyngeal Nasopharyngeal Swab  Status: None   Collection Time: 12/22/18  4:57 PM   Specimen: Nasopharyngeal Swab  Result Value Ref Range Status   SARS Coronavirus 2 NEGATIVE NEGATIVE Final    Comment: (NOTE) SARS-CoV-2 target nucleic acids are NOT DETECTED. The SARS-CoV-2 RNA is generally detectable in upper and lower respiratory specimens during the acute phase of infection. Negative results do not preclude SARS-CoV-2 infection, do not rule out co-infections with other pathogens, and should not be used as the sole basis for treatment or other patient management  decisions. Negative results must be combined with clinical observations, patient history, and epidemiological information. The expected result is Negative. Fact Sheet for Patients: SugarRoll.be Fact Sheet for Healthcare Providers: https://www.woods-mathews.com/ This test is not yet approved or cleared by the [place] FDA and  has been authorized for detection and/or diagnosis of SARS-CoV-2 by FDA under an Emergency Use Authorization (EUA). This EUA will remain  in effect (meaning this test can be used) for the duration of the COVID-19 declaration under Section 56 4(b)(1) of the Act, 21 U.S.C. section 360bbb-3(b)(1), unless the authorization is terminated or revoked sooner. Performed at [facility], [place] [address], [place], [place] [address]     Studies/Results: No results found.  Medications: Scheduled Meds: . Chlorhexidine Gluconate Cloth  6 each Topical Q0600  . enoxaparin (LOVENOX) injection  40 mg Subcutaneous Q24H  . feeding supplement (ENSURE ENLIVE)  237 mL Oral BID BM  . folic acid  1 mg Oral Daily  . gabapentin  600 mg Oral TID  . HYDROmorphone   Intravenous Q4H  . hydroxyurea  1,000 mg Oral Daily  . ketorolac  15 mg Intravenous Q6H  . multivitamin with minerals  1 tablet Oral Daily  . oxycodone  20 mg Oral Q4H while awake  . senna-docusate  1 tablet Oral BID  . sodium chloride flush  10-40 mL Intracatheter Q12H   Continuous Infusions: . sodium chloride 10 mL/hr at 12/26/18 0018   PRN Meds:.acetaminophen, diphenhydrAMINE, naloxone **AND** sodium chloride flush, ondansetron (ZOFRAN) IV, polyethylene glycol, sodium chloride flush  Consultants:  None  Procedures:  None  Antibiotics:  None  Assessment/Plan: Principal Problem:   Sickle cell pain crisis ([place]) Active Problems:   Tobacco abuse   Leukocytosis   Chronic pain  Sickle cell disease with pain crisis: Continue IV fluids at [facility].  Weaning  weight-based Dilaudid PCA.  Settings changed to 0.5 mg, 10-minute lockout, and 2 mg/h. Continue IV Toradol every 6 hours.  Reevaluate pain scale regularly.  2 L supplemental oxygen as needed.  Monitor vital signs closely.  Leukocytosis: Mild leukocytosis.  Patient afebrile.  No evidence of infection or inflammation.  Continue to monitor without antibiotics.  Sickle cell anemia: Hemoglobin is consistent with patient's baseline.  Patient is s/p 1 unit packed red blood cells.  No indication for additional blood transfusion at this time.  Tobacco use disorder: Patient counseled at length.  History of polysubstance abuse: UDS pending.  Patient has not provided urine sample as of yet.  Status post right arm decompressive fasciotomy: Dressing is clean and dry, there is no evidence of infection.  Patient has follow-up scheduled with [facility] health system.   Code Status: Full Code Family Communication: N/A Disposition Plan: Not yet ready for discharge   [place], MSN, FNP-C Patient [place] [address] [place], [place] [address] [phone]  If 5PM-7AM, please contact night-coverage.  12/26/2018, 12:16 PM  LOS: 4 days

## 2018-12-26 NOTE — Telephone Encounter (Signed)
Refill request for oxycodone 30mg . Patient is aware that it is not due until 12/31/2018, he states he wanted to go ahead and put the request in. Please advise.

## 2018-12-27 ENCOUNTER — Emergency Department (HOSPITAL_BASED_OUTPATIENT_CLINIC_OR_DEPARTMENT_OTHER)
Admission: EM | Admit: 2018-12-27 | Discharge: 2018-12-27 | Disposition: A | Discharge status: Home / Self Care | Payer: Medicaid Other | Attending: Emergency Medicine | Admitting: Emergency Medicine

## 2018-12-27 ENCOUNTER — Other Ambulatory Visit: Payer: Self-pay

## 2018-12-27 ENCOUNTER — Encounter (HOSPITAL_BASED_OUTPATIENT_CLINIC_OR_DEPARTMENT_OTHER): Payer: Self-pay | Admitting: Emergency Medicine

## 2018-12-27 DIAGNOSIS — Z881 Allergy status to other antibiotic agents status: Secondary | ICD-10-CM | POA: Insufficient documentation

## 2018-12-27 DIAGNOSIS — D57219 Sickle-cell/Hb-C disease with crisis, unspecified: Secondary | ICD-10-CM | POA: Diagnosis not present

## 2018-12-27 DIAGNOSIS — D638 Anemia in other chronic diseases classified elsewhere: Secondary | ICD-10-CM

## 2018-12-27 DIAGNOSIS — F17210 Nicotine dependence, cigarettes, uncomplicated: Secondary | ICD-10-CM | POA: Insufficient documentation

## 2018-12-27 DIAGNOSIS — D5700 Hb-SS disease with crisis, unspecified: Secondary | ICD-10-CM

## 2018-12-27 DIAGNOSIS — Z880 Allergy status to penicillin: Secondary | ICD-10-CM | POA: Insufficient documentation

## 2018-12-27 DIAGNOSIS — Z79899 Other long term (current) drug therapy: Secondary | ICD-10-CM | POA: Insufficient documentation

## 2018-12-27 LAB — CBC
HCT: 23.5 % — ABNORMAL LOW (ref 39.0–52.0)
Hemoglobin: 7.8 g/dL — ABNORMAL LOW (ref 13.0–17.0)
MCH: 33.3 pg (ref 26.0–34.0)
MCHC: 33.2 g/dL (ref 30.0–36.0)
MCV: 100.4 fL — ABNORMAL HIGH (ref 80.0–100.0)
Platelets: 302 10*3/uL (ref 150–400)
RBC: 2.34 MIL/uL — ABNORMAL LOW (ref 4.22–5.81)
RDW: 18.5 % — ABNORMAL HIGH (ref 11.5–15.5)
WBC: 11.8 10*3/uL — ABNORMAL HIGH (ref 4.0–10.5)
nRBC: 1 % — ABNORMAL HIGH (ref 0.0–0.2)

## 2018-12-27 LAB — BASIC METABOLIC PANEL
Anion gap: 5 (ref 5–15)
BUN: 9 mg/dL (ref 6–20)
CO2: 30 mmol/L (ref 22–32)
Calcium: 9.1 mg/dL (ref 8.9–10.3)
Chloride: 101 mmol/L (ref 98–111)
Creatinine, Ser: 0.56 mg/dL — ABNORMAL LOW (ref 0.61–1.24)
GFR calc Af Amer: >60 mL/min (ref >60)
GFR calc non Af Amer: >60 mL/min (ref >60)
Glucose, Bld: 106 mg/dL — ABNORMAL HIGH (ref 70–99)
Potassium: 4.5 mmol/L (ref 3.5–5.1)
Sodium: 136 mmol/L (ref 135–145)

## 2018-12-27 MED ORDER — OXYCODONE HCL 5 MG PO TABS
30.0000 mg | ORAL_TABLET | Freq: Once | ORAL | Status: AC
  Administered 2018-12-27: 30 mg via ORAL
  Filled 2018-12-27: qty 6

## 2018-12-27 MED ORDER — KETOROLAC TROMETHAMINE 60 MG/2ML IM SOLN
60.0000 mg | Freq: Once | INTRAMUSCULAR | Status: AC
  Administered 2018-12-27: 60 mg via INTRAMUSCULAR
  Filled 2018-12-27: qty 2

## 2018-12-27 MED ORDER — HYDROMORPHONE HCL 1 MG/ML IJ SOLN
1.0000 mg | Freq: Once | INTRAMUSCULAR | Status: AC
  Administered 2018-12-27: 1 mg via INTRAMUSCULAR
  Filled 2018-12-27: qty 1

## 2018-12-27 NOTE — Discharge Instructions (Addendum)
Clinic states that you can fill your medication on 21st, hopefully sooner.  You will have to discuss with your primary care doctors about pain management until then.

## 2018-12-27 NOTE — ED Triage Notes (Signed)
Arrived via EMS c/o back pain which he states is his normal sickle cell pain. He was enroute to [facility] when the pain started for his dr appt when the pain became too bad that he did not think he could drive.

## 2018-12-27 NOTE — ED Notes (Signed)
Paged Sickle Cell hospitalist

## 2018-12-27 NOTE — ED Notes (Signed)
Pt verbalized understanding of dc instructions.

## 2018-12-27 NOTE — Discharge Summary (Signed)
Physician Discharge Summary  [name] NGE:[id number] DOB: Apr 07, 1990 DOA: 12/22/2018  PCP: Tresa Garter, MD  Admit date: 12/22/2018  Discharge date: 12/27/2018  Discharge Diagnoses:  Principal Problem:   Sickle cell pain crisis ([place]) Active Problems:   Tobacco abuse   Leukocytosis   Chronic pain   Discharge Condition: Stable  Disposition:  Follow-up Information    Tresa Garter, MD Follow up in 1 week(s).   Specialty: Internal Medicine Why: Repeat CBC Contact information: [place] [address] [phone]          Pt is discharged home in good condition and is to follow up with Tresa Garter, MD in 2 weeks to have labs evaluated. Matthew Shannon is instructed to increase activity slowly and balance with rest for the next few days, and use prescribed medication to complete treatment of pain  Diet: Regular Wt Readings from Last 3 Encounters:  12/26/18 59.6 kg  12/15/18 63.5 kg  11/29/18 57.1 kg    History of present illness:  Matthew Shannon  is a 28 y.o. male with a medical history significant for sickle cell disease, chronic pain syndrome, opiate tolerance and dependence, history of polysubstance abuse, tobacco dependence, and history of anemia of chronic disease presented to sickle cell day infusion center complaining of low back and lower extremity pain that is consistent with pain crisis.  Patient states that pain intensity has been increasing over the past several days.  He attributes current pain crisis to changes in weather.  Pain persisted despite oxycodone 30 mg every 6 hours.  Patient also endorses right arm pain.  He is status post right upper extremity decompressive fasciotomy at [facility].  On 11/09/2018 patient had compartment syndrome of RUE after contrast extravasation, he developed compartment syndrome.  Patient has been followed closely by plastic surgeon at [facility].  His current pain intensity is 9/10 characterized as  constant and aching.  He denies fever, chills, sick contacts, recent travel, or exposure to COVID-19.  Also denies headache, shortness of breath, chest pains, urinary symptoms, nausea, vomiting, or diarrhea.  Sickle cell day infusion center course:  WBCs 13.0, hemoglobin 8.2 (consistent with patient's baseline), platelets 297. BP 117/47, P79, RR 16, and oxygen saturation 99% on room air. Patient is afebrile. COVID test negative. UDS pending. Pain persists despite IV dilaudid, IV toradol, and IV fluids. Patient admitted to Med-surg for management of pain crisis.   Hospital Course:   Sickle cell disease with pain crisis: Patient was admitted for sickle cell pain crisis and managed appropriately with IVF, IV Dilaudid via PCA and IV Toradol, as well as other adjunct therapies per sickle cell pain management protocols.  IV Dilaudid was weaned appropriately.  Patient was transitioned to oxycodone.  Patient says that he is out of home medications.  He takes oxycodone 30 mg every 6 hours and receives 90 pills every 15 days.  Patient is not scheduled to pick up medications until 12/31/2018.  He says that he was doubling up on medications for pain control.  However patient has been hospitalized for around 5 days.  Patient's most recent prescription was filled on 12/16/2018 and he was admitted on 12/22/2018.  Therefore, patient took 90 oxycodone in 6 days.  If that is the case, patient is highly opiate tolerant and warrants referral to pain management going forward.  Patient's current MME is 270/day.  It is recommended that patient's  not exceed 90 MME's per day.  Patient is requesting to  fill prescription early, discussed at length with his primary provider.  He will not be able to approve patient receiving medication early.  Patient advised to continue ibuprofen 800 every 8 hours and use Tylenol 1000 mg interchangeably every 6 hours for pain control. Patient may benefit from Suboxone due to his opiate tolerance.   Patient has a medical history of polysubstance abuse.  We were unable to obtain urine drug screen during the admission.  Recommend urine drug screen at patient's follow-up appointment with Dr. Hyman HopesJegede. Patient is status post right extremity decompressive fasciotomy.  [facility].  He states that he has an upcoming appointment for skin graft.  He also endorses pain to the right extremity.  Patient advised to notify surgeon and schedule first available follow-up.  There are no signs of infection at this time.  Patient is alert, oriented, and ambulating without assistance.  He is afebrile and maintaining oxygen saturation at 100% on RA. Patient was discharged home today in a hemodynamically stable condition.   Discharge Exam: Vitals:   12/27/18 0540 12/27/18 0824  BP: 104/69   Pulse: 64   Resp: 19 20  Temp: 98.2 F (36.8 C)   SpO2: 90% 98%   Vitals:   12/27/18 0222 12/27/18 0409 12/27/18 0540 12/27/18 0824  BP: 110/72  104/69   Pulse: 74  64   Resp: 18 16 19 20   Temp: 98.3 F (36.8 C)  98.2 F (36.8 C)   TempSrc: Oral  Oral   SpO2: 96% 100% 90% 98%  Weight:      Height:        General appearance : Awake, alert, not in any distress. Speech Clear.  HEENT: Atraumatic and Normocephalic, pupils equally reactive to light and accomodation Neck: Supple, no JVD. No cervical lymphadenopathy.  Chest: Good air entry bilaterally, no added sounds  CVS: S1 S2 regular, no murmurs.  Abdomen: Bowel sounds present, Non tender and not distended with no gaurding, rigidity or rebound. Extremities: B/L Lower Ext shows no edema, both legs are warm to touch Neurology: Awake alert, and oriented X 3, CN II-XII intact, Non focal Skin: No Rash.  Dressing clean, dry, and intact.  No drainage noted  Discharge Instructions  Discharge Instructions    Discharge patient   Complete by: As directed    Discharge disposition: 01-Home or Self Care   Discharge patient date: 12/27/2018      Allergies as of 12/27/2018      Reactions   Ceftriaxone Shortness Of Breath, Itching, Other (See Comments), Cough   Zosyn [piperacillin Sod-tazobactam So] Shortness Of Breath   SOB/rash/N/V Has patient had a PCN reaction causing immediate rash, facial/tongue/throat swelling, SOB or lightheadedness with hypotension: Y Has patient had a PCN reaction causing severe rash involving mucus membranes or skin necrosis: N Has patient had a PCN reaction that required hospitalization: Y Has patient had a PCN reaction occurring within the last 10 years: Y If all of the above answers are "NO", then may proceed with Cephalosporin use.   Azithromycin Itching      Medication List    TAKE these medications   folic acid 1 MG tablet Commonly known as: FOLVITE Take 1 tablet (1 mg total) by mouth daily.   gabapentin 300 MG capsule Commonly known as: NEURONTIN Take 600 mg by mouth 3 (three) times daily.   hydroxyurea 500 MG capsule Commonly known as: HYDREA Take 2 capsules (1,000 mg total) by mouth daily. May take with food to  minimize GI side effects.   ondansetron 4 MG disintegrating tablet Commonly known as: Zofran ODT Take 1 tablet (4 mg total) by mouth every 8 (eight) hours as needed.   oxycodone 30 MG immediate release tablet Commonly known as: ROXICODONE Take 1 tablet (30 mg total) by mouth every 4 (four) hours as needed for up to 15 days for pain.   promethazine 12.5 MG tablet Commonly known as: PHENERGAN Take 1 tablet (12.5 mg total) by mouth every 8 (eight) hours as needed for nausea or vomiting.   sodium chloride irrigation 0.9 % irrigation Irrigate with 1 application as directed 2 (two) times daily.       The results of significant diagnostics from this hospitalization (including imaging, microbiology, ancillary and laboratory) are listed below for reference.    Significant Diagnostic Studies: No results found.  Microbiology: Recent Results (from the past 240 hour(s))   SARS CORONAVIRUS 2 (TAT 6-24 HRS) Nasopharyngeal Nasopharyngeal Swab     Status: None   Collection Time: 12/22/18  4:57 PM   Specimen: Nasopharyngeal Swab  Result Value Ref Range Status   SARS Coronavirus 2 NEGATIVE NEGATIVE Final    Comment: (NOTE) SARS-CoV-2 target nucleic acids are NOT DETECTED. The SARS-CoV-2 RNA is generally detectable in upper and lower respiratory specimens during the acute phase of infection. Negative results do not preclude SARS-CoV-2 infection, do not rule out co-infections with other pathogens, and should not be used as the sole basis for treatment or other patient management decisions. Negative results must be combined with clinical observations, patient history, and epidemiological information. The expected result is Negative. Fact Sheet for Patients: HairSlick.no Fact Sheet for Healthcare Providers: quierodirigir.com This test is not yet approved or cleared by the [place] FDA and  has been authorized for detection and/or diagnosis of SARS-CoV-2 by FDA under an Emergency Use Authorization (EUA). This EUA will remain  in effect (meaning this test can be used) for the duration of the COVID-19 declaration under Section 56 4(b)(1) of the Act, 21 U.S.C. section 360bbb-3(b)(1), unless the authorization is terminated or revoked sooner. Performed at [facility], [address] [place], [place] [address]      Labs: Basic Metabolic Panel: Recent Labs  Lab 12/22/18 1749 12/27/18 0600  NA 137 136  K 4.7 4.5  CL 108 101  CO2 20* 30  GLUCOSE 87 106*  BUN 8 9  CREATININE 0.55* 0.56*  CALCIUM 9.0 9.1   Liver Function Tests: Recent Labs  Lab 12/22/18 1749  AST 31  ALT 17  ALKPHOS 74  BILITOT 3.8*  PROT 8.0  ALBUMIN 4.2   No results for input(s): LIPASE, AMYLASE in the last 168 hours. No results for input(s): AMMONIA in the last 168 hours. CBC: Recent Labs  Lab 12/22/18 1125  12/24/18 0527 12/25/18 0600 12/27/18 0600  WBC 13.0* 13.6* 13.5* 11.8*  NEUTROABS 8.3*  --  8.4*  --   HGB 8.2* 6.2* 7.2* 7.8*  HCT 24.7* 18.3* 21.5* 23.5*  MCV 104.2* 100.5* 98.2 100.4*  PLT 297 223 250 302   Cardiac Enzymes: No results for input(s): CKTOTAL, CKMB, CKMBINDEX, TROPONINI in the last 168 hours. BNP: Invalid input(s): POCBNP CBG: No results for input(s): GLUCAP in the last 168 hours.  Time coordinating discharge: 50 minutes  Signed: [name]  APRN, MSN, FNP-C [facility] [address] [place], [place] [address] [phone]  [facility] 12/27/2018, 9:02 AM  This note was prepared  using Teaching laboratory technician, errors in dictation are unintentional.

## 2018-12-27 NOTE — Discharge Instructions (Signed)
Sickle Cell Anemia, Adult ° °Sickle cell anemia is a condition where your red blood cells are shaped like sickles. Red blood cells carry oxygen through the body. Sickle-shaped cells do not live as long as normal red blood cells. They also clump together and block blood from flowing through the blood vessels. This prevents the body from getting enough oxygen. Sickle cell anemia causes organ damage and pain. It also increases the risk of infection. °Follow these instructions at home: °Medicines °· Take over-the-counter and prescription medicines only as told by your doctor. °· If you were prescribed an antibiotic medicine, take it as told by your doctor. Do not stop taking the antibiotic even if you start to feel better. °· If you develop a fever, do not take medicines to lower the fever right away. Tell your doctor about the fever. °Managing pain, stiffness, and swelling °· Try these methods to help with pain: °? Use a heating pad. °? Take a warm bath. °? Distract yourself, such as by watching TV. °Eating and drinking °· Drink enough fluid to keep your pee (urine) clear or pale yellow. Drink more in hot weather and during exercise. °· Limit or avoid alcohol. °· Eat a healthy diet. Eat plenty of fruits, vegetables, whole grains, and lean protein. °· Take vitamins and supplements as told by your doctor. °Traveling °· When traveling, keep these with you: °? Your medical information. °? The names of your doctors. °? Your medicines. °· If you need to take an airplane, talk to your doctor first. °Activity °· Rest often. °· Avoid exercises that make your heart beat much faster, such as jogging. °General instructions °· Do not use products that have nicotine or tobacco, such as cigarettes and e-cigarettes. If you need help quitting, ask your doctor. °· Consider wearing a medical alert bracelet. °· Avoid being in high places (high altitudes), such as mountains. °· Avoid very hot or cold temperatures. °· Avoid places where the  temperature changes a lot. °· Keep all follow-up visits as told by your doctor. This is important. °Contact a doctor if: °· A joint hurts. °· Your feet or hands hurt or swell. °· You feel tired (fatigued). °Get help right away if: °· You have symptoms of infection. These include: °? Fever. °? Chills. °? Being very tired. °? Irritability. °? Poor eating. °? Throwing up (vomiting). °· You feel dizzy or faint. °· You have new stomach pain, especially on the left side. °· You have a an erection (priapism) that lasts more than 4 hours. °· You have numbness in your arms or legs. °· You have a hard time moving your arms or legs. °· You have trouble talking. °· You have pain that does not go away when you take medicine. °· You are short of breath. °· You are breathing fast. °· You have a long-term cough. °· You have pain in your chest. °· You have a bad headache. °· You have a stiff neck. °· Your stomach looks bloated even though you did not eat much. °· Your skin is pale. °· You suddenly cannot see well. °Summary °· Sickle cell anemia is a condition where your red blood cells are shaped like sickles. °· Follow your doctor's advice on ways to manage pain, food to eat, activities to do, and steps to take for safe travel. °· Get medical help right away if you have any signs of infection, such as a fever. °This information is not intended to replace advice given to you by   your health care provider. Make sure you discuss any questions you have with your health care provider. °Document Released: 11/16/2012 Document Revised: 05/20/2018 Document Reviewed: 03/03/2016 °Elsevier Patient Education © 2020 [employer] ° °
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
## 2018-12-28 ENCOUNTER — Emergency Department (HOSPITAL_COMMUNITY)
Admission: EM | Admit: 2018-12-28 | Discharge: 2018-12-28 | Disposition: A | Discharge status: Home / Self Care | Payer: Medicaid Other | Attending: Emergency Medicine | Admitting: Emergency Medicine

## 2018-12-28 ENCOUNTER — Other Ambulatory Visit: Payer: Self-pay

## 2018-12-28 ENCOUNTER — Encounter (HOSPITAL_COMMUNITY): Payer: Self-pay

## 2018-12-28 ENCOUNTER — Telehealth (HOSPITAL_COMMUNITY): Payer: Self-pay

## 2018-12-28 DIAGNOSIS — F1410 Cocaine abuse, uncomplicated: Secondary | ICD-10-CM | POA: Insufficient documentation

## 2018-12-28 DIAGNOSIS — F17210 Nicotine dependence, cigarettes, uncomplicated: Secondary | ICD-10-CM | POA: Insufficient documentation

## 2018-12-28 DIAGNOSIS — D5700 Hb-SS disease with crisis, unspecified: Secondary | ICD-10-CM | POA: Diagnosis not present

## 2018-12-28 DIAGNOSIS — Z79899 Other long term (current) drug therapy: Secondary | ICD-10-CM | POA: Insufficient documentation

## 2018-12-28 MED ORDER — HYDROMORPHONE HCL 1 MG/ML IJ SOLN
1.0000 mg | Freq: Once | INTRAMUSCULAR | Status: AC
  Administered 2018-12-28: 1 mg via INTRAMUSCULAR
  Filled 2018-12-28: qty 1

## 2018-12-28 MED ORDER — ONDANSETRON 8 MG PO TBDP
8.0000 mg | ORAL_TABLET | Freq: Once | ORAL | Status: AC
  Administered 2018-12-28: 13:00:00 8 mg via ORAL
  Filled 2018-12-28: qty 1

## 2018-12-28 MED ORDER — OXYCODONE HCL 5 MG PO TABS
30.0000 mg | ORAL_TABLET | Freq: Once | ORAL | Status: AC
  Administered 2018-12-28: 30 mg via ORAL
  Filled 2018-12-28: qty 6

## 2018-12-28 NOTE — ED Triage Notes (Signed)
C/o sickle cell pain that started 2 days ago.  C/o pain "all over".   8/10 generalized pain.   Patient reports taking tylenol and ibuprofen without relief.   A/ox4 Ambulatory in triage.

## 2018-12-28 NOTE — ED Notes (Addendum)
Pt left before D/C papers were completed by provider, and to reassess vitals for D/C.

## 2018-12-28 NOTE — Telephone Encounter (Signed)
Patient called requesting to come to the day hospital for sickle cell pain. Patient reported back and leg pain rated 8/10. Reports last taking Ibuprofen and tylenol for pain this morning and last took oxycodone yesterday morning while still admitted to the hospital. Pt admits to not having any more home meds to help manage pain. Denies fever, chest pain, nausea, vomiting, diarrhea and abdominal pain. Admits to not having means of transportation at discharge without driving himself. Provider notified. Provider denied day hospital admission and instead recommended hydration and continuation of NSAID's at this time. Patient advised and expresses an understanding.

## 2018-12-28 NOTE — ED Notes (Addendum)
Pt not in room.

## 2018-12-28 NOTE — Discharge Instructions (Addendum)
Continue alternating Motrin and Tylenol for pain.  Follow-up with your doctor for further care as needed.
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
## 2018-12-29 ENCOUNTER — Other Ambulatory Visit: Payer: Self-pay | Admitting: Internal Medicine

## 2018-12-29 ENCOUNTER — Other Ambulatory Visit: Payer: Self-pay

## 2018-12-29 MED ORDER — PROMETHAZINE HCL 12.5 MG PO TABS: 12.5000 mg | ORAL_TABLET | Freq: Four times a day (QID) | ORAL | 0 refills | Status: DC | PRN

## 2018-12-30 ENCOUNTER — Other Ambulatory Visit: Payer: Self-pay | Admitting: Internal Medicine

## 2018-12-30 ENCOUNTER — Telehealth: Payer: Self-pay | Admitting: Internal Medicine

## 2018-12-30 DIAGNOSIS — D571 Sickle-cell disease without crisis: Secondary | ICD-10-CM

## 2018-12-30 DIAGNOSIS — G894 Chronic pain syndrome: Secondary | ICD-10-CM

## 2018-12-30 MED ORDER — OXYCODONE HCL 30 MG PO TABS: 30.0000 mg | ORAL_TABLET | ORAL | 0 refills | Status: DC | PRN

## 2018-12-30 NOTE — Telephone Encounter (Signed)
Refilled for tomorrow due date

## 2019-01-09 ENCOUNTER — Other Ambulatory Visit: Payer: Self-pay

## 2019-01-09 ENCOUNTER — Emergency Department (HOSPITAL_COMMUNITY)
Admission: EM | Admit: 2019-01-09 | Discharge: 2019-01-10 | Disposition: A | Discharge status: Home / Self Care | Payer: Medicaid Other | Source: Home / Self Care | Attending: Emergency Medicine | Admitting: Emergency Medicine

## 2019-01-09 ENCOUNTER — Encounter (HOSPITAL_COMMUNITY): Payer: Self-pay | Admitting: Emergency Medicine

## 2019-01-09 ENCOUNTER — Emergency Department (HOSPITAL_COMMUNITY)
Admission: EM | Admit: 2019-01-09 | Discharge: 2019-01-09 | Disposition: A | Discharge status: Home / Self Care | Payer: Medicaid Other | Attending: Emergency Medicine | Admitting: Emergency Medicine

## 2019-01-09 ENCOUNTER — Emergency Department (HOSPITAL_COMMUNITY): Payer: Medicaid Other

## 2019-01-09 DIAGNOSIS — Z79899 Other long term (current) drug therapy: Secondary | ICD-10-CM | POA: Insufficient documentation

## 2019-01-09 DIAGNOSIS — D5700 Hb-SS disease with crisis, unspecified: Secondary | ICD-10-CM

## 2019-01-09 DIAGNOSIS — M549 Dorsalgia, unspecified: Secondary | ICD-10-CM | POA: Diagnosis present

## 2019-01-09 DIAGNOSIS — F17210 Nicotine dependence, cigarettes, uncomplicated: Secondary | ICD-10-CM | POA: Insufficient documentation

## 2019-01-09 MED ORDER — SODIUM CHLORIDE 0.9 % IV BOLUS
1000.0000 mL | Freq: Once | INTRAVENOUS | Status: AC
  Administered 2019-01-09: 1000 mL via INTRAVENOUS

## 2019-01-09 MED ORDER — SODIUM CHLORIDE 0.9% FLUSH: 3.0000 mL | Freq: Once | INTRAVENOUS | Status: DC

## 2019-01-09 MED ORDER — SODIUM CHLORIDE 0.9 % IV SOLN
10.00 | INTRAVENOUS | Status: DC
Start: ? — End: 2019-01-09

## 2019-01-09 MED ORDER — DIPHENHYDRAMINE HCL 50 MG/ML IJ SOLN
50.0000 mg | Freq: Once | INTRAMUSCULAR | Status: AC
  Administered 2019-01-09: 23:00:00 50 mg via INTRAMUSCULAR
  Filled 2019-01-09: qty 1

## 2019-01-09 MED ORDER — KETOROLAC TROMETHAMINE 60 MG/2ML IM SOLN
60.0000 mg | Freq: Once | INTRAMUSCULAR | Status: DC
  Filled 2019-01-09: qty 2

## 2019-01-09 MED ORDER — GENERIC EXTERNAL MEDICATION
Status: DC
Start: ? — End: 2019-01-09

## 2019-01-09 MED ORDER — ONDANSETRON 4 MG PO TBDP
4.0000 mg | ORAL_TABLET | Freq: Once | ORAL | Status: AC
  Administered 2019-01-09: 4 mg via ORAL
  Filled 2019-01-09: qty 1

## 2019-01-09 MED ORDER — OXYCODONE HCL 5 MG PO TABS
30.0000 mg | ORAL_TABLET | Freq: Once | ORAL | Status: AC
  Administered 2019-01-09: 30 mg via ORAL
  Filled 2019-01-09: qty 6

## 2019-01-09 MED ORDER — HYDROMORPHONE HCL 2 MG/ML IJ SOLN
2.0000 mg | Freq: Once | INTRAMUSCULAR | Status: AC
  Administered 2019-01-09: 2 mg via INTRAMUSCULAR
  Filled 2019-01-09: qty 1
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
## 2019-01-09 NOTE — ED Triage Notes (Signed)
Per EMS- Patient was on his way to [facility] for c/o sickle cell pain of bilateral legs and upper, mid, and lower back. Patient pulled his vehicle over and called EMS- Patient states he lives in [place].
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
## 2019-01-09 NOTE — ED Notes (Signed)
Patient is in the room getting into a gown call bell in reach

## 2019-01-09 NOTE — ED Notes (Addendum)
There is documentation at 1353 that pt eloped from [facility].
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
## 2019-01-10 ENCOUNTER — Non-Acute Institutional Stay (HOSPITAL_COMMUNITY)
Admission: AD | Admit: 2019-01-10 | Discharge: 2019-01-10 | Disposition: A | Discharge status: Home / Self Care | Payer: Medicaid Other | Source: Ambulatory Visit | Attending: Internal Medicine | Admitting: Internal Medicine

## 2019-01-10 ENCOUNTER — Telehealth (HOSPITAL_COMMUNITY): Payer: Self-pay | Admitting: General Practice

## 2019-01-10 ENCOUNTER — Encounter (HOSPITAL_COMMUNITY): Payer: Self-pay | Admitting: General Practice

## 2019-01-10 DIAGNOSIS — D5700 Hb-SS disease with crisis, unspecified: Secondary | ICD-10-CM | POA: Diagnosis present

## 2019-01-10 DIAGNOSIS — R52 Pain, unspecified: Secondary | ICD-10-CM | POA: Diagnosis present

## 2019-01-10 DIAGNOSIS — F17210 Nicotine dependence, cigarettes, uncomplicated: Secondary | ICD-10-CM | POA: Diagnosis not present

## 2019-01-10 LAB — CBC WITH DIFFERENTIAL/PLATELET
Abs Immature Granulocytes: 0.28 10*3/uL — ABNORMAL HIGH (ref 0.00–0.07)
Basophils Absolute: 0.1 10*3/uL (ref 0.0–0.1)
Basophils Relative: 1 %
Eosinophils Absolute: 0.5 10*3/uL (ref 0.0–0.5)
Eosinophils Relative: 3 %
HCT: 21.6 % — ABNORMAL LOW (ref 39.0–52.0)
Hemoglobin: 7.1 g/dL — ABNORMAL LOW (ref 13.0–17.0)
Immature Granulocytes: 2 %
Lymphocytes Relative: 30 %
Lymphs Abs: 5.3 10*3/uL — ABNORMAL HIGH (ref 0.7–4.0)
MCH: 32.9 pg (ref 26.0–34.0)
MCHC: 32.9 g/dL (ref 30.0–36.0)
MCV: 100 fL (ref 80.0–100.0)
Monocytes Absolute: 1.7 10*3/uL — ABNORMAL HIGH (ref 0.1–1.0)
Monocytes Relative: 9 %
Neutro Abs: 10.1 10*3/uL — ABNORMAL HIGH (ref 1.7–7.7)
Neutrophils Relative %: 55 %
Platelets: 303 10*3/uL (ref 150–400)
RBC: 2.16 MIL/uL — ABNORMAL LOW (ref 4.22–5.81)
RDW: 16.7 % — ABNORMAL HIGH (ref 11.5–15.5)
WBC: 17.9 10*3/uL — ABNORMAL HIGH (ref 4.0–10.5)
nRBC: 0.3 % — ABNORMAL HIGH (ref 0.0–0.2)

## 2019-01-10 LAB — COMPREHENSIVE METABOLIC PANEL
ALT: 10 U/L (ref 0–44)
AST: 36 U/L (ref 15–41)
Albumin: 4.1 g/dL (ref 3.5–5.0)
Alkaline Phosphatase: 66 U/L (ref 38–126)
Anion gap: 8 (ref 5–15)
BUN: 6 mg/dL (ref 6–20)
CO2: 24 mmol/L (ref 22–32)
Calcium: 9 mg/dL (ref 8.9–10.3)
Chloride: 107 mmol/L (ref 98–111)
Creatinine, Ser: 0.55 mg/dL — ABNORMAL LOW (ref 0.61–1.24)
GFR calc Af Amer: >60 mL/min (ref >60)
GFR calc non Af Amer: >60 mL/min (ref >60)
Glucose, Bld: 100 mg/dL — ABNORMAL HIGH (ref 70–99)
Potassium: 3.9 mmol/L (ref 3.5–5.1)
Sodium: 139 mmol/L (ref 135–145)
Total Bilirubin: 4.6 mg/dL — ABNORMAL HIGH (ref 0.3–1.2)
Total Protein: 7.6 g/dL (ref 6.5–8.1)

## 2019-01-10 LAB — RAPID URINE DRUG SCREEN, HOSP PERFORMED
Amphetamines: NOT DETECTED
Barbiturates: NOT DETECTED
Benzodiazepines: NOT DETECTED
Cocaine: NOT DETECTED
Opiates: POSITIVE — AB
Tetrahydrocannabinol: NOT DETECTED

## 2019-01-10 LAB — RETICULOCYTES
Immature Retic Fract: 27.7 % — ABNORMAL HIGH (ref 2.3–15.9)
RBC.: 2.16 MIL/uL — ABNORMAL LOW (ref 4.22–5.81)
Retic Count, Absolute: 194.8 10*3/uL — ABNORMAL HIGH (ref 19.0–186.0)
Retic Ct Pct: 9 % — ABNORMAL HIGH (ref 0.4–3.1)

## 2019-01-10 MED ORDER — ONDANSETRON HCL 4 MG/2ML IJ SOLN: 4.0000 mg | Freq: Four times a day (QID) | INTRAMUSCULAR | Status: DC | PRN

## 2019-01-10 MED ORDER — KETOROLAC TROMETHAMINE 30 MG/ML IJ SOLN
15.0000 mg | Freq: Once | INTRAMUSCULAR | Status: AC
  Administered 2019-01-10: 15 mg via INTRAVENOUS
  Filled 2019-01-10: qty 1

## 2019-01-10 MED ORDER — PROMETHAZINE HCL 25 MG/ML IJ SOLN
25.0000 mg | Freq: Once | INTRAMUSCULAR | Status: AC
  Administered 2019-01-10: 25 mg via INTRAVENOUS
  Filled 2019-01-10: qty 1

## 2019-01-10 MED ORDER — SODIUM CHLORIDE 0.45 % IV SOLN
INTRAVENOUS | Status: DC
  Administered 2019-01-10: 10:00:00 via INTRAVENOUS

## 2019-01-10 MED ORDER — KETOROLAC TROMETHAMINE 30 MG/ML IJ SOLN
30.0000 mg | Freq: Once | INTRAMUSCULAR | Status: AC
  Administered 2019-01-10: 30 mg via INTRAVENOUS
  Filled 2019-01-10: qty 1

## 2019-01-10 MED ORDER — OXYCODONE HCL 5 MG PO TABS
20.0000 mg | ORAL_TABLET | Freq: Once | ORAL | Status: AC
  Administered 2019-01-10: 20 mg via ORAL
  Filled 2019-01-10: qty 4

## 2019-01-10 MED ORDER — NALOXONE HCL 0.4 MG/ML IJ SOLN: 0.4000 mg | INTRAMUSCULAR | Status: DC | PRN

## 2019-01-10 MED ORDER — HYDROXYZINE HCL 10 MG PO TABS
10.0000 mg | ORAL_TABLET | Freq: Once | ORAL | Status: DC
  Filled 2019-01-10: qty 1

## 2019-01-10 MED ORDER — DIPHENHYDRAMINE HCL 25 MG PO CAPS: 25.0000 mg | ORAL_CAPSULE | ORAL | Status: DC | PRN

## 2019-01-10 MED ORDER — PROMETHAZINE HCL 25 MG PO TABS
12.5000 mg | ORAL_TABLET | Freq: Once | ORAL | Status: AC
  Administered 2019-01-10: 12.5 mg via ORAL
  Filled 2019-01-10: qty 1

## 2019-01-10 MED ORDER — DIPHENHYDRAMINE HCL 50 MG/ML IJ SOLN
25.0000 mg | Freq: Once | INTRAMUSCULAR | Status: AC
  Administered 2019-01-10: 25 mg via INTRAVENOUS
  Filled 2019-01-10: qty 1

## 2019-01-10 MED ORDER — HYDROMORPHONE 1 MG/ML IV SOLN
INTRAVENOUS | Status: DC
  Administered 2019-01-10: 13 mg via INTRAVENOUS
  Administered 2019-01-10: 30 mg via INTRAVENOUS
  Filled 2019-01-10: qty 30

## 2019-01-10 MED ORDER — SODIUM CHLORIDE 0.9% FLUSH: 9.0000 mL | INTRAVENOUS | Status: DC | PRN

## 2019-01-10 MED ORDER — HYDROMORPHONE HCL 2 MG/ML IJ SOLN
2.0000 mg | Freq: Once | INTRAMUSCULAR | Status: AC
  Administered 2019-01-10: 2 mg via INTRAVENOUS
  Filled 2019-01-10: qty 1
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
## 2019-01-10 NOTE — Progress Notes (Signed)
Patient admitted to the day hospital for treatment of sickle cell pain crisis. Patient reported pain rated 8/10 in the back and legs. Patient placed on Dilaudid PCA, given IV Toradol, PO Oxycodone and hydrated with IV fluids. At discharge patient reported  pain at 6/10. Discharge instructions given to patient. Patient alert, oriented and ambulatory at discharge.

## 2019-01-10 NOTE — ED Provider Notes (Signed)
Patient signed out to me to follow-up after treatment for sickle cell crisis.  Patient has been sleeping comfortably for a number of hours.  I went in and rechecked him and he is still having some pain.  Patient was informed that his labs look appropriate for him as did his vitals.  He does not require hospitalization at this time.  1 additional dose of pain medication, discharge.   Orpah Greek, MD 01/10/19 [phone]

## 2019-01-10 NOTE — Telephone Encounter (Signed)
Patient came to the [facility] waiting room,  complained of leg and back pain rated at 8/10. Denied chest pain, fever, diarrhea, abdominal pain, nausea. Screened negative for Covid-19 symptoms. Endorsed vomiting but not actively vomiting. Hopes to have transportation arranged by Claudie Revering . Last took 30 mg of Oxycodone at noon on 11/31/2020. Per provider, patient can be admitted to the day hospital for treatment. Patient notified, verbalized understanding.

## 2019-01-10 NOTE — Discharge Summary (Signed)
[facility] Discharge Summary   Patient ID: Matthew Shannon MRN: [id number] DOB/AGE: 07-04-1990 28 y.o.  Admit date: 01/10/2019 Discharge date: 01/10/2019  Primary Care Physician:  Tresa Garter, MD  Admission Diagnoses:  Active Problems:   Sickle cell pain crisis [facility])   Discharge Medications:  Allergies as of 01/10/2019      Reactions   Ceftriaxone Shortness Of Breath, Itching, Other (See Comments), Cough   Zosyn [piperacillin Sod-tazobactam So] Shortness Of Breath   SOB/rash/N/V Has patient had a PCN reaction causing immediate rash, facial/tongue/throat swelling, SOB or lightheadedness with hypotension: Y Has patient had a PCN reaction causing severe rash involving mucus membranes or skin necrosis: N Has patient had a PCN reaction that required hospitalization: Y Has patient had a PCN reaction occurring within the last 10 years: Y If all of the above answers are "NO", then may proceed with Cephalosporin use.   Azithromycin Itching      Medication List    ASK your doctor about these medications   folic acid 1 MG tablet Commonly known as: FOLVITE Take 1 tablet (1 mg total) by mouth daily.   gabapentin 300 MG capsule Commonly known as: NEURONTIN Take 600 mg by mouth 3 (three) times daily.   hydroxyurea 500 MG capsule Commonly known as: HYDREA Take 2 capsules (1,000 mg total) by mouth daily. May take with food to minimize GI side effects.   ondansetron 4 MG disintegrating tablet Commonly known as: Zofran ODT Take 1 tablet (4 mg total) by mouth every 8 (eight) hours as needed.   oxycodone 30 MG immediate release tablet Commonly known as: ROXICODONE Take 1 tablet (30 mg total) by mouth every 4 (four) hours as needed for up to 15 days for pain.   promethazine 12.5 MG tablet Commonly known as: PHENERGAN Take 1 tablet (12.5 mg total) by mouth every 6 (six) hours as needed for nausea or vomiting.   sodium chloride irrigation 0.9 % irrigation Irrigate  with 1 application as directed 2 (two) times daily.        Consults:  None  Significant Diagnostic Studies:  Dg Chest 2 View  Result Date: 01/09/2019 CLINICAL DATA:  Chest pain EXAM: CHEST - 2 VIEW COMPARISON:  September 04, 2018 FINDINGS: Scarring at the left greater than right lung bases. No new consolidation or edema. No pleural effusion or pneumothorax. Stable cardiomediastinal contours. Dextrocurvature of the thoracic spine with endplate changes reflecting sickle cell disease. Cholecystectomy. IMPRESSION: No active cardiopulmonary disease. Electronically Signed   By: Macy Mis M.D.   On: 01/09/2019 09:45   History of present illness:  Matthew Shannon, a 28 year old male with a medical history significant for sickle cell disease, opiate dependence and tolerance, chronic pain syndrome, drug-seeking behavior, emergency room overutilization, and anemia of chronic disease presents complaining generalized pain that is consistent with his typical crisis.  Patient was last treated and evaluated in the emergency room this a.m. without sustained relief.  He says that pain was not resolved upon discharge from the ER.  Pain intensity 10/10 characterized as constant and throbbing.  He states that prior to reporting to the emergency department, he was taken medications consistently for pain without sustained relief.  He attributes pain crisis to changes in weather.  Patient denies recent travel, sick contacts, or exposure to COVID-19.  He also denies fever, chills, chest pain, shortness of breath, sore throat, persistent cough, urinary symptoms, nausea, vomiting, or diarrhea.  [facility] Course: Patient admitted to sickle  cell day infusion center for management of pain crisis. All laboratory values from the emergency department were reviewed.  Did not warrant repeating on admission to sickle cell day infusion center.  WBCs were 17.9, patient afebrile, no signs of infection or inflammation.   Suspected to be reactive.  No antibiotics warranted at this time. Hemoglobin 7.1, consistent with patient's baseline of 7.0-8.0 g/dL.  Patient did not warrant blood transfusion during this admission.  Advised to follow-up with PCP in 1 week to repeat CBC.  Pain managed with IV Dilaudid via PCA with settings of 0.5 mg, 10-minute lockout, and 3 mg/h. IV Toradol 15 mg IV x1 Tylenol 1000 mg by mouth times IV fluids, 0.45% saline at 50 mL/h Patient states the pain intensity is 6-7/10.  He does not warrant admission at this time.  Patient advised to follow-up with PCP in 1 week. Patient is alert, oriented, and ambulating without assistance.  He will discharge home in a hemodynamically stable condition.  Discharge instructions: Resume all home medications.  Follow up with PCP as previously  scheduled.   Discussed the importance of drinking 64 ounces of water daily to  help prevent pain crises, it is important to drink plenty of water throughout the day. This is because dehydration of red blood cells may lead further sickling.   Avoid all stressors that precipitate sickle cell pain crisis.     The patient was given clear instructions to go to ER or return to medical center if symptoms do not improve, worsen or new problems develop.    Physical Exam at Discharge:  BP 102/63 (BP Location: Left Arm)   Pulse 83   Temp 98.8 F (37.1 C) (Oral)   Resp 13   SpO2 94%  Physical Exam Constitutional:      Appearance: Normal appearance.  Eyes:     General: Scleral icterus present.     Pupils: Pupils are equal, round, and reactive to light.  Cardiovascular:     Rate and Rhythm: Normal rate and regular rhythm.     Heart sounds: Murmur present.  Pulmonary:     Effort: Pulmonary effort is normal.     Breath sounds: Normal breath sounds.  Abdominal:     General: Abdomen is flat. Bowel sounds are normal.  Neurological:     General: No focal deficit present.     Mental Status: He is alert. Mental  status is at baseline.  Psychiatric:        Mood and Affect: Mood normal.        Behavior: Behavior normal.        Thought Content: Thought content normal.        Judgment: Judgment normal.     Disposition at Discharge: Discharge disposition: 01-Home or Self Care       Discharge Orders:   Condition at Discharge:   Stable  Time spent on Discharge:  Greater than 30 minutes.  Signed: [name]  APRN, MSN, FNP-C Patient Care [facility] [address] [place], [place] [address] [phone]  01/10/2019, 10:45 PM
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
## 2019-01-10 NOTE — BH Specialist Note (Signed)
[facility]ated [facility]al [facility] Referral Note  Reason for Referral: Matthew Shannon is a 28 y.o. male  Pt was referred by MD, Doreene Burke for: social needs, frequent encounters  Pt reports the following concerns: cold weather causing increased pain, looking for permanent housing  Assessment: Patient referred by MD. Assessed possible stressors and influences on patient's frequent ED encounters. Patient cited changing weather as a factor in increased pain. Denied other factors such as lack of access to meds or other stressors.   Patient lives with his father and has two children. He'd like to move himself and his sons to their own permanent housing. Has SSD income. Would like to continue living in [place].  Plan: 1. Addressed today: Patient may be eligible for Section 8 voucher. Called [employer] and they indicated their Section 8 waiting list is open for applications. Patient consented to have application mailed to him. Advised patient to call CSW for assistance with the application as needed once he receives it in the mail. Also referred patient to [employer] affordable housing search database, as Section 8 application process can be lengthy. CSW will continue to follow for support.  2. Referral: [employer], [employer]  3. Follow up: as needed  Estanislado Emms, [facility] [phone]

## 2019-01-10 NOTE — Discharge Instructions (Signed)
Sickle Cell Anemia, Adult ° °Sickle cell anemia is a condition where your red blood cells are shaped like sickles. Red blood cells carry oxygen through the body. Sickle-shaped cells do not live as long as normal red blood cells. They also clump together and block blood from flowing through the blood vessels. This prevents the body from getting enough oxygen. Sickle cell anemia causes organ damage and pain. It also increases the risk of infection. °Follow these instructions at home: °Medicines °· Take over-the-counter and prescription medicines only as told by your doctor. °· If you were prescribed an antibiotic medicine, take it as told by your doctor. Do not stop taking the antibiotic even if you start to feel better. °· If you develop a fever, do not take medicines to lower the fever right away. Tell your doctor about the fever. °Managing pain, stiffness, and swelling °· Try these methods to help with pain: °? Use a heating pad. °? Take a warm bath. °? Distract yourself, such as by watching TV. °Eating and drinking °· Drink enough fluid to keep your pee (urine) clear or pale yellow. Drink more in hot weather and during exercise. °· Limit or avoid alcohol. °· Eat a healthy diet. Eat plenty of fruits, vegetables, whole grains, and lean protein. °· Take vitamins and supplements as told by your doctor. °Traveling °· When traveling, keep these with you: °? Your medical information. °? The names of your doctors. °? Your medicines. °· If you need to take an airplane, talk to your doctor first. °Activity °· Rest often. °· Avoid exercises that make your heart beat much faster, such as jogging. °General instructions °· Do not use products that have nicotine or tobacco, such as cigarettes and e-cigarettes. If you need help quitting, ask your doctor. °· Consider wearing a medical alert bracelet. °· Avoid being in high places (high altitudes), such as mountains. °· Avoid very hot or cold temperatures. °· Avoid places where the  temperature changes a lot. °· Keep all follow-up visits as told by your doctor. This is important. °Contact a doctor if: °· A joint hurts. °· Your feet or hands hurt or swell. °· You feel tired (fatigued). °Get help right away if: °· You have symptoms of infection. These include: °? Fever. °? Chills. °? Being very tired. °? Irritability. °? Poor eating. °? Throwing up (vomiting). °· You feel dizzy or faint. °· You have new stomach pain, especially on the left side. °· You have a an erection (priapism) that lasts more than 4 hours. °· You have numbness in your arms or legs. °· You have a hard time moving your arms or legs. °· You have trouble talking. °· You have pain that does not go away when you take medicine. °· You are short of breath. °· You are breathing fast. °· You have a long-term cough. °· You have pain in your chest. °· You have a bad headache. °· You have a stiff neck. °· Your stomach looks bloated even though you did not eat much. °· Your skin is pale. °· You suddenly cannot see well. °Summary °· Sickle cell anemia is a condition where your red blood cells are shaped like sickles. °· Follow your doctor's advice on ways to manage pain, food to eat, activities to do, and steps to take for safe travel. °· Get medical help right away if you have any signs of infection, such as a fever. °This information is not intended to replace advice given to you by   your health care provider. Make sure you discuss any questions you have with your health care provider. °Document Released: 11/16/2012 Document Revised: 05/20/2018 Document Reviewed: 03/03/2016 °Elsevier Patient Education © 2020 [employer] ° °

## 2019-01-12 ENCOUNTER — Telehealth: Payer: Self-pay | Admitting: Internal Medicine

## 2019-01-12 ENCOUNTER — Other Ambulatory Visit: Payer: Self-pay | Admitting: Family Medicine

## 2019-01-12 DIAGNOSIS — G894 Chronic pain syndrome: Secondary | ICD-10-CM

## 2019-01-12 DIAGNOSIS — D571 Sickle-cell disease without crisis: Secondary | ICD-10-CM

## 2019-01-12 MED ORDER — OXYCODONE HCL 30 MG PO TABS: 30.0000 mg | ORAL_TABLET | ORAL | 0 refills | Status: DC | PRN

## 2019-01-12 NOTE — Progress Notes (Signed)
Reviewed PDMP prior to prescribing opiate medications, no inconsistencies noted. Last UDS 01/10/2019. Medication is not to be filled prior to 01/15/2019, there are no exceptions. If patient is having sickle cell pain crisis, he can contact sickle cell day infusion center for triage or report to the closest ER.   Meds ordered this encounter  Medications  . oxycodone (ROXICODONE) 30 MG immediate release tablet    Sig: Take 1 tablet (30 mg total) by mouth every 4 (four) hours as needed for up to 15 days for pain.    Dispense:  90 tablet    Refill:  0    Rx is not to be filled prior to 01/15/2019. There are no exceptions.    Order Specific Question:   Supervising Provider    Answer:   Tresa Garter [[id number]]    Donia Pounds  APRN, MSN, FNP-C [facility] [address] [place], [place] [address] [phone]

## 2019-01-12 NOTE — Telephone Encounter (Signed)
done

## 2019-01-20 ENCOUNTER — Encounter (HOSPITAL_COMMUNITY): Payer: Self-pay | Admitting: Emergency Medicine

## 2019-01-20 ENCOUNTER — Emergency Department (HOSPITAL_COMMUNITY)
Admission: EM | Admit: 2019-01-20 | Discharge: 2019-01-21 | Disposition: A | Discharge status: Home / Self Care | Payer: Medicaid Other | Source: Home / Self Care

## 2019-01-20 ENCOUNTER — Emergency Department (HOSPITAL_COMMUNITY)
Admission: EM | Admit: 2019-01-20 | Discharge: 2019-01-20 | Discharge status: Against Medical Advice | Payer: Medicaid Other | Source: Home / Self Care | Attending: Emergency Medicine | Admitting: Emergency Medicine

## 2019-01-20 ENCOUNTER — Other Ambulatory Visit: Payer: Self-pay

## 2019-01-20 ENCOUNTER — Emergency Department (HOSPITAL_COMMUNITY): Payer: Medicaid Other

## 2019-01-20 DIAGNOSIS — Z5321 Procedure and treatment not carried out due to patient leaving prior to being seen by health care provider: Secondary | ICD-10-CM | POA: Insufficient documentation

## 2019-01-20 DIAGNOSIS — Z79899 Other long term (current) drug therapy: Secondary | ICD-10-CM | POA: Insufficient documentation

## 2019-01-20 DIAGNOSIS — D5700 Hb-SS disease with crisis, unspecified: Secondary | ICD-10-CM | POA: Insufficient documentation

## 2019-01-20 DIAGNOSIS — F17210 Nicotine dependence, cigarettes, uncomplicated: Secondary | ICD-10-CM | POA: Insufficient documentation

## 2019-01-20 LAB — URINALYSIS, ROUTINE W REFLEX MICROSCOPIC
Bilirubin Urine: NEGATIVE
Glucose, UA: NEGATIVE mg/dL
Hgb urine dipstick: NEGATIVE
Ketones, ur: NEGATIVE mg/dL
Leukocytes,Ua: NEGATIVE
Nitrite: NEGATIVE
Protein, ur: NEGATIVE mg/dL
Specific Gravity, Urine: 1.005 (ref 1.005–1.030)
pH: 7 (ref 5.0–8.0)

## 2019-01-20 MED ORDER — NITROGLYCERIN 0.4 MG SL SUBL
0.40 | SUBLINGUAL_TABLET | SUBLINGUAL | Status: DC
Start: ? — End: 2019-01-20

## 2019-01-20 MED ORDER — Medication
15.00 | Status: DC
Start: ? — End: 2019-01-20

## 2019-01-20 MED ORDER — DIPHENDRYL 25 MG PO TABS
1.00 | ORAL_TABLET | ORAL | Status: DC
Start: 2019-01-21 — End: 2019-01-20

## 2019-01-20 MED ORDER — SG DIBROMM 2-12.5 MG/5ML OR ELIX
4.00 | ORAL_SOLUTION | ORAL | Status: DC
Start: 2019-01-21 — End: 2019-01-20

## 2019-01-20 MED ORDER — HYDROMORPHONE HCL 2 MG/ML IJ SOLN
2.0000 mg | Freq: Once | INTRAMUSCULAR | Status: AC
  Administered 2019-01-20: 2 mg via SUBCUTANEOUS
  Filled 2019-01-20: qty 1

## 2019-01-20 MED ORDER — OATMEAL BATH OILATED EX PACK
200.00 | PACK | CUTANEOUS | Status: DC
Start: ? — End: 2019-01-20

## 2019-01-20 MED ORDER — VALULINE SHORT LEG WALKER SM MISC
1000.00 | Status: DC
Start: 2019-01-21 — End: 2019-01-20

## 2019-01-20 MED ORDER — Medication
Status: DC
Start: ? — End: 2019-01-20

## 2019-01-20 MED ORDER — METRISET BURETTE SET MISC
200.00 | Status: DC
Start: ? — End: 2019-01-20

## 2019-01-20 MED ORDER — HYDROMORPHONE HCL 1 MG/ML IJ SOLN
1.0000 mg | Freq: Once | INTRAMUSCULAR | Status: AC
  Administered 2019-01-20: 20:00:00 1 mg via SUBCUTANEOUS
  Filled 2019-01-20: qty 1

## 2019-01-20 MED ORDER — NAPROXEN 500 MG PO TABS
500.0000 mg | ORAL_TABLET | Freq: Once | ORAL | Status: AC
  Administered 2019-01-20: 500 mg via ORAL
  Filled 2019-01-20: qty 1

## 2019-01-20 MED ORDER — RU-HIST FORTE PO
15.00 | ORAL | Status: DC
Start: ? — End: 2019-01-20

## 2019-01-20 MED ORDER — PRO HERBS ENERGY PO TABS
20.00 | ORAL_TABLET | ORAL | Status: DC
Start: 2019-01-20 — End: 2019-01-20

## 2019-01-20 MED ORDER — BARO-CAT PO
10.00 | ORAL | Status: DC
Start: ? — End: 2019-01-20

## 2019-01-20 MED ORDER — TGT EYE ALLERGY RELIEF 0.027-0.315 % OP SOLN
25.00 | OPHTHALMIC | Status: DC
Start: 2019-01-20 — End: 2019-01-20

## 2019-01-20 MED ORDER — Medication
3.00 | Status: DC
Start: 2019-01-20 — End: 2019-01-20

## 2019-01-20 MED ORDER — BSS IO SOLN
600.00 | INTRAOCULAR | Status: DC
Start: 2019-01-20 — End: 2019-01-20

## 2019-01-20 MED ORDER — Medication
12.50 | Status: DC
Start: ? — End: 2019-01-20

## 2019-01-20 MED ORDER — ACCU-PRO PUMP SET/VENT MISC
2.50 | Status: DC
Start: ? — End: 2019-01-20

## 2019-01-20 MED ORDER — KEFLEX 125 MG/5ML PO SUSR
1.00 | ORAL | Status: DC
Start: 2019-01-21 — End: 2019-01-20

## 2019-01-20 MED ORDER — ONE-A-DAY WITHIN PO
30.00 | ORAL | Status: DC
Start: ? — End: 2019-01-20

## 2019-01-20 MED ORDER — SODIUM CHLORIDE 0.9 % IV SOLN
10.00 | INTRAVENOUS | Status: DC
Start: ? — End: 2019-01-20

## 2019-01-20 MED ORDER — SANTYL EX
100.00 | CUTANEOUS | Status: DC
Start: ? — End: 2019-01-20

## 2019-01-20 MED ORDER — CVS EAR DROPS OT
40.00 | OTIC | Status: DC
Start: 2019-01-21 — End: 2019-01-20

## 2019-01-20 MED ORDER — HYDROMORPHONE HCL 2 MG/ML IJ SOLN
2.0000 mg | Freq: Once | INTRAMUSCULAR | Status: AC
  Administered 2019-01-20: 23:00:00 2 mg via SUBCUTANEOUS
  Filled 2019-01-20: qty 1

## 2019-01-20 MED ORDER — ONDANSETRON HCL 4 MG/2ML IJ SOLN: 2.0000 mg | Freq: Once | INTRAMUSCULAR | Status: DC
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
## 2019-01-20 NOTE — ED Notes (Signed)
Patient ambulated  to restroom without assistance.
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
## 2019-01-20 NOTE — ED Notes (Signed)
Patient had stated he was going to the restroom, but when this nurse came back to check on patient, pt and his belongings were gone and this nurse was informed by another staff member that patient was seen leaving.

## 2019-01-20 NOTE — ED Triage Notes (Signed)
Pt arriving to the ED via [facility] with c/o sickle cell pain. VSS en route.

## 2019-01-20 NOTE — ED Triage Notes (Signed)
Pt says he is hurting all over and has been having some vomiting. Reporting just d/c from [facility] today for cellulitis. Of note, the pt was noticed to have eloped around 2300 from [facility] after receiving treatment.

## 2019-01-20 NOTE — ED Notes (Signed)
This nurse and a second nurse attempted IV and blood draw unsuccessfully, IV team consult put in.

## 2019-01-21 ENCOUNTER — Emergency Department (HOSPITAL_COMMUNITY): Payer: Medicaid Other

## 2019-01-21 ENCOUNTER — Encounter (HOSPITAL_COMMUNITY): Payer: Self-pay | Admitting: Emergency Medicine

## 2019-01-21 ENCOUNTER — Inpatient Hospital Stay (HOSPITAL_COMMUNITY)
Admission: EM | Admit: 2019-01-21 | Discharge: 2019-01-24 | DRG: 812 | Disposition: A | Discharge status: Home / Self Care | Payer: Medicaid Other | Attending: Internal Medicine | Admitting: Internal Medicine

## 2019-01-21 ENCOUNTER — Emergency Department (HOSPITAL_COMMUNITY)
Admission: EM | Admit: 2019-01-21 | Discharge: 2019-01-21 | Disposition: A | Discharge status: Home / Self Care | Payer: Medicaid Other | Source: Home / Self Care | Attending: Emergency Medicine | Admitting: Emergency Medicine

## 2019-01-21 ENCOUNTER — Other Ambulatory Visit: Payer: Self-pay

## 2019-01-21 ENCOUNTER — Telehealth: Payer: Self-pay | Admitting: Family Medicine

## 2019-01-21 ENCOUNTER — Other Ambulatory Visit: Payer: Self-pay | Admitting: Family Medicine

## 2019-01-21 DIAGNOSIS — Z833 Family history of diabetes mellitus: Secondary | ICD-10-CM | POA: Diagnosis not present

## 2019-01-21 DIAGNOSIS — F17209 Nicotine dependence, unspecified, with unspecified nicotine-induced disorders: Secondary | ICD-10-CM | POA: Diagnosis not present

## 2019-01-21 DIAGNOSIS — D5700 Hb-SS disease with crisis, unspecified: Secondary | ICD-10-CM | POA: Diagnosis present

## 2019-01-21 DIAGNOSIS — Z881 Allergy status to other antibiotic agents status: Secondary | ICD-10-CM | POA: Diagnosis not present

## 2019-01-21 DIAGNOSIS — R112 Nausea with vomiting, unspecified: Secondary | ICD-10-CM

## 2019-01-21 DIAGNOSIS — Z720 Tobacco use: Secondary | ICD-10-CM

## 2019-01-21 DIAGNOSIS — D72829 Elevated white blood cell count, unspecified: Secondary | ICD-10-CM | POA: Diagnosis not present

## 2019-01-21 DIAGNOSIS — J320 Chronic maxillary sinusitis: Secondary | ICD-10-CM | POA: Diagnosis present

## 2019-01-21 DIAGNOSIS — F17210 Nicotine dependence, cigarettes, uncomplicated: Secondary | ICD-10-CM | POA: Diagnosis present

## 2019-01-21 DIAGNOSIS — L03211 Cellulitis of face: Secondary | ICD-10-CM | POA: Diagnosis present

## 2019-01-21 DIAGNOSIS — Z825 Family history of asthma and other chronic lower respiratory diseases: Secondary | ICD-10-CM

## 2019-01-21 DIAGNOSIS — Z20828 Contact with and (suspected) exposure to other viral communicable diseases: Secondary | ICD-10-CM | POA: Diagnosis present

## 2019-01-21 DIAGNOSIS — F1910 Other psychoactive substance abuse, uncomplicated: Secondary | ICD-10-CM | POA: Diagnosis present

## 2019-01-21 DIAGNOSIS — Z79899 Other long term (current) drug therapy: Secondary | ICD-10-CM | POA: Insufficient documentation

## 2019-01-21 DIAGNOSIS — Z832 Family history of diseases of the blood and blood-forming organs and certain disorders involving the immune mechanism: Secondary | ICD-10-CM

## 2019-01-21 DIAGNOSIS — G894 Chronic pain syndrome: Secondary | ICD-10-CM | POA: Insufficient documentation

## 2019-01-21 DIAGNOSIS — F1120 Opioid dependence, uncomplicated: Secondary | ICD-10-CM | POA: Diagnosis present

## 2019-01-21 DIAGNOSIS — E876 Hypokalemia: Secondary | ICD-10-CM | POA: Diagnosis present

## 2019-01-21 LAB — CBC WITH DIFFERENTIAL/PLATELET
Abs Immature Granulocytes: 0.1 10*3/uL — ABNORMAL HIGH (ref 0.00–0.07)
Basophils Absolute: 0.1 10*3/uL (ref 0.0–0.1)
Basophils Relative: 0 %
Eosinophils Absolute: 0.5 10*3/uL (ref 0.0–0.5)
Eosinophils Relative: 3 %
HCT: 22.6 % — ABNORMAL LOW (ref 39.0–52.0)
Hemoglobin: 7.8 g/dL — ABNORMAL LOW (ref 13.0–17.0)
Immature Granulocytes: 1 %
Lymphocytes Relative: 36 %
Lymphs Abs: 6.5 10*3/uL — ABNORMAL HIGH (ref 0.7–4.0)
MCH: 33.3 pg (ref 26.0–34.0)
MCHC: 34.5 g/dL (ref 30.0–36.0)
MCV: 96.6 fL (ref 80.0–100.0)
Monocytes Absolute: 1.9 10*3/uL — ABNORMAL HIGH (ref 0.1–1.0)
Monocytes Relative: 10 %
Neutro Abs: 8.9 10*3/uL — ABNORMAL HIGH (ref 1.7–7.7)
Neutrophils Relative %: 50 %
Platelets: 262 10*3/uL (ref 150–400)
RBC: 2.34 MIL/uL — ABNORMAL LOW (ref 4.22–5.81)
RDW: 16.6 % — ABNORMAL HIGH (ref 11.5–15.5)
WBC: 17.8 10*3/uL — ABNORMAL HIGH (ref 4.0–10.5)
nRBC: 0.3 % — ABNORMAL HIGH (ref 0.0–0.2)

## 2019-01-21 LAB — SARS CORONAVIRUS 2 (TAT 6-24 HRS): SARS Coronavirus 2: NEGATIVE

## 2019-01-21 LAB — RETICULOCYTES
Immature Retic Fract: 22.9 % — ABNORMAL HIGH (ref 2.3–15.9)
RBC.: 2.34 MIL/uL — ABNORMAL LOW (ref 4.22–5.81)
Retic Count, Absolute: 175 10*3/uL (ref 19.0–186.0)
Retic Ct Pct: 7.5 % — ABNORMAL HIGH (ref 0.4–3.1)

## 2019-01-21 LAB — COMPREHENSIVE METABOLIC PANEL
ALT: 13 U/L (ref 0–44)
AST: 21 U/L (ref 15–41)
Albumin: 3.6 g/dL (ref 3.5–5.0)
Alkaline Phosphatase: 57 U/L (ref 38–126)
Anion gap: 8 (ref 5–15)
BUN: <5 mg/dL — ABNORMAL LOW (ref 6–20)
CO2: 25 mmol/L (ref 22–32)
Calcium: 8.9 mg/dL (ref 8.9–10.3)
Chloride: 106 mmol/L (ref 98–111)
Creatinine, Ser: 0.53 mg/dL — ABNORMAL LOW (ref 0.61–1.24)
GFR calc Af Amer: >60 mL/min (ref >60)
GFR calc non Af Amer: >60 mL/min (ref >60)
Glucose, Bld: 86 mg/dL (ref 70–99)
Potassium: 3.3 mmol/L — ABNORMAL LOW (ref 3.5–5.1)
Sodium: 139 mmol/L (ref 135–145)
Total Bilirubin: 3 mg/dL — ABNORMAL HIGH (ref 0.3–1.2)
Total Protein: 7.1 g/dL (ref 6.5–8.1)

## 2019-01-21 LAB — LIPASE, BLOOD: Lipase: 17 U/L (ref 11–51)

## 2019-01-21 MED ORDER — DIPHENHYDRAMINE HCL 50 MG/ML IJ SOLN
25.0000 mg | Freq: Once | INTRAMUSCULAR | Status: AC
  Administered 2019-01-21: 25 mg via INTRAVENOUS
  Filled 2019-01-21: qty 1

## 2019-01-21 MED ORDER — POLYETHYLENE GLYCOL 3350 17 G PO PACK: 17.0000 g | PACK | Freq: Every day | ORAL | Status: DC | PRN

## 2019-01-21 MED ORDER — HYDROXYUREA 500 MG PO CAPS
1000.0000 mg | ORAL_CAPSULE | Freq: Every day | ORAL | Status: DC
  Administered 2019-01-22 – 2019-01-24 (×3): 1000 mg via ORAL
  Filled 2019-01-21 (×3): qty 2

## 2019-01-21 MED ORDER — KETOROLAC TROMETHAMINE 15 MG/ML IJ SOLN
15.0000 mg | Freq: Four times a day (QID) | INTRAMUSCULAR | Status: DC
  Administered 2019-01-22 – 2019-01-24 (×11): 15 mg via INTRAVENOUS
  Filled 2019-01-21 (×12): qty 1

## 2019-01-21 MED ORDER — ENOXAPARIN SODIUM 40 MG/0.4ML ~~LOC~~ SOLN
40.0000 mg | Freq: Every day | SUBCUTANEOUS | Status: DC
  Filled 2019-01-21 (×2): qty 0.4

## 2019-01-21 MED ORDER — SENNOSIDES-DOCUSATE SODIUM 8.6-50 MG PO TABS
1.0000 | ORAL_TABLET | Freq: Two times a day (BID) | ORAL | Status: DC
  Administered 2019-01-22 (×2): 1 via ORAL
  Filled 2019-01-21 (×3): qty 1

## 2019-01-21 MED ORDER — METOCLOPRAMIDE HCL 5 MG/ML IJ SOLN
10.0000 mg | Freq: Once | INTRAMUSCULAR | Status: DC
  Filled 2019-01-21 (×2): qty 2

## 2019-01-21 MED ORDER — HYDROMORPHONE HCL 1 MG/ML IJ SOLN
2.0000 mg | Freq: Once | INTRAMUSCULAR | Status: AC
  Administered 2019-01-21: 2 mg via INTRAVENOUS
  Filled 2019-01-21: qty 2

## 2019-01-21 MED ORDER — SODIUM CHLORIDE 0.9 % IV BOLUS
1000.0000 mL | Freq: Once | INTRAVENOUS | Status: AC
  Administered 2019-01-21: 1000 mL via INTRAVENOUS

## 2019-01-21 MED ORDER — PROMETHAZINE HCL 25 MG/ML IJ SOLN
25.0000 mg | Freq: Once | INTRAMUSCULAR | Status: AC
  Administered 2019-01-21: 25 mg via INTRAVENOUS
  Filled 2019-01-21: qty 1

## 2019-01-21 MED ORDER — GABAPENTIN 300 MG PO CAPS
600.0000 mg | ORAL_CAPSULE | Freq: Three times a day (TID) | ORAL | Status: DC
  Administered 2019-01-22 – 2019-01-24 (×7): 600 mg via ORAL
  Filled 2019-01-21 (×8): qty 2

## 2019-01-21 MED ORDER — HYDROMORPHONE 1 MG/ML IV SOLN
INTRAVENOUS | Status: DC
  Administered 2019-01-22: 30 mg via INTRAVENOUS
  Administered 2019-01-22: 6.5 mg via INTRAVENOUS
  Administered 2019-01-22: 5 mg via INTRAVENOUS
  Administered 2019-01-22: 2 mg via INTRAVENOUS
  Administered 2019-01-22: 7 mg via INTRAVENOUS
  Administered 2019-01-22: 5 mg via INTRAVENOUS
  Administered 2019-01-22 – 2019-01-23 (×2): 30 mg via INTRAVENOUS
  Administered 2019-01-23: 5 mg via INTRAVENOUS
  Administered 2019-01-23: 6.5 mg via INTRAVENOUS
  Administered 2019-01-23: 5.5 mg via INTRAVENOUS
  Administered 2019-01-23: 3.5 mg via INTRAVENOUS
  Filled 2019-01-21 (×3): qty 30

## 2019-01-21 MED ORDER — PROMETHAZINE HCL 25 MG PO TABS
25.0000 mg | ORAL_TABLET | Freq: Once | ORAL | Status: AC
  Administered 2019-01-21: 25 mg via ORAL
  Filled 2019-01-21: qty 1

## 2019-01-21 MED ORDER — HYDROMORPHONE HCL 2 MG/ML IJ SOLN
2.0000 mg | Freq: Once | INTRAMUSCULAR | Status: AC
  Administered 2019-01-21: 2 mg via SUBCUTANEOUS
  Filled 2019-01-21: qty 1

## 2019-01-21 MED ORDER — NALOXONE HCL 0.4 MG/ML IJ SOLN: 0.4000 mg | INTRAMUSCULAR | Status: DC | PRN

## 2019-01-21 MED ORDER — OXYCODONE HCL 5 MG PO TABS
30.0000 mg | ORAL_TABLET | Freq: Once | ORAL | Status: AC
  Administered 2019-01-21: 30 mg via ORAL
  Filled 2019-01-21: qty 6

## 2019-01-21 MED ORDER — SODIUM CHLORIDE 0.9 % IV BOLUS
500.0000 mL | Freq: Once | INTRAVENOUS | Status: AC
  Administered 2019-01-21: 500 mL via INTRAVENOUS

## 2019-01-21 MED ORDER — SODIUM CHLORIDE 0.9% FLUSH: 9.0000 mL | INTRAVENOUS | Status: DC | PRN

## 2019-01-21 MED ORDER — CLINDAMYCIN PHOSPHATE 600 MG/50ML IV SOLN
600.0000 mg | Freq: Three times a day (TID) | INTRAVENOUS | Status: DC
  Administered 2019-01-22 – 2019-01-24 (×9): 600 mg via INTRAVENOUS
  Filled 2019-01-21 (×9): qty 50

## 2019-01-21 MED ORDER — KETOROLAC TROMETHAMINE 30 MG/ML IJ SOLN
30.0000 mg | Freq: Once | INTRAMUSCULAR | Status: AC
  Administered 2019-01-23: 22:00:00 30 mg via INTRAVENOUS
  Filled 2019-01-21 (×2): qty 1

## 2019-01-21 MED ORDER — PROMETHAZINE HCL 25 MG/ML IJ SOLN
6.2500 mg | Freq: Three times a day (TID) | INTRAMUSCULAR | Status: DC | PRN
  Administered 2019-01-22 – 2019-01-23 (×3): 6.25 mg via INTRAVENOUS
  Filled 2019-01-21 (×3): qty 1

## 2019-01-21 MED ORDER — SODIUM CHLORIDE 0.9% FLUSH: 10.0000 mL | INTRAVENOUS | Status: DC | PRN

## 2019-01-21 MED ORDER — SODIUM CHLORIDE 0.9% FLUSH
10.0000 mL | Freq: Two times a day (BID) | INTRAVENOUS | Status: DC
  Administered 2019-01-22 (×2): 10 mL
  Administered 2019-01-23: 23:00:00 20 mL
  Administered 2019-01-24: 10 mL

## 2019-01-21 MED ORDER — FOLIC ACID 1 MG PO TABS
1.0000 mg | ORAL_TABLET | Freq: Every day | ORAL | Status: DC
  Administered 2019-01-22 – 2019-01-24 (×3): 1 mg via ORAL
  Filled 2019-01-21 (×3): qty 1

## 2019-01-21 MED ORDER — HYDROMORPHONE HCL 1 MG/ML IJ SOLN
1.0000 mg | Freq: Once | INTRAMUSCULAR | Status: AC
  Administered 2019-01-21: 1 mg via INTRAVENOUS
  Filled 2019-01-21: qty 1

## 2019-01-21 MED ORDER — METOCLOPRAMIDE HCL 5 MG/ML IJ SOLN
10.0000 mg | Freq: Once | INTRAMUSCULAR | Status: AC
  Administered 2019-01-21: 10 mg via INTRAVENOUS
  Filled 2019-01-21: qty 2

## 2019-01-21 MED ORDER — DIPHENHYDRAMINE HCL 50 MG/ML IJ SOLN
50.0000 mg | Freq: Once | INTRAMUSCULAR | Status: AC
  Administered 2019-01-21: 07:00:00 50 mg via INTRAMUSCULAR
  Filled 2019-01-21: qty 1

## 2019-01-21 MED ORDER — DIPHENHYDRAMINE HCL 25 MG PO CAPS
25.0000 mg | ORAL_CAPSULE | ORAL | Status: DC | PRN
  Administered 2019-01-22: 25 mg via ORAL
  Filled 2019-01-21 (×2): qty 1

## 2019-01-21 MED ORDER — SODIUM CHLORIDE 0.9 % IV SOLN
INTRAVENOUS | Status: DC | PRN
  Administered 2019-01-22: 500 mL via INTRAVENOUS

## 2019-01-21 MED ORDER — POTASSIUM CHLORIDE 10 MEQ/100ML IV SOLN
10.0000 meq | INTRAVENOUS | Status: AC
  Administered 2019-01-21 (×2): 10 meq via INTRAVENOUS
  Filled 2019-01-21 (×2): qty 100
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
## 2019-01-21 NOTE — ED Notes (Signed)
Pt had an episode of nausea and vomiting.

## 2019-01-21 NOTE — ED Triage Notes (Signed)
Pt to triage via [facility]> EMS attempted to take pt to [facility] and pt refused.  C/o sickle cell pain all over.  EMS reports pt transported x 2 yesterday for same.  Pt states pain is worse with nausea and vomiting.

## 2019-01-21 NOTE — ED Notes (Signed)
Dinner tray ordered.
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
## 2019-01-21 NOTE — ED Notes (Signed)
[facility] called they are aware not time to give yet
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
## 2019-01-21 NOTE — ED Notes (Signed)
Pt wants to hold Reglan med until pain medication is given

## 2019-01-21 NOTE — ED Notes (Signed)
Attempted IV access. Not successful.  Waiting on IV team at this time.

## 2019-01-21 NOTE — ED Notes (Signed)
Report called to [facility]. RN was able to take report and will pass information on if pt is not there by 1900

## 2019-01-21 NOTE — ED Notes (Signed)
Pt refusing ketorolac. Pt states it does nothing for his pain.  Marland Kitchen Requesting dillaudid.  Pt is moaning and moving around in pain.

## 2019-01-21 NOTE — Telephone Encounter (Signed)
  Patient [facility]   Rufino Staup, a 28 year old male with a medical history significant for sickle cell disease, chronic pain syndrome, opiate dependence and tolerance, history of anemia of chronic disease and history of polysubstance abuse notified on-call service for [place] complaining of facial swelling.  Patient states that he was diagnosed with left-sided facial swelling and prescribed antibiotics.  He has been unable to keep any medications or food down due to excessive nausea and vomiting.  Also, patient says that the right side of his face is starting to swell as well.  Patient was given clear instructions to report to the emergency department for further evaluation. The patient verbalized understanding.   Donia Pounds  APRN, MSN, FNP-C Patient [place] [address] [place], [place] [address] [phone]

## 2019-01-21 NOTE — ED Notes (Signed)
Hooked patient to the monitor gave patient some warm blankets patient is resting with call bell in reach

## 2019-01-21 NOTE — ED Triage Notes (Signed)
Patient complaining of sickle cell crisis. Patient was at [facility] ed earlier tonight and left due to him not getting IV pain medication. Patient went to [facility] and left AMA. Patient came back to [facility] stating that he is in pain.

## 2019-01-22 DIAGNOSIS — R112 Nausea with vomiting, unspecified: Secondary | ICD-10-CM

## 2019-01-22 DIAGNOSIS — F1910 Other psychoactive substance abuse, uncomplicated: Secondary | ICD-10-CM

## 2019-01-22 DIAGNOSIS — D72829 Elevated white blood cell count, unspecified: Secondary | ICD-10-CM

## 2019-01-22 DIAGNOSIS — F17209 Nicotine dependence, unspecified, with unspecified nicotine-induced disorders: Secondary | ICD-10-CM

## 2019-01-22 DIAGNOSIS — L03211 Cellulitis of face: Secondary | ICD-10-CM

## 2019-01-22 LAB — CBC
HCT: 22.5 % — ABNORMAL LOW (ref 39.0–52.0)
Hemoglobin: 7.6 g/dL — ABNORMAL LOW (ref 13.0–17.0)
MCH: 33.2 pg (ref 26.0–34.0)
MCHC: 33.8 g/dL (ref 30.0–36.0)
MCV: 98.3 fL (ref 80.0–100.0)
Platelets: 245 10*3/uL (ref 150–400)
RBC: 2.29 MIL/uL — ABNORMAL LOW (ref 4.22–5.81)
RDW: 17.3 % — ABNORMAL HIGH (ref 11.5–15.5)
WBC: 13 10*3/uL — ABNORMAL HIGH (ref 4.0–10.5)
nRBC: 0.5 % — ABNORMAL HIGH (ref 0.0–0.2)

## 2019-01-22 LAB — BASIC METABOLIC PANEL
Anion gap: <3 — ABNORMAL LOW (ref 5–15)
BUN: 7 mg/dL (ref 6–20)
CO2: 28 mmol/L (ref 22–32)
Calcium: 8.5 mg/dL — ABNORMAL LOW (ref 8.9–10.3)
Chloride: 108 mmol/L (ref 98–111)
Creatinine, Ser: 0.51 mg/dL — ABNORMAL LOW (ref 0.61–1.24)
GFR calc Af Amer: >60 mL/min (ref >60)
GFR calc non Af Amer: >60 mL/min (ref >60)
Glucose, Bld: 85 mg/dL (ref 70–99)
Potassium: 3.8 mmol/L (ref 3.5–5.1)
Sodium: 134 mmol/L — ABNORMAL LOW (ref 135–145)

## 2019-01-22 MED ORDER — ACETAMINOPHEN 325 MG PO TABS: 650.0000 mg | ORAL_TABLET | Freq: Four times a day (QID) | ORAL | Status: DC | PRN

## 2019-01-22 MED ORDER — HYDROMORPHONE HCL 1 MG/ML IJ SOLN
1.0000 mg | Freq: Once | INTRAMUSCULAR | Status: AC
  Administered 2019-01-22: 1 mg via INTRAVENOUS
  Filled 2019-01-22: qty 1

## 2019-01-22 MED ORDER — OXYCODONE HCL 5 MG PO TABS
10.0000 mg | ORAL_TABLET | ORAL | Status: DC | PRN
  Administered 2019-01-22 – 2019-01-23 (×3): 10 mg via ORAL
  Filled 2019-01-22 (×4): qty 2

## 2019-01-22 NOTE — Progress Notes (Signed)
Subjective: Matthew Shannon, a 28 year old male with a medical history significant for sickle cell disease, chronic pain syndrome, opiate dependence and tolerance, drug seeking behavior, history of anemia of chronic disease, and history of polysubstance abuse was admitted for facial cellulitis in the setting of sickle cell pain crisis.   Patient says that pain intensity is 8/10 primarily to face and lower back.  Low back pain is characterized as constant and throbbing.  Patient continues to have a difficult time chewing due to cellulitis. He denies headache, blurred vision, chest pain, shortness of breath, urinary symptoms, nausea, vomiting, or diarrhea.  Objective:  Vital signs in last 24 hours:  Vitals:   01/22/19 0813 01/22/19 0942 01/22/19 1208 01/22/19 1348  BP: (!) 98/54 (!) 110/56  107/64  Pulse: 65 74  84  Resp: 16 14 12 14   Temp: 97.8 F (36.6 C) 98.3 F (36.8 C)  98.3 F (36.8 C)  TempSrc: Oral Oral  Oral  SpO2: 100% 96% 95% 96%    Intake/Output from previous day:   Intake/Output Summary (Last 24 hours) at 01/22/2019 1521 Last data filed at 01/22/2019 1438 Gross per 24 hour  Intake 1543.01 ml  Output 1200 ml  Net 343.01 ml    Physical Exam: General: Alert, awake, oriented x3, in no acute distress.  HEENT: [place]/AT PEERL, EOMI Neck: Trachea midline,  no masses, no thyromegal,y no JVD, no carotid bruit OROPHARYNX:  Moist, No exudate/ erythema/lesions.  Heart: Regular rate and rhythm, without murmurs, rubs, gallops, PMI non-displaced, no heaves or thrills on palpation.  Lungs: Clear to auscultation, no wheezing or rhonchi noted. No increased vocal fremitus resonant to percussion  Abdomen: Soft, nontender, nondistended, positive bowel sounds, no masses no hepatosplenomegaly noted..  Neuro: No focal neurological deficits noted cranial nerves II through XII grossly intact. DTRs 2+ bilaterally upper and lower extremities. Strength 5 out of 5 in bilateral upper and lower  extremities. Musculoskeletal: No warm swelling or erythema around joints, no spinal tenderness noted. Psychiatric: Patient alert and oriented x3, good insight and cognition, good recent to remote recall. Lymph node survey: No cervical axillary or inguinal lymphadenopathy noted.  Lab Results:  Basic Metabolic Panel:    Component Value Date/Time   NA 139 01/21/2019 1153   NA 137 11/29/2018 1224   K 3.3 (L) 01/21/2019 1153   CL 106 01/21/2019 1153   CO2 25 01/21/2019 1153   BUN <5 (L) 01/21/2019 1153   BUN 5 (L) 11/29/2018 1224   CREATININE 0.53 (L) 01/21/2019 1153   GLUCOSE 86 01/21/2019 1153   CALCIUM 8.9 01/21/2019 1153   CBC:    Component Value Date/Time   WBC 13.0 (H) 01/22/2019 0759   HGB 7.6 (L) 01/22/2019 0759   HGB 8.1 (L) 11/29/2018 1224   HCT 22.5 (L) 01/22/2019 0759   HCT 24.6 (L) 11/29/2018 1224   PLT 245 01/22/2019 0759   PLT 330 11/29/2018 1224   MCV 98.3 01/22/2019 0759   MCV 104 (H) 11/29/2018 1224   NEUTROABS 8.9 (H) 01/21/2019 1153   NEUTROABS 6.4 11/29/2018 1224   LYMPHSABS 6.5 (H) 01/21/2019 1153   LYMPHSABS 3.9 (H) 11/29/2018 1224   MONOABS 1.9 (H) 01/21/2019 1153   EOSABS 0.5 01/21/2019 1153   EOSABS 0.3 11/29/2018 1224   BASOSABS 0.1 01/21/2019 1153   BASOSABS 0.1 11/29/2018 1224    Recent Results (from the past 240 hour(s))  SARS CORONAVIRUS 2 (TAT 6-24 HRS) Nasopharyngeal Nasopharyngeal Swab     Status: None   Collection  Time: 01/21/19  5:45 PM   Specimen: Nasopharyngeal Swab  Result Value Ref Range Status   SARS Coronavirus 2 NEGATIVE NEGATIVE Final    Comment: (NOTE) SARS-CoV-2 target nucleic acids are NOT DETECTED. The SARS-CoV-2 RNA is generally detectable in upper and lower respiratory specimens during the acute phase of infection. Negative results do not preclude SARS-CoV-2 infection, do not rule out co-infections with other pathogens, and should not be used as the sole basis for treatment or other patient management  decisions. Negative results must be combined with clinical observations, patient history, and epidemiological information. The expected result is Negative. Fact Sheet for Patients: SugarRoll.be Fact Sheet for Healthcare Providers: https://www.woods-mathews.com/ This test is not yet approved or cleared by the [place] FDA and  has been authorized for detection and/or diagnosis of SARS-CoV-2 by FDA under an Emergency Use Authorization (EUA). This EUA will remain  in effect (meaning this test can be used) for the duration of the COVID-19 declaration under Section 56 4(b)(1) of the Act, 21 U.S.C. section 360bbb-3(b)(1), unless the authorization is terminated or revoked sooner. Performed at [facility], [place] [address] [place], [place] [address]     Studies/Results: DG Chest Portable 1 View  Result Date: 01/21/2019 CLINICAL DATA:  Chest pain. History of sickle cell. EXAM: PORTABLE CHEST 1 VIEW COMPARISON:  01/20/2019 FINDINGS: Cardiomediastinal contours are stable with cardiac enlargement. Lung volumes are low. No signs of dense opacification or evidence of pleural effusion. Rightward curvature of the spine is again noted with H-shaped vertebral bodies. No signs of acute bone process. Hazy opacity with increased interstitial markings has increased at the right lung base since the previous study. IMPRESSION: 1. Low lung volumes with cardiac enlargement. 2. Increased interstitial markings at the right lung base some of which may be chronic. Developing pneumonia is also considered. Electronically Signed   By: Zetta Bills M.D.   On: 01/21/2019 11:24   DG Chest Portable 1 View  Result Date: 01/20/2019 CLINICAL DATA:  Sickle cell pain. EXAM: PORTABLE CHEST 1 VIEW COMPARISON:  Chest x-rays dated 01/09/2019 and 09/28/2018 and CT scan of the chest dated 10/06/2016 FINDINGS: The heart size and pulmonary vascularity are normal. Lungs are clear  except for slight chronic accentuation of the interstitial markings at the right lung base. IMPRESSION: No acute abnormalities. Electronically Signed   By: Lorriane Shire M.D.   On: 01/20/2019 19:45    Medications: Scheduled Meds: . enoxaparin (LOVENOX) injection  40 mg Subcutaneous Daily  . folic acid  1 mg Oral Daily  . gabapentin  600 mg Oral TID  . HYDROmorphone   Intravenous Q4H  . hydroxyurea  1,000 mg Oral Daily  . ketorolac  15 mg Intravenous Q6H  . ketorolac  30 mg Intravenous Once  . metoCLOPramide (REGLAN) injection  10 mg Intravenous Once  . senna-docusate  1 tablet Oral BID  . sodium chloride flush  10-40 mL Intracatheter Q12H   Continuous Infusions: . sodium chloride Stopped (01/22/19 0719)  . clindamycin (CLEOCIN) IV 600 mg (01/22/19 1356)   PRN Meds:.sodium chloride, acetaminophen, diphenhydrAMINE, naloxone **AND** sodium chloride flush, oxyCODONE, polyethylene glycol, promethazine  Consultants:  None  Procedures:  None  Antibiotics:  IV Clindamycin  Assessment/Plan: Principal Problem:   Sickle cell pain crisis ([place]) Active Problems:   Leukocytosis   Tobacco use disorder, continuous   Polysubstance abuse (HCC)   Facial cellulitis   Nausea and vomiting  Sickle cell disease with pain crisis: Discontinue IV fluids. Continue weightbase Dilaudid  PCA, no changes in settings on today. Oxycodone 10 mg every 4 hours as needed for severe, breakthrough pain Monitor pain scale regularly and monitor vitals closely Supplemental oxygen as needed.  Sickle cell anemia: Hemoglobin 7.6 today, consistent with patient's baseline.  No clinical indication for blood transfusion at this time.  Continue to follow CBC.  Facial cellulitis: Patient continues to have bilateral facial cellulitis.  Swelling to left greater than right.  Continue IV clindamycin  Leukocytosis: WBCs 13,000, improved from previous.  Continue IV antibiotics.  Patient afebrile.  Repeat CBC in  a.m.  Hypokalemia: Stable.  Repeat BMP in a.m.  Nausea and vomiting: Patient states that nausea and vomiting have improved on antiemetics.  He is tolerating diet well.  History of polysubstance abuse: Patient denies illicit drug use.  Urine drug screen pending.  Chronic pain syndrome: Continue home medications  Tobacco use disorder: Patient counseled at length.  Not ready to quit at this time. Code Status: Full Code Family Communication: N/A Disposition Plan: Not yet ready for discharge  Siria Calandro Rennis PettyMoore Chabeli Barsamian  APRN, MSN, FNP-C [facility] [address]  , [place] [id number] [phone]  If 7PM-7AM, please contact night-coverage.  01/22/2019, 3:21 PM  LOS: 1 day    This note was prepared using Dragon speech recognition software, errors in dictation are unintentional.

## 2019-01-23 LAB — CBC
HCT: 21.4 % — ABNORMAL LOW (ref 39.0–52.0)
Hemoglobin: 7.1 g/dL — ABNORMAL LOW (ref 13.0–17.0)
MCH: 33.2 pg (ref 26.0–34.0)
MCHC: 33.2 g/dL (ref 30.0–36.0)
MCV: 100 fL (ref 80.0–100.0)
Platelets: 268 10*3/uL (ref 150–400)
RBC: 2.14 MIL/uL — ABNORMAL LOW (ref 4.22–5.81)
RDW: 18.6 % — ABNORMAL HIGH (ref 11.5–15.5)
WBC: 15.1 10*3/uL — ABNORMAL HIGH (ref 4.0–10.5)
nRBC: 0.7 % — ABNORMAL HIGH (ref 0.0–0.2)

## 2019-01-23 LAB — RAPID URINE DRUG SCREEN, HOSP PERFORMED
Amphetamines: NOT DETECTED
Barbiturates: NOT DETECTED
Benzodiazepines: NOT DETECTED
Cocaine: NOT DETECTED
Opiates: POSITIVE — AB
Tetrahydrocannabinol: NOT DETECTED

## 2019-01-23 MED ORDER — PROMETHAZINE HCL 25 MG/ML IJ SOLN
12.5000 mg | Freq: Three times a day (TID) | INTRAMUSCULAR | Status: AC | PRN
  Administered 2019-01-23 – 2019-01-24 (×2): 12.5 mg via INTRAVENOUS
  Filled 2019-01-23 (×2): qty 1

## 2019-01-23 MED ORDER — HYDROMORPHONE 1 MG/ML IV SOLN
INTRAVENOUS | Status: DC
  Administered 2019-01-23: 8.5 mg via INTRAVENOUS
  Administered 2019-01-23: 0 via INTRAVENOUS
  Administered 2019-01-24: 7.5 mg via INTRAVENOUS

## 2019-01-23 MED ORDER — PROMETHAZINE HCL 25 MG PO TABS
12.5000 mg | ORAL_TABLET | ORAL | Status: DC | PRN
  Administered 2019-01-23: 12:00:00 12.5 mg via ORAL
  Filled 2019-01-23: qty 1

## 2019-01-23 MED ORDER — HYDROMORPHONE HCL 1 MG/ML IJ SOLN
1.0000 mg | Freq: Once | INTRAMUSCULAR | Status: AC
  Administered 2019-01-23: 1 mg via INTRAVENOUS
  Filled 2019-01-23: qty 1

## 2019-01-23 MED ORDER — OXYCODONE HCL 5 MG PO TABS
15.0000 mg | ORAL_TABLET | ORAL | Status: DC
  Administered 2019-01-23 – 2019-01-24 (×5): 15 mg via ORAL
  Filled 2019-01-23 (×7): qty 3

## 2019-01-23 MED ORDER — PROMETHAZINE HCL 25 MG/ML IJ SOLN
25.0000 mg | Freq: Once | INTRAMUSCULAR | Status: AC
  Administered 2019-01-23: 25 mg via INTRAVENOUS
  Filled 2019-01-23: qty 1

## 2019-01-23 NOTE — Progress Notes (Addendum)
Subjective: Matthew Shannon, a 28 year old male with a medical history significant for sickle cell disease, chronic pain syndrome, opiate dependence and tolerance, drug-seeking behavior, history of anemia of chronic disease, and history of polysubstance abuse was admitted for facial cellulitis in the setting of sickle cell pain crisis.  Patient states that pain persists despite IV Dilaudid PCA.  Pain is primarily to face and lower back.  Pain is characterized as constant and throbbing.  Pain intensity 7-8/10.  Patient denies headache, blurred vision, chest pain, shortness of breath, urinary symptoms, vomiting, or diarrhea.  He continues to endorse periodic vomiting and nausea.  Objective:  Vital signs in last 24 hours:  Vitals:   01/23/19 0840 01/23/19 1001 01/23/19 1231 01/23/19 1321  BP:  113/68    Pulse:  89    Resp: 16 16 16 17   Temp:  98.2 F (36.8 C)    TempSrc:  Oral    SpO2: 94% 96% 97% 95%    Intake/Output from previous day:   Intake/Output Summary (Last 24 hours) at 01/23/2019 1354 Last data filed at 01/23/2019 1300 Gross per 24 hour  Intake 380.47 ml  Output 1100 ml  Net -719.53 ml    Physical Exam: General: Alert, awake, oriented x3, in no acute distress.  HEENT: /AT PEERL, EOMI Neck: Trachea midline,  no masses, no thyromegal,y no JVD, no carotid bruit OROPHARYNX:  Moist, No exudate/ erythema/lesions.  Heart: Regular rate and rhythm, without murmurs, rubs, gallops, PMI non-displaced, no heaves or thrills on palpation.  Lungs: Clear to auscultation, no wheezing or rhonchi noted. No increased vocal fremitus resonant to percussion  Abdomen: Soft, nontender, nondistended, positive bowel sounds, no masses no hepatosplenomegaly noted..  Neuro: No focal neurological deficits noted cranial nerves II through XII grossly intact. DTRs 2+ bilaterally upper and lower extremities. Strength 5 out of 5 in bilateral upper and lower extremities. Musculoskeletal: No warm swelling  or erythema around joints, no spinal tenderness noted. Psychiatric: Patient alert and oriented x3, good insight and cognition, good recent to remote recall. Lymph node survey: No cervical axillary or inguinal lymphadenopathy noted.  Lab Results:  Basic Metabolic Panel:    Component Value Date/Time   NA 134 (L) 01/22/2019 0759   NA 137 11/29/2018 1224   K 3.8 01/22/2019 0759   CL 108 01/22/2019 0759   CO2 28 01/22/2019 0759   BUN 7 01/22/2019 0759   BUN 5 (L) 11/29/2018 1224   CREATININE 0.51 (L) 01/22/2019 0759   GLUCOSE 85 01/22/2019 0759   CALCIUM 8.5 (L) 01/22/2019 0759   CBC:    Component Value Date/Time   WBC 15.1 (H) 01/23/2019 1315   HGB 7.1 (L) 01/23/2019 1315   HGB 8.1 (L) 11/29/2018 1224   HCT 21.4 (L) 01/23/2019 1315   HCT 24.6 (L) 11/29/2018 1224   PLT 268 01/23/2019 1315   PLT 330 11/29/2018 1224   MCV 100.0 01/23/2019 1315   MCV 104 (H) 11/29/2018 1224   NEUTROABS 8.9 (H) 01/21/2019 1153   NEUTROABS 6.4 11/29/2018 1224   LYMPHSABS 6.5 (H) 01/21/2019 1153   LYMPHSABS 3.9 (H) 11/29/2018 1224   MONOABS 1.9 (H) 01/21/2019 1153   EOSABS 0.5 01/21/2019 1153   EOSABS 0.3 11/29/2018 1224   BASOSABS 0.1 01/21/2019 1153   BASOSABS 0.1 11/29/2018 1224    Recent Results (from the past 240 hour(s))  SARS CORONAVIRUS 2 (TAT 6-24 HRS) Nasopharyngeal Nasopharyngeal Swab     Status: None   Collection Time: 01/21/19  5:45 PM   Specimen:  Nasopharyngeal Swab  Result Value Ref Range Status   SARS Coronavirus 2 NEGATIVE NEGATIVE Final    Comment: (NOTE) SARS-CoV-2 target nucleic acids are NOT DETECTED. The SARS-CoV-2 RNA is generally detectable in upper and lower respiratory specimens during the acute phase of infection. Negative results do not preclude SARS-CoV-2 infection, do not rule out co-infections with other pathogens, and should not be used as the sole basis for treatment or other patient management decisions. Negative results must be combined with clinical  observations, patient history, and epidemiological information. The expected result is Negative. Fact Sheet for Patients: SugarRoll.be Fact Sheet for Healthcare Providers: https://www.woods-mathews.com/ This test is not yet approved or cleared by the [place] FDA and  has been authorized for detection and/or diagnosis of SARS-CoV-2 by FDA under an Emergency Use Authorization (EUA). This EUA will remain  in effect (meaning this test can be used) for the duration of the COVID-19 declaration under Section 56 4(b)(1) of the Act, 21 U.S.C. section 360bbb-3(b)(1), unless the authorization is terminated or revoked sooner. Performed at [facility], [place] [address] [place], [place] [address]     Studies/Results: No results found.  Medications: Scheduled Meds: . enoxaparin (LOVENOX) injection  40 mg Subcutaneous Daily  . folic acid  1 mg Oral Daily  . gabapentin  600 mg Oral TID  . HYDROmorphone   Intravenous Q4H  . hydroxyurea  1,000 mg Oral Daily  . ketorolac  15 mg Intravenous Q6H  . ketorolac  30 mg Intravenous Once  . metoCLOPramide (REGLAN) injection  10 mg Intravenous Once  . oxyCODONE  15 mg Oral Q4H while awake  . sodium chloride flush  10-40 mL Intracatheter Q12H   Continuous Infusions: . sodium chloride 10 mL/hr at 01/22/19 1846  . clindamycin (CLEOCIN) IV 600 mg (01/23/19 0515)   PRN Meds:.sodium chloride, acetaminophen, diphenhydrAMINE, naloxone **AND** sodium chloride flush, polyethylene glycol, promethazine  Consultants:  None  Procedures:  None  Antibiotics:  IV clindamycin  Assessment/Plan: Principal Problem:   Sickle cell pain crisis ([place]) Active Problems:   Leukocytosis   Tobacco use disorder, continuous   Polysubstance abuse (HCC)   Facial cellulitis   Nausea and vomiting  Sickle cell disease with pain crisis: Waning Dilaudid PCA, settings decreased on today. Oxycodone scheduled every 4  hours while awake. Monitor pain scale regularly and monitor vitals closely. Refrain from clinician assisted IV Dilaudid boluses.  Sickle cell anemia: Hemoglobin stable.  Consistent with patient's baseline.  There is no clinical indication for blood transfusion at this time.  Continue to follow.  Facial cellulitis: Continue IV clindamycin.  Appears to be improved today  Leukocytosis: WBC stable, improved from previous.  Continue IV antibiotics.  Patient afebrile.  Hypokalemia: Resolved.  Continue to follow closely.  Nausea vomiting: Patient is requesting to increase the frequency of promethazine.  Discontinued IV promethazine.  Promethazine 12.5 mg by mouth every 4 hours as needed for nausea and/or vomiting.  History of polysubstance abuse: Urine drug screen reviewed, no illicit substances present.  Chronic pain syndrome: Continue home medications.  Tobacco use disorder: Patient counseled at length during admission, he is not ready to quit.    Code Status: Full Code Family Communication: N/A Disposition Plan: Not yet ready for discharge  [place], MSN, FNP-C Patient [facility] [address] [place], [place] [address] [phone]  If 5PM-7AM, please contact night-coverage.  01/23/2019, 1:54 PM  LOS: 2 days    Donnie Mesa  Shelton Silvas, MSN, FNP-C Patient Care [facility] [address] [place], [place] [address] [phone]

## 2019-01-23 NOTE — Progress Notes (Signed)
Patient requested I call [place] to ask if Phenergan can be more frequent than every eight hours. Call made to her office and spoke with Mickel Baas; awaiting a call back. Donne Hazel, RN

## 2019-01-24 ENCOUNTER — Emergency Department: Payer: Medicaid Other

## 2019-01-24 ENCOUNTER — Other Ambulatory Visit: Payer: Self-pay

## 2019-01-24 ENCOUNTER — Emergency Department
Admission: EM | Admit: 2019-01-24 | Discharge: 2019-01-25 | Disposition: A | Discharge status: Other Acute Inpatient Hospital | Payer: Medicaid Other | Attending: Emergency Medicine | Admitting: Emergency Medicine

## 2019-01-24 DIAGNOSIS — D5700 Hb-SS disease with crisis, unspecified: Secondary | ICD-10-CM | POA: Insufficient documentation

## 2019-01-24 DIAGNOSIS — Z20828 Contact with and (suspected) exposure to other viral communicable diseases: Secondary | ICD-10-CM | POA: Insufficient documentation

## 2019-01-24 DIAGNOSIS — Z720 Tobacco use: Secondary | ICD-10-CM

## 2019-01-24 DIAGNOSIS — G894 Chronic pain syndrome: Secondary | ICD-10-CM

## 2019-01-24 DIAGNOSIS — R079 Chest pain, unspecified: Secondary | ICD-10-CM

## 2019-01-24 DIAGNOSIS — F17210 Nicotine dependence, cigarettes, uncomplicated: Secondary | ICD-10-CM | POA: Insufficient documentation

## 2019-01-24 LAB — COMPREHENSIVE METABOLIC PANEL
ALT: 34 U/L (ref 0–44)
AST: 42 U/L — ABNORMAL HIGH (ref 15–41)
Albumin: 3.8 g/dL (ref 3.5–5.0)
Alkaline Phosphatase: 83 U/L (ref 38–126)
Anion gap: 8 (ref 5–15)
BUN: 9 mg/dL (ref 6–20)
CO2: 28 mmol/L (ref 22–32)
Calcium: 9.1 mg/dL (ref 8.9–10.3)
Chloride: 103 mmol/L (ref 98–111)
Creatinine, Ser: 0.54 mg/dL — ABNORMAL LOW (ref 0.61–1.24)
GFR calc Af Amer: >60 mL/min (ref >60)
GFR calc non Af Amer: >60 mL/min (ref >60)
Glucose, Bld: 105 mg/dL — ABNORMAL HIGH (ref 70–99)
Potassium: 4 mmol/L (ref 3.5–5.1)
Sodium: 139 mmol/L (ref 135–145)
Total Bilirubin: 4.2 mg/dL — ABNORMAL HIGH (ref 0.3–1.2)
Total Protein: 7.4 g/dL (ref 6.5–8.1)

## 2019-01-24 LAB — CBC
HCT: 23.2 % — ABNORMAL LOW (ref 39.0–52.0)
Hemoglobin: 7.7 g/dL — ABNORMAL LOW (ref 13.0–17.0)
MCH: 33.8 pg (ref 26.0–34.0)
MCHC: 33.2 g/dL (ref 30.0–36.0)
MCV: 101.8 fL — ABNORMAL HIGH (ref 80.0–100.0)
Platelets: 295 10*3/uL (ref 150–400)
RBC: 2.28 MIL/uL — ABNORMAL LOW (ref 4.22–5.81)
RDW: 20 % — ABNORMAL HIGH (ref 11.5–15.5)
WBC: 18 10*3/uL — ABNORMAL HIGH (ref 4.0–10.5)
nRBC: 1.3 % — ABNORMAL HIGH (ref 0.0–0.2)

## 2019-01-24 LAB — PROCALCITONIN: Procalcitonin: <0.1 ng/mL

## 2019-01-24 LAB — RETICULOCYTES
Immature Retic Fract: 38.2 % — ABNORMAL HIGH (ref 2.3–15.9)
RBC.: 2.24 MIL/uL — ABNORMAL LOW (ref 4.22–5.81)
Retic Count, Absolute: 333.2 10*3/uL — ABNORMAL HIGH (ref 19.0–186.0)
Retic Ct Pct: 15.3 % — ABNORMAL HIGH (ref 0.4–3.1)

## 2019-01-24 LAB — CBC WITH DIFFERENTIAL/PLATELET
Abs Immature Granulocytes: 0.31 10*3/uL — ABNORMAL HIGH (ref 0.00–0.07)
Basophils Absolute: 0.1 10*3/uL (ref 0.0–0.1)
Basophils Relative: 0 %
Eosinophils Absolute: 0.5 10*3/uL (ref 0.0–0.5)
Eosinophils Relative: 3 %
HCT: 22.1 % — ABNORMAL LOW (ref 39.0–52.0)
Hemoglobin: 7.4 g/dL — ABNORMAL LOW (ref 13.0–17.0)
Immature Granulocytes: 2 %
Lymphocytes Relative: 20 %
Lymphs Abs: 3.6 10*3/uL (ref 0.7–4.0)
MCH: 33 pg (ref 26.0–34.0)
MCHC: 33.5 g/dL (ref 30.0–36.0)
MCV: 98.7 fL (ref 80.0–100.0)
Monocytes Absolute: 1.3 10*3/uL — ABNORMAL HIGH (ref 0.1–1.0)
Monocytes Relative: 7 %
Neutro Abs: 12.4 10*3/uL — ABNORMAL HIGH (ref 1.7–7.7)
Neutrophils Relative %: 68 %
Platelets: 305 10*3/uL (ref 150–400)
RBC: 2.24 MIL/uL — ABNORMAL LOW (ref 4.22–5.81)
RDW: 19.9 % — ABNORMAL HIGH (ref 11.5–15.5)
WBC: 18.1 10*3/uL — ABNORMAL HIGH (ref 4.0–10.5)
nRBC: 1.9 % — ABNORMAL HIGH (ref 0.0–0.2)

## 2019-01-24 LAB — TROPONIN I (HIGH SENSITIVITY): Troponin I (High Sensitivity): 7 ng/L (ref <18)

## 2019-01-24 LAB — URINE CULTURE: Culture: NO GROWTH

## 2019-01-24 MED ORDER — HYDROMORPHONE HCL 1 MG/ML IJ SOLN: 0.5000 mg | Freq: Once | INTRAMUSCULAR | Status: DC

## 2019-01-24 MED ORDER — CLINDAMYCIN HCL 300 MG PO CAPS: 300.0000 mg | ORAL_CAPSULE | Freq: Three times a day (TID) | ORAL | 0 refills | Status: AC

## 2019-01-24 MED ORDER — KCL IN DEXTROSE-NACL 20-5-0.45 MEQ/L-%-% IV SOLN
Freq: Once | INTRAVENOUS | Status: AC
  Filled 2019-01-24: qty 1000

## 2019-01-24 MED ORDER — CLINDAMYCIN PHOSPHATE 600 MG/50ML IV SOLN
600.0000 mg | Freq: Once | INTRAVENOUS | Status: AC
  Administered 2019-01-25: 01:00:00 600 mg via INTRAVENOUS
  Filled 2019-01-24: qty 50

## 2019-01-24 MED ORDER — PROMETHAZINE HCL 25 MG/ML IJ SOLN
12.5000 mg | Freq: Once | INTRAMUSCULAR | Status: AC
  Administered 2019-01-24: 21:00:00 12.5 mg via INTRAVENOUS

## 2019-01-24 MED ORDER — KETOROLAC TROMETHAMINE 30 MG/ML IJ SOLN
15.0000 mg | Freq: Once | INTRAMUSCULAR | Status: AC
  Administered 2019-01-25: 15 mg via INTRAVENOUS
  Filled 2019-01-24: qty 1

## 2019-01-24 MED ORDER — SODIUM CHLORIDE 0.9% FLUSH
3.0000 mL | Freq: Once | INTRAVENOUS | Status: AC
  Administered 2019-01-24: 3 mL via INTRAVENOUS

## 2019-01-24 MED ORDER — HYDROMORPHONE HCL 1 MG/ML IJ SOLN
2.0000 mg | INTRAMUSCULAR | Status: AC
  Administered 2019-01-24 (×3): 2 mg via INTRAVENOUS
  Filled 2019-01-24 (×3): qty 2

## 2019-01-24 MED ORDER — DIPHENHYDRAMINE HCL 50 MG/ML IJ SOLN
12.5000 mg | Freq: Once | INTRAMUSCULAR | Status: AC
  Administered 2019-01-24: 12.5 mg via INTRAVENOUS
  Filled 2019-01-24: qty 1

## 2019-01-24 NOTE — ED Notes (Signed)
IV team at bedside 

## 2019-01-24 NOTE — ED Triage Notes (Signed)
Pt comes into the ED via EMS from [place], pt states he was on the way to the hospital when he pulled over and called 911, pt c/o pain all over in the neck and back with N/V for several days. With a hx of sickle cell.

## 2019-01-24 NOTE — ED Notes (Signed)
Pt refusing to allow staff to attempt Iv access, IV team consult order initiated.

## 2019-01-24 NOTE — Discharge Summary (Signed)
Physician Discharge Summary  Matthew Shannon ZOX:[id number] DOB: 09-27-1990 DOA: 01/21/2019  PCP: [name], MD  Admit date: 01/21/2019  Discharge date: 01/25/2019  Discharge Diagnoses:  Principal Problem:   Sickle cell pain crisis (HCC) Active Problems:   Leukocytosis   Tobacco use disorder, continuous   Polysubstance abuse (HCC)   Facial cellulitis   Nausea and vomiting   Discharge Condition: Stable  Disposition:  Follow-up Information    Massie Maroon, FNP Follow up in 1 week(s).   Specialty: Family Medicine Contact information: [address] [place] [address] [phone]          Pt is discharged. Follow up with [facility] this week to have labs evaluated. Matthew Shannon is instructed to increase activity slowly and balance with rest for the next few days, and use prescribed medication to complete treatment of pain  Diet: Regular Wt Readings from Last 3 Encounters:  01/24/19 61.2 kg  01/21/19 61.2 kg  01/20/19 61.2 kg    History of present illness:  Matthew Shannon, a 28 year old male with a medical history significant for sickle cell disease, chronic pain syndrome, opiate dependence and tolerance, drug-seeking behavior, history of anemia of chronic disease, and history of polysubstance abuse presented to ER complaining of bilateral face pain, low back, and mid chest pain.  Patient states that low back and mid chest is consistent with his typical pain crisis.  However he was recently hospitalized at [facility] for facial cellulitis and treated with IV antibiotics.  Patient has a history of chronic osteomyelitis.  He says that symptoms started with oral swelling and increased temperature.  Patient was discharged with Zyvox and Augmentin.  He states that he has been taking medications consistently, however on yesterday he began to have increased nausea and vomiting.  He says that he has been unable to tolerate oral medications due to the  symptoms.  Patient has been treated at 3 different emergency room settings over the past 24 hours.  Patient was treated and evaluated in [place], ER on 01/20/2019, he later returned around 2 AM for the same problem, was treated with Dilaudid per his care plan and discharged home.  Patient immediately left the ER at [facility] and went to [facility], ER he said that the weight had been too long at [facility] and he felt as if this problem was not resolved.  He says that pain has been worsening.  He characterizes pain as constant and throbbing to mid chest and low back.  Pain intensity is 10/10.  Patient denies fever, chills, recent travel, sick contacts, or exposure to COVID-19.  COVID-19 test during previous admission was negative.  He denies headache, shortness of breath, dizziness, paresthesias, urinary symptoms, nausea, vomiting, diarrhea, or constipation.  ER course: Patient's potassium mildly decreased at 3.3.  Total bilirubin is 3.0, which is improved from previous.  Serum lipase unremarkable.  WBC 17.8, patient currently afebrile.  Hemoglobin is 7.8, consistent with patient's baseline of 7.0-8.0 g/dL.  Oxygen saturation is 100% on room air.  Vital signs are reassuring.  COVID-19 test pending.  Urine drug screen pending.  Patient will transfer to [facility] MedSurg unit for treatment of facial cellulitis in the setting of sickle cell pain crisis.  Hospital Course:  Sickle cell disease with pain crisis: Patient was admitted for sickle cell pain crisis and managed appropriately with IVF, IV Dilaudid via PCA and IV Toradol, as well as other adjunct therapies per sickle  cell pain management protocols.  IV Dilaudid PCA weaned appropriately throughout admission.  Patient transition to home medication regimen.  He will resume oxycodone 30 mg every 6 hours as needed for severe pain.  Also, resume folic acid 1 mg daily, and hydroxyurea.  Facial cellulitis: Patient continues to have mild edema  and tenderness.  On exam he has multiple dental caries, very poor dentition.  Recommend that patient follows up with dentist.  He may warrant multiple extractions going forward.  Patient was treated with IV clindamycin throughout admission.  Will complete treatment with clindamycin over the next 6 days.  Discussed medication and potential side effects at length.  Patient expressed understanding.  Patient is requesting a first available appointment to discuss getting a Port-A-Cath.  Advised to contact PCPs office to set up an appointment.  Also, patient to follow-up in 2 weeks for labs including CBC and CMP.  He is alert, oriented, and ambulating without assistance. Patient was discharged home today in a hemodynamically stable condition.   Discharge Exam: Vitals:   01/24/19 1257 01/24/19 1404  BP:  103/71  Pulse:  89  Resp: 14 18  Temp:  98.8 F (37.1 C)  SpO2: 96% 96%   Vitals:   01/24/19 0446 01/24/19 0943 01/24/19 1257 01/24/19 1404  BP: (!) 94/56 111/71  103/71  Pulse: 79 95  89  Resp: 17 18 14 18   Temp: 98.9 F (37.2 C) 98.4 F (36.9 C)  98.8 F (37.1 C)  TempSrc: Oral Oral  Oral  SpO2: 95% 96% 96% 96%   Physical Exam HENT:     Nose: Nose normal.     Mouth/Throat:     Dentition: Abnormal dentition. Gingival swelling and dental caries present.  Cardiovascular:     Heart sounds: Murmur present.  Abdominal:     General: Bowel sounds are normal.  Musculoskeletal:        General: Normal range of motion.  Skin:    General: Skin is warm.  Neurological:     General: No focal deficit present.     Mental Status: He is alert. Mental status is at baseline.     Discharge Instructions  Discharge Instructions    Discharge patient   Complete by: As directed    Discharge disposition: 01-Home or Self Care   Discharge patient date: 01/24/2019     Allergies as of 01/24/2019      Reactions   Ceftriaxone Shortness Of Breath, Itching, Other (See Comments), Cough   Zosyn  [piperacillin Sod-tazobactam So] Shortness Of Breath   SOB/rash/N/V Has patient had a PCN reaction causing immediate rash, facial/tongue/throat swelling, SOB or lightheadedness with hypotension: Y Has patient had a PCN reaction causing severe rash involving mucus membranes or skin necrosis: N Has patient had a PCN reaction that required hospitalization: Y Has patient had a PCN reaction occurring within the last 10 years: Y If all of the above answers are "NO", then may proceed with Cephalosporin use.   Azithromycin Itching      Medication List    TAKE these medications   clindamycin 300 MG capsule Commonly known as: CLEOCIN Take 1 capsule (300 mg total) by mouth 3 (three) times daily for 6 days.   folic acid 1 MG tablet Commonly known as: FOLVITE Take 1 tablet (1 mg total) by mouth daily.   gabapentin 300 MG capsule Commonly known as: NEURONTIN Take 600 mg by mouth 3 (three) times daily.   hydroxyurea 500 MG capsule Commonly known as: HYDREA Take  2 capsules (1,000 mg total) by mouth daily. May take with food to minimize GI side effects.   ondansetron 4 MG disintegrating tablet Commonly known as: Zofran ODT Take 1 tablet (4 mg total) by mouth every 8 (eight) hours as needed.   oxycodone 30 MG immediate release tablet Commonly known as: ROXICODONE Take 1 tablet (30 mg total) by mouth every 4 (four) hours as needed for up to 15 days for pain.   promethazine 12.5 MG tablet Commonly known as: PHENERGAN Take 1 tablet (12.5 mg total) by mouth every 6 (six) hours as needed for nausea or vomiting.   sodium chloride irrigation 0.9 % irrigation Irrigate with 1 application as directed 2 (two) times daily.       The results of significant diagnostics from this hospitalization (including imaging, microbiology, ancillary and laboratory) are listed below for reference.    Significant Diagnostic Studies: DG Chest 1 View  Result Date: 01/24/2019 CLINICAL DATA:  Chest pain EXAM:  CHEST  1 VIEW COMPARISON:  01/21/2019 FINDINGS: Cardiomediastinal contours are stable. Increasing basilar opacities bilaterally. No signs of dense consolidation or evidence of pleural effusion. Gastric distension is noted in the upper abdomen. H-shaped vertebral bodies are redemonstrated. IMPRESSION: 1. Increasing basilar opacities bilaterally. May represent developing pneumonia superimposed on chronic interstitial changes. 2. Gastric distension in the upper abdomen. Electronically Signed   By: Zetta Bills M.D.   On: 01/24/2019 21:08   DG Chest 2 View  Result Date: 01/09/2019 CLINICAL DATA:  Chest pain EXAM: CHEST - 2 VIEW COMPARISON:  September 04, 2018 FINDINGS: Scarring at the left greater than right lung bases. No new consolidation or edema. No pleural effusion or pneumothorax. Stable cardiomediastinal contours. Dextrocurvature of the thoracic spine with endplate changes reflecting sickle cell disease. Cholecystectomy. IMPRESSION: No active cardiopulmonary disease. Electronically Signed   By: Macy Mis M.D.   On: 01/09/2019 09:45   DG Chest Portable 1 View  Result Date: 01/21/2019 CLINICAL DATA:  Chest pain. History of sickle cell. EXAM: PORTABLE CHEST 1 VIEW COMPARISON:  01/20/2019 FINDINGS: Cardiomediastinal contours are stable with cardiac enlargement. Lung volumes are low. No signs of dense opacification or evidence of pleural effusion. Rightward curvature of the spine is again noted with H-shaped vertebral bodies. No signs of acute bone process. Hazy opacity with increased interstitial markings has increased at the right lung base since the previous study. IMPRESSION: 1. Low lung volumes with cardiac enlargement. 2. Increased interstitial markings at the right lung base some of which may be chronic. Developing pneumonia is also considered. Electronically Signed   By: Zetta Bills M.D.   On: 01/21/2019 11:24   DG Chest Portable 1 View  Result Date: 01/20/2019 CLINICAL DATA:  Sickle cell  pain. EXAM: PORTABLE CHEST 1 VIEW COMPARISON:  Chest x-rays dated 01/09/2019 and 09/28/2018 and CT scan of the chest dated 10/06/2016 FINDINGS: The heart size and pulmonary vascularity are normal. Lungs are clear except for slight chronic accentuation of the interstitial markings at the right lung base. IMPRESSION: No acute abnormalities. Electronically Signed   By: Lorriane Shire M.D.   On: 01/20/2019 19:45    Microbiology: Recent Results (from the past 240 hour(s))  SARS CORONAVIRUS 2 (TAT 6-24 HRS) Nasopharyngeal Nasopharyngeal Swab     Status: None   Collection Time: 01/21/19  5:45 PM   Specimen: Nasopharyngeal Swab  Result Value Ref Range Status   SARS Coronavirus 2 NEGATIVE NEGATIVE Final    Comment: (NOTE) SARS-CoV-2 target nucleic acids are NOT DETECTED. The  SARS-CoV-2 RNA is generally detectable in upper and lower respiratory specimens during the acute phase of infection. Negative results do not preclude SARS-CoV-2 infection, do not rule out co-infections with other pathogens, and should not be used as the sole basis for treatment or other patient management decisions. Negative results must be combined with clinical observations, patient history, and epidemiological information. The expected result is Negative. Fact Sheet for Patients: HairSlick.no Fact Sheet for Healthcare Providers: quierodirigir.com This test is not yet approved or cleared by the [place] FDA and  has been authorized for detection and/or diagnosis of SARS-CoV-2 by FDA under an Emergency Use Authorization (EUA). This EUA will remain  in effect (meaning this test can be used) for the duration of the COVID-19 declaration under Section 56 4(b)(1) of the Act, 21 U.S.C. section 360bbb-3(b)(1), unless the authorization is terminated or revoked sooner. Performed at [facility], [address] [place], [place] [address]   Urine culture     Status:  None   Collection Time: 01/22/19  7:59 AM   Specimen: Urine, Random  Result Value Ref Range Status   Specimen Description   Final    URINE, RANDOM Performed at [facility], [address] [place], [place] [address]    Special Requests   Final    NONE Performed at [facility], [address], [place], [place] [address]    Culture   Final    NO GROWTH Performed at [facility], [address] [place], [place] [address]    Report Status 01/24/2019 FINAL  Final  Respiratory Panel by RT PCR (Flu A&B, Covid) - Nasopharyngeal Swab     Status: None   Collection Time: 01/24/19 11:17 PM   Specimen: Nasopharyngeal Swab  Result Value Ref Range Status   SARS Coronavirus 2 by RT PCR NEGATIVE NEGATIVE Final    Comment: (NOTE) SARS-CoV-2 target nucleic acids are NOT DETECTED. The SARS-CoV-2 RNA is generally detectable in upper respiratoy specimens during the acute phase of infection. The lowest concentration of SARS-CoV-2 viral copies this assay can detect is 131 copies/mL. A negative result does not preclude SARS-Cov-2 infection and should not be used as the sole basis for treatment or other patient management decisions. A negative result may occur with  improper specimen collection/handling, submission of specimen other than nasopharyngeal swab, presence of viral mutation(s) within the areas targeted by this assay, and inadequate number of viral copies (<131 copies/mL). A negative result must be combined with clinical observations, patient history, and epidemiological information. The expected result is Negative. Fact Sheet for Patients:  https://www.moore.com/ Fact Sheet for Healthcare Providers:  https://www.young.biz/ This test is not yet ap proved or cleared by the [place] FDA and  has been authorized for detection and/or diagnosis of SARS-CoV-2 by FDA under an Emergency Use Authorization  (EUA). This EUA will remain  in effect (meaning this test can be used) for the duration of the COVID-19 declaration under Section 564(b)(1) of the Act, 21 U.S.C. section 360bbb-3(b)(1), unless the authorization is terminated or revoked sooner.    Influenza A by PCR NEGATIVE NEGATIVE Final   Influenza B by PCR NEGATIVE NEGATIVE Final    Comment: (NOTE) The Xpert Xpress SARS-CoV-2/FLU/RSV assay is intended as an aid in  the diagnosis of influenza from Nasopharyngeal swab specimens and  should not be used as a sole basis for treatment. Nasal washings and  aspirates are unacceptable for Xpert Xpress SARS-CoV-2/FLU/RSV  testing. Fact Sheet for  Patients: https://www.moore.com/https://www.fda.gov/media/142436/download Fact Sheet for Healthcare Providers: https://www.young.biz/https://www.fda.gov/media/142435/download This test is not yet approved or cleared by the [place] FDA and  has been authorized for detection and/or diagnosis of SARS-CoV-2 by  FDA under an Emergency Use Authorization (EUA). This EUA will remain  in effect (meaning this test can be used) for the duration of the  Covid-19 declaration under Section 564(b)(1) of the Act, 21  U.S.C. section 360bbb-3(b)(1), unless the authorization is  terminated or revoked. Performed at [facility], [address] [place], [place] [address]      Labs: Basic Metabolic Panel: Recent Labs  Lab 01/21/19 1153 01/22/19 0759 01/24/19 2119  NA 139 134* 139  K 3.3* 3.8 4.0  CL 106 108 103  CO2 25 28 28   GLUCOSE 86 85 105*  BUN <5* 7 9  CREATININE 0.53* 0.51* 0.54*  CALCIUM 8.9 8.5* 9.1   Liver Function Tests: Recent Labs  Lab 01/21/19 1153 01/24/19 2119  AST 21 42*  ALT 13 34  ALKPHOS 57 83  BILITOT 3.0* 4.2*  PROT 7.1 7.4  ALBUMIN 3.6 3.8   Recent Labs  Lab 01/21/19 1153  LIPASE 17   No results for input(s): AMMONIA in the last 168 hours. CBC: Recent Labs  Lab 01/21/19 1153 01/22/19 0759 01/23/19 1315 01/24/19 0922 01/24/19 2119  WBC  17.8* 13.0* 15.1* 18.0* 18.1*  NEUTROABS 8.9*  --   --   --  12.4*  HGB 7.8* 7.6* 7.1* 7.7* 7.4*  HCT 22.6* 22.5* 21.4* 23.2* 22.1*  MCV 96.6 98.3 100.0 101.8* 98.7  PLT 262 245 268 295 305   Cardiac Enzymes: No results for input(s): CKTOTAL, CKMB, CKMBINDEX, TROPONINI in the last 168 hours. BNP: Invalid input(s): POCBNP CBG: No results for input(s): GLUCAP in the last 168 hours.  Time coordinating discharge: 50 minutes  Signed:  Nolon NationsLachina Moore Sharonann Malbrough  APRN, MSN, FNP-C [facility] [address]  [place], [place] [id number] [phone]   [facility] 01/25/2019, 4:04 PM

## 2019-01-24 NOTE — ED Triage Notes (Signed)
Pt presents to ED via [facility] with c/o back pain. Pt with hx of sickle cell anemia. Per EMS pt rates pain 10/10. Per EMS pt attempting to drive to [facility] when he pulled over and called EMS to bring him to the ED.

## 2019-01-24 NOTE — Discharge Instructions (Signed)
Sickle Cell Anemia, Adult  Sickle cell anemia is a condition where your red blood cells are shaped like sickles. Red blood cells carry oxygen through the body. Sickle-shaped cells do not live as long as normal red blood cells. They also clump together and block blood from flowing through the blood vessels. This prevents the body from getting enough oxygen. Sickle cell anemia causes organ damage and pain. It also increases the risk of infection. Follow these instructions at home: Medicines  Take over-the-counter and prescription medicines only as told by your doctor.  If you were prescribed an antibiotic medicine, take it as told by your doctor. Do not stop taking the antibiotic even if you start to feel better.  If you develop a fever, do not take medicines to lower the fever right away. Tell your doctor about the fever. Managing pain, stiffness, and swelling  Try these methods to help with pain: ? Use a heating pad. ? Take a warm bath. ? Distract yourself, such as by watching TV. Eating and drinking  Drink enough fluid to keep your pee (urine) clear or pale yellow. Drink more in hot weather and during exercise.  Limit or avoid alcohol.  Eat a healthy diet. Eat plenty of fruits, vegetables, whole grains, and lean protein.  Take vitamins and supplements as told by your doctor. Traveling  When traveling, keep these with you: ? Your medical information. ? The names of your doctors. ? Your medicines.  If you need to take an airplane, talk to your doctor first. Activity  Rest often.  Avoid exercises that make your heart beat much faster, such as jogging. General instructions  Do not use products that have nicotine or tobacco, such as cigarettes and e-cigarettes. If you need help quitting, ask your doctor.  Consider wearing a medical alert bracelet.  Avoid being in high places (high altitudes), such as mountains.  Avoid very hot or cold temperatures.  Avoid places where the  temperature changes a lot.  Keep all follow-up visits as told by your doctor. This is important. Contact a doctor if:  A joint hurts.  Your feet or hands hurt or swell.  You feel tired (fatigued). Get help right away if:  You have symptoms of infection. These include: ? Fever. ? Chills. ? Being very tired. ? Irritability. ? Poor eating. ? Throwing up (vomiting).  You feel dizzy or faint.  You have new stomach pain, especially on the left side.  You have a an erection (priapism) that lasts more than 4 hours.  You have numbness in your arms or legs.  You have a hard time moving your arms or legs.  You have trouble talking.  You have pain that does not go away when you take medicine.  You are short of breath.  You are breathing fast.  You have a long-term cough.  You have pain in your chest.  You have a bad headache.  You have a stiff neck.  Your stomach looks bloated even though you did not eat much.  Your skin is pale.  You suddenly cannot see well. Summary  Sickle cell anemia is a condition where your red blood cells are shaped like sickles.  Follow your doctor's advice on ways to manage pain, food to eat, activities to do, and steps to take for safe travel.  Get medical help right away if you have any signs of infection, such as a fever. This information is not intended to replace advice given to you by   your health care provider. Make sure you discuss any questions you have with your health care provider. Document Released: 11/16/2012 Document Revised: 05/20/2018 Document Reviewed: 03/03/2016 Elsevier Patient Education  2020 [employer] Clindamycin capsules What is this medicine? CLINDAMYCIN (KLIN da MYE sin) is a lincosamide antibiotic. It is used to treat certain kinds of bacterial infections. It will not work for colds, flu, or other viral infections. This medicine may be used for other purposes; ask your health care provider or pharmacist if you  have questions. COMMON BRAND NAME(S): Cleocin What should I tell my health care provider before I take this medicine? They need to know if you have any of these conditions:  kidney disease  liver disease  stomach problems like colitis  an unusual or allergic reaction to clindamycin, lincomycin, or other medicines, foods, dyes like tartrazine or preservatives  pregnant or trying to get pregnant  breast-feeding How should I use this medicine? Take this medicine by mouth with a full glass of water. Follow the directions on the prescription label. You can take this medicine with food or on an empty stomach. If the medicine upsets your stomach, take it with food. Take your medicine at regular intervals. Do not take your medicine more often than directed. Take all of your medicine as directed even if you think your are better. Do not skip doses or stop your medicine early. Talk to your pediatrician regarding the use of this medicine in children. Special care may be needed. Overdosage: If you think you have taken too much of this medicine contact a poison control center or emergency room at once. NOTE: This medicine is only for you. Do not share this medicine with others. What if I miss a dose? If you miss a dose, take it as soon as you can. If it is almost time for your next dose, take only that dose. Do not take double or extra doses. What may interact with this medicine?  birth control pills  erythromycin  medicines that relax muscles for surgery  rifampin This list may not describe all possible interactions. Give your health care provider a list of all the medicines, herbs, non-prescription drugs, or dietary supplements you use. Also tell them if you smoke, drink alcohol, or use illegal drugs. Some items may interact with your medicine. What should I watch for while using this medicine? Tell your doctor or health care provider if your symptoms do not start to get better or if they get  worse. This medicine may cause serious skin reactions. They can happen weeks to months after starting the medicine. Contact your health care provider right away if you notice fevers or flu-like symptoms with a rash. The rash may be red or purple and then turn into blisters or peeling of the skin. Or, you might notice a red rash with swelling of the face, lips or lymph nodes in your neck or under your arms. Do not treat diarrhea with over the counter products. Contact your doctor if you have diarrhea that lasts more than 2 days or if it is severe and watery. What side effects may I notice from receiving this medicine? Side effects that you should report to your doctor or health care professional as soon as possible:  allergic reactions like skin rash, itching or hives, swelling of the face, lips, or tongue  dark urine  pain on swallowing  rash, fever, and swollen lymph nodes  redness, blistering, peeling or loosening of the skin, including inside the  mouth  unusual bleeding or bruising  unusually weak or tired  yellowing of eyes or skin Side effects that usually do not require medical attention (report to your doctor or health care professional if they continue or are bothersome):  diarrhea  itching in the rectal or genital area  joint pain  nausea, vomiting  stomach pain This list may not describe all possible side effects. Call your doctor for medical advice about side effects. You may report side effects to FDA at 1-800-FDA-1088. Where should I keep my medicine? Keep out of the reach of children. Store at room temperature between 20 and 25 degrees C (68 and 77 degrees F). Throw away any unused medicine after the expiration date. NOTE: This sheet is a summary. It may not cover all possible information. If you have questions about this medicine, talk to your doctor, pharmacist, or health care provider.  2020 Elsevier/Gold Standard (2018-04-28 12:02:12)
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
## 2019-01-25 ENCOUNTER — Encounter (HOSPITAL_COMMUNITY): Payer: Self-pay | Admitting: Internal Medicine

## 2019-01-25 ENCOUNTER — Inpatient Hospital Stay (HOSPITAL_COMMUNITY)
Admission: EM | Admit: 2019-01-25 | Discharge: 2019-01-29 | DRG: 812 | Disposition: A | Discharge status: Home / Self Care | Payer: Medicaid Other | Source: Other Acute Inpatient Hospital | Attending: Internal Medicine | Admitting: Internal Medicine

## 2019-01-25 DIAGNOSIS — Z716 Tobacco abuse counseling: Secondary | ICD-10-CM | POA: Diagnosis not present

## 2019-01-25 DIAGNOSIS — F17210 Nicotine dependence, cigarettes, uncomplicated: Secondary | ICD-10-CM | POA: Diagnosis not present

## 2019-01-25 DIAGNOSIS — Z9049 Acquired absence of other specified parts of digestive tract: Secondary | ICD-10-CM

## 2019-01-25 DIAGNOSIS — Z881 Allergy status to other antibiotic agents status: Secondary | ICD-10-CM | POA: Diagnosis not present

## 2019-01-25 DIAGNOSIS — Z79891 Long term (current) use of opiate analgesic: Secondary | ICD-10-CM | POA: Diagnosis not present

## 2019-01-25 DIAGNOSIS — Z20828 Contact with and (suspected) exposure to other viral communicable diseases: Secondary | ICD-10-CM | POA: Diagnosis present

## 2019-01-25 DIAGNOSIS — F17209 Nicotine dependence, unspecified, with unspecified nicotine-induced disorders: Secondary | ICD-10-CM | POA: Diagnosis present

## 2019-01-25 DIAGNOSIS — G8929 Other chronic pain: Secondary | ICD-10-CM | POA: Diagnosis present

## 2019-01-25 DIAGNOSIS — D571 Sickle-cell disease without crisis: Secondary | ICD-10-CM

## 2019-01-25 DIAGNOSIS — Z886 Allergy status to analgesic agent status: Secondary | ICD-10-CM

## 2019-01-25 DIAGNOSIS — G894 Chronic pain syndrome: Secondary | ICD-10-CM | POA: Diagnosis not present

## 2019-01-25 DIAGNOSIS — D5700 Hb-SS disease with crisis, unspecified: Secondary | ICD-10-CM | POA: Diagnosis present

## 2019-01-25 DIAGNOSIS — F913 Oppositional defiant disorder: Secondary | ICD-10-CM | POA: Diagnosis not present

## 2019-01-25 DIAGNOSIS — Z888 Allergy status to other drugs, medicaments and biological substances status: Secondary | ICD-10-CM

## 2019-01-25 DIAGNOSIS — Z79899 Other long term (current) drug therapy: Secondary | ICD-10-CM

## 2019-01-25 DIAGNOSIS — D638 Anemia in other chronic diseases classified elsewhere: Secondary | ICD-10-CM | POA: Diagnosis present

## 2019-01-25 DIAGNOSIS — D649 Anemia, unspecified: Secondary | ICD-10-CM | POA: Diagnosis present

## 2019-01-25 LAB — COMPREHENSIVE METABOLIC PANEL
ALT: 52 U/L — ABNORMAL HIGH (ref 0–44)
AST: 58 U/L — ABNORMAL HIGH (ref 15–41)
Albumin: 3.7 g/dL (ref 3.5–5.0)
Alkaline Phosphatase: 82 U/L (ref 38–126)
Anion gap: 9 (ref 5–15)
BUN: 6 mg/dL (ref 6–20)
CO2: 29 mmol/L (ref 22–32)
Calcium: 9.1 mg/dL (ref 8.9–10.3)
Chloride: 104 mmol/L (ref 98–111)
Creatinine, Ser: 0.5 mg/dL — ABNORMAL LOW (ref 0.61–1.24)
GFR calc Af Amer: >60 mL/min (ref >60)
GFR calc non Af Amer: >60 mL/min (ref >60)
Glucose, Bld: 94 mg/dL (ref 70–99)
Potassium: 4.6 mmol/L (ref 3.5–5.1)
Sodium: 142 mmol/L (ref 135–145)
Total Bilirubin: 5 mg/dL — ABNORMAL HIGH (ref 0.3–1.2)
Total Protein: 7.2 g/dL (ref 6.5–8.1)

## 2019-01-25 LAB — CBC WITH DIFFERENTIAL/PLATELET
Abs Immature Granulocytes: 0.3 10*3/uL — ABNORMAL HIGH (ref 0.00–0.07)
Basophils Absolute: 0.1 10*3/uL (ref 0.0–0.1)
Basophils Relative: 0 %
Eosinophils Absolute: 0.7 10*3/uL — ABNORMAL HIGH (ref 0.0–0.5)
Eosinophils Relative: 5 %
HCT: 24.3 % — ABNORMAL LOW (ref 39.0–52.0)
Hemoglobin: 7.9 g/dL — ABNORMAL LOW (ref 13.0–17.0)
Immature Granulocytes: 2 %
Lymphocytes Relative: 25 %
Lymphs Abs: 3.4 10*3/uL (ref 0.7–4.0)
MCH: 33.6 pg (ref 26.0–34.0)
MCHC: 32.5 g/dL (ref 30.0–36.0)
MCV: 103.4 fL — ABNORMAL HIGH (ref 80.0–100.0)
Monocytes Absolute: 1.4 10*3/uL — ABNORMAL HIGH (ref 0.1–1.0)
Monocytes Relative: 10 %
Neutro Abs: 7.8 10*3/uL — ABNORMAL HIGH (ref 1.7–7.7)
Neutrophils Relative %: 58 %
Platelets: 253 10*3/uL (ref 150–400)
RBC: 2.35 MIL/uL — ABNORMAL LOW (ref 4.22–5.81)
RDW: 21.1 % — ABNORMAL HIGH (ref 11.5–15.5)
WBC: 13.7 10*3/uL — ABNORMAL HIGH (ref 4.0–10.5)
nRBC: 3.9 % — ABNORMAL HIGH (ref 0.0–0.2)

## 2019-01-25 LAB — RESPIRATORY PANEL BY RT PCR (FLU A&B, COVID)
Influenza A by PCR: NEGATIVE
Influenza B by PCR: NEGATIVE
SARS Coronavirus 2 by RT PCR: NEGATIVE

## 2019-01-25 MED ORDER — HYDROMORPHONE HCL 1 MG/ML IJ SOLN
2.0000 mg | Freq: Once | INTRAMUSCULAR | Status: AC
  Administered 2019-01-25: 02:00:00 2 mg via INTRAVENOUS
  Filled 2019-01-25: qty 2

## 2019-01-25 MED ORDER — PROMETHAZINE HCL 25 MG/ML IJ SOLN
12.5000 mg | Freq: Once | INTRAMUSCULAR | Status: AC
  Administered 2019-01-25: 02:00:00 12.5 mg via INTRAVENOUS
  Filled 2019-01-25: qty 1

## 2019-01-25 MED ORDER — SODIUM CHLORIDE 0.9% FLUSH: 9.0000 mL | INTRAVENOUS | Status: DC | PRN

## 2019-01-25 MED ORDER — HYDROMORPHONE HCL 1 MG/ML IJ SOLN
2.0000 mg | Freq: Once | INTRAMUSCULAR | Status: AC
  Administered 2019-01-25: 15:00:00 2 mg via INTRAVENOUS
  Filled 2019-01-25: qty 2

## 2019-01-25 MED ORDER — PROMETHAZINE HCL 25 MG/ML IJ SOLN
25.0000 mg | Freq: Once | INTRAMUSCULAR | Status: AC
  Administered 2019-01-25: 11:00:00 25 mg via INTRAVENOUS
  Filled 2019-01-25: qty 1

## 2019-01-25 MED ORDER — HYDROMORPHONE 1 MG/ML IV SOLN: INTRAVENOUS | Status: DC

## 2019-01-25 MED ORDER — ONDANSETRON HCL 4 MG/2ML IJ SOLN: 4.0000 mg | Freq: Four times a day (QID) | INTRAMUSCULAR | Status: DC | PRN

## 2019-01-25 MED ORDER — SENNOSIDES-DOCUSATE SODIUM 8.6-50 MG PO TABS
1.0000 | ORAL_TABLET | Freq: Two times a day (BID) | ORAL | Status: DC
  Administered 2019-01-28: 10:00:00 1 via ORAL
  Filled 2019-01-25 (×4): qty 1

## 2019-01-25 MED ORDER — NALOXONE HCL 0.4 MG/ML IJ SOLN: 0.4000 mg | INTRAMUSCULAR | Status: DC | PRN

## 2019-01-25 MED ORDER — POLYETHYLENE GLYCOL 3350 17 G PO PACK: 17.0000 g | PACK | Freq: Every day | ORAL | Status: DC | PRN

## 2019-01-25 MED ORDER — HYDROMORPHONE HCL 1 MG/ML IJ SOLN
1.0000 mg | INTRAMUSCULAR | Status: DC | PRN
  Administered 2019-01-25: 14:00:00 1 mg via INTRAVENOUS
  Filled 2019-01-25: qty 1

## 2019-01-25 MED ORDER — DEXTROSE-NACL 5-0.45 % IV SOLN: INTRAVENOUS | Status: DC

## 2019-01-25 MED ORDER — PROMETHAZINE HCL 25 MG/ML IJ SOLN
25.0000 mg | INTRAMUSCULAR | Status: DC | PRN
  Administered 2019-01-25: 16:00:00 25 mg via INTRAVENOUS
  Filled 2019-01-25: qty 1

## 2019-01-25 MED ORDER — HYDROMORPHONE HCL 1 MG/ML IJ SOLN
2.0000 mg | Freq: Once | INTRAMUSCULAR | Status: AC
  Administered 2019-01-25: 2 mg via INTRAVENOUS
  Filled 2019-01-25: qty 2

## 2019-01-25 MED ORDER — HYDROMORPHONE HCL 1 MG/ML IJ SOLN
1.0000 mg | Freq: Once | INTRAMUSCULAR | Status: AC
  Administered 2019-01-25: 08:00:00 1 mg via INTRAVENOUS
  Filled 2019-01-25: qty 1

## 2019-01-25 MED ORDER — ENOXAPARIN SODIUM 40 MG/0.4ML ~~LOC~~ SOLN
40.0000 mg | SUBCUTANEOUS | Status: DC
  Filled 2019-01-25 (×3): qty 0.4

## 2019-01-25 MED ORDER — HYDROMORPHONE HCL 1 MG/ML IJ SOLN
1.0000 mg | Freq: Once | INTRAMUSCULAR | Status: AC
  Administered 2019-01-25: 1 mg via INTRAVENOUS
  Filled 2019-01-25: qty 1

## 2019-01-25 MED ORDER — KETOROLAC TROMETHAMINE 30 MG/ML IJ SOLN
30.0000 mg | Freq: Four times a day (QID) | INTRAMUSCULAR | Status: DC
  Administered 2019-01-25 – 2019-01-29 (×16): 30 mg via INTRAVENOUS
  Filled 2019-01-25 (×16): qty 1

## 2019-01-25 MED ORDER — HYDROMORPHONE 1 MG/ML IV SOLN
INTRAVENOUS | Status: DC
  Administered 2019-01-26: 11 mg via INTRAVENOUS
  Administered 2019-01-26: 6 mg via INTRAVENOUS
  Filled 2019-01-25: qty 30

## 2019-01-25 MED ORDER — OXYCODONE HCL 5 MG PO TABS
15.0000 mg | ORAL_TABLET | Freq: Once | ORAL | Status: AC
  Administered 2019-01-25: 15 mg via ORAL
  Filled 2019-01-25: qty 3

## 2019-01-25 MED ORDER — SODIUM CHLORIDE 0.9 % IV SOLN: Freq: Once | INTRAVENOUS | Status: AC

## 2019-01-25 MED ORDER — ONDANSETRON HCL 4 MG/2ML IJ SOLN
4.0000 mg | Freq: Once | INTRAMUSCULAR | Status: AC
  Administered 2019-01-25: 4 mg via INTRAVENOUS
  Filled 2019-01-25: qty 2

## 2019-01-26 ENCOUNTER — Encounter (HOSPITAL_COMMUNITY): Payer: Self-pay | Admitting: Internal Medicine

## 2019-01-26 ENCOUNTER — Telehealth: Payer: Self-pay

## 2019-01-26 MED ORDER — DIPHENHYDRAMINE HCL 25 MG PO CAPS
25.0000 mg | ORAL_CAPSULE | Freq: Once | ORAL | Status: AC
  Administered 2019-01-26: 25 mg via ORAL
  Filled 2019-01-26: qty 1

## 2019-01-26 MED ORDER — HYDROMORPHONE 1 MG/ML IV SOLN
INTRAVENOUS | Status: DC
  Administered 2019-01-26: 30 mg via INTRAVENOUS
  Administered 2019-01-26: 9 mg via INTRAVENOUS
  Administered 2019-01-26: 12 mg via INTRAVENOUS
  Administered 2019-01-26: 30 mg via INTRAVENOUS
  Administered 2019-01-27: 3 mg via INTRAVENOUS
  Administered 2019-01-27: 8 mg via INTRAVENOUS
  Administered 2019-01-27: 3 mg via INTRAVENOUS
  Administered 2019-01-27: 4.5 mg via INTRAVENOUS
  Administered 2019-01-27: 4 mg via INTRAVENOUS
  Administered 2019-01-27 – 2019-01-28 (×2): 30 mg via INTRAVENOUS
  Administered 2019-01-28: 0 mg via INTRAVENOUS
  Administered 2019-01-28: 6.5 mg via INTRAVENOUS
  Administered 2019-01-28: 9 mg via INTRAVENOUS
  Administered 2019-01-28: 8 mg via INTRAVENOUS
  Administered 2019-01-28: 4 mg via INTRAVENOUS
  Administered 2019-01-28: 8.5 mg via INTRAVENOUS
  Administered 2019-01-29: 2.79 mg via INTRAVENOUS
  Administered 2019-01-29: 0.5 mg via INTRAVENOUS
  Administered 2019-01-29: 2.5 mg via INTRAVENOUS
  Administered 2019-01-29: 2.7 mg via INTRAVENOUS
  Administered 2019-01-29: 6 mg via INTRAVENOUS
  Filled 2019-01-26 (×4): qty 30

## 2019-01-26 MED ORDER — PROMETHAZINE HCL 25 MG PO TABS
12.5000 mg | ORAL_TABLET | ORAL | Status: DC | PRN
  Administered 2019-01-26 – 2019-01-27 (×3): 12.5 mg via ORAL
  Filled 2019-01-26 (×3): qty 1

## 2019-01-26 MED ORDER — PROMETHAZINE HCL 25 MG/ML IJ SOLN
12.5000 mg | Freq: Once | INTRAMUSCULAR | Status: AC
  Administered 2019-01-26: 03:00:00 12.5 mg via INTRAVENOUS
  Filled 2019-01-26: qty 1

## 2019-01-26 NOTE — Telephone Encounter (Signed)
Patient requesting refill: Folic Acid, Oxycodone 30 MG, Phenergan.  [facility] on [address] in [place].
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
## 2019-01-26 NOTE — Progress Notes (Signed)
Matthew Shannon a 28 year old male with a medical history significant for sickle ell disease, chronic pain syndrome, opiate dependence and tolerance, drug-seeking behavior, history of anemia of chronic disease, and history of polysubstance abuse was admitted for sickle cell pain crisis. Patient was just discharged on 01/24/2019 for this problem.  He says that pain returned shortly after arriving home.  Pain was uncontrolled by home medications.  He says that he was unable to keep medications down.  Patient says that he was taking Phenergan consistently for this problem.  Patient was previously admitted with bilateral facial cellulitis.  He has poor dentition.  He was treated with IV clindamycin and transition to oral prior to discharge.  Patient was advised to follow-up with dentist. His current pain intensity is 8/10.  Patient appears to be resting comfortably.  Care plan: Mostly agree with care plan.  Made the following changes: IV Dilaudid PCA with settings of 0.5 mg, 10-minute lockout, and 2 mg/h. Discontinue Toradol after 2 days, due to recent admission. Repeat CBC and CMP in a.m.      Donia Pounds  APRN, MSN, FNP-C Patient [facility] [address] [place], [place] [address] [phone]   This note was prepared using Dragon speech recognition software, errors in dictation are unintentional.

## 2019-01-27 DIAGNOSIS — D5700 Hb-SS disease with crisis, unspecified: Principal | ICD-10-CM

## 2019-01-27 DIAGNOSIS — D638 Anemia in other chronic diseases classified elsewhere: Secondary | ICD-10-CM

## 2019-01-27 LAB — CBC
HCT: 21.6 % — ABNORMAL LOW (ref 39.0–52.0)
Hemoglobin: 7.2 g/dL — ABNORMAL LOW (ref 13.0–17.0)
MCH: 34 pg (ref 26.0–34.0)
MCHC: 33.3 g/dL (ref 30.0–36.0)
MCV: 101.9 fL — ABNORMAL HIGH (ref 80.0–100.0)
Platelets: 309 10*3/uL (ref 150–400)
RBC: 2.12 MIL/uL — ABNORMAL LOW (ref 4.22–5.81)
RDW: 19.9 % — ABNORMAL HIGH (ref 11.5–15.5)
WBC: 10.3 10*3/uL (ref 4.0–10.5)
nRBC: 5.8 % — ABNORMAL HIGH (ref 0.0–0.2)

## 2019-01-27 MED ORDER — OXYCODONE HCL 5 MG PO TABS
20.0000 mg | ORAL_TABLET | Freq: Four times a day (QID) | ORAL | Status: DC | PRN
  Administered 2019-01-27 – 2019-01-29 (×7): 20 mg via ORAL
  Filled 2019-01-27 (×7): qty 4

## 2019-01-27 MED ORDER — SODIUM CHLORIDE 0.9% FLUSH: 10.0000 mL | INTRAVENOUS | Status: DC | PRN

## 2019-01-27 NOTE — Progress Notes (Signed)
Subjective: Matthew HarrisonBobby Shannon, a 28 year old male with a medical history significant for sickle cell disease, chronic pain syndrome, opiate dependence and tolerance, drug-seeking behavior, history of anemia of chronic disease, and history of polysubstance abuse was admitted for sickle cell pain crisis.  Patient states the pain is not improved on today.  Pain intensity is 8/10 primarily to low back and lower extremities.  Patient encouraged to mobilize. He denies headache, chest pain, shortness of breath, dizziness, paresthesias, urinary symptoms, nausea, vomiting, or diarrhea.  Objective:  Vital signs in last 24 hours:  Vitals:   01/27/19 0801 01/27/19 0935 01/27/19 1240 01/27/19 1343  BP: 102/65  118/64   Pulse: 65  71   Resp: 14 12 12 12   Temp: 98.1 F (36.7 C)  98.5 F (36.9 C)   TempSrc: Oral  Oral   SpO2: 100% 98% 98% 100%  Weight:      Height:        Intake/Output from previous day:   Intake/Output Summary (Last 24 hours) at 01/27/2019 1705 Last data filed at 01/27/2019 0926 Gross per 24 hour  Intake 600 ml  Output 2175 ml  Net -1575 ml    Physical Exam: General: Alert, awake, oriented x3, in no acute distress.  HEENT: [place]/AT PEERL, EOMI Neck: Trachea midline,  no masses, no thyromegal,y no JVD, no carotid bruit OROPHARYNX:  Moist, No exudate/ erythema/lesions.  Heart: Regular rate and rhythm, without murmurs, rubs, gallops, PMI non-displaced, no heaves or thrills on palpation.  Lungs: Clear to auscultation, no wheezing or rhonchi noted. No increased vocal fremitus resonant to percussion  Abdomen: Soft, nontender, nondistended, positive bowel sounds, no masses no hepatosplenomegaly noted..  Neuro: No focal neurological deficits noted cranial nerves II through XII grossly intact. DTRs 2+ bilaterally upper and lower extremities. Strength 5 out of 5 in bilateral upper and lower extremities. Musculoskeletal: No warm swelling or erythema around joints, no spinal tenderness  noted. Psychiatric: Patient alert and oriented x3, good insight and cognition, good recent to remote recall. Lymph node survey: No cervical axillary or inguinal lymphadenopathy noted.  Lab Results:  Basic Metabolic Panel:    Component Value Date/Time   NA 142 01/25/2019 1915   NA 137 11/29/2018 1224   K 4.6 01/25/2019 1915   CL 104 01/25/2019 1915   CO2 29 01/25/2019 1915   BUN 6 01/25/2019 1915   BUN 5 (L) 11/29/2018 1224   CREATININE 0.50 (L) 01/25/2019 1915   GLUCOSE 94 01/25/2019 1915   CALCIUM 9.1 01/25/2019 1915   CBC:    Component Value Date/Time   WBC 10.3 01/27/2019 0805   HGB 7.2 (L) 01/27/2019 0805   HGB 8.1 (L) 11/29/2018 1224   HCT 21.6 (L) 01/27/2019 0805   HCT 24.6 (L) 11/29/2018 1224   PLT 309 01/27/2019 0805   PLT 330 11/29/2018 1224   MCV 101.9 (H) 01/27/2019 0805   MCV 104 (H) 11/29/2018 1224   NEUTROABS 7.8 (H) 01/25/2019 1915   NEUTROABS 6.4 11/29/2018 1224   LYMPHSABS 3.4 01/25/2019 1915   LYMPHSABS 3.9 (H) 11/29/2018 1224   MONOABS 1.4 (H) 01/25/2019 1915   EOSABS 0.7 (H) 01/25/2019 1915   EOSABS 0.3 11/29/2018 1224   BASOSABS 0.1 01/25/2019 1915   BASOSABS 0.1 11/29/2018 1224    Recent Results (from the past 240 hour(s))  SARS CORONAVIRUS 2 (TAT 6-24 HRS) Nasopharyngeal Nasopharyngeal Swab     Status: None   Collection Time: 01/21/19  5:45 PM   Specimen: Nasopharyngeal Swab  Result Value Ref  Range Status   SARS Coronavirus 2 NEGATIVE NEGATIVE Final    Comment: (NOTE) SARS-CoV-2 target nucleic acids are NOT DETECTED. The SARS-CoV-2 RNA is generally detectable in upper and lower respiratory specimens during the acute phase of infection. Negative results do not preclude SARS-CoV-2 infection, do not rule out co-infections with other pathogens, and should not be used as the sole basis for treatment or other patient management decisions. Negative results must be combined with clinical observations, patient history, and epidemiological  information. The expected result is Negative. Fact Sheet for Patients: HairSlick.no Fact Sheet for Healthcare Providers: quierodirigir.com This test is not yet approved or cleared by the [place] FDA and  has been authorized for detection and/or diagnosis of SARS-CoV-2 by FDA under an Emergency Use Authorization (EUA). This EUA will remain  in effect (meaning this test can be used) for the duration of the COVID-19 declaration under Section 56 4(b)(1) of the Act, 21 U.S.C. section 360bbb-3(b)(1), unless the authorization is terminated or revoked sooner. Performed at [facility], [address], [place], [place] [address]   Urine culture     Status: None   Collection Time: 01/22/19  7:59 AM   Specimen: Urine, Random  Result Value Ref Range Status   Specimen Description   Final    URINE, RANDOM Performed at [facility], [address], [place], [place] [address]    Special Requests   Final    NONE Performed at [facility], [address], [place], [place] [address]    Culture   Final    NO GROWTH Performed at [facility], [address], [place], [place] [address]    Report Status 01/24/2019 FINAL  Final  Respiratory Panel by RT PCR (Flu A&B, Covid) - Nasopharyngeal Swab     Status: None   Collection Time: 01/24/19 11:17 PM   Specimen: Nasopharyngeal Swab  Result Value Ref Range Status   SARS Coronavirus 2 by RT PCR NEGATIVE NEGATIVE Final    Comment: (NOTE) SARS-CoV-2 target nucleic acids are NOT DETECTED. The SARS-CoV-2 RNA is generally detectable in upper respiratoy specimens during the acute phase of infection. The lowest concentration of SARS-CoV-2 viral copies this assay can detect is 131 copies/mL. A negative result does not preclude SARS-Cov-2 infection and should not be used as the sole basis for treatment or other patient management decisions. A negative  result may occur with  improper specimen collection/handling, submission of specimen other than nasopharyngeal swab, presence of viral mutation(s) within the areas targeted by this assay, and inadequate number of viral copies (<131 copies/mL). A negative result must be combined with clinical observations, patient history, and epidemiological information. The expected result is Negative. Fact Sheet for Patients:  https://www.moore.com/ Fact Sheet for Healthcare Providers:  https://www.young.biz/ This test is not yet ap proved or cleared by the [place] FDA and  has been authorized for detection and/or diagnosis of SARS-CoV-2 by FDA under an Emergency Use Authorization (EUA). This EUA will remain  in effect (meaning this test can be used) for the duration of the COVID-19 declaration under Section 564(b)(1) of the Act, 21 U.S.C. section 360bbb-3(b)(1), unless the authorization is terminated or revoked sooner.    Influenza A by PCR NEGATIVE NEGATIVE Final   Influenza B by PCR NEGATIVE NEGATIVE Final    Comment: (NOTE) The Xpert Xpress SARS-CoV-2/FLU/RSV assay is intended as an aid in  the diagnosis of influenza from Nasopharyngeal swab specimens and  should not be  used as a sole basis for treatment. Nasal washings and  aspirates are unacceptable for Xpert Xpress SARS-CoV-2/FLU/RSV  testing. Fact Sheet for Patients: PinkCheek.be Fact Sheet for Healthcare Providers: GravelBags.it This test is not yet approved or cleared by the [place] FDA and  has been authorized for detection and/or diagnosis of SARS-CoV-2 by  FDA under an Emergency Use Authorization (EUA). This EUA will remain  in effect (meaning this test can be used) for the duration of the  Covid-19 declaration under Section 564(b)(1) of the Act, 21  U.S.C. section 360bbb-3(b)(1), unless the authorization is  terminated or  revoked. Performed at [facility], [address] [place], [place] [address]     Studies/Results: No results found.  Medications: Scheduled Meds: . enoxaparin (LOVENOX) injection  40 mg Subcutaneous Q24H  . HYDROmorphone   Intravenous Q4H  . ketorolac  30 mg Intravenous Q6H  . senna-docusate  1 tablet Oral BID   Continuous Infusions: . dextrose 5 % and 0.45% NaCl 50 mL/hr at 01/26/19 0843   PRN Meds:.polyethylene glycol, promethazine, sodium chloride flush  Consultants:  None  Procedures:  None  Antibiotics:  None  Assessment/Plan: Principal Problem:   Sickle-cell crisis (HCC) Active Problems:   Anemia  Sickle cell disease with pain crisis:  Patient continues to complain of increased pain.  No changes in PCA settings on today. Oxycodone 20 mg every 6 hours as needed Continue IV fluids at [facility] Tylenol 650 mg every 6 hours as needed Continue to monitor vital signs closely and reevaluate pain intensity regularly  Chronic pain syndrome: Continue home medications  Sickle cell anemia: Hemoglobin 7.2, consistent with patient's baseline of 7.0-8.0 g/dL.  There is no clinical indication for blood transfusion at this time.  Continue to monitor closely.  History of polysubstance abuse: Most recent drug screen negative for illicit drugs.  Tobacco dependence: Patient counseled at length, not ready to quit.   Code Status: Full Code Family Communication: N/A Disposition Plan: Not yet ready for discharge   [place], MSN, FNP-C Patient [facility] [address] [place], [place] [address] [phone]  If 7PM-7AM, please contact night-coverage.  01/27/2019, 5:05 PM  LOS: 2 days

## 2019-01-27 NOTE — TOC Initial Note (Signed)
Transition of Care [facility]) - Initial/Assessment Note    Patient Details  Name: Matthew Shannon MRN: [id number] Date of Birth: 1990/09/09  Transition of Care [facility]) CM/SW Contact:    Matthew Mage, LCSW Phone Number: 01/27/2019, 8:09 AM  Clinical Narrative:   Matthew Shannon, in sickle cell crises, was seen due to high risk for readmission.  He lives in the [place] area with his fiance and their 2 children, boys ages 61 and 42.  He has been getting SSI for years, as well as Medicaid.  The sickle cell clinic next door to [facility] is his home clinic, and he goes there for medical help unless their schedule or his illness forces him to seek help at an ED or urgent care.  He has transportation, and states that his medications are all affordable. He denies any specific needs.  Originally from [place], he has lived in this area since 2016, and has 2 brothers who also suffer from the same illness. TOC will continue to follow during the course of hospitalization.               Expected Discharge Plan: Home/Self Care Barriers to Discharge: No Barriers Identified   Patient Goals and CMS Choice Patient states their goals for this hospitalization and ongoing recovery are:: "I just deal with it one day at a time."      Expected Discharge Plan and Services Expected Discharge Plan: Home/Self Care   Discharge Planning Services: CM Consult   Living arrangements for the past 2 months: Apartment                                      Prior Living Arrangements/Services Living arrangements for the past 2 months: Apartment Lives with:: Minor Children, Significant Other Patient language and need for interpreter reviewed:: Yes        Need for Family Participation in Patient Care: Yes (Comment) Care giver support system in place?: Yes (comment)   Criminal Activity/Legal Involvement Pertinent to Current Situation/Hospitalization: No - Comment as needed  Activities of Daily Living Home Assistive  Devices/Equipment: None ADL Screening (condition at time of admission) Patient's cognitive ability adequate to safely complete daily activities?: Yes Is the patient deaf or have difficulty hearing?: No Does the patient have difficulty seeing, even when wearing glasses/contacts?: No Does the patient have difficulty concentrating, remembering, or making decisions?: No Patient able to express need for assistance with ADLs?: Yes Does the patient have difficulty dressing or bathing?: Yes Independently performs ADLs?: Yes (appropriate for developmental age) Does the patient have difficulty walking or climbing stairs?: No Weakness of Legs: Both Weakness of Arms/Hands: Both  Permission Sought/Granted                  Emotional Assessment Appearance:: Appears stated age Attitude/Demeanor/Rapport: Engaged Affect (typically observed): Appropriate Orientation: : Oriented to Self, Oriented to Place, Oriented to  Time, Oriented to Situation Alcohol / Substance Use: Not Applicable Psych Involvement: No (comment)  Admission diagnosis:  Sickle-cell crisis ([place]) [D57.00] Patient Active Problem List   Diagnosis Date Noted  . Sickle-cell crisis ([place]) 01/25/2019  . Facial cellulitis 01/21/2019  . Nausea and vomiting 01/21/2019  . Cough   . Physical tolerance to opiate drug   . Sickle cell pain crisis ([place]) 08/31/2018  . Polysubstance abuse ([place])   . Tobacco use disorder, continuous 07/24/2015  . Sickle cell anemia with crisis ([place]) 07/09/2015  .  Exercise hypoxemia 06/14/2015  . Chronic pain 05/21/2015  . EKG abnormalities   . Eczema   . Leukocytosis 01/24/2015  . Anemia 10/09/2014  . Tobacco abuse 09/24/2014   PCP:  Matthew Angst, MD Pharmacy:   [facility] [phone] - [place] - [address], [address] AT [facility] [address] [place] [address] Phone: [phone] Fax: [phone]  [facility] -  [place], [place] - [address] [place] [address] [place] [address] Phone: [phone] Fax: [phone]     Social Determinants of Health (SDOH) Interventions    Readmission Risk Interventions Readmission Risk Prevention Plan 09/15/2018  Transportation Screening Complete  Medication Review (RN Care Manager) Complete  PCP or Specialist appointment within 3-5 days of discharge Not Complete  PCP/Specialist Appt Not Complete comments DC date unknown- pt is established at clinic  [facility] or Home Care Consult Not Complete  HRI or Home Care Consult Pt Refusal Comments no indication  SW Recovery Care/Counseling Consult Not Complete  SW Consult Not Complete Comments no indication  Palliative Care Screening Not Applicable  Skilled Nursing Facility Not Applicable  Some recent data might be hidden

## 2019-01-28 MED ORDER — HYDROMORPHONE HCL 1 MG/ML IJ SOLN: 1.0000 mg | Freq: Once | INTRAMUSCULAR | Status: AC

## 2019-01-28 MED ORDER — HYDROMORPHONE BOLUS VIA INFUSION
1.0000 mg | Freq: Once | INTRAVENOUS | Status: DC
  Administered 2019-01-28: 1 mg via INTRAVENOUS

## 2019-01-28 MED ORDER — DIPHENHYDRAMINE HCL 25 MG PO CAPS
25.0000 mg | ORAL_CAPSULE | Freq: Every evening | ORAL | Status: DC | PRN
  Administered 2019-01-28: 25 mg via ORAL
  Filled 2019-01-28: qty 1

## 2019-01-28 NOTE — Progress Notes (Signed)
Subjective: A 28 year old gentleman with sickle cell disease here with sickle cell painful crisis.  Patient complaining of 9 out of 10 pain in his right leg and lower back.  He has been hemodynamically stable.  He denied fever or chills.  Denied nausea vomiting or diarrhea.  Still on Dilaudid PCA.  Allergic to Toradol so no Toradol.  Also on his oral long-acting medications.  Objective: Vital signs in last 24 hours: Temp:  [98.1 F (36.7 C)-98.7 F (37.1 C)] 98.7 F (37.1 C) (12/19 1156) Pulse Rate:  [67-83] 78 (12/19 1156) Resp:  [9-20] 9 (12/19 1217) BP: (101-129)/(67-83) 112/72 (12/19 1156) SpO2:  [92 %-100 %] 100 % (12/19 1217) Weight change:  Last BM Date: 01/26/19  Intake/Output from previous day: 12/18 0701 - 12/19 0700 In: 720 [P.O.:720] Out: 1875 [Urine:1875] Intake/Output this shift: No intake/output data recorded.  General appearance: alert, appears stated age, distracted and mild distress Head: Normocephalic, without obvious abnormality, atraumatic Resp: clear to auscultation bilaterally Cardio: regular rate and rhythm, S1, S2 normal, no murmur, click, rub or gallop GI: soft, non-tender; bowel sounds normal; no masses,  no organomegaly Extremities: extremities normal, atraumatic, no cyanosis or edema Pulses: 2+ and symmetric Skin: Skin color, texture, turgor normal. No rashes or lesions Neurologic: Grossly normal  Lab Results: Recent Labs    01/25/19 1915 01/27/19 0805  WBC 13.7* 10.3  HGB 7.9* 7.2*  HCT 24.3* 21.6*  PLT 253 309   BMET Recent Labs    01/25/19 1915  NA 142  K 4.6  CL 104  CO2 29  GLUCOSE 94  BUN 6  CREATININE 0.50*  CALCIUM 9.1    Studies/Results: No results found.  Medications: I have reviewed the patient's current medications.  Assessment/Plan: A 28 year old with sickle cell painful crisis.  #1 sickle cell painful crisis: Patient has Dilaudid PCA with physician assistant dosing.  Also his long-acting pain medications.  I  will continue with this regimen as well as IV fluids.  Encourage patient to use heating pads on the legs.  #2 sickle cell anemia: Hemoglobin is at 7.2 which is baseline.  Continue monitoring.  #3 chronic pain syndrome: Continue long-acting pain medications.  #4 oppositional defiant disorder: Patient maintained on his home regimen and counseling been provided.  LOS: 3 days   Matthew Shannon,LAWAL 01/28/2019, 1:39 PM

## 2019-01-28 NOTE — Progress Notes (Signed)
The pt had a very restless night, due to increase generalized  pain, which was unrelieved by his PCA IV medication, prn po meds given with no change. Paged on call NP, no new orders written. I will continue to monitor.

## 2019-01-29 LAB — CBC WITH DIFFERENTIAL/PLATELET
Abs Immature Granulocytes: 0.12 10*3/uL — ABNORMAL HIGH (ref 0.00–0.07)
Basophils Absolute: 0 10*3/uL (ref 0.0–0.1)
Basophils Relative: 0 %
Eosinophils Absolute: 0.9 10*3/uL — ABNORMAL HIGH (ref 0.0–0.5)
Eosinophils Relative: 7 %
HCT: 24.2 % — ABNORMAL LOW (ref 39.0–52.0)
Hemoglobin: 8 g/dL — ABNORMAL LOW (ref 13.0–17.0)
Immature Granulocytes: 1 %
Lymphocytes Relative: 24 %
Lymphs Abs: 3 10*3/uL (ref 0.7–4.0)
MCH: 33.1 pg (ref 26.0–34.0)
MCHC: 33.1 g/dL (ref 30.0–36.0)
MCV: 100 fL (ref 80.0–100.0)
Monocytes Absolute: 1.4 10*3/uL — ABNORMAL HIGH (ref 0.1–1.0)
Monocytes Relative: 11 %
Neutro Abs: 6.9 10*3/uL (ref 1.7–7.7)
Neutrophils Relative %: 57 %
Platelets: 355 10*3/uL (ref 150–400)
RBC: 2.42 MIL/uL — ABNORMAL LOW (ref 4.22–5.81)
RDW: 18.4 % — ABNORMAL HIGH (ref 11.5–15.5)
WBC: 12.3 10*3/uL — ABNORMAL HIGH (ref 4.0–10.5)
nRBC: 1.1 % — ABNORMAL HIGH (ref 0.0–0.2)

## 2019-01-29 LAB — COMPREHENSIVE METABOLIC PANEL
ALT: 50 U/L — ABNORMAL HIGH (ref 0–44)
AST: 49 U/L — ABNORMAL HIGH (ref 15–41)
Albumin: 3.9 g/dL (ref 3.5–5.0)
Alkaline Phosphatase: 89 U/L (ref 38–126)
Anion gap: 6 (ref 5–15)
BUN: 9 mg/dL (ref 6–20)
CO2: 32 mmol/L (ref 22–32)
Calcium: 9.3 mg/dL (ref 8.9–10.3)
Chloride: 101 mmol/L (ref 98–111)
Creatinine, Ser: 0.48 mg/dL — ABNORMAL LOW (ref 0.61–1.24)
GFR calc Af Amer: >60 mL/min (ref >60)
GFR calc non Af Amer: >60 mL/min (ref >60)
Glucose, Bld: 76 mg/dL (ref 70–99)
Potassium: 4.9 mmol/L (ref 3.5–5.1)
Sodium: 139 mmol/L (ref 135–145)
Total Bilirubin: 3.8 mg/dL — ABNORMAL HIGH (ref 0.3–1.2)
Total Protein: 7.5 g/dL (ref 6.5–8.1)

## 2019-01-29 MED ORDER — OXYCODONE HCL 30 MG PO TABS: 30.0000 mg | ORAL_TABLET | ORAL | 0 refills | Status: DC | PRN

## 2019-01-29 NOTE — Progress Notes (Signed)
Pt leaving this afternoon with "a ride". Alert and oriented. Discharge instructions given.explained with pt verbalizing understanding.

## 2019-01-29 NOTE — Discharge Summary (Signed)
Physician Discharge Summary  Patient ID: Matthew Shannon MRN: [id number] DOB/AGE: 10-Mar-1990 28 y.o.  Admit date: 01/25/2019 Discharge date: 01/29/2019  Admission Diagnoses:  Discharge Diagnoses:  Principal Problem:   Sickle-cell crisis [facility]) Active Problems:   Anemia   Chronic pain   Tobacco use disorder, continuous   Discharged Condition: good  Hospital Course: Patient admitted with sickle cell painful crisis.  Pain was 10 out of 10.  Treated with IV Dilaudid PCA, Toradol and IV fluids.  Patient responded to treatment.  He was having pain at 10 out of 10 initially but down to 4 out of 10.  He has chronic anemia and and also chronic pain syndrome which were both addressed.  At time of discharge patient appears to have returned to baseline.  He is to continue his home regimen.  Patient also to follow-up with PCP.  He has tobacco abuse for which he did have tobacco cessation counseling.  Consults: None  Significant Diagnostic Studies: labs: Serial CBCs and CMP's check.  No transfusion.  Treatments: IV hydration  Discharge Exam: Blood pressure 110/69, pulse 76, temperature 98.8 F (37.1 C), temperature source Oral, resp. rate 12, height 5\' 10"  (1.778 m), weight 61 kg, SpO2 99 %. General appearance: alert, cooperative, appears stated age and no distress Back: symmetric, no curvature. ROM normal. No CVA tenderness. Resp: clear to auscultation bilaterally Cardio: regular rate and rhythm, S1, S2 normal, no murmur, click, rub or gallop GI: soft, non-tender; bowel sounds normal; no masses,  no organomegaly Pulses: 2+ and symmetric Skin: Skin color, texture, turgor normal. No rashes or lesions Neurologic: Grossly normal  Disposition: Discharge disposition: 01-Home or Self Care       Discharge Instructions    Diet - low sodium heart healthy   Complete by: As directed    Increase activity slowly   Complete by: As directed      Allergies as of 01/29/2019      Reactions   Ceftriaxone Shortness Of Breath, Itching, Other (See Comments), Cough   Zosyn [piperacillin Sod-tazobactam So] Shortness Of Breath   SOB/rash/N/V Has patient had a PCN reaction causing immediate rash, facial/tongue/throat swelling, SOB or lightheadedness with hypotension: Y Has patient had a PCN reaction causing severe rash involving mucus membranes or skin necrosis: N Has patient had a PCN reaction that required hospitalization: Y Has patient had a PCN reaction occurring within the last 10 years: Y If all of the above answers are "NO", then may proceed with Cephalosporin use.   Azithromycin Itching      Medication List    TAKE these medications   clindamycin 300 MG capsule Commonly known as: CLEOCIN Take 1 capsule (300 mg total) by mouth 3 (three) times daily for 6 days.   folic acid 1 MG tablet Commonly known as: FOLVITE Take 1 tablet (1 mg total) by mouth daily.   gabapentin 300 MG capsule Commonly known as: NEURONTIN Take 600 mg by mouth 3 (three) times daily.   hydroxyurea 500 MG capsule Commonly known as: HYDREA Take 2 capsules (1,000 mg total) by mouth daily. May take with food to minimize GI side effects.   ondansetron 4 MG disintegrating tablet Commonly known as: Zofran ODT Take 1 tablet (4 mg total) by mouth every 8 (eight) hours as needed. What changed: reasons to take this   oxycodone 30 MG immediate release tablet Commonly known as: ROXICODONE Take 1 tablet (30 mg total) by mouth every 4 (four) hours as needed for up to 15 days for  pain.   promethazine 12.5 MG tablet Commonly known as: PHENERGAN Take 1 tablet (12.5 mg total) by mouth every 6 (six) hours as needed for nausea or vomiting.        SignedLonia Blood 01/29/2019, 11:05 AM   Time spent 34 minutes

## 2019-02-04 ENCOUNTER — Emergency Department (HOSPITAL_COMMUNITY): Payer: Medicaid Other

## 2019-02-04 ENCOUNTER — Other Ambulatory Visit: Payer: Self-pay

## 2019-02-04 ENCOUNTER — Encounter (HOSPITAL_COMMUNITY): Payer: Self-pay | Admitting: Emergency Medicine

## 2019-02-04 ENCOUNTER — Emergency Department (HOSPITAL_COMMUNITY)
Admission: EM | Admit: 2019-02-04 | Discharge: 2019-02-04 | Discharge status: Against Medical Advice | Payer: Medicaid Other | Attending: Emergency Medicine | Admitting: Emergency Medicine

## 2019-02-04 ENCOUNTER — Encounter (HOSPITAL_COMMUNITY): Payer: Self-pay

## 2019-02-04 ENCOUNTER — Emergency Department (HOSPITAL_COMMUNITY)
Admission: EM | Admit: 2019-02-04 | Discharge: 2019-02-05 | Disposition: A | Discharge status: Home / Self Care | Payer: Medicaid Other | Source: Home / Self Care | Attending: Emergency Medicine | Admitting: Emergency Medicine

## 2019-02-04 ENCOUNTER — Emergency Department (HOSPITAL_COMMUNITY)
Admission: EM | Admit: 2019-02-04 | Discharge: 2019-02-04 | Disposition: A | Discharge status: Home / Self Care | Payer: Medicaid Other | Source: Home / Self Care | Attending: Emergency Medicine | Admitting: Emergency Medicine

## 2019-02-04 DIAGNOSIS — G894 Chronic pain syndrome: Secondary | ICD-10-CM

## 2019-02-04 DIAGNOSIS — Z79899 Other long term (current) drug therapy: Secondary | ICD-10-CM | POA: Insufficient documentation

## 2019-02-04 DIAGNOSIS — F17210 Nicotine dependence, cigarettes, uncomplicated: Secondary | ICD-10-CM | POA: Insufficient documentation

## 2019-02-04 DIAGNOSIS — D649 Anemia, unspecified: Secondary | ICD-10-CM | POA: Insufficient documentation

## 2019-02-04 DIAGNOSIS — R112 Nausea with vomiting, unspecified: Secondary | ICD-10-CM

## 2019-02-04 DIAGNOSIS — Z5320 Procedure and treatment not carried out because of patient's decision for unspecified reasons: Secondary | ICD-10-CM | POA: Insufficient documentation

## 2019-02-04 DIAGNOSIS — D5700 Hb-SS disease with crisis, unspecified: Secondary | ICD-10-CM

## 2019-02-04 LAB — COMPREHENSIVE METABOLIC PANEL
ALT: 27 U/L (ref 0–44)
AST: 27 U/L (ref 15–41)
Albumin: 4 g/dL (ref 3.5–5.0)
Alkaline Phosphatase: 83 U/L (ref 38–126)
Anion gap: 10 (ref 5–15)
BUN: 10 mg/dL (ref 6–20)
CO2: 23 mmol/L (ref 22–32)
Calcium: 9.2 mg/dL (ref 8.9–10.3)
Chloride: 100 mmol/L (ref 98–111)
Creatinine, Ser: 0.55 mg/dL — ABNORMAL LOW (ref 0.61–1.24)
GFR calc Af Amer: >60 mL/min (ref >60)
GFR calc non Af Amer: >60 mL/min (ref >60)
Glucose, Bld: 98 mg/dL (ref 70–99)
Potassium: 4.1 mmol/L (ref 3.5–5.1)
Sodium: 133 mmol/L — ABNORMAL LOW (ref 135–145)
Total Bilirubin: 4.2 mg/dL — ABNORMAL HIGH (ref 0.3–1.2)
Total Protein: 8.3 g/dL — ABNORMAL HIGH (ref 6.5–8.1)

## 2019-02-04 LAB — CBC WITH DIFFERENTIAL/PLATELET
Abs Immature Granulocytes: 0.27 10*3/uL — ABNORMAL HIGH (ref 0.00–0.07)
Basophils Absolute: 0.1 10*3/uL (ref 0.0–0.1)
Basophils Relative: 1 %
Eosinophils Absolute: 0.5 10*3/uL (ref 0.0–0.5)
Eosinophils Relative: 3 %
HCT: 20 % — ABNORMAL LOW (ref 39.0–52.0)
Hemoglobin: 6.8 g/dL — CL (ref 13.0–17.0)
Immature Granulocytes: 1 %
Lymphocytes Relative: 22 %
Lymphs Abs: 4.4 10*3/uL — ABNORMAL HIGH (ref 0.7–4.0)
MCH: 32.9 pg (ref 26.0–34.0)
MCHC: 34 g/dL (ref 30.0–36.0)
MCV: 96.6 fL (ref 80.0–100.0)
Monocytes Absolute: 2.6 10*3/uL — ABNORMAL HIGH (ref 0.1–1.0)
Monocytes Relative: 13 %
Neutro Abs: 12.3 10*3/uL — ABNORMAL HIGH (ref 1.7–7.7)
Neutrophils Relative %: 60 %
Platelets: 397 10*3/uL (ref 150–400)
RBC: 2.07 MIL/uL — ABNORMAL LOW (ref 4.22–5.81)
RDW: 17.6 % — ABNORMAL HIGH (ref 11.5–15.5)
WBC: 20.3 10*3/uL — ABNORMAL HIGH (ref 4.0–10.5)
nRBC: 0.5 % — ABNORMAL HIGH (ref 0.0–0.2)

## 2019-02-04 LAB — RETICULOCYTES
Immature Retic Fract: 35.4 % — ABNORMAL HIGH (ref 2.3–15.9)
RBC.: 2.06 MIL/uL — ABNORMAL LOW (ref 4.22–5.81)
Retic Count, Absolute: 305.3 10*3/uL — ABNORMAL HIGH (ref 19.0–186.0)
Retic Ct Pct: 14.8 % — ABNORMAL HIGH (ref 0.4–3.1)

## 2019-02-04 LAB — TROPONIN I (HIGH SENSITIVITY): Troponin I (High Sensitivity): 4 ng/L (ref <18)

## 2019-02-04 MED ORDER — DIPHENHYDRAMINE HCL 50 MG/ML IJ SOLN: 50.0000 mg | Freq: Once | INTRAMUSCULAR | Status: DC

## 2019-02-04 MED ORDER — ONDANSETRON 4 MG PO TBDP
4.0000 mg | ORAL_TABLET | Freq: Once | ORAL | Status: AC
  Administered 2019-02-04: 09:00:00 4 mg via ORAL
  Filled 2019-02-04: qty 1

## 2019-02-04 MED ORDER — SODIUM CHLORIDE 0.9% FLUSH: 3.0000 mL | Freq: Once | INTRAVENOUS | Status: DC

## 2019-02-04 MED ORDER — PROMETHAZINE HCL 25 MG/ML IJ SOLN
25.0000 mg | Freq: Once | INTRAMUSCULAR | Status: AC
  Administered 2019-02-04: 25 mg via INTRAVENOUS
  Filled 2019-02-04: qty 1

## 2019-02-04 MED ORDER — KETOROLAC TROMETHAMINE 30 MG/ML IJ SOLN
30.0000 mg | INTRAMUSCULAR | Status: DC
  Filled 2019-02-04: qty 1

## 2019-02-04 MED ORDER — KETOROLAC TROMETHAMINE 30 MG/ML IJ SOLN
30.0000 mg | Freq: Once | INTRAMUSCULAR | Status: AC
  Administered 2019-02-04: 30 mg via INTRAMUSCULAR
  Filled 2019-02-04: qty 1

## 2019-02-04 MED ORDER — PROMETHAZINE HCL 25 MG PO TABS: 25.0000 mg | ORAL_TABLET | Freq: Once | ORAL | Status: DC

## 2019-02-04 MED ORDER — HYDROMORPHONE HCL 1 MG/ML IJ SOLN
2.0000 mg | Freq: Once | INTRAMUSCULAR | Status: AC
  Administered 2019-02-04: 2 mg via INTRAVENOUS
  Filled 2019-02-04 (×2): qty 2

## 2019-02-04 MED ORDER — SODIUM CHLORIDE 0.45 % IV SOLN: INTRAVENOUS | Status: DC

## 2019-02-04 MED ORDER — ONDANSETRON HCL 4 MG/2ML IJ SOLN: 4.0000 mg | INTRAMUSCULAR | Status: DC | PRN

## 2019-02-04 MED ORDER — OXYCODONE HCL 5 MG PO TABS
30.0000 mg | ORAL_TABLET | Freq: Once | ORAL | Status: AC
  Administered 2019-02-04: 30 mg via ORAL
  Filled 2019-02-04: qty 6

## 2019-02-04 MED ORDER — HYDROMORPHONE HCL 1 MG/ML IJ SOLN: 1.0000 mg | Freq: Once | INTRAMUSCULAR | Status: DC

## 2019-02-04 MED ORDER — DIPHENHYDRAMINE HCL 50 MG/ML IJ SOLN
12.5000 mg | Freq: Once | INTRAMUSCULAR | Status: AC
  Administered 2019-02-04: 20:00:00 12.5 mg via INTRAVENOUS
  Filled 2019-02-04: qty 1

## 2019-02-04 MED ORDER — HYDROMORPHONE HCL 2 MG/ML IJ SOLN
2.0000 mg | INTRAMUSCULAR | Status: AC
  Administered 2019-02-04: 2 mg via SUBCUTANEOUS
  Filled 2019-02-04: qty 1

## 2019-02-04 MED ORDER — DEXTROSE-NACL 5-0.45 % IV SOLN: INTRAVENOUS | Status: DC

## 2019-02-04 MED ORDER — HYDROMORPHONE HCL 2 MG/ML IJ SOLN
2.0000 mg | INTRAMUSCULAR | Status: AC
  Administered 2019-02-04: 09:00:00 2 mg via SUBCUTANEOUS
  Filled 2019-02-04: qty 1

## 2019-02-04 MED ORDER — KETOROLAC TROMETHAMINE 30 MG/ML IJ SOLN
30.0000 mg | INTRAMUSCULAR | Status: AC
  Administered 2019-02-04: 30 mg via INTRAVENOUS
  Filled 2019-02-04: qty 1

## 2019-02-04 NOTE — Progress Notes (Signed)
Pt's previous PIV insertion site on LUA unremarkable--- pt's denied pain to site; stated, "It's not that swollen anymore".  Pressure dressing to affected site maintained.

## 2019-02-04 NOTE — ED Notes (Signed)
Patient refuses  Blood work stated to long of wait he is leaving

## 2019-02-04 NOTE — Discharge Instructions (Signed)
Please go to the sickle cell clinic on Monday for PCA infusion Take Oxycodone 30mg  every 4 hours for pain and increase fluid intake

## 2019-02-04 NOTE — ED Triage Notes (Signed)
Patient is complaining of sickle cell crisis, vomiting, and nauseated. Patient states he can not keep anything down. Patient was at [facility] 02/04/19 around 0945. Patient had labs done then. Patient was going to be admitted but got mad and left.

## 2019-02-04 NOTE — Progress Notes (Signed)
Attempted L brachial vein for PIV with ultrasound.  Hit nerves, per pt felt tingling to fingertips and pt jumped significantly, needle possibly nicked artery.  Small amount of swelling noted at site, pressure held for 10 minutes, pressure dressing applied.  RN notified.  No further bleeding at site noted. Pt not c/o tingling in arm once catheter removed.

## 2019-02-04 NOTE — ED Notes (Signed)
IV team at bedside 

## 2019-02-04 NOTE — ED Notes (Signed)
IV team unsuccessful- states they will send another team to assess and attempt an IV on this patient

## 2019-02-04 NOTE — ED Triage Notes (Signed)
Patient arrived by [facility] for ongoing sickle cell pain. Was going to be admitted at [facility] today and did not like that he was going to get po phenergan and left AMA. Walked to gas station and called EMS again

## 2019-02-04 NOTE — ED Triage Notes (Addendum)
Pt arriving with complaint of sickle cell pain "all over". Pt also reports nausea and vomiting. Pt seen at [facility] at approx 5am today for same, where he left AMA due to wait time.
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
## 2019-02-04 NOTE — ED Notes (Signed)
Patient Alert and oriented to baseline. Stable and ambulatory to baseline. Patient verbalized understanding of the discharge instructions.  Patient belongings were taken by the patient.   

## 2019-02-04 NOTE — ED Notes (Signed)
Unsuccessful IV attempt x2.  

## 2019-02-04 NOTE — ED Notes (Signed)
Patient transported to X-ray 

## 2019-02-04 NOTE — ED Triage Notes (Signed)
Pt c/o generalized pain, worse in chest. Hx sickle cell anemia.

## 2019-02-04 NOTE — ED Notes (Signed)
Patient vomited all over the floor. Patient did not call for help or ask for emesis bag.
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
## 2019-02-05 MED ORDER — PROMETHAZINE HCL 25 MG/ML IJ SOLN
25.0000 mg | INTRAMUSCULAR | Status: AC
  Administered 2019-02-05: 25 mg via INTRAMUSCULAR
  Filled 2019-02-05: qty 1

## 2019-02-05 MED ORDER — HYDROMORPHONE HCL 2 MG/ML IJ SOLN
2.0000 mg | Freq: Once | INTRAMUSCULAR | Status: AC
  Administered 2019-02-05: 02:00:00 2 mg via SUBCUTANEOUS
  Filled 2019-02-05: qty 1

## 2019-02-05 MED ORDER — OXYCODONE HCL 5 MG PO TABS
30.0000 mg | ORAL_TABLET | Freq: Once | ORAL | Status: AC
  Administered 2019-02-05: 03:00:00 30 mg via ORAL
  Filled 2019-02-05: qty 6

## 2019-02-05 MED ORDER — PROMETHAZINE HCL 12.5 MG PO TABS: 12.5000 mg | ORAL_TABLET | Freq: Four times a day (QID) | ORAL | 0 refills | Status: DC | PRN

## 2019-02-05 MED ORDER — DIPHENHYDRAMINE HCL 25 MG PO CAPS
50.0000 mg | ORAL_CAPSULE | Freq: Once | ORAL | Status: AC | PRN
  Administered 2019-02-05: 02:00:00 50 mg via ORAL
  Filled 2019-02-05: qty 2
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
## 2019-02-05 NOTE — Discharge Instructions (Signed)
Take Phenergan prescribed to you for management of any persistent nausea or vomiting.  Follow-up in the sickle cell clinic tomorrow for any ongoing pain.

## 2019-02-05 NOTE — ED Notes (Signed)
PT DISCHARGED. INSTRUCTIONS AND PRESCRIPTION GIVEN. AAOX4. PT IN NO APPARENT DISTRESS WITH CHRONIC PAIN. PT TOLERATED SANDWICH W/O ANY DIFFICULTIES. THE OPPORTUNITY TO ASK QUESTIONS WAS PROVIDED.

## 2019-02-05 NOTE — ED Notes (Signed)
Pt given food for PO challenge. He has had two sandwiches and three sprites thus far without issue.

## 2019-02-06 ENCOUNTER — Encounter (HOSPITAL_COMMUNITY): Payer: Medicaid Other

## 2019-02-08 ENCOUNTER — Telehealth: Payer: Self-pay | Admitting: Family Medicine

## 2019-02-08 ENCOUNTER — Other Ambulatory Visit: Payer: Self-pay | Admitting: Family Medicine

## 2019-02-08 DIAGNOSIS — G894 Chronic pain syndrome: Secondary | ICD-10-CM

## 2019-02-08 DIAGNOSIS — D571 Sickle-cell disease without crisis: Secondary | ICD-10-CM

## 2019-02-08 MED ORDER — OXYCODONE HCL 30 MG PO TABS: 30.0000 mg | ORAL_TABLET | ORAL | 0 refills | Status: DC | PRN

## 2019-02-08 NOTE — Progress Notes (Signed)
Reviewed PDMP substance reporting system prior to prescribing opiate medications. No inconsistencies noted.   Meds ordered this encounter  Medications  . oxycodone (ROXICODONE) 30 MG immediate release tablet    Sig: Take 1 tablet (30 mg total) by mouth every 4 (four) hours as needed for up to 15 days for pain.    Dispense:  90 tablet    Refill:  0    Rx is not to be filled prior to 02/14/2019. There are no exceptions.    Order Specific Question:   Supervising Provider    Answer:   JEGEDE, OLUGBEMIGA E [[id number]]     Matthew Kester Moore Torrance Frech  APRN, MSN, FNP-C Patient Care [facility] [address]  [place], [place] [address] [phone]  

## 2019-02-13 ENCOUNTER — Telehealth: Payer: Self-pay | Admitting: Family Medicine

## 2019-02-13 ENCOUNTER — Other Ambulatory Visit: Payer: Self-pay | Admitting: Family Medicine

## 2019-02-13 ENCOUNTER — Telehealth: Payer: Self-pay

## 2019-02-13 MED ORDER — PROMETHAZINE HCL 12.5 MG PO TABS: 12.5000 mg | ORAL_TABLET | Freq: Four times a day (QID) | ORAL | 2 refills | Status: DC | PRN

## 2019-02-13 NOTE — Telephone Encounter (Signed)
Sent to NP 

## 2019-02-13 NOTE — Progress Notes (Signed)
Meds ordered this encounter  ?Medications  ? promethazine (PHENERGAN) 12.5 MG tablet  ?  Sig: Take 1 tablet (12.5 mg total) by mouth every 6 (six) hours as needed for nausea or vomiting.  ?  Dispense:  30 tablet  ?  Refill:  2  ?  Order Specific Question:   Supervising Provider  ?  Answer:   JEGEDE, OLUGBEMIGA E [[id number]]  ?  ? ?Tanisia Yokley Moore Elida Harbin  APRN, MSN, FNP-C ?Patient Care Center ?[facility] ?[address]  ?[place], [place] [address] ?[phone] ? ?

## 2019-02-13 NOTE — Telephone Encounter (Signed)
Patient called and is asking for refill on phenergan. It looks like last sent in by ER physician, can this be refilled. Thanks!

## 2019-02-14 ENCOUNTER — Other Ambulatory Visit: Payer: Self-pay | Admitting: Family Medicine

## 2019-02-14 ENCOUNTER — Telehealth: Payer: Self-pay

## 2019-02-14 DIAGNOSIS — D571 Sickle-cell disease without crisis: Secondary | ICD-10-CM

## 2019-02-14 DIAGNOSIS — G894 Chronic pain syndrome: Secondary | ICD-10-CM

## 2019-02-14 MED ORDER — OXYCODONE HCL 30 MG PO TABS: 30.0000 mg | ORAL_TABLET | ORAL | 0 refills | Status: AC | PRN

## 2019-02-14 NOTE — Telephone Encounter (Signed)
error 

## 2019-02-14 NOTE — Telephone Encounter (Signed)
[facility] [address] does not [facility] enough ox[facility] 30mg  to fill RX Per "[facility]". Can this be sent to different pharmacy? [facility] [address] [facility] [place]? Please advise.

## 2019-02-14 NOTE — Progress Notes (Unsigned)
Reviewed PDMP substance reporting system prior to prescribing opiate medications. No inconsistencies noted.   Meds ordered this encounter  Medications  . oxycodone (ROXICODONE) 30 MG immediate release tablet    Sig: Take 1 tablet (30 mg total) by mouth every 4 (four) hours as needed for up to 15 days for pain.    Dispense:  90 tablet    Refill:  0    Rx is not to be filled prior to 02/14/2019. There are no exceptions.    Order Specific Question:   Supervising Provider    Answer:   [name] [[id number]]     [name]  APRN, MSN, FNP-C Patient Care [facility] [address] [place], [place] [address] [phone]

## 2019-02-17 ENCOUNTER — Other Ambulatory Visit: Payer: Self-pay

## 2019-02-17 ENCOUNTER — Emergency Department (HOSPITAL_COMMUNITY)
Admission: EM | Admit: 2019-02-17 | Discharge: 2019-02-18 | Disposition: A | Discharge status: Home / Self Care | Payer: Medicaid Other | Attending: Emergency Medicine | Admitting: Emergency Medicine

## 2019-02-17 ENCOUNTER — Encounter (HOSPITAL_COMMUNITY): Payer: Self-pay | Admitting: *Deleted

## 2019-02-17 DIAGNOSIS — M549 Dorsalgia, unspecified: Secondary | ICD-10-CM | POA: Diagnosis present

## 2019-02-17 DIAGNOSIS — Z79899 Other long term (current) drug therapy: Secondary | ICD-10-CM | POA: Diagnosis not present

## 2019-02-17 DIAGNOSIS — D57219 Sickle-cell/Hb-C disease with crisis, unspecified: Secondary | ICD-10-CM | POA: Diagnosis not present

## 2019-02-17 DIAGNOSIS — F17210 Nicotine dependence, cigarettes, uncomplicated: Secondary | ICD-10-CM | POA: Insufficient documentation

## 2019-02-17 DIAGNOSIS — D5700 Hb-SS disease with crisis, unspecified: Secondary | ICD-10-CM

## 2019-02-17 DIAGNOSIS — G894 Chronic pain syndrome: Secondary | ICD-10-CM | POA: Insufficient documentation

## 2019-02-17 DIAGNOSIS — F1410 Cocaine abuse, uncomplicated: Secondary | ICD-10-CM | POA: Diagnosis not present

## 2019-02-17 NOTE — ED Triage Notes (Signed)
Pt with onset of "sickle cell pain two days ago" reporting all over pain and unable to hold down his pain meds due to vomiting. Wants labs drawn once in treatment room.

## 2019-02-18 ENCOUNTER — Emergency Department (HOSPITAL_COMMUNITY): Payer: Medicaid Other

## 2019-02-18 ENCOUNTER — Emergency Department (HOSPITAL_COMMUNITY)
Admission: EM | Admit: 2019-02-18 | Discharge: 2019-02-18 | Disposition: A | Discharge status: Home / Self Care | Payer: Medicaid Other | Source: Home / Self Care | Attending: Emergency Medicine | Admitting: Emergency Medicine

## 2019-02-18 ENCOUNTER — Other Ambulatory Visit: Payer: Self-pay

## 2019-02-18 DIAGNOSIS — Z79899 Other long term (current) drug therapy: Secondary | ICD-10-CM | POA: Insufficient documentation

## 2019-02-18 DIAGNOSIS — G894 Chronic pain syndrome: Secondary | ICD-10-CM | POA: Insufficient documentation

## 2019-02-18 DIAGNOSIS — F17210 Nicotine dependence, cigarettes, uncomplicated: Secondary | ICD-10-CM | POA: Insufficient documentation

## 2019-02-18 DIAGNOSIS — F1410 Cocaine abuse, uncomplicated: Secondary | ICD-10-CM | POA: Insufficient documentation

## 2019-02-18 LAB — CBC WITH DIFFERENTIAL/PLATELET
Abs Immature Granulocytes: 0.14 10*3/uL — ABNORMAL HIGH (ref 0.00–0.07)
Basophils Absolute: 0.1 10*3/uL (ref 0.0–0.1)
Basophils Relative: 1 %
Eosinophils Absolute: 0.6 10*3/uL — ABNORMAL HIGH (ref 0.0–0.5)
Eosinophils Relative: 4 %
HCT: 20.8 % — ABNORMAL LOW (ref 39.0–52.0)
Hemoglobin: 7 g/dL — ABNORMAL LOW (ref 13.0–17.0)
Immature Granulocytes: 1 %
Lymphocytes Relative: 32 %
Lymphs Abs: 4.5 10*3/uL — ABNORMAL HIGH (ref 0.7–4.0)
MCH: 32.1 pg (ref 26.0–34.0)
MCHC: 33.7 g/dL (ref 30.0–36.0)
MCV: 95.4 fL (ref 80.0–100.0)
Monocytes Absolute: 1.8 10*3/uL — ABNORMAL HIGH (ref 0.1–1.0)
Monocytes Relative: 13 %
Neutro Abs: 6.9 10*3/uL (ref 1.7–7.7)
Neutrophils Relative %: 49 %
Platelets: 360 10*3/uL (ref 150–400)
RBC: 2.18 MIL/uL — ABNORMAL LOW (ref 4.22–5.81)
RDW: 17.2 % — ABNORMAL HIGH (ref 11.5–15.5)
WBC: 14 10*3/uL — ABNORMAL HIGH (ref 4.0–10.5)
nRBC: 0.4 % — ABNORMAL HIGH (ref 0.0–0.2)

## 2019-02-18 LAB — BASIC METABOLIC PANEL
Anion gap: 8 (ref 5–15)
BUN: 6 mg/dL (ref 6–20)
CO2: 23 mmol/L (ref 22–32)
Calcium: 8.4 mg/dL — ABNORMAL LOW (ref 8.9–10.3)
Chloride: 106 mmol/L (ref 98–111)
Creatinine, Ser: 0.64 mg/dL (ref 0.61–1.24)
GFR calc Af Amer: >60 mL/min (ref >60)
GFR calc non Af Amer: >60 mL/min (ref >60)
Glucose, Bld: 105 mg/dL — ABNORMAL HIGH (ref 70–99)
Potassium: 3.8 mmol/L (ref 3.5–5.1)
Sodium: 137 mmol/L (ref 135–145)

## 2019-02-18 MED ORDER — HYDROMORPHONE HCL 2 MG/ML IJ SOLN: 2.0000 mg | INTRAMUSCULAR | Status: AC

## 2019-02-18 MED ORDER — HYDROMORPHONE HCL 2 MG/ML IJ SOLN: 2.0000 mg | INTRAMUSCULAR | Status: DC

## 2019-02-18 MED ORDER — HYDROMORPHONE HCL 1 MG/ML IJ SOLN
2.0000 mg | INTRAMUSCULAR | Status: AC
  Administered 2019-02-18: 2 mg via INTRAVENOUS
  Filled 2019-02-18: qty 2

## 2019-02-18 MED ORDER — PROMETHAZINE HCL 25 MG PO TABS
25.0000 mg | ORAL_TABLET | Freq: Once | ORAL | Status: AC
  Administered 2019-02-18: 22:00:00 25 mg via ORAL
  Filled 2019-02-18: qty 1

## 2019-02-18 MED ORDER — AMOXICILLIN-POT CLAVULANATE 875-125 MG PO TABS: 1.0000 | ORAL_TABLET | Freq: Two times a day (BID) | ORAL | 0 refills | Status: DC

## 2019-02-18 MED ORDER — ONDANSETRON HCL 4 MG/2ML IJ SOLN
4.0000 mg | Freq: Once | INTRAMUSCULAR | Status: AC
  Administered 2019-02-18: 4 mg via INTRAVENOUS
  Filled 2019-02-18: qty 2

## 2019-02-18 MED ORDER — HYDROMORPHONE HCL 2 MG/ML IJ SOLN
2.0000 mg | INTRAMUSCULAR | Status: AC
  Administered 2019-02-18: 21:00:00 2 mg via SUBCUTANEOUS
  Filled 2019-02-18: qty 1

## 2019-02-18 MED ORDER — SODIUM CHLORIDE 0.9% FLUSH: 3.0000 mL | Freq: Once | INTRAVENOUS | Status: DC

## 2019-02-18 MED ORDER — ONDANSETRON HCL 4 MG/2ML IJ SOLN
4.0000 mg | INTRAMUSCULAR | Status: DC | PRN
  Administered 2019-02-18: 06:00:00 4 mg via INTRAVENOUS
  Filled 2019-02-18: qty 2

## 2019-02-18 MED ORDER — KETOROLAC TROMETHAMINE 30 MG/ML IJ SOLN
30.0000 mg | INTRAMUSCULAR | Status: AC
  Administered 2019-02-18: 30 mg via INTRAVENOUS
  Filled 2019-02-18: qty 1

## 2019-02-18 MED ORDER — HYDROMORPHONE HCL 1 MG/ML IJ SOLN
2.0000 mg | INTRAMUSCULAR | Status: AC
  Filled 2019-02-18: qty 2

## 2019-02-18 MED ORDER — HYDROMORPHONE HCL 1 MG/ML IJ SOLN: 2.0000 mg | INTRAMUSCULAR | Status: AC

## 2019-02-18 MED ORDER — KETOROLAC TROMETHAMINE 30 MG/ML IJ SOLN
30.0000 mg | INTRAMUSCULAR | Status: DC
  Filled 2019-02-18: qty 1

## 2019-02-18 MED ORDER — DIPHENHYDRAMINE HCL 50 MG/ML IJ SOLN
25.0000 mg | Freq: Once | INTRAMUSCULAR | Status: AC
  Administered 2019-02-18: 10:00:00 25 mg via INTRAVENOUS
  Filled 2019-02-18: qty 1

## 2019-02-18 MED ORDER — DIPHENHYDRAMINE HCL 25 MG PO CAPS
25.0000 mg | ORAL_CAPSULE | ORAL | Status: DC | PRN
  Administered 2019-02-18: 25 mg via ORAL
  Filled 2019-02-18: qty 1

## 2019-02-18 MED ORDER — HYDROMORPHONE HCL 1 MG/ML IJ SOLN
2.0000 mg | Freq: Once | INTRAMUSCULAR | Status: AC
  Administered 2019-02-18: 2 mg via INTRAVENOUS
  Filled 2019-02-18: qty 2

## 2019-02-18 MED ORDER — HYDROMORPHONE HCL 2 MG/ML IJ SOLN
2.0000 mg | INTRAMUSCULAR | Status: DC
  Administered 2019-02-18: 22:00:00 2 mg via SUBCUTANEOUS
  Filled 2019-02-18: qty 1

## 2019-02-18 MED ORDER — PROMETHAZINE HCL 25 MG PO TABS
25.0000 mg | ORAL_TABLET | Freq: Once | ORAL | Status: AC
  Administered 2019-02-18: 25 mg via ORAL
  Filled 2019-02-18: qty 1

## 2019-02-18 MED ORDER — HYDROMORPHONE HCL 1 MG/ML IJ SOLN
2.0000 mg | INTRAMUSCULAR | Status: AC
  Administered 2019-02-18 (×2): 2 mg via INTRAVENOUS

## 2019-02-18 MED ORDER — HYDROMORPHONE HCL 2 MG/ML IJ SOLN
2.0000 mg | INTRAMUSCULAR | Status: AC
  Administered 2019-02-18: 20:00:00 2 mg via SUBCUTANEOUS
  Filled 2019-02-18: qty 1

## 2019-02-18 MED ORDER — ONDANSETRON HCL 4 MG/2ML IJ SOLN
4.0000 mg | INTRAMUSCULAR | Status: DC | PRN
  Filled 2019-02-18: qty 2

## 2019-02-18 MED ORDER — DIPHENHYDRAMINE HCL 25 MG PO CAPS
25.0000 mg | ORAL_CAPSULE | ORAL | Status: DC | PRN
  Administered 2019-02-18: 06:00:00 25 mg via ORAL
  Filled 2019-02-18: qty 1

## 2019-02-18 NOTE — Discharge Instructions (Signed)
Please follow up with [name] in 2 days for further management of your condition.

## 2019-02-18 NOTE — ED Provider Notes (Signed)
  [facility] EMERGENCY DEPARTMENT Provider Note   29 year old male with sickle cell disease presents to ER with symptoms similar to prior sickle cell pain crises.  Labs stable.  Has received multiple doses of IV Dilaudid and requesting additional.  CXR with questionable infiltrate versus atelectasis in lung bases.  I discussed case with his primary doctor and sickle cell specialist, [name].  He feels patient can be safely discharged and managed as outpatient at this time.  Recommends starting on Augmentin to cover possible CAP. Though feels most likely atelectasis. overall picture not consistent with acute chest. Patient is tolerating p.o., stable vital signs, remains afebrile, will discharge home.  Patient is tolerating p.o.    After the discussed management above, the patient was determined to be safe for discharge.  The patient was in agreement with this plan and all questions regarding their care were answered.  ED return precautions were discussed and the patient will return to the ED with any significant worsening of condition.       Milagros Loll, MD 02/18/19 1201

## 2019-02-18 NOTE — ED Notes (Signed)
PA at bedside.

## 2019-02-18 NOTE — ED Notes (Signed)
Patient was given [place] sandwich and sprite

## 2019-02-18 NOTE — ED Notes (Signed)
Pt found vaping in his room.  Notified that was not allowed in the hospital.

## 2019-02-18 NOTE — ED Triage Notes (Signed)
Per [facility] pains with n/v for two days.

## 2019-02-18 NOTE — ED Notes (Signed)
61.23 kg is patient weight.

## 2019-02-18 NOTE — ED Notes (Signed)
Unsuccessful IV attempt X2 
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
## 2019-02-18 NOTE — Discharge Instructions (Signed)
Please take antibiotics as prescribed.  If you develop cough, difficulty in breathing, fever, please return immediately to the ER.  Otherwise recommend following up with the sickle cell clinic on Monday morning.
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
## 2019-02-19 ENCOUNTER — Emergency Department (HOSPITAL_COMMUNITY)
Admission: EM | Admit: 2019-02-19 | Discharge: 2019-02-19 | Disposition: A | Discharge status: Home / Self Care | Payer: Medicaid Other | Source: Home / Self Care | Attending: Emergency Medicine | Admitting: Emergency Medicine

## 2019-02-19 ENCOUNTER — Encounter (HOSPITAL_COMMUNITY): Payer: Self-pay | Admitting: Emergency Medicine

## 2019-02-19 ENCOUNTER — Encounter (HOSPITAL_COMMUNITY): Payer: Self-pay

## 2019-02-19 ENCOUNTER — Other Ambulatory Visit: Payer: Self-pay

## 2019-02-19 DIAGNOSIS — Z20822 Contact with and (suspected) exposure to covid-19: Secondary | ICD-10-CM | POA: Insufficient documentation

## 2019-02-19 DIAGNOSIS — F1910 Other psychoactive substance abuse, uncomplicated: Secondary | ICD-10-CM | POA: Insufficient documentation

## 2019-02-19 DIAGNOSIS — D5700 Hb-SS disease with crisis, unspecified: Secondary | ICD-10-CM

## 2019-02-19 DIAGNOSIS — G894 Chronic pain syndrome: Secondary | ICD-10-CM | POA: Insufficient documentation

## 2019-02-19 DIAGNOSIS — D5780 Other sickle-cell disorders without crisis: Secondary | ICD-10-CM | POA: Insufficient documentation

## 2019-02-19 DIAGNOSIS — Z765 Malingerer [conscious simulation]: Secondary | ICD-10-CM | POA: Insufficient documentation

## 2019-02-19 DIAGNOSIS — Z79899 Other long term (current) drug therapy: Secondary | ICD-10-CM | POA: Insufficient documentation

## 2019-02-19 DIAGNOSIS — F17210 Nicotine dependence, cigarettes, uncomplicated: Secondary | ICD-10-CM | POA: Insufficient documentation

## 2019-02-19 LAB — CBC WITH DIFFERENTIAL/PLATELET
Abs Immature Granulocytes: 0.13 10*3/uL — ABNORMAL HIGH (ref 0.00–0.07)
Basophils Absolute: 0.1 10*3/uL (ref 0.0–0.1)
Basophils Relative: 1 %
Eosinophils Absolute: 0.6 10*3/uL — ABNORMAL HIGH (ref 0.0–0.5)
Eosinophils Relative: 4 %
HCT: 21.7 % — ABNORMAL LOW (ref 39.0–52.0)
Hemoglobin: 7.2 g/dL — ABNORMAL LOW (ref 13.0–17.0)
Immature Granulocytes: 1 %
Lymphocytes Relative: 34 %
Lymphs Abs: 5 10*3/uL — ABNORMAL HIGH (ref 0.7–4.0)
MCH: 32.4 pg (ref 26.0–34.0)
MCHC: 33.2 g/dL (ref 30.0–36.0)
MCV: 97.7 fL (ref 80.0–100.0)
Monocytes Absolute: 1.5 10*3/uL — ABNORMAL HIGH (ref 0.1–1.0)
Monocytes Relative: 10 %
Neutro Abs: 7.2 10*3/uL (ref 1.7–7.7)
Neutrophils Relative %: 50 %
Platelets: 426 10*3/uL — ABNORMAL HIGH (ref 150–400)
RBC: 2.22 MIL/uL — ABNORMAL LOW (ref 4.22–5.81)
RDW: 17.6 % — ABNORMAL HIGH (ref 11.5–15.5)
WBC: 14.6 10*3/uL — ABNORMAL HIGH (ref 4.0–10.5)
nRBC: 0.5 % — ABNORMAL HIGH (ref 0.0–0.2)

## 2019-02-19 LAB — RETICULOCYTES
Immature Retic Fract: 31.5 % — ABNORMAL HIGH (ref 2.3–15.9)
RBC.: 2.25 MIL/uL — ABNORMAL LOW (ref 4.22–5.81)
Retic Count, Absolute: 291.4 10*3/uL — ABNORMAL HIGH (ref 19.0–186.0)
Retic Ct Pct: 13 % — ABNORMAL HIGH (ref 0.4–3.1)

## 2019-02-19 LAB — COMPREHENSIVE METABOLIC PANEL
ALT: 11 U/L (ref 0–44)
AST: 24 U/L (ref 15–41)
Albumin: 3.6 g/dL (ref 3.5–5.0)
Alkaline Phosphatase: 73 U/L (ref 38–126)
Anion gap: 8 (ref 5–15)
BUN: <5 mg/dL — ABNORMAL LOW (ref 6–20)
CO2: 28 mmol/L (ref 22–32)
Calcium: 9.1 mg/dL (ref 8.9–10.3)
Chloride: 102 mmol/L (ref 98–111)
Creatinine, Ser: 0.62 mg/dL (ref 0.61–1.24)
GFR calc Af Amer: >60 mL/min (ref >60)
GFR calc non Af Amer: >60 mL/min (ref >60)
Glucose, Bld: 125 mg/dL — ABNORMAL HIGH (ref 70–99)
Potassium: 3.7 mmol/L (ref 3.5–5.1)
Sodium: 138 mmol/L (ref 135–145)
Total Bilirubin: 4.2 mg/dL — ABNORMAL HIGH (ref 0.3–1.2)
Total Protein: 7.3 g/dL (ref 6.5–8.1)

## 2019-02-19 LAB — SARS CORONAVIRUS 2 (TAT 6-24 HRS): SARS Coronavirus 2: NEGATIVE

## 2019-02-19 MED ORDER — HYDROMORPHONE HCL 1 MG/ML IJ SOLN
2.0000 mg | INTRAMUSCULAR | Status: AC
  Filled 2019-02-19: qty 2

## 2019-02-19 MED ORDER — ONDANSETRON 4 MG PO TBDP
8.0000 mg | ORAL_TABLET | Freq: Once | ORAL | Status: AC
  Administered 2019-02-19: 8 mg via ORAL
  Filled 2019-02-19: qty 2

## 2019-02-19 MED ORDER — DIPHENHYDRAMINE HCL 50 MG/ML IJ SOLN
25.0000 mg | Freq: Once | INTRAMUSCULAR | Status: AC
  Administered 2019-02-19: 25 mg via INTRAVENOUS
  Filled 2019-02-19: qty 1

## 2019-02-19 MED ORDER — HYDROMORPHONE HCL 1 MG/ML IJ SOLN: 2.0000 mg | INTRAMUSCULAR | Status: AC

## 2019-02-19 MED ORDER — HYDROMORPHONE HCL 1 MG/ML IJ SOLN
2.0000 mg | INTRAMUSCULAR | Status: AC
  Administered 2019-02-19 (×2): 2 mg via INTRAVENOUS
  Filled 2019-02-19 (×2): qty 2

## 2019-02-19 MED ORDER — HYDROMORPHONE HCL 1 MG/ML IJ SOLN
1.0000 mg | Freq: Once | INTRAMUSCULAR | Status: AC
  Administered 2019-02-19: 1 mg via INTRAVENOUS
  Filled 2019-02-19: qty 1

## 2019-02-19 MED ORDER — DIPHENHYDRAMINE HCL 50 MG/ML IJ SOLN
12.5000 mg | Freq: Once | INTRAMUSCULAR | Status: AC
  Administered 2019-02-19: 12.5 mg via INTRAVENOUS
  Filled 2019-02-19: qty 1

## 2019-02-19 MED ORDER — HYDROMORPHONE HCL 1 MG/ML IJ SOLN
2.0000 mg | INTRAMUSCULAR | Status: AC
  Administered 2019-02-19: 2 mg via INTRAVENOUS

## 2019-02-19 MED ORDER — PROMETHAZINE HCL 25 MG RE SUPP: 25.0000 mg | Freq: Four times a day (QID) | RECTAL | 0 refills | Status: AC | PRN

## 2019-02-19 MED ORDER — KETOROLAC TROMETHAMINE 30 MG/ML IJ SOLN
30.0000 mg | INTRAMUSCULAR | Status: AC
  Administered 2019-02-19: 30 mg via INTRAVENOUS
  Filled 2019-02-19: qty 1

## 2019-02-19 MED ORDER — DIPHENHYDRAMINE HCL 50 MG/ML IJ SOLN
25.0000 mg | INTRAMUSCULAR | Status: DC | PRN
  Administered 2019-02-19: 25 mg via INTRAVENOUS
  Filled 2019-02-19: qty 1

## 2019-02-19 MED ORDER — ONDANSETRON HCL 4 MG/2ML IJ SOLN
4.0000 mg | Freq: Once | INTRAMUSCULAR | Status: AC
  Administered 2019-02-19: 4 mg via INTRAVENOUS
  Filled 2019-02-19: qty 2

## 2019-02-19 MED ORDER — ONDANSETRON HCL 4 MG/2ML IJ SOLN: 4.0000 mg | INTRAMUSCULAR | Status: DC | PRN

## 2019-02-19 MED ORDER — SODIUM CHLORIDE 0.45 % IV SOLN: INTRAVENOUS | Status: DC

## 2019-02-19 MED ORDER — PROMETHAZINE HCL 25 MG/ML IJ SOLN
12.5000 mg | Freq: Once | INTRAMUSCULAR | Status: AC
  Administered 2019-02-19: 12.5 mg via INTRAMUSCULAR
  Filled 2019-02-19: qty 1

## 2019-02-19 MED ORDER — PROMETHAZINE HCL 25 MG/ML IJ SOLN
25.0000 mg | Freq: Once | INTRAMUSCULAR | Status: AC
  Administered 2019-02-19: 25 mg via INTRAVENOUS
  Filled 2019-02-19: qty 1

## 2019-02-19 MED ORDER — OXYCODONE HCL 5 MG PO TABS
30.0000 mg | ORAL_TABLET | Freq: Once | ORAL | Status: AC
  Administered 2019-02-19: 30 mg via ORAL
  Filled 2019-02-19: qty 6

## 2019-02-19 NOTE — ED Provider Notes (Signed)
Signout note  29 year old male with sickle cell disease presents to ER with sickle cell pain.  Dr. Preston Fleeting evaluated patient, placed on sickle cell protocol, continued to report pain despite 3 doses of IV narcotic and consult for admission was placed.  I discussed case with [name] and reviewed in detail, he feels pain much more likely related to chronic pain in unlikely acute pain crisis.  He recommends discharge and follow-up in sickle cell clinic tomorrow morning.  Evaluated patient, he is well-appearing, no distress, eating his breakfast tray without any difficulty.  Will discharge home.   Milagros Loll, MD 02/19/19 [phone]

## 2019-02-19 NOTE — ED Triage Notes (Signed)
Pt states he is returning due to continued pain all over.  Pt seen in ED during the night and discharged this morning for sickle cell crisis.  Reports nausea and vomiting.

## 2019-02-19 NOTE — Discharge Instructions (Addendum)
Follow up at the sickle cell clinic tomorrow morning. May start using the Phenergan suppositories instead of the oral Phenergan.  This should be sufficient to control vomiting enough to allow for you to take your oral pain medication.

## 2019-02-19 NOTE — ED Notes (Signed)
Shari, PA at bedside. 

## 2019-02-19 NOTE — ED Notes (Signed)
Breakfast tray provided. 

## 2019-02-19 NOTE — ED Notes (Addendum)
Discharge instructions and prescription discussed with Pt. Pt verbalized understanding. Pt stable and ambulatory.    
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
## 2019-02-19 NOTE — ED Notes (Signed)
This nurse to pt room to review discharge summary, pt not in room. Apparently pt left hospital prior to receiving discharge summary. Pt did inform this nurse prior that brother was on his way as discharge ride home.
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
## 2019-02-19 NOTE — Discharge Instructions (Addendum)
You were given a prescription for Phenergan suppositories on the last of your 3 visits to the ER today. Please pick this up and take as directed.   You are being discharged home now. It is felt appropriate that you take your regular pain medications when you return home.   PLEASE FOLLOW UP WITH THE [facility] OR WITH YOUR DOCTOR TOMORROW.

## 2019-02-19 NOTE — ED Triage Notes (Signed)
Pt BIB [facility] from home with c/o sickle cell pain all over x3 days ago. Pt was seen at [facility] for the same, received Nausea meds and is still c/o of nausea.
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
## 2019-02-19 NOTE — Discharge Instructions (Addendum)
Please go to the sickle cell clinic tomorrow morning.  Return to ER if you develop worsening pain, fever, difficulty breathing, chest pain or other new concerning symptom.  Continue taking the antibiotic as previously prescribed.

## 2019-02-19 NOTE — ED Triage Notes (Addendum)
Patient arrived with EMS from street reports generalized back pain today , denies injury/ambulatory , respirations unlabored . Patient was seen here last night received narcotic pain medications diagnosed with chronic pain syndrome.

## 2019-02-20 ENCOUNTER — Inpatient Hospital Stay (HOSPITAL_COMMUNITY)
Admission: AD | Admit: 2019-02-20 | Discharge: 2019-02-24 | DRG: 812 | Disposition: A | Discharge status: Home / Self Care | Payer: Medicaid Other | Attending: Internal Medicine | Admitting: Internal Medicine

## 2019-02-20 ENCOUNTER — Other Ambulatory Visit: Payer: Self-pay

## 2019-02-20 ENCOUNTER — Encounter (HOSPITAL_COMMUNITY): Payer: Self-pay | Admitting: Obstetrics and Gynecology

## 2019-02-20 ENCOUNTER — Emergency Department (HOSPITAL_COMMUNITY)
Admission: EM | Admit: 2019-02-20 | Discharge: 2019-02-20 | Disposition: A | Discharge status: Home / Self Care | Payer: Medicaid Other | Source: Home / Self Care | Attending: Emergency Medicine | Admitting: Emergency Medicine

## 2019-02-20 DIAGNOSIS — D5700 Hb-SS disease with crisis, unspecified: Principal | ICD-10-CM | POA: Diagnosis present

## 2019-02-20 DIAGNOSIS — F17210 Nicotine dependence, cigarettes, uncomplicated: Secondary | ICD-10-CM | POA: Insufficient documentation

## 2019-02-20 DIAGNOSIS — Z833 Family history of diabetes mellitus: Secondary | ICD-10-CM

## 2019-02-20 DIAGNOSIS — Z880 Allergy status to penicillin: Secondary | ICD-10-CM | POA: Diagnosis not present

## 2019-02-20 DIAGNOSIS — D72829 Elevated white blood cell count, unspecified: Secondary | ICD-10-CM | POA: Diagnosis not present

## 2019-02-20 DIAGNOSIS — Z765 Malingerer [conscious simulation]: Secondary | ICD-10-CM | POA: Diagnosis not present

## 2019-02-20 DIAGNOSIS — D649 Anemia, unspecified: Secondary | ICD-10-CM | POA: Diagnosis not present

## 2019-02-20 DIAGNOSIS — F1910 Other psychoactive substance abuse, uncomplicated: Secondary | ICD-10-CM

## 2019-02-20 DIAGNOSIS — G894 Chronic pain syndrome: Secondary | ICD-10-CM | POA: Diagnosis present

## 2019-02-20 DIAGNOSIS — Z832 Family history of diseases of the blood and blood-forming organs and certain disorders involving the immune mechanism: Secondary | ICD-10-CM | POA: Diagnosis not present

## 2019-02-20 DIAGNOSIS — Z79899 Other long term (current) drug therapy: Secondary | ICD-10-CM

## 2019-02-20 DIAGNOSIS — Z720 Tobacco use: Secondary | ICD-10-CM | POA: Diagnosis not present

## 2019-02-20 DIAGNOSIS — F1120 Opioid dependence, uncomplicated: Secondary | ICD-10-CM | POA: Diagnosis present

## 2019-02-20 DIAGNOSIS — Z20822 Contact with and (suspected) exposure to covid-19: Secondary | ICD-10-CM | POA: Diagnosis present

## 2019-02-20 DIAGNOSIS — D638 Anemia in other chronic diseases classified elsewhere: Secondary | ICD-10-CM | POA: Diagnosis present

## 2019-02-20 DIAGNOSIS — Z881 Allergy status to other antibiotic agents status: Secondary | ICD-10-CM

## 2019-02-20 DIAGNOSIS — Z9049 Acquired absence of other specified parts of digestive tract: Secondary | ICD-10-CM | POA: Diagnosis not present

## 2019-02-20 DIAGNOSIS — F17209 Nicotine dependence, unspecified, with unspecified nicotine-induced disorders: Secondary | ICD-10-CM

## 2019-02-20 DIAGNOSIS — Z825 Family history of asthma and other chronic lower respiratory diseases: Secondary | ICD-10-CM

## 2019-02-20 DIAGNOSIS — G8929 Other chronic pain: Secondary | ICD-10-CM | POA: Diagnosis present

## 2019-02-20 LAB — CBC
HCT: 23 % — ABNORMAL LOW (ref 39.0–52.0)
Hemoglobin: 7.6 g/dL — ABNORMAL LOW (ref 13.0–17.0)
MCH: 32.3 pg (ref 26.0–34.0)
MCHC: 33 g/dL (ref 30.0–36.0)
MCV: 97.9 fL (ref 80.0–100.0)
Platelets: 436 10*3/uL — ABNORMAL HIGH (ref 150–400)
RBC: 2.35 MIL/uL — ABNORMAL LOW (ref 4.22–5.81)
RDW: 18 % — ABNORMAL HIGH (ref 11.5–15.5)
WBC: 14.2 10*3/uL — ABNORMAL HIGH (ref 4.0–10.5)
nRBC: 0.8 % — ABNORMAL HIGH (ref 0.0–0.2)

## 2019-02-20 MED ORDER — POLYETHYLENE GLYCOL 3350 17 G PO PACK: 17.0000 g | PACK | Freq: Every day | ORAL | Status: DC | PRN

## 2019-02-20 MED ORDER — OXYCODONE HCL 5 MG PO TABS
20.0000 mg | ORAL_TABLET | Freq: Once | ORAL | Status: AC
  Administered 2019-02-20: 20 mg via ORAL
  Filled 2019-02-20: qty 4

## 2019-02-20 MED ORDER — HYDROXYUREA 500 MG PO CAPS
1000.0000 mg | ORAL_CAPSULE | Freq: Every day | ORAL | Status: DC
  Administered 2019-02-20 – 2019-02-23 (×4): 1000 mg via ORAL
  Filled 2019-02-20 (×4): qty 2

## 2019-02-20 MED ORDER — SENNOSIDES-DOCUSATE SODIUM 8.6-50 MG PO TABS
1.0000 | ORAL_TABLET | Freq: Two times a day (BID) | ORAL | Status: DC
  Administered 2019-02-21: 11:00:00 1 via ORAL
  Filled 2019-02-20 (×3): qty 1

## 2019-02-20 MED ORDER — FOLIC ACID 1 MG PO TABS
1.0000 mg | ORAL_TABLET | Freq: Every day | ORAL | Status: DC
  Administered 2019-02-20 – 2019-02-24 (×5): 1 mg via ORAL
  Filled 2019-02-20 (×5): qty 1

## 2019-02-20 MED ORDER — KETOROLAC TROMETHAMINE 15 MG/ML IJ SOLN
15.0000 mg | Freq: Four times a day (QID) | INTRAMUSCULAR | Status: DC
  Administered 2019-02-21 – 2019-02-24 (×8): 15 mg via INTRAVENOUS
  Filled 2019-02-20 (×9): qty 1

## 2019-02-20 MED ORDER — PROMETHAZINE HCL 25 MG PO TABS: 12.5000 mg | ORAL_TABLET | Freq: Four times a day (QID) | ORAL | Status: DC | PRN

## 2019-02-20 MED ORDER — NALOXONE HCL 0.4 MG/ML IJ SOLN: 0.4000 mg | INTRAMUSCULAR | Status: DC | PRN

## 2019-02-20 MED ORDER — HYDROMORPHONE HCL 2 MG/ML IJ SOLN
2.0000 mg | Freq: Once | INTRAMUSCULAR | Status: AC
  Administered 2019-02-20: 2 mg via SUBCUTANEOUS
  Filled 2019-02-20: qty 1

## 2019-02-20 MED ORDER — SODIUM CHLORIDE 0.9% FLUSH: 9.0000 mL | INTRAVENOUS | Status: DC | PRN

## 2019-02-20 MED ORDER — ACETAMINOPHEN 500 MG PO TABS
1000.0000 mg | ORAL_TABLET | Freq: Once | ORAL | Status: AC
  Administered 2019-02-20: 1000 mg via ORAL
  Filled 2019-02-20: qty 2

## 2019-02-20 MED ORDER — HYDROMORPHONE 1 MG/ML IV SOLN
INTRAVENOUS | Status: DC
  Administered 2019-02-20: 30 mg via INTRAVENOUS
  Administered 2019-02-20: 3 mg via INTRAVENOUS
  Administered 2019-02-20: 20 mg via INTRAVENOUS
  Administered 2019-02-20: 8 mg via INTRAVENOUS
  Administered 2019-02-20: 30 mg via INTRAVENOUS
  Administered 2019-02-21: 1 mg via INTRAVENOUS
  Administered 2019-02-21: 2.5 mg via INTRAVENOUS
  Filled 2019-02-20 (×2): qty 30

## 2019-02-20 MED ORDER — ONDANSETRON HCL 4 MG/2ML IJ SOLN: 4.0000 mg | Freq: Four times a day (QID) | INTRAMUSCULAR | Status: DC | PRN

## 2019-02-20 MED ORDER — ENOXAPARIN SODIUM 40 MG/0.4ML ~~LOC~~ SOLN
40.0000 mg | SUBCUTANEOUS | Status: DC
  Filled 2019-02-20: qty 0.4

## 2019-02-20 MED ORDER — KETOROLAC TROMETHAMINE 30 MG/ML IJ SOLN
15.0000 mg | Freq: Once | INTRAMUSCULAR | Status: AC
  Administered 2019-02-20: 10:00:00 15 mg via INTRAVENOUS
  Filled 2019-02-20: qty 1

## 2019-02-20 MED ORDER — SODIUM CHLORIDE 0.45 % IV SOLN: INTRAVENOUS | Status: DC

## 2019-02-20 MED ORDER — SODIUM CHLORIDE 0.9 % IV SOLN
25.0000 mg | INTRAVENOUS | Status: DC | PRN
  Filled 2019-02-20: qty 0.5

## 2019-02-20 MED ORDER — DIPHENHYDRAMINE HCL 25 MG PO CAPS: 25.0000 mg | ORAL_CAPSULE | ORAL | Status: DC | PRN
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
## 2019-02-20 NOTE — Progress Notes (Signed)
Patient admitted to the day hospital for sickle cell pain crisis. Initially, patient reported generalized pain rated 8/10. For pain management, patient placed on Dilaudid PCA, given 15 mg Toradol, 1000 mg Tylenol, 20 mg Oxycodone and hydrated with IV fluids. Patient's pain remained elevated 7/10. Provider notified and orders placed for admission to inpatient unit. Report called to Selena Batten, RN on [address]. Patient transferred to [address] in wheelchair. Vital signs stable.  Alert, oriented and stable at transfer.
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
## 2019-02-20 NOTE — Discharge Instructions (Addendum)
Go to the sickle cell center for further evaluation today.

## 2019-02-20 NOTE — BH Specialist Note (Signed)
Integrated Behavioral Health Referral Note  Brief check-in with patient at bedside in the day hospital. Patient reported receiving housing application from [employer], but has not completed the application yet. Advised patient to call CSW for assistance with the application as needed.   Patient may need assistance with transportation home at discharge, will be admitted under observation this evening per NP. CSW to follow for support.   Abigail Butts, LCSW [facility] [phone]

## 2019-02-20 NOTE — ED Triage Notes (Signed)
Pt presents to the ED for complaint of SCC. Pt seen for same less than 12 hours ago.
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
## 2019-02-21 DIAGNOSIS — D72829 Elevated white blood cell count, unspecified: Secondary | ICD-10-CM

## 2019-02-21 DIAGNOSIS — D649 Anemia, unspecified: Secondary | ICD-10-CM

## 2019-02-21 LAB — CBC
HCT: 22.5 % — ABNORMAL LOW (ref 39.0–52.0)
Hemoglobin: 7.4 g/dL — ABNORMAL LOW (ref 13.0–17.0)
MCH: 32.6 pg (ref 26.0–34.0)
MCHC: 32.9 g/dL (ref 30.0–36.0)
MCV: 99.1 fL (ref 80.0–100.0)
Platelets: 354 10*3/uL (ref 150–400)
RBC: 2.27 MIL/uL — ABNORMAL LOW (ref 4.22–5.81)
RDW: 18 % — ABNORMAL HIGH (ref 11.5–15.5)
WBC: 18 10*3/uL — ABNORMAL HIGH (ref 4.0–10.5)
nRBC: 0.7 % — ABNORMAL HIGH (ref 0.0–0.2)

## 2019-02-21 MED ORDER — OXYCODONE HCL 5 MG PO TABS
15.0000 mg | ORAL_TABLET | ORAL | Status: DC | PRN
  Administered 2019-02-21 – 2019-02-23 (×10): 15 mg via ORAL
  Filled 2019-02-21 (×11): qty 3

## 2019-02-21 MED ORDER — HYDROMORPHONE 1 MG/ML IV SOLN
INTRAVENOUS | Status: DC
  Administered 2019-02-21: 30 mg via INTRAVENOUS
  Administered 2019-02-21: 5.5 mg via INTRAVENOUS
  Administered 2019-02-22: 6 mg via INTRAVENOUS
  Administered 2019-02-22: 4.5 mg via INTRAVENOUS
  Administered 2019-02-22: 7 mg via INTRAVENOUS
  Administered 2019-02-22: 4 mg via INTRAVENOUS
  Filled 2019-02-21: qty 30

## 2019-02-21 NOTE — Progress Notes (Signed)
Subjective: Matthew Shannon is a 29 year old male with a medical history significant for sickle cell disease, chronic pain syndrome, opiate dependence and tolerance, history of polysubstance tobacco dependence, and anemia of chronic disease was admitted for sickle cell pain crisis.  Patient states the pain intensity is not improving on today.  He rates pain as 8/10.  Pain is localized to low back. He denies dizziness, headache, blurred vision, shortness of breath, urinary symptoms, nausea, vomiting, or diarrhea.  Objective:  Vital signs in last 24 hours:  Vitals:   02/21/19 1220 02/21/19 1413 02/21/19 1710 02/21/19 1801  BP:  108/69  105/65  Pulse:  80  77  Resp: 14 14 13 16   Temp:  98.6 F (37 C)  98.4 F (36.9 C)  TempSrc:  Oral  Oral  SpO2: 97% 99% 95% 97%  Weight:        Intake/Output from previous day:   Intake/Output Summary (Last 24 hours) at 02/21/2019 1815 Last data filed at 02/20/2019 1900 Gross per 24 hour  Intake -  Output 575 ml  Net -575 ml    Physical Exam: General: Alert, awake, oriented x3, in no acute distress.  HEENT: [place]/AT PEERL, EOMI Neck: Trachea midline,  no masses, no thyromegal,y no JVD, no carotid bruit OROPHARYNX:  Moist, No exudate/ erythema/lesions.  Heart: Regular rate and rhythm, without murmurs, rubs, gallops, PMI non-displaced, no heaves or thrills on palpation.  Lungs: Clear to auscultation, no wheezing or rhonchi noted. No increased vocal fremitus resonant to percussion  Abdomen: Soft, nontender, nondistended, positive bowel sounds, no masses no hepatosplenomegaly noted..  Neuro: No focal neurological deficits noted cranial nerves II through XII grossly intact. DTRs 2+ bilaterally upper and lower extremities. Strength 5 out of 5 in bilateral upper and lower extremities. Musculoskeletal: No warm swelling or erythema around joints, no spinal tenderness noted. Psychiatric: Patient alert and oriented x3, good insight and cognition, good recent to  remote recall. Lymph node survey: No cervical axillary or inguinal lymphadenopathy noted.  Lab Results:  Basic Metabolic Panel:    Component Value Date/Time   NA 138 02/19/2019 0431   NA 137 11/29/2018 1224   K 3.7 02/19/2019 0431   CL 102 02/19/2019 0431   CO2 28 02/19/2019 0431   BUN <5 (L) 02/19/2019 0431   BUN 5 (L) 11/29/2018 1224   CREATININE 0.62 02/19/2019 0431   GLUCOSE 125 (H) 02/19/2019 0431   CALCIUM 9.1 02/19/2019 0431   CBC:    Component Value Date/Time   WBC 18.0 (H) 02/21/2019 0534   HGB 7.4 (L) 02/21/2019 0534   HGB 8.1 (L) 11/29/2018 1224   HCT 22.5 (L) 02/21/2019 0534   HCT 24.6 (L) 11/29/2018 1224   PLT 354 02/21/2019 0534   PLT 330 11/29/2018 1224   MCV 99.1 02/21/2019 0534   MCV 104 (H) 11/29/2018 1224   NEUTROABS 7.2 02/19/2019 0431   NEUTROABS 6.4 11/29/2018 1224   LYMPHSABS 5.0 (H) 02/19/2019 0431   LYMPHSABS 3.9 (H) 11/29/2018 1224   MONOABS 1.5 (H) 02/19/2019 0431   EOSABS 0.6 (H) 02/19/2019 0431   EOSABS 0.3 11/29/2018 1224   BASOSABS 0.1 02/19/2019 0431   BASOSABS 0.1 11/29/2018 1224    Recent Results (from the past 240 hour(s))  SARS CORONAVIRUS 2 (TAT 6-24 HRS) Nasopharyngeal Nasopharyngeal Swab     Status: None   Collection Time: 02/19/19  8:24 AM   Specimen: Nasopharyngeal Swab  Result Value Ref Range Status   SARS Coronavirus 2 NEGATIVE NEGATIVE Final  Comment: (NOTE) SARS-CoV-2 target nucleic acids are NOT DETECTED. The SARS-CoV-2 RNA is generally detectable in upper and lower respiratory specimens during the acute phase of infection. Negative results do not preclude SARS-CoV-2 infection, do not rule out co-infections with other pathogens, and should not be used as the sole basis for treatment or other patient management decisions. Negative results must be combined with clinical observations, patient history, and epidemiological information. The expected result is Negative. Fact Sheet for  Patients: HairSlick.no Fact Sheet for Healthcare Providers: quierodirigir.com This test is not yet approved or cleared by the [place] FDA and  has been authorized for detection and/or diagnosis of SARS-CoV-2 by FDA under an Emergency Use Authorization (EUA). This EUA will remain  in effect (meaning this test can be used) for the duration of the COVID-19 declaration under Section 56 4(b)(1) of the Act, 21 U.S.C. section 360bbb-3(b)(1), unless the authorization is terminated or revoked sooner. Performed at [facility], [address] [place], [place] [address]     Studies/Results: No results found.  Medications: Scheduled Meds: . enoxaparin (LOVENOX) injection  40 mg Subcutaneous Q24H  . folic acid  1 mg Oral Daily  . HYDROmorphone   Intravenous Q4H  . hydroxyurea  1,000 mg Oral Daily  . ketorolac  15 mg Intravenous Q6H  . senna-docusate  1 tablet Oral BID   Continuous Infusions: PRN Meds:.diphenhydrAMINE **OR** [DISCONTINUED] diphenhydrAMINE, naloxone **AND** sodium chloride flush, oxyCODONE, polyethylene glycol, promethazine  Consultants:  None  Procedures:  None  Antibiotics:  None  Assessment/Plan: Principal Problem:   Sickle cell pain crisis (HCC) Active Problems:   Tobacco abuse   Leukocytosis   Chronic pain   Polysubstance abuse (HCC)  Sickle cell disease with pain crisis: Weaning IV Dilaudid PCA.  Settings changed to 0.5 mg, 10-minute lockout, and 2 mg/h. Oxycodone 15 mg every 4 hours as needed for severe breakthrough pain Toradol 15 mg IV every 6 hours for total of 5 days Monitor vital signs closely and reevaluate pain scale regularly Continue to refrain from clinician assisted IV Dilaudid boluses.  Leukocytosis: WBCs 18, which is increased from prior.  Patient continues to be afebrile without signs of infection or inflammation.  Suspected to be reactive.  Repeat CBC in a.m.  Sickle  cell anemia: Hemoglobin 7.4, consistent with patient's baseline.  There is no clinical indication for blood transfusion at this time.  Continue to monitor closely.  Chronic pain syndrome: Continue home medications  History of polysubstance abuse: Urine drug screen pending  Tobacco dependence: Counseled at length on admission.  Patient continues to vape to assist with smoking cessation, declines nicotine patches. Code Status: Full Code Family Communication: N/A Disposition Plan: Not yet ready for discharge  Katrinna Travieso Rennis Petty  APRN, MSN, FNP-C [facility] [address] [place], [place] [address] [phone]  If 5 PM-7AM, please contact night-coverage.  02/21/2019, 6:15 PM  LOS: 1 day

## 2019-02-22 LAB — URINALYSIS, ROUTINE W REFLEX MICROSCOPIC
Bilirubin Urine: NEGATIVE
Glucose, UA: NEGATIVE mg/dL
Hgb urine dipstick: NEGATIVE
Ketones, ur: NEGATIVE mg/dL
Leukocytes,Ua: NEGATIVE
Nitrite: NEGATIVE
Protein, ur: NEGATIVE mg/dL
Specific Gravity, Urine: 1.01 (ref 1.005–1.030)
pH: 6 (ref 5.0–8.0)

## 2019-02-22 LAB — CBC
HCT: 21.9 % — ABNORMAL LOW (ref 39.0–52.0)
Hemoglobin: 7.4 g/dL — ABNORMAL LOW (ref 13.0–17.0)
MCH: 31.9 pg (ref 26.0–34.0)
MCHC: 33.8 g/dL (ref 30.0–36.0)
MCV: 94.4 fL (ref 80.0–100.0)
Platelets: 380 10*3/uL (ref 150–400)
RBC: 2.32 MIL/uL — ABNORMAL LOW (ref 4.22–5.81)
RDW: 16.5 % — ABNORMAL HIGH (ref 11.5–15.5)
WBC: 12.9 10*3/uL — ABNORMAL HIGH (ref 4.0–10.5)
nRBC: 0.7 % — ABNORMAL HIGH (ref 0.0–0.2)

## 2019-02-22 LAB — RAPID URINE DRUG SCREEN, HOSP PERFORMED
Amphetamines: NOT DETECTED
Barbiturates: NOT DETECTED
Benzodiazepines: NOT DETECTED
Cocaine: NOT DETECTED
Opiates: POSITIVE — AB
Tetrahydrocannabinol: NOT DETECTED

## 2019-02-22 MED ORDER — ACETAMINOPHEN 325 MG PO TABS
650.0000 mg | ORAL_TABLET | Freq: Four times a day (QID) | ORAL | Status: DC | PRN
  Administered 2019-02-22 – 2019-02-24 (×5): 650 mg via ORAL
  Filled 2019-02-22 (×7): qty 2

## 2019-02-22 MED ORDER — HYDROMORPHONE 1 MG/ML IV SOLN
INTRAVENOUS | Status: DC
  Administered 2019-02-22: 3.5 mg via INTRAVENOUS
  Administered 2019-02-22: 5 mg via INTRAVENOUS
  Administered 2019-02-22: 30 mg via INTRAVENOUS
  Administered 2019-02-23: 4 mg via INTRAVENOUS
  Administered 2019-02-23: 3.5 mg via INTRAVENOUS
  Administered 2019-02-23: 0 mg via INTRAVENOUS
  Filled 2019-02-22: qty 30

## 2019-02-22 MED ORDER — PROMETHAZINE HCL 25 MG/ML IJ SOLN
6.2500 mg | INTRAMUSCULAR | Status: DC | PRN
  Administered 2019-02-22 – 2019-02-24 (×10): 6.25 mg via INTRAVENOUS
  Filled 2019-02-22 (×11): qty 1

## 2019-02-22 NOTE — BH Specialist Note (Signed)
[facility]ated [facility]al [facility] Referral Note  Reason for Referral: Consuelo Fuson is a 29 y.o. male  Pt was referred by MD, Jegede for: frequent ED encounters   Pt reports the following concerns:   Assessment: CSW has been following patient peripherally. Patient's only concerns thus far have been transportation and housing, both addressed. Patient referred to CSW by MD for frequent ED encounters, particularly in the past few days before presenting at the sickle cell clinic on 1/11.   Met with patient at bedside in [facility] and reflected on frequent encounters. CSW reflected on concerns about patient presenting in the ED 3x in one day. Patient exhibits pattern of visiting ED multiple times in one day. Motivational interviewing to raise awareness of issue and elicit patient's perspective. Patient reports he presented in the ED several times because his pain was uncontrolled and he could not keep home medications down due to nausea. Reported that he has no concerns about efficacy of home medication or running out of medication early. Patient denied any additional concerns.   Plan: 1. Addressed today: Assessed patient's awareness of frequency of encounters and perspective on need for support. Referred patient to [facility] for additional support.   2. Referral: [facility]  3. Follow up: CSW will continue to follow patient from sickle cell clinic. Will coordinate with [facility] case worker as needed.  Susan Porter, LCSW [facility] [phone] 

## 2019-02-22 NOTE — Progress Notes (Signed)
   Vital Signs MEWS/VS Documentation      02/22/2019 0335 02/22/2019 0544 02/22/2019 0944 02/22/2019 0955   MEWS Score:  1  1  2  1    MEWS Score Color:  Green  Green  Yellow  Green   Resp:  16  16  13   --   Pulse:  --  73  --  72   BP:  --  98/62  --  106/68   Temp:  --  98 F (36.7 C)  --  98.2 F (36.8 C)   O2 Device:  Room           Dionne 02/22/2019,10:07 AM

## 2019-02-22 NOTE — Progress Notes (Addendum)
Subjective: Matthew Shannon is a 29 year old male with a medical history significant for sickle cell disease, chronic pain syndrome, opiate dependence and tolerance, history of anemia of chronic disease, history of polysubstance abuse was admitted for sickle cell pain crisis. Patient states that pain intensity remains increased on today.  He is writhing and moaning in bed today.  He is complaining of nausea.  He has not requested nausea medication.  Pain intensity 8/10 primarily to low back.  Patient has not been out of bed.  He denies headache, chest pain, shortness of breath, dizziness, paresthesias, urinary symptoms, vomiting, or diarrhea.  Objective:  Vital signs in last 24 hours:  Vitals:   02/22/19 1444 02/22/19 1638 02/22/19 1648 02/22/19 1816  BP: 102/70 112/63  107/67  Pulse: 80 80  86  Resp: 18 16 18 18   Temp: 97.8 F (36.6 C) 98.1 F (36.7 C)  98.5 F (36.9 C)  TempSrc: Oral Oral  Oral  SpO2: 97% 94% 95% 95%  Weight:      Height:        Intake/Output from previous day:   Intake/Output Summary (Last 24 hours) at 02/22/2019 1937 Last data filed at 02/22/2019 1207 Gross per 24 hour  Intake -  Output 1225 ml  Net -1225 ml    Physical Exam: General: Alert, awake, oriented x3.  Patient writhing in pain HEENT: [place]/AT PEERL, EOMI Neck: Trachea midline,  no masses, no thyromegal,y no JVD, no carotid bruit OROPHARYNX:  Moist, No exudate/ erythema/lesions.  Heart: Regular rate and rhythm, without murmurs, rubs, gallops, PMI non-displaced, no heaves or thrills on palpation.  Lungs: Clear to auscultation, no wheezing or rhonchi noted. No increased vocal fremitus resonant to percussion  Abdomen: Soft, nontender, nondistended, positive bowel sounds, no masses no hepatosplenomegaly noted..  Neuro: No focal neurological deficits noted cranial nerves II through XII grossly intact. DTRs 2+ bilaterally upper and lower extremities. Strength 5 out of 5 in bilateral upper and lower  extremities. Musculoskeletal: No warm swelling or erythema around joints, no spinal tenderness noted. Psychiatric: Patient alert and oriented x3, good insight and cognition, good recent to remote recall. Lymph node survey: No cervical axillary or inguinal lymphadenopathy noted.  Lab Results:  Basic Metabolic Panel:    Component Value Date/Time   NA 138 02/19/2019 0431   NA 137 11/29/2018 1224   K 3.7 02/19/2019 0431   CL 102 02/19/2019 0431   CO2 28 02/19/2019 0431   BUN <5 (L) 02/19/2019 0431   BUN 5 (L) 11/29/2018 1224   CREATININE 0.62 02/19/2019 0431   GLUCOSE 125 (H) 02/19/2019 0431   CALCIUM 9.1 02/19/2019 0431   CBC:    Component Value Date/Time   WBC 12.9 (H) 02/22/2019 1308   HGB 7.4 (L) 02/22/2019 1308   HGB 8.1 (L) 11/29/2018 1224   HCT 21.9 (L) 02/22/2019 1308   HCT 24.6 (L) 11/29/2018 1224   PLT 380 02/22/2019 1308   PLT 330 11/29/2018 1224   MCV 94.4 02/22/2019 1308   MCV 104 (H) 11/29/2018 1224   NEUTROABS 7.2 02/19/2019 0431   NEUTROABS 6.4 11/29/2018 1224   LYMPHSABS 5.0 (H) 02/19/2019 0431   LYMPHSABS 3.9 (H) 11/29/2018 1224   MONOABS 1.5 (H) 02/19/2019 0431   EOSABS 0.6 (H) 02/19/2019 0431   EOSABS 0.3 11/29/2018 1224   BASOSABS 0.1 02/19/2019 0431   BASOSABS 0.1 11/29/2018 1224    Recent Results (from the past 240 hour(s))  SARS CORONAVIRUS 2 (TAT 6-24 HRS) Nasopharyngeal Nasopharyngeal Swab  Status: None   Collection Time: 02/19/19  8:24 AM   Specimen: Nasopharyngeal Swab  Result Value Ref Range Status   SARS Coronavirus 2 NEGATIVE NEGATIVE Final    Comment: (NOTE) SARS-CoV-2 target nucleic acids are NOT DETECTED. The SARS-CoV-2 RNA is generally detectable in upper and lower respiratory specimens during the acute phase of infection. Negative results do not preclude SARS-CoV-2 infection, do not rule out co-infections with other pathogens, and should not be used as the sole basis for treatment or other patient management  decisions. Negative results must be combined with clinical observations, patient history, and epidemiological information. The expected result is Negative. Fact Sheet for Patients: HairSlick.no Fact Sheet for Healthcare Providers: quierodirigir.com This test is not yet approved or cleared by the [place] FDA and  has been authorized for detection and/or diagnosis of SARS-CoV-2 by FDA under an Emergency Use Authorization (EUA). This EUA will remain  in effect (meaning this test can be used) for the duration of the COVID-19 declaration under Section 56 4(b)(1) of the Act, 21 U.S.C. section 360bbb-3(b)(1), unless the authorization is terminated or revoked sooner. Performed at [facility], [address] [place], [place] [address]     Studies/Results: No results found.  Medications: Scheduled Meds: . enoxaparin (LOVENOX) injection  40 mg Subcutaneous Q24H  . folic acid  1 mg Oral Daily  . HYDROmorphone   Intravenous Q4H  . hydroxyurea  1,000 mg Oral Daily  . ketorolac  15 mg Intravenous Q6H   Continuous Infusions: PRN Meds:.acetaminophen, diphenhydrAMINE **OR** [DISCONTINUED] diphenhydrAMINE, naloxone **AND** sodium chloride flush, oxyCODONE, polyethylene glycol, promethazine  Consultants:  None  Procedures:  None  Antibiotics:  None  Assessment/Plan: Principal Problem:   Sickle cell pain crisis (HCC) Active Problems:   Tobacco abuse   Leukocytosis   Chronic pain   Polysubstance abuse (HCC)  Sickle cell disease with pain crisis: Continuing to wean IV Dilaudid PCA.  Settings of 0.5 mg, 10-minute lockout, and 1.5 mg/h Oxycodone 15 mg every 4 hours as needed for severe breakthrough pain Toradol 15 mg IV every 6 hours for total of 5 days. Monitor vital signs closely and reevaluate pain scale regularly Continue to refrain from clinician assisted IV Dilaudid boluses  Leukocytosis: WBCs improving.   Patient afebrile.  No obvious signs of infection or inflammation.  Suspected to be reactive. Follow CBC  Sickle cell anemia: Hemoglobin 7.4 today, consistent with patient's baseline.  There is no clinical indication for blood transfusion at this time.  Continue to monitor closely.  Chronic pain syndrome: Continue home medications   History of polysubstance abuse: Patient urine drug screen is pending.  He has yet to provide a urine sample.  Tobacco dependence: Counseled at length on admission.  Patient continues to vape to assist with smoking cessation, he declines nicotine patches.    Code Status: Full Code Family Communication: N/A Disposition Plan: Not yet ready for discharge  Denese Mentink Rennis Petty  APRN, MSN, FNP-C [facility] [address] [place], [place] [address] [phone] If 5PM-7AM, please contact night-coverage.  02/22/2019, 7:37 PM  LOS: 2 days

## 2019-02-22 NOTE — Progress Notes (Signed)
MEWS score briefly turned yellow- RR incorrectly entered as 13 (was 16 but hit 3 instead of 6). Corrected entry and mews score remains at 1. Mick Sell RN

## 2019-02-23 LAB — PREPARE RBC (CROSSMATCH)

## 2019-02-23 LAB — BASIC METABOLIC PANEL
Anion gap: 7 (ref 5–15)
BUN: 7 mg/dL (ref 6–20)
CO2: 27 mmol/L (ref 22–32)
Calcium: 8.9 mg/dL (ref 8.9–10.3)
Chloride: 106 mmol/L (ref 98–111)
Creatinine, Ser: 0.56 mg/dL — ABNORMAL LOW (ref 0.61–1.24)
GFR calc Af Amer: >60 mL/min (ref >60)
GFR calc non Af Amer: >60 mL/min (ref >60)
Glucose, Bld: 112 mg/dL — ABNORMAL HIGH (ref 70–99)
Potassium: 4.1 mmol/L (ref 3.5–5.1)
Sodium: 140 mmol/L (ref 135–145)

## 2019-02-23 LAB — CBC
HCT: 19.6 % — ABNORMAL LOW (ref 39.0–52.0)
Hemoglobin: 6.6 g/dL — CL (ref 13.0–17.0)
MCH: 32.2 pg (ref 26.0–34.0)
MCHC: 33.7 g/dL (ref 30.0–36.0)
MCV: 95.6 fL (ref 80.0–100.0)
Platelets: 320 10*3/uL (ref 150–400)
RBC: 2.05 MIL/uL — ABNORMAL LOW (ref 4.22–5.81)
RDW: 17.2 % — ABNORMAL HIGH (ref 11.5–15.5)
WBC: 14.8 10*3/uL — ABNORMAL HIGH (ref 4.0–10.5)
nRBC: 0.8 % — ABNORMAL HIGH (ref 0.0–0.2)

## 2019-02-23 LAB — HEMOGLOBIN AND HEMATOCRIT, BLOOD
HCT: 23.7 % — ABNORMAL LOW (ref 39.0–52.0)
Hemoglobin: 7.7 g/dL — ABNORMAL LOW (ref 13.0–17.0)

## 2019-02-23 MED ORDER — OXYCODONE HCL 5 MG PO TABS
15.0000 mg | ORAL_TABLET | ORAL | Status: DC
  Administered 2019-02-23 – 2019-02-24 (×6): 15 mg via ORAL
  Filled 2019-02-23 (×6): qty 3

## 2019-02-23 MED ORDER — ACETAMINOPHEN 325 MG PO TABS
650.0000 mg | ORAL_TABLET | Freq: Once | ORAL | Status: AC
  Administered 2019-02-23: 15:00:00 650 mg via ORAL
  Filled 2019-02-23: qty 2

## 2019-02-23 MED ORDER — OXYCODONE HCL ER 10 MG PO T12A
10.0000 mg | EXTENDED_RELEASE_TABLET | Freq: Two times a day (BID) | ORAL | Status: DC
  Administered 2019-02-23 – 2019-02-24 (×3): 10 mg via ORAL
  Filled 2019-02-23 (×3): qty 1

## 2019-02-23 MED ORDER — DIPHENHYDRAMINE HCL 25 MG PO CAPS
25.0000 mg | ORAL_CAPSULE | Freq: Once | ORAL | Status: AC
  Administered 2019-02-23: 25 mg via ORAL
  Filled 2019-02-23: qty 1

## 2019-02-23 MED ORDER — HYDROMORPHONE 1 MG/ML IV SOLN
INTRAVENOUS | Status: DC
  Administered 2019-02-23: 4 mg via INTRAVENOUS
  Administered 2019-02-23: 2 mg via INTRAVENOUS
  Administered 2019-02-24: 3.5 mg via INTRAVENOUS
  Administered 2019-02-24: 4.5 mg via INTRAVENOUS
  Administered 2019-02-24: 30 mg via INTRAVENOUS
  Filled 2019-02-23 (×2): qty 30

## 2019-02-23 MED ORDER — SODIUM CHLORIDE 0.9% IV SOLUTION: Freq: Once | INTRAVENOUS | Status: AC

## 2019-02-23 NOTE — Progress Notes (Signed)
Subjective: Matthew Shannon, is a 29 year old male with a medical history significant for sickle cell disease, chronic pain syndrome, opiate dependence and tolerance, history of anemia of chronic disease, and history of polysubstance abuse was admitted for sickle cell pain crisis.  Patient's hemoglobin is 6.6 on today, which is decreased from his baseline of 7.0-8.0 g/dL.  Patient endorses fatigue.  He denies headache, dizziness, chest pain, shortness of breath, nausea, vomiting, or diarrhea.  Patient continues to complain of pain localized to low back.  Pain intensity is 7/10 despite IV Dilaudid PCA.  He characterizes pain as constant and aching.  Patient has not attempted to get out of bed without assistance.   Objective:  Vital signs in last 24 hours:  Vitals:   02/23/19 0342 02/23/19 0534 02/23/19 0805 02/23/19 0949  BP:  (!) 105/59  106/60  Pulse:  72  73  Resp: 18 17 14 14   Temp:  98.7 F (37.1 C)  98.2 F (36.8 C)  TempSrc:  Oral  Oral  SpO2: 98% 95% 96% 97%  Weight:  59.5 kg    Height:        Intake/Output from previous day:   Intake/Output Summary (Last 24 hours) at 02/23/2019 1139 Last data filed at 02/23/2019 0500 Gross per 24 hour  Intake 240 ml  Output 650 ml  Net -410 ml    Physical Exam: General: Alert, awake, oriented x3, in no acute distress.  HEENT: [place]/AT PEERL, EOMI Neck: Trachea midline,  no masses, no thyromegal,y no JVD, no carotid bruit OROPHARYNX:  Moist, No exudate/ erythema/lesions.  Heart: Regular rate and rhythm, without murmurs, rubs, gallops, PMI non-displaced, no heaves or thrills on palpation.  Lungs: Clear to auscultation, no wheezing or rhonchi noted. No increased vocal fremitus resonant to percussion  Abdomen: Soft, nontender, nondistended, positive bowel sounds, no masses no hepatosplenomegaly noted..  Neuro: No focal neurological deficits noted cranial nerves II through XII grossly intact. DTRs 2+ bilaterally upper and lower extremities.  Strength 5 out of 5 in bilateral upper and lower extremities. Musculoskeletal: No warm swelling or erythema around joints, no spinal tenderness noted. Psychiatric: Patient alert and oriented x3, good insight and cognition, good recent to remote recall. Lymph node survey: No cervical axillary or inguinal lymphadenopathy noted.  Lab Results:  Basic Metabolic Panel:    Component Value Date/Time   NA 140 02/23/2019 0659   NA 137 11/29/2018 1224   K 4.1 02/23/2019 0659   CL 106 02/23/2019 0659   CO2 27 02/23/2019 0659   BUN 7 02/23/2019 0659   BUN 5 (L) 11/29/2018 1224   CREATININE 0.56 (L) 02/23/2019 0659   GLUCOSE 112 (H) 02/23/2019 0659   CALCIUM 8.9 02/23/2019 0659   CBC:    Component Value Date/Time   WBC 14.8 (H) 02/23/2019 0659   HGB 6.6 (LL) 02/23/2019 0659   HGB 8.1 (L) 11/29/2018 1224   HCT 19.6 (L) 02/23/2019 0659   HCT 24.6 (L) 11/29/2018 1224   PLT 320 02/23/2019 0659   PLT 330 11/29/2018 1224   MCV 95.6 02/23/2019 0659   MCV 104 (H) 11/29/2018 1224   NEUTROABS 7.2 02/19/2019 0431   NEUTROABS 6.4 11/29/2018 1224   LYMPHSABS 5.0 (H) 02/19/2019 0431   LYMPHSABS 3.9 (H) 11/29/2018 1224   MONOABS 1.5 (H) 02/19/2019 0431   EOSABS 0.6 (H) 02/19/2019 0431   EOSABS 0.3 11/29/2018 1224   BASOSABS 0.1 02/19/2019 0431   BASOSABS 0.1 11/29/2018 1224    Recent Results (from the past 240  hour(s))  SARS CORONAVIRUS 2 (TAT 6-24 HRS) Nasopharyngeal Nasopharyngeal Swab     Status: None   Collection Time: 02/19/19  8:24 AM   Specimen: Nasopharyngeal Swab  Result Value Ref Range Status   SARS Coronavirus 2 NEGATIVE NEGATIVE Final    Comment: (NOTE) SARS-CoV-2 target nucleic acids are NOT DETECTED. The SARS-CoV-2 RNA is generally detectable in upper and lower respiratory specimens during the acute phase of infection. Negative results do not preclude SARS-CoV-2 infection, do not rule out co-infections with other pathogens, and should not be used as the sole basis for  treatment or other patient management decisions. Negative results must be combined with clinical observations, patient history, and epidemiological information. The expected result is Negative. Fact Sheet for Patients: SugarRoll.be Fact Sheet for Healthcare Providers: https://www.woods-mathews.com/ This test is not yet approved or cleared by the [place] FDA and  has been authorized for detection and/or diagnosis of SARS-CoV-2 by FDA under an Emergency Use Authorization (EUA). This EUA will remain  in effect (meaning this test can be used) for the duration of the COVID-19 declaration under Section 56 4(b)(1) of the Act, 21 U.S.C. section 360bbb-3(b)(1), unless the authorization is terminated or revoked sooner. Performed at [facility], [place] [address] [place], [place] [address]     Studies/Results: No results found.  Medications: Scheduled Meds: . sodium chloride   Intravenous Once  . acetaminophen  650 mg Oral Once  . diphenhydrAMINE  25 mg Oral Once  . enoxaparin (LOVENOX) injection  40 mg Subcutaneous Q24H  . folic acid  1 mg Oral Daily  . HYDROmorphone   Intravenous Q4H  . hydroxyurea  1,000 mg Oral Daily  . ketorolac  15 mg Intravenous Q6H  . oxyCODONE  15 mg Oral Q4H while awake  . oxyCODONE  10 mg Oral Q12H   Continuous Infusions: PRN Meds:.acetaminophen, diphenhydrAMINE **OR** [DISCONTINUED] diphenhydrAMINE, naloxone **AND** sodium chloride flush, polyethylene glycol, promethazine  Consultants:  None  Procedures:  None  Antibiotics:  None  Assessment/Plan: Principal Problem:   Sickle cell pain crisis ([place]) Active Problems:   Tobacco abuse   Leukocytosis   Chronic pain   Polysubstance abuse (HCC)  Sickle cell disease with pain crisis: Weaning IV Dilaudid PCA.  Settings changed to 0.5 mg, 10-minute lockout, and 1 mg/h. Oxycodone 15 mg every 4 hours while awake OxyContin 10 mg every 12 hours for  greater pain control Continue to monitor vitals closely Reevaluate pain scale regularly  Leukocytosis: Improving.  Patient afebrile.  No signs of infection or inflammation.  Continue to follow CBC.  Sickle cell anemia: Hemoglobin decreased to 6.6, which is below patient's baseline.  Transfuse 1 unit of PRBCs.  Follow CBC in a.m.  Chronic pain syndrome: Continue home medications.  History of polysubstance abuse: Reviewed UDS, no illicit substances.  Tobacco dependence: Discussed at length on admission.  Patient is attempting smoking cessation by vaping.  He is not interested in nicotine patches.  Code Status: Full Code Family Communication: N/A Disposition Plan: Not yet ready for discharge  [place], MSN, FNP-C Patient [place] [address] [place], [place] [address] [phone]   If 5PM-7AM, please contact night-coverage.  02/23/2019, 11:39 AM  LOS: 3 days

## 2019-02-23 NOTE — Progress Notes (Signed)
PHARMACY BRIEF NOTE: HYDROXYUREA   By [facility] Health policy, hydroxyurea is automatically held when any of the following laboratory values occur:  ANC < 2 K  Pltc < 80K in sickle-cell patients; < 100K in other patients  Hgb <= 6 in sickle-cell patients; < 8 in other patients  Reticulocytes < 80K when Hgb < 9  Hydroxyurea has been held (discontinued from profile) per policy.   Hg 6.6  Herby Abraham, Pharm.D 02/23/2019 1:53 PM

## 2019-02-24 LAB — TYPE AND SCREEN
ABO/RH(D): O POS
Antibody Screen: NEGATIVE
Donor AG Type: NEGATIVE
Unit division: 0

## 2019-02-24 LAB — URINE CULTURE: Culture: NO GROWTH

## 2019-02-24 LAB — BPAM RBC
Blood Product Expiration Date: 202101282359
ISSUE DATE / TIME: 202101141537
Unit Type and Rh: 5100

## 2019-02-24 MED ORDER — OXYCODONE HCL ER 10 MG PO T12A: 10.0000 mg | EXTENDED_RELEASE_TABLET | Freq: Two times a day (BID) | ORAL | 0 refills | Status: DC

## 2019-02-24 MED ORDER — IBUPROFEN 800 MG PO TABS: 800.0000 mg | ORAL_TABLET | Freq: Three times a day (TID) | ORAL | 0 refills | Status: AC | PRN

## 2019-02-24 NOTE — Discharge Summary (Signed)
Physician Discharge Summary  Matthew Shannon WNU:[id number] DOB: [birth year] DOA: 02/20/2019  PCP: [name], MD  Admit date: 02/20/2019  Discharge date: 02/24/2019  Discharge Diagnoses:  Principal Problem:   Sickle cell pain crisis (HCC) Active Problems:   Tobacco abuse   Leukocytosis   Chronic pain   Polysubstance abuse (HCC)   Discharge Condition: Stable  Disposition:  Pt is discharged home in good condition and is to follow up with [name], MD this week to have labs evaluated. Matthew Shannon is instructed to increase activity slowly and balance with rest for the next few days, and use prescribed medication to complete treatment of pain  Diet: Regular Wt Readings from Last 3 Encounters:  02/24/19 60.9 kg  02/19/19 61.2 kg  02/18/19 61.2 kg    History of present illness:  Matthew Shannon, a 29 year old male with a medical history significant for sickle cell disease, chronic pain syndrome, opiate dependence and tolerance, history of polysubstance abuse, history of tobacco dependence, and anemia of chronic disease presented to sickle cell day infusion center complaining of low back and lower extremity pain.  Pain is consistent with his typical crisis.  Patient had been treated and evaluated in the ER over the past several days for this problem without complete resolution.  He also stated that he had been taking home medications consistently without sustained relief.  Current pain intensity is 8/10 characterized as constant and aching.  Patient states that he has been unable to get comfortable.  He has not identified any provocative factors concerning current crisis.  Patient denies headache, chest pain, paresthesias, persistent cough, shortness of breath, sore throat, urinary symptoms, constipation, or diarrhea.  No sick contacts, recent travel, or exposure to COVID-19.  Sickle cell day infusion center course: WBCs 14.2.  Hemoglobin 7.6, consistent with patient's  baseline.  Platelets slightly elevated at 436.  Total bilirubin elevated at 4.2.  T 98 F, BP 101/64, pulse 81, RR 12, and oxygen saturation is 95% on RA.  Patient's pain persists despite IV Dilaudid PCA, oxycodone, IV Toradol, and IV fluids.  Admit to [facility] for management of pain crisis.  Hospital Course:  Sickle cell disease with pain crisis: Patient was admitted for sickle cell pain crisis and managed appropriately with IVF, IV Dilaudid via PCA and IV Toradol, as well as other adjunct therapies per sickle cell pain management protocols.  IV Dilaudid PCA weaned appropriately.  OxyContin 10 mg every 12 hours was initiated for greater pain control.  OxyContin 10 mg every 12 hours #14 sent to patient's pharmacy to complete treatment.  Patient advised that this is not a long-term medication and he is to resume routine medication regimen. Patient is to follow-up with PCP for medication management. Recommend titration of oxycodone 30 mg every 4 hours as his pain improves.  MME's currently 270/day.  Patient may warrant drug rehabilitation going forward to assist with titration. Patient has a history of anemia of chronic disease.  His baseline hemoglobin is 7.0-8.0 g/dL.  Hemoglobin decreased to 6.4 during admission, patient was transfused 1 unit PRBCs without complication.  Hemoglobin returned to baseline at 7.7 prior to discharge. History of tobacco dependence.  Patient was counseled at length.  He is vaping to assist with smoking cessation.  Discussed the dangers of vaping, including pneumonitis.  Patient expressed understanding.  He is not interested and other modes of treatment at this time.  Patient advised to follow-up with PCP for medication management and to repeat  CBC and CMP in 1 week.  Patient also wants to discuss getting a Port-A-Cath placed, will defer to his PCP.  Patient alert, oriented, and ambulating without assistance.  He is afebrile and maintaining oxygen saturation above  90%. Patient was discharged home today in a hemodynamically stable condition.   Discharge Exam: Vitals:   02/24/19 0957 02/24/19 1011  BP: 107/68   Pulse: 82   Resp: 18 13  Temp: 98.6 F (37 C)   SpO2: 94% 92%   Vitals:   02/24/19 0405 02/24/19 0502 02/24/19 0957 02/24/19 1011  BP:  105/65 107/68   Pulse:  78 82   Resp: 16 17 18 13   Temp:  98.4 F (36.9 C) 98.6 F (37 C)   TempSrc:  Oral Oral   SpO2: 98% 95% 94% 92%  Weight:  60.9 kg    Height:        General appearance : Awake, alert, not in any distress. Speech Clear. Not toxic looking HEENT: Atraumatic and Normocephalic, pupils equally reactive to light and accomodation Neck: Supple, no JVD. No cervical lymphadenopathy.  Chest: Good air entry bilaterally, no added sounds  CVS: S1 S2 regular, no murmurs.  Abdomen: Bowel sounds present, Non tender and not distended with no gaurding, rigidity or rebound. Extremities: B/L Lower Ext shows no edema, both legs are warm to touch Neurology: Awake alert, and oriented X 3, CN II-XII intact, Non focal Skin: No Rash  Discharge Instructions   Allergies as of 02/24/2019      Reactions   Ceftriaxone Shortness Of Breath, Itching, Other (See Comments), Cough   Zosyn [piperacillin Sod-tazobactam So] Shortness Of Breath   SOB/rash/N/V Has patient had a PCN reaction causing immediate rash, facial/tongue/throat swelling, SOB or lightheadedness with hypotension: Y Has patient had a PCN reaction causing severe rash involving mucus membranes or skin necrosis: N Has patient had a PCN reaction that required hospitalization: Y Has patient had a PCN reaction occurring within the last 10 years: Y If all of the above answers are "NO", then may proceed with Cephalosporin use.   Azithromycin Itching      Medication List    TAKE these medications   acetaminophen 325 MG tablet Commonly known as: TYLENOL Take 650 mg by mouth every 6 (six) hours as needed for mild pain.   folic acid 1 MG  tablet Commonly known as: FOLVITE Take 1 tablet (1 mg total) by mouth daily.   hydroxyurea 500 MG capsule Commonly known as: HYDREA Take 2 capsules (1,000 mg total) by mouth daily. May take with food to minimize GI side effects.   ibuprofen 800 MG tablet Commonly known as: ADVIL Take 1 tablet (800 mg total) by mouth every 8 (eight) hours as needed.   ondansetron 4 MG disintegrating tablet Commonly known as: Zofran ODT Take 1 tablet (4 mg total) by mouth every 8 (eight) hours as needed. What changed: reasons to take this   oxycodone 30 MG immediate release tablet Commonly known as: ROXICODONE Take 1 tablet (30 mg total) by mouth every 4 (four) hours as needed for up to 15 days for pain. What changed: Another medication with the same name was added. Make sure you understand how and when to take each.   oxyCODONE 10 mg 12 hr tablet Commonly known as: OXYCONTIN Take 1 tablet (10 mg total) by mouth every 12 (twelve) hours. What changed: You were already taking a medication with the same name, and this prescription was added. Make sure you understand how  and when to take each.   promethazine 12.5 MG tablet Commonly known as: PHENERGAN Take 1 tablet (12.5 mg total) by mouth every 6 (six) hours as needed for nausea or vomiting.   promethazine 25 MG suppository Commonly known as: PHENERGAN Place 1 suppository (25 mg total) rectally every 6 (six) hours as needed for nausea or vomiting.       The results of significant diagnostics from this hospitalization (including imaging, microbiology, ancillary and laboratory) are listed below for reference.    Significant Diagnostic Studies: DG Chest 2 View  Result Date: 02/18/2019 CLINICAL DATA:  Sickle cell crisis. Back pain. Shortness of breath. EXAM: CHEST - 2 VIEW COMPARISON:  February 04, 2019 FINDINGS: New opacity seen in the right base. Mild patchy opacity in left base. The heart, hila, mediastinum, lungs, and pleura are otherwise  unremarkable. IMPRESSION: New opacities in the bases, right greater than left are nonspecific but may represent pneumonia. Recommend clinical correlation and short-term follow-up to ensure resolution. Electronically Signed   By: Gerome Sam III M.D   On: 02/18/2019 11:38   DG Chest 2 View  Result Date: 02/04/2019 CLINICAL DATA:  Chest pain with nausea and vomiting. Sickle cell disease. EXAM: CHEST - 2 VIEW COMPARISON:  01/24/2019 and 09/12/2018 FINDINGS: Lungs are adequately inflated with mild chronic bibasilar interstitial changes. No effusion or focal lobar consolidation. Cardiomediastinal silhouette and remainder of the exam is unchanged. IMPRESSION: 1.  No acute cardiopulmonary disease. 2.  Chronic bibasilar interstitial changes. Electronically Signed   By: Elberta Fortis M.D.   On: 02/04/2019 08:11    Microbiology: Recent Results (from the past 240 hour(s))  SARS CORONAVIRUS 2 (TAT 6-24 HRS) Nasopharyngeal Nasopharyngeal Swab     Status: None   Collection Time: 02/19/19  8:24 AM   Specimen: Nasopharyngeal Swab  Result Value Ref Range Status   SARS Coronavirus 2 NEGATIVE NEGATIVE Final    Comment: (NOTE) SARS-CoV-2 target nucleic acids are NOT DETECTED. The SARS-CoV-2 RNA is generally detectable in upper and lower respiratory specimens during the acute phase of infection. Negative results do not preclude SARS-CoV-2 infection, do not rule out co-infections with other pathogens, and should not be used as the sole basis for treatment or other patient management decisions. Negative results must be combined with clinical observations, patient history, and epidemiological information. The expected result is Negative. Fact Sheet for Patients: HairSlick.no Fact Sheet for Healthcare Providers: quierodirigir.com This test is not yet approved or cleared by the [place] FDA and  has been authorized for detection and/or diagnosis of  SARS-CoV-2 by FDA under an Emergency Use Authorization (EUA). This EUA will remain  in effect (meaning this test can be used) for the duration of the COVID-19 declaration under Section 56 4(b)(1) of the Act, 21 U.S.C. section 360bbb-3(b)(1), unless the authorization is terminated or revoked sooner. Performed at [facility], [address], [place], [place] [address]   Urine Culture     Status: None   Collection Time: 02/22/19  8:25 PM   Specimen: Urine, Clean Catch  Result Value Ref Range Status   Specimen Description   Final    URINE, CLEAN CATCH Performed at [facility], [address], [place], [place] [address]    Special Requests   Final    NONE Performed at [facility], [address], [place], [place] [address]    Culture   Final    NO GROWTH Performed at [facility], [address], [place], [place] [address]    Report Status 02/24/2019 FINAL  Final     Labs: Basic Metabolic Panel: Recent Labs  Lab 02/18/19 0609 02/18/19 0609 02/19/19 0431 02/23/19 0659  NA 137  --  138 140  K 3.8   < > 3.7 4.1  CL 106  --  102 106  CO2 23  --  28 27  GLUCOSE 105*  --  125* 112*  BUN 6  --  <5* 7  CREATININE 0.64  --  0.62 0.56*  CALCIUM 8.4*  --  9.1 8.9   < > = values in this interval not displayed.   Liver Function Tests: Recent Labs  Lab 02/19/19 0431  AST 24  ALT 11  ALKPHOS 73  BILITOT 4.2*  PROT 7.3  ALBUMIN 3.6   No results for input(s): LIPASE, AMYLASE in the last 168 hours. No results for input(s): AMMONIA in the last 168 hours. CBC: Recent Labs  Lab 02/18/19 0609 02/18/19 0609 02/19/19 0431 02/19/19 0431 02/20/19 1015 02/21/19 0534 02/22/19 1308 02/23/19 0659 02/23/19 2053  WBC 14.0*   < > 14.6*  --  14.2* 18.0* 12.9* 14.8*  --   NEUTROABS 6.9  --  7.2  --   --   --   --   --   --   HGB 7.0*   < > 7.2*   < > 7.6* 7.4* 7.4* 6.6* 7.7*  HCT 20.8*   < > 21.7*   < > 23.0* 22.5* 21.9* 19.6*  23.7*  MCV 95.4   < > 97.7  --  97.9 99.1 94.4 95.6  --   PLT 360   < > 426*  --  436* 354 380 320  --    < > = values in this interval not displayed.   Cardiac Enzymes: No results for input(s): CKTOTAL, CKMB, CKMBINDEX, TROPONINI in the last 168 hours. BNP: Invalid input(s): POCBNP CBG: No results for input(s): GLUCAP in the last 168 hours.  Time coordinating discharge: 50 minutes  Signed:  Donia Pounds  APRN, MSN, FNP-C Patient [place] [address] [place], [place] [address] [phone]  [facility] 02/24/2019, 11:56 AM

## 2019-02-24 NOTE — Discharge Instructions (Signed)
Will continue Oxycontin 10 mg every 12 hours for a total of 7 days. Please refrain from calling office and requesting this prescription. This is not long term.  You need to be titrated down from Oxycodone 30   Sickle Cell Anemia, Adult  Sickle cell anemia is a condition where your red blood cells are shaped like sickles. Red blood cells carry oxygen through the body. Sickle-shaped cells do not live as long as normal red blood cells. They also clump together and block blood from flowing through the blood vessels. This prevents the body from getting enough oxygen. Sickle cell anemia causes organ damage and pain. It also increases the risk of infection. Follow these instructions at home: Medicines  Take over-the-counter and prescription medicines only as told by your doctor.  If you were prescribed an antibiotic medicine, take it as told by your doctor. Do not stop taking the antibiotic even if you start to feel better.  If you develop a fever, do not take medicines to lower the fever right away. Tell your doctor about the fever. Managing pain, stiffness, and swelling  Try these methods to help with pain: ? Use a heating pad. ? Take a warm bath. ? Distract yourself, such as by watching TV. Eating and drinking  Drink enough fluid to keep your pee (urine) clear or pale yellow. Drink more in hot weather and during exercise.  Limit or avoid alcohol.  Eat a healthy diet. Eat plenty of fruits, vegetables, whole grains, and lean protein.  Take vitamins and supplements as told by your doctor. Traveling  When traveling, keep these with you: ? Your medical information. ? The names of your doctors. ? Your medicines.  If you need to take an airplane, talk to your doctor first. Activity  Rest often.  Avoid exercises that make your heart beat much faster, such as jogging. General instructions  Do not use products that have nicotine or tobacco, such as cigarettes and e-cigarettes. If you  need help quitting, ask your doctor.  Consider wearing a medical alert bracelet.  Avoid being in high places (high altitudes), such as mountains.  Avoid very hot or cold temperatures.  Avoid places where the temperature changes a lot.  Keep all follow-up visits as told by your doctor. This is important. Contact a doctor if:  A joint hurts.  Your feet or hands hurt or swell.  You feel tired (fatigued). Get help right away if:  You have symptoms of infection. These include: ? Fever. ? Chills. ? Being very tired. ? Irritability. ? Poor eating. ? Throwing up (vomiting).  You feel dizzy or faint.  You have new stomach pain, especially on the left side.  You have a an erection (priapism) that lasts more than 4 hours.  You have numbness in your arms or legs.  You have a hard time moving your arms or legs.  You have trouble talking.  You have pain that does not go away when you take medicine.  You are short of breath.  You are breathing fast.  You have a long-term cough.  You have pain in your chest.  You have a bad headache.  You have a stiff neck.  Your stomach looks bloated even though you did not eat much.  Your skin is pale.  You suddenly cannot see well. Summary  Sickle cell anemia is a condition where your red blood cells are shaped like sickles.  Follow your doctor's advice on ways to manage pain, food to  eat, activities to do, and steps to take for safe travel.  Get medical help right away if you have any signs of infection, such as a fever. This information is not intended to replace advice given to you by your health care provider. Make sure you discuss any questions you have with your health care provider. Document Revised: 05/20/2018 Document Reviewed: 03/03/2016 Elsevier Patient Education  [place].

## 2019-02-25 ENCOUNTER — Emergency Department (HOSPITAL_BASED_OUTPATIENT_CLINIC_OR_DEPARTMENT_OTHER): Payer: Medicaid Other

## 2019-02-25 ENCOUNTER — Other Ambulatory Visit: Payer: Self-pay

## 2019-02-25 ENCOUNTER — Inpatient Hospital Stay (HOSPITAL_BASED_OUTPATIENT_CLINIC_OR_DEPARTMENT_OTHER)
Admission: EM | Admit: 2019-02-25 | Discharge: 2019-02-28 | DRG: 812 | Disposition: A | Discharge status: Home / Self Care | Payer: Medicaid Other | Attending: Internal Medicine | Admitting: Internal Medicine

## 2019-02-25 ENCOUNTER — Encounter (HOSPITAL_BASED_OUTPATIENT_CLINIC_OR_DEPARTMENT_OTHER): Payer: Self-pay | Admitting: Emergency Medicine

## 2019-02-25 DIAGNOSIS — Z825 Family history of asthma and other chronic lower respiratory diseases: Secondary | ICD-10-CM | POA: Diagnosis not present

## 2019-02-25 DIAGNOSIS — G8929 Other chronic pain: Secondary | ICD-10-CM | POA: Diagnosis present

## 2019-02-25 DIAGNOSIS — G894 Chronic pain syndrome: Secondary | ICD-10-CM | POA: Diagnosis present

## 2019-02-25 DIAGNOSIS — Z20822 Contact with and (suspected) exposure to covid-19: Secondary | ICD-10-CM | POA: Diagnosis present

## 2019-02-25 DIAGNOSIS — Z765 Malingerer [conscious simulation]: Secondary | ICD-10-CM | POA: Diagnosis not present

## 2019-02-25 DIAGNOSIS — Z832 Family history of diseases of the blood and blood-forming organs and certain disorders involving the immune mechanism: Secondary | ICD-10-CM | POA: Diagnosis not present

## 2019-02-25 DIAGNOSIS — Z880 Allergy status to penicillin: Secondary | ICD-10-CM

## 2019-02-25 DIAGNOSIS — F1910 Other psychoactive substance abuse, uncomplicated: Secondary | ICD-10-CM | POA: Diagnosis present

## 2019-02-25 DIAGNOSIS — Z888 Allergy status to other drugs, medicaments and biological substances status: Secondary | ICD-10-CM

## 2019-02-25 DIAGNOSIS — F17210 Nicotine dependence, cigarettes, uncomplicated: Secondary | ICD-10-CM | POA: Diagnosis present

## 2019-02-25 DIAGNOSIS — F17209 Nicotine dependence, unspecified, with unspecified nicotine-induced disorders: Secondary | ICD-10-CM | POA: Diagnosis not present

## 2019-02-25 DIAGNOSIS — D5700 Hb-SS disease with crisis, unspecified: Secondary | ICD-10-CM | POA: Diagnosis present

## 2019-02-25 DIAGNOSIS — Z833 Family history of diabetes mellitus: Secondary | ICD-10-CM

## 2019-02-25 DIAGNOSIS — Z881 Allergy status to other antibiotic agents status: Secondary | ICD-10-CM | POA: Diagnosis not present

## 2019-02-25 LAB — CBC WITH DIFFERENTIAL/PLATELET
Abs Immature Granulocytes: 0.12 10*3/uL — ABNORMAL HIGH (ref 0.00–0.07)
Basophils Absolute: 0.1 10*3/uL (ref 0.0–0.1)
Basophils Relative: 1 %
Eosinophils Absolute: 0.4 10*3/uL (ref 0.0–0.5)
Eosinophils Relative: 2 %
HCT: 22.6 % — ABNORMAL LOW (ref 39.0–52.0)
Hemoglobin: 7.5 g/dL — ABNORMAL LOW (ref 13.0–17.0)
Immature Granulocytes: 1 %
Lymphocytes Relative: 14 %
Lymphs Abs: 2.2 10*3/uL (ref 0.7–4.0)
MCH: 31.4 pg (ref 26.0–34.0)
MCHC: 33.2 g/dL (ref 30.0–36.0)
MCV: 94.6 fL (ref 80.0–100.0)
Monocytes Absolute: 1.2 10*3/uL — ABNORMAL HIGH (ref 0.1–1.0)
Monocytes Relative: 8 %
Neutro Abs: 11.7 10*3/uL — ABNORMAL HIGH (ref 1.7–7.7)
Neutrophils Relative %: 74 %
Platelets: 305 10*3/uL (ref 150–400)
RBC: 2.39 MIL/uL — ABNORMAL LOW (ref 4.22–5.81)
RDW: 19.6 % — ABNORMAL HIGH (ref 11.5–15.5)
WBC: 15.6 10*3/uL — ABNORMAL HIGH (ref 4.0–10.5)
nRBC: 0.6 % — ABNORMAL HIGH (ref 0.0–0.2)

## 2019-02-25 LAB — COMPREHENSIVE METABOLIC PANEL
ALT: 24 U/L (ref 0–44)
AST: 45 U/L — ABNORMAL HIGH (ref 15–41)
Albumin: 4 g/dL (ref 3.5–5.0)
Alkaline Phosphatase: 67 U/L (ref 38–126)
Anion gap: 7 (ref 5–15)
BUN: 5 mg/dL — ABNORMAL LOW (ref 6–20)
CO2: 28 mmol/L (ref 22–32)
Calcium: 9.1 mg/dL (ref 8.9–10.3)
Chloride: 103 mmol/L (ref 98–111)
Creatinine, Ser: 0.44 mg/dL — ABNORMAL LOW (ref 0.61–1.24)
GFR calc Af Amer: >60 mL/min (ref >60)
GFR calc non Af Amer: >60 mL/min (ref >60)
Glucose, Bld: 100 mg/dL — ABNORMAL HIGH (ref 70–99)
Potassium: 4 mmol/L (ref 3.5–5.1)
Sodium: 138 mmol/L (ref 135–145)
Total Bilirubin: 4.9 mg/dL — ABNORMAL HIGH (ref 0.3–1.2)
Total Protein: 7.7 g/dL (ref 6.5–8.1)

## 2019-02-25 LAB — RETICULOCYTES
Immature Retic Fract: 36.4 % — ABNORMAL HIGH (ref 2.3–15.9)
RBC.: 2.28 MIL/uL — ABNORMAL LOW (ref 4.22–5.81)
Retic Count, Absolute: 249.3 10*3/uL — ABNORMAL HIGH (ref 19.0–186.0)
Retic Ct Pct: 10.9 % — ABNORMAL HIGH (ref 0.4–3.1)

## 2019-02-25 LAB — LIPASE, BLOOD: Lipase: 17 U/L (ref 11–51)

## 2019-02-25 LAB — TROPONIN I (HIGH SENSITIVITY)
Troponin I (High Sensitivity): 5 ng/L (ref <18)
Troponin I (High Sensitivity): 5 ng/L (ref <18)

## 2019-02-25 LAB — SARS CORONAVIRUS 2 (TAT 6-24 HRS): SARS Coronavirus 2: NEGATIVE

## 2019-02-25 MED ORDER — MORPHINE SULFATE (PF) 4 MG/ML IV SOLN
4.0000 mg | Freq: Once | INTRAVENOUS | Status: AC
  Administered 2019-02-25: 4 mg via INTRAVENOUS
  Filled 2019-02-25: qty 1

## 2019-02-25 MED ORDER — KETOROLAC TROMETHAMINE 30 MG/ML IJ SOLN
30.0000 mg | INTRAMUSCULAR | Status: AC
  Administered 2019-02-25: 30 mg via INTRAVENOUS
  Filled 2019-02-25: qty 1

## 2019-02-25 MED ORDER — DIPHENHYDRAMINE HCL 50 MG/ML IJ SOLN
25.0000 mg | Freq: Once | INTRAMUSCULAR | Status: AC
  Administered 2019-02-25: 25 mg via INTRAVENOUS
  Filled 2019-02-25: qty 1

## 2019-02-25 MED ORDER — OXYCODONE HCL ER 10 MG PO T12A
10.0000 mg | EXTENDED_RELEASE_TABLET | Freq: Two times a day (BID) | ORAL | Status: DC
  Administered 2019-02-25 – 2019-02-28 (×6): 10 mg via ORAL
  Filled 2019-02-25 (×6): qty 1

## 2019-02-25 MED ORDER — DIPHENHYDRAMINE HCL 25 MG PO CAPS: 25.0000 mg | ORAL_CAPSULE | ORAL | Status: DC | PRN

## 2019-02-25 MED ORDER — SENNOSIDES-DOCUSATE SODIUM 8.6-50 MG PO TABS
1.0000 | ORAL_TABLET | Freq: Two times a day (BID) | ORAL | Status: DC
  Filled 2019-02-25 (×4): qty 1

## 2019-02-25 MED ORDER — PROMETHAZINE HCL 25 MG PO TABS: 12.5000 mg | ORAL_TABLET | Freq: Four times a day (QID) | ORAL | Status: DC | PRN

## 2019-02-25 MED ORDER — HYDROMORPHONE HCL 1 MG/ML IJ SOLN
2.0000 mg | Freq: Once | INTRAMUSCULAR | Status: AC
  Administered 2019-02-25: 2 mg via INTRAVENOUS
  Filled 2019-02-25: qty 2

## 2019-02-25 MED ORDER — FOLIC ACID 1 MG PO TABS
1.0000 mg | ORAL_TABLET | Freq: Every day | ORAL | Status: DC
  Administered 2019-02-26 – 2019-02-28 (×3): 1 mg via ORAL
  Filled 2019-02-25 (×3): qty 1

## 2019-02-25 MED ORDER — KETOROLAC TROMETHAMINE 30 MG/ML IJ SOLN
30.0000 mg | Freq: Four times a day (QID) | INTRAMUSCULAR | Status: DC
  Administered 2019-02-26 – 2019-02-28 (×11): 30 mg via INTRAVENOUS
  Filled 2019-02-25 (×11): qty 1

## 2019-02-25 MED ORDER — HYDROXYUREA 500 MG PO CAPS
1000.0000 mg | ORAL_CAPSULE | Freq: Every day | ORAL | Status: DC
  Administered 2019-02-26 – 2019-02-28 (×3): 1000 mg via ORAL
  Filled 2019-02-25 (×3): qty 2

## 2019-02-25 MED ORDER — HYDROMORPHONE HCL 1 MG/ML IJ SOLN
2.0000 mg | INTRAMUSCULAR | Status: AC
  Administered 2019-02-25: 2 mg via SUBCUTANEOUS
  Filled 2019-02-25: qty 2

## 2019-02-25 MED ORDER — POLYETHYLENE GLYCOL 3350 17 G PO PACK: 17.0000 g | PACK | Freq: Every day | ORAL | Status: DC | PRN

## 2019-02-25 MED ORDER — HYDROMORPHONE 1 MG/ML IV SOLN
INTRAVENOUS | Status: DC
  Administered 2019-02-26: 3.5 mg via INTRAVENOUS
  Administered 2019-02-26: 7.5 mg via INTRAVENOUS
  Administered 2019-02-26: 30 mg via INTRAVENOUS
  Administered 2019-02-26: 10.5 mg via INTRAVENOUS
  Administered 2019-02-26: 13 mg via INTRAVENOUS
  Administered 2019-02-26: 4.5 mg via INTRAVENOUS
  Administered 2019-02-27: 6.5 mg via INTRAVENOUS
  Administered 2019-02-27: 1.5 mg via INTRAVENOUS
  Administered 2019-02-27: 5 mg via INTRAVENOUS
  Administered 2019-02-27: 30 mg via INTRAVENOUS
  Administered 2019-02-27: 6 mg via INTRAVENOUS
  Administered 2019-02-27: 30 mg via INTRAVENOUS
  Administered 2019-02-27: 12 mg via INTRAVENOUS
  Administered 2019-02-27: 4 mg via INTRAVENOUS
  Administered 2019-02-28: 8.5 mg via INTRAVENOUS
  Administered 2019-02-28: 0 mg via INTRAVENOUS
  Filled 2019-02-25 (×4): qty 30

## 2019-02-25 MED ORDER — PROMETHAZINE HCL 25 MG RE SUPP: 25.0000 mg | Freq: Four times a day (QID) | RECTAL | Status: DC | PRN

## 2019-02-25 MED ORDER — LORAZEPAM 2 MG/ML IJ SOLN
0.5000 mg | Freq: Once | INTRAMUSCULAR | Status: AC
  Administered 2019-02-25: 0.5 mg via INTRAVENOUS
  Filled 2019-02-25: qty 1

## 2019-02-25 MED ORDER — SODIUM CHLORIDE 0.9 % IV SOLN
25.0000 mg | INTRAVENOUS | Status: DC | PRN
  Administered 2019-02-26: 25 mg via INTRAVENOUS
  Filled 2019-02-25: qty 25
  Filled 2019-02-25: qty 0.5

## 2019-02-25 MED ORDER — SODIUM CHLORIDE 0.9% FLUSH: 9.0000 mL | INTRAVENOUS | Status: DC | PRN

## 2019-02-25 MED ORDER — ONDANSETRON HCL 4 MG/2ML IJ SOLN
4.0000 mg | Freq: Four times a day (QID) | INTRAMUSCULAR | Status: DC | PRN
  Administered 2019-02-26 – 2019-02-28 (×3): 4 mg via INTRAVENOUS
  Filled 2019-02-25 (×3): qty 2

## 2019-02-25 MED ORDER — SODIUM CHLORIDE 0.45 % IV SOLN: INTRAVENOUS | Status: DC

## 2019-02-25 MED ORDER — ONDANSETRON HCL 4 MG/2ML IJ SOLN
4.0000 mg | Freq: Once | INTRAMUSCULAR | Status: AC
  Administered 2019-02-25: 4 mg via INTRAVENOUS
  Filled 2019-02-25: qty 2

## 2019-02-25 MED ORDER — NALOXONE HCL 0.4 MG/ML IJ SOLN: 0.4000 mg | INTRAMUSCULAR | Status: DC | PRN

## 2019-02-25 MED ORDER — ENOXAPARIN SODIUM 40 MG/0.4ML ~~LOC~~ SOLN: 40.0000 mg | Freq: Every day | SUBCUTANEOUS | Status: DC

## 2019-02-25 MED ORDER — ACETAMINOPHEN 325 MG PO TABS: 650.0000 mg | ORAL_TABLET | Freq: Four times a day (QID) | ORAL | Status: DC | PRN

## 2019-02-25 MED ORDER — PROMETHAZINE HCL 25 MG PO TABS
25.0000 mg | ORAL_TABLET | ORAL | Status: DC | PRN
  Administered 2019-02-25 – 2019-02-26 (×2): 25 mg via ORAL
  Filled 2019-02-25 (×2): qty 1

## 2019-02-25 MED ORDER — PROMETHAZINE HCL 25 MG/ML IJ SOLN
25.0000 mg | Freq: Once | INTRAMUSCULAR | Status: AC
  Administered 2019-02-25: 25 mg via INTRAVENOUS
  Filled 2019-02-25: qty 1

## 2019-02-25 MED ORDER — DIPHENHYDRAMINE HCL 25 MG PO CAPS
25.0000 mg | ORAL_CAPSULE | ORAL | Status: DC | PRN
  Administered 2019-02-25: 25 mg via ORAL
  Filled 2019-02-25: qty 1

## 2019-02-25 NOTE — ED Notes (Signed)
Pt on cardiac monitor and auto VS 
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
## 2019-02-25 NOTE — ED Notes (Signed)
Provider made aware of pt's c/o of continued pain and nausea
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
## 2019-02-25 NOTE — ED Triage Notes (Signed)
Pt states that he is having all over body pain - reports that it started about 2 days ago. He reports that N/V

## 2019-02-25 NOTE — ED Notes (Signed)
Called [facility] spoke to [place] to admit pt @ 1450

## 2019-02-26 LAB — CBC WITH DIFFERENTIAL/PLATELET
Abs Immature Granulocytes: 0.12 10*3/uL — ABNORMAL HIGH (ref 0.00–0.07)
Basophils Absolute: 0.1 10*3/uL (ref 0.0–0.1)
Basophils Relative: 1 %
Eosinophils Absolute: 0.6 10*3/uL — ABNORMAL HIGH (ref 0.0–0.5)
Eosinophils Relative: 5 %
HCT: 20.6 % — ABNORMAL LOW (ref 39.0–52.0)
Hemoglobin: 7.1 g/dL — ABNORMAL LOW (ref 13.0–17.0)
Immature Granulocytes: 1 %
Lymphocytes Relative: 28 %
Lymphs Abs: 3.2 10*3/uL (ref 0.7–4.0)
MCH: 32 pg (ref 26.0–34.0)
MCHC: 34.5 g/dL (ref 30.0–36.0)
MCV: 92.8 fL (ref 80.0–100.0)
Monocytes Absolute: 1.2 10*3/uL — ABNORMAL HIGH (ref 0.1–1.0)
Monocytes Relative: 11 %
Neutro Abs: 6.2 10*3/uL (ref 1.7–7.7)
Neutrophils Relative %: 54 %
Platelets: 282 10*3/uL (ref 150–400)
RBC: 2.22 MIL/uL — ABNORMAL LOW (ref 4.22–5.81)
RDW: 19.1 % — ABNORMAL HIGH (ref 11.5–15.5)
WBC: 11.4 10*3/uL — ABNORMAL HIGH (ref 4.0–10.5)
nRBC: 0.6 % — ABNORMAL HIGH (ref 0.0–0.2)

## 2019-02-26 LAB — COMPREHENSIVE METABOLIC PANEL
ALT: 28 U/L (ref 0–44)
AST: 45 U/L — ABNORMAL HIGH (ref 15–41)
Albumin: 3.5 g/dL (ref 3.5–5.0)
Alkaline Phosphatase: 66 U/L (ref 38–126)
Anion gap: 7 (ref 5–15)
BUN: 9 mg/dL (ref 6–20)
CO2: 26 mmol/L (ref 22–32)
Calcium: 8.6 mg/dL — ABNORMAL LOW (ref 8.9–10.3)
Chloride: 105 mmol/L (ref 98–111)
Creatinine, Ser: 0.49 mg/dL — ABNORMAL LOW (ref 0.61–1.24)
GFR calc Af Amer: >60 mL/min (ref >60)
GFR calc non Af Amer: >60 mL/min (ref >60)
Glucose, Bld: 88 mg/dL (ref 70–99)
Potassium: 4.2 mmol/L (ref 3.5–5.1)
Sodium: 138 mmol/L (ref 135–145)
Total Bilirubin: 4.9 mg/dL — ABNORMAL HIGH (ref 0.3–1.2)
Total Protein: 6.8 g/dL (ref 6.5–8.1)

## 2019-02-26 MED ORDER — HYDROMORPHONE BOLUS VIA INFUSION
1.0000 mg | Freq: Once | INTRAVENOUS | Status: AC
  Administered 2019-02-26: 1 mg via INTRAVENOUS
  Filled 2019-02-26: qty 1

## 2019-02-26 MED ORDER — NICOTINE 21 MG/24HR TD PT24
21.0000 mg | MEDICATED_PATCH | Freq: Every day | TRANSDERMAL | Status: DC
  Filled 2019-02-26 (×3): qty 1

## 2019-02-26 MED ORDER — HYDROMORPHONE HCL 1 MG/ML IJ SOLN
1.0000 mg | Freq: Once | INTRAMUSCULAR | Status: AC
  Administered 2019-02-26: 1 mg via INTRAVENOUS
  Filled 2019-02-26: qty 1

## 2019-02-26 MED ORDER — HYDROMORPHONE BOLUS VIA INFUSION
1.0000 mg | Freq: Once | INTRAVENOUS | Status: AC
  Administered 2019-02-26: 1 mg via INTRAVENOUS

## 2019-02-26 MED ORDER — PROMETHAZINE HCL 25 MG/ML IJ SOLN
12.5000 mg | Freq: Once | INTRAMUSCULAR | Status: AC | PRN
  Administered 2019-02-26: 12.5 mg via INTRAVENOUS
  Filled 2019-02-26: qty 1

## 2019-02-26 MED ORDER — HYDROMORPHONE HCL 2 MG/ML IJ SOLN
2.0000 mg | Freq: Once | INTRAMUSCULAR | Status: AC
  Administered 2019-02-26: 2 mg via SUBCUTANEOUS
  Filled 2019-02-26: qty 1

## 2019-02-26 NOTE — Progress Notes (Signed)
Subjective: Patient is a 29 year old admitted with sickle cell crisis. He has been having pain at 10 out of 10 in his back legs and arms on admission. Pain has improved now down to 8 out of 10. No fever no chills no nausea vomiting diarrhea. Patient is on Dilaudid PCA with Toradol and IV fluids. No significant fever or chills.  Objective: Vital signs in last 24 hours: Temp:  [98 F (36.7 C)-98.6 F (37 C)] 98.3 F (36.8 C) (01/17 0959) Pulse Rate:  [64-108] 75 (01/17 0959) Resp:  [11-20] 16 (01/17 0959) BP: (90-120)/(54-78) 99/62 (01/17 0959) SpO2:  [92 %-100 %] 94 % (01/17 0959) Weight:  [62 kg] 62 kg (01/16 2323) Weight change:  Last BM Date: 02/25/19  Intake/Output from previous day: 01/16 0701 - 01/17 0700 In: 652 [I.V.:652] Out: 850 [Urine:850] Intake/Output this shift: Total I/O In: -  Out: 1000 [Urine:1000]  General appearance: alert, cooperative, appears stated age and no distress Neck: no adenopathy, no carotid bruit, no JVD, supple, symmetrical, trachea midline and thyroid not enlarged, symmetric, no tenderness/mass/nodules Back: symmetric, no curvature. ROM normal. No CVA tenderness. Resp: clear to auscultation bilaterally Cardio: regular rate and rhythm, S1, S2 normal, no murmur, click, rub or gallop GI: soft, non-tender; bowel sounds normal; no masses,  no organomegaly Extremities: extremities normal, atraumatic, no cyanosis or edema Pulses: 2+ and symmetric Skin: Skin color, texture, turgor normal. No rashes or lesions Neurologic: Grossly normal  Lab Results: Recent Labs    02/25/19 1220 02/26/19 0505  WBC 15.6* 11.4*  HGB 7.5* 7.1*  HCT 22.6* 20.6*  PLT 305 282   BMET Recent Labs    02/25/19 1220 02/26/19 0505  NA 138 138  K 4.0 4.2  CL 103 105  CO2 28 26  GLUCOSE 100* 88  BUN 5* 9  CREATININE 0.44* 0.49*  CALCIUM 9.1 8.6*    Studies/Results: DG Chest Port 1 View  Result Date: 02/25/2019 CLINICAL DATA:  Chest pain. EXAM: PORTABLE CHEST 1  VIEW COMPARISON:  Dorene Sorrow 10/2019. FINDINGS: The heart size and mediastinal contours are within normal limits. No pneumothorax or pleural effusion is noted. Minimal to mild bibasilar opacities are noted concerning for subsegmental atelectasis or possibly pneumonia which are grossly stable. The visualized skeletal structures are unremarkable. IMPRESSION: Minimal to mild bibasilar opacities are noted concerning for subsegmental atelectasis or possibly pneumonia which are grossly stable. Electronically Signed   By: Lupita Raider M.D.   On: 02/25/2019 11:49    Medications: I have reviewed the patient's current medications.  Assessment/Plan: 29 year old admitted with sickle cell painful crisis.  #1 sickle cell painful crisis: Will be maintained on current Dilaudid PCA and Toradol. His complaint of nausea and has been on Phenergan and Zofran. We will continue to assess his pain.  #2 sickle cell anemia: Hemoglobin is 7.1. Was 7.5 yesterday. Monitor closely. No indication for transfusion  #3 tobacco abuse: Initiate nicotine patch. Also stop oxygen counseling.  #4 leukocytosis: White count is improving. Continue monitoring  LOS: 1 day   Hallie Ertl,LAWAL 02/26/2019, 11:44 AM

## 2019-02-27 DIAGNOSIS — D5700 Hb-SS disease with crisis, unspecified: Principal | ICD-10-CM

## 2019-02-27 DIAGNOSIS — F17209 Nicotine dependence, unspecified, with unspecified nicotine-induced disorders: Secondary | ICD-10-CM

## 2019-02-27 DIAGNOSIS — F1910 Other psychoactive substance abuse, uncomplicated: Secondary | ICD-10-CM

## 2019-02-27 DIAGNOSIS — G894 Chronic pain syndrome: Secondary | ICD-10-CM

## 2019-02-27 MED ORDER — ONDANSETRON HCL 4 MG/2ML IJ SOLN
4.0000 mg | Freq: Once | INTRAMUSCULAR | Status: AC
  Administered 2019-02-27: 4 mg via INTRAVENOUS
  Filled 2019-02-27: qty 2

## 2019-02-27 NOTE — Progress Notes (Signed)
Patient requesting dilaudid bolus and IV phenergan for nausea. Dr.Jegede paged and made aware. MD is not agreeable to a bolus dose at this time. Zofran 1 time dose ordered.

## 2019-02-27 NOTE — Progress Notes (Addendum)
   02/27/19 1030  MEWS Score  MEWS Temp 0  MEWS Systolic 1  MEWS Pulse 0  MEWS RR 1  MEWS LOC 0  MEWS Score 2  MEWS Score Color Yellow  Yellow MEWS score triggered. [name] on unit and made aware. No new interventions needed @ this time. Will continue to monitor patient.

## 2019-02-27 NOTE — Progress Notes (Signed)
Patient ID: Matthew Shannon, male   DOB: Feb 25, 1990, 29 y.o.   MRN: [id number] Subjective: Matthew Shannon, is a 29 year old male with a medical history significant for sickle cell disease, chronic pain syndrome, opiate dependence and tolerance, history of anemia of chronic disease, and history of polysubstance abuse and drug-seeking behavior who was re-admitted again for sickle cell pain crisis.  Patient claiming his pain is still at 10/10 this morning. Demanding for IV Dilaudid pushes. Patient is on high-dose weight-based Dilaudid via PCA and ketorolac IV as well as oral pain medications. He however denies any fever, cough, chest pain, shortness of breath, dizziness, nausea, vomiting or diarrhea.  Objective:  Vital signs in last 24 hours:  Vitals:   02/27/19 1000 02/27/19 1029 02/27/19 1255 02/27/19 1259  BP: 93/67  111/65   Pulse: 74  81   Resp: 18 11 16 12   Temp: 97.8 F (36.6 C)  99 F (37.2 C)   TempSrc: Oral  Oral   SpO2: 98% 99% 97% 98%  Weight:      Height:        Intake/Output from previous day:   Intake/Output Summary (Last 24 hours) at 02/27/2019 1715 Last data filed at 02/27/2019 1400 Gross per 24 hour  Intake 960 ml  Output 1525 ml  Net -565 ml    Physical Exam: General: Alert, awake, oriented x3, in no acute distress.  HEENT: [place]/AT PEERL, EOMI Neck: Trachea midline,  no masses, no thyromegal,y no JVD, no carotid bruit OROPHARYNX:  Moist, No exudate/ erythema/lesions.  Heart: Regular rate and rhythm, without murmurs, rubs, gallops, PMI non-displaced, no heaves or thrills on palpation.  Lungs: Clear to auscultation, no wheezing or rhonchi noted. No increased vocal fremitus resonant to percussion  Abdomen: Soft, nontender, nondistended, positive bowel sounds, no masses no hepatosplenomegaly noted..  Neuro: No focal neurological deficits noted cranial nerves II through XII grossly intact. DTRs 2+ bilaterally upper and lower extremities. Strength 5 out of 5 in bilateral  upper and lower extremities. Musculoskeletal: No warm swelling or erythema around joints, no spinal tenderness noted. Psychiatric: Patient alert and oriented x3, good insight and cognition, good recent to remote recall. Lymph node survey: No cervical axillary or inguinal lymphadenopathy noted.  Lab Results:  Basic Metabolic Panel:    Component Value Date/Time   NA 138 02/26/2019 0505   NA 137 11/29/2018 1224   K 4.2 02/26/2019 0505   CL 105 02/26/2019 0505   CO2 26 02/26/2019 0505   BUN 9 02/26/2019 0505   BUN 5 (L) 11/29/2018 1224   CREATININE 0.49 (L) 02/26/2019 0505   GLUCOSE 88 02/26/2019 0505   CALCIUM 8.6 (L) 02/26/2019 0505   CBC:    Component Value Date/Time   WBC 11.4 (H) 02/26/2019 0505   HGB 7.1 (L) 02/26/2019 0505   HGB 8.1 (L) 11/29/2018 1224   HCT 20.6 (L) 02/26/2019 0505   HCT 24.6 (L) 11/29/2018 1224   PLT 282 02/26/2019 0505   PLT 330 11/29/2018 1224   MCV 92.8 02/26/2019 0505   MCV 104 (H) 11/29/2018 1224   NEUTROABS 6.2 02/26/2019 0505   NEUTROABS 6.4 11/29/2018 1224   LYMPHSABS 3.2 02/26/2019 0505   LYMPHSABS 3.9 (H) 11/29/2018 1224   MONOABS 1.2 (H) 02/26/2019 0505   EOSABS 0.6 (H) 02/26/2019 0505   EOSABS 0.3 11/29/2018 1224   BASOSABS 0.1 02/26/2019 0505   BASOSABS 0.1 11/29/2018 1224    Recent Results (from the past 240 hour(s))  SARS CORONAVIRUS 2 (TAT 6-24 HRS) Nasopharyngeal  Nasopharyngeal Swab     Status: None   Collection Time: 02/19/19  8:24 AM   Specimen: Nasopharyngeal Swab  Result Value Ref Range Status   SARS Coronavirus 2 NEGATIVE NEGATIVE Final    Comment: (NOTE) SARS-CoV-2 target nucleic acids are NOT DETECTED. The SARS-CoV-2 RNA is generally detectable in upper and lower respiratory specimens during the acute phase of infection. Negative results do not preclude SARS-CoV-2 infection, do not rule out co-infections with other pathogens, and should not be used as the sole basis for treatment or other patient management  decisions. Negative results must be combined with clinical observations, patient history, and epidemiological information. The expected result is Negative. Fact Sheet for Patients: HairSlick.no Fact Sheet for Healthcare Providers: quierodirigir.com This test is not yet approved or cleared by the [place] FDA and  has been authorized for detection and/or diagnosis of SARS-CoV-2 by FDA under an Emergency Use Authorization (EUA). This EUA will remain  in effect (meaning this test can be used) for the duration of the COVID-19 declaration under Section 56 4(b)(1) of the Act, 21 U.S.C. section 360bbb-3(b)(1), unless the authorization is terminated or revoked sooner. Performed at [facility], [address] [place], [place] [address]   Urine Culture     Status: None   Collection Time: 02/22/19  8:25 PM   Specimen: Urine, Clean Catch  Result Value Ref Range Status   Specimen Description   Final    URINE, CLEAN CATCH Performed at [facility], [address], [place], [place] [address]    Special Requests   Final    NONE Performed at [facility], [address], [place], [place] [address]    Culture   Final    NO GROWTH Performed at [facility], [address] [place], [place] [address]    Report Status 02/24/2019 FINAL  Final  SARS CORONAVIRUS 2 (TAT 6-24 HRS) Nasopharyngeal Nasopharyngeal Swab     Status: None   Collection Time: 02/25/19  1:51 PM   Specimen: Nasopharyngeal Swab  Result Value Ref Range Status   SARS Coronavirus 2 NEGATIVE NEGATIVE Final    Comment: (NOTE) SARS-CoV-2 target nucleic acids are NOT DETECTED. The SARS-CoV-2 RNA is generally detectable in upper and lower respiratory specimens during the acute phase of infection. Negative results do not preclude SARS-CoV-2 infection, do not rule out co-infections with other pathogens, and should not be used  as the sole basis for treatment or other patient management decisions. Negative results must be combined with clinical observations, patient history, and epidemiological information. The expected result is Negative. Fact Sheet for Patients: HairSlick.no Fact Sheet for Healthcare Providers: quierodirigir.com This test is not yet approved or cleared by the [place] FDA and  has been authorized for detection and/or diagnosis of SARS-CoV-2 by FDA under an Emergency Use Authorization (EUA). This EUA will remain  in effect (meaning this test can be used) for the duration of the COVID-19 declaration under Section 56 4(b)(1) of the Act, 21 U.S.C. section 360bbb-3(b)(1), unless the authorization is terminated or revoked sooner. Performed at [facility], [address], [place], [place] [address]     Studies/Results: No results found.  Medications: Scheduled Meds: . enoxaparin (LOVENOX) injection  40 mg Subcutaneous QHS  . folic acid  1 mg Oral Daily  . HYDROmorphone   Intravenous Q4H  . hydroxyurea  1,000 mg Oral Daily  . ketorolac  30 mg Intravenous Q6H  . nicotine  21 mg Transdermal Daily  . oxyCODONE  10 mg Oral Q12H  . senna-docusate  1 tablet Oral BID   Continuous Infusions: . sodium chloride 75 mL/hr at 02/27/19 0511  . diphenhydrAMINE 25 mg (02/26/19 0154)   PRN Meds:.acetaminophen, diphenhydrAMINE **OR** diphenhydrAMINE, diphenhydrAMINE, naloxone **AND** sodium chloride flush, ondansetron (ZOFRAN) IV, polyethylene glycol, promethazine, promethazine  Assessment/Plan: Principal Problem:   Sickle cell anemia with crisis ([place]) Active Problems:   Chronic pain   Tobacco use disorder, continuous   Polysubstance abuse ([place])  1. Hb Sickle Cell Disease with crisis: Reduce IVF to [facility], continue weight based Dilaudid PCA, IV Toradol 15 mg Q 6 H for total of 5 days including previous admission, continue oral home pain  medications, monitor vitals very closely, Re-evaluate pain scale regularly, 2 L of Oxygen by [place]. 2. Sickle Cell Anemia: Hemoglobin is stable at baseline, no clinical indication for blood transfusion today. 3. Chronic pain Syndrome: Continue home medications 4. Tobacco use disorder: Berthold was counseled on the dangers of tobacco use, and was advised to quit. Reviewed strategies to maximize success, including removing cigarettes and smoking materials from environment, stress management and support of family/friends. 5. Drug Seeking Behavior: Patient has been in and out of healthcare facilities almost everyday since late December 2020, he visited ED sometimes 2x in a day, and different EDs sometimes. He always complained of running out of his prescribed high dose opiates even if it's just been dispensed from the pharmacy. Patient has been counseled extensively in the past and on-going, it is questionable if he actually takes his prescription considering his daily hospital visits.    Code Status: Full Code Family Communication: N/A Disposition Plan: Not yet ready for discharge  Edward Trevino  If 7PM-7AM, please contact night-coverage.  02/27/2019, 5:15 PM  LOS: 2 days

## 2019-02-28 ENCOUNTER — Telehealth: Payer: Self-pay | Admitting: Family Medicine

## 2019-02-28 LAB — CBC WITH DIFFERENTIAL/PLATELET
Abs Immature Granulocytes: 0.08 10*3/uL — ABNORMAL HIGH (ref 0.00–0.07)
Basophils Absolute: 0.1 10*3/uL (ref 0.0–0.1)
Basophils Relative: 1 %
Eosinophils Absolute: 1 10*3/uL — ABNORMAL HIGH (ref 0.0–0.5)
Eosinophils Relative: 11 %
HCT: 20.1 % — ABNORMAL LOW (ref 39.0–52.0)
Hemoglobin: 6.8 g/dL — CL (ref 13.0–17.0)
Immature Granulocytes: 1 %
Lymphocytes Relative: 33 %
Lymphs Abs: 3.3 10*3/uL (ref 0.7–4.0)
MCH: 31.1 pg (ref 26.0–34.0)
MCHC: 33.8 g/dL (ref 30.0–36.0)
MCV: 91.8 fL (ref 80.0–100.0)
Monocytes Absolute: 1.1 10*3/uL — ABNORMAL HIGH (ref 0.1–1.0)
Monocytes Relative: 11 %
Neutro Abs: 4.3 10*3/uL (ref 1.7–7.7)
Neutrophils Relative %: 43 %
Platelets: 276 10*3/uL (ref 150–400)
RBC: 2.19 MIL/uL — ABNORMAL LOW (ref 4.22–5.81)
RDW: 17.5 % — ABNORMAL HIGH (ref 11.5–15.5)
WBC: 10 10*3/uL (ref 4.0–10.5)
nRBC: 0.6 % — ABNORMAL HIGH (ref 0.0–0.2)

## 2019-02-28 LAB — COMPREHENSIVE METABOLIC PANEL
ALT: 20 U/L (ref 0–44)
AST: 31 U/L (ref 15–41)
Albumin: 3.3 g/dL — ABNORMAL LOW (ref 3.5–5.0)
Alkaline Phosphatase: 62 U/L (ref 38–126)
Anion gap: 7 (ref 5–15)
BUN: 6 mg/dL (ref 6–20)
CO2: 26 mmol/L (ref 22–32)
Calcium: 8.8 mg/dL — ABNORMAL LOW (ref 8.9–10.3)
Chloride: 106 mmol/L (ref 98–111)
Creatinine, Ser: 0.49 mg/dL — ABNORMAL LOW (ref 0.61–1.24)
GFR calc Af Amer: >60 mL/min (ref >60)
GFR calc non Af Amer: >60 mL/min (ref >60)
Glucose, Bld: 108 mg/dL — ABNORMAL HIGH (ref 70–99)
Potassium: 3.9 mmol/L (ref 3.5–5.1)
Sodium: 139 mmol/L (ref 135–145)
Total Bilirubin: 3.2 mg/dL — ABNORMAL HIGH (ref 0.3–1.2)
Total Protein: 6.5 g/dL (ref 6.5–8.1)

## 2019-02-28 MED ORDER — HYDROMORPHONE 1 MG/ML IV SOLN: INTRAVENOUS | Status: DC

## 2019-02-28 MED ORDER — OXYCODONE HCL 5 MG PO TABS
10.0000 mg | ORAL_TABLET | Freq: Four times a day (QID) | ORAL | Status: DC | PRN
  Administered 2019-02-28: 10 mg via ORAL
  Filled 2019-02-28: qty 2

## 2019-03-01 ENCOUNTER — Other Ambulatory Visit: Payer: Self-pay | Admitting: Family Medicine

## 2019-03-01 DIAGNOSIS — G894 Chronic pain syndrome: Secondary | ICD-10-CM

## 2019-03-01 NOTE — Progress Notes (Signed)
Matthew Shannon, a 29 year old male with a medical history significant for sickle cell disease, chronic pain syndrome, opiate dependence and tolerance is on 270 MME/day. Will send a referral to pain management. This is beyond what this clinic can offer. Patient continues to frequent the ER and has had 3 hospital admissions since February 10, 2019 and it is January 20th. Patient filled prescription for Oxycodone 30 mg on 02/28/2019. Patient may warrant suboxone due to increased tolerance.   [name]  APRN, MSN, FNP-C Patient Care [facility] [address] [place], [place] [address] [phone]

## 2019-03-02 NOTE — Telephone Encounter (Signed)
Sent to NP 

## 2019-03-02 NOTE — Discharge Summary (Signed)
Physician Discharge Summary  Matthew Shannon ERX:[id number] DOB: [birth year] DOA: 02/25/2019  PCP: Matthew Angst, MD  Admit date: 02/25/2019  Discharge date: 03/02/2019  Discharge Diagnoses:  Principal Problem:   Sickle cell anemia with crisis [facility]) Active Problems:   Sickle cell crisis (HCC)   Chronic pain   Tobacco use disorder, continuous   Polysubstance abuse (HCC)   Discharge Condition: Stable  Disposition:  Pt is discharged home in good condition and is to follow up with Matthew Angst, MD this week to have labs evaluated. Matthew Shannon is instructed to increase activity slowly and balance with rest for the next few days, and use prescribed medication to complete treatment of pain  Diet: Regular Wt Readings from Last 3 Encounters:  02/28/19 62.7 kg  02/24/19 60.9 kg  02/19/19 61.2 kg    History of present illness:   Matthew Shannon, a 29 year old male with a medical history significant for sickle cell disease, chronic pain syndrome, opiate dependence and tolerance, history of polysubstance abuse, drug-seeking behavior, and history of anemia of chronic disease presented to the ER complaining of generalized pain.  Patient has had recurrent hospitalizations over the past several weeks.  He was discharged from inpatient services 1 day prior.  Pain is mainly in upper and lower extremities.  He says that he is taking home medications without relief.  He also endorses mild shortness of breath.  Patient is a known tobacco abuser.  Also, previous history of cocaine use and drug-seeking behavior.  Patient denied fever, chills, or exposure to COVID-19.  ER course: While in ER, patient received 6 mg of IV Dilaudid and pain persists despite treatment.  Patient admitted for sickle cell pain crisis.  Hospital Course:   Sickle cell disease with pain crisis:   Patient was admitted for sickle cell pain crisis and managed appropriately with IVF, IV Dilaudid via PCA and IV Toradol, as well  as other adjunct therapies per sickle cell pain management protocols.  Dilaudid was weaned appropriately.  Patient will resume home medications.  Patient abruptly requested discharge, he states that his grandfather passed away and he needs to go out of town immediately.  He requested oxycodone 30 mg, which is not due on today.  Patient has exhibited this behavior and left abruptly in the past with the exact problem. Patient's vital signs are stable. Hemoglobin is 6.8, which is slightly below baseline. Suspected to be hemodilution. No blood transfusion warranted during admission. Patient advised to follow up with PCP in 1 week for CBC and CMP.   Patient alert, oriented, and ambulating without assistance.   Patient was discharged home today in a hemodynamically stable condition.   Discharge Exam: Vitals:   02/28/19 1200 02/28/19 1311  BP:  109/69  Pulse:  79  Resp: 13 18  Temp:  98.8 F (37.1 C)  SpO2: 98% 96%   Vitals:   02/28/19 0647 02/28/19 0832 02/28/19 1200 02/28/19 1311  BP: (!) 85/42   109/69  Pulse: 60   79  Resp: 15 12 13 18   Temp: 98.8 F (37.1 C)   98.8 F (37.1 C)  TempSrc: Oral   Oral  SpO2: 96% 99% 98% 96%  Weight: 62.7 kg     Height:       Physical Exam Constitutional:      Appearance: Normal appearance.  HENT:     Head: Normocephalic.     Mouth/Throat:     Mouth: Mucous membranes are moist.  Cardiovascular:  Rate and Rhythm: Normal rate and regular rhythm.     Pulses: Normal pulses.  Pulmonary:     Effort: Pulmonary effort is normal.     Breath sounds: Normal breath sounds.  Abdominal:     General: Bowel sounds are normal.     Palpations: Abdomen is soft.  Musculoskeletal:        General: Normal range of motion.  Skin:    General: Skin is warm.  Neurological:     General: No focal deficit present.     Mental Status: He is alert. Mental status is at baseline.  Psychiatric:        Mood and Affect: Mood normal.        Behavior: Behavior normal.         Thought Content: Thought content normal.        Judgment: Judgment normal.     Discharge Instructions  Discharge Instructions    Discharge patient   Complete by: As directed    Discharge disposition: 01-Home or Self Care   Discharge patient date: 02/28/2019     Allergies as of 02/28/2019      Reactions   Ceftriaxone Shortness Of Breath, Itching, Other (See Comments), Cough   Zosyn [piperacillin Sod-tazobactam So] Shortness Of Breath   SOB/rash/N/V Has patient had a PCN reaction causing immediate rash, facial/tongue/throat swelling, SOB or lightheadedness with hypotension: Y Has patient had a PCN reaction causing severe rash involving mucus membranes or skin necrosis: N Has patient had a PCN reaction that required hospitalization: Y Has patient had a PCN reaction occurring within the last 10 years: Y If all of the above answers are "NO", then may proceed with Cephalosporin use.   Azithromycin Itching      Medication List    TAKE these medications   acetaminophen 325 MG tablet Commonly known as: TYLENOL Take 650 mg by mouth every 6 (six) hours as needed for mild pain.   folic acid 1 MG tablet Commonly known as: FOLVITE Take 1 tablet (1 mg total) by mouth daily.   hydroxyurea 500 MG capsule Commonly known as: HYDREA Take 2 capsules (1,000 mg total) by mouth daily. May take with food to minimize GI side effects.   ibuprofen 800 MG tablet Commonly known as: ADVIL Take 1 tablet (800 mg total) by mouth every 8 (eight) hours as needed.   promethazine 12.5 MG tablet Commonly known as: PHENERGAN Take 1 tablet (12.5 mg total) by mouth every 6 (six) hours as needed for nausea or vomiting.   promethazine 25 MG suppository Commonly known as: PHENERGAN Place 1 suppository (25 mg total) rectally every 6 (six) hours as needed for nausea or vomiting.     ASK your doctor about these medications   oxycodone 30 MG immediate release tablet Commonly known as: ROXICODONE Take 1  tablet (30 mg total) by mouth every 4 (four) hours as needed for up to 15 days for pain. Ask about: Should I take this medication?       The results of significant diagnostics from this hospitalization (including imaging, microbiology, ancillary and laboratory) are listed below for reference.    Significant Diagnostic Studies: DG Chest 2 View  Result Date: 02/18/2019 CLINICAL DATA:  Sickle cell crisis. Back pain. Shortness of breath. EXAM: CHEST - 2 VIEW COMPARISON:  February 04, 2019 FINDINGS: New opacity seen in the right base. Mild patchy opacity in left base. The heart, hila, mediastinum, lungs, and pleura are otherwise unremarkable. IMPRESSION: New opacities in the bases,  right greater than left are nonspecific but may represent pneumonia. Recommend clinical correlation and short-term follow-up to ensure resolution. Electronically Signed   By: Gerome Sam III M.D   On: 02/18/2019 11:38   DG Chest 2 View  Result Date: 02/04/2019 CLINICAL DATA:  Chest pain with nausea and vomiting. Sickle cell disease. EXAM: CHEST - 2 VIEW COMPARISON:  01/24/2019 and 09/12/2018 FINDINGS: Lungs are adequately inflated with mild chronic bibasilar interstitial changes. No effusion or focal lobar consolidation. Cardiomediastinal silhouette and remainder of the exam is unchanged. IMPRESSION: 1.  No acute cardiopulmonary disease. 2.  Chronic bibasilar interstitial changes. Electronically Signed   By: Elberta Fortis M.D.   On: 02/04/2019 08:11   DG Chest Port 1 View  Result Date: 02/25/2019 CLINICAL DATA:  Chest pain. EXAM: PORTABLE CHEST 1 VIEW COMPARISON:  Dorene Sorrow 10/2019. FINDINGS: The heart size and mediastinal contours are within normal limits. No pneumothorax or pleural effusion is noted. Minimal to mild bibasilar opacities are noted concerning for subsegmental atelectasis or possibly pneumonia which are grossly stable. The visualized skeletal structures are unremarkable. IMPRESSION: Minimal to mild bibasilar  opacities are noted concerning for subsegmental atelectasis or possibly pneumonia which are grossly stable. Electronically Signed   By: Lupita Raider M.D.   On: 02/25/2019 11:49    Microbiology: Recent Results (from the past 240 hour(s))  Urine Culture     Status: None   Collection Time: 02/22/19  8:25 PM   Specimen: Urine, Clean Catch  Result Value Ref Range Status   Specimen Description   Final    URINE, CLEAN CATCH Performed at [facility], [address], [place], [place] [address]    Special Requests   Final    NONE Performed at [facility], [address] [place], [place] [address]    Culture   Final    NO GROWTH Performed at [facility], [address] [place], [place] [address]    Report Status 02/24/2019 FINAL  Final  SARS CORONAVIRUS 2 (TAT 6-24 HRS) Nasopharyngeal Nasopharyngeal Swab     Status: None   Collection Time: 02/25/19  1:51 PM   Specimen: Nasopharyngeal Swab  Result Value Ref Range Status   SARS Coronavirus 2 NEGATIVE NEGATIVE Final    Comment: (NOTE) SARS-CoV-2 target nucleic acids are NOT DETECTED. The SARS-CoV-2 RNA is generally detectable in upper and lower respiratory specimens during the acute phase of infection. Negative results do not preclude SARS-CoV-2 infection, do not rule out co-infections with other pathogens, and should not be used as the sole basis for treatment or other patient management decisions. Negative results must be combined with clinical observations, patient history, and epidemiological information. The expected result is Negative. Fact Sheet for Patients: HairSlick.no Fact Sheet for Healthcare Providers: quierodirigir.com This test is not yet approved or cleared by the [place] FDA and  has been authorized for detection and/or diagnosis of SARS-CoV-2 by FDA under an Emergency Use Authorization (EUA). This EUA will  remain  in effect (meaning this test can be used) for the duration of the COVID-19 declaration under Section 56 4(b)(1) of the Act, 21 U.S.C. section 360bbb-3(b)(1), unless the authorization is terminated or revoked sooner. Performed at [facility], [address], [place], [place] [address]      Labs: Basic Metabolic Panel: Recent Labs  Lab 02/23/19 0659 02/23/19 0659 02/25/19 1220 02/25/19 1220 02/26/19 0505 02/28/19 0425  NA 140  --  138  --  138  139  K 4.1   < > 4.0   < > 4.2 3.9  CL 106  --  103  --  105 106  CO2 27  --  28  --  26 26  GLUCOSE 112*  --  100*  --  88 108*  BUN 7  --  5*  --  9 6  CREATININE 0.56*  --  0.44*  --  0.49* 0.49*  CALCIUM 8.9  --  9.1  --  8.6* 8.8*   < > = values in this interval not displayed.   Liver Function Tests: Recent Labs  Lab 02/25/19 1220 02/26/19 0505 02/28/19 0425  AST 45* 45* 31  ALT 24 28 20   ALKPHOS 67 66 62  BILITOT 4.9* 4.9* 3.2*  PROT 7.7 6.8 6.5  ALBUMIN 4.0 3.5 3.3*   Recent Labs  Lab 02/25/19 1220  LIPASE 17   No results for input(s): AMMONIA in the last 168 hours. CBC: Recent Labs  Lab 02/23/19 0659 02/23/19 2053 02/25/19 1220 02/26/19 0505 02/28/19 0425  WBC 14.8*  --  15.6* 11.4* 10.0  NEUTROABS  --   --  11.7* 6.2 4.3  HGB 6.6* 7.7* 7.5* 7.1* 6.8*  HCT 19.6* 23.7* 22.6* 20.6* 20.1*  MCV 95.6  --  94.6 92.8 91.8  PLT 320  --  305 282 276   Cardiac Enzymes: No results for input(s): CKTOTAL, CKMB, CKMBINDEX, TROPONINI in the last 168 hours. BNP: Invalid input(s): POCBNP CBG: No results for input(s): GLUCAP in the last 168 hours.  Time coordinating discharge: 50 minutes  Signed:  03/02/19  APRN, MSN, FNP-C Patient Care [facility] [address] [place], [place] [phone]  [facility] 03/02/2019, 5:14 AM

## 2019-03-04 ENCOUNTER — Encounter (HOSPITAL_COMMUNITY): Payer: Self-pay | Admitting: *Deleted

## 2019-03-04 ENCOUNTER — Other Ambulatory Visit: Payer: Self-pay

## 2019-03-04 ENCOUNTER — Emergency Department (HOSPITAL_COMMUNITY)
Admission: EM | Admit: 2019-03-04 | Discharge: 2019-03-04 | Disposition: A | Discharge status: Home / Self Care | Payer: Medicaid Other | Attending: Emergency Medicine | Admitting: Emergency Medicine

## 2019-03-04 DIAGNOSIS — Z79899 Other long term (current) drug therapy: Secondary | ICD-10-CM | POA: Diagnosis not present

## 2019-03-04 DIAGNOSIS — R112 Nausea with vomiting, unspecified: Secondary | ICD-10-CM | POA: Diagnosis not present

## 2019-03-04 DIAGNOSIS — F17210 Nicotine dependence, cigarettes, uncomplicated: Secondary | ICD-10-CM | POA: Diagnosis not present

## 2019-03-04 DIAGNOSIS — D5700 Hb-SS disease with crisis, unspecified: Secondary | ICD-10-CM | POA: Diagnosis present

## 2019-03-04 DIAGNOSIS — R1084 Generalized abdominal pain: Secondary | ICD-10-CM | POA: Diagnosis not present

## 2019-03-04 LAB — CBC WITH DIFFERENTIAL/PLATELET
Abs Immature Granulocytes: 0.06 10*3/uL (ref 0.00–0.07)
Basophils Absolute: 0.1 10*3/uL (ref 0.0–0.1)
Basophils Relative: 1 %
Eosinophils Absolute: 0.4 10*3/uL (ref 0.0–0.5)
Eosinophils Relative: 4 %
HCT: 23.8 % — ABNORMAL LOW (ref 39.0–52.0)
Hemoglobin: 8 g/dL — ABNORMAL LOW (ref 13.0–17.0)
Immature Granulocytes: 1 %
Lymphocytes Relative: 23 %
Lymphs Abs: 2.8 10*3/uL (ref 0.7–4.0)
MCH: 31.4 pg (ref 26.0–34.0)
MCHC: 33.6 g/dL (ref 30.0–36.0)
MCV: 93.3 fL (ref 80.0–100.0)
Monocytes Absolute: 1.4 10*3/uL — ABNORMAL HIGH (ref 0.1–1.0)
Monocytes Relative: 11 %
Neutro Abs: 7.7 10*3/uL (ref 1.7–7.7)
Neutrophils Relative %: 60 %
Platelets: 386 10*3/uL (ref 150–400)
RBC: 2.55 MIL/uL — ABNORMAL LOW (ref 4.22–5.81)
RDW: 17.4 % — ABNORMAL HIGH (ref 11.5–15.5)
WBC: 12.4 10*3/uL — ABNORMAL HIGH (ref 4.0–10.5)
nRBC: 0.5 % — ABNORMAL HIGH (ref 0.0–0.2)

## 2019-03-04 LAB — COMPREHENSIVE METABOLIC PANEL
ALT: 23 U/L (ref 0–44)
AST: 38 U/L (ref 15–41)
Albumin: 4.4 g/dL (ref 3.5–5.0)
Alkaline Phosphatase: 86 U/L (ref 38–126)
Anion gap: 8 (ref 5–15)
BUN: 6 mg/dL (ref 6–20)
CO2: 24 mmol/L (ref 22–32)
Calcium: 9.4 mg/dL (ref 8.9–10.3)
Chloride: 104 mmol/L (ref 98–111)
Creatinine, Ser: 0.64 mg/dL (ref 0.61–1.24)
GFR calc Af Amer: >60 mL/min (ref >60)
GFR calc non Af Amer: >60 mL/min (ref >60)
Glucose, Bld: 98 mg/dL (ref 70–99)
Potassium: 4.2 mmol/L (ref 3.5–5.1)
Sodium: 136 mmol/L (ref 135–145)
Total Bilirubin: 5.1 mg/dL — ABNORMAL HIGH (ref 0.3–1.2)
Total Protein: 8.8 g/dL — ABNORMAL HIGH (ref 6.5–8.1)

## 2019-03-04 LAB — RETICULOCYTES
Immature Retic Fract: 29.9 % — ABNORMAL HIGH (ref 2.3–15.9)
RBC.: 2.55 MIL/uL — ABNORMAL LOW (ref 4.22–5.81)
Retic Count, Absolute: 259.6 10*3/uL — ABNORMAL HIGH (ref 19.0–186.0)
Retic Ct Pct: 10.2 % — ABNORMAL HIGH (ref 0.4–3.1)

## 2019-03-04 MED ORDER — HYDROMORPHONE HCL 1 MG/ML IJ SOLN
2.0000 mg | INTRAMUSCULAR | Status: AC
  Administered 2019-03-04: 23:00:00 2 mg via SUBCUTANEOUS
  Filled 2019-03-04: qty 2

## 2019-03-04 MED ORDER — SODIUM CHLORIDE 0.9% FLUSH: 3.0000 mL | Freq: Once | INTRAVENOUS | Status: DC

## 2019-03-04 MED ORDER — PROMETHAZINE HCL 25 MG PO TABS
25.0000 mg | ORAL_TABLET | Freq: Once | ORAL | Status: AC
  Administered 2019-03-04: 25 mg via ORAL
  Filled 2019-03-04: qty 1

## 2019-03-04 MED ORDER — OXYCODONE HCL 5 MG PO TABS
30.0000 mg | ORAL_TABLET | Freq: Once | ORAL | Status: AC
  Administered 2019-03-04: 30 mg via ORAL
  Filled 2019-03-04: qty 6

## 2019-03-04 MED ORDER — HYDROMORPHONE HCL 1 MG/ML IJ SOLN
2.0000 mg | INTRAMUSCULAR | Status: AC
  Administered 2019-03-04: 22:00:00 2 mg via SUBCUTANEOUS
  Filled 2019-03-04: qty 2

## 2019-03-04 NOTE — ED Notes (Signed)
Patient verbalizes understanding of discharge instructions. Opportunity for questioning and answers were provided. Armband removed by staff, pt discharged from ED ambulatory.   
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
## 2019-03-04 NOTE — ED Triage Notes (Signed)
The pt has sickle cell disease he cannot keep his ocycontin down for n and v  For 2 days  No porta cath

## 2019-03-05 ENCOUNTER — Emergency Department (HOSPITAL_COMMUNITY)
Admission: EM | Admit: 2019-03-05 | Discharge: 2019-03-05 | Disposition: A | Discharge status: Home / Self Care | Payer: Medicaid Other | Attending: Emergency Medicine | Admitting: Emergency Medicine

## 2019-03-05 ENCOUNTER — Encounter (HOSPITAL_COMMUNITY): Payer: Self-pay | Admitting: *Deleted

## 2019-03-05 ENCOUNTER — Encounter (HOSPITAL_COMMUNITY): Payer: Self-pay

## 2019-03-05 ENCOUNTER — Emergency Department (HOSPITAL_COMMUNITY)
Admission: EM | Admit: 2019-03-05 | Discharge: 2019-03-05 | Disposition: A | Discharge status: Home / Self Care | Payer: Medicaid Other | Source: Home / Self Care | Attending: Emergency Medicine | Admitting: Emergency Medicine

## 2019-03-05 ENCOUNTER — Other Ambulatory Visit: Payer: Self-pay

## 2019-03-05 ENCOUNTER — Emergency Department (HOSPITAL_COMMUNITY)
Admission: EM | Admit: 2019-03-05 | Discharge: 2019-03-06 | Disposition: A | Discharge status: Home / Self Care | Payer: Medicaid Other | Source: Home / Self Care | Attending: Emergency Medicine | Admitting: Emergency Medicine

## 2019-03-05 ENCOUNTER — Encounter (HOSPITAL_COMMUNITY): Payer: Self-pay | Admitting: Emergency Medicine

## 2019-03-05 DIAGNOSIS — R112 Nausea with vomiting, unspecified: Secondary | ICD-10-CM

## 2019-03-05 DIAGNOSIS — D571 Sickle-cell disease without crisis: Secondary | ICD-10-CM | POA: Insufficient documentation

## 2019-03-05 DIAGNOSIS — G894 Chronic pain syndrome: Secondary | ICD-10-CM | POA: Diagnosis not present

## 2019-03-05 DIAGNOSIS — Z79899 Other long term (current) drug therapy: Secondary | ICD-10-CM | POA: Insufficient documentation

## 2019-03-05 DIAGNOSIS — R52 Pain, unspecified: Secondary | ICD-10-CM

## 2019-03-05 DIAGNOSIS — M545 Low back pain: Secondary | ICD-10-CM | POA: Insufficient documentation

## 2019-03-05 DIAGNOSIS — R1114 Bilious vomiting: Secondary | ICD-10-CM | POA: Diagnosis not present

## 2019-03-05 DIAGNOSIS — F1410 Cocaine abuse, uncomplicated: Secondary | ICD-10-CM | POA: Insufficient documentation

## 2019-03-05 DIAGNOSIS — Z87891 Personal history of nicotine dependence: Secondary | ICD-10-CM | POA: Insufficient documentation

## 2019-03-05 DIAGNOSIS — M5489 Other dorsalgia: Secondary | ICD-10-CM | POA: Diagnosis present

## 2019-03-05 MED ORDER — PROMETHAZINE HCL 25 MG PO TABS
25.0000 mg | ORAL_TABLET | Freq: Once | ORAL | Status: AC
  Administered 2019-03-05: 22:00:00 25 mg via ORAL
  Filled 2019-03-05: qty 1

## 2019-03-05 MED ORDER — ONDANSETRON 8 MG PO TBDP: 8.0000 mg | ORAL_TABLET | Freq: Three times a day (TID) | ORAL | Status: DC | PRN

## 2019-03-05 MED ORDER — OXYCODONE-ACETAMINOPHEN 5-325 MG PO TABS
1.0000 | ORAL_TABLET | Freq: Once | ORAL | Status: AC
  Administered 2019-03-05: 1 via ORAL
  Filled 2019-03-05: qty 1

## 2019-03-05 MED ORDER — KETOROLAC TROMETHAMINE 15 MG/ML IJ SOLN: 15.0000 mg | INTRAMUSCULAR | Status: DC

## 2019-03-05 MED ORDER — KETOROLAC TROMETHAMINE 15 MG/ML IJ SOLN
15.0000 mg | Freq: Once | INTRAMUSCULAR | Status: AC
  Administered 2019-03-05: 08:00:00 15 mg via INTRAMUSCULAR
  Filled 2019-03-05: qty 1

## 2019-03-05 MED ORDER — OXYCODONE HCL 5 MG PO TABS
30.0000 mg | ORAL_TABLET | Freq: Once | ORAL | Status: AC
  Administered 2019-03-05: 30 mg via ORAL
  Filled 2019-03-05: qty 6

## 2019-03-05 MED ORDER — PROMETHAZINE HCL 25 MG/ML IJ SOLN
25.0000 mg | Freq: Once | INTRAMUSCULAR | Status: AC
  Administered 2019-03-05: 25 mg via INTRAMUSCULAR
  Filled 2019-03-05: qty 1

## 2019-03-05 MED ORDER — HYDROMORPHONE HCL 2 MG/ML IJ SOLN: 2.0000 mg | INTRAMUSCULAR | Status: DC

## 2019-03-05 MED ORDER — DIPHENHYDRAMINE HCL 25 MG PO CAPS: 25.0000 mg | ORAL_CAPSULE | ORAL | Status: DC | PRN

## 2019-03-05 MED ORDER — PROMETHAZINE HCL 25 MG/ML IJ SOLN: 25.0000 mg | Freq: Once | INTRAMUSCULAR | Status: DC

## 2019-03-05 MED ORDER — ONDANSETRON 4 MG PO TBDP: 4.0000 mg | ORAL_TABLET | Freq: Three times a day (TID) | ORAL | 0 refills | Status: AC | PRN
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
## 2019-03-05 NOTE — ED Triage Notes (Signed)
He c/o low back pain radiating into bilat. Legs which he recognizes as his typical sickle cell pain. We observe much post. Right hand edema/induration, plus a linear open wound which begins in his right ant. Hand and progresses up his entire ant. Right forearm. It is chronic in appearance. He is ambulatory and in no distress. He tells [place] he normally is treated at [facility] for his sickle cell.

## 2019-03-05 NOTE — ED Notes (Addendum)
Pt requesting sandwich.  Pt notified that d/t his abdominal pain and nausea, we would need to wait until after the medication took effect before he ate anything.

## 2019-03-05 NOTE — Discharge Instructions (Signed)
Follow-up in the sickle cell clinic tomorrow.  Return emergency department for chest pain, difficulty breathing, any other worsening concerning symptoms.

## 2019-03-05 NOTE — ED Triage Notes (Signed)
Pt to triage via [facility].  PT seen at [facility] and [facility] today.  C/o pain to lower back and bilateral legs.  Pt was picked up at gas station.  Reports sharp pain x 3 days.  States he doesn't feel like it is his sickle cell crisis.
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
## 2019-03-05 NOTE — ED Triage Notes (Signed)
Pt arrives POV for eval of sickle cell pain. Pt has been seen 3 times today for same, this is 4th visit. Pt reports "just the same thing"

## 2019-03-05 NOTE — ED Notes (Signed)
Patient verbalizes understanding of discharge instructions. Opportunity for questioning and answers were provided. Armband removed by staff, pt discharged from ED to home 

## 2019-03-05 NOTE — Discharge Instructions (Signed)
As discussed, your evaluation today has been largely reassuring.  But, it is important that you monitor your condition carefully, and do not hesitate to return to the ED if you develop new, or concerning changes in your condition. ? ?Otherwise, please follow-up with your physician for appropriate ongoing care. ? ?
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
## 2019-03-05 NOTE — ED Triage Notes (Signed)
Pt was tx here last night for sickle cell crisis and discharged.  States back/stomach/all over body pain.  States was given a shot and discharged and it didn't help.  Also has R had pain and swelling.

## 2019-03-05 NOTE — Discharge Instructions (Addendum)
Call and follow up with your sickle cell specialist tomorrow for further care.

## 2019-03-06 ENCOUNTER — Encounter (HOSPITAL_COMMUNITY): Payer: Self-pay

## 2019-03-06 ENCOUNTER — Non-Acute Institutional Stay (HOSPITAL_BASED_OUTPATIENT_CLINIC_OR_DEPARTMENT_OTHER)
Admission: AD | Admit: 2019-03-06 | Discharge: 2019-03-06 | Disposition: A | Discharge status: Home / Self Care | Payer: Medicaid Other | Source: Ambulatory Visit | Attending: Internal Medicine | Admitting: Internal Medicine

## 2019-03-06 ENCOUNTER — Encounter (HOSPITAL_COMMUNITY): Payer: Self-pay | Admitting: Family Medicine

## 2019-03-06 ENCOUNTER — Emergency Department
Admission: EM | Admit: 2019-03-06 | Discharge: 2019-03-07 | Disposition: A | Discharge status: Home / Self Care | Payer: Medicaid Other | Source: Home / Self Care | Attending: Emergency Medicine | Admitting: Emergency Medicine

## 2019-03-06 ENCOUNTER — Emergency Department (HOSPITAL_COMMUNITY)
Admission: EM | Admit: 2019-03-06 | Discharge: 2019-03-06 | Disposition: A | Discharge status: Home / Self Care | Payer: Medicaid Other | Attending: Emergency Medicine | Admitting: Emergency Medicine

## 2019-03-06 ENCOUNTER — Emergency Department
Admission: EM | Admit: 2019-03-06 | Discharge: 2019-03-06 | Disposition: A | Discharge status: Home / Self Care | Payer: Medicaid Other | Attending: Emergency Medicine | Admitting: Emergency Medicine

## 2019-03-06 ENCOUNTER — Emergency Department (HOSPITAL_COMMUNITY): Payer: Medicaid Other

## 2019-03-06 ENCOUNTER — Other Ambulatory Visit: Payer: Self-pay

## 2019-03-06 ENCOUNTER — Encounter (HOSPITAL_COMMUNITY): Payer: Self-pay | Admitting: Emergency Medicine

## 2019-03-06 ENCOUNTER — Emergency Department (HOSPITAL_COMMUNITY)
Admission: EM | Admit: 2019-03-06 | Discharge: 2019-03-06 | Disposition: A | Discharge status: Home / Self Care | Payer: Medicaid Other | Source: Home / Self Care | Attending: Emergency Medicine | Admitting: Emergency Medicine

## 2019-03-06 DIAGNOSIS — Z79899 Other long term (current) drug therapy: Secondary | ICD-10-CM | POA: Insufficient documentation

## 2019-03-06 DIAGNOSIS — Z87891 Personal history of nicotine dependence: Secondary | ICD-10-CM | POA: Insufficient documentation

## 2019-03-06 DIAGNOSIS — G894 Chronic pain syndrome: Secondary | ICD-10-CM

## 2019-03-06 DIAGNOSIS — F1490 Cocaine use, unspecified, uncomplicated: Secondary | ICD-10-CM

## 2019-03-06 DIAGNOSIS — D5780 Other sickle-cell disorders without crisis: Secondary | ICD-10-CM | POA: Insufficient documentation

## 2019-03-06 DIAGNOSIS — M549 Dorsalgia, unspecified: Secondary | ICD-10-CM | POA: Diagnosis not present

## 2019-03-06 DIAGNOSIS — M791 Myalgia, unspecified site: Secondary | ICD-10-CM | POA: Diagnosis present

## 2019-03-06 DIAGNOSIS — D5700 Hb-SS disease with crisis, unspecified: Secondary | ICD-10-CM | POA: Insufficient documentation

## 2019-03-06 LAB — COMPREHENSIVE METABOLIC PANEL
ALT: 19 U/L (ref 0–44)
AST: 31 U/L (ref 15–41)
Albumin: 4.4 g/dL (ref 3.5–5.0)
Alkaline Phosphatase: 76 U/L (ref 38–126)
Anion gap: 8 (ref 5–15)
BUN: 8 mg/dL (ref 6–20)
CO2: 26 mmol/L (ref 22–32)
Calcium: 9.7 mg/dL (ref 8.9–10.3)
Chloride: 104 mmol/L (ref 98–111)
Creatinine, Ser: 0.54 mg/dL — ABNORMAL LOW (ref 0.61–1.24)
GFR calc Af Amer: >60 mL/min (ref >60)
GFR calc non Af Amer: >60 mL/min (ref >60)
Glucose, Bld: 97 mg/dL (ref 70–99)
Potassium: 4.2 mmol/L (ref 3.5–5.1)
Sodium: 138 mmol/L (ref 135–145)
Total Bilirubin: 4 mg/dL — ABNORMAL HIGH (ref 0.3–1.2)
Total Protein: 8.5 g/dL — ABNORMAL HIGH (ref 6.5–8.1)

## 2019-03-06 LAB — CBC WITH DIFFERENTIAL/PLATELET
Abs Immature Granulocytes: 0.08 10*3/uL — ABNORMAL HIGH (ref 0.00–0.07)
Basophils Absolute: 0.1 10*3/uL (ref 0.0–0.1)
Basophils Relative: 1 %
Eosinophils Absolute: 0.7 10*3/uL — ABNORMAL HIGH (ref 0.0–0.5)
Eosinophils Relative: 6 %
HCT: 23.6 % — ABNORMAL LOW (ref 39.0–52.0)
Hemoglobin: 7.9 g/dL — ABNORMAL LOW (ref 13.0–17.0)
Immature Granulocytes: 1 %
Lymphocytes Relative: 28 %
Lymphs Abs: 3.5 10*3/uL (ref 0.7–4.0)
MCH: 31.9 pg (ref 26.0–34.0)
MCHC: 33.5 g/dL (ref 30.0–36.0)
MCV: 95.2 fL (ref 80.0–100.0)
Monocytes Absolute: 1.7 10*3/uL — ABNORMAL HIGH (ref 0.1–1.0)
Monocytes Relative: 14 %
Neutro Abs: 6.4 10*3/uL (ref 1.7–7.7)
Neutrophils Relative %: 50 %
Platelets: 405 10*3/uL — ABNORMAL HIGH (ref 150–400)
RBC: 2.48 MIL/uL — ABNORMAL LOW (ref 4.22–5.81)
RDW: 18.6 % — ABNORMAL HIGH (ref 11.5–15.5)
WBC: 12.4 10*3/uL — ABNORMAL HIGH (ref 4.0–10.5)
nRBC: 0.6 % — ABNORMAL HIGH (ref 0.0–0.2)

## 2019-03-06 LAB — RETICULOCYTES
Immature Retic Fract: 30.8 % — ABNORMAL HIGH (ref 2.3–15.9)
RBC.: 2.44 MIL/uL — ABNORMAL LOW (ref 4.22–5.81)
Retic Count, Absolute: 299.4 10*3/uL — ABNORMAL HIGH (ref 19.0–186.0)
Retic Ct Pct: 12.3 % — ABNORMAL HIGH (ref 0.4–3.1)

## 2019-03-06 LAB — RAPID URINE DRUG SCREEN, HOSP PERFORMED
Amphetamines: NOT DETECTED
Barbiturates: NOT DETECTED
Benzodiazepines: NOT DETECTED
Cocaine: POSITIVE — AB
Opiates: POSITIVE — AB
Tetrahydrocannabinol: NOT DETECTED

## 2019-03-06 MED ORDER — OXYCODONE HCL 5 MG PO TABS
10.0000 mg | ORAL_TABLET | Freq: Once | ORAL | Status: AC
  Administered 2019-03-06: 10 mg via ORAL
  Filled 2019-03-06: qty 2

## 2019-03-06 MED ORDER — HYDROMORPHONE HCL 2 MG/ML IJ SOLN
2.0000 mg | Freq: Once | INTRAMUSCULAR | Status: AC
  Administered 2019-03-06: 11:00:00 2 mg via SUBCUTANEOUS
  Filled 2019-03-06: qty 1

## 2019-03-06 MED ORDER — OXYCODONE HCL ER 20 MG PO T12A
20.0000 mg | EXTENDED_RELEASE_TABLET | Freq: Once | ORAL | Status: AC
  Administered 2019-03-06: 20 mg via ORAL
  Filled 2019-03-06: qty 1

## 2019-03-06 MED ORDER — HYDROMORPHONE HCL 2 MG/ML IJ SOLN
2.0000 mg | Freq: Once | INTRAMUSCULAR | Status: AC
  Administered 2019-03-06: 2 mg via SUBCUTANEOUS
  Filled 2019-03-06: qty 1

## 2019-03-06 MED ORDER — ONDANSETRON HCL 4 MG/2ML IJ SOLN
4.0000 mg | INTRAMUSCULAR | Status: DC | PRN
  Administered 2019-03-06: 08:00:00 4 mg via INTRAVENOUS
  Filled 2019-03-06: qty 2

## 2019-03-06 MED ORDER — ALUM & MAG HYDROXIDE-SIMETH 200-200-20 MG/5ML PO SUSP
30.0000 mL | Freq: Once | ORAL | Status: AC
  Administered 2019-03-06: 30 mL via ORAL
  Filled 2019-03-06: qty 30

## 2019-03-06 MED ORDER — HYDROMORPHONE HCL 2 MG/ML IJ SOLN: 2.0000 mg | INTRAMUSCULAR | Status: DC

## 2019-03-06 MED ORDER — ACETAMINOPHEN 500 MG PO TABS
1000.0000 mg | ORAL_TABLET | Freq: Once | ORAL | Status: AC
  Administered 2019-03-06: 1000 mg via ORAL
  Filled 2019-03-06: qty 2

## 2019-03-06 MED ORDER — PROMETHAZINE HCL 25 MG/ML IJ SOLN
12.5000 mg | Freq: Once | INTRAMUSCULAR | Status: AC
  Administered 2019-03-06: 12.5 mg via INTRAVENOUS
  Filled 2019-03-06: qty 1

## 2019-03-06 MED ORDER — HYDROMORPHONE HCL 1 MG/ML IJ SOLN
1.0000 mg | Freq: Once | INTRAMUSCULAR | Status: AC
  Administered 2019-03-06: 1 mg via INTRAVENOUS
  Filled 2019-03-06: qty 1

## 2019-03-06 MED ORDER — IBUPROFEN 400 MG PO TABS: 600.0000 mg | ORAL_TABLET | Freq: Four times a day (QID) | ORAL | Status: DC | PRN

## 2019-03-06 MED ORDER — METOCLOPRAMIDE HCL 5 MG/ML IJ SOLN
10.0000 mg | Freq: Once | INTRAMUSCULAR | Status: DC
  Filled 2019-03-06: qty 2

## 2019-03-06 MED ORDER — KETOROLAC TROMETHAMINE 15 MG/ML IJ SOLN
15.0000 mg | INTRAMUSCULAR | Status: AC
  Administered 2019-03-06: 08:00:00 15 mg via INTRAVENOUS
  Filled 2019-03-06: qty 1

## 2019-03-06 MED ORDER — HYDROMORPHONE HCL 2 MG/ML IJ SOLN
2.0000 mg | INTRAMUSCULAR | Status: AC
  Administered 2019-03-06: 2 mg via INTRAVENOUS
  Filled 2019-03-06: qty 1

## 2019-03-06 MED ORDER — SODIUM CHLORIDE 0.45 % IV SOLN: INTRAVENOUS | Status: DC

## 2019-03-06 MED ORDER — DEXTROSE-NACL 5-0.45 % IV SOLN: INTRAVENOUS | Status: DC

## 2019-03-06 MED ORDER — HYOSCYAMINE SULFATE 0.125 MG SL SUBL
0.2500 mg | SUBLINGUAL_TABLET | Freq: Once | SUBLINGUAL | Status: AC
  Administered 2019-03-06: 02:00:00 0.25 mg via SUBLINGUAL
  Filled 2019-03-06: qty 2

## 2019-03-06 MED ORDER — LORAZEPAM 1 MG PO TABS
1.0000 mg | ORAL_TABLET | Freq: Once | ORAL | Status: AC
  Administered 2019-03-07: 1 mg via ORAL
  Filled 2019-03-06: qty 1

## 2019-03-06 NOTE — ED Triage Notes (Signed)
First RN Note: Pt presents to ED via [facility], c/o back and abd pain, was driving to [facility] when he stopped because he couldn't make it to [facility]. Per EMS upon EMS arrival pt was ordering food from [employer] in [place]. Per EMS VSS at this time.
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
## 2019-03-06 NOTE — Progress Notes (Signed)
Patient admitted to the day infusion hospital from [facility] for sickle cell pain.  Initially, patient reported lower back and abdominal pain rated 8/10. For pain management, patient given a total of 6 mg sub-q Dilaudid, 1 mg IV Dilaudid, 1000 mg Tylenol, 20 mg Oxycontin, 10 mg Oxycodone and patient hydrated with IV fluids. At discharge, patient rated pain at 7/10. Vital signs stable. Discharge instructions given. Patient alert, oriented and ambulatory at discharge.

## 2019-03-06 NOTE — ED Notes (Signed)
Pt returns to the er for sickle cell crisis. Pt has a steady gait and cadence of speech. Pt shows no obvious signs of pain. Pt was seen here earlier for same. Pt seen at [facility] earlier as well. MD notified.
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
## 2019-03-06 NOTE — ED Triage Notes (Signed)
Pt presents by [facility] for evaluation of sickle cell pain. Pt was seen at [facility] earlier for same. Pt reported pain was full body.

## 2019-03-06 NOTE — ED Triage Notes (Addendum)
Pt c/o having pain all over with N/V, states he has been in a sickle cell crisis for the past 3 days, pt is constantly texting on his phone in triage. Pt was seen at [facility] clinic today, pt states they normally get his labs drawn via ultrasound and request to wait til he is in a room to have them drawn

## 2019-03-06 NOTE — Discharge Instructions (Signed)
Patient will need to schedule first available appointment with PCP. Resume home medications. A referral has been sent to pain management. Also, a referral will be sent to neuropsychiatry.     Sickle Cell Anemia, Adult  Sickle cell anemia is a condition where your red blood cells are shaped like sickles. Red blood cells carry oxygen through the body. Sickle-shaped cells do not live as long as normal red blood cells. They also clump together and block blood from flowing through the blood vessels. This prevents the body from getting enough oxygen. Sickle cell anemia causes organ damage and pain. It also increases the risk of infection. Follow these instructions at home: Medicines  Take over-the-counter and prescription medicines only as told by your doctor.  If you were prescribed an antibiotic medicine, take it as told by your doctor. Do not stop taking the antibiotic even if you start to feel better.  If you develop a fever, do not take medicines to lower the fever right away. Tell your doctor about the fever. Managing pain, stiffness, and swelling  Try these methods to help with pain: ? Use a heating pad. ? Take a warm bath. ? Distract yourself, such as by watching TV. Eating and drinking  Drink enough fluid to keep your pee (urine) clear or pale yellow. Drink more in hot weather and during exercise.  Limit or avoid alcohol.  Eat a healthy diet. Eat plenty of fruits, vegetables, whole grains, and lean protein.  Take vitamins and supplements as told by your doctor. Traveling  When traveling, keep these with you: ? Your medical information. ? The names of your doctors. ? Your medicines.  If you need to take an airplane, talk to your doctor first. Activity  Rest often.  Avoid exercises that make your heart beat much faster, such as jogging. General instructions  Do not use products that have nicotine or tobacco, such as cigarettes and e-cigarettes. If you need help  quitting, ask your doctor.  Consider wearing a medical alert bracelet.  Avoid being in high places (high altitudes), such as mountains.  Avoid very hot or cold temperatures.  Avoid places where the temperature changes a lot.  Keep all follow-up visits as told by your doctor. This is important. Contact a doctor if:  A joint hurts.  Your feet or hands hurt or swell.  You feel tired (fatigued). Get help right away if:  You have symptoms of infection. These include: ? Fever. ? Chills. ? Being very tired. ? Irritability. ? Poor eating. ? Throwing up (vomiting).  You feel dizzy or faint.  You have new stomach pain, especially on the left side.  You have a an erection (priapism) that lasts more than 4 hours.  You have numbness in your arms or legs.  You have a hard time moving your arms or legs.  You have trouble talking.  You have pain that does not go away when you take medicine.  You are short of breath.  You are breathing fast.  You have a long-term cough.  You have pain in your chest.  You have a bad headache.  You have a stiff neck.  Your stomach looks bloated even though you did not eat much.  Your skin is pale.  You suddenly cannot see well. Summary  Sickle cell anemia is a condition where your red blood cells are shaped like sickles.  Follow your doctor's advice on ways to manage pain, food to eat, activities to do, and steps to  take for safe travel.  Get medical help right away if you have any signs of infection, such as a fever. This information is not intended to replace advice given to you by your health care provider. Make sure you discuss any questions you have with your health care provider. Document Revised: 05/20/2018 Document Reviewed: 03/03/2016 Elsevier Patient Education  2020 [employer]  

## 2019-03-06 NOTE — ED Triage Notes (Signed)
Pt returns to the er for earlier today for continued sickle cell pain. Pt also seen at [facility] and [facility] in the last 2 days. Pt reports pain all over. Pt states he is drinking fluid.

## 2019-03-06 NOTE — Discharge Instructions (Signed)
Go directly to the sickle cell clinic.  Return to the ED if he develop chest pain, shortness of breath or any concerns.

## 2019-03-06 NOTE — Discharge Instructions (Addendum)
Take your pain and nausea medicine that you have at home.  Do not use cocaine.  Return to the ER for worsening symptoms, persistent vomiting, difficulty breathing or other concerns.

## 2019-03-06 NOTE — ED Notes (Signed)
ED Provider at bedside. 

## 2019-03-06 NOTE — ED Triage Notes (Signed)
Pt states generalized sickle cell pain throughout body. Returns for the 6th time in 2 days.
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
## 2019-03-06 NOTE — Discharge Summary (Addendum)
[facility] Discharge Summary   Patient ID: Matthew Shannon MRN: [id number] DOB/AGE: 31/31/1992 31 y.o.  Admit date: 03/06/2019 Discharge date: 03/06/2019  Primary Care Physician:  [name], MD  Admission Diagnoses:  Active Problems:   Sickle cell pain crisis [facility])   Discharge Medications:  Allergies as of 03/06/2019      Reactions   Ceftriaxone Shortness Of Breath, Itching, Other (See Comments), Cough   Zosyn [piperacillin Sod-tazobactam So] Shortness Of Breath   SOB/rash/N/V Has patient had a PCN reaction causing immediate rash, facial/tongue/throat swelling, SOB or lightheadedness with hypotension: Y Has patient had a PCN reaction causing severe rash involving mucus membranes or skin necrosis: N Has patient had a PCN reaction that required hospitalization: Y Has patient had a PCN reaction occurring within the last 10 years: Y If all of the above answers are "NO", then may proceed with Cephalosporin use.   Azithromycin Itching      Medication List    TAKE these medications   acetaminophen 325 MG tablet Commonly known as: TYLENOL Take 650 mg by mouth every 6 (six) hours as needed for mild pain.   folic acid 1 MG tablet Commonly known as: FOLVITE Take 1 tablet (1 mg total) by mouth daily.   hydroxyurea 500 MG capsule Commonly known as: HYDREA Take 2 capsules (1,000 mg total) by mouth daily. May take with food to minimize GI side effects.   ibuprofen 800 MG tablet Commonly known as: ADVIL Take 1 tablet (800 mg total) by mouth every 8 (eight) hours as needed.   ondansetron 4 MG disintegrating tablet Commonly known as: ZOFRAN-ODT Take 1 tablet (4 mg total) by mouth every 8 (eight) hours as needed for nausea or vomiting.   promethazine 12.5 MG tablet Commonly known as: PHENERGAN Take 1 tablet (12.5 mg total) by mouth every 6 (six) hours as needed for nausea or vomiting.   promethazine 25 MG suppository Commonly known as: PHENERGAN Place 1  suppository (25 mg total) rectally every 6 (six) hours as needed for nausea or vomiting.        Consults:  None  Significant Diagnostic Studies:  DG Chest 2 View  Result Date: 02/18/2019 CLINICAL DATA:  Sickle cell crisis. Back pain. Shortness of breath. EXAM: CHEST - 2 VIEW COMPARISON:  February 04, 2019 FINDINGS: New opacity seen in the right base. Mild patchy opacity in left base. The heart, hila, mediastinum, lungs, and pleura are otherwise unremarkable. IMPRESSION: New opacities in the bases, right greater than left are nonspecific but may represent pneumonia. Recommend clinical correlation and short-term follow-up to ensure resolution. Electronically Signed   By: Gerome Sam III M.D   On: 02/18/2019 11:38   DG Chest Portable 1 View  Result Date: 03/06/2019 CLINICAL DATA:  Sickle cell disease. Weakness and chest pain. Lethargy. EXAM: PORTABLE CHEST 1 VIEW COMPARISON:  Multiple exams, including 02/25/2019 FINDINGS: Mild cardiomegaly. Mild bibasilar interstitial accentuation similar to the prior exam. Mild upper zone pulmonary vascular prominence. Mild S-shaped thoracolumbar scoliosis. Vertebral endplate findings compatible with sickle cell disease. Fullness of the hila potentially from pulmonary arterial hypertension or mild adenopathy. IMPRESSION: 1. Cardiomegaly with upper zone pulmonary vascular prominence, probably related to the patient's known sickle cell disease. 2. Chronic interstitial accentuation in the lung bases, right greater than left. 3. Endplate can cavities along the thoracic spine indicating osseous manifestations of sickle cell disease. Mild scoliosis. 4. Fullness of the hila potentially from pulmonary arterial hypertension or mild adenopathy. Electronically Signed  By: Van Clines M.D.   On: 03/06/2019 08:36   DG Chest Port 1 View  Result Date: 02/25/2019 CLINICAL DATA:  Chest pain. EXAM: PORTABLE CHEST 1 VIEW COMPARISON:  Sonia Side 10/2019. FINDINGS: The heart size  and mediastinal contours are within normal limits. No pneumothorax or pleural effusion is noted. Minimal to mild bibasilar opacities are noted concerning for subsegmental atelectasis or possibly pneumonia which are grossly stable. The visualized skeletal structures are unremarkable. IMPRESSION: Minimal to mild bibasilar opacities are noted concerning for subsegmental atelectasis or possibly pneumonia which are grossly stable. Electronically Signed   By: Marijo Conception M.D.   On: 02/25/2019 11:49    History of present illness:  Matthew Shannon, a 29 year old male with a medical history significant for sickle cell disease type SS, drug-seeking behavior, history of polysubstance abuse, chronic pain syndrome, opiate dependence and tolerance, and history of anemia of chronic disease presents complaining of pain primarily to low back and lower extremities.  Patient states that pain is consistent with his typical crisis.  Patient transitioned from the emergency department this a.m.  Agreed with ER provider that patient is appropriate to transition to day infusion center for pain management and extended observation.  It appears that patient was admitted to the emergency department on 03/04/2019, 03/05/2019 (3 encounters in 24 hours), and on today.  Patient received a prescription for oxycodone 30 mg #90 on 02/28/2019.  His daily MME's are 270.  Patient states that his pain has been uncontrolled on home medications.  Also, he states that he has been unable to keep medications down due to increased vomiting.  Even though patient is "vomiting" he says that he is taking medications consistently with no relief.  Patient has been complaining of vomiting over the past several weeks.  He was previously advised to follow-up with PCP for this problem.  Reviewed CT of abdomen and pelvis from 10/27/2018 at [place] which shows moderate volume of colonic stool gas and moderate gastric distention at that time but no  acute findings that supported abdominal pain and vomiting.   Patient states that he is not out of medications.  He currently denies use of illicit drugs.  He denies fever, chills, chest pain, shortness of breath, persistent cough, urinary symptoms, constipation, or diarrhea.  Patient endorses nausea and vomiting.  No sick contacts, recent travel, or exposure to COVID-19.   [facility] Course:  Patient admitted to sickle cell day infusion center for management of pain crisis.  He transition from the emergency department in stable condition. Reviewed all laboratory values, do not warrant repeating at this time.  Hemoglobin 7.9, consistent with patient's baseline, no clinical indication for blood transfusion at this time. Urine drug screen pending Vital signs reassuring. Pain managed with Dilaudid 2 mg SQ x3 doses Dilaudid 1 mg IV x1 dose OxyContin 20 mg x 1 dose Oxycodone 10 mg x 1 dose IV fluids, 0.45% saline at 100 mL/h Toradol 15 mg IV x1 dose Pain intensity decreased to 7/10.  Patient does not warrant admission at this time. Discussed medication management with patient at length.  A referral has been sent to pain management and neuropsychiatry for chronic pain syndrome, opiate dependence, and opiate tolerance. Discussed suboxone with patient, he says that it was not effective for him in the past.  Patient was admitted to the emergency department for 3 different encounters on 03/05/2019.  Patient returned to the emergency department this a.m for pain management.  Also,  patient recently received a prescription for oxycodone 30 mg #90 on 02/28/2019.  Explained to patient that this provider will no longer be able to write prescriptions for opiate medications at this time. Patient states that the problem is the fact that he cannot keep medications down despite taking promethazine 30 minutes prior to taking opiate medications.  Recommend promethazine suppositories to assist with this  problem.  Patient is not interested.  He is requesting admission.  Patient has had 4 admissions over the past 2 months for uncontrolled pain and inability to keep medications down. I also recommend that patient schedules an appointment with this provider in primary care he may warrant referral to gastroenterology for further work-up and evaluation of non-intractable vomiting.  Matthew Shannon is alert, oriented, and ambulating without assistance.  He will discharge home in a hemodynamically stable condition.    Discharge instructions: Resume all home medications.   Follow up with PCP as previously  scheduled.   Discussed the importance of drinking 64 ounces of water daily, dehydration of red blood cells may lead further sickling.   Avoid all stressors that precipitate sickle cell pain crisis.     The patient was given clear instructions to go to ER or return to medical center if symptoms do not improve, worsen or new problems develop.     Physical Exam at Discharge:  BP (!) 93/55 (BP Location: Left Arm)   Pulse 70   Temp 98.1 F (36.7 C) (Oral)   Resp 16   SpO2 100%    Disposition at Discharge: Discharge disposition: 01-Home or Self Care       Discharge Orders: Discharge Instructions    Discharge patient   Complete by: As directed    Discharge disposition: 01-Home or Self Care   Discharge patient date: 03/06/2019      Condition at Discharge:   Stable  Time spent on Discharge:  Greater than 30 minutes.  Signed: [name]  APRN, MSN, FNP-C Patient Care [facility] [address] [place], [place] [address] [phone]  03/06/2019, 4:43 PM

## 2019-03-06 NOTE — ED Notes (Signed)
Immediately after triage, pt asking [name], RN where cafeteria is, pt visualized ambulatory without distress or difficulty to the cafeteria to get food at this time.

## 2019-03-06 NOTE — ED Notes (Signed)
Discharge instructions reviewed with pt. Pt verbalized understanding.   

## 2019-03-07 ENCOUNTER — Other Ambulatory Visit: Payer: Self-pay

## 2019-03-07 ENCOUNTER — Emergency Department
Admission: EM | Admit: 2019-03-07 | Discharge: 2019-03-07 | Disposition: A | Discharge status: Home / Self Care | Payer: Medicaid Other | Attending: Emergency Medicine | Admitting: Emergency Medicine

## 2019-03-07 ENCOUNTER — Telehealth (HOSPITAL_COMMUNITY): Payer: Self-pay | Admitting: General Practice

## 2019-03-07 ENCOUNTER — Encounter: Payer: Self-pay | Admitting: Emergency Medicine

## 2019-03-07 DIAGNOSIS — R109 Unspecified abdominal pain: Secondary | ICD-10-CM | POA: Insufficient documentation

## 2019-03-07 DIAGNOSIS — M549 Dorsalgia, unspecified: Secondary | ICD-10-CM | POA: Diagnosis present

## 2019-03-07 DIAGNOSIS — R112 Nausea with vomiting, unspecified: Secondary | ICD-10-CM | POA: Diagnosis not present

## 2019-03-07 DIAGNOSIS — R52 Pain, unspecified: Secondary | ICD-10-CM

## 2019-03-07 DIAGNOSIS — Z87891 Personal history of nicotine dependence: Secondary | ICD-10-CM | POA: Insufficient documentation

## 2019-03-07 DIAGNOSIS — Z79899 Other long term (current) drug therapy: Secondary | ICD-10-CM | POA: Diagnosis not present

## 2019-03-07 MED ORDER — PROMETHAZINE HCL 25 MG RE SUPP: 25.0000 mg | Freq: Four times a day (QID) | RECTAL | 0 refills | Status: DC | PRN

## 2019-03-07 MED ORDER — ONDANSETRON 4 MG PO TBDP
4.0000 mg | ORAL_TABLET | Freq: Once | ORAL | Status: AC
  Administered 2019-03-07: 4 mg via ORAL
  Filled 2019-03-07: qty 1

## 2019-03-07 NOTE — Discharge Instructions (Addendum)
As we discussed please follow up with your sickle cell clinic today. Please seek medical attention for any high fevers, chest pain, shortness of breath, change in behavior, persistent vomiting, bloody stool or any other new or concerning symptoms.

## 2019-03-07 NOTE — ED Notes (Signed)
Pt assessed by provider 
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
## 2019-03-07 NOTE — ED Triage Notes (Signed)
Pt reports back pain, abd pain and nausea pain for a few days. Pt reports pain is consistent with his sickle cell.

## 2019-03-07 NOTE — Telephone Encounter (Signed)
Patient called from the [facility] ER, complained of pain in the back and stomach rated at 9/10. Denied chest pain, fever, diarrhea and vomitting. Endorsed nausea but took phenergan. Endorsed "all over" abdominal pain. Screened negative for Covid-19 symptoms. Would want transportation arranged by the in house social worker.  Last took 30 mg of Oxycodone "last night". Per provider, since patient is currently at the ER, has all over abdominal pain, the [facility] won't be able to take patient in today. Patient needs to remain at the ER to get worked up. Patient notified, verbalized understanding.

## 2019-03-13 ENCOUNTER — Telehealth: Payer: Self-pay | Admitting: Family Medicine

## 2019-03-13 ENCOUNTER — Other Ambulatory Visit: Payer: Self-pay | Admitting: Family Medicine

## 2019-03-13 DIAGNOSIS — R110 Nausea: Secondary | ICD-10-CM

## 2019-03-13 DIAGNOSIS — G894 Chronic pain syndrome: Secondary | ICD-10-CM

## 2019-03-13 MED ORDER — GENERIC EXTERNAL MEDICATION
Status: DC
Start: ? — End: 2019-03-13

## 2019-03-13 MED ORDER — POLYETHYLENE GLYCOL 3350 17 GM/SCOOP PO POWD
17.00 | ORAL | Status: DC
Start: 2019-03-22 — End: 2019-03-13

## 2019-03-13 MED ORDER — SODIUM CHLORIDE 0.9 % IV SOLN
INTRAVENOUS | Status: DC
Start: ? — End: 2019-03-13

## 2019-03-13 MED ORDER — PROMETHAZINE HCL 12.5 MG PO TABS: 12.5000 mg | ORAL_TABLET | Freq: Four times a day (QID) | ORAL | 2 refills | Status: AC | PRN

## 2019-03-13 MED ORDER — HYDROXYUREA 500 MG PO CAPS
1000.00 | ORAL_CAPSULE | ORAL | Status: DC
Start: 2019-03-22 — End: 2019-03-13

## 2019-03-13 MED ORDER — ACETAMINOPHEN 325 MG PO TABS
975.00 | ORAL_TABLET | ORAL | Status: DC
Start: 2019-03-21 — End: 2019-03-13

## 2019-03-13 MED ORDER — BACITRACIN-NEOMYCIN-POLYMYXIN 400-5-5000 EX OINT
TOPICAL_OINTMENT | CUTANEOUS | Status: DC
Start: 2019-03-15 — End: 2019-03-13

## 2019-03-13 MED ORDER — HYDROMORPHONE HCL-NACL 30-0.9 MG/30ML-% IJ SOSY
1.00 | PREFILLED_SYRINGE | INTRAMUSCULAR | Status: DC
Start: ? — End: 2019-03-13

## 2019-03-13 MED ORDER — GENERIC EXTERNAL MEDICATION
8.00 | Status: DC
Start: ? — End: 2019-03-13

## 2019-03-13 MED ORDER — OXYCODONE HCL 5 MG PO TABS
30.00 | ORAL_TABLET | ORAL | Status: DC
Start: 2019-03-15 — End: 2019-03-13

## 2019-03-13 MED ORDER — SENNOSIDES-DOCUSATE SODIUM 8.6-50 MG PO TABS
2.00 | ORAL_TABLET | ORAL | Status: DC
Start: 2019-03-21 — End: 2019-03-13

## 2019-03-13 MED ORDER — GABAPENTIN 300 MG PO CAPS: 300.0000 mg | ORAL_CAPSULE | Freq: Three times a day (TID) | ORAL | 3 refills | Status: AC

## 2019-03-13 MED ORDER — ENOXAPARIN SODIUM 40 MG/0.4ML ~~LOC~~ SOLN
40.00 | SUBCUTANEOUS | Status: DC
Start: 2019-03-22 — End: 2019-03-13

## 2019-03-13 MED ORDER — NALOXONE HCL 0.4 MG/ML IJ SOLN
0.40 | INTRAMUSCULAR | Status: DC
Start: ? — End: 2019-03-13

## 2019-03-13 MED ORDER — LIDOCAINE HCL 1 % IJ SOLN
0.50 | INTRAMUSCULAR | Status: DC
Start: ? — End: 2019-03-13

## 2019-03-13 MED ORDER — FOLIC ACID 1 MG PO TABS
1000.00 | ORAL_TABLET | ORAL | Status: DC
Start: 2019-03-22 — End: 2019-03-13

## 2019-03-13 NOTE — Telephone Encounter (Signed)
   [facility]   Notified patient, Matthew Shannon, a 29 year old male with a history of sickle cell disease, chronic pain syndrome, opiate dependence and tolerance, and history of polysubstance abuse to discuss opiate medication regimen.   Informed patient that he will no longer receive opiate medications from [facility]. Will continue to follow patient for all other primary care issues and health maintenance. Reviewed rapid urine drug screen from 03/06/2019 with patient. He says that he does not understand how it is positive for illicit substances to be in his urine. He denies drug use other than opiates.   Discussed findings with patient at length. Ibuprofen, gabapentin, and phenergan have been sent to patient's pharmacy. Also, there is a pending referral to pain management.   Will refrain from IV opiate medications in sickle cell day infusion center and will adhere to patient's care plan in the emergency department.   [name]  APRN, MSN, FNP-C [facility] [address] [place], [place] [address] [phone]

## 2019-03-13 NOTE — Progress Notes (Signed)
Orders Placed This Encounter  Procedures  . Ambulatory referral to Pain Clinic    Referral Priority:   Routine    Referral Type:   Consultation    Referral Reason:   Specialty Services Required    Requested Specialty:   Pain Medicine    Number of Visits Requested:   1  . Meds ordered this encounter  Medications  . gabapentin (NEURONTIN) 300 MG capsule    Sig: Take 1 capsule (300 mg total) by mouth 3 (three) times daily.    Dispense:  90 capsule    Refill:  3    Order Specific Question:   Supervising Provider    Answer:   [name] [[id number]]    [name]  APRN, MSN, FNP-C Patient Care [facility] [address] [place], [place] [address] [phone]

## 2019-03-14 NOTE — Telephone Encounter (Signed)
Sent to NP 

## 2019-03-15 ENCOUNTER — Telehealth: Payer: Self-pay | Admitting: Family Medicine

## 2019-03-15 MED ORDER — IBUPROFEN 600 MG PO TABS
600.00 | ORAL_TABLET | ORAL | Status: DC
Start: 2019-03-22 — End: 2019-03-15

## 2019-03-15 MED ORDER — GENERIC EXTERNAL MEDICATION
Status: DC
Start: 2019-03-15 — End: 2019-03-15

## 2019-03-15 MED ORDER — GENERIC EXTERNAL MEDICATION
Status: DC
Start: ? — End: 2019-03-15

## 2019-03-15 MED ORDER — HYDROMORPHONE HCL-NACL 30-0.9 MG/30ML-% IJ SOSY
0.80 | PREFILLED_SYRINGE | INTRAMUSCULAR | Status: DC
Start: ? — End: 2019-03-15

## 2019-03-15 NOTE — Telephone Encounter (Signed)
Pt has been Advised by NP Hart Rochester that no meds where to be filled Pt called this morning requesting med refills

## 2019-03-21 MED ORDER — MAGNESIUM OXIDE 400 MG PO TABS
400.00 | ORAL_TABLET | ORAL | Status: DC
Start: 2019-03-21 — End: 2019-03-21

## 2019-03-21 MED ORDER — GENERIC EXTERNAL MEDICATION
Status: DC
Start: ? — End: 2019-03-21

## 2019-03-21 MED ORDER — ONDANSETRON 4 MG PO TBDP
4.00 | ORAL_TABLET | ORAL | Status: DC
Start: ? — End: 2019-03-21

## 2019-03-21 MED ORDER — OXYCODONE HCL 5 MG/5ML PO SOLN
30.00 | ORAL | Status: DC
Start: 2019-03-21 — End: 2019-03-21

## 2019-03-22 MED ORDER — SODIUM CHLORIDE 0.9 % IV SOLN
100.00 | INTRAVENOUS | Status: DC
Start: ? — End: 2019-03-22

## 2019-03-22 MED ORDER — ENOXAPARIN SODIUM 40 MG/0.4ML ~~LOC~~ SOLN
40.00 | SUBCUTANEOUS | Status: DC
Start: 2019-03-23 — End: 2019-03-22

## 2019-03-22 MED ORDER — ONDANSETRON 4 MG PO TBDP
4.00 | ORAL_TABLET | ORAL | Status: DC
Start: ? — End: 2019-03-22

## 2019-03-22 MED ORDER — POLYETHYLENE GLYCOL 3350 17 GM/SCOOP PO POWD
17.00 | ORAL | Status: DC
Start: 2019-03-23 — End: 2019-03-22

## 2019-03-22 MED ORDER — GENERIC EXTERNAL MEDICATION
12.50 | Status: DC
Start: ? — End: 2019-03-22

## 2019-03-22 MED ORDER — NALOXONE HCL 0.4 MG/ML IJ SOLN
0.40 | INTRAMUSCULAR | Status: DC
Start: ? — End: 2019-03-22

## 2019-03-22 MED ORDER — GENERIC EXTERNAL MEDICATION
Status: DC
Start: ? — End: 2019-03-22

## 2019-03-22 MED ORDER — ACETAMINOPHEN 325 MG PO TABS
650.00 | ORAL_TABLET | ORAL | Status: DC
Start: ? — End: 2019-03-22

## 2019-03-22 MED ORDER — KETOROLAC TROMETHAMINE 30 MG/ML IJ SOLN
30.00 | INTRAMUSCULAR | Status: DC
Start: 2019-03-23 — End: 2019-03-22

## 2019-03-22 MED ORDER — DIPHENHYDRAMINE HCL 25 MG PO CAPS
25.00 | ORAL_CAPSULE | ORAL | Status: DC
Start: ? — End: 2019-03-22

## 2019-03-22 MED ORDER — OXYCODONE HCL 5 MG PO TABS
30.00 | ORAL_TABLET | ORAL | Status: DC
Start: ? — End: 2019-03-22

## 2019-03-24 ENCOUNTER — Inpatient Hospital Stay: Payer: Self-pay | Admitting: Nurse Practitioner

## 2019-03-31 MED ORDER — PROMETHAZINE HCL 25 MG/ML IJ SOLN
12.50 | INTRAMUSCULAR | Status: DC
Start: ? — End: 2019-03-31

## 2019-03-31 MED ORDER — OXYCODONE HCL 10 MG PO TABS
30.00 | ORAL_TABLET | ORAL | Status: DC
Start: ? — End: 2019-03-31

## 2019-03-31 MED ORDER — ACETAMINOPHEN 325 MG PO TABS
650.00 | ORAL_TABLET | ORAL | Status: DC
Start: ? — End: 2019-03-31

## 2019-03-31 MED ORDER — GENERIC EXTERNAL MEDICATION
2.00 | Status: DC
Start: 2019-04-03 — End: 2019-03-31

## 2019-03-31 MED ORDER — ENOXAPARIN SODIUM 40 MG/0.4ML ~~LOC~~ SOLN
40.00 | SUBCUTANEOUS | Status: DC
Start: 2019-04-04 — End: 2019-03-31

## 2019-03-31 MED ORDER — HYDROMORPHONE HCL 2 MG/ML IJ SOLN
2.00 | INTRAMUSCULAR | Status: DC
Start: ? — End: 2019-03-31

## 2019-03-31 MED ORDER — SELENIUM SULFIDE 1 % EX LOTN
TOPICAL_LOTION | CUTANEOUS | Status: DC
Start: ? — End: 2019-03-31

## 2019-03-31 MED ORDER — POLYETHYLENE GLYCOL 3350 17 GM/SCOOP PO POWD
17.00 | ORAL | Status: DC
Start: ? — End: 2019-03-31

## 2019-03-31 MED ORDER — SODIUM CHLORIDE 0.9 % IV SOLN
10.00 | INTRAVENOUS | Status: DC
Start: ? — End: 2019-03-31

## 2019-03-31 MED ORDER — FOLIC ACID 1 MG PO TABS
1.00 | ORAL_TABLET | ORAL | Status: DC
Start: 2019-04-04 — End: 2019-03-31

## 2019-03-31 MED ORDER — GUAIFENESIN 100 MG/5ML PO SYRP
200.00 | ORAL_SOLUTION | ORAL | Status: DC
Start: ? — End: 2019-03-31

## 2019-03-31 MED ORDER — LINEZOLID 600 MG PO TABS
600.00 | ORAL_TABLET | ORAL | Status: DC
Start: 2019-04-03 — End: 2019-03-31

## 2019-03-31 MED ORDER — GENERIC EXTERNAL MEDICATION
Status: DC
Start: ? — End: 2019-03-31

## 2019-03-31 MED ORDER — HYDROXYUREA 500 MG PO CAPS
1000.00 | ORAL_CAPSULE | ORAL | Status: DC
Start: 2019-04-04 — End: 2019-03-31

## 2019-03-31 MED ORDER — ALBUTEROL SULFATE HFA 108 (90 BASE) MCG/ACT IN AERS
2.00 | INHALATION_SPRAY | RESPIRATORY_TRACT | Status: DC
Start: ? — End: 2019-03-31

## 2019-03-31 MED ORDER — ONDANSETRON HCL 4 MG/2ML IJ SOLN
4.00 | INTRAMUSCULAR | Status: DC
Start: ? — End: 2019-03-31

## 2019-03-31 MED ORDER — DEXTROSE-SODIUM CHLORIDE 5-0.45 % IV SOLN
50.00 | INTRAVENOUS | Status: DC
Start: ? — End: 2019-03-31

## 2019-04-03 MED ORDER — HYDROMORPHONE HCL 2 MG PO TABS
2.00 | ORAL_TABLET | ORAL | Status: DC
Start: ? — End: 2019-04-03

## 2019-04-03 MED ORDER — GENERIC EXTERNAL MEDICATION
Status: DC
Start: ? — End: 2019-04-03

## 2019-04-03 MED ORDER — SODIUM CHLORIDE 0.9 % IV SOLN
10.00 | INTRAVENOUS | Status: DC
Start: ? — End: 2019-04-03

## 2019-04-17 MED ORDER — GENERIC EXTERNAL MEDICATION
6.00 | Status: DC
Start: 2019-04-17 — End: 2019-04-17

## 2019-04-17 MED ORDER — GENERIC EXTERNAL MEDICATION
Status: DC
Start: ? — End: 2019-04-17

## 2019-04-17 MED ORDER — SENNOSIDES 8.6 MG PO TABS
1.00 | ORAL_TABLET | ORAL | Status: DC
Start: 2019-04-18 — End: 2019-04-17

## 2019-04-17 MED ORDER — SODIUM CHLORIDE 0.9 % IV SOLN
10.00 | INTRAVENOUS | Status: DC
Start: ? — End: 2019-04-17

## 2019-04-17 MED ORDER — ACETAMINOPHEN 325 MG PO TABS
650.00 | ORAL_TABLET | ORAL | Status: DC
Start: ? — End: 2019-04-17

## 2019-04-17 MED ORDER — HYDROMORPHONE HCL 1 MG/ML IJ SOLN
1.00 | INTRAMUSCULAR | Status: DC
Start: ? — End: 2019-04-17

## 2019-04-17 MED ORDER — POLYETHYLENE GLYCOL 3350 17 GM/SCOOP PO POWD
17.00 | ORAL | Status: DC
Start: 2019-04-18 — End: 2019-04-17

## 2019-04-17 MED ORDER — ONDANSETRON HCL 4 MG/2ML IJ SOLN
4.00 | INTRAMUSCULAR | Status: DC
Start: ? — End: 2019-04-17

## 2019-04-17 MED ORDER — HEPARIN SODIUM (PORCINE) 5000 UNIT/ML IJ SOLN
5000.00 | INTRAMUSCULAR | Status: DC
Start: 2019-04-17 — End: 2019-04-17

## 2019-04-17 MED ORDER — PROMETHAZINE HCL 25 MG/ML IJ SOLN
12.50 | INTRAMUSCULAR | Status: DC
Start: ? — End: 2019-04-17

## 2019-04-17 MED ORDER — OXYCODONE HCL 10 MG PO TABS
30.00 | ORAL_TABLET | ORAL | Status: DC
Start: ? — End: 2019-04-17

## 2019-04-17 MED ORDER — ALBUTEROL SULFATE HFA 108 (90 BASE) MCG/ACT IN AERS
2.00 | INHALATION_SPRAY | RESPIRATORY_TRACT | Status: DC
Start: ? — End: 2019-04-17

## 2019-04-17 MED ORDER — IOPAMIDOL (ISOVUE-370) INJECTION 76%
100.00 | INTRAVENOUS | Status: DC
Start: ? — End: 2019-04-17

## 2019-04-17 MED ORDER — PROCHLORPERAZINE 25 MG RE SUPP
25.00 | RECTAL | Status: DC
Start: ? — End: 2019-04-17

## 2019-04-17 MED ORDER — THERA PO TABS
1.00 | ORAL_TABLET | ORAL | Status: DC
Start: 2019-04-18 — End: 2019-04-17

## 2019-04-25 ENCOUNTER — Other Ambulatory Visit (HOSPITAL_COMMUNITY)
Admission: AD | Admit: 2019-04-25 | Discharge: 2019-04-25 | Disposition: A | Discharge status: Home / Self Care | Payer: Medicaid Other | Source: Skilled Nursing Facility | Attending: Physician Assistant | Admitting: Physician Assistant

## 2019-04-25 DIAGNOSIS — I330 Acute and subacute infective endocarditis: Secondary | ICD-10-CM | POA: Insufficient documentation

## 2019-04-25 LAB — CBC WITH DIFFERENTIAL/PLATELET
Abs Immature Granulocytes: 0.24 10*3/uL — ABNORMAL HIGH (ref 0.00–0.07)
Basophils Absolute: 0.1 10*3/uL (ref 0.0–0.1)
Basophils Relative: 0 %
Eosinophils Absolute: 0.1 10*3/uL (ref 0.0–0.5)
Eosinophils Relative: 1 %
HCT: 25.7 % — ABNORMAL LOW (ref 39.0–52.0)
Hemoglobin: 8.5 g/dL — ABNORMAL LOW (ref 13.0–17.0)
Immature Granulocytes: 1 %
Lymphocytes Relative: 15 %
Lymphs Abs: 2.8 10*3/uL (ref 0.7–4.0)
MCH: 31.8 pg (ref 26.0–34.0)
MCHC: 33.1 g/dL (ref 30.0–36.0)
MCV: 96.3 fL (ref 80.0–100.0)
Monocytes Absolute: 1.5 10*3/uL — ABNORMAL HIGH (ref 0.1–1.0)
Monocytes Relative: 8 %
Neutro Abs: 14.4 10*3/uL — ABNORMAL HIGH (ref 1.7–7.7)
Neutrophils Relative %: 75 %
Platelets: 407 10*3/uL — ABNORMAL HIGH (ref 150–400)
RBC: 2.67 MIL/uL — ABNORMAL LOW (ref 4.22–5.81)
RDW: 22.2 % — ABNORMAL HIGH (ref 11.5–15.5)
WBC: 19.2 10*3/uL — ABNORMAL HIGH (ref 4.0–10.5)
nRBC: 2.8 % — ABNORMAL HIGH (ref 0.0–0.2)

## 2019-04-25 LAB — COMPREHENSIVE METABOLIC PANEL
ALT: 21 U/L (ref 0–44)
AST: 29 U/L (ref 15–41)
Albumin: 4.1 g/dL (ref 3.5–5.0)
Alkaline Phosphatase: 104 U/L (ref 38–126)
Anion gap: 11 (ref 5–15)
BUN: 7 mg/dL (ref 6–20)
CO2: 24 mmol/L (ref 22–32)
Calcium: 9.2 mg/dL (ref 8.9–10.3)
Chloride: 98 mmol/L (ref 98–111)
Creatinine, Ser: 0.58 mg/dL — ABNORMAL LOW (ref 0.61–1.24)
GFR calc Af Amer: >60 mL/min (ref >60)
GFR calc non Af Amer: >60 mL/min (ref >60)
Glucose, Bld: 79 mg/dL (ref 70–99)
Potassium: 4 mmol/L (ref 3.5–5.1)
Sodium: 133 mmol/L — ABNORMAL LOW (ref 135–145)
Total Bilirubin: 3 mg/dL — ABNORMAL HIGH (ref 0.3–1.2)
Total Protein: 8.5 g/dL — ABNORMAL HIGH (ref 6.5–8.1)

## 2019-04-25 LAB — SEDIMENTATION RATE: Sed Rate: 83 mm/hr — ABNORMAL HIGH (ref 0–16)

## 2019-04-25 LAB — C-REACTIVE PROTEIN: CRP: 2.4 mg/dL — ABNORMAL HIGH (ref <1.0)

## 2019-04-25 MED ORDER — ACETAMINOPHEN 325 MG PO TABS
650.00 | ORAL_TABLET | ORAL | Status: DC
Start: ? — End: 2019-04-25

## 2019-04-25 MED ORDER — HYDROMORPHONE HCL 2 MG/ML IJ SOLN
2.00 | INTRAMUSCULAR | Status: DC
Start: ? — End: 2019-04-25

## 2019-04-25 MED ORDER — POLYETHYLENE GLYCOL 3350 17 GM/SCOOP PO POWD
17.00 | ORAL | Status: DC
Start: ? — End: 2019-04-25

## 2019-04-25 MED ORDER — GENERIC EXTERNAL MEDICATION
Status: DC
Start: ? — End: 2019-04-25

## 2019-04-25 MED ORDER — HYDROXYUREA 500 MG PO CAPS
1000.00 | ORAL_CAPSULE | ORAL | Status: DC
Start: 2019-04-26 — End: 2019-04-25

## 2019-04-25 MED ORDER — SODIUM CHLORIDE 0.9 % IV SOLN
10.00 | INTRAVENOUS | Status: DC
Start: ? — End: 2019-04-25

## 2019-04-25 MED ORDER — PROMETHAZINE HCL 25 MG/ML IJ SOLN
12.50 | INTRAMUSCULAR | Status: DC
Start: ? — End: 2019-04-25

## 2019-04-25 MED ORDER — ALUM & MAG HYDROXIDE-SIMETH 200-200-20 MG/5ML PO SUSP
20.00 | ORAL | Status: DC
Start: ? — End: 2019-04-25

## 2019-04-25 MED ORDER — GENERIC EXTERNAL MEDICATION
2.00 | Status: DC
Start: 2019-04-25 — End: 2019-04-25

## 2019-04-25 MED ORDER — DIPHENHYDRAMINE HCL 25 MG PO CAPS
25.00 | ORAL_CAPSULE | ORAL | Status: DC
Start: ? — End: 2019-04-25

## 2019-04-25 MED ORDER — BENZONATATE 100 MG PO CAPS
100.00 | ORAL_CAPSULE | ORAL | Status: DC
Start: ? — End: 2019-04-25

## 2019-04-25 MED ORDER — FOLIC ACID 1 MG PO TABS
1.00 | ORAL_TABLET | ORAL | Status: DC
Start: 2019-04-26 — End: 2019-04-25

## 2019-04-25 MED ORDER — MUPIROCIN 2 % EX OINT
TOPICAL_OINTMENT | CUTANEOUS | Status: DC
Start: 2019-04-25 — End: 2019-04-25

## 2019-06-19 MED ORDER — ACETAMINOPHEN 325 MG PO TABS
975.00 | ORAL_TABLET | ORAL | Status: DC
Start: 2019-06-19 — End: 2019-06-19

## 2019-06-19 MED ORDER — SENNOSIDES-DOCUSATE SODIUM 8.6-50 MG PO TABS
2.00 | ORAL_TABLET | ORAL | Status: DC
Start: 2019-06-19 — End: 2019-06-19

## 2019-06-19 MED ORDER — PROMETHAZINE HCL 25 MG PO TABS
12.50 | ORAL_TABLET | ORAL | Status: DC
Start: ? — End: 2019-06-19

## 2019-06-19 MED ORDER — DICLOFENAC EPOLAMINE 1.3 % EX PTCH
1.00 | MEDICATED_PATCH | CUTANEOUS | Status: DC
Start: 2019-06-19 — End: 2019-06-19

## 2019-06-19 MED ORDER — LIDOCAINE HCL 1 % IJ SOLN
0.50 | INTRAMUSCULAR | Status: DC
Start: ? — End: 2019-06-19

## 2019-06-19 MED ORDER — OXYCODONE HCL 5 MG PO TABS
30.00 | ORAL_TABLET | ORAL | Status: DC
Start: ? — End: 2019-06-19

## 2019-06-19 MED ORDER — HYDROXYUREA 500 MG PO CAPS
1000.00 | ORAL_CAPSULE | ORAL | Status: DC
Start: 2019-06-20 — End: 2019-06-19

## 2019-06-19 MED ORDER — PROMETHAZINE HCL 25 MG RE SUPP
25.00 | RECTAL | Status: DC
Start: ? — End: 2019-06-19

## 2019-06-19 MED ORDER — FOLIC ACID 1 MG PO TABS
1000.00 | ORAL_TABLET | ORAL | Status: DC
Start: 2019-06-20 — End: 2019-06-19

## 2019-06-19 MED ORDER — CALCIUM CARBONATE ANTACID 750 MG PO CHEW
CHEWABLE_TABLET | ORAL | Status: DC
Start: 2019-06-19 — End: 2019-06-19

## 2019-06-19 MED ORDER — ENOXAPARIN SODIUM 40 MG/0.4ML ~~LOC~~ SOLN
40.00 | SUBCUTANEOUS | Status: DC
Start: 2019-06-20 — End: 2019-06-19

## 2019-06-19 MED ORDER — POLYETHYLENE GLYCOL 3350 17 GM/SCOOP PO POWD
17.00 | ORAL | Status: DC
Start: 2019-06-20 — End: 2019-06-19

## 2019-07-11 DEATH — deceased

## 2019-08-17 NOTE — Telephone Encounter (Signed)
In error
